# Patient Record
Sex: Female | Born: 1935
Health system: Southern US, Community
[De-identification: ages and names within clinical notes are randomized; demographics above are authoritative.]

## PROBLEM LIST (undated history)

## (undated) DIAGNOSIS — C4922 Malignant neoplasm of connective and soft tissue of left lower limb, including hip: Secondary | ICD-10-CM

## (undated) DIAGNOSIS — D649 Anemia, unspecified: Secondary | ICD-10-CM

## (undated) DIAGNOSIS — N390 Urinary tract infection, site not specified: Secondary | ICD-10-CM

## (undated) DIAGNOSIS — Z860100 Personal history of colon polyps, unspecified: Secondary | ICD-10-CM

## (undated) DIAGNOSIS — I10 Essential (primary) hypertension: Secondary | ICD-10-CM

## (undated) DIAGNOSIS — H269 Unspecified cataract: Secondary | ICD-10-CM

## (undated) DIAGNOSIS — Z923 Personal history of irradiation: Secondary | ICD-10-CM

## (undated) DIAGNOSIS — K219 Gastro-esophageal reflux disease without esophagitis: Secondary | ICD-10-CM

## (undated) DIAGNOSIS — Z5189 Encounter for other specified aftercare: Secondary | ICD-10-CM

## (undated) DIAGNOSIS — Q600 Renal agenesis, unilateral: Secondary | ICD-10-CM

## (undated) DIAGNOSIS — R32 Unspecified urinary incontinence: Secondary | ICD-10-CM

## (undated) DIAGNOSIS — M545 Low back pain, unspecified: Secondary | ICD-10-CM

## (undated) DIAGNOSIS — R569 Unspecified convulsions: Secondary | ICD-10-CM

## (undated) DIAGNOSIS — Z7901 Long term (current) use of anticoagulants: Secondary | ICD-10-CM

## (undated) DIAGNOSIS — K5790 Diverticulosis of intestine, part unspecified, without perforation or abscess without bleeding: Secondary | ICD-10-CM

## (undated) DIAGNOSIS — G5600 Carpal tunnel syndrome, unspecified upper limb: Secondary | ICD-10-CM

## (undated) DIAGNOSIS — D126 Benign neoplasm of colon, unspecified: Secondary | ICD-10-CM

## (undated) DIAGNOSIS — Z86718 Personal history of other venous thrombosis and embolism: Secondary | ICD-10-CM

## (undated) DIAGNOSIS — B009 Herpesviral infection, unspecified: Secondary | ICD-10-CM

## (undated) DIAGNOSIS — Z8619 Personal history of other infectious and parasitic diseases: Secondary | ICD-10-CM

## (undated) DIAGNOSIS — R197 Diarrhea, unspecified: Secondary | ICD-10-CM

## (undated) DIAGNOSIS — I742 Embolism and thrombosis of arteries of the upper extremities: Secondary | ICD-10-CM

## (undated) DIAGNOSIS — I7300 Raynaud's syndrome without gangrene: Secondary | ICD-10-CM

## (undated) DIAGNOSIS — J40 Bronchitis, not specified as acute or chronic: Secondary | ICD-10-CM

## (undated) DIAGNOSIS — M1990 Unspecified osteoarthritis, unspecified site: Secondary | ICD-10-CM

## (undated) DIAGNOSIS — R351 Nocturia: Secondary | ICD-10-CM

## (undated) DIAGNOSIS — IMO0001 Reserved for inherently not codable concepts without codable children: Secondary | ICD-10-CM

## (undated) DIAGNOSIS — E785 Hyperlipidemia, unspecified: Secondary | ICD-10-CM

## (undated) DIAGNOSIS — G4700 Insomnia, unspecified: Secondary | ICD-10-CM

## (undated) DIAGNOSIS — K589 Irritable bowel syndrome without diarrhea: Secondary | ICD-10-CM

## (undated) HISTORY — DX: Long term (current) use of anticoagulants: Z79.01

## (undated) HISTORY — PX: BACK SURGERY: SHX140

## (undated) HISTORY — DX: Diarrhea, unspecified: R19.7

## (undated) HISTORY — DX: Renal agenesis, unilateral: Q60.0

## (undated) HISTORY — PX: LUMBAR LAMINECTOMY: SHX95

## (undated) HISTORY — DX: Insomnia, unspecified: G47.00

## (undated) HISTORY — PX: KNEE SURGERY: SHX244

## (undated) HISTORY — DX: Herpesviral infection, unspecified: B00.9

## (undated) HISTORY — PX: BREAST SURGERY: SHX581

## (undated) HISTORY — PX: APPENDECTOMY: SHX54

## (undated) HISTORY — DX: Personal history of other venous thrombosis and embolism: Z86.718

## (undated) HISTORY — DX: Benign neoplasm of colon, unspecified: D12.6

## (undated) HISTORY — DX: Low back pain, unspecified: M54.50

## (undated) HISTORY — DX: Low back pain: M54.5

## (undated) HISTORY — DX: Essential (primary) hypertension: I10

## (undated) HISTORY — DX: Encounter for other specified aftercare: Z51.89

## (undated) HISTORY — DX: Irritable bowel syndrome, unspecified: K58.9

## (undated) HISTORY — PX: COLONOSCOPY: SHX174

## (undated) HISTORY — DX: Anemia, unspecified: D64.9

## (undated) HISTORY — DX: Hyperlipidemia, unspecified: E78.5

## (undated) HISTORY — PX: CHOLECYSTECTOMY: SHX55

## (undated) HISTORY — DX: Reserved for inherently not codable concepts without codable children: IMO0001

## (undated) HISTORY — DX: Gastro-esophageal reflux disease without esophagitis: K21.9

## (undated) HISTORY — PX: SHOULDER SURGERY: SHX246

## (undated) HISTORY — PX: ABDOMINAL HYSTERECTOMY: SHX81

## (undated) HISTORY — DX: Urinary tract infection, site not specified: N39.0

## (undated) HISTORY — DX: Malignant neoplasm of connective and soft tissue of left lower limb, including hip: C49.22

## (undated) HISTORY — DX: Embolism and thrombosis of arteries of the upper extremities: I74.2

---

## 1957-06-16 HISTORY — PX: ECTOPIC PREGNANCY SURGERY: SHX613

## 1986-06-16 HISTORY — PX: OTHER SURGICAL HISTORY: SHX169

## 1996-06-16 DIAGNOSIS — Z86718 Personal history of other venous thrombosis and embolism: Secondary | ICD-10-CM

## 1996-06-16 HISTORY — DX: Personal history of other venous thrombosis and embolism: Z86.718

## 1998-09-26 ENCOUNTER — Other Ambulatory Visit: Admission: RE | Admit: 1998-09-26 | Discharge: 1998-09-26 | Payer: Self-pay | Admitting: Obstetrics and Gynecology

## 2001-01-19 ENCOUNTER — Other Ambulatory Visit: Admission: RE | Admit: 2001-01-19 | Discharge: 2001-01-19 | Payer: Self-pay | Admitting: Obstetrics and Gynecology

## 2003-11-20 ENCOUNTER — Encounter: Admission: RE | Admit: 2003-11-20 | Discharge: 2003-11-20 | Payer: Self-pay | Admitting: Obstetrics and Gynecology

## 2003-12-25 ENCOUNTER — Encounter (HOSPITAL_COMMUNITY): Admission: RE | Admit: 2003-12-25 | Discharge: 2004-01-02 | Payer: Self-pay | Admitting: General Surgery

## 2004-05-13 ENCOUNTER — Ambulatory Visit: Payer: Self-pay | Admitting: Oncology

## 2004-06-24 ENCOUNTER — Emergency Department (HOSPITAL_COMMUNITY): Admission: EM | Admit: 2004-06-24 | Discharge: 2004-06-24 | Payer: Self-pay | Admitting: *Deleted

## 2004-07-09 ENCOUNTER — Ambulatory Visit: Payer: Self-pay | Admitting: Oncology

## 2004-08-26 ENCOUNTER — Ambulatory Visit: Payer: Self-pay | Admitting: Oncology

## 2004-11-01 ENCOUNTER — Ambulatory Visit: Payer: Self-pay | Admitting: Oncology

## 2004-12-27 ENCOUNTER — Ambulatory Visit: Payer: Self-pay | Admitting: Oncology

## 2005-02-21 ENCOUNTER — Ambulatory Visit: Payer: Self-pay | Admitting: Oncology

## 2005-04-18 ENCOUNTER — Ambulatory Visit: Payer: Self-pay | Admitting: Oncology

## 2005-06-19 ENCOUNTER — Ambulatory Visit: Payer: Self-pay | Admitting: Internal Medicine

## 2005-07-18 ENCOUNTER — Ambulatory Visit: Payer: Self-pay | Admitting: Oncology

## 2005-11-06 ENCOUNTER — Ambulatory Visit: Payer: Self-pay | Admitting: Oncology

## 2005-11-11 LAB — CBC WITH DIFFERENTIAL/PLATELET
Basophils Absolute: 0.1 10*3/uL (ref 0.0–0.1)
Eosinophils Absolute: 0.1 10*3/uL (ref 0.0–0.5)
HCT: 36.2 % (ref 34.8–46.6)
HGB: 12.3 g/dL (ref 11.6–15.9)
MONO#: 0.7 10*3/uL (ref 0.1–0.9)
NEUT%: 51.9 % (ref 39.6–76.8)
WBC: 8 10*3/uL (ref 3.9–10.0)
lymph#: 3 10*3/uL (ref 0.9–3.3)

## 2005-11-11 LAB — PROTIME-INR

## 2006-01-01 ENCOUNTER — Ambulatory Visit: Payer: Self-pay | Admitting: Oncology

## 2006-01-14 LAB — CBC WITH DIFFERENTIAL/PLATELET
Basophils Absolute: 0.1 10*3/uL (ref 0.0–0.1)
EOS%: 0.6 % (ref 0.0–7.0)
HCT: 36.4 % (ref 34.8–46.6)
HGB: 12.6 g/dL (ref 11.6–15.9)
MCH: 34 pg (ref 26.0–34.0)
MCHC: 34.6 g/dL (ref 32.0–36.0)
MCV: 98.4 fL (ref 81.0–101.0)
MONO%: 6.3 % (ref 0.0–13.0)
NEUT%: 61.7 % (ref 39.6–76.8)
RDW: 11.9 % (ref 11.3–14.5)

## 2006-01-28 LAB — PROTIME-INR
INR: 2.2 (ref 2.00–3.50)
Protime: 26.4 Seconds — ABNORMAL HIGH (ref 10.6–13.4)

## 2006-02-26 ENCOUNTER — Ambulatory Visit: Payer: Self-pay | Admitting: Oncology

## 2006-04-30 ENCOUNTER — Encounter: Admission: RE | Admit: 2006-04-30 | Discharge: 2006-04-30 | Payer: Self-pay | Admitting: General Surgery

## 2006-05-08 ENCOUNTER — Ambulatory Visit: Payer: Self-pay | Admitting: Oncology

## 2006-05-12 LAB — CBC WITH DIFFERENTIAL/PLATELET
BASO%: 0.5 % (ref 0.0–2.0)
LYMPH%: 27.5 % (ref 14.0–48.0)
MCHC: 33.8 g/dL (ref 32.0–36.0)
MONO#: 0.7 10*3/uL (ref 0.1–0.9)
Platelets: 263 10*3/uL (ref 145–400)
RBC: 3.57 10*6/uL — ABNORMAL LOW (ref 3.70–5.32)
WBC: 8.8 10*3/uL (ref 3.9–10.0)
lymph#: 2.4 10*3/uL (ref 0.9–3.3)

## 2006-05-12 LAB — PROTIME-INR: INR: 2.8 (ref 2.00–3.50)

## 2006-07-01 ENCOUNTER — Ambulatory Visit: Payer: Self-pay | Admitting: Internal Medicine

## 2006-07-06 ENCOUNTER — Encounter: Payer: Self-pay | Admitting: Internal Medicine

## 2006-08-04 ENCOUNTER — Ambulatory Visit: Payer: Self-pay | Admitting: Internal Medicine

## 2006-08-04 LAB — CONVERTED CEMR LAB
Sed Rate: 19 mm/hr (ref 0–25)
Tissue Transglutaminase Ab, IgA: <3 units (ref <5)

## 2006-08-05 ENCOUNTER — Encounter (INDEPENDENT_AMBULATORY_CARE_PROVIDER_SITE_OTHER): Payer: Self-pay | Admitting: Specialist

## 2006-08-05 ENCOUNTER — Ambulatory Visit: Payer: Self-pay | Admitting: Internal Medicine

## 2006-09-17 ENCOUNTER — Ambulatory Visit: Payer: Self-pay | Admitting: Oncology

## 2006-09-22 LAB — COMPREHENSIVE METABOLIC PANEL
ALT: 8 U/L (ref 0–35)
BUN: 15 mg/dL (ref 6–23)
CO2: 22 mEq/L (ref 19–32)
Creatinine, Ser: 0.93 mg/dL (ref 0.40–1.20)
Total Bilirubin: 0.4 mg/dL (ref 0.3–1.2)

## 2006-09-22 LAB — CBC WITH DIFFERENTIAL/PLATELET
BASO%: 0.3 % (ref 0.0–2.0)
Eosinophils Absolute: 0.1 10*3/uL (ref 0.0–0.5)
LYMPH%: 29.9 % (ref 14.0–48.0)
MCHC: 35 g/dL (ref 32.0–36.0)
MCV: 98.2 fL (ref 81.0–101.0)
MONO%: 7.8 % (ref 0.0–13.0)
NEUT%: 60.8 % (ref 39.6–76.8)
Platelets: 263 10*3/uL (ref 145–400)
RBC: 3.44 10*6/uL — ABNORMAL LOW (ref 3.70–5.32)

## 2006-09-22 LAB — LACTATE DEHYDROGENASE: LDH: 132 U/L (ref 94–250)

## 2006-09-22 LAB — PROTIME-INR: Protime: 28.8 Seconds — ABNORMAL HIGH (ref 10.6–13.4)

## 2006-09-30 ENCOUNTER — Ambulatory Visit: Payer: Self-pay | Admitting: Internal Medicine

## 2006-11-13 ENCOUNTER — Ambulatory Visit: Payer: Self-pay | Admitting: Oncology

## 2006-11-17 LAB — PROTIME-INR: Protime: 24 Seconds — ABNORMAL HIGH (ref 10.6–13.4)

## 2006-11-17 LAB — CBC WITH DIFFERENTIAL/PLATELET
BASO%: 0.7 % (ref 0.0–2.0)
Basophils Absolute: 0 10*3/uL (ref 0.0–0.1)
Eosinophils Absolute: 0.1 10*3/uL (ref 0.0–0.5)
HCT: 36.2 % (ref 34.8–46.6)
HGB: 12.1 g/dL (ref 11.6–15.9)
LYMPH%: 30 % (ref 14.0–48.0)
MCHC: 33.6 g/dL (ref 32.0–36.0)
MONO#: 0.7 10*3/uL (ref 0.1–0.9)
NEUT%: 58.7 % (ref 39.6–76.8)
Platelets: 279 10*3/uL (ref 145–400)
WBC: 6.9 10*3/uL (ref 3.9–10.0)
lymph#: 2.1 10*3/uL (ref 0.9–3.3)

## 2006-12-21 LAB — PROTIME-INR: Protime: 27.6 Seconds — ABNORMAL HIGH (ref 10.6–13.4)

## 2007-01-15 ENCOUNTER — Ambulatory Visit: Payer: Self-pay | Admitting: Oncology

## 2007-01-27 LAB — CBC WITH DIFFERENTIAL/PLATELET
Eosinophils Absolute: 0.1 10*3/uL (ref 0.0–0.5)
LYMPH%: 30.1 % (ref 14.0–48.0)
MCHC: 34.5 g/dL (ref 32.0–36.0)
MCV: 102 fL — ABNORMAL HIGH (ref 81.0–101.0)
MONO%: 8.5 % (ref 0.0–13.0)
NEUT#: 4.8 10*3/uL (ref 1.5–6.5)
NEUT%: 59.6 % (ref 39.6–76.8)
Platelets: 276 10*3/uL (ref 145–400)
RBC: 3.3 10*6/uL — ABNORMAL LOW (ref 3.70–5.32)

## 2007-01-27 LAB — PROTIME-INR: Protime: 32.4 Seconds — ABNORMAL HIGH (ref 10.6–13.4)

## 2007-03-18 ENCOUNTER — Ambulatory Visit: Payer: Self-pay | Admitting: Internal Medicine

## 2007-03-18 ENCOUNTER — Ambulatory Visit: Payer: Self-pay | Admitting: Oncology

## 2007-03-18 DIAGNOSIS — R197 Diarrhea, unspecified: Secondary | ICD-10-CM

## 2007-03-23 DIAGNOSIS — I10 Essential (primary) hypertension: Secondary | ICD-10-CM

## 2007-03-23 DIAGNOSIS — M545 Low back pain: Secondary | ICD-10-CM | POA: Insufficient documentation

## 2007-03-23 DIAGNOSIS — G8929 Other chronic pain: Secondary | ICD-10-CM | POA: Insufficient documentation

## 2007-03-23 DIAGNOSIS — Z86718 Personal history of other venous thrombosis and embolism: Secondary | ICD-10-CM

## 2007-03-23 LAB — COMPREHENSIVE METABOLIC PANEL
ALT: 10 U/L (ref 0–35)
AST: 15 U/L (ref 0–37)
Creatinine, Ser: 0.93 mg/dL (ref 0.40–1.20)
Total Bilirubin: 0.4 mg/dL (ref 0.3–1.2)

## 2007-03-23 LAB — CBC WITH DIFFERENTIAL/PLATELET
Basophils Absolute: 0 10*3/uL (ref 0.0–0.1)
Eosinophils Absolute: 0.1 10*3/uL (ref 0.0–0.5)
HCT: 35 % (ref 34.8–46.6)
LYMPH%: 37.1 % (ref 14.0–48.0)
MCV: 100.6 fL (ref 81.0–101.0)
MONO%: 10 % (ref 0.0–13.0)
NEUT#: 3.2 10*3/uL (ref 1.5–6.5)
NEUT%: 51.5 % (ref 39.6–76.8)
Platelets: 298 10*3/uL (ref 145–400)
RBC: 3.48 10*6/uL — ABNORMAL LOW (ref 3.70–5.32)

## 2007-03-23 LAB — LACTATE DEHYDROGENASE: LDH: 148 U/L (ref 94–250)

## 2007-04-29 ENCOUNTER — Ambulatory Visit: Payer: Self-pay | Admitting: Internal Medicine

## 2007-05-12 ENCOUNTER — Ambulatory Visit: Payer: Self-pay | Admitting: Oncology

## 2007-06-24 ENCOUNTER — Ambulatory Visit: Payer: Self-pay | Admitting: Oncology

## 2007-06-28 LAB — CBC WITH DIFFERENTIAL/PLATELET
Basophils Absolute: 0 10*3/uL (ref 0.0–0.1)
EOS%: 0.8 % (ref 0.0–7.0)
Eosinophils Absolute: 0.1 10*3/uL (ref 0.0–0.5)
HCT: 32.4 % — ABNORMAL LOW (ref 34.8–46.6)
HGB: 11.4 g/dL — ABNORMAL LOW (ref 11.6–15.9)
MCH: 34.8 pg — ABNORMAL HIGH (ref 26.0–34.0)
MCV: 98.8 fL (ref 81.0–101.0)
NEUT%: 62.9 % (ref 39.6–76.8)
lymph#: 2.1 10*3/uL (ref 0.9–3.3)

## 2007-06-28 LAB — PROTIME-INR

## 2007-08-04 ENCOUNTER — Encounter: Payer: Self-pay | Admitting: Internal Medicine

## 2007-09-03 ENCOUNTER — Ambulatory Visit: Payer: Self-pay | Admitting: Oncology

## 2007-09-21 ENCOUNTER — Ambulatory Visit: Payer: Self-pay | Admitting: Internal Medicine

## 2007-09-21 DIAGNOSIS — K219 Gastro-esophageal reflux disease without esophagitis: Secondary | ICD-10-CM

## 2007-10-29 ENCOUNTER — Ambulatory Visit: Payer: Self-pay | Admitting: Oncology

## 2007-11-06 ENCOUNTER — Emergency Department (HOSPITAL_COMMUNITY): Admission: EM | Admit: 2007-11-06 | Discharge: 2007-11-06 | Payer: Self-pay | Admitting: Emergency Medicine

## 2007-12-23 ENCOUNTER — Ambulatory Visit: Payer: Self-pay | Admitting: Oncology

## 2007-12-28 LAB — CBC WITH DIFFERENTIAL/PLATELET
BASO%: 0.9 % (ref 0.0–2.0)
MCHC: 32.9 g/dL (ref 32.0–36.0)
MONO#: 0.7 10*3/uL (ref 0.1–0.9)
RBC: 3.33 10*6/uL — ABNORMAL LOW (ref 3.70–5.32)
RDW: 12.4 % (ref 11.3–14.5)
WBC: 9.5 10*3/uL (ref 3.9–10.0)
lymph#: 2.6 10*3/uL (ref 0.9–3.3)

## 2007-12-28 LAB — PROTIME-INR
INR: 2.7 (ref 2.00–3.50)
Protime: 32.4 Seconds — ABNORMAL HIGH (ref 10.6–13.4)

## 2008-02-16 ENCOUNTER — Ambulatory Visit: Payer: Self-pay | Admitting: Oncology

## 2008-02-22 ENCOUNTER — Ambulatory Visit: Payer: Self-pay | Admitting: Internal Medicine

## 2008-02-22 DIAGNOSIS — R930 Abnormal findings on diagnostic imaging of skull and head, not elsewhere classified: Secondary | ICD-10-CM

## 2008-02-22 DIAGNOSIS — J209 Acute bronchitis, unspecified: Secondary | ICD-10-CM

## 2008-02-22 LAB — PROTIME-INR: Protime: 27.6 Seconds — ABNORMAL HIGH (ref 10.6–13.4)

## 2008-03-01 ENCOUNTER — Telehealth: Payer: Self-pay | Admitting: Internal Medicine

## 2008-03-21 LAB — COMPREHENSIVE METABOLIC PANEL
ALT: 10 U/L (ref 0–35)
AST: 13 U/L (ref 0–37)
Albumin: 4 g/dL (ref 3.5–5.2)
CO2: 22 mEq/L (ref 19–32)
Calcium: 9.2 mg/dL (ref 8.4–10.5)
Chloride: 105 mEq/L (ref 96–112)
Creatinine, Ser: 0.9 mg/dL (ref 0.40–1.20)
Potassium: 4.5 mEq/L (ref 3.5–5.3)
Sodium: 136 mEq/L (ref 135–145)
Total Protein: 6.7 g/dL (ref 6.0–8.3)

## 2008-03-21 LAB — CBC WITH DIFFERENTIAL/PLATELET
BASO%: 0.6 % (ref 0.0–2.0)
Eosinophils Absolute: 0.1 10*3/uL (ref 0.0–0.5)
MCHC: 34.5 g/dL (ref 32.0–36.0)
MONO#: 0.6 10*3/uL (ref 0.1–0.9)
NEUT#: 3 10*3/uL (ref 1.5–6.5)
RBC: 3.29 10*6/uL — ABNORMAL LOW (ref 3.70–5.32)
RDW: 13.1 % (ref 11.3–14.5)
WBC: 5.6 10*3/uL (ref 3.9–10.0)
lymph#: 2 10*3/uL (ref 0.9–3.3)

## 2008-03-21 LAB — ERYTHROCYTE SEDIMENTATION RATE: Sed Rate: 16 mm/hr (ref 0–30)

## 2008-03-21 LAB — LACTATE DEHYDROGENASE: LDH: 133 U/L (ref 94–250)

## 2008-03-22 ENCOUNTER — Ambulatory Visit: Payer: Self-pay | Admitting: Internal Medicine

## 2008-03-22 DIAGNOSIS — R21 Rash and other nonspecific skin eruption: Secondary | ICD-10-CM | POA: Insufficient documentation

## 2008-04-07 ENCOUNTER — Encounter: Payer: Self-pay | Admitting: Internal Medicine

## 2008-04-17 ENCOUNTER — Ambulatory Visit: Payer: Self-pay | Admitting: Oncology

## 2008-04-19 LAB — CBC WITH DIFFERENTIAL/PLATELET
BASO%: 1.1 % (ref 0.0–2.0)
Eosinophils Absolute: 0.1 10*3/uL (ref 0.0–0.5)
LYMPH%: 31.5 % (ref 14.0–48.0)
MCHC: 33.2 g/dL (ref 32.0–36.0)
MCV: 100 fL (ref 81.0–101.0)
MONO%: 14 % — ABNORMAL HIGH (ref 0.0–13.0)
NEUT#: 4.2 10*3/uL (ref 1.5–6.5)
Platelets: 258 10*3/uL (ref 145–400)
RBC: 3.73 10*6/uL (ref 3.70–5.32)
RDW: 12.2 % (ref 11.3–14.5)
WBC: 8 10*3/uL (ref 3.9–10.0)

## 2008-04-19 LAB — PROTIME-INR
INR: 2.1 (ref 2.00–3.50)
Protime: 25.2 Seconds — ABNORMAL HIGH (ref 10.6–13.4)

## 2008-06-01 ENCOUNTER — Telehealth: Payer: Self-pay | Admitting: Internal Medicine

## 2008-06-01 ENCOUNTER — Ambulatory Visit: Payer: Self-pay | Admitting: Internal Medicine

## 2008-06-05 ENCOUNTER — Ambulatory Visit: Payer: Self-pay | Admitting: Oncology

## 2008-06-12 LAB — CBC WITH DIFFERENTIAL/PLATELET
Basophils Absolute: 0.1 10*3/uL (ref 0.0–0.1)
EOS%: 2.3 % (ref 0.0–7.0)
LYMPH%: 29.6 % (ref 14.0–48.0)
MCH: 33.6 pg (ref 26.0–34.0)
MCV: 101 fL (ref 81.0–101.0)
MONO%: 8.8 % (ref 0.0–13.0)
Platelets: 318 10*3/uL (ref 145–400)
RBC: 3.51 10*6/uL — ABNORMAL LOW (ref 3.70–5.32)
RDW: 12.7 % (ref 11.3–14.5)

## 2008-06-12 LAB — PROTIME-INR
INR: 1.3 — ABNORMAL LOW (ref 2.00–3.50)
Protime: 15.6 Seconds — ABNORMAL HIGH (ref 10.6–13.4)

## 2008-06-15 ENCOUNTER — Emergency Department (HOSPITAL_COMMUNITY): Admission: EM | Admit: 2008-06-15 | Discharge: 2008-06-15 | Payer: Self-pay | Admitting: Emergency Medicine

## 2008-06-15 LAB — PROTIME-INR

## 2008-06-20 ENCOUNTER — Encounter: Payer: Self-pay | Admitting: Internal Medicine

## 2008-06-20 LAB — COMPREHENSIVE METABOLIC PANEL
Albumin: 3.9 g/dL (ref 3.5–5.2)
CO2: 24 mEq/L (ref 19–32)
Calcium: 8.7 mg/dL (ref 8.4–10.5)
Chloride: 101 mEq/L (ref 96–112)
Glucose, Bld: 85 mg/dL (ref 70–99)
Potassium: 4.2 mEq/L (ref 3.5–5.3)
Sodium: 134 mEq/L — ABNORMAL LOW (ref 135–145)
Total Protein: 6.6 g/dL (ref 6.0–8.3)

## 2008-06-20 LAB — PROTIME-INR
INR: 1.7 — ABNORMAL LOW (ref 2.00–3.50)
Protime: 20.4 Seconds — ABNORMAL HIGH (ref 10.6–13.4)

## 2008-06-23 LAB — PROTIME-INR
INR: 2 (ref 2.00–3.50)
Protime: 24 Seconds — ABNORMAL HIGH (ref 10.6–13.4)

## 2008-07-13 ENCOUNTER — Ambulatory Visit: Payer: Self-pay | Admitting: Oncology

## 2008-07-17 LAB — PROTIME-INR
INR: 3.7 — ABNORMAL HIGH (ref 2.00–3.50)
Protime: 44.4 Seconds — ABNORMAL HIGH (ref 10.6–13.4)

## 2008-08-01 LAB — CBC WITH DIFFERENTIAL/PLATELET
Basophils Absolute: 0 10*3/uL (ref 0.0–0.1)
EOS%: 1.2 % (ref 0.0–7.0)
HCT: 34.7 % — ABNORMAL LOW (ref 34.8–46.6)
HGB: 12 g/dL (ref 11.6–15.9)
MCH: 34.3 pg — ABNORMAL HIGH (ref 26.0–34.0)
MCHC: 34.5 g/dL (ref 32.0–36.0)
MCV: 99.3 fL (ref 81.0–101.0)
MONO%: 6.3 % (ref 0.0–13.0)
NEUT%: 61.3 % (ref 39.6–76.8)

## 2008-08-01 LAB — PROTIME-INR: INR: 4.2 — ABNORMAL HIGH (ref 2.00–3.50)

## 2008-08-11 LAB — PROTIME-INR

## 2008-08-14 LAB — CBC WITH DIFFERENTIAL/PLATELET
Eosinophils Absolute: 0.1 10*3/uL (ref 0.0–0.5)
HCT: 35.7 % (ref 34.8–46.6)
LYMPH%: 44.1 % (ref 14.0–49.7)
MCHC: 33.9 g/dL (ref 31.5–36.0)
MCV: 96.2 fL (ref 79.5–101.0)
MONO%: 10.1 % (ref 0.0–14.0)
NEUT#: 2.7 10*3/uL (ref 1.5–6.5)
NEUT%: 44 % (ref 38.4–76.8)
Platelets: 255 10*3/uL (ref 145–400)
RBC: 3.71 10*6/uL (ref 3.70–5.45)
nRBC: 0 % (ref 0–0)

## 2008-08-14 LAB — PROTIME-INR: Protime: 18 Seconds — ABNORMAL HIGH (ref 10.6–13.4)

## 2008-08-16 ENCOUNTER — Encounter: Payer: Self-pay | Admitting: Internal Medicine

## 2008-08-22 LAB — PROTIME-INR
INR: 2 (ref 2.00–3.50)
Protime: 24 Seconds — ABNORMAL HIGH (ref 10.6–13.4)

## 2008-08-29 ENCOUNTER — Ambulatory Visit: Payer: Self-pay | Admitting: Oncology

## 2008-08-29 LAB — PROTIME-INR: INR: 1.8 — ABNORMAL LOW (ref 2.00–3.50)

## 2008-08-31 ENCOUNTER — Encounter: Payer: Self-pay | Admitting: Internal Medicine

## 2008-09-05 LAB — CBC WITH DIFFERENTIAL/PLATELET
BASO%: 0.5 % (ref 0.0–2.0)
EOS%: 1.6 % (ref 0.0–7.0)
MCH: 33.4 pg (ref 25.1–34.0)
MCHC: 33.5 g/dL (ref 31.5–36.0)
MCV: 99.7 fL (ref 79.5–101.0)
MONO%: 8.6 % (ref 0.0–14.0)
NEUT%: 50.7 % (ref 38.4–76.8)
RDW: 13.8 % (ref 11.2–14.5)
lymph#: 2.6 10*3/uL (ref 0.9–3.3)

## 2008-09-05 LAB — PROTIME-INR: INR: 2.7 (ref 2.00–3.50)

## 2008-09-12 LAB — PROTIME-INR

## 2008-09-20 ENCOUNTER — Ambulatory Visit: Payer: Self-pay | Admitting: Internal Medicine

## 2008-09-20 DIAGNOSIS — R209 Unspecified disturbances of skin sensation: Secondary | ICD-10-CM | POA: Insufficient documentation

## 2008-09-20 DIAGNOSIS — G4700 Insomnia, unspecified: Secondary | ICD-10-CM | POA: Insufficient documentation

## 2008-09-21 LAB — CONVERTED CEMR LAB
AST: 26 units/L (ref 0–37)
Albumin: 4.2 g/dL (ref 3.5–5.2)
Basophils Relative: 0.7 % (ref 0.0–3.0)
Bilirubin, Direct: 0.2 mg/dL (ref 0.0–0.3)
CO2: 24 meq/L (ref 19–32)
Creatinine, Ser: 1.2 mg/dL (ref 0.4–1.2)
Eosinophils Absolute: 0.1 10*3/uL (ref 0.0–0.7)
GFR calc non Af Amer: 46.79 mL/min (ref >60)
Glucose, Bld: 92 mg/dL (ref 70–99)
HCT: 38.3 % (ref 36.0–46.0)
Hemoglobin: 13 g/dL (ref 12.0–15.0)
Ketones, ur: NEGATIVE mg/dL
Leukocytes, UA: NEGATIVE
Lymphs Abs: 3.2 10*3/uL (ref 0.7–4.0)
MCV: 99.8 fL (ref 78.0–100.0)
Monocytes Absolute: 0.8 10*3/uL (ref 0.1–1.0)
Monocytes Relative: 8.9 % (ref 3.0–12.0)
Neutro Abs: 5.2 10*3/uL (ref 1.4–7.7)
Nitrite: NEGATIVE
Platelets: 286 10*3/uL (ref 150.0–400.0)
RBC: 3.84 M/uL — ABNORMAL LOW (ref 3.87–5.11)
Sed Rate: 29 mm/hr — ABNORMAL HIGH (ref 0–22)
Total Bilirubin: 1 mg/dL (ref 0.3–1.2)
Total Protein, Urine: NEGATIVE mg/dL
Urine Glucose: NEGATIVE mg/dL
Vitamin B-12: 311 pg/mL (ref 211–911)
WBC: 9.4 10*3/uL (ref 4.5–10.5)

## 2008-10-03 LAB — PROTIME-INR

## 2008-10-11 LAB — PROTIME-INR: Protime: 31.2 Seconds — ABNORMAL HIGH (ref 10.6–13.4)

## 2008-10-15 ENCOUNTER — Telehealth: Payer: Self-pay | Admitting: Family Medicine

## 2008-10-16 ENCOUNTER — Ambulatory Visit: Payer: Self-pay | Admitting: Oncology

## 2008-10-23 LAB — PROTIME-INR

## 2008-10-26 LAB — PROTIME-INR

## 2008-10-31 LAB — PROTIME-INR
INR: 1.4 — ABNORMAL LOW (ref 2.00–3.50)
Protime: 16.8 Seconds — ABNORMAL HIGH (ref 10.6–13.4)

## 2008-11-06 LAB — PROTIME-INR
INR: 1.8 — ABNORMAL LOW (ref 2.00–3.50)
Protime: 21.6 Seconds — ABNORMAL HIGH (ref 10.6–13.4)

## 2008-11-10 LAB — PROTIME-INR: INR: 2.5 (ref 2.00–3.50)

## 2008-11-15 LAB — PROTIME-INR

## 2008-11-24 LAB — PROTIME-INR

## 2008-12-01 ENCOUNTER — Ambulatory Visit: Payer: Self-pay | Admitting: Oncology

## 2008-12-05 LAB — CBC WITH DIFFERENTIAL/PLATELET
Basophils Absolute: 0 10*3/uL (ref 0.0–0.1)
Eosinophils Absolute: 0.1 10*3/uL (ref 0.0–0.5)
HCT: 34.7 % — ABNORMAL LOW (ref 34.8–46.6)
HGB: 11.9 g/dL (ref 11.6–15.9)
LYMPH%: 34.2 % (ref 14.0–49.7)
MONO#: 0.7 10*3/uL (ref 0.1–0.9)
NEUT#: 4.5 10*3/uL (ref 1.5–6.5)
NEUT%: 55.1 % (ref 38.4–76.8)
Platelets: 258 10*3/uL (ref 145–400)
WBC: 8.2 10*3/uL (ref 3.9–10.3)
lymph#: 2.8 10*3/uL (ref 0.9–3.3)

## 2008-12-05 LAB — PROTIME-INR

## 2008-12-19 LAB — PROTIME-INR: INR: 2.8 (ref 2.00–3.50)

## 2009-01-04 ENCOUNTER — Ambulatory Visit: Payer: Self-pay | Admitting: Oncology

## 2009-01-09 LAB — PROTIME-INR
INR: 4.2 — ABNORMAL HIGH (ref 2.00–3.50)
Protime: 50.4 Seconds — ABNORMAL HIGH (ref 10.6–13.4)

## 2009-01-18 LAB — CBC WITH DIFFERENTIAL/PLATELET
BASO%: 0.6 % (ref 0.0–2.0)
EOS%: 1.3 % (ref 0.0–7.0)
HCT: 37.1 % (ref 34.8–46.6)
LYMPH%: 42.2 % (ref 14.0–49.7)
MCH: 33.3 pg (ref 25.1–34.0)
MCHC: 34 g/dL (ref 31.5–36.0)
NEUT%: 45.8 % (ref 38.4–76.8)
Platelets: 257 10*3/uL (ref 145–400)

## 2009-02-01 LAB — PROTIME-INR

## 2009-02-06 ENCOUNTER — Ambulatory Visit: Payer: Self-pay | Admitting: Oncology

## 2009-02-08 LAB — PROTIME-INR
INR: 2.4 (ref 2.00–3.50)
Protime: 28.8 Seconds — ABNORMAL HIGH (ref 10.6–13.4)

## 2009-02-26 LAB — PROTIME-INR: Protime: 30 Seconds — ABNORMAL HIGH (ref 10.6–13.4)

## 2009-03-09 ENCOUNTER — Ambulatory Visit: Payer: Self-pay | Admitting: Oncology

## 2009-03-09 LAB — PROTIME-INR: Protime: 37.2 Seconds — ABNORMAL HIGH (ref 10.6–13.4)

## 2009-03-20 ENCOUNTER — Encounter: Payer: Self-pay | Admitting: Internal Medicine

## 2009-03-20 LAB — CBC WITH DIFFERENTIAL/PLATELET
Basophils Absolute: 0 10*3/uL (ref 0.0–0.1)
Eosinophils Absolute: 0.1 10*3/uL (ref 0.0–0.5)
HCT: 38 % (ref 34.8–46.6)
HGB: 12.7 g/dL (ref 11.6–15.9)
LYMPH%: 45.4 % (ref 14.0–49.7)
MONO#: 0.6 10*3/uL (ref 0.1–0.9)
NEUT#: 2.8 10*3/uL (ref 1.5–6.5)
NEUT%: 43 % (ref 38.4–76.8)
Platelets: 283 10*3/uL (ref 145–400)
WBC: 6.5 10*3/uL (ref 3.9–10.3)
lymph#: 2.9 10*3/uL (ref 0.9–3.3)

## 2009-03-20 LAB — PROTIME-INR: INR: 3.5 (ref 2.00–3.50)

## 2009-03-27 ENCOUNTER — Ambulatory Visit: Payer: Self-pay | Admitting: Internal Medicine

## 2009-03-27 DIAGNOSIS — J309 Allergic rhinitis, unspecified: Secondary | ICD-10-CM | POA: Insufficient documentation

## 2009-04-10 ENCOUNTER — Ambulatory Visit: Payer: Self-pay | Admitting: Oncology

## 2009-04-10 LAB — PROTIME-INR: Protime: 30 Seconds — ABNORMAL HIGH (ref 10.6–13.4)

## 2009-05-11 ENCOUNTER — Ambulatory Visit: Payer: Self-pay | Admitting: Oncology

## 2009-06-14 ENCOUNTER — Ambulatory Visit: Payer: Self-pay | Admitting: Oncology

## 2009-06-19 LAB — CBC WITH DIFFERENTIAL/PLATELET
BASO%: 0.6 % (ref 0.0–2.0)
EOS%: 1.4 % (ref 0.0–7.0)
HGB: 12.5 g/dL (ref 11.6–15.9)
LYMPH%: 39 % (ref 14.0–49.7)
MONO#: 0.7 10*3/uL (ref 0.1–0.9)
MONO%: 7.6 % (ref 0.0–14.0)
RDW: 13.1 % (ref 11.2–14.5)
WBC: 8.7 10*3/uL (ref 3.9–10.3)

## 2009-06-19 LAB — PROTIME-INR: INR: 3 (ref 2.00–3.50)

## 2009-06-19 LAB — TECHNOLOGIST REVIEW

## 2009-07-30 ENCOUNTER — Ambulatory Visit: Payer: Self-pay | Admitting: Oncology

## 2009-07-31 LAB — PROTIME-INR: INR: 2.2 (ref 2.00–3.50)

## 2009-09-17 ENCOUNTER — Ambulatory Visit: Payer: Self-pay | Admitting: Internal Medicine

## 2009-09-18 LAB — CONVERTED CEMR LAB
ALT: 20 units/L (ref 0–35)
Albumin: 4 g/dL (ref 3.5–5.2)
Alkaline Phosphatase: 50 units/L (ref 39–117)
CO2: 28 meq/L (ref 19–32)
Cholesterol: 276 mg/dL — ABNORMAL HIGH (ref 0–200)
Direct LDL: 179.3 mg/dL
Eosinophils Relative: 2.2 % (ref 0.0–5.0)
GFR calc non Af Amer: 51.6 mL/min (ref >60)
Glucose, Bld: 87 mg/dL (ref 70–99)
HDL: 58.3 mg/dL (ref >39.00)
Lymphocytes Relative: 38.4 % (ref 12.0–46.0)
Lymphs Abs: 2.6 10*3/uL (ref 0.7–4.0)
MCV: 99.2 fL (ref 78.0–100.0)
Monocytes Absolute: 0.7 10*3/uL (ref 0.1–1.0)
Neutrophils Relative %: 49.5 % (ref 43.0–77.0)
Potassium: 4.7 meq/L (ref 3.5–5.1)
Sodium: 139 meq/L (ref 135–145)
Specific Gravity, Urine: 1.02 (ref 1.000–1.030)
TSH: 3.84 microintl units/mL (ref 0.35–5.50)
Total CHOL/HDL Ratio: 5
Total Protein: 7 g/dL (ref 6.0–8.3)
Urobilinogen, UA: 0.2 (ref 0.0–1.0)
WBC: 6.9 10*3/uL (ref 4.5–10.5)
pH: 5.5 (ref 5.0–8.0)

## 2009-09-20 ENCOUNTER — Ambulatory Visit: Payer: Self-pay | Admitting: Internal Medicine

## 2009-09-20 DIAGNOSIS — E785 Hyperlipidemia, unspecified: Secondary | ICD-10-CM

## 2009-09-20 DIAGNOSIS — R42 Dizziness and giddiness: Secondary | ICD-10-CM

## 2009-09-21 ENCOUNTER — Ambulatory Visit: Payer: Self-pay | Admitting: Oncology

## 2009-09-25 ENCOUNTER — Telehealth: Payer: Self-pay | Admitting: Internal Medicine

## 2009-09-25 LAB — PROTIME-INR
INR: 2.2 (ref 2.00–3.50)
Protime: 26.4 Seconds — ABNORMAL HIGH (ref 10.6–13.4)

## 2009-10-04 ENCOUNTER — Encounter: Payer: Self-pay | Admitting: Internal Medicine

## 2009-11-16 ENCOUNTER — Ambulatory Visit: Payer: Self-pay | Admitting: Oncology

## 2009-11-20 LAB — CBC WITH DIFFERENTIAL/PLATELET
Basophils Absolute: 0 10*3/uL (ref 0.0–0.1)
EOS%: 1.2 % (ref 0.0–7.0)
Eosinophils Absolute: 0.1 10*3/uL (ref 0.0–0.5)
HGB: 12.6 g/dL (ref 11.6–15.9)
MCV: 99.7 fL (ref 79.5–101.0)
MONO%: 9.8 % (ref 0.0–14.0)
NEUT#: 3.8 10*3/uL (ref 1.5–6.5)
RBC: 3.76 10*6/uL (ref 3.70–5.45)
RDW: 13.1 % (ref 11.2–14.5)
lymph#: 2.3 10*3/uL (ref 0.9–3.3)
nRBC: 0 % (ref 0–0)

## 2009-11-20 LAB — PROTIME-INR
INR: 2.9 (ref 2.00–3.50)
Protime: 34.8 Seconds — ABNORMAL HIGH (ref 10.6–13.4)

## 2009-12-10 ENCOUNTER — Encounter: Payer: Self-pay | Admitting: Internal Medicine

## 2009-12-24 ENCOUNTER — Ambulatory Visit: Payer: Self-pay | Admitting: Internal Medicine

## 2009-12-24 LAB — CONVERTED CEMR LAB
ALT: 20 units/L (ref 0–35)
Alkaline Phosphatase: 54 units/L (ref 39–117)
Cholesterol: 182 mg/dL (ref 0–200)
Total Bilirubin: 0.7 mg/dL (ref 0.3–1.2)
Triglycerides: 193 mg/dL — ABNORMAL HIGH (ref 0.0–149.0)

## 2009-12-27 ENCOUNTER — Telehealth: Payer: Self-pay | Admitting: Internal Medicine

## 2010-01-11 ENCOUNTER — Ambulatory Visit: Payer: Self-pay | Admitting: Oncology

## 2010-01-14 ENCOUNTER — Telehealth: Payer: Self-pay | Admitting: Internal Medicine

## 2010-02-07 LAB — PROTIME-INR
INR: 2.4 (ref 2.00–3.50)
Protime: 28.8 Seconds — ABNORMAL HIGH (ref 10.6–13.4)

## 2010-02-25 ENCOUNTER — Telehealth: Payer: Self-pay | Admitting: Internal Medicine

## 2010-03-14 ENCOUNTER — Ambulatory Visit: Payer: Self-pay | Admitting: Oncology

## 2010-03-18 ENCOUNTER — Encounter: Payer: Self-pay | Admitting: Internal Medicine

## 2010-03-18 LAB — COMPREHENSIVE METABOLIC PANEL
ALT: 16 U/L (ref 0–35)
AST: 21 U/L (ref 0–37)
Albumin: 4.4 g/dL (ref 3.5–5.2)
BUN: 18 mg/dL (ref 6–23)
Potassium: 4.9 mEq/L (ref 3.5–5.3)
Total Protein: 7 g/dL (ref 6.0–8.3)

## 2010-03-18 LAB — LACTATE DEHYDROGENASE: LDH: 175 U/L (ref 94–250)

## 2010-03-18 LAB — CBC WITH DIFFERENTIAL/PLATELET
Basophils Absolute: 0 10*3/uL (ref 0.0–0.1)
EOS%: 0.6 % (ref 0.0–7.0)
MCH: 34.8 pg — ABNORMAL HIGH (ref 25.1–34.0)
MCHC: 34.1 g/dL (ref 31.5–36.0)
MCV: 102 fL — ABNORMAL HIGH (ref 79.5–101.0)
lymph#: 2 10*3/uL (ref 0.9–3.3)

## 2010-03-18 LAB — PROTIME-INR: INR: 1.8 — ABNORMAL LOW (ref 2.00–3.50)

## 2010-03-21 ENCOUNTER — Ambulatory Visit: Payer: Self-pay | Admitting: Internal Medicine

## 2010-03-21 LAB — CONVERTED CEMR LAB
ALT: 19 units/L (ref 0–35)
AST: 27 units/L (ref 0–37)
Albumin: 3.9 g/dL (ref 3.5–5.2)
Alkaline Phosphatase: 54 units/L (ref 39–117)
BUN: 22 mg/dL (ref 6–23)
CO2: 27 meq/L (ref 19–32)
Calcium: 9.4 mg/dL (ref 8.4–10.5)
Chloride: 101 meq/L (ref 96–112)
Eosinophils Absolute: 0.1 10*3/uL (ref 0.0–0.7)
Eosinophils Relative: 1.5 % (ref 0.0–5.0)
GFR calc non Af Amer: 58.87 mL/min (ref >60)
Glucose, Bld: 86 mg/dL (ref 70–99)
Hemoglobin: 12.9 g/dL (ref 12.0–15.0)
Monocytes Relative: 9.3 % (ref 3.0–12.0)
Neutrophils Relative %: 50.3 % (ref 43.0–77.0)
Nitrite: NEGATIVE
Platelets: 299 10*3/uL (ref 150.0–400.0)
Potassium: 4.6 meq/L (ref 3.5–5.1)
RDW: 13.4 % (ref 11.5–14.6)
Sodium: 136 meq/L (ref 135–145)
Specific Gravity, Urine: 1.025 (ref 1.000–1.030)
Total CHOL/HDL Ratio: 4
Urine Glucose: NEGATIVE mg/dL
Urobilinogen, UA: 0.2 (ref 0.0–1.0)
VLDL: 33.8 mg/dL (ref 0.0–40.0)
pH: 5 (ref 5.0–8.0)

## 2010-03-26 ENCOUNTER — Telehealth: Payer: Self-pay | Admitting: Internal Medicine

## 2010-03-26 ENCOUNTER — Ambulatory Visit: Payer: Self-pay | Admitting: Internal Medicine

## 2010-03-27 ENCOUNTER — Ambulatory Visit: Payer: Self-pay | Admitting: Internal Medicine

## 2010-04-02 LAB — PROTIME-INR: Protime: 39.6 Seconds — ABNORMAL HIGH (ref 10.6–13.4)

## 2010-04-16 ENCOUNTER — Ambulatory Visit: Payer: Self-pay | Admitting: Oncology

## 2010-04-16 LAB — PROTIME-INR: Protime: 43.2 Seconds — ABNORMAL HIGH (ref 10.6–13.4)

## 2010-04-30 DIAGNOSIS — Z860100 Personal history of colon polyps, unspecified: Secondary | ICD-10-CM | POA: Insufficient documentation

## 2010-04-30 DIAGNOSIS — K5730 Diverticulosis of large intestine without perforation or abscess without bleeding: Secondary | ICD-10-CM | POA: Insufficient documentation

## 2010-05-06 ENCOUNTER — Ambulatory Visit: Payer: Self-pay | Admitting: Internal Medicine

## 2010-05-13 ENCOUNTER — Encounter: Payer: Self-pay | Admitting: Internal Medicine

## 2010-05-13 LAB — PROTIME-INR
INR: 2.6 (ref 2.00–3.50)
Protime: 31.2 Seconds — ABNORMAL HIGH (ref 10.6–13.4)

## 2010-05-16 ENCOUNTER — Ambulatory Visit (HOSPITAL_COMMUNITY)
Admission: RE | Admit: 2010-05-16 | Discharge: 2010-05-16 | Payer: Self-pay | Source: Home / Self Care | Admitting: Internal Medicine

## 2010-05-24 ENCOUNTER — Ambulatory Visit: Payer: Self-pay | Admitting: Oncology

## 2010-05-28 LAB — PROTIME-INR: INR: 2.9 (ref 2.00–3.50)

## 2010-06-28 ENCOUNTER — Ambulatory Visit: Payer: Self-pay | Admitting: Oncology

## 2010-07-02 LAB — PROTIME-INR
INR: 1.7 — ABNORMAL LOW (ref 2.00–3.50)
Protime: 20.4 Seconds — ABNORMAL HIGH (ref 10.6–13.4)

## 2010-07-02 LAB — CBC WITH DIFFERENTIAL/PLATELET
BASO%: 0.6 % (ref 0.0–2.0)
Basophils Absolute: 0 10*3/uL (ref 0.0–0.1)
EOS%: 1.7 % (ref 0.0–7.0)
Eosinophils Absolute: 0.1 10*3/uL (ref 0.0–0.5)
HCT: 37.7 % (ref 34.8–46.6)
HGB: 12.8 g/dL (ref 11.6–15.9)
LYMPH%: 42.4 % (ref 14.0–49.7)
MCH: 33.3 pg (ref 25.1–34.0)
MCHC: 34 g/dL (ref 31.5–36.0)
MCV: 98.2 fL (ref 79.5–101.0)
MONO#: 0.6 10*3/uL (ref 0.1–0.9)
MONO%: 9.5 % (ref 0.0–14.0)
NEUT#: 2.9 10*3/uL (ref 1.5–6.5)
NEUT%: 45.8 % (ref 38.4–76.8)
Platelets: 283 10*3/uL (ref 145–400)
RBC: 3.84 10*6/uL (ref 3.70–5.45)
RDW: 12.6 % (ref 11.2–14.5)
WBC: 6.3 10*3/uL (ref 3.9–10.3)
lymph#: 2.7 10*3/uL (ref 0.9–3.3)
nRBC: 0 % (ref 0–0)

## 2010-07-09 LAB — PROTIME-INR

## 2010-07-16 NOTE — Assessment & Plan Note (Signed)
Summary: 6 MO ROV /NWS  #   Vital Signs:  Patient profile:   77 year old female Height:      64 inches Weight:      144 pounds BMI:     24.81 Temp:     97.0 degrees F oral Pulse rate:   84 / minute Pulse rhythm:   regular Resp:     16 per minute BP sitting:   126 / 74  (left arm) Cuff size:   regular  Vitals Entered By: Lanier Prude, CMA(AAMA) (March 26, 2010 10:03 AM) CC: 6 mo f/u Is Patient Diabetic? No   Primary Care Provider:  Tresa Garter MD  CC:  6 mo f/u.  History of Present Illness: The patient presents for a follow up of hypertension, diarrhea (worse x 1 month; no night time stools; 12 a day) hyperlipidemia, LBP, anticoagulation. C/o stress  Current Medications (verified): 1)  Zolpidem Tartrate 10 Mg  Tabs (Zolpidem Tartrate) .Marland Kitchen.. 1at Hs, Can Repeat 1/2 or 1 By Mouth in 4 H If Needed 2)  Coumadin 5 Mg Tabs (Warfarin Sodium) .... As Directed By [facility] Clinic 3)  Lomotil 2.5-0.025 Mg Tabs (Diphenoxylate-Atropine) .Marland Kitchen.. 1-2 Po Two Times Daily As Needed 4)  Enalapril Maleate 20 Mg  Tabs (Enalapril Maleate) .Marland Kitchen.. 1 Once Daily 5)  Valtrex 500 Mg Tabs (Valacyclovir Hcl) .Marland Kitchen.. 1 Once Daily 6)  Tramadol Hcl 50 Mg  Tabs (Tramadol Hcl) .Marland Kitchen.. 1or2 Two Times A Day  Prn 7)  Furosemide 20 Mg Tabs (Furosemide) .Marland Kitchen.. 1 Once Daily As Needed 8)  Potassium Chloride 20 Meq  Pack (Potassium Chloride) .Marland Kitchen.. 1 Once Daily As Needed With Furosemide 9)  Vitamin D3 1000 Unit  Tabs (Cholecalciferol) .Marland Kitchen.. 1 By Mouth Daily 10)  Epipen 0.3 Mg/0.41ml (1:1000)  Devi (Epinephrine Hcl (Anaphylaxis)) .... Use As Needed 11)  Fluticasone Propionate 50 Mcg/act  Susp (Fluticasone Propionate) .... 2 Sprays Each Nostril Once Daily 12)  Lipitor 10 Mg Tabs (Atorvastatin Calcium) .Marland Kitchen.. 1 By Mouth Once Daily For Cholesterol 13)  Tylenol Extra Strength 500 Mg Tabs (Acetaminophen) .... As Needed For Headache  Allergies (verified): 1)  ! Demerol (Meperidine Hcl) 2)  Norvasc 3)  Penicillin G Potassium  (Penicillin G Potassium) 4)  Sulfamethoxazole (Sulfamethoxazole) 5)  Aspirin (Aspirin) 6)  Benadryl (Diphenhydramine Hcl) 7)  Codeine Phosphate (Codeine Phosphate) 8)  Gentamicin Sulfate (Gentamicin Sulfate) 9)  * Opaque Dye 10)  Questran Light (Cholestyramine Light) 11)  Avelox 12)  Doxycycline 13)  Meclizine Hcl (Meclizine Hcl) 14)  Metronidazole (Metronidazole)  Past History:  Family History: Last updated: 09/20/2009 OA No CAD B CHF  Social History: Last updated: 03/18/2007 Retired Divorced Never Smoked  Past Medical History: Low back pain Hypertension Anticoagulation therapy Dr Cyndie Chime DVT, hx of GERD mild Insomnia Herpes R shoulder surg 2010 Dr Rennis Chris Gyn Dr Marcelle Overlie Allergic rhinitis Hyperlipidemia refusing Statins Diarrhea off and on x 6 years Colonic polyps, hx of  Past Surgical History: Cholecystectomy Hysterectomy Lumbar laminectomy x 2 Colon polypectomy Dr Juanda Chance  Review of Systems  The patient denies weight loss, syncope, hematochezia, and muscle weakness.         diarrhea in daytime  Physical Exam  General:  Well-developed,well-nourished,in no acute distress; alert,appropriate and cooperative throughout examination Nose:  External nasal examination shows no deformity or inflammation. Nasal mucosa are pink and moist without lesions or exudates. Mouth:  Oral mucosa and oropharynx without lesions or exudates.  Teeth in good repair. Lungs:  Normal respiratory effort, chest expands  symmetrically. Lungs are clear to auscultation, no crackles or wheezes. Heart:  Normal rate and regular rhythm. S1 and S2 normal without gallop, murmur, click, rub or other extra sounds. Abdomen:  Bowel sounds positive,abdomen soft and non-tender without masses, organomegaly or hernias noted. Msk:  Lumbar-sacral spine is tender to palpation over paraspinal muscles and painfull with the ROM  Extremities:  No clubbing, edema, or deformity noted with normal full range  of motion of all joints.  Cold feet Neurologic:  No cranial nerve deficits noted. Station and gait are normal. Plantar reflexes are down-going bilaterally. DTRs are symmetrical throughout. Sensory, motor and coordinative functions appear intact. Skin:  Intact without suspicious lesions or rashes Psych:  Cognition and judgment appear intact. Alert and cooperative with normal attention span and concentration. No apparent delusions, illusions, hallucinations   Impression & Recommendations:  Problem # 1:  DIARRHEA, CHRONIC (ICD-787.91) unclear etiol Assessment Deteriorated Trial of Welchol Her updated medication list for this problem includes:    Lomotil 2.5-0.025 Mg Tabs (Diphenoxylate-atropine) .Marland Kitchen... 1-2 po two times daily as needed    Align Caps (Probiotic product) .Marland Kitchen... 1 by mouth once daily for your intesinal flora restoraion  Orders: Gastroenterology Referral (GI)  Problem # 2:  HYPERLIPIDEMIA (ICD-272.4) Assessment: Comment Only  Her updated medication list for this problem includes:    Lipitor 10 Mg Tabs (Atorvastatin calcium) .Marland Kitchen... 1 by mouth once daily for cholesterol    Welchol 3.75 Gm Pack (Colesevelam hcl) .Marland Kitchen... 1 by mouth qd  Problem # 3:  ANTICOAGULATION THERAPY (ICD-V58.61) Assessment: Unchanged On the regimen of medicine(s) reflected in the chart    Problem # 4:  HYPERTENSION (ICD-401.9) Assessment: Unchanged  Her updated medication list for this problem includes:    Enalapril Maleate 20 Mg Tabs (Enalapril maleate) .Marland Kitchen... 1 once daily    Furosemide 20 Mg Tabs (Furosemide) .Marland Kitchen... 1 once daily as needed  BP today: 126/74 Prior BP: 120/78 (09/20/2009)  Labs Reviewed: K+: 4.6 (03/21/2010) Creat: : 1.0 (03/21/2010)   Chol: 215 (03/21/2010)   HDL: 59.80 (03/21/2010)   LDL: 85 (12/24/2009)   TG: 169.0 (03/21/2010)  Problem # 5:  LOW BACK PAIN (ICD-724.2) Assessment: Unchanged  Her updated medication list for this problem includes:    Tramadol Hcl 50 Mg Tabs  (Tramadol hcl) .Marland Kitchen... 1or2 two times a day  prn    Tylenol Extra Strength 500 Mg Tabs (Acetaminophen) .Marland Kitchen... As needed for headache  Complete Medication List: 1)  Zolpidem Tartrate 10 Mg Tabs (Zolpidem tartrate) .Marland Kitchen.. 1at hs, can repeat 1/2 or 1 by mouth in 4 h if needed 2)  Coumadin 5 Mg Tabs (Warfarin sodium) .... As directed by coumadin clinic 3)  Lomotil 2.5-0.025 Mg Tabs (Diphenoxylate-atropine) .Marland Kitchen.. 1-2 po two times daily as needed 4)  Enalapril Maleate 20 Mg Tabs (Enalapril maleate) .Marland Kitchen.. 1 once daily 5)  Valtrex 500 Mg Tabs (Valacyclovir hcl) .Marland Kitchen.. 1 once daily 6)  Tramadol Hcl 50 Mg Tabs (Tramadol hcl) .Marland Kitchen.. 1or2 two times a day  prn 7)  Furosemide 20 Mg Tabs (Furosemide) .Marland Kitchen.. 1 once daily as needed 8)  Potassium Chloride 20 Meq Pack (Potassium chloride) .Marland Kitchen.. 1 once daily as needed with furosemide 9)  Vitamin D3 1000 Unit Tabs (Cholecalciferol) .Marland Kitchen.. 1 by mouth daily 10)  Epipen 0.3 Mg/0.21ml (1:1000) Devi (Epinephrine hcl (anaphylaxis)) .... Use as needed 11)  Fluticasone Propionate 50 Mcg/act Susp (Fluticasone propionate) .... 2 sprays each nostril once daily 12)  Lipitor 10 Mg Tabs (Atorvastatin calcium) .Marland Kitchen.. 1 by mouth once  daily for cholesterol 13)  Tylenol Extra Strength 500 Mg Tabs (Acetaminophen) .... As needed for headache 14)  Align Caps (Probiotic product) .Marland Kitchen.. 1 by mouth once daily for your intesinal flora restoraion 15)  Welchol 3.75 Gm Pack (Colesevelam hcl) .Marland Kitchen.. 1 by mouth qd  Patient Instructions: 1)  Please schedule a follow-up appointment in 4 months well w/labs. 2)  Call if you are not better in a reasonable amount of time or if worse.  Prescriptions: FLUTICASONE PROPIONATE 50 MCG/ACT  SUSP (FLUTICASONE PROPIONATE) 2 sprays each nostril once daily  #3 x 3   Entered and Authorized by:   Tresa Garter MD   Signed by:   Tresa Garter MD on 03/26/2010   Method used:   Print then Give to Patient   RxID:   [id number] LIPITOR 10 MG TABS (ATORVASTATIN  CALCIUM) 1 by mouth once daily for cholesterol  #90 x 3   Entered and Authorized by:   Tresa Garter MD   Signed by:   Tresa Garter MD on 03/26/2010   Method used:   Print then Give to Patient   RxID:   [id number] TRAMADOL HCL 50 MG  TABS (TRAMADOL HCL) 1or2 two times a day  prn  #90 x 1   Entered and Authorized by:   Tresa Garter MD   Signed by:   Tresa Garter MD on 03/26/2010   Method used:   Print then Give to Patient   RxID:   [id number] VALTREX 500 MG TABS (VALACYCLOVIR HCL) 1 once daily  #90 x 3   Entered and Authorized by:   Tresa Garter MD   Signed by:   Tresa Garter MD on 03/26/2010   Method used:   Print then Give to Patient   RxID:   [id number] ENALAPRIL MALEATE 20 MG  TABS (ENALAPRIL MALEATE) 1 once daily  #90 x 3   Entered and Authorized by:   Tresa Garter MD   Signed by:   Tresa Garter MD on 03/26/2010   Method used:   Print then Give to Patient   RxID:   [id number] LOMOTIL 2.5-0.025 MG TABS (DIPHENOXYLATE-ATROPINE) 1-2 po two times daily as needed  #60 x 2   Entered and Authorized by:   Tresa Garter MD   Signed by:   Tresa Garter MD on 03/26/2010   Method used:   Print then Give to Patient   RxID:   [id number] ZOLPIDEM TARTRATE 10 MG  TABS (ZOLPIDEM TARTRATE) 1at hs, can repeat 1/2 or 1 by mouth in 4 h if needed  #135 x 1   Entered and Authorized by:   Tresa Garter MD   Signed by:   Tresa Garter MD on 03/26/2010   Method used:   Print then Give to Patient   RxID:   [id number] WELCHOL 3.75 GM PACK (COLESEVELAM HCL) 1 by mouth qd  #30 x 12   Entered and Authorized by:   Tresa Garter MD   Signed by:   Tresa Garter MD on 03/26/2010   Method used:   Print then Give to Patient   RxID:   [id number] ALIGN  CAPS (PROBIOTIC PRODUCT) 1 by mouth once daily for your intesinal flora restoraion  #30 x 1   Entered and Authorized by:   Tresa Garter MD   Signed by:   Tresa Garter MD on 03/26/2010   Method used:   Print then  Give to Patient   RxID:   [id number]

## 2010-07-16 NOTE — Letter (Signed)
Summary: Screening/[facility]  Screening/[facility]   Imported By: Sherian Rein 09/26/2009 08:40:10  _____________________________________________________________________  External Attachment:    Type:   Image     Comment:   External Document

## 2010-07-16 NOTE — Progress Notes (Signed)
Summary: Rf gen Lomotil  Phone Note Refill Request Message from:  Fax from Pharmacy  Refills Requested: Medication #1:  LOMOTIL 2.5-0.025 MG TABS 1-2 po bid prn   Supply Requested: 60   Last Refilled: 09/18/2009  Method Requested: Telephone to Pharmacy Initial call taken by: Lanier Prude, [facility]),  January 14, 2010 12:06 PM  Follow-up for Phone Call        ok 3 ref Follow-up by: Tresa Garter MD,  January 14, 2010 5:49 PM  Additional Follow-up for Phone Call Additional follow up Details #1::        Rx called to pharmacy Additional Follow-up by: Lanier Prude, [facility]),  January 15, 2010 11:58 AM    Prescriptions: LOMOTIL 2.5-0.025 MG TABS (DIPHENOXYLATE-ATROPINE) 1-2 po bid prn  #60 x 3   Entered by:   Lanier Prude, CMA(AAMA)   Authorized by:   Tresa Garter MD   Signed by:   Lanier Prude, CMA(AAMA) on 01/15/2010   Method used:   Telephoned to ...       [facility]* (retail)       [address]       [place], [place]  [id number]       Ph: [phone]       Fax: [phone]   RxID:   [id number]

## 2010-07-16 NOTE — Progress Notes (Signed)
Summary: LIPITOR  Phone Note Call from Patient Call back at Home Phone [phone] Call back at [phone]/OK vm on both#'s   Summary of Call: Pt was seen today at cancer center. INR 2.2, stable x approx 1 year per pt. She would like to start Lipitor as discussed at last office visit. Per pt, pharmacist at cancer center was agreeable to lipitor also but Dr Posey Rea needs to be main prescriber.   *pt aware that Dr is out until end of the week.  Initial call taken by: Lamar Sprinkles, CMA,  September 25, 2009 11:09 AM  Follow-up for Phone Call        OK. Labs in 3 months - lipids and LFTs 272.20 Follow-up by: Tresa Garter MD,  September 25, 2009 2:38 PM  Additional Follow-up for Phone Call Additional follow up Details #1::        Pt informed. Please put lab order in idx. [facility] Additional Follow-up by: Lamar Sprinkles, CMA,  September 25, 2009 5:25 PM    Additional Follow-up for Phone Call Additional follow up Details #2::    labs in [facility] for July. Follow-up by: Verdell Face,  September 26, 2009 8:57 AM  New/Updated Medications: LIPITOR 10 MG TABS (ATORVASTATIN CALCIUM) 1 by mouth once daily for cholesterol Prescriptions: LIPITOR 10 MG TABS (ATORVASTATIN CALCIUM) 1 by mouth once daily for cholesterol  #30 x 12   Entered and Authorized by:   Tresa Garter MD   Signed by:   Lamar Sprinkles, CMA on 09/25/2009   Method used:   Electronically to        [facility]* (retail)       [address]       [place], [place]  [id number]       Ph: [phone]       Fax: [phone]   RxID:   [id number]

## 2010-07-16 NOTE — Assessment & Plan Note (Signed)
Summary: diarrhea/sheri    History of Present Illness Visit Type: new patient  Primary GI MD: Lina Sar MD Primary Provider: Tresa Garter MD Requesting Provider: na Chief Complaint: Diarrhea and abd cramping  History of Present Illness:   Patient is a 77 year old female who was worked up by Dr. Juanda Chance in 2008 for diarrhea. She has continued to have 4-5 BMs a day varying in consistency from solid to loose. Diarrhea worse in am after breakfast. No nocturnal diarrhea. Last week had severe diarrhea with up to 18 loose stools a day associated with cramping. She typically doesn't have cramping.  Her diarrhea has  iimproved but still having around seven episodes a day. No recent antibiotics. No sick contacts. Weight stable. After excessive diarrhea sometimes sees specks of bright red blood on tissue. She has decreased caffeine and greasy food intake. PCP started her on Align yesterday.  Also gave her Welchol but [facility]madin Clinic felt that it would interfere with Coumadin. Takes four Lomotil four times a day and has done so for a year now. Patient, having discovered that diarrhea is prominent side effect of Lipitor, briefly held medication for a week or so and diarrhea improved. She restarted Lipitor and diarrhea returned.    GI Review of Systems    Reports belching and  nausea.      Denies abdominal pain, acid reflux, bloating, chest pain, dysphagia with solids, heartburn, loss of appetite, vomiting, vomiting blood, weight loss, and  weight gain.      Reports diarrhea, fecal incontinence, and  irritable bowel syndrome.     Denies anal fissure, black tarry stools, change in bowel habit, constipation, diverticulosis, heme positive stool, hemorrhoids, jaundice, light color stool, liver problems, rectal bleeding, and  rectal pain.    Current Medications (verified): 1)  Zolpidem Tartrate 10 Mg  Tabs (Zolpidem Tartrate) .Marland Kitchen.. 1at Hs, Can Repeat 1/2 or 1 By Mouth in 4 H If Needed 2)  Coumadin 5 Mg  Tabs (Warfarin Sodium) .... As Directed By Coumadin Clinic 3)  Lomotil 2.5-0.025 Mg Tabs (Diphenoxylate-Atropine) .Marland Kitchen.. 1-2 Po Two Times Daily As Needed 4)  Enalapril Maleate 20 Mg  Tabs (Enalapril Maleate) .Marland Kitchen.. 1 Once Daily 5)  Valtrex 500 Mg Tabs (Valacyclovir Hcl) .Marland Kitchen.. 1 Once Daily 6)  Tramadol Hcl 50 Mg  Tabs (Tramadol Hcl) .Marland Kitchen.. 1or2 Two Times A Day  Prn 7)  Furosemide 20 Mg Tabs (Furosemide) .Marland Kitchen.. 1 Once Daily As Needed 8)  Potassium Chloride 20 Meq  Pack (Potassium Chloride) .Marland Kitchen.. 1 Once Daily As Needed With Furosemide 9)  Vitamin D3 1000 Unit  Tabs (Cholecalciferol) .Marland Kitchen.. 1 By Mouth Daily 10)  Epipen 0.3 Mg/0.51ml (1:1000)  Devi (Epinephrine Hcl (Anaphylaxis)) .... Use As Needed 11)  Fluticasone Propionate 50 Mcg/act  Susp (Fluticasone Propionate) .... 2 Sprays Each Nostril Once Daily 12)  Lipitor 10 Mg Tabs (Atorvastatin Calcium) .Marland Kitchen.. 1 By Mouth Once Daily For Cholesterol 13)  Tylenol Extra Strength 500 Mg Tabs (Acetaminophen) .... As Needed For Headache 14)  Align  Caps (Probiotic Product) .Marland Kitchen.. 1 By Mouth Once Daily For Your Intesinal Flora Restoraion(Have Not Started) 15)  Welchol 3.75 Gm Pack (Colesevelam Hcl) .Marland Kitchen.. 1 By Mouth Qd  Allergies (verified): 1)  ! Demerol (Meperidine Hcl) 2)  Norvasc 3)  Penicillin G Potassium (Penicillin G Potassium) 4)  Sulfamethoxazole (Sulfamethoxazole) 5)  Aspirin (Aspirin) 6)  Benadryl (Diphenhydramine Hcl) 7)  Codeine Phosphate (Codeine Phosphate) 8)  Gentamicin Sulfate (Gentamicin Sulfate) 9)  * Opaque Dye 10)  Questran  Light (Cholestyramine Light) 11)  Avelox 12)  Doxycycline 13)  Meclizine Hcl (Meclizine Hcl) 14)  Metronidazole (Metronidazole)  Past History:  Past Medical History: Low back pain Hypertension Anticoagulation therapy Dr Cyndie Chime DVT, hx of GERD mild Insomnia Herpes R shoulder surg 2010 Dr Rennis Chris Gyn Dr Marcelle Overlie Allergic rhinitis Hyperlipidemia refusing Statins Diarrhea off and on x 6 years Colonic polyps, hx  of Irritable Bowel Syndrome Urinary Tract Infection  Past Surgical History: Cholecystectomy Hysterectomy Lumbar laminectomy x 2 Colon polypectomy Dr Juanda Chance Appendectomy Left Knee Surgery Right Shoulder Surgery   Family History: OA No CAD B CHF No FH of Colon Cancer: Family History of Breast Cancer:Sister  Social History: Retired Married Childern Patient has never smoked.  Alcohol Use - yes: 2.3 daily Daily Caffeine Use: 1 daily  Illicit Drug Use - no Drug Use:  no  Review of Systems       The patient complains of allergy/sinus, fatigue, muscle pains/cramps, and urine leakage.  The patient denies anemia, anxiety-new, arthritis/joint pain, back pain, blood in urine, breast changes/lumps, change in vision, confusion, cough, coughing up blood, depression-new, fainting, fever, headaches-new, hearing problems, heart murmur, heart rhythm changes, itching, menstrual pain, night sweats, nosebleeds, pregnancy symptoms, shortness of breath, skin rash, sleeping problems, sore throat, swelling of feet/legs, swollen lymph glands, thirst - excessive , urination - excessive , urination changes/pain, vision changes, and voice change.    Vital Signs:  Patient profile:   77 year old female Height:      64 inches Weight:      144 pounds BMI:     24.81 BSA:     1.70 Pulse rate:   96 / minute Pulse rhythm:   regular BP sitting:   132 / 76  (left arm) Cuff size:   regular  Vitals Entered By: Ok Anis CMA (March 27, 2010 9:35 AM)  Physical Exam  General:  Well developed, well nourished, no acute distress. Head:  Normocephalic and atraumatic. Eyes:  Conjunctiva pink, no icterus.  Mouth:  No oral lesions. Tongue moist.  Neck:  no obvious masses  Lungs:  Clear throughout to auscultation. Heart:  Regular rate and rhythm; no murmurs, rubs,  or bruits. Abdomen:  Abdomen soft, nontender, nondistended. No obvious masses or hepatomegaly.Normal bowel sounds.  Msk:  Symmetrical with  no gross deformities. Normal posture. Extremities:  No palmar erythema, no edema.  Neurologic:  Alert and  oriented x4;  grossly normal neurologically. Skin:  Intact without significant lesions or rashes. Cervical Nodes:  No significant cervical adenopathy. Psych:  Alert and cooperative. Normal mood and affect.   Impression & Recommendations:  Problem # 1:  DIARRHEA, CHRONIC (ICD-787.91) Assessment Deteriorated Diarrhea has progressed since last visit here in 2008 causing significantly diminished quality of life. Workup in 2008 with stool studies, TTG, colonoscopy with random biopsies was negative. She may have severe IBS but medication is certainly possible, especially since temporarily holding Lipitor did stop the diarrhea. Recommend continuation of Lomotil four times a day but add Loperamide 2mg  before each meal. She will try two pills if going out for meal.  Follow up with Dr. Juanda Chance in 6-8 weeks.   Problem # 2:  ANTICOAGULATION THERAPY (ICD-V58.61) Assessment: Comment Only Chronic Coumadin for history of DVTs  Problem # 3:  COLONIC POLYPS, HX OF (ICD-V12.72) Assessment: Comment Only Adenomatous cecal polyp on 2008 colonoscopy. Polyp wasn't removed, just biopsed (on Coumadin). Will defer timing of next colonoscopy to Dr. Juanda Chance. Patient will need Lovenox or Heparin bridge  for procedure.   Patient Instructions: 1)  Continue the Align capsules , 1 by mouth daily. 2)  Hold the Welchol at this time. 3)  Take Generic Imodium (Loperamide) , Take 1 tab before meals up to 3 a day. 4)  If you are going out to eat a meal,  take 2 tab 30 minutes before eating.  5)  We made you a follow up appointment with Dr. Lina Sar for 05-06-10 at 8:45 AM.  6)  Copy sent to : Georgina Quint. Plognikov, MD 7)                         Cephas Darby, MD

## 2010-07-16 NOTE — Letter (Signed)
Summary: [facility]  [facility]   Imported By: Lennie Odor 04/16/2010 09:16:08  _____________________________________________________________________  External Attachment:    Type:   Image     Comment:   External Document

## 2010-07-16 NOTE — Assessment & Plan Note (Signed)
Summary: 6 MO ROV /NWS #   Vital Signs:  Patient profile:   77 year old female Height:      64 inches (162.56 cm) Weight:      153.8 pounds (69.91 kg) BMI:     26.50 O2 Sat:      98 % on Room air Temp:     97.0 degrees F (36.11 degrees C) oral Pulse rate:   88 / minute BP sitting:   120 / 78  (left arm) Cuff size:   regular  Vitals Entered By: Orlan Leavens (September 20, 2009 10:18 AM)  O2 Flow:  Room air CC: 6 month follow-up Is Patient Diabetic? No Pain Assessment Patient in pain? no        Primary Care Provider:  Georgina Quint Charmayne Odell MD  CC:  6 month follow-up.  History of Present Illness: The patient presents for a follow up of back pain, anxiety, depression and headaches. C/o dizziness. She was dx'd w/Menier's disease by Dr Brion Aliment a few years ago. Dr Valentina Lucks then disagreed w/this Dx.  Current Medications (verified): 1)  Zolpidem Tartrate 10 Mg  Tabs (Zolpidem Tartrate) .Marland Kitchen.. 1at Hs, Can Repeat 1/2 or 1 By Mouth in 4 H If Needed 2)  Coumadin 5 Mg Tabs (Warfarin Sodium) .... As Dirrected 3)  Lomotil 2.5-0.025 Mg Tabs (Diphenoxylate-Atropine) .Marland Kitchen.. 1-2 Po Bid Prn 4)  Enalapril Maleate 20 Mg  Tabs (Enalapril Maleate) .Marland Kitchen.. 1 Once Daily 5)  Valtrex 500 Mg Tabs (Valacyclovir Hcl) .Marland Kitchen.. 1 Qd 6)  Tramadol Hcl 50 Mg  Tabs (Tramadol Hcl) .Marland Kitchen.. 1or2 Two Times A Day  Prn 7)  Furosemide 20 Mg Tabs (Furosemide) .Marland Kitchen.. 1 Qam Prn 8)  Potassium Chloride 20 Meq  Pack (Potassium Chloride) .... As Needed Qd 9)  Vitamin D3 1000 Unit  Tabs (Cholecalciferol) .Marland Kitchen.. 1 By Mouth Daily 10)  Epipen 0.3 Mg/0.94ml (1:1000)  Devi (Epinephrine Hcl (Anaphylaxis)) .... Use As Needed 11)  Fluticasone Propionate 50 Mcg/act  Susp (Fluticasone Propionate) .... 2 Sprays Each Nostril Once Daily  Allergies (verified): 1)  ! Demerol (Meperidine Hcl) 2)  Norvasc 3)  Penicillin G Potassium (Penicillin G Potassium) 4)  Sulfamethoxazole (Sulfamethoxazole) 5)  Aspirin (Aspirin) 6)  Benadryl (Diphenhydramine Hcl) 7)   Codeine Phosphate (Codeine Phosphate) 8)  Gentamicin Sulfate (Gentamicin Sulfate) 9)  * Opaque Dye 10)  Questran Light (Cholestyramine Light) 11)  Avelox 12)  Doxycycline 13)  Meclizine Hcl (Meclizine Hcl)  Past History:  Family History: Last updated: 09/20/2009 OA No CAD B CHF  Social History: Last updated: 03/18/2007 Retired Divorced Never Smoked  Past Medical History: Low back pain Hypertension Anticoagulation therapy Dr Cyndie Chime DVT, hx of GERD mild Insomnia Herpes R shoulder surg 2010 Dr Rennis Chris Gyn Dr Marcelle Overlie Allergic rhinitis Hyperlipidemia refusing Statins  Family History: OA No CAD B CHF  Social History: Reviewed history from 03/18/2007 and no changes required. Retired Divorced Never Smoked  Review of Systems  The patient denies fever, chest pain, dyspnea on exertion, and abdominal pain.    Physical Exam  General:  Well-developed,well-nourished,in no acute distress; alert,appropriate and cooperative throughout examination Nose:  External nasal examination shows no deformity or inflammation. Nasal mucosa are pink and moist without lesions or exudates. Mouth:  Oral mucosa and oropharynx without lesions or exudates.  Teeth in good repair. Lungs:  Normal respiratory effort, chest expands symmetrically. Lungs are clear to auscultation, no crackles or wheezes. Heart:  Normal rate and regular rhythm. S1 and S2 normal without gallop, murmur, click, rub  or other extra sounds. Abdomen:  Bowel sounds positive,abdomen soft and non-tender without masses, organomegaly or hernias noted. Msk:  Lumbar-sacral spine is tender to palpation over paraspinal muscles and painfull with the ROM  Extremities:  No clubbing, edema, or deformity noted with normal full range of motion of all joints.  Cold feet Neurologic:  No cranial nerve deficits noted. Station and gait are normal. Plantar reflexes are down-going bilaterally. DTRs are symmetrical throughout. Sensory, motor  and coordinative functions appear intact. Skin:  Intact without suspicious lesions or rashes Psych:  Cognition and judgment appear intact. Alert and cooperative with normal attention span and concentration. No apparent delusions, illusions, hallucinations   Impression & Recommendations:  Problem # 1:  HYPERTENSION (ICD-401.9) Assessment Unchanged  Her updated medication list for this problem includes:    Enalapril Maleate 20 Mg Tabs (Enalapril maleate) .Marland Kitchen... 1 once daily    Furosemide 20 Mg Tabs (Furosemide) .Marland Kitchen... 1 qam prn  BP today: 120/78 Prior BP: 130/78 (03/27/2009)  Labs Reviewed: K+: 4.7 (09/17/2009) Creat: : 1.1 (09/17/2009)   Chol: 276 (09/17/2009)   HDL: 58.30 (09/17/2009)   TG: 207.0 (09/17/2009)  Problem # 2:  VERTIGO (ICD-780.4) Assessment: Unchanged Handicapped temp as needed (as a passenger - when dizzy) Orders: Prescription Created Electronically [phone])  Problem # 3:  ALLERGIC RHINITIS (ICD-477.9) Assessment: Unchanged  Her updated medication list for this problem includes:    Fluticasone Propionate 50 Mcg/act Susp (Fluticasone propionate) .Marland Kitchen... 2 sprays each nostril once daily  Problem # 4:  INSOMNIA, PERSISTENT (ICD-307.42) Assessment: Improved  Problem # 5:  HYPERLIPIDEMIA (ICD-272.4) Assessment: Unchanged  Refused statins  Labs Reviewed: SGOT: 23 (09/17/2009)   SGPT: 20 (09/17/2009)   HDL:58.30 (09/17/2009)  Chol:276 (09/17/2009)  Trig:207.0 (09/17/2009)  Complete Medication List: 1)  Zolpidem Tartrate 10 Mg Tabs (Zolpidem tartrate) .Marland Kitchen.. 1at hs, can repeat 1/2 or 1 by mouth in 4 h if needed 2)  Coumadin 5 Mg Tabs (Warfarin sodium) .... As dirrected 3)  Lomotil 2.5-0.025 Mg Tabs (Diphenoxylate-atropine) .Marland Kitchen.. 1-2 po bid prn 4)  Enalapril Maleate 20 Mg Tabs (Enalapril maleate) .Marland Kitchen.. 1 once daily 5)  Valtrex 500 Mg Tabs (Valacyclovir hcl) .Marland Kitchen.. 1 qd 6)  Tramadol Hcl 50 Mg Tabs (Tramadol hcl) .Marland Kitchen.. 1or2 two times a day  prn 7)  Furosemide 20 Mg Tabs  (Furosemide) .Marland Kitchen.. 1 qam prn 8)  Potassium Chloride 20 Meq Pack (Potassium chloride) .... As needed qd 9)  Vitamin D3 1000 Unit Tabs (Cholecalciferol) .Marland Kitchen.. 1 by mouth daily 10)  Epipen 0.3 Mg/0.54ml (1:1000) Devi (Epinephrine hcl (anaphylaxis)) .... Use as needed 11)  Fluticasone Propionate 50 Mcg/act Susp (Fluticasone propionate) .... 2 sprays each nostril once daily  Other Orders: Pneumococcal Vaccine (96295) Admin 1st Vaccine (28413) Admin 1st Vaccine [facility]) [id number])  Patient Instructions: 1)  Please schedule a follow-up appointment in 6 months well w/labs. Prescriptions: POTASSIUM CHLORIDE 20 MEQ  PACK (POTASSIUM CHLORIDE) as needed qd  #90 x 1   Entered and Authorized by:   Tresa Garter MD   Signed by:   Tresa Garter MD on 09/20/2009   Method used:   Print then Give to Patient   RxID:   [id number] ZOLPIDEM TARTRATE 10 MG  TABS (ZOLPIDEM TARTRATE) 1at hs, can repeat 1/2 or 1 by mouth in 4 h if needed  #135 x 1   Entered and Authorized by:   Tresa Garter MD   Signed by:   Tresa Garter MD on 09/20/2009   Method used:  Print then Give to Patient   RxID:   [id number] FLUTICASONE PROPIONATE 50 MCG/ACT  SUSP (FLUTICASONE PROPIONATE) 2 sprays each nostril once daily  #3 x 3   Entered and Authorized by:   Tresa Garter MD   Signed by:   Tresa Garter MD on 09/20/2009   Method used:   Electronically to        [facility]* (retail)       [address]       [place], [place]  [address]       Ph: [phone]       Fax: [phone]   RxID:   [id number] EPIPEN 0.3 MG/0.3ML (1:1000)  DEVI (EPINEPHRINE HCL (ANAPHYLAXIS)) use as needed  #3 x 1   Entered and Authorized by:   Tresa Garter MD   Signed by:   Tresa Garter MD on 09/20/2009   Method used:   Electronically to        [facility]* (retail)       [address]       [place], [place]  [address]       Ph: [phone]       Fax:  [phone]   RxID:   [id number] TRAMADOL HCL 50 MG  TABS (TRAMADOL HCL) 1or2 two times a day  prn  #90 x 1   Entered and Authorized by:   Tresa Garter MD   Signed by:   Tresa Garter MD on 09/20/2009   Method used:   Electronically to        [facility]* (retail)       [address]       [place], [place]  [address]       Ph: [phone]       Fax: [phone]   RxID:   [id number] VALTREX 500 MG TABS (VALACYCLOVIR HCL) 1 qd  #90 x 3   Entered and Authorized by:   Tresa Garter MD   Signed by:   Tresa Garter MD on 09/20/2009   Method used:   Electronically to        [facility]* (retail)       [address]       [place], [place]  [address]       Ph: [phone]       Fax: [phone]   RxID:   [id number] ENALAPRIL MALEATE 20 MG  TABS (ENALAPRIL MALEATE) 1 once daily  #90 x 3   Entered and Authorized by:   Tresa Garter MD   Signed by:   Tresa Garter MD on 09/20/2009   Method used:   Electronically to        [facility]* (retail)       [address]       [place], [place]  [address]       Ph: [phone]       Fax: [phone]   RxID:   [id number]    Pneumovax Vaccine    Vaccine Type: Pneumovax    Site: left deltoid    Mfr: Merck    Dose: 0.5 ml    Route: IM    Given by: Orlan Leavens    Exp. Date: 01/28/2011    Lot #: [id number]    VIS given: 09/20/09

## 2010-07-16 NOTE — Assessment & Plan Note (Signed)
Summary: Diarrhea, saw Lafe Garin RNP    History of Present Illness Visit Type: Follow-up Visit Primary GI MD: Lina Sar MD Primary Provider: Tresa Garter MD Requesting Provider: na Chief Complaint: F/u for diarrhea, and abd cramping. Pt states that abd cramping is getting better and diarrhea is better but she has to take alot of Imodium  History of Present Illness:   This is a 77 year old white female with chronic diarrhea. The onset of diarrhea dates back to  her 28 cholecystectomy. She had a recent flare up of severe diarrhea having 16 bowel movements a day and at night.  Colonoscopy in 2008 showed no evidence of colitis. Her tissue transglutaminase was negative. She takes up to 6 Lomotil and 3 Imodium a day. Her weight has been stable. She has occasional crampy abdominal pain but usually no pain. There has been no blood in her stools.   GI Review of Systems      Denies abdominal pain, acid reflux, belching, bloating, chest pain, dysphagia with liquids, dysphagia with solids, heartburn, loss of appetite, nausea, vomiting, vomiting blood, weight loss, and  weight gain.        Denies anal fissure, black tarry stools, change in bowel habit, constipation, diarrhea, diverticulosis, fecal incontinence, heme positive stool, hemorrhoids, irritable bowel syndrome, jaundice, light color stool, liver problems, rectal bleeding, and  rectal pain.    Current Medications (verified): 1)  Zolpidem Tartrate 10 Mg  Tabs (Zolpidem Tartrate) .Marland Kitchen.. 1at Hs, Can Repeat 1/2 or 1 By Mouth in 4 H If Needed 2)  Coumadin 5 Mg Tabs (Warfarin Sodium) .... 7.5 Mg By Mouth On Monday. Rest of Week Will Take 5mg  By Mouth 3)  Lomotil 2.5-0.025 Mg Tabs (Diphenoxylate-Atropine) .Marland Kitchen.. 1-2 Po Two Times Daily As Needed 4)  Enalapril Maleate 20 Mg  Tabs (Enalapril Maleate) .Marland Kitchen.. 1 Once Daily 5)  Valtrex 500 Mg Tabs (Valacyclovir Hcl) .Marland Kitchen.. 1 Once Daily 6)  Tramadol Hcl 50 Mg  Tabs (Tramadol Hcl) .Marland Kitchen.. 1or2 Two Times A  Day  Prn 7)  Furosemide 20 Mg Tabs (Furosemide) .Marland Kitchen.. 1 Once Daily As Needed 8)  Potassium Chloride 20 Meq  Pack (Potassium Chloride) .Marland Kitchen.. 1 Once Daily As Needed With Furosemide 9)  Vitamin D3 1000 Unit  Tabs (Cholecalciferol) .Marland Kitchen.. 1 By Mouth Daily 10)  Epipen 0.3 Mg/0.69ml (1:1000)  Devi (Epinephrine Hcl (Anaphylaxis)) .... Use As Needed 11)  Fluticasone Propionate 50 Mcg/act  Susp (Fluticasone Propionate) .... 2 Sprays Each Nostril Once Daily 12)  Lipitor 10 Mg Tabs (Atorvastatin Calcium) .Marland Kitchen.. 1 By Mouth Once Daily For Cholesterol 13)  Tylenol Extra Strength 500 Mg Tabs (Acetaminophen) .... As Needed For Headache 14)  Align  Caps (Probiotic Product) .Marland Kitchen.. 1 By Mouth Once Daily For Your Intesinal Flora Restoraion(Have Not Started) 15)  Welchol 3.75 Gm Pack (Colesevelam Hcl) .Marland Kitchen.. 1 By Mouth Qd 16)  Imodium A-D 2 Mg Tabs (Loperamide Hcl) .... Will Take One By Mouth 30 Minutes Before Meals or Two As Needed  Allergies (verified): 1)  ! Demerol (Meperidine Hcl) 2)  Norvasc 3)  Penicillin G Potassium (Penicillin G Potassium) 4)  Sulfamethoxazole (Sulfamethoxazole) 5)  Aspirin (Aspirin) 6)  Benadryl (Diphenhydramine Hcl) 7)  Codeine Phosphate (Codeine Phosphate) 8)  Gentamicin Sulfate (Gentamicin Sulfate) 9)  * Opaque Dye 10)  Questran Light (Cholestyramine Light) 11)  Avelox 12)  Doxycycline 13)  Meclizine Hcl (Meclizine Hcl) 14)  Metronidazole (Metronidazole)  Past History:  Past Medical History: Reviewed history from 03/27/2010 and no changes required. Low back  pain Hypertension Anticoagulation therapy Dr Cyndie Chime DVT, hx of GERD mild Insomnia Herpes R shoulder surg 2010 Dr Rennis Chris Gyn Dr Marcelle Overlie Allergic rhinitis Hyperlipidemia refusing Statins Diarrhea off and on x 6 years Colonic polyps, hx of Irritable Bowel Syndrome Urinary Tract Infection  Past Surgical History: Reviewed history from 04/30/2010 and no changes required. Cholecystectomy Hysterectomy Lumbar  laminectomy x 2 Appendectomy Left Knee Surgery x 2 Right Shoulder Surgery  Breast Cystectomy-benign  Family History: Reviewed history from 04/30/2010 and no changes required. OA No CAD B CHF No FH of Colon Cancer: Family History of Breast Cancer:Sister Family History of Colitis: Sister Family History of Heart Disease: Brother  Social History: Reviewed history from 03/27/2010 and no changes required. Retired Married Childern Patient has never smoked.  Alcohol Use - yes: 2.3 daily Daily Caffeine Use: 1 daily  Illicit Drug Use - no  Review of Systems  The patient denies allergy/sinus, anemia, anxiety-new, arthritis/joint pain, back pain, blood in urine, breast changes/lumps, change in vision, confusion, cough, coughing up blood, depression-new, fainting, fatigue, fever, headaches-new, hearing problems, heart murmur, heart rhythm changes, itching, menstrual pain, muscle pains/cramps, night sweats, nosebleeds, pregnancy symptoms, shortness of breath, skin rash, sleeping problems, sore throat, swelling of feet/legs, swollen lymph glands, thirst - excessive , urination - excessive , urination changes/pain, urine leakage, vision changes, and voice change.         Pertinent positive and negative review of systems were noted in the above HPI. All other ROS was otherwise negative.   Vital Signs:  Patient profile:   77 year old female Height:      64 inches Weight:      145 pounds BMI:     24.98 BSA:     1.71 Pulse rate:   88 / minute Pulse rhythm:   regular BP sitting:   124 / 80  (left arm) Cuff size:   regular  Vitals Entered By: Ok Anis CMA (May 06, 2010 8:48 AM)  Physical Exam  General:  Well developed, well nourished, no acute distress. Eyes:  PERRLA, no icterus. Mouth:  No deformity or lesions, dentition normal. Lungs:  Clear throughout to auscultation. Heart:  Regular rate and rhythm; no murmurs, rubs,  or bruits. Abdomen:  soft abdomen with minimal  discomfort throughout the abdominal wall. Hyperactive bowel sounds. Audible sounds. No palpable mass. No fluid wave. Extremities:  No clubbing, cyanosis, edema or deformities noted. Skin:  Intact without significant lesions or rashes. Psych:  Alert and cooperative. Normal mood and affect.   Impression & Recommendations:  Problem # 1:  DIARRHEA, CHRONIC (ICD-787.91) likely due to an IBS, because of chronicity and negative work up for IBD and infectious causes. One mau consider r/o carcinoid or primary small bowl condition... We will proceed with a 24hr urine collection for HIAA'a and a SBFT Orders: T-Urine 24 Hr. 5 HIAA ([id number]) Small Bowel Follow Through (Sm Bowel FT)  Patient Instructions: 1)  You have been scheduled for a small bowel follow thru @ [facility] on 05/16/10 @ 8 am. You should arrive at 7:45 am for registration. 2)  Your physician requests that you go to the basement floor of our office to have the following labwork completed before leaving today: Urine for HIAA 3)  Colestid 1gm, #2 by mouth dg at 10.00 am 4)  Please pick up your prescriptions at the pharmacy. Electronic prescription(s) has already been sent for Colestid.  5)  Copy sent to : Sonda Primes, MD, [facility] 6)  The medication list was reviewed and reconciled.  All changed / newly prescribed medications were explained.  A complete medication list was provided to the patient / caregiver. Prescriptions: COLESTIPOL HCL 1 GM TABS (COLESTIPOL HCL) Take 2 tablets by mouth once daily at 10 am.  #60 x 3   Entered by:   Lamona Curl CMA (AAMA)   Authorized by:   Hart Carwin MD   Signed by:   Lamona Curl CMA (AAMA) on 05/06/2010   Method used:   Electronically to        [facility]* (retail)       [address]       [place], [place]  [address]       Ph: [phone]       Fax: [phone]   RxID:   [id number]

## 2010-07-16 NOTE — Progress Notes (Signed)
Summary: Diarrhea   Phone Note From Other Clinic   Caller: Debra [phone] Dr Posey Rea Call For: Dr Juanda Chance Summary of Call: Pt at their office now. Having diarrhea and since Dr Juanda Chance has no appoinments until Dec wonders if patient can see the PA? Initial call taken by: Leanor Kail [facility],  March 26, 2010 10:47 AM  Follow-up for Phone Call        Left message for patient to call back Darcey Nora RN, [facility]  March 26, 2010 10:53 AM  Patient  is scheduled to see Willette Cluster RNP tomorrow at 9:30 Darcey Nora RN, [facility]  March 26, 2010 11:13 AM

## 2010-07-16 NOTE — Progress Notes (Signed)
  Phone Note Refill Request Message from:  Fax from Pharmacy  Refills Requested: Medication #1:  LOMOTIL 2.5-0.025 MG TABS 1-2 po bid prn   Supply Requested: 60   Last Refilled: 09/18/2009  Method Requested: Telephone to Pharmacy Initial call taken by: Lanier Prude, [facility]),  January 14, 2010 12:05 PM

## 2010-07-16 NOTE — Procedures (Signed)
Summary: colonoscopy   Colonoscopy  Procedure date:  08/05/2006  Findings:      Location:  [facility].   Patient Name: Tracy Peters, Tracy Peters MRN:  Procedure Procedures: Colonoscopy CPT: [phone].    with biopsy. CPT: [id number].  Personnel: Endoscopist: Oather Muilenburg L. Juanda Chance, MD.  Referred By: Linda Hedges Plotnikov, MD.  Exam Location: Exam performed in Outpatient Clinic. Outpatient  Patient Consent: Procedure, Alternatives, Risks and Benefits discussed, consent obtained, from patient. Consent was obtained by the RN.  Indications Symptoms: Diarrhea Patient is having increased frequency of stools. Patient's stools are liquid. Patient is having seepage and staining of underwear.  Comments: pt has been on chronic Coumadin for a hypercoagulable state, this procedure is done while on Coumadin History  Current Medications: Patient is currently taking Coumadin.  Pre-Exam Physical: Performed Aug 05, 2006. Entire physical exam was normal.  Comments: Pt. history reviewed/updated, physical exam performed prior to initiation of sedation?yes Exam Exam: Extent of exam reached: Cecum, extent intended: Cecum.  The cecum was identified by appendiceal orifice and IC valve. Colon retroflexion performed. Images taken. ASA Classification: II. Tolerance: good.  Monitoring: Pulse and BP monitoring, Oximetry used. Supplemental O2 given.  Colon Prep Used Miralax for colon prep.  Sedation Meds: Patient assessed and found to be appropriate for moderate (conscious) sedation. Fentanyl 75 mcg. given IV. Versed 9 mg. given IV.  Findings - DIAGNOSTIC TEST: Biopsies taken. from Ascending Colon to Descending Colon. Reason: to r/o microscopic colitis.  - POLYP: Cecum, Maximum size: 8 mm. sessile polyp. Distance from Anus 100 cm. Procedure:  biopsy without cautery, not removed, not retrieved, Polyp sent to pathology. ICD9: Colon Polyps: 211.3. Comments: cecal polyp biopsied but not removed, see  photo.  - DIVERTICULOSIS: Sigmoid Colon. ICD9: Diverticulosis: 562.10. Comments: mild sigmoid diverticulosis.  POLYP: Sigmoid Colon, Maximum size: 3 mm. diminutive, sessile polyp. Distance from Anus 20 cm. Procedure:  biopsy without cautery, The polyp was removed piece meal. removed, retrieved, sent to pathology. ICD9: Colon Polyps: 211.3.  - NORMAL EXAM: Rectum.    Comments: scope withdrawal time 10:12 min Assessment Abnormal examination, see findings above.  Diagnoses: 211.3: Colon Polyps.  562.10: Diverticulosis.   Comments: 2 small polyps, one removed and one biopsied, if it is adenomatous she should eventually have it removed while off Coumadin Events  Unplanned Interventions: No intervention was required.  Unplanned Events: There were no complications. Plans Medication Plan: Await pathology. LevsinSL.125 mg: 1 BID, starting Aug 05, 2006  Colestid 2gm: 1 QD, starting Aug 05, 2006   Patient Education: Patient given standard instructions for: Patient instructed to get routine colonoscopy every 5 years.  Disposition: After procedure patient sent to recovery. After recovery patient sent home.    cc: Linda Hedges. Plotnikov, MD  This report was created from the original endoscopy report, which was reviewed and signed by the above listed endoscopist.

## 2010-07-16 NOTE — Progress Notes (Signed)
Summary: Plot pt/Results  Phone Note Call from Patient Call back at Home Phone [phone]   Summary of Call: Patient is requesting results of labs. She wants to know if liver is ok and if she should continue lipitor. Please advise.  Initial call taken by: Lamar Sprinkles, CMA,  December 27, 2009 11:09 AM  Follow-up for Phone Call        LDL is 85 - good. Liver functions are normal. Yes -continue medication. Follow-up by: Jacques Navy MD,  December 27, 2009 12:58 PM  Additional Follow-up for Phone Call Additional follow up Details #1::        Patient notified per MD..Lakisha Archie CMA  December 27, 2009 2:26 PM

## 2010-07-16 NOTE — Consult Note (Signed)
Summary: [facility]  [facility]   Imported By: Lennie Odor 10/17/2009 09:01:21  _____________________________________________________________________  External Attachment:    Type:   Image     Comment:   External Document

## 2010-07-16 NOTE — Progress Notes (Signed)
Summary: RF Zolpidem   Phone Note Call from Patient Message from:  Fax from Pharmacy    Dosage confirmed as above?Dosage Confirmed Caller: [employer] of Call: rec fax from pharm for Zolpidem Tartrate 10mg  for 1 at bedtime.can repeat 1/2 to 1 one time in four hours if necessary.  This sig/quantity does not match our list. Please advise ok to fill???? Initial call taken by: Lanier Prude, [facility]),  February 25, 2010 10:53 AM  Follow-up for Phone Call        Cont with what we have pls x 6 months  Follow-up by: Tresa Garter MD,  February 25, 2010 4:41 PM  Additional Follow-up for Phone Call Additional follow up Details #1::        Rx Called In Additional Follow-up by: Lanier Prude, [facility]),  February 25, 2010 5:00 PM    Prescriptions: ZOLPIDEM TARTRATE 10 MG  TABS (ZOLPIDEM TARTRATE) 1at hs, can repeat 1/2 or 1 by mouth in 4 h if needed  #135 x 5   Entered by:   Lanier Prude, [facility])   Authorized by:   Tresa Garter MD   Signed by:   Lanier Prude, CMA(AAMA) on 02/25/2010   Method used:   Telephoned to ...       [facility]* (retail)       [address]       [place], [place]  [id number]       Ph: [phone]       Fax: [phone]   RxID:   [id number]

## 2010-07-26 ENCOUNTER — Other Ambulatory Visit: Payer: Self-pay | Admitting: Internal Medicine

## 2010-07-26 ENCOUNTER — Other Ambulatory Visit: Payer: 59

## 2010-07-26 ENCOUNTER — Encounter (INDEPENDENT_AMBULATORY_CARE_PROVIDER_SITE_OTHER): Payer: Self-pay | Admitting: *Deleted

## 2010-07-26 DIAGNOSIS — Z0000 Encounter for general adult medical examination without abnormal findings: Secondary | ICD-10-CM

## 2010-07-26 DIAGNOSIS — E785 Hyperlipidemia, unspecified: Secondary | ICD-10-CM

## 2010-07-26 LAB — URINALYSIS
Leukocytes, UA: NEGATIVE
Nitrite: NEGATIVE
Urobilinogen, UA: 0.2 (ref 0.0–1.0)

## 2010-07-26 LAB — CBC WITH DIFFERENTIAL/PLATELET
Basophils Absolute: 0 10*3/uL (ref 0.0–0.1)
Basophils Relative: 0.3 % (ref 0.0–3.0)
Eosinophils Absolute: 0 10*3/uL (ref 0.0–0.7)
HCT: 35.9 % — ABNORMAL LOW (ref 36.0–46.0)
MCHC: 34.4 g/dL (ref 30.0–36.0)
MCV: 100.1 fl — ABNORMAL HIGH (ref 78.0–100.0)
Neutrophils Relative %: 52.1 % (ref 43.0–77.0)
RBC: 3.59 Mil/uL — ABNORMAL LOW (ref 3.87–5.11)

## 2010-07-26 LAB — LIPID PANEL
HDL: 56 mg/dL (ref >39.00)
Total CHOL/HDL Ratio: 4
Triglycerides: 163 mg/dL — ABNORMAL HIGH (ref 0.0–149.0)
VLDL: 32.6 mg/dL (ref 0.0–40.0)

## 2010-07-26 LAB — BASIC METABOLIC PANEL
BUN: 20 mg/dL (ref 6–23)
Potassium: 5.1 mEq/L (ref 3.5–5.1)
Sodium: 136 mEq/L (ref 135–145)

## 2010-07-26 LAB — HEPATIC FUNCTION PANEL
Alkaline Phosphatase: 56 U/L (ref 39–117)
Bilirubin, Direct: 0.1 mg/dL (ref 0.0–0.3)
Total Bilirubin: 0.7 mg/dL (ref 0.3–1.2)
Total Protein: 7 g/dL (ref 6.0–8.3)

## 2010-07-30 ENCOUNTER — Encounter: Payer: Self-pay | Admitting: Internal Medicine

## 2010-07-30 ENCOUNTER — Ambulatory Visit (INDEPENDENT_AMBULATORY_CARE_PROVIDER_SITE_OTHER): Payer: 59 | Admitting: Internal Medicine

## 2010-07-30 DIAGNOSIS — Z0000 Encounter for general adult medical examination without abnormal findings: Secondary | ICD-10-CM

## 2010-08-13 ENCOUNTER — Other Ambulatory Visit: Payer: Self-pay | Admitting: Oncology

## 2010-08-13 ENCOUNTER — Encounter (HOSPITAL_BASED_OUTPATIENT_CLINIC_OR_DEPARTMENT_OTHER): Payer: 59 | Admitting: Oncology

## 2010-08-13 DIAGNOSIS — Z7901 Long term (current) use of anticoagulants: Secondary | ICD-10-CM

## 2010-08-13 DIAGNOSIS — Z86718 Personal history of other venous thrombosis and embolism: Secondary | ICD-10-CM

## 2010-08-13 LAB — PROTIME-INR
INR: 2.3 (ref 2.00–3.50)
Protime: 27.6 Seconds — ABNORMAL HIGH (ref 10.6–13.4)

## 2010-08-13 NOTE — Assessment & Plan Note (Signed)
Summary: 4 MOS WELLNESS/ [facility] MEDICARE/CD   Vital Signs:  Patient profile:   77 year old female Height:      64 inches Weight:      141 pounds BMI:     24.29 Temp:     98.5 degrees F oral Pulse rate:   88 / minute Pulse rhythm:   regular Resp:     16 per minute BP sitting:   130 / 70  (left arm) Cuff size:   regular  Vitals Entered By: Lanier Prude, Adelee Gust) (July 30, 2010 9:47 AM) CC: Wellness exam Is Patient Diabetic? No Comments pt is not taking Lipitor, Welchol or Colestipol   Primary Care Provider:  Tresa Garter MD  CC:  Wellness exam.  History of Present Illness: The patient presents for a preventive health examination   She stopped Lipitor due to diarrhea 3 wks ago  Current Medications (verified): 1)  Zolpidem Tartrate 10 Mg  Tabs (Zolpidem Tartrate) .Marland Kitchen.. 1at Hs, Can Repeat 1/2 or 1 By Mouth in 4 H If Needed 2)  Coumadin 5 Mg Tabs (Warfarin Sodium) .... 7.5 Mg By Mouth On Monday. Rest of Week Will Take 5mg  By Mouth 3)  Lomotil 2.5-0.025 Mg Tabs (Diphenoxylate-Atropine) .Marland Kitchen.. 1-2 Po Two Times Daily As Needed 4)  Enalapril Maleate 20 Mg  Tabs (Enalapril Maleate) .Marland Kitchen.. 1 Once Daily 5)  Valtrex 500 Mg Tabs (Valacyclovir Hcl) .Marland Kitchen.. 1 Once Daily 6)  Tramadol Hcl 50 Mg  Tabs (Tramadol Hcl) .Marland Kitchen.. 1or2 Two Times A Day  Prn 7)  Furosemide 20 Mg Tabs (Furosemide) .Marland Kitchen.. 1 Once Daily As Needed 8)  Potassium Chloride 20 Meq  Pack (Potassium Chloride) .Marland Kitchen.. 1 Once Daily As Needed With Furosemide 9)  Vitamin D3 1000 Unit  Tabs (Cholecalciferol) .Marland Kitchen.. 1 By Mouth Daily 10)  Epipen 0.3 Mg/0.44ml (1:1000)  Devi (Epinephrine Hcl (Anaphylaxis)) .... Use As Needed 11)  Fluticasone Propionate 50 Mcg/act  Susp (Fluticasone Propionate) .... 2 Sprays Each Nostril Once Daily 12)  Lipitor 10 Mg Tabs (Atorvastatin Calcium) .Marland Kitchen.. 1 By Mouth Once Daily For Cholesterol 13)  Tylenol Extra Strength 500 Mg Tabs (Acetaminophen) .... As Needed For Headache 14)  Align  Caps (Probiotic Product)  .Marland Kitchen.. 1 By Mouth Once Daily For Your Intesinal Flora Restoraion(Have Not Started) 15)  Welchol 3.75 Gm Pack (Colesevelam Hcl) .Marland Kitchen.. 1 By Mouth Qd 16)  Imodium A-D 2 Mg Tabs (Loperamide Hcl) .... Will Take One By Mouth 30 Minutes Before Meals or Two As Needed 17)  Colestipol Hcl 1 Gm Tabs (Colestipol Hcl) .... Take 2 Tablets By Mouth Once Daily At 10 Am.  Allergies (verified): 1)  ! Demerol (Meperidine Hcl) 2)  Norvasc 3)  Penicillin G Potassium (Penicillin G Potassium) 4)  Sulfamethoxazole (Sulfamethoxazole) 5)  Aspirin (Aspirin) 6)  Benadryl (Diphenhydramine Hcl) 7)  Codeine Phosphate (Codeine Phosphate) 8)  Gentamicin Sulfate (Gentamicin Sulfate) 9)  * Opaque Dye 10)  Questran Light (Cholestyramine Light) 11)  Avelox 12)  Doxycycline 13)  Meclizine Hcl (Meclizine Hcl) 14)  Metronidazole (Metronidazole) 15)  Lipitor  Past History:  Past Medical History: Last updated: 03/27/2010 Low back pain Hypertension Anticoagulation therapy Dr Cyndie Chime DVT, hx of GERD mild Insomnia Herpes R shoulder surg 2010 Dr Rennis Chris Gyn Dr Marcelle Overlie Allergic rhinitis Hyperlipidemia refusing Statins Diarrhea off and on x 6 years Colonic polyps, hx of Irritable Bowel Syndrome Urinary Tract Infection  Past Surgical History: Last updated: 04/30/2010 Cholecystectomy Hysterectomy Lumbar laminectomy x 2 Appendectomy Left Knee Surgery x 2 Right Shoulder  Surgery  Breast Cystectomy-benign  Family History: Last updated: 04/30/2010 OA No CAD B CHF No FH of Colon Cancer: Family History of Breast Cancer:Sister Family History of Colitis: Sister Family History of Heart Disease: Brother  Social History: Last updated: 03/27/2010 Retired Married Childern Patient has never smoked.  Alcohol Use - yes: 2.3 daily Daily Caffeine Use: 1 daily  Illicit Drug Use - no  Review of Systems  The patient denies anorexia, fever, weight loss, weight gain, vision loss, decreased hearing, hoarseness, chest  pain, syncope, dyspnea on exertion, peripheral edema, prolonged cough, headaches, hemoptysis, abdominal pain, melena, hematochezia, severe indigestion/heartburn, hematuria, incontinence, genital sores, muscle weakness, suspicious skin lesions, transient blindness, difficulty walking, depression, unusual weight change, abnormal bleeding, enlarged lymph nodes, angioedema, and breast masses.         Pains, diarrhea - chronic  Physical Exam  General:  Well-developed,well-nourished,in no acute distress; alert,appropriate and cooperative throughout examination Head:  Normocephalic and atraumatic without obvious abnormalities. No apparent alopecia or balding. Eyes:  No corneal or conjunctival inflammation noted. EOMI. Perrla. Ears:  External ear exam shows no significant lesions or deformities.  Otoscopic examination reveals clear canals, tympanic membranes are intact bilaterally without bulging, retraction, inflammation or discharge. Hearing is grossly normal bilaterally. Nose:  External nasal examination shows no deformity or inflammation. Nasal mucosa are pink and moist without lesions or exudates. Mouth:  Oral mucosa and oropharynx without lesions or exudates.  Teeth in good repair. Neck:  No deformities, masses, or tenderness noted. Lungs:  Normal respiratory effort, chest expands symmetrically. Lungs are clear to auscultation, no crackles or wheezes. Heart:  Normal rate and regular rhythm. S1 and S2 normal without gallop, murmur, click, rub or other extra sounds. Abdomen:  Bowel sounds positive,abdomen soft and non-tender without masses, organomegaly or hernias noted. Msk:  Lumbar-sacral spine is tender to palpation over paraspinal muscles and painfull with the ROM  Extremities:  No clubbing, edema, or deformity noted with normal full range of motion of all joints.  Cold feet Neurologic:  No cranial nerve deficits noted. Station and gait are normal. Plantar reflexes are down-going bilaterally. DTRs  are symmetrical throughout. Sensory, motor and coordinative functions appear intact. Skin:  Intact without suspicious lesions or rashes Psych:  Cognition and judgment appear intact. Alert and cooperative with normal attention span and concentration. No apparent delusions, illusions, hallucinations   Impression & Recommendations:  Problem # 1:  HEALTH MAINTENANCE EXAM (ICD-V70.0) Assessment New Health and age related issues were discussed. Available screening tests and vaccinations were discussed as well. Healthy life style including good diet and exercise was discussed.  The labs were reviewed with the patient.  Gyn/mammo q 12 months  Colon w/Dr Juanda Chance (will be recalled)  Problem # 2:  DIARRHEA, CHRONIC (ICD-787.91) aggravated by Lipitor Assessment: Improved  Her updated medication list for this problem includes:    Lomotil 2.5-0.025 Mg Tabs (Diphenoxylate-atropine) .Marland Kitchen... 1-2 po two times daily as needed    Align Caps (Probiotic product) .Marland Kitchen... 1 by mouth once daily for your intesinal flora restoraion(have not started)    Imodium A-d 2 Mg Tabs (Loperamide hcl) .Marland Kitchen... Will take one by mouth 30 minutes before meals or two as needed  Problem # 3:  HYPERLIPIDEMIA (ICD-272.4) Assessment: Deteriorated  The following medications were removed from the medication list:    Lipitor 10 Mg Tabs (Atorvastatin calcium) .Marland Kitchen... 1 by mouth once daily for cholesterol SHE STOPPED    Welchol 3.75 Gm Pack (Colesevelam hcl) .Marland Kitchen... 1 by mouth qd Her  updated medication list for this problem includes:    Colestipol Hcl 1 Gm Tabs (Colestipol hcl) .Marland Kitchen... Take 2 tablets by mouth once daily at 10 am.  Complete Medication List: 1)  Zolpidem Tartrate 10 Mg Tabs (Zolpidem tartrate) .Marland Kitchen.. 1at hs, can repeat 1/2 or 1 by mouth in 4 h if needed 2)  Coumadin 5 Mg Tabs (Warfarin sodium) .... 7.5 mg by mouth on monday. rest of week will take 5mg  by mouth 3)  Lomotil 2.5-0.025 Mg Tabs (Diphenoxylate-atropine) .Marland Kitchen.. 1-2 po two  times daily as needed 4)  Enalapril Maleate 20 Mg Tabs (Enalapril maleate) .Marland Kitchen.. 1 once daily 5)  Valtrex 500 Mg Tabs (Valacyclovir hcl) .Marland Kitchen.. 1 once daily 6)  Tramadol Hcl 50 Mg Tabs (Tramadol hcl) .Marland Kitchen.. 1or2 two times a day  prn 7)  Furosemide 20 Mg Tabs (Furosemide) .Marland Kitchen.. 1 once daily as needed 8)  Potassium Chloride 20 Meq Pack (Potassium chloride) .Marland Kitchen.. 1 once daily as needed with furosemide 9)  Vitamin D3 1000 Unit Tabs (Cholecalciferol) .Marland Kitchen.. 1 by mouth daily 10)  Epipen 0.3 Mg/0.29ml (1:1000) Devi (Epinephrine hcl (anaphylaxis)) .... Use as needed 11)  Fluticasone Propionate 50 Mcg/act Susp (Fluticasone propionate) .... 2 sprays each nostril once daily 12)  Tylenol Extra Strength 500 Mg Tabs (Acetaminophen) .... As needed for headache 13)  Align Caps (Probiotic product) .Marland Kitchen.. 1 by mouth once daily for your intesinal flora restoraion(have not started) 14)  Imodium A-d 2 Mg Tabs (Loperamide hcl) .... Will take one by mouth 30 minutes before meals or two as needed 15)  Colestipol Hcl 1 Gm Tabs (Colestipol hcl) .... Take 2 tablets by mouth once daily at 10 am.  Patient Instructions: 1)  Please schedule a follow-up appointment in 4 months. 2)  BMP prior to visit, ICD-9:272.00  995.20 3)  Lipid Panel prior to visit, ICD-9: Prescriptions: LOMOTIL 2.5-0.025 MG TABS (DIPHENOXYLATE-ATROPINE) 1-2 po two times daily as needed  #60 x 2   Entered and Authorized by:   Tresa Garter MD   Signed by:   Tresa Garter MD on 07/30/2010   Method used:   Print then Give to Patient   RxID:   [id number] TRAMADOL HCL 50 MG  TABS (TRAMADOL HCL) 1or2 two times a day  prn  #90 x 1   Entered and Authorized by:   Tresa Garter MD   Signed by:   Tresa Garter MD on 07/30/2010   Method used:   Print then Give to Patient   RxID:   [id number] ENALAPRIL MALEATE 20 MG  TABS (ENALAPRIL MALEATE) 1 once daily  #90 x 3   Entered and Authorized by:   Tresa Garter MD   Signed by:    Tresa Garter MD on 07/30/2010   Method used:   Print then Give to Patient   RxID:   [id number] ZOLPIDEM TARTRATE 10 MG  TABS (ZOLPIDEM TARTRATE) 1at hs, can repeat 1/2 or 1 by mouth in 4 h if needed  #135 x 1   Entered and Authorized by:   Tresa Garter MD   Signed by:   Tresa Garter MD on 07/30/2010   Method used:   Print then Give to Patient   RxID:   [id number]    Orders Added: 1)  Est. Patient 65& > [33295]   Immunization History:  Influenza Immunization History:    Influenza:  historical (04/24/2010)  Tetanus/Td Immunization History:    Tetanus/Td:  historical (04/25/2004)   Immunization History:  Influenza Immunization History:    Influenza:  Historical (04/24/2010)  Tetanus/Td Immunization History:    Tetanus/Td:  Historical (04/25/2004)

## 2010-09-10 ENCOUNTER — Other Ambulatory Visit: Payer: Self-pay | Admitting: Oncology

## 2010-09-10 ENCOUNTER — Encounter (HOSPITAL_BASED_OUTPATIENT_CLINIC_OR_DEPARTMENT_OTHER): Payer: 59 | Admitting: Oncology

## 2010-09-10 DIAGNOSIS — Z7901 Long term (current) use of anticoagulants: Secondary | ICD-10-CM

## 2010-09-10 DIAGNOSIS — Z86718 Personal history of other venous thrombosis and embolism: Secondary | ICD-10-CM

## 2010-09-10 LAB — PROTIME-INR
INR: 2.4 (ref 2.00–3.50)
Protime: 28.8 Seconds — ABNORMAL HIGH (ref 10.6–13.4)

## 2010-10-08 ENCOUNTER — Other Ambulatory Visit: Payer: Self-pay | Admitting: Oncology

## 2010-10-08 ENCOUNTER — Encounter (HOSPITAL_BASED_OUTPATIENT_CLINIC_OR_DEPARTMENT_OTHER): Payer: 59 | Admitting: Oncology

## 2010-10-08 DIAGNOSIS — Z86718 Personal history of other venous thrombosis and embolism: Secondary | ICD-10-CM

## 2010-10-08 DIAGNOSIS — Z7901 Long term (current) use of anticoagulants: Secondary | ICD-10-CM

## 2010-10-15 ENCOUNTER — Other Ambulatory Visit: Payer: Self-pay | Admitting: Oncology

## 2010-10-15 ENCOUNTER — Encounter (HOSPITAL_BASED_OUTPATIENT_CLINIC_OR_DEPARTMENT_OTHER): Payer: 59 | Admitting: Oncology

## 2010-10-15 DIAGNOSIS — R252 Cramp and spasm: Secondary | ICD-10-CM

## 2010-10-15 DIAGNOSIS — Z86718 Personal history of other venous thrombosis and embolism: Secondary | ICD-10-CM

## 2010-10-15 DIAGNOSIS — M7989 Other specified soft tissue disorders: Secondary | ICD-10-CM

## 2010-10-15 DIAGNOSIS — Z7901 Long term (current) use of anticoagulants: Secondary | ICD-10-CM

## 2010-10-15 LAB — PROTIME-INR: INR: 1.6 — ABNORMAL LOW (ref 2.00–3.50)

## 2010-10-22 ENCOUNTER — Encounter (HOSPITAL_BASED_OUTPATIENT_CLINIC_OR_DEPARTMENT_OTHER): Payer: 59 | Admitting: Oncology

## 2010-10-22 ENCOUNTER — Other Ambulatory Visit: Payer: Self-pay | Admitting: Oncology

## 2010-10-22 DIAGNOSIS — Z86718 Personal history of other venous thrombosis and embolism: Secondary | ICD-10-CM

## 2010-10-22 DIAGNOSIS — Z7901 Long term (current) use of anticoagulants: Secondary | ICD-10-CM

## 2010-10-22 LAB — PROTIME-INR: Protime: 19.2 Seconds — ABNORMAL HIGH (ref 10.6–13.4)

## 2010-10-29 NOTE — Assessment & Plan Note (Signed)
[facility]                                 ON-CALL NOTE   NAME:NEWBYRosealee, Recinos                      MRN:          [id number]  DATE:11/06/2007                            DOB:          1934/01/13    Time of interaction:  8:13 a.m.  Phone #:  [phone]   PRIMARY CARE PHYSICIAN:  Dr. Georgina Quint. Plotnikov, home office is [place].   OBJECTIVE:  The patient has nausea and headache.  Neck is sore  posteriorly.  She has no fever; had a bite 2 days ago.  She is very  allergic to stings, not sure what to do.  Told her because she probably  needs lab work, we will send her to the emergency room to be seen.     Arta Silence, MD  Electronically Signed    RNS/MedQ  DD: 11/06/2007  DT: 11/06/2007  Job #: [id number]

## 2010-10-31 ENCOUNTER — Encounter (HOSPITAL_BASED_OUTPATIENT_CLINIC_OR_DEPARTMENT_OTHER): Payer: 59 | Admitting: Oncology

## 2010-10-31 ENCOUNTER — Other Ambulatory Visit: Payer: Self-pay | Admitting: Oncology

## 2010-10-31 DIAGNOSIS — Z7901 Long term (current) use of anticoagulants: Secondary | ICD-10-CM

## 2010-10-31 DIAGNOSIS — Z86718 Personal history of other venous thrombosis and embolism: Secondary | ICD-10-CM

## 2010-10-31 LAB — PROTIME-INR: Protime: 26.4 Seconds — ABNORMAL HIGH (ref 10.6–13.4)

## 2010-11-01 NOTE — Assessment & Plan Note (Signed)
[facility]                         GASTROENTEROLOGY OFFICE NOTE   NAME:CULBRETH-NEWBYDalaney, Needle               MRN:          [id number]  DATE:08/04/2006                            DOB:          June 06, 1934    Tracy Peters is a 77 year old white female patient of Dr. Posey Rea  referred for evaluation of diarrhea.  We saw [name] apparently  about 10 years ago when she was hospitalized for hypercoagulable state.  She was on chronic Coumadin therapy under the care of Dr. Cyndie Chime.  The plans were for her to continue the Coumadin indefinitely.  For  years, she has had 4 to 5 bowel movements a day.  The symptoms started  after her cholecystectomy in 1988.  In the last 6 months, her stools  have become looser and more frequent, up to 12 bowel movements a day.  Most of the diarrhea occurs in the morning between a.m. and 2 p.m.  She  very rarely has nocturnal diarrhea.  There has been no bleeding and no  abdominal pain.  She has urgency and 2 episodes of incontinence.  Weight  has remained stable or slightly increased.  Her appetite is good.   MEDICATIONS:  1. K-Dur 20 mEq daily.  2. Valtrex currently 1000 mg daily.  3. Lasix 40 mg 3 times a week.  4. Tramadol 50 mg q. afternoon.  5. Coumadin 45 mg daily.  6. Estradiol 1 mg daily.  7. Ambien 10 mg daily.  8. Enalapril 20 mg p.o. daily.   PAST HISTORY:  High blood pressure for 10 years.  Blood clotting  disease.  She has had 2 back surgeries in 1996 and 1998 L5 and S1 disks,  2 knee surgeries 1982 and 2006.  She had a cholecystectomy in 1988,  hysterectomy in 1969, and breast surgery for benign disease.   FAMILY HISTORY:  Positive for ulcerative colitis in her sister.  Heart  disease and stroke in her brother, breast cancer in sister.   SOCIAL HISTORY:  Married with 2 children.  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  She does not smoke.  Drinks alcohol socially.   REVIEW OF  SYSTEMS:  Occasional swelling of her feet, sleeping problems,  leakage of urine, and back pain.   PHYSICAL EXAM:  Blood pressure 120/72, pulse 68, and weight 150 pounds.  She was alert and oriented, in no distress.  Sclerae not icteric.  NECK:  Supple.  No adenopathy.  LUNGS:  Clear to auscultation.  COR:  Normal S1, normal S2.  ABDOMEN:  Soft with post cholecystectomy scarring in the right upper  quadrant.  Bowel sounds are normoactive.  There were no abnormal rushes.  There was no tenderness and no distension.  I could not appreciate any  mass or fullness.  RECTAL:  Tone was normal though somewhat decreased.  There was a small  amount of Hemoccult negative stool in the rectal ampulla.  EXTREMITIES:  No edema.   IMPRESSION:  A 77 year old white female with progressive diarrhea, which  occurs mostly during the day without any constitutional symptoms or  failure of thrive.  Differential diagnosis include irritable bowel  syndrome, choleretic diarrhea, inflammatory bowel disease, or even  collagenous colitis or microscopic colitis.  It would be unlikely to be  related to any of her medications.  There is no evidence of  gastrointestinal blood loss, but she does have a family history of  ulcerative colitis in her sister.  All of her stool studies by Dr.  Posey Rea have been negative.   PLAN:  1. Colonoscopy scheduled with random biopsies.  2. Trial of antispasmodic Levsin sublingually twice a day.  3. Since she could not tolerate Questran 4 mg a day, I would consider      adding Colestid 2gm  daily for presumed choleretic diarrhea.  4. Tissue transglutaminase level, sedimentation rate, and TSH today.      Depending on the results of the colonoscopy, she may need further      evaluation, such as small bowel follow through or trial of other      medications.     Hedwig Morton. Juanda Chance, MD  Electronically Signed    DMB/MedQ  DD: 08/04/2006  DT: 08/04/2006  Job #: [id number]   cc:   Georgina Quint. Plotnikov, MD

## 2010-11-19 ENCOUNTER — Encounter (HOSPITAL_BASED_OUTPATIENT_CLINIC_OR_DEPARTMENT_OTHER): Payer: 59 | Admitting: Oncology

## 2010-11-19 ENCOUNTER — Other Ambulatory Visit: Payer: Self-pay | Admitting: Internal Medicine

## 2010-11-19 ENCOUNTER — Other Ambulatory Visit: Payer: Self-pay | Admitting: Oncology

## 2010-11-19 ENCOUNTER — Other Ambulatory Visit (INDEPENDENT_AMBULATORY_CARE_PROVIDER_SITE_OTHER): Payer: 59

## 2010-11-19 DIAGNOSIS — T887XXA Unspecified adverse effect of drug or medicament, initial encounter: Secondary | ICD-10-CM

## 2010-11-19 DIAGNOSIS — Z7901 Long term (current) use of anticoagulants: Secondary | ICD-10-CM

## 2010-11-19 DIAGNOSIS — Z86718 Personal history of other venous thrombosis and embolism: Secondary | ICD-10-CM

## 2010-11-19 LAB — BASIC METABOLIC PANEL
BUN: 16 mg/dL (ref 6–23)
CO2: 28 mEq/L (ref 19–32)
Calcium: 9.4 mg/dL (ref 8.4–10.5)
GFR: 62.43 mL/min (ref >60.00)
Glucose, Bld: 91 mg/dL (ref 70–99)
Sodium: 134 mEq/L — ABNORMAL LOW (ref 135–145)

## 2010-11-19 LAB — LIPID PANEL
HDL: 66.3 mg/dL (ref >39.00)
Total CHOL/HDL Ratio: 3
VLDL: 18.2 mg/dL (ref 0.0–40.0)

## 2010-11-19 LAB — PROTIME-INR: Protime: 24 Seconds — ABNORMAL HIGH (ref 10.6–13.4)

## 2010-11-26 ENCOUNTER — Telehealth: Payer: Self-pay | Admitting: *Deleted

## 2010-11-26 ENCOUNTER — Encounter: Payer: Self-pay | Admitting: Internal Medicine

## 2010-11-26 ENCOUNTER — Ambulatory Visit (INDEPENDENT_AMBULATORY_CARE_PROVIDER_SITE_OTHER): Payer: 59 | Admitting: Internal Medicine

## 2010-11-26 DIAGNOSIS — I10 Essential (primary) hypertension: Secondary | ICD-10-CM

## 2010-11-26 DIAGNOSIS — E785 Hyperlipidemia, unspecified: Secondary | ICD-10-CM

## 2010-11-26 DIAGNOSIS — M545 Low back pain, unspecified: Secondary | ICD-10-CM

## 2010-11-26 DIAGNOSIS — G4700 Insomnia, unspecified: Secondary | ICD-10-CM

## 2010-11-26 DIAGNOSIS — Z86718 Personal history of other venous thrombosis and embolism: Secondary | ICD-10-CM

## 2010-11-26 DIAGNOSIS — R197 Diarrhea, unspecified: Secondary | ICD-10-CM

## 2010-11-26 MED ORDER — VALACYCLOVIR HCL 500 MG PO TABS: 500.0000 mg | ORAL_TABLET | Freq: Every day | ORAL | Status: DC

## 2010-11-26 MED ORDER — WARFARIN SODIUM 5 MG PO TABS: 5.0000 mg | ORAL_TABLET | ORAL | Status: DC

## 2010-11-26 MED ORDER — EPINEPHRINE 0.3 MG/0.3ML IJ DEVI: 0.3000 mg | INTRAMUSCULAR | Status: DC | PRN

## 2010-11-26 MED ORDER — FLUTICASONE PROPIONATE 50 MCG/ACT NA SUSP: 2.0000 | Freq: Every day | NASAL | Status: DC

## 2010-11-26 MED ORDER — ENALAPRIL MALEATE 20 MG PO TABS: 20.0000 mg | ORAL_TABLET | Freq: Every day | ORAL | Status: DC

## 2010-11-26 MED ORDER — DIPHENOXYLATE-ATROPINE 2.5-0.025 MG PO TABS: 1.0000 | ORAL_TABLET | Freq: Two times a day (BID) | ORAL | Status: DC | PRN

## 2010-11-26 MED ORDER — ZOLPIDEM TARTRATE 10 MG PO TABS: 10.0000 mg | ORAL_TABLET | Freq: Every evening | ORAL | Status: DC | PRN

## 2010-11-26 MED ORDER — TRAMADOL HCL 50 MG PO TABS: 50.0000 mg | ORAL_TABLET | Freq: Two times a day (BID) | ORAL | Status: DC | PRN

## 2010-11-26 NOTE — Assessment & Plan Note (Signed)
On Rx 

## 2010-11-26 NOTE — Assessment & Plan Note (Signed)
On Coumadin 

## 2010-11-26 NOTE — Assessment & Plan Note (Signed)
Lab Results  Component Value Date   WBC 6.3 07/26/2010   HGB 12.3 07/26/2010   HCT 35.9* 07/26/2010   PLT 280.0 07/26/2010   CHOL 199 11/19/2010   TRIG 91.0 11/19/2010   HDL 66.30 11/19/2010   LDLDIRECT 127.1 07/26/2010   ALT 19 07/26/2010   AST 24 07/26/2010   NA 134* 11/19/2010   K 5.1 11/19/2010   CL 100 11/19/2010   CREATININE 0.9 11/19/2010   BUN 16 11/19/2010   CO2 28 11/19/2010   TSH 2.22 07/26/2010   INR 2.00 11/19/2010

## 2010-11-26 NOTE — Telephone Encounter (Signed)
Pt gets zolpidem # 135, MD gave #90 today. Voided written RX today and called in new w/#135

## 2010-11-26 NOTE — Assessment & Plan Note (Signed)
Better now off nuts, choclate

## 2010-11-26 NOTE — Progress Notes (Signed)
  Subjective:    Patient ID: Tracy Peters, female    DOB: [birth year], 77 y.o.   MRN: [id number]  HPI  The patient presents for a follow-up of  chronic hypertension, chronic dyslipidemia, anticoagulation controlled with medicines    Review of Systems  Constitutional: Negative for chills, activity change, appetite change, fatigue and unexpected weight change.  HENT: Positive for hearing loss. Negative for congestion, mouth sores and sinus pressure.   Eyes: Negative for visual disturbance.  Respiratory: Negative for cough and chest tightness.   Gastrointestinal: Negative for nausea and abdominal pain.  Genitourinary: Negative for frequency, difficulty urinating and vaginal pain.  Musculoskeletal: Negative for back pain and gait problem.  Skin: Negative for pallor and rash.  Neurological: Negative for dizziness, tremors, weakness, numbness and headaches.  Psychiatric/Behavioral: Negative for confusion and sleep disturbance.       Objective:   Physical Exam  Constitutional: She appears well-developed and well-nourished. No distress.  HENT:  Head: Normocephalic.  Right Ear: External ear normal.  Left Ear: External ear normal.  Nose: Nose normal.  Mouth/Throat: Oropharynx is clear and moist.       L TM with perf 2 mm  Eyes: Conjunctivae are normal. Pupils are equal, round, and reactive to light. Right eye exhibits no discharge. Left eye exhibits no discharge.  Neck: Normal range of motion. Neck supple. No JVD present. No tracheal deviation present. No thyromegaly present.  Cardiovascular: Normal rate, regular rhythm and normal heart sounds.   Pulmonary/Chest: No stridor. No respiratory distress. She has no wheezes.  Abdominal: Soft. Bowel sounds are normal. She exhibits no distension and no mass. There is no tenderness. There is no rebound and no guarding.  Musculoskeletal: She exhibits no edema and no tenderness.  Lymphadenopathy:    She has no cervical adenopathy.  Neurological:  She displays normal reflexes. No cranial nerve deficit. She exhibits normal muscle tone. Coordination normal.  Skin: No rash noted. No erythema.  Psychiatric: She has a normal mood and affect. Her behavior is normal. Judgment and thought content normal.          Assessment & Plan:

## 2010-12-17 ENCOUNTER — Other Ambulatory Visit: Payer: Self-pay | Admitting: Oncology

## 2010-12-17 ENCOUNTER — Encounter (HOSPITAL_BASED_OUTPATIENT_CLINIC_OR_DEPARTMENT_OTHER): Payer: 59 | Admitting: Oncology

## 2010-12-17 DIAGNOSIS — Z86718 Personal history of other venous thrombosis and embolism: Secondary | ICD-10-CM

## 2010-12-17 DIAGNOSIS — Z7901 Long term (current) use of anticoagulants: Secondary | ICD-10-CM

## 2010-12-17 DIAGNOSIS — M7989 Other specified soft tissue disorders: Secondary | ICD-10-CM

## 2010-12-17 DIAGNOSIS — R252 Cramp and spasm: Secondary | ICD-10-CM

## 2011-01-20 ENCOUNTER — Other Ambulatory Visit: Payer: Self-pay | Admitting: Internal Medicine

## 2011-01-21 ENCOUNTER — Other Ambulatory Visit: Payer: Self-pay | Admitting: Oncology

## 2011-01-21 ENCOUNTER — Encounter (HOSPITAL_BASED_OUTPATIENT_CLINIC_OR_DEPARTMENT_OTHER): Payer: 59 | Admitting: Oncology

## 2011-01-21 DIAGNOSIS — Z86718 Personal history of other venous thrombosis and embolism: Secondary | ICD-10-CM

## 2011-01-21 DIAGNOSIS — M7989 Other specified soft tissue disorders: Secondary | ICD-10-CM

## 2011-01-21 DIAGNOSIS — Z7901 Long term (current) use of anticoagulants: Secondary | ICD-10-CM

## 2011-01-21 DIAGNOSIS — R252 Cramp and spasm: Secondary | ICD-10-CM

## 2011-02-04 ENCOUNTER — Other Ambulatory Visit: Payer: Self-pay | Admitting: Oncology

## 2011-02-04 ENCOUNTER — Encounter (HOSPITAL_BASED_OUTPATIENT_CLINIC_OR_DEPARTMENT_OTHER): Payer: 59 | Admitting: Oncology

## 2011-02-04 DIAGNOSIS — R252 Cramp and spasm: Secondary | ICD-10-CM

## 2011-02-04 DIAGNOSIS — Z7901 Long term (current) use of anticoagulants: Secondary | ICD-10-CM

## 2011-02-04 DIAGNOSIS — Z86718 Personal history of other venous thrombosis and embolism: Secondary | ICD-10-CM

## 2011-02-04 DIAGNOSIS — M7989 Other specified soft tissue disorders: Secondary | ICD-10-CM

## 2011-02-04 LAB — PROTIME-INR

## 2011-02-11 ENCOUNTER — Other Ambulatory Visit: Payer: Self-pay | Admitting: Oncology

## 2011-02-11 ENCOUNTER — Encounter (HOSPITAL_BASED_OUTPATIENT_CLINIC_OR_DEPARTMENT_OTHER): Payer: 59 | Admitting: Oncology

## 2011-02-11 DIAGNOSIS — Z7901 Long term (current) use of anticoagulants: Secondary | ICD-10-CM

## 2011-02-11 DIAGNOSIS — R252 Cramp and spasm: Secondary | ICD-10-CM

## 2011-02-11 DIAGNOSIS — Z86718 Personal history of other venous thrombosis and embolism: Secondary | ICD-10-CM

## 2011-02-11 DIAGNOSIS — M7989 Other specified soft tissue disorders: Secondary | ICD-10-CM

## 2011-02-11 LAB — PROTIME-INR: INR: 2.8 (ref 2.00–3.50)

## 2011-02-25 ENCOUNTER — Ambulatory Visit (INDEPENDENT_AMBULATORY_CARE_PROVIDER_SITE_OTHER): Payer: 59 | Admitting: *Deleted

## 2011-02-25 DIAGNOSIS — Z23 Encounter for immunization: Secondary | ICD-10-CM

## 2011-03-11 ENCOUNTER — Encounter (HOSPITAL_BASED_OUTPATIENT_CLINIC_OR_DEPARTMENT_OTHER): Payer: 59 | Admitting: Oncology

## 2011-03-11 ENCOUNTER — Other Ambulatory Visit: Payer: Self-pay | Admitting: Oncology

## 2011-03-11 DIAGNOSIS — Z7901 Long term (current) use of anticoagulants: Secondary | ICD-10-CM

## 2011-03-11 DIAGNOSIS — Z86718 Personal history of other venous thrombosis and embolism: Secondary | ICD-10-CM

## 2011-03-11 LAB — PROTIME-INR

## 2011-03-12 LAB — POCT I-STAT, CHEM 8
HCT: 42
Hemoglobin: 14.3
Potassium: 4.1
Sodium: 137

## 2011-03-12 LAB — PROTIME-INR
INR: 1.8 — ABNORMAL HIGH
Prothrombin Time: 21.2 — ABNORMAL HIGH

## 2011-03-17 ENCOUNTER — Other Ambulatory Visit: Payer: Self-pay | Admitting: Oncology

## 2011-03-17 ENCOUNTER — Encounter (HOSPITAL_BASED_OUTPATIENT_CLINIC_OR_DEPARTMENT_OTHER): Payer: 59 | Admitting: Oncology

## 2011-03-17 DIAGNOSIS — D539 Nutritional anemia, unspecified: Secondary | ICD-10-CM

## 2011-03-17 DIAGNOSIS — Z86718 Personal history of other venous thrombosis and embolism: Secondary | ICD-10-CM

## 2011-03-17 DIAGNOSIS — Z7901 Long term (current) use of anticoagulants: Secondary | ICD-10-CM

## 2011-03-17 LAB — CBC WITH DIFFERENTIAL/PLATELET
BASO%: 0.3 % (ref 0.0–2.0)
EOS%: 0.8 % (ref 0.0–7.0)
HCT: 37.9 % (ref 34.8–46.6)
MCH: 34.7 pg — ABNORMAL HIGH (ref 25.1–34.0)
MCHC: 34.3 g/dL (ref 31.5–36.0)
NEUT%: 58.4 % (ref 38.4–76.8)
RDW: 13.1 % (ref 11.2–14.5)
lymph#: 2.3 10*3/uL (ref 0.9–3.3)

## 2011-03-17 LAB — PROTIME-INR
INR: 2.6 (ref 2.00–3.50)
Protime: 31.2 Seconds — ABNORMAL HIGH (ref 10.6–13.4)

## 2011-03-17 LAB — T4: T4, Total: 5.1 ug/dL (ref 5.0–12.5)

## 2011-03-17 LAB — COMPREHENSIVE METABOLIC PANEL
Albumin: 4.2 g/dL (ref 3.5–5.2)
Alkaline Phosphatase: 47 U/L (ref 39–117)
BUN: 19 mg/dL (ref 6–23)
Glucose, Bld: 84 mg/dL (ref 70–99)
Potassium: 4.5 mEq/L (ref 3.5–5.3)

## 2011-03-17 LAB — TSH: TSH: 2.892 u[IU]/mL (ref 0.350–4.500)

## 2011-03-19 ENCOUNTER — Other Ambulatory Visit: Payer: Self-pay | Admitting: Oncology

## 2011-03-28 ENCOUNTER — Other Ambulatory Visit: Payer: Self-pay | Admitting: Oncology

## 2011-03-28 DIAGNOSIS — D3A00 Benign carcinoid tumor of unspecified site: Secondary | ICD-10-CM

## 2011-03-31 ENCOUNTER — Other Ambulatory Visit: Payer: Self-pay | Admitting: Internal Medicine

## 2011-03-31 NOTE — Telephone Encounter (Signed)
Please advise refills on both

## 2011-04-03 ENCOUNTER — Ambulatory Visit (HOSPITAL_COMMUNITY)
Admission: RE | Admit: 2011-04-03 | Discharge: 2011-04-03 | Disposition: A | Discharge status: Home / Self Care | Payer: 59 | Source: Ambulatory Visit | Attending: Oncology | Admitting: Oncology

## 2011-04-03 DIAGNOSIS — D3A00 Benign carcinoid tumor of unspecified site: Secondary | ICD-10-CM

## 2011-04-07 ENCOUNTER — Other Ambulatory Visit (HOSPITAL_COMMUNITY): Payer: 59

## 2011-04-07 ENCOUNTER — Ambulatory Visit (HOSPITAL_COMMUNITY)
Admission: RE | Admit: 2011-04-07 | Discharge: 2011-04-07 | Disposition: A | Discharge status: Home / Self Care | Payer: 59 | Source: Ambulatory Visit | Attending: Oncology | Admitting: Oncology

## 2011-04-07 DIAGNOSIS — R197 Diarrhea, unspecified: Secondary | ICD-10-CM | POA: Insufficient documentation

## 2011-04-07 DIAGNOSIS — K5730 Diverticulosis of large intestine without perforation or abscess without bleeding: Secondary | ICD-10-CM | POA: Insufficient documentation

## 2011-04-07 DIAGNOSIS — D3A00 Benign carcinoid tumor of unspecified site: Secondary | ICD-10-CM

## 2011-04-07 DIAGNOSIS — Z90710 Acquired absence of both cervix and uterus: Secondary | ICD-10-CM | POA: Insufficient documentation

## 2011-04-07 DIAGNOSIS — Z9089 Acquired absence of other organs: Secondary | ICD-10-CM | POA: Insufficient documentation

## 2011-04-07 DIAGNOSIS — N2881 Hypertrophy of kidney: Secondary | ICD-10-CM | POA: Insufficient documentation

## 2011-04-07 DIAGNOSIS — N269 Renal sclerosis, unspecified: Secondary | ICD-10-CM | POA: Insufficient documentation

## 2011-04-07 MED ORDER — IOHEXOL 300 MG/ML  SOLN
100.0000 mL | Freq: Once | INTRAMUSCULAR | Status: AC | PRN
  Administered 2011-04-07: 100 mL via INTRAVENOUS

## 2011-04-09 ENCOUNTER — Encounter (HOSPITAL_BASED_OUTPATIENT_CLINIC_OR_DEPARTMENT_OTHER): Payer: 59 | Admitting: Oncology

## 2011-04-09 ENCOUNTER — Other Ambulatory Visit: Payer: Self-pay | Admitting: Oncology

## 2011-04-09 DIAGNOSIS — R634 Abnormal weight loss: Secondary | ICD-10-CM

## 2011-04-09 DIAGNOSIS — Z7901 Long term (current) use of anticoagulants: Secondary | ICD-10-CM

## 2011-04-09 DIAGNOSIS — R232 Flushing: Secondary | ICD-10-CM

## 2011-04-09 DIAGNOSIS — Z86718 Personal history of other venous thrombosis and embolism: Secondary | ICD-10-CM

## 2011-04-09 LAB — PROTIME-INR
INR: 2 (ref 2.00–3.50)
Protime: 24 Seconds — ABNORMAL HIGH (ref 10.6–13.4)

## 2011-05-13 ENCOUNTER — Ambulatory Visit (HOSPITAL_BASED_OUTPATIENT_CLINIC_OR_DEPARTMENT_OTHER): Payer: Self-pay | Admitting: Pharmacist

## 2011-05-13 ENCOUNTER — Other Ambulatory Visit (HOSPITAL_BASED_OUTPATIENT_CLINIC_OR_DEPARTMENT_OTHER): Payer: 59 | Admitting: Lab

## 2011-05-13 ENCOUNTER — Ambulatory Visit: Payer: 59

## 2011-05-13 ENCOUNTER — Other Ambulatory Visit: Payer: Self-pay | Admitting: Oncology

## 2011-05-13 DIAGNOSIS — I82409 Acute embolism and thrombosis of unspecified deep veins of unspecified lower extremity: Secondary | ICD-10-CM

## 2011-05-13 DIAGNOSIS — R232 Flushing: Secondary | ICD-10-CM

## 2011-05-13 DIAGNOSIS — Z86718 Personal history of other venous thrombosis and embolism: Secondary | ICD-10-CM

## 2011-05-13 DIAGNOSIS — R634 Abnormal weight loss: Secondary | ICD-10-CM

## 2011-05-13 DIAGNOSIS — Z7901 Long term (current) use of anticoagulants: Secondary | ICD-10-CM

## 2011-05-13 LAB — PROTIME-INR: Protime: 27.6 Seconds — ABNORMAL HIGH (ref 10.6–13.4)

## 2011-05-13 NOTE — Progress Notes (Signed)
Saw Dr. Marcelle Overlie yesterday.  He does not want to run hormone levels.  Risk of HRT >> benefit. Pt could consider allergy testing if further "episodes". Doing better & hasn't had any "episodes" lately.  Avoiding EtOH, nuts, chocolate.

## 2011-05-23 ENCOUNTER — Other Ambulatory Visit: Payer: Self-pay | Admitting: Pharmacist

## 2011-05-23 DIAGNOSIS — Z86718 Personal history of other venous thrombosis and embolism: Secondary | ICD-10-CM

## 2011-05-28 ENCOUNTER — Ambulatory Visit (INDEPENDENT_AMBULATORY_CARE_PROVIDER_SITE_OTHER): Payer: 59 | Admitting: Internal Medicine

## 2011-05-28 ENCOUNTER — Encounter: Payer: Self-pay | Admitting: Internal Medicine

## 2011-05-28 ENCOUNTER — Telehealth: Payer: Self-pay | Admitting: *Deleted

## 2011-05-28 DIAGNOSIS — I10 Essential (primary) hypertension: Secondary | ICD-10-CM

## 2011-05-28 DIAGNOSIS — L509 Urticaria, unspecified: Secondary | ICD-10-CM | POA: Insufficient documentation

## 2011-05-28 DIAGNOSIS — E785 Hyperlipidemia, unspecified: Secondary | ICD-10-CM

## 2011-05-28 DIAGNOSIS — Z86718 Personal history of other venous thrombosis and embolism: Secondary | ICD-10-CM

## 2011-05-28 DIAGNOSIS — R197 Diarrhea, unspecified: Secondary | ICD-10-CM

## 2011-05-28 MED ORDER — VALACYCLOVIR HCL 500 MG PO TABS: 500.0000 mg | ORAL_TABLET | Freq: Every day | ORAL | Status: DC

## 2011-05-28 MED ORDER — EPINEPHRINE 0.3 MG/0.3ML IJ DEVI: 0.3000 mg | Freq: Once | INTRAMUSCULAR | Status: DC

## 2011-05-28 MED ORDER — DIPHENOXYLATE-ATROPINE 2.5-0.025 MG PO TABS: 1.0000 | ORAL_TABLET | Freq: Two times a day (BID) | ORAL | Status: DC | PRN

## 2011-05-28 MED ORDER — PSEUDOEPHEDRINE HCL 30 MG PO TABS: 60.0000 mg | ORAL_TABLET | Freq: Four times a day (QID) | ORAL | Status: AC | PRN

## 2011-05-28 MED ORDER — LOSARTAN POTASSIUM 100 MG PO TABS: 100.0000 mg | ORAL_TABLET | Freq: Every day | ORAL | Status: DC

## 2011-05-28 MED ORDER — TRIAMCINOLONE ACETONIDE 0.5 % EX CREA: TOPICAL_CREAM | Freq: Four times a day (QID) | CUTANEOUS | Status: AC | PRN

## 2011-05-28 MED ORDER — FEXOFENADINE HCL 180 MG PO TABS: 180.0000 mg | ORAL_TABLET | Freq: Every day | ORAL | Status: DC | PRN

## 2011-05-28 MED ORDER — ENALAPRIL MALEATE 20 MG PO TABS: 10.0000 mg | ORAL_TABLET | Freq: Every day | ORAL | Status: DC

## 2011-05-28 MED ORDER — WARFARIN SODIUM 5 MG PO TABS: 5.0000 mg | ORAL_TABLET | ORAL | Status: DC

## 2011-05-28 MED ORDER — TRAMADOL HCL 50 MG PO TABS: 50.0000 mg | ORAL_TABLET | Freq: Two times a day (BID) | ORAL | Status: DC | PRN

## 2011-05-28 MED ORDER — PREDNISONE 10 MG PO TABS: ORAL_TABLET | ORAL | Status: DC

## 2011-05-28 MED ORDER — ZOLPIDEM TARTRATE 10 MG PO TABS: 10.0000 mg | ORAL_TABLET | Freq: Every evening | ORAL | Status: DC | PRN

## 2011-05-28 MED ORDER — FLUTICASONE PROPIONATE 50 MCG/ACT NA SUSP: 2.0000 | Freq: Every day | NASAL | Status: DC

## 2011-05-28 NOTE — Progress Notes (Signed)
  Subjective:    Patient ID: Tracy Peters, female    DOB: 1933-09-12, 77 y.o.   MRN: [id number]  HPI   The patient is here to follow up on chronic depression, anxiety, headaches and chronic moderate fibromyalgia symptoms controlled with medicines, diet and exercise.  She had an allergic reactions in August x 2 hives all over, itching. She had some hot flashes too  Review of Systems  Constitutional: Negative for chills, activity change, appetite change, fatigue and unexpected weight change.  HENT: Negative for congestion, mouth sores and sinus pressure.   Eyes: Negative for visual disturbance.  Respiratory: Negative for cough and chest tightness.   Gastrointestinal: Negative for nausea and abdominal pain.  Genitourinary: Negative for frequency, difficulty urinating and vaginal pain.  Musculoskeletal: Negative for back pain and gait problem.  Skin: Negative for pallor and rash.  Neurological: Negative for dizziness, tremors, weakness, numbness and headaches.  Psychiatric/Behavioral: Negative for confusion and sleep disturbance.       Objective:   Physical Exam  Constitutional: She appears well-developed and well-nourished. No distress.  HENT:  Head: Normocephalic.  Right Ear: External ear normal.  Left Ear: External ear normal.  Nose: Nose normal.  Mouth/Throat: Oropharynx is clear and moist.  Eyes: Conjunctivae are normal. Pupils are equal, round, and reactive to light. Right eye exhibits no discharge. Left eye exhibits no discharge.  Neck: Normal range of motion. Neck supple. No JVD present. No tracheal deviation present. No thyromegaly present.  Cardiovascular: Normal rate, regular rhythm and normal heart sounds.   Pulmonary/Chest: No stridor. No respiratory distress. She has no wheezes.  Abdominal: Soft. Bowel sounds are normal. She exhibits no distension and no mass. There is no tenderness. There is no rebound and no guarding.  Musculoskeletal: She exhibits no edema and no  tenderness.  Lymphadenopathy:    She has no cervical adenopathy.  Neurological: She displays normal reflexes. No cranial nerve deficit. She exhibits normal muscle tone. Coordination normal.  Skin: No rash noted. No erythema.  Psychiatric: She has a normal mood and affect. Her behavior is normal. Judgment and thought content normal.          Assessment & Plan:

## 2011-05-28 NOTE — Assessment & Plan Note (Signed)
Recurrent - 8/12 - allergic She stopped Vit D - ?better Will d/c vasotec - start ARB

## 2011-05-28 NOTE — Assessment & Plan Note (Signed)
Continue with current prescription therapy as reflected on the Med list.  

## 2011-05-28 NOTE — Telephone Encounter (Signed)
rec vm from pt's pharmacy stating they are concerned that pt thinks she can take her Sudafed for allergic rxn (hives) when in fact this will not help that at all. They state the pt needs a antihistamine for this. I tried calling pt to try to clarify this for pt. NO answer/ Left mess for patient to call back.

## 2011-05-28 NOTE — Assessment & Plan Note (Signed)
Chronic. 

## 2011-05-29 NOTE — Telephone Encounter (Signed)
Left mess for patient to call back.  

## 2011-06-02 NOTE — Telephone Encounter (Signed)
Pt is aware of below.

## 2011-06-24 ENCOUNTER — Other Ambulatory Visit: Payer: 59 | Admitting: Lab

## 2011-06-24 ENCOUNTER — Ambulatory Visit (HOSPITAL_BASED_OUTPATIENT_CLINIC_OR_DEPARTMENT_OTHER): Payer: 59 | Admitting: Oncology

## 2011-06-24 ENCOUNTER — Ambulatory Visit: Payer: 59

## 2011-06-24 DIAGNOSIS — I82409 Acute embolism and thrombosis of unspecified deep veins of unspecified lower extremity: Secondary | ICD-10-CM

## 2011-06-24 DIAGNOSIS — Z86718 Personal history of other venous thrombosis and embolism: Secondary | ICD-10-CM

## 2011-06-24 LAB — POCT INR: INR: 1.9

## 2011-06-24 LAB — PROTIME-INR
INR: 1.9 — ABNORMAL LOW (ref 2.00–3.50)
Protime: 22.8 Seconds — ABNORMAL HIGH (ref 10.6–13.4)

## 2011-06-24 NOTE — Progress Notes (Signed)
INR slightly subtherapeutic.  INR =1.9.  No significant medication changes.  Her PCP did feel that her episodes of diffuse rash and heat sensation in Aug & Sept were an allergic reaction (which she is not convinced about.)  He asked her to change BP medications from enalapril that she had been on for ~15 years to losartan.  She had not made the change yet, and we discussed at length the differences between the two drugs.  I recommended that she choose one, and inform her PCP what she decides so he can update his records.  Will continue Coumadin at current dose of 7.5mg  daily except 5mg  on MWF.  Recheck INR in 2 months.

## 2011-07-14 DIAGNOSIS — H25019 Cortical age-related cataract, unspecified eye: Secondary | ICD-10-CM | POA: Diagnosis not present

## 2011-07-14 DIAGNOSIS — H2510 Age-related nuclear cataract, unspecified eye: Secondary | ICD-10-CM | POA: Diagnosis not present

## 2011-07-15 DIAGNOSIS — L570 Actinic keratosis: Secondary | ICD-10-CM | POA: Diagnosis not present

## 2011-07-15 DIAGNOSIS — R69 Illness, unspecified: Secondary | ICD-10-CM | POA: Diagnosis not present

## 2011-07-28 ENCOUNTER — Other Ambulatory Visit: Payer: Self-pay | Admitting: Pharmacist

## 2011-07-28 DIAGNOSIS — I82409 Acute embolism and thrombosis of unspecified deep veins of unspecified lower extremity: Secondary | ICD-10-CM

## 2011-08-08 ENCOUNTER — Encounter: Payer: Self-pay | Admitting: Internal Medicine

## 2011-08-19 ENCOUNTER — Ambulatory Visit (HOSPITAL_BASED_OUTPATIENT_CLINIC_OR_DEPARTMENT_OTHER): Payer: 59 | Admitting: Pharmacist

## 2011-08-19 ENCOUNTER — Other Ambulatory Visit: Payer: 59 | Admitting: Lab

## 2011-08-19 DIAGNOSIS — I82409 Acute embolism and thrombosis of unspecified deep veins of unspecified lower extremity: Secondary | ICD-10-CM | POA: Diagnosis not present

## 2011-08-19 LAB — PROTIME-INR
INR: 1.7 — ABNORMAL LOW (ref 2.00–3.50)
Protime: 20.4 Seconds — ABNORMAL HIGH (ref 10.6–13.4)

## 2011-08-19 NOTE — Patient Instructions (Signed)
Increase this WED dose to 7.5 mg and resume normal dose of 5mg  on Mon, Wed and Fri with 7.5 mg on other days.  Recheck her INR on 09/16/11 at 4p when she sees MD.

## 2011-08-20 ENCOUNTER — Telehealth: Payer: Self-pay | Admitting: Pharmacist

## 2011-08-20 NOTE — Telephone Encounter (Signed)
Pt called and stated that she is going to start using volteran gel on jaw.

## 2011-08-28 ENCOUNTER — Ambulatory Visit: Payer: 59 | Admitting: Internal Medicine

## 2011-09-16 ENCOUNTER — Other Ambulatory Visit (HOSPITAL_BASED_OUTPATIENT_CLINIC_OR_DEPARTMENT_OTHER): Payer: 59 | Admitting: Lab

## 2011-09-16 ENCOUNTER — Encounter: Payer: Self-pay | Admitting: Oncology

## 2011-09-16 ENCOUNTER — Ambulatory Visit: Payer: 59 | Admitting: Pharmacist

## 2011-09-16 ENCOUNTER — Ambulatory Visit (HOSPITAL_BASED_OUTPATIENT_CLINIC_OR_DEPARTMENT_OTHER): Payer: 59 | Admitting: Oncology

## 2011-09-16 VITALS — BP 139/65 | HR 73 | Temp 97.4°F | Ht 64.5 in | Wt 130.8 lb

## 2011-09-16 DIAGNOSIS — D649 Anemia, unspecified: Secondary | ICD-10-CM

## 2011-09-16 DIAGNOSIS — I742 Embolism and thrombosis of arteries of the upper extremities: Secondary | ICD-10-CM

## 2011-09-16 DIAGNOSIS — Z86718 Personal history of other venous thrombosis and embolism: Secondary | ICD-10-CM

## 2011-09-16 DIAGNOSIS — R634 Abnormal weight loss: Secondary | ICD-10-CM | POA: Diagnosis not present

## 2011-09-16 DIAGNOSIS — R232 Flushing: Secondary | ICD-10-CM

## 2011-09-16 DIAGNOSIS — I82409 Acute embolism and thrombosis of unspecified deep veins of unspecified lower extremity: Secondary | ICD-10-CM

## 2011-09-16 DIAGNOSIS — Z7901 Long term (current) use of anticoagulants: Secondary | ICD-10-CM

## 2011-09-16 HISTORY — DX: Embolism and thrombosis of arteries of the upper extremities: I74.2

## 2011-09-16 LAB — CBC WITH DIFFERENTIAL/PLATELET
Basophils Absolute: 0 10*3/uL (ref 0.0–0.1)
Eosinophils Absolute: 0.1 10*3/uL (ref 0.0–0.5)
HCT: 32.9 % — ABNORMAL LOW (ref 34.8–46.6)
LYMPH%: 41.6 % (ref 14.0–49.7)
MONO#: 0.6 10*3/uL (ref 0.1–0.9)
NEUT#: 4.4 10*3/uL (ref 1.5–6.5)
NEUT%: 50.2 % (ref 38.4–76.8)
Platelets: 287 10*3/uL (ref 145–400)
WBC: 8.8 10*3/uL (ref 3.9–10.3)

## 2011-09-16 LAB — POCT INR: INR: 1.7

## 2011-09-16 NOTE — Progress Notes (Signed)
INR subtherapeutic now x 3 visits, although borderline. I will increase Coumadin slightly to 7.5 mg/day; 5 mg Mon/Fri. Return in 2 weeks. Marily Lente, Pharm.D.

## 2011-09-16 NOTE — Progress Notes (Signed)
Hematology and Oncology Follow Up Visit  Tracy Peters [id number] 05-02-1934 78 y.o. 09/16/2011 6:53 PM   Principle Diagnosis: Encounter Diagnoses  Name Primary?  . Thrombosis of right radial artery   . Normochromic normocytic anemia Yes     Interim History:   Followup visit for this 78 year old woman followed in this office since June 1999 to monitor her Coumadin anticoagulation subsequent to sustaining a arterial thrombosis 2 one of the fingers on her right hand. The patient related this to the concomitant use of a Cox-2 inhibitor drug. A hypercoagulation evaluation was otherwise unremarkable. She has had no subsequent thrombotic events on full dose Coumadin. At time of her visit here in October 2012 she had numerous new complaints. She was getting intermittent abdominal pain, generalized flushing, intense itching of her palms, a macular rash, intermittent dyspnea with a pleuritic component, intermittent palpitations, cold intolerance, weight loss. Thyroid functions were normal. CBC and chemistry profile were normal. I screened her for a carcinoid syndrome. VMA was normal at 3.5 mg per 24 hours, 5-HIAA borderline at 7.6 mg per 24 hours normal less than 6. Since that visit she was reevaluated by her primary care physician. Some adjustments were made in her antihypertensive medications. She has had significant improvement in her constitutional symptoms and resolution of the pulmonary symptoms. Her appetite is good and she remains active but she continues to lose weight, another 3 pounds since her visit here in October. Current weight 131 pounds compared with 152 in September 2009 and 149 in October 2010.  Medications: reviewed  Allergies:  Allergies  Allergen Reactions  . Amlodipine Besylate     REACTION: swelling  . Aspirin     REACTION: unspecified  . Atorvastatin     REACTION: diarrhea  . Cholestyramine     REACTION: mouth irritation  . Codeine Phosphate     REACTION: unspecified    . Diphenhydramine Hcl     REACTION: unspecified  . Doxycycline     REACTION: nausea  . Gentamicin Sulfate     REACTION: unspecified  . Meclizine Hcl     REACTION: more dizziness on it  . Meperidine Hcl   . Metronidazole     REACTION: bad taste  . Moxifloxacin     REACTION: insomnia  . Penicillins     REACTION: unspecified  . Sulfamethoxazole     REACTION: unspecified    Review of Systems: Constitutional:   Constitutional symptoms have resolved except for progressive weight loss Respiratory: Currently no cough or dyspnea Cardiovascular:  No chest pain or palpitations Gastrointestinal: No abdominal pain or diarrhea Genito-Urinary: No urinary tract symptoms Musculoskeletal: No muscle or bone pain Neurologic: No headache or change in vision Skin: No skin rash at present Remaining ROS negative.  Physical Exam: Blood pressure 139/65, pulse 73, temperature 97.4 F (36.3 C), temperature source Oral, height 5' 4.5" (1.638 m), weight 130 lb 12.8 oz (59.33 kg). 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 HENNT: Pharynx no erythema or exudate Lymph nodes: No lymphadenopathy Breasts: Not examined Lungs: Clear to auscultation resonant to percussion Heart: Regular rhythm no murmur Abdomen: Soft nontender no mass no organomegaly Extremities: No edema no calf tenderness Vascular: No cyanosis, radial pulses 2+ symmetric ulnar pulses 1+ symmetric, dorsalis pedis pulse 1+ on the right foot nonpalpable on the left, posterior tibial pulses  1+ and symmetric Neurologic: Pupils equal round reactive to light, optic discs sharp vessels normal, motor strength 5 over 5, reflexes 1+ symmetric, Skin: No rash  Lab Results: Lab Results  Component Value Date   WBC 8.8 09/16/2011   HGB 11.5* 09/16/2011   HCT 32.9* 09/16/2011   MCV 93.5 09/16/2011   PLT  287 09/16/2011     Chemistry      Component Value Date/Time   NA 138 03/17/2011 1046   NA 138 03/17/2011 1046   K 4.5 03/17/2011 1046   K 4.5 03/17/2011 1046   CL 101 03/17/2011 1046   CL 101 03/17/2011 1046   CO2 25 03/17/2011 1046   CO2 25 03/17/2011 1046   BUN 19 03/17/2011 1046   BUN 19 03/17/2011 1046   CREATININE 1.01 03/17/2011 1046   CREATININE 1.01 03/17/2011 1046      Component Value Date/Time   CALCIUM 9.7 03/17/2011 1046   CALCIUM 9.7 03/17/2011 1046   ALKPHOS 47 03/17/2011 1046   ALKPHOS 47 03/17/2011 1046   AST 26 03/17/2011 1046   AST 26 03/17/2011 1046   ALT 18 03/17/2011 1046   ALT 18 03/17/2011 1046   BILITOT 0.5 03/17/2011 1046   BILITOT 0.5 03/17/2011 1046       Impression and Plan: #1. 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 No new thrombotic events on chronic Coumadin anticoagulation. Plan continue the same.  #2. Multiple systemic symptoms as summarized above. Symptoms have improved with some medication changes. Looking at her allergy list, she is sensitive to multiple medications.  #3. Unexplained weight loss. Shows a mild normochromic anemia not present 6 months ago with fall in hemoglobin from 13-11.5 g. This may be just a random fluctuation but I am going to go ahead and screen her for myeloma. She is due for followup colonoscopy in just receive notice about this and will call Dr. August Luz at his office to schedule.   CC:. Dr. Theodis Sato Plotnikov; Dr. Lina Sar   Levert Feinstein, MD 4/2/20136:53 PM

## 2011-09-17 ENCOUNTER — Telehealth: Payer: Self-pay | Admitting: *Deleted

## 2011-09-17 ENCOUNTER — Encounter: Payer: Self-pay | Admitting: Pharmacist

## 2011-09-17 NOTE — Progress Notes (Signed)
Pt called to update her medication list.  She remembered after her visit with the [facility] that her eye doctor started her on omega-3 fatty acids 1 g PO daily.  I took the time to also question her about enalapril filled at an outside facility.  She informed me that she has taken herself off the losartan - she felt she had increased swelling and dizziness with this medication versus the enalapril, which she had been on for years.  She restarted the enalapril cautiously in February since her PCP had switched her last year due to a suspected allergic reaction.  She has not had any problems with allergy-like symptoms since.

## 2011-09-17 NOTE — Telephone Encounter (Signed)
left voice message informing the patient of the new date and time on 03-16-2012 starting at2:00pm

## 2011-09-30 ENCOUNTER — Ambulatory Visit (HOSPITAL_BASED_OUTPATIENT_CLINIC_OR_DEPARTMENT_OTHER): Payer: 59 | Admitting: Pharmacist

## 2011-09-30 ENCOUNTER — Other Ambulatory Visit (HOSPITAL_BASED_OUTPATIENT_CLINIC_OR_DEPARTMENT_OTHER): Payer: 59 | Admitting: Lab

## 2011-09-30 DIAGNOSIS — I82409 Acute embolism and thrombosis of unspecified deep veins of unspecified lower extremity: Secondary | ICD-10-CM

## 2011-09-30 DIAGNOSIS — Z7901 Long term (current) use of anticoagulants: Secondary | ICD-10-CM

## 2011-09-30 LAB — PROTIME-INR
INR: 1.4 — ABNORMAL LOW (ref 2.00–3.50)
Protime: 16.8 Seconds — ABNORMAL HIGH (ref 10.6–13.4)

## 2011-09-30 LAB — POCT INR: INR: 1.4

## 2011-09-30 NOTE — Progress Notes (Signed)
INR continues to be below goal of 2-3.  Pt stated that she stopped drinking a couple months ago after having 1-2 drinks every evening for 30yrs.  This may contribute to need for more coumadin.  Also, warfarin manufacturer changed a couple months ago.  Will call pts pharmacy, [facility], and request pt receive warfarin from same manufacturer.  Per discussion with Dr. Cyndie Chime, no Lovenox needed at this time.  I have increased dose to 10mg  today then 7.5mg  daily.  Check PT/INR next week.

## 2011-10-01 ENCOUNTER — Ambulatory Visit (INDEPENDENT_AMBULATORY_CARE_PROVIDER_SITE_OTHER): Payer: 59 | Admitting: Internal Medicine

## 2011-10-01 ENCOUNTER — Encounter: Payer: Self-pay | Admitting: Internal Medicine

## 2011-10-01 DIAGNOSIS — R634 Abnormal weight loss: Secondary | ICD-10-CM | POA: Diagnosis not present

## 2011-10-01 DIAGNOSIS — L509 Urticaria, unspecified: Secondary | ICD-10-CM | POA: Diagnosis not present

## 2011-10-01 DIAGNOSIS — I10 Essential (primary) hypertension: Secondary | ICD-10-CM | POA: Diagnosis not present

## 2011-10-01 NOTE — Progress Notes (Signed)
Patient ID: Tracy Peters, female   DOB: [birth year], 78 y.o.   MRN: [id number]  Subjective:    Patient ID: Tracy Peters, female    DOB: [birth year], 78 y.o.   MRN: [id number]  HPI   The patient is here to follow up on chronic depression, anxiety, headaches and chronic moderate fibromyalgia symptoms controlled with medicines, diet and exercise.  She had an allergic reactions in August x 2 hives all over, itching. She had some hot flashes too  Wt Readings from Last 3 Encounters:  10/01/11 129 lb (58.514 kg)  09/16/11 130 lb 12.8 oz (59.33 kg)  05/28/11 134 lb (60.782 kg)   BP Readings from Last 3 Encounters:  10/01/11 120/70  09/16/11 139/65  05/28/11 120/62      Review of Systems  Constitutional: Negative for chills, activity change, appetite change, fatigue and unexpected weight change.  HENT: Negative for congestion, mouth sores and sinus pressure.   Eyes: Negative for visual disturbance.  Respiratory: Negative for cough and chest tightness.   Gastrointestinal: Negative for nausea and abdominal pain.  Genitourinary: Negative for frequency, difficulty urinating and vaginal pain.  Musculoskeletal: Negative for back pain and gait problem.  Skin: Negative for pallor and rash.  Neurological: Negative for dizziness, tremors, weakness, numbness and headaches.  Psychiatric/Behavioral: Negative for confusion and sleep disturbance.       Objective:   Physical Exam  Constitutional: She appears well-developed and well-nourished. No distress.  HENT:  Head: Normocephalic.  Right Ear: External ear normal.  Left Ear: External ear normal.  Nose: Nose normal.  Mouth/Throat: Oropharynx is clear and moist.  Eyes: Conjunctivae are normal. Pupils are equal, round, and reactive to light. Right eye exhibits no discharge. Left eye exhibits no discharge.  Neck: Normal range of motion. Neck supple. No JVD present. No tracheal deviation present. No thyromegaly present.  Cardiovascular: Normal  rate, regular rhythm and normal heart sounds.   Pulmonary/Chest: No stridor. No respiratory distress. She has no wheezes.  Abdominal: Soft. Bowel sounds are normal. She exhibits no distension and no mass. There is no tenderness. There is no rebound and no guarding.  Musculoskeletal: She exhibits no edema and no tenderness.  Lymphadenopathy:    She has no cervical adenopathy.  Neurological: She displays normal reflexes. No cranial nerve deficit. She exhibits normal muscle tone. Coordination normal.  Skin: No rash noted. No erythema.  Psychiatric: She has a normal mood and affect. Her behavior is normal. Judgment and thought content normal.    Lab Results  Component Value Date   WBC 8.8 09/16/2011   HGB 11.5* 09/16/2011   HCT 32.9* 09/16/2011   PLT 287 09/16/2011   GLUCOSE 84 03/17/2011   GLUCOSE 84 03/17/2011   CHOL 199 11/19/2010   TRIG 91.0 11/19/2010   HDL 66.30 11/19/2010   LDLDIRECT 127.1 07/26/2010   LDLCALC 115* 11/19/2010   ALT 18 03/17/2011   ALT 18 03/17/2011   AST 26 03/17/2011   AST 26 03/17/2011   NA 138 03/17/2011   NA 138 03/17/2011   K 4.5 03/17/2011   K 4.5 03/17/2011   CL 101 03/17/2011   CL 101 03/17/2011   CREATININE 1.01 03/17/2011   CREATININE 1.01 03/17/2011   BUN 19 03/17/2011   BUN 19 03/17/2011   CO2 25 03/17/2011   CO2 25 03/17/2011   TSH 2.892 03/17/2011   INR 1.40* 09/30/2011         Assessment & Plan:

## 2011-10-01 NOTE — Assessment & Plan Note (Signed)
No relapse - she has meds prn Try again to switch to cozaar

## 2011-10-01 NOTE — Patient Instructions (Signed)
Try Cozaar 1/2 tab a day

## 2011-10-01 NOTE — Assessment & Plan Note (Signed)
Labs Eval by Dr Cyndie Chime Appt w/Dr Juanda Chance is pending

## 2011-10-05 NOTE — Assessment & Plan Note (Signed)
Continue with current prescription therapy as reflected on the Med list.  

## 2011-10-07 ENCOUNTER — Other Ambulatory Visit: Payer: 59 | Admitting: Lab

## 2011-10-07 ENCOUNTER — Ambulatory Visit (HOSPITAL_BASED_OUTPATIENT_CLINIC_OR_DEPARTMENT_OTHER): Payer: 59 | Admitting: Pharmacist

## 2011-10-07 DIAGNOSIS — I82409 Acute embolism and thrombosis of unspecified deep veins of unspecified lower extremity: Secondary | ICD-10-CM | POA: Diagnosis not present

## 2011-10-07 LAB — POCT INR: INR: 2

## 2011-10-07 MED ORDER — WARFARIN SODIUM 5 MG PO TABS: ORAL_TABLET | ORAL | Status: DC

## 2011-10-07 NOTE — Progress Notes (Signed)
INR = 2 today after taking 10 mg x 1 then 7.5 mg/day. She plans to change from enalapril to losartan tomorrow to try the losartan again (per Plotnikov). Colonoscopy in the near future. Coumadin 7.5 mg/day; 10 mg Tuesdays. Return in 2 wks. Marily Lente, Pharm.D.

## 2011-10-08 ENCOUNTER — Telehealth: Payer: Self-pay

## 2011-10-08 MED ORDER — FLUTICASONE PROPIONATE 50 MCG/ACT NA SUSP: 2.0000 | Freq: Every day | NASAL | Status: DC

## 2011-10-08 MED ORDER — DICLOFENAC SODIUM 1 % TD GEL: 1.0000 "application " | TRANSDERMAL | Status: DC | PRN

## 2011-10-08 MED ORDER — LOSARTAN POTASSIUM 100 MG PO TABS: 100.0000 mg | ORAL_TABLET | Freq: Every day | ORAL | Status: DC

## 2011-10-08 MED ORDER — VALACYCLOVIR HCL 500 MG PO TABS: 500.0000 mg | ORAL_TABLET | Freq: Every day | ORAL | Status: DC

## 2011-10-08 MED ORDER — ZOLPIDEM TARTRATE 10 MG PO TABS: 10.0000 mg | ORAL_TABLET | Freq: Every evening | ORAL | Status: DC | PRN

## 2011-10-08 MED ORDER — TRAMADOL HCL 50 MG PO TABS: 50.0000 mg | ORAL_TABLET | Freq: Two times a day (BID) | ORAL | Status: DC | PRN

## 2011-10-08 NOTE — Telephone Encounter (Signed)
Rxs signed and placed upfront for p/u.

## 2011-10-08 NOTE — Telephone Encounter (Signed)
Pt called requesting to pick up Rxs this afternoon or tomorrow morning. Rxs printed and placed on MD's desk for signature.

## 2011-10-16 ENCOUNTER — Encounter: Payer: Self-pay | Admitting: *Deleted

## 2011-10-21 ENCOUNTER — Ambulatory Visit (HOSPITAL_BASED_OUTPATIENT_CLINIC_OR_DEPARTMENT_OTHER): Payer: 59 | Admitting: Pharmacist

## 2011-10-21 ENCOUNTER — Other Ambulatory Visit (HOSPITAL_BASED_OUTPATIENT_CLINIC_OR_DEPARTMENT_OTHER): Payer: 59 | Admitting: Lab

## 2011-10-21 DIAGNOSIS — D649 Anemia, unspecified: Secondary | ICD-10-CM

## 2011-10-21 DIAGNOSIS — I82409 Acute embolism and thrombosis of unspecified deep veins of unspecified lower extremity: Secondary | ICD-10-CM | POA: Diagnosis not present

## 2011-10-21 DIAGNOSIS — D689 Coagulation defect, unspecified: Secondary | ICD-10-CM

## 2011-10-21 LAB — CBC WITH DIFFERENTIAL/PLATELET
BASO%: 0.4 % (ref 0.0–2.0)
HCT: 35.5 % (ref 34.8–46.6)
LYMPH%: 31.4 % (ref 14.0–49.7)
MCHC: 33.4 g/dL (ref 31.5–36.0)
MONO#: 0.6 10*3/uL (ref 0.1–0.9)
NEUT%: 58.6 % (ref 38.4–76.8)
Platelets: 284 10*3/uL (ref 145–400)
WBC: 6.5 10*3/uL (ref 3.9–10.3)

## 2011-10-21 LAB — POCT INR
INR: 2
INR: 2

## 2011-10-21 NOTE — Progress Notes (Signed)
PT will continue on 10 mg Tue and 7.5 mg other days.  She reports a 4 lb weight loss since last visit.  She states she has lost 22 lbs since Sept when she quit drinking.  I asked her to keep a track of her weight loss as it is the unintentional drop in weight that may lead to a further workup.  She has a consultation for her colonoscopy on May 14.  She was instructed to call us if she has any questions about her coumadin dosing prior to her colonoscopy if she is not instructed by that MD.  We will see her back in 3 weeks unless she is scheduled for her colonoscopy before May 28 and she was instructed to call us to make an appmt change to see her prior to the colonoscopy.

## 2011-10-21 NOTE — Patient Instructions (Signed)
Pt will continue with 10mg  on Tue and 7.5 mg on other days.  RTC in 3 weeks on May 28 at 10:30

## 2011-10-24 LAB — IMMUNOFIXATION ELECTROPHORESIS
IgA: 405 mg/dL — ABNORMAL HIGH (ref 69–380)
IgM, Serum: 52 mg/dL (ref 52–322)

## 2011-10-28 ENCOUNTER — Encounter: Payer: Self-pay | Admitting: Internal Medicine

## 2011-10-28 ENCOUNTER — Ambulatory Visit (INDEPENDENT_AMBULATORY_CARE_PROVIDER_SITE_OTHER): Payer: 59 | Admitting: Internal Medicine

## 2011-10-28 VITALS — BP 118/70 | HR 80 | Ht 64.0 in | Wt 128.6 lb

## 2011-10-28 DIAGNOSIS — Z86010 Personal history of colonic polyps: Secondary | ICD-10-CM

## 2011-10-28 DIAGNOSIS — D689 Coagulation defect, unspecified: Secondary | ICD-10-CM

## 2011-10-28 MED ORDER — PEG-KCL-NACL-NASULF-NA ASC-C 100 G PO SOLR: 1.0000 | Freq: Once | ORAL | Status: DC

## 2011-10-28 NOTE — Progress Notes (Signed)
Tracy Peters 1933/10/15 MRN [id number]  History of Present Illness:  This is a 78 year old white female who is here to discuss having a colonoscopy. She is on Coumadin. She had arterial thrombosis to the fingers of her right hand in 1999. Her hypercoagulable workup was negative. She had chronic diarrhea. Her last colonoscopy in February 2008 was done on Coumadin. She had 2 polyps; one of them was adenomatous and another, hyperplastic. She denies any rectal bleeding. She has mild, chronic normocytic anemia. Her last hemoglobin was 11.5, hematocrit was 32.9 and MCV was 93. She was drinking 2-3 drinks of liquor a night until last fall when she stopped and has not had any alcohol since. She was having episodes of sweats and hot flashes which ceased completely after she discontinued alcohol. A CT scan of the abdomen at that time was negative. Her 24-hour urine collection for HIAA this was also normal. She currently denies any diarrhea while taking Imodium one before breakfast and Lomotil 2 tablets after breakfast. Her older sister is currently being evaluated for GI bleeding. She has lost about 16 pounds since stopping alcohol consumption.   Past Medical History  Diagnosis Date  . LBP (low back pain)   . Hypertension   . Encounter for long-term (current) use of anticoagulants     Dr. Cyndie Chime  . History of DVT (deep vein thrombosis)   . GERD (gastroesophageal reflux disease)     mild  . Insomnia   . Herpes   . Allergic rhinitis   . Hyperlipidemia     refusing statins  . Diarrhea     off and on X 6 years  . Adenomatous colon polyp   . IBS (irritable bowel syndrome)   . UTI (urinary tract infection)   . Thrombosis of right radial artery 09/16/2011    Right digital artery 1998 idiopathic    Past Surgical History  Procedure Date  . Cholecystectomy   . Abdominal hysterectomy   . Appendectomy   . Breast surgery     cystectomy-benign  . Lumbar laminectomy     X 2  . Knee surgery    left   . Shoulder surgery     Right    reports that she has never smoked. She has never used smokeless tobacco. She reports that she does not drink alcohol or use illicit drugs. family history includes Breast cancer in her sister; Heart disease in her brother; Kidney disease (age of onset:24) in her sister; Osteoarthritis in her other; Parkinsonism in her mother; and Ulcerative colitis in her sister.  There is no history of Colon cancer. Allergies  Allergen Reactions  . Amlodipine Besylate     REACTION: swelling  . Aspirin     REACTION: unspecified  . Atorvastatin     REACTION: diarrhea  . Cholestyramine     REACTION: mouth irritation  . Codeine Phosphate     REACTION: unspecified  . Demerol (Meperidine)     Severe Hallucination!!!!  . Diphenhydramine Hcl     REACTION: unspecified  . Doxycycline     REACTION: nausea  . Gentamicin Sulfate     REACTION: unspecified  . Meclizine Hcl     REACTION: more dizziness on it  . Meperidine Hcl   . Metronidazole     REACTION: bad taste  . Moxifloxacin     REACTION: insomnia  . Penicillins     REACTION: unspecified  . Sulfamethoxazole     REACTION: unspecified  Review of Systems: Denies hematochezia. Abdominal pain, denies diarrhea or constipation  The remainder of the 10 point ROS is negative except as outlined in H&P   Physical Exam: General appearance  Well developed, in no distress. Appears younger than her stated age Eyes- non icteric. HEENT nontraumatic, normocephalic. Mouth no lesions, tongue papillated, no cheilosis. Neck supple without adenopathy, thyroid not enlarged, no carotid bruits, no JVD. Lungs Clear to auscultation bilaterally. Cor normal S1, normal S2, regular rhythm, no murmur,  quiet precordium. Abdomen: Scaphoid with normal active bowel sounds. Well-healed surgical scar right upper quadrant. Liver edge at costal margin. No ascites. Rectal: Not done. Extremities no pedal edema. Skin no  lesions. Neurological alert and oriented x 3. Psychological normal mood and affect.  Assessment and Plan:  Problem #44 78 year old white female with a history of a adenomatous colon polyp. She is due for a recall colonoscopy. She is on Coumadin for thromboembolic disease. She will have to stay on Coumadin while having the colonoscopy. I have discussed this with the patient and she agrees to proceed with the colonoscopy while taking Coumadin. We have discussed the prep and sedation as well.  10/28/2011 Lina Sar

## 2011-10-28 NOTE — Patient Instructions (Signed)
You have been scheduled for a colonoscopy with propofol. Please follow written instructions given to you at your visit today.  Please pick up your prep kit at the pharmacy within the next 1-3 days. Please continue Coumadin for your procedure as per Dr Regino Schultze recommendations. CC: Dr Trinna Post Plotnikov

## 2011-11-11 ENCOUNTER — Other Ambulatory Visit (HOSPITAL_BASED_OUTPATIENT_CLINIC_OR_DEPARTMENT_OTHER): Payer: 59 | Admitting: Lab

## 2011-11-11 ENCOUNTER — Ambulatory Visit (HOSPITAL_BASED_OUTPATIENT_CLINIC_OR_DEPARTMENT_OTHER): Payer: 59 | Admitting: Pharmacist

## 2011-11-11 DIAGNOSIS — I82409 Acute embolism and thrombosis of unspecified deep veins of unspecified lower extremity: Secondary | ICD-10-CM | POA: Diagnosis not present

## 2011-11-11 NOTE — Progress Notes (Signed)
Take 10mg  today and tomorrow.  Will change to 7.5 mg daily with 10 mg on Tuesday and Fridays.  Return 12/02/11 at 10:30 am for lab; 10:45 am Coumadin clinic.  Pt had consult with Dr. Juanda Chance for her colonoscopy. It is scheduled for July 10.  Dr. Juanda Chance does not plan to stop her coumadin.  She has not stopped it in the past with this procedure on Tracy Peters.  [name] is headed out of town and will be on the water for the next 2 weeks.  Slight increase in her weekly coumadin.  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

## 2011-11-11 NOTE — Patient Instructions (Signed)
Take 10mg  today and tomorrow.  Will change to 7.5 mg daily with 10 mg on Tuesday and Fridays.  Return 12/02/11 at 10:30 am for lab; 10:45 am Coumadin clinic.

## 2011-12-02 ENCOUNTER — Other Ambulatory Visit (HOSPITAL_BASED_OUTPATIENT_CLINIC_OR_DEPARTMENT_OTHER): Payer: 59 | Admitting: Lab

## 2011-12-02 ENCOUNTER — Ambulatory Visit: Payer: 59 | Admitting: Pharmacist

## 2011-12-02 DIAGNOSIS — I82409 Acute embolism and thrombosis of unspecified deep veins of unspecified lower extremity: Secondary | ICD-10-CM

## 2011-12-02 LAB — PROTIME-INR
INR: 3.6 — ABNORMAL HIGH (ref 2.00–3.50)
Protime: 43.2 Seconds — ABNORMAL HIGH (ref 10.6–13.4)

## 2011-12-02 NOTE — Patient Instructions (Addendum)
Decrease Coumadin to 7.5mg  daily except 10mg  on Fridays.  Recheck INR on 6/26.

## 2011-12-02 NOTE — Progress Notes (Signed)
Will slightly decrease dose to 7.5mg  daily except 10mg  on Fridays.  Recheck INR on 6/26.  Will try to keep INR closer to 2.5 in preparation for colonoscopy on 12/24/11 since she is not stopping Coumadin for procedure.

## 2011-12-10 ENCOUNTER — Other Ambulatory Visit: Payer: Self-pay | Admitting: Internal Medicine

## 2011-12-10 ENCOUNTER — Ambulatory Visit (HOSPITAL_BASED_OUTPATIENT_CLINIC_OR_DEPARTMENT_OTHER): Payer: 59 | Admitting: Pharmacist

## 2011-12-10 ENCOUNTER — Other Ambulatory Visit (HOSPITAL_BASED_OUTPATIENT_CLINIC_OR_DEPARTMENT_OTHER): Payer: 59 | Admitting: Lab

## 2011-12-10 DIAGNOSIS — I82409 Acute embolism and thrombosis of unspecified deep veins of unspecified lower extremity: Secondary | ICD-10-CM

## 2011-12-10 NOTE — Progress Notes (Signed)
INR = 1.9 on 7.5 mg/day; 10 mg on Fri. She is on Losartan 50 mg/day now.  Has been on it for 4 days.  Stopped Lisinopril. Losartan is Moderate Inhibitor of CYP2C9.  Warfarin is a Substrate of CYP2C9.  There is potential that the INR could be increased with this drug combination. Pt having colonoscopy on 12/24/11.  Plan is to continue Coumadin around time of procedure. In an attempt to keep INR closer to 2.5 & given the fact that pt is on Losartan now, I will leave her dose the same & have her return 2 days before her colonoscopy for repeat INR. Marily Lente, Pharm.D.

## 2011-12-12 ENCOUNTER — Other Ambulatory Visit: Payer: Self-pay | Admitting: Internal Medicine

## 2011-12-12 NOTE — Telephone Encounter (Signed)
Ok to Rf? 

## 2011-12-15 ENCOUNTER — Other Ambulatory Visit: Payer: Self-pay | Admitting: Internal Medicine

## 2011-12-15 ENCOUNTER — Telehealth: Payer: Self-pay | Admitting: *Deleted

## 2011-12-15 MED ORDER — TRAMADOL HCL 50 MG PO TABS: 50.0000 mg | ORAL_TABLET | Freq: Two times a day (BID) | ORAL | Status: DC | PRN

## 2011-12-15 NOTE — Telephone Encounter (Signed)
Done

## 2011-12-15 NOTE — Telephone Encounter (Signed)
OK to fill this prescription with additional refills x3 Thank you!  

## 2011-12-15 NOTE — Telephone Encounter (Signed)
Rf req for Tramadol 50 mg 1-2 po bid prn. # 60. Ok to Rf? 

## 2011-12-22 ENCOUNTER — Telehealth: Payer: Self-pay | Admitting: Oncology

## 2011-12-22 ENCOUNTER — Ambulatory Visit (HOSPITAL_BASED_OUTPATIENT_CLINIC_OR_DEPARTMENT_OTHER): Payer: 59 | Admitting: Pharmacist

## 2011-12-22 ENCOUNTER — Other Ambulatory Visit (HOSPITAL_BASED_OUTPATIENT_CLINIC_OR_DEPARTMENT_OTHER): Payer: 59 | Admitting: Lab

## 2011-12-22 DIAGNOSIS — I82409 Acute embolism and thrombosis of unspecified deep veins of unspecified lower extremity: Secondary | ICD-10-CM

## 2011-12-22 DIAGNOSIS — Z7901 Long term (current) use of anticoagulants: Secondary | ICD-10-CM | POA: Diagnosis not present

## 2011-12-22 DIAGNOSIS — Z5181 Encounter for therapeutic drug level monitoring: Secondary | ICD-10-CM

## 2011-12-22 LAB — PROTIME-INR: INR: 2 (ref 2.00–3.50)

## 2011-12-22 NOTE — Telephone Encounter (Signed)
Pt came in today to check on f/u appt. Added lb for 10/1 and gv pt 10/1 appt for lb/fu @ 1:30 pm.

## 2011-12-22 NOTE — Progress Notes (Signed)
INR = 2 taking Coumadin 7.5 mg/day; 10 mg Fri. Only c/o B ankle swelling today.  She has noticed this change since going on Losartan.  No other effects from the drug otherwise.  She sees PCP on 01/07/12. Colonoscopy this week; will cont Coumadin throughout. INR therapeutic. Cont same dose. Repeat protime in 1 month.

## 2011-12-24 ENCOUNTER — Ambulatory Visit (AMBULATORY_SURGERY_CENTER): Payer: 59 | Admitting: Internal Medicine

## 2011-12-24 ENCOUNTER — Encounter: Payer: Self-pay | Admitting: Internal Medicine

## 2011-12-24 VITALS — BP 141/75 | HR 79 | Temp 97.6°F | Resp 18 | Ht 64.0 in | Wt 128.0 lb

## 2011-12-24 DIAGNOSIS — K219 Gastro-esophageal reflux disease without esophagitis: Secondary | ICD-10-CM | POA: Diagnosis not present

## 2011-12-24 DIAGNOSIS — Z860100 Personal history of colon polyps, unspecified: Secondary | ICD-10-CM

## 2011-12-24 DIAGNOSIS — I10 Essential (primary) hypertension: Secondary | ICD-10-CM | POA: Diagnosis not present

## 2011-12-24 DIAGNOSIS — K589 Irritable bowel syndrome without diarrhea: Secondary | ICD-10-CM | POA: Diagnosis not present

## 2011-12-24 DIAGNOSIS — E785 Hyperlipidemia, unspecified: Secondary | ICD-10-CM | POA: Diagnosis not present

## 2011-12-24 MED ORDER — SODIUM CHLORIDE 0.9 % IV SOLN: 500.0000 mL | INTRAVENOUS | Status: DC

## 2011-12-24 NOTE — Op Note (Addendum)
[facility] [address] [place], [place]  [address]  COLONOSCOPY PROCEDURE REPORT  PATIENT:  Tracy Peters, Tracy Peters  MR#:  [id number] BIRTHDATE:  26-Jan-1934, 78 yrs. old  GENDER:  female ENDOSCOPIST:  Hedwig Morton. Juanda Chance, MD REF. BY:  Dr Marlena Clipper PROCEDURE DATE:  12/24/2011 PROCEDURE:  Colonoscopy 19147 ASA CLASS:  Class II INDICATIONS:  history of hyperplastic polyps colon 07/2006- hyperplastic polyp of hypercoagulable state- she is on Coumadin MEDICATIONS:   MAC sedation, administered by CRNA, propofol (Diprivan) 200 mg  DESCRIPTION OF PROCEDURE:   After the risks and benefits and of the procedure were explained, informed consent was obtained. Digital rectal exam was performed and revealed no rectal masses. The LB [id number]-[id number] endoscope was introduced through the anus and advanced to the cecum, which was identified by both the appendix and ileocecal valve.  The quality of the prep was good, using MoviPrep.  The instrument was then slowly withdrawn as the colon was fully examined. <<PROCEDUREIMAGES>>  FINDINGS:  Mild diverticulosis was found throughout the colon (see image4).  This was otherwise a normal examination of the colon (see image2, image1, image3, and image5).   Retroflexed views in the rectum revealed no abnormalities.    The scope was then withdrawn from the patient and the procedure completed.  COMPLICATIONS:  None ENDOSCOPIC IMPRESSION: 1) Mild diverticulosis throughout the colon 2) Otherwise normal examination RECOMMENDATIONS: 1) High fiber diet. continue Coumadin  REPEAT EXAM:  In 10 year(s) for.  ______________________________ Hedwig Morton. Juanda Chance, MD  CC:  Linda Hedges. Plotnikov, MD  n. REVISED:  12/24/2011 09:14 AM eSIGNED:   Hedwig Morton. Mylinda Brook at 12/24/2011 09:14 AM  Jamse Mead, [phone]

## 2011-12-24 NOTE — Progress Notes (Signed)
Patient did not have preoperative order for IV antibiotic SSI prophylaxis. (G8918)  Patient did not experience any of the following events: a burn prior to discharge; a fall within the facility; wrong site/side/patient/procedure/implant event; or a hospital transfer or hospital admission upon discharge from the facility. (G8907)  

## 2011-12-24 NOTE — Patient Instructions (Signed)
YOU HAD AN ENDOSCOPIC PROCEDURE TODAY AT THE [facility]: Refer to the procedure report that was given to you for any specific questions about what was found during the examination.  If the procedure report does not answer your questions, please call your gastroenterologist to clarify.  If you requested that your care partner not be given the details of your procedure findings, then the procedure report has been included in a sealed envelope for you to review at your convenience later.  YOU SHOULD EXPECT: Some feelings of bloating in the abdomen. Passage of more gas than usual.  Walking can help get rid of the air that was put into your GI tract during the procedure and reduce the bloating. If you had a lower endoscopy (such as a colonoscopy or flexible sigmoidoscopy) you may notice spotting of blood in your stool or on the toilet paper. If you underwent a bowel prep for your procedure, then you may not have a normal bowel movement for a few days.  DIET: Your first meal following the procedure should be a light meal and then it is ok to progress to your normal diet.  A half-sandwich or bowl of soup is an example of a good first meal.  Heavy or fried foods are harder to digest and may make you feel nauseous or bloated.  Likewise meals heavy in dairy and vegetables can cause extra gas to form and this can also increase the bloating.  Drink plenty of fluids but you should avoid alcoholic beverages for 24 hours.  ACTIVITY: Your care partner should take you home directly after the procedure.  You should plan to take it easy, moving slowly for the rest of the day.  You can resume normal activity the day after the procedure however you should NOT DRIVE or use heavy machinery for 24 hours (because of the sedation medicines used during the test).    SYMPTOMS TO REPORT IMMEDIATELY: A gastroenterologist can be reached at any hour.  During normal business hours, 8:30 AM to 5:00 PM Monday through Friday,  call [phone].  After hours and on weekends, please call the GI answering service at [phone] who will take a message and have the physician on call contact you.   Following lower endoscopy (colonoscopy or flexible sigmoidoscopy):  Excessive amounts of blood in the stool  Significant tenderness or worsening of abdominal pains  Swelling of the abdomen that is new, acute  Fever of 100F or higher    FOLLOW UP: If any biopsies were taken you will be contacted by phone or by letter within the next 1-3 weeks.  Call your gastroenterologist if you have not heard about the biopsies in 3 weeks.  Our staff will call the home number listed on your records the next business day following your procedure to check on you and address any questions or concerns that you may have at that time regarding the information given to you following your procedure. This is a courtesy call and so if there is no answer at the home number and we have not heard from you through the emergency physician on call, we will assume that you have returned to your regular daily activities without incident.  SIGNATURES/CONFIDENTIALITY: You and/or your care partner have signed paperwork which will be entered into your electronic medical record.  These signatures attest to the fact that that the information above on your After Visit Summary has been reviewed and is understood.  Full responsibility of the confidentiality   of this discharge information lies with you and/or your care-partner.     Information on diverticulosis & high fiber diet given to you today 

## 2011-12-25 ENCOUNTER — Telehealth: Payer: Self-pay | Admitting: *Deleted

## 2011-12-25 NOTE — Telephone Encounter (Signed)
  Follow up Call-  Call back number 12/24/2011  Post procedure Call Back phone  # [phone]  Permission to leave phone message Yes     Patient questions:  Do you have a fever, pain , or abdominal swelling? no Pain Score  0 *  Have you tolerated food without any problems? yes  Have you been able to return to your normal activities? yes  Do you have any questions about your discharge instructions: Diet   no Medications  no Follow up visit  no  Do you have questions or concerns about your Care? no  Actions: * If pain score is 4 or above: No action needed, pain <4.

## 2012-01-07 ENCOUNTER — Ambulatory Visit: Payer: 59 | Admitting: Internal Medicine

## 2012-01-13 ENCOUNTER — Encounter: Payer: Self-pay | Admitting: Internal Medicine

## 2012-01-13 ENCOUNTER — Other Ambulatory Visit: Payer: Self-pay | Admitting: *Deleted

## 2012-01-13 ENCOUNTER — Ambulatory Visit (INDEPENDENT_AMBULATORY_CARE_PROVIDER_SITE_OTHER): Payer: 59 | Admitting: Internal Medicine

## 2012-01-13 VITALS — BP 136/62 | HR 72 | Temp 97.5°F | Resp 16 | Wt 125.0 lb

## 2012-01-13 DIAGNOSIS — G4700 Insomnia, unspecified: Secondary | ICD-10-CM | POA: Diagnosis not present

## 2012-01-13 DIAGNOSIS — Z86718 Personal history of other venous thrombosis and embolism: Secondary | ICD-10-CM

## 2012-01-13 DIAGNOSIS — I10 Essential (primary) hypertension: Secondary | ICD-10-CM

## 2012-01-13 DIAGNOSIS — M545 Low back pain: Secondary | ICD-10-CM

## 2012-01-13 DIAGNOSIS — I742 Embolism and thrombosis of arteries of the upper extremities: Secondary | ICD-10-CM

## 2012-01-13 DIAGNOSIS — E785 Hyperlipidemia, unspecified: Secondary | ICD-10-CM

## 2012-01-13 DIAGNOSIS — I82409 Acute embolism and thrombosis of unspecified deep veins of unspecified lower extremity: Secondary | ICD-10-CM | POA: Diagnosis not present

## 2012-01-13 MED ORDER — LOSARTAN POTASSIUM 100 MG PO TABS: 50.0000 mg | ORAL_TABLET | Freq: Every day | ORAL | Status: DC

## 2012-01-13 MED ORDER — DIPHENOXYLATE-ATROPINE 2.5-0.025 MG PO TABS: 1.0000 | ORAL_TABLET | Freq: Two times a day (BID) | ORAL | Status: DC | PRN

## 2012-01-13 MED ORDER — TRAMADOL HCL 50 MG PO TABS: 50.0000 mg | ORAL_TABLET | Freq: Two times a day (BID) | ORAL | Status: DC | PRN

## 2012-01-13 MED ORDER — VALACYCLOVIR HCL 500 MG PO TABS: 500.0000 mg | ORAL_TABLET | Freq: Every day | ORAL | Status: DC

## 2012-01-13 MED ORDER — FLUTICASONE PROPIONATE 50 MCG/ACT NA SUSP: 2.0000 | Freq: Every day | NASAL | Status: DC

## 2012-01-13 MED ORDER — ZOLPIDEM TARTRATE 10 MG PO TABS: 10.0000 mg | ORAL_TABLET | Freq: Every evening | ORAL | Status: DC | PRN

## 2012-01-13 MED ORDER — EPINEPHRINE 0.3 MG/0.3ML IJ DEVI: INTRAMUSCULAR | Status: DC

## 2012-01-13 MED ORDER — WARFARIN SODIUM 5 MG PO TABS: ORAL_TABLET | ORAL | Status: DC

## 2012-01-13 NOTE — Progress Notes (Signed)
   Subjective:    Patient ID: Tracy Peters, female    DOB: 07/10/1933, 78 y.o.   MRN: [id number]  HPI   The patient is here to follow up on chronic depression, anxiety, headaches and chronic moderate fibromyalgia symptoms controlled with medicines, diet and exercise. Her sister died 2 wks ago.  She had an allergic reactions in August 2012 x 2 hives all over, itching. No hives this summer.   Wt Readings from Last 3 Encounters:  01/13/12 125 lb (56.7 kg)  12/24/11 128 lb (58.06 kg)  10/28/11 128 lb 9.6 oz (58.333 kg)   BP Readings from Last 3 Encounters:  01/13/12 136/62  12/24/11 141/75  10/28/11 118/70      Review of Systems  Constitutional: Negative for chills, activity change, appetite change, fatigue and unexpected weight change.  HENT: Negative for congestion, mouth sores and sinus pressure.   Eyes: Negative for visual disturbance.  Respiratory: Negative for cough and chest tightness.   Gastrointestinal: Negative for nausea and abdominal pain.  Genitourinary: Negative for frequency, difficulty urinating and vaginal pain.  Musculoskeletal: Negative for back pain and gait problem.  Skin: Negative for pallor and rash.  Neurological: Negative for dizziness, tremors, weakness, numbness and headaches.  Psychiatric/Behavioral: Negative for confusion and disturbed wake/sleep cycle.       Objective:   Physical Exam  Constitutional: She appears well-developed and well-nourished. No distress.  HENT:  Head: Normocephalic.  Right Ear: External ear normal.  Left Ear: External ear normal.  Nose: Nose normal.  Mouth/Throat: Oropharynx is clear and moist.  Eyes: Conjunctivae are normal. Pupils are equal, round, and reactive to light. Right eye exhibits no discharge. Left eye exhibits no discharge.  Neck: Normal range of motion. Neck supple. No JVD present. No tracheal deviation present. No thyromegaly present.  Cardiovascular: Normal rate, regular rhythm and normal heart sounds.    Pulmonary/Chest: No stridor. No respiratory distress. She has no wheezes.  Abdominal: Soft. Bowel sounds are normal. She exhibits no distension and no mass. There is no tenderness. There is no rebound and no guarding.  Musculoskeletal: She exhibits no edema and no tenderness.  Lymphadenopathy:    She has no cervical adenopathy.  Neurological: She displays normal reflexes. No cranial nerve deficit. She exhibits normal muscle tone. Coordination normal.  Skin: No rash noted. No erythema.  Psychiatric: She has a normal mood and affect. Her behavior is normal. Judgment and thought content normal.    Lab Results  Component Value Date   WBC 6.5 10/21/2011   HGB 11.8 10/21/2011   HCT 35.5 10/21/2011   PLT 284 10/21/2011   GLUCOSE 84 03/17/2011   GLUCOSE 84 03/17/2011   CHOL 199 11/19/2010   TRIG 91.0 11/19/2010   HDL 66.30 11/19/2010   LDLDIRECT 127.1 07/26/2010   LDLCALC 115* 11/19/2010   ALT 18 03/17/2011   ALT 18 03/17/2011   AST 26 03/17/2011   AST 26 03/17/2011   NA 138 03/17/2011   NA 138 03/17/2011   K 4.5 03/17/2011   K 4.5 03/17/2011   CL 101 03/17/2011   CL 101 03/17/2011   CREATININE 1.01 03/17/2011   CREATININE 1.01 03/17/2011   BUN 19 03/17/2011   BUN 19 03/17/2011   CO2 25 03/17/2011   CO2 25 03/17/2011   TSH 2.892 03/17/2011   INR 2.00 12/22/2011         Assessment & Plan:

## 2012-01-13 NOTE — Assessment & Plan Note (Signed)
Continue with current prescription therapy as reflected on the Med list.  

## 2012-01-13 NOTE — Addendum Note (Signed)
Addended by: Tresa Garter on: 01/13/2012 02:13 PM   Modules accepted: Orders

## 2012-01-13 NOTE — Assessment & Plan Note (Signed)
No recurrence. 

## 2012-01-13 NOTE — Assessment & Plan Note (Signed)
Fish oil po

## 2012-01-13 NOTE — Assessment & Plan Note (Signed)
Stress related 

## 2012-01-20 ENCOUNTER — Other Ambulatory Visit (HOSPITAL_BASED_OUTPATIENT_CLINIC_OR_DEPARTMENT_OTHER): Payer: 59 | Admitting: Lab

## 2012-01-20 ENCOUNTER — Ambulatory Visit (HOSPITAL_BASED_OUTPATIENT_CLINIC_OR_DEPARTMENT_OTHER): Payer: 59 | Admitting: Pharmacist

## 2012-01-20 DIAGNOSIS — I82409 Acute embolism and thrombosis of unspecified deep veins of unspecified lower extremity: Secondary | ICD-10-CM

## 2012-01-20 LAB — POCT INR: INR: 2.1

## 2012-01-20 LAB — PROTIME-INR
INR: 2.1 (ref 2.00–3.50)
Protime: 25.2 Seconds — ABNORMAL HIGH (ref 10.6–13.4)

## 2012-01-20 NOTE — Progress Notes (Signed)
INR at goal of 2-3.  No changes to report.  Pt's sister died unexpectedly 2 weeks ago.  Continue 7.5mg  daily but 10mg  on Fri.  Check PT/INR in 1 month.

## 2012-02-24 DIAGNOSIS — D1801 Hemangioma of skin and subcutaneous tissue: Secondary | ICD-10-CM | POA: Diagnosis not present

## 2012-02-24 DIAGNOSIS — L820 Inflamed seborrheic keratosis: Secondary | ICD-10-CM | POA: Diagnosis not present

## 2012-02-24 DIAGNOSIS — D235 Other benign neoplasm of skin of trunk: Secondary | ICD-10-CM | POA: Diagnosis not present

## 2012-03-02 ENCOUNTER — Ambulatory Visit (HOSPITAL_BASED_OUTPATIENT_CLINIC_OR_DEPARTMENT_OTHER): Payer: 59 | Admitting: Pharmacist

## 2012-03-02 ENCOUNTER — Other Ambulatory Visit (HOSPITAL_BASED_OUTPATIENT_CLINIC_OR_DEPARTMENT_OTHER): Payer: 59 | Admitting: Lab

## 2012-03-02 DIAGNOSIS — I82409 Acute embolism and thrombosis of unspecified deep veins of unspecified lower extremity: Secondary | ICD-10-CM

## 2012-03-02 LAB — PROTIME-INR

## 2012-03-02 NOTE — Progress Notes (Signed)
INR therapeutic today (2.6) on 7.5mg  daily except 10mg  on Friday. No changes in meds or diet.  May be eating more seafood.   No problems with bleeding or bruising.  Doing well. Continue current dose. Recheck INR with next lab/MD visit on 03/16/12 (may return to 6-8 wk intervals after her visit in October)

## 2012-03-16 ENCOUNTER — Ambulatory Visit: Payer: 59 | Admitting: Pharmacist

## 2012-03-16 ENCOUNTER — Telehealth: Payer: Self-pay | Admitting: Oncology

## 2012-03-16 ENCOUNTER — Ambulatory Visit (HOSPITAL_BASED_OUTPATIENT_CLINIC_OR_DEPARTMENT_OTHER): Payer: 59 | Admitting: Oncology

## 2012-03-16 ENCOUNTER — Other Ambulatory Visit (HOSPITAL_BASED_OUTPATIENT_CLINIC_OR_DEPARTMENT_OTHER): Payer: 59 | Admitting: Lab

## 2012-03-16 VITALS — BP 141/82 | HR 66 | Temp 97.0°F | Resp 20 | Ht 64.0 in | Wt 127.7 lb

## 2012-03-16 DIAGNOSIS — Z7901 Long term (current) use of anticoagulants: Secondary | ICD-10-CM

## 2012-03-16 DIAGNOSIS — I742 Embolism and thrombosis of arteries of the upper extremities: Secondary | ICD-10-CM

## 2012-03-16 DIAGNOSIS — D649 Anemia, unspecified: Secondary | ICD-10-CM

## 2012-03-16 LAB — CBC WITH DIFFERENTIAL/PLATELET
BASO%: 0.5 % (ref 0.0–2.0)
Basophils Absolute: 0 10*3/uL (ref 0.0–0.1)
EOS%: 1.1 % (ref 0.0–7.0)
HCT: 36.2 % (ref 34.8–46.6)
HGB: 12 g/dL (ref 11.6–15.9)
LYMPH%: 35.5 % (ref 14.0–49.7)
MCH: 32.9 pg (ref 25.1–34.0)
MCHC: 33.1 g/dL (ref 31.5–36.0)
MCV: 99.2 fL (ref 79.5–101.0)
NEUT%: 55.7 % (ref 38.4–76.8)
Platelets: 263 10*3/uL (ref 145–400)
lymph#: 2.9 10*3/uL (ref 0.9–3.3)

## 2012-03-16 LAB — BASIC METABOLIC PANEL (CC13)
CO2: 24 mEq/L (ref 22–29)
Calcium: 9.6 mg/dL (ref 8.4–10.4)
Chloride: 97 mEq/L — ABNORMAL LOW (ref 98–107)
Creatinine: 0.9 mg/dL (ref 0.6–1.1)
Glucose: 84 mg/dl (ref 70–99)

## 2012-03-16 LAB — PROTIME-INR

## 2012-03-16 NOTE — Patient Instructions (Addendum)
Seen by MD. Stay on current dose.

## 2012-03-16 NOTE — Telephone Encounter (Signed)
Gave pt appt for November 2013 lab and Coumadin, pt will see Dr. Reece Agar in October 2014 and lab 1 week before md visit

## 2012-03-17 NOTE — Progress Notes (Signed)
Hematology and Oncology Follow Up Visit  Tracy Peters [id number] 01-Aug-1933 77 y.o. 03/17/2012 7:01 PM   Principle Diagnosis: Encounter Diagnoses  Name Primary?  . Thrombosis of right radial artery Yes  . Chronic anticoagulation      Interim History:   Followup visit for this 78 year old woman followed in this office since June 1999 to monitor her anticoagulation.  She sustained an idiopathic arterial embolus to one  of her fingers at that time.  It was not clear whether this was related to concomitant use of a COX-2 inhibitor drug.  She has had no subsequent thrombotic events on long-term therapeutic Coumadin.   At time of most recent visit here in October of 2012, she had a number of complaints including unexplained weight loss, diarrhea, flushing spells,  intermittent rashes, itching of her palms, intermittent abdominal pain.  Marland Kitchen She was evaluated by Dr. Lina Sar in November 2011.  Symptoms felt likely secondary to chronic irritable bowel syndrome.  She had a colonoscopy that was negative except for a few benign polyps.  Dr. Juanda Chance also ordered a small bowel series.  This was done 05/16/2010 and was normal.  Mucosal pattern in the terminal ileum was unremarkable.    I obtained a CT scan of her abdomen and pelvis 04/07/2011 looking for signs of a carcinoid tumor. She had sigmoid diverticulosis, stable atrophy of her left kidney, no signs of any intra-abdominal tumor. Urine for VMA was normal. 5 - HIAA was borderline elevated at 7.6 mg normal less than 6. TSH was normal at 2.89 on 03/17/2011. T.-4, 5.1, lower limit of normal. She had a recent colonoscopy done 12/24/2011 which is unremarkable except for diverticulosis.  Her symptoms have subsided. Her weight has stabilized but has never come back up to her previous weight of 154 pounds back in April of 2011. Current weight is 128 pounds compared with 135 pounds in June of 2012  Overall she feels much better than she did when I saw her last  year. Main complaint is persistent pain in the right thigh status post a fall when she missed a step getting off her boat..  Medications: reviewed  Allergies:  Allergies  Allergen Reactions  . Ivp Dye (Iodinated Diagnostic Agents) Anaphylaxis  . Amlodipine Besylate     REACTION: swelling  . Aspirin     REACTION: unspecified  . Atorvastatin     REACTION: diarrhea  . Cholestyramine     REACTION: mouth irritation  . Codeine Phosphate     REACTION: unspecified  . Demerol (Meperidine)     Severe Hallucination!!!!  . Diphenhydramine Hcl     REACTION: unspecified  . Doxycycline     REACTION: nausea  . Gentamicin Sulfate     REACTION: unspecified  . Meclizine Hcl     REACTION: more dizziness on it  . Meperidine Hcl   . Metronidazole     REACTION: bad taste  . Moxifloxacin     REACTION: insomnia  . Penicillins     REACTION: unspecified  . Sulfamethoxazole     REACTION: unspecified    Review of Systems: Constitutional:   No constitutional symptoms but she continues to lose weight Respiratory: No cough or dyspnea Cardiovascular:  No chest pain or palpitations Gastrointestinal: She still has intermittent loose stools Genito-Urinary: No GU complaints Musculoskeletal: See above Neurologic: No headache or change in vision Skin: No rash or ecchymosis Remaining ROS negative.  Physical Exam: Blood pressure 141/82, pulse 66, temperature 97 F (36.1 C), temperature source  Oral, resp. rate 20, height 5\' 4"  (1.626 m), weight 127 lb 11.2 oz (57.924 kg). Wt Readings from Last 3 Encounters:  03/16/12 127 lb 11.2 oz (57.924 kg)  01/13/12 125 lb (56.7 kg)  12/24/11 128 lb (58.06 kg)     General appearance: Thin Caucasian woman HENNT: Pharynx no erythema or exudate Lymph nodes: No adenopathy Breasts: Not examined Lungs: Clear to auscultation resonant to percussion Heart: Regular rhythm no murmur Abdomen: Soft, nontender, no mass, no organomegaly Extremities: No edema, no calf  tenderness Vascular: No cyanosis. Radial pulses are 2+ symmetric I could not palpate a left ulnar pulse right ulnar pulse was 2+ right dorsalis pedis pulse 1+ left not palpable Neurologic: No focal deficit Skin: No rash or ecchymosis  Lab Results: Lab Results  Component Value Date   WBC 8.3 03/16/2012   HGB 12.0 03/16/2012   HCT 36.2 03/16/2012   MCV 99.2 03/16/2012   PLT 263 03/16/2012     Chemistry      Component Value Date/Time   NA 130* 03/16/2012 1344   NA 138 03/17/2011 1046   NA 138 03/17/2011 1046   K 4.7 03/16/2012 1344   K 4.5 03/17/2011 1046   K 4.5 03/17/2011 1046   CL 97* 03/16/2012 1344   CL 101 03/17/2011 1046   CL 101 03/17/2011 1046   CO2 24 03/16/2012 1344   CO2 25 03/17/2011 1046   CO2 25 03/17/2011 1046   BUN 20.0 03/16/2012 1344   BUN 19 03/17/2011 1046   BUN 19 03/17/2011 1046   CREATININE 0.9 03/16/2012 1344   CREATININE 1.01 03/17/2011 1046   CREATININE 1.01 03/17/2011 1046      Component Value Date/Time   CALCIUM 9.6 03/16/2012 1344   CALCIUM 9.7 03/17/2011 1046   CALCIUM 9.7 03/17/2011 1046   ALKPHOS 47 03/17/2011 1046   ALKPHOS 47 03/17/2011 1046   AST 26 03/17/2011 1046   AST 26 03/17/2011 1046   ALT 18 03/17/2011 1046   ALT 18 03/17/2011 1046   BILITOT 0.5 03/17/2011 1046   BILITOT 0.5 03/17/2011 1046       Radiological Studies: No results found.  Impression and Plan: #1. Idiopathic arterial thrombosis She remains on chronic therapeutic Coumadin. a digit on her right hand in 1999. No recurrent thrombotic events  #2. Irritable bowel syndrome/chronic unexplained weight loss Unremarkable GI evaluation to date except for diverticulosis.  #3. Essential hypertension.  #4. History of genital herpes    CC:. Dr. Trinna Post Plotnikov; Dr. Pricilla Handler, MD 10/2/20137:01 PM

## 2012-03-30 ENCOUNTER — Ambulatory Visit: Payer: 59

## 2012-04-06 ENCOUNTER — Ambulatory Visit (INDEPENDENT_AMBULATORY_CARE_PROVIDER_SITE_OTHER): Payer: 59 | Admitting: *Deleted

## 2012-04-06 DIAGNOSIS — Z23 Encounter for immunization: Secondary | ICD-10-CM

## 2012-04-14 DIAGNOSIS — M76899 Other specified enthesopathies of unspecified lower limb, excluding foot: Secondary | ICD-10-CM | POA: Diagnosis not present

## 2012-04-27 ENCOUNTER — Other Ambulatory Visit: Payer: 59 | Admitting: Lab

## 2012-04-27 ENCOUNTER — Ambulatory Visit: Payer: 59

## 2012-05-25 ENCOUNTER — Ambulatory Visit: Payer: 59 | Admitting: Pharmacist

## 2012-05-25 ENCOUNTER — Other Ambulatory Visit (HOSPITAL_BASED_OUTPATIENT_CLINIC_OR_DEPARTMENT_OTHER): Payer: 59 | Admitting: Lab

## 2012-05-25 DIAGNOSIS — I82409 Acute embolism and thrombosis of unspecified deep veins of unspecified lower extremity: Secondary | ICD-10-CM

## 2012-05-25 LAB — PROTIME-INR
INR: 2.5 (ref 2.00–3.50)
Protime: 30 Seconds — ABNORMAL HIGH (ref 10.6–13.4)

## 2012-05-25 NOTE — Progress Notes (Signed)
INR therapeutic today (2.5) on 7.5mg  daily except 10mg  on Friday. No changes in meds or diet. No significant bruising - she did have a knot on her head where she bumped it really hard, but this has resolved.  No other complaints. Continue current dose.  Recheck INR in 2 months.

## 2012-05-31 ENCOUNTER — Other Ambulatory Visit (INDEPENDENT_AMBULATORY_CARE_PROVIDER_SITE_OTHER): Payer: 59

## 2012-05-31 DIAGNOSIS — M545 Low back pain, unspecified: Secondary | ICD-10-CM

## 2012-05-31 DIAGNOSIS — Z86718 Personal history of other venous thrombosis and embolism: Secondary | ICD-10-CM

## 2012-05-31 DIAGNOSIS — G4700 Insomnia, unspecified: Secondary | ICD-10-CM

## 2012-05-31 DIAGNOSIS — E785 Hyperlipidemia, unspecified: Secondary | ICD-10-CM

## 2012-05-31 DIAGNOSIS — I82409 Acute embolism and thrombosis of unspecified deep veins of unspecified lower extremity: Secondary | ICD-10-CM | POA: Diagnosis not present

## 2012-05-31 DIAGNOSIS — I742 Embolism and thrombosis of arteries of the upper extremities: Secondary | ICD-10-CM | POA: Diagnosis not present

## 2012-05-31 DIAGNOSIS — I10 Essential (primary) hypertension: Secondary | ICD-10-CM

## 2012-05-31 LAB — LIPID PANEL
Cholesterol: 209 mg/dL — ABNORMAL HIGH (ref 0–200)
HDL: 61.5 mg/dL (ref >39.00)
Total CHOL/HDL Ratio: 3
Triglycerides: 101 mg/dL (ref 0.0–149.0)
VLDL: 20.2 mg/dL (ref 0.0–40.0)

## 2012-05-31 LAB — URINALYSIS
Bilirubin Urine: NEGATIVE
Ketones, ur: NEGATIVE
Leukocytes, UA: NEGATIVE
Specific Gravity, Urine: 1.01 (ref 1.000–1.030)
Urine Glucose: NEGATIVE
Urobilinogen, UA: 0.2 (ref 0.0–1.0)

## 2012-05-31 LAB — BASIC METABOLIC PANEL
BUN: 24 mg/dL — ABNORMAL HIGH (ref 6–23)
Chloride: 98 mEq/L (ref 96–112)
GFR: 57.84 mL/min — ABNORMAL LOW (ref >60.00)
Potassium: 4.1 mEq/L (ref 3.5–5.1)
Sodium: 133 mEq/L — ABNORMAL LOW (ref 135–145)

## 2012-05-31 LAB — HEPATIC FUNCTION PANEL
ALT: 22 U/L (ref 0–35)
Bilirubin, Direct: 0 mg/dL (ref 0.0–0.3)
Total Bilirubin: 0.7 mg/dL (ref 0.3–1.2)

## 2012-05-31 LAB — CBC WITH DIFFERENTIAL/PLATELET
Basophils Absolute: 0 10*3/uL (ref 0.0–0.1)
Eosinophils Absolute: 0.1 10*3/uL (ref 0.0–0.7)
Hemoglobin: 12.4 g/dL (ref 12.0–15.0)
Lymphocytes Relative: 39.8 % (ref 12.0–46.0)
MCHC: 33.6 g/dL (ref 30.0–36.0)
Monocytes Relative: 10.1 % (ref 3.0–12.0)
Neutrophils Relative %: 48.1 % (ref 43.0–77.0)
RDW: 14 % (ref 11.5–14.6)

## 2012-05-31 LAB — TSH: TSH: 1.04 u[IU]/mL (ref 0.35–5.50)

## 2012-06-02 ENCOUNTER — Ambulatory Visit (INDEPENDENT_AMBULATORY_CARE_PROVIDER_SITE_OTHER): Payer: 59 | Admitting: Internal Medicine

## 2012-06-02 ENCOUNTER — Encounter: Payer: Self-pay | Admitting: Internal Medicine

## 2012-06-02 VITALS — BP 120/70 | HR 80 | Temp 98.0°F | Resp 16 | Ht 64.0 in | Wt 127.0 lb

## 2012-06-02 DIAGNOSIS — M545 Low back pain, unspecified: Secondary | ICD-10-CM

## 2012-06-02 DIAGNOSIS — I10 Essential (primary) hypertension: Secondary | ICD-10-CM | POA: Diagnosis not present

## 2012-06-02 DIAGNOSIS — E785 Hyperlipidemia, unspecified: Secondary | ICD-10-CM

## 2012-06-02 DIAGNOSIS — Z0000 Encounter for general adult medical examination without abnormal findings: Secondary | ICD-10-CM | POA: Diagnosis not present

## 2012-06-02 DIAGNOSIS — R634 Abnormal weight loss: Secondary | ICD-10-CM

## 2012-06-02 MED ORDER — LOSARTAN POTASSIUM 100 MG PO TABS: 50.0000 mg | ORAL_TABLET | Freq: Every day | ORAL | Status: DC

## 2012-06-02 MED ORDER — EPINEPHRINE 0.3 MG/0.3ML IJ DEVI: INTRAMUSCULAR | Status: DC

## 2012-06-02 MED ORDER — FLUTICASONE PROPIONATE 50 MCG/ACT NA SUSP: 2.0000 | Freq: Every day | NASAL | Status: DC

## 2012-06-02 MED ORDER — ZOLPIDEM TARTRATE 10 MG PO TABS: 10.0000 mg | ORAL_TABLET | Freq: Every evening | ORAL | Status: DC | PRN

## 2012-06-02 MED ORDER — VALACYCLOVIR HCL 500 MG PO TABS: 500.0000 mg | ORAL_TABLET | Freq: Every day | ORAL | Status: DC

## 2012-06-02 MED ORDER — DIPHENOXYLATE-ATROPINE 2.5-0.025 MG PO TABS: 1.0000 | ORAL_TABLET | Freq: Two times a day (BID) | ORAL | Status: DC | PRN

## 2012-06-02 MED ORDER — ACYCLOVIR 5 % EX OINT: 1.0000 "application " | TOPICAL_OINTMENT | Freq: Every day | CUTANEOUS | Status: DC

## 2012-06-02 MED ORDER — TRAMADOL HCL 50 MG PO TABS
50.0000 mg | ORAL_TABLET | Freq: Two times a day (BID) | ORAL | Status: DC | PRN
Start: 2012-06-02 — End: 2013-01-04

## 2012-06-02 NOTE — Assessment & Plan Note (Signed)
Continue with current prescription therapy as reflected on the Med list.  

## 2012-06-02 NOTE — Progress Notes (Signed)
  Subjective:    Patient ID: Tracy Peters, female    DOB: 1933-08-31, 78 y.o.   MRN: [id number]  HPI  The patient is here for a wellness exam. The patient has been doing well overall without major physical or psychological issues going on lately.  The patient is here to follow up on chronic depression, anxiety, headaches and chronic moderate fibromyalgia symptoms controlled with medicines, diet and exercise. Her sister died 2 wks ago.  She had an allergic reactions in August 2012 x 2 hives all over, itching. No hives this summer.   Wt Readings from Last 3 Encounters:  06/02/12 127 lb (57.607 kg)  03/16/12 127 lb 11.2 oz (57.924 kg)  01/13/12 125 lb (56.7 kg)   BP Readings from Last 3 Encounters:  06/02/12 120/70  03/16/12 141/82  01/13/12 136/62      Review of Systems  Constitutional: Negative for chills, activity change, appetite change, fatigue and unexpected weight change.  HENT: Negative for congestion, mouth sores and sinus pressure.   Eyes: Negative for visual disturbance.  Respiratory: Negative for cough and chest tightness.   Gastrointestinal: Negative for nausea and abdominal pain.  Genitourinary: Negative for frequency, difficulty urinating and vaginal pain.  Musculoskeletal: Negative for back pain and gait problem.  Skin: Negative for pallor and rash.  Neurological: Negative for dizziness, tremors, weakness, numbness and headaches.  Psychiatric/Behavioral: Negative for confusion and sleep disturbance.       Objective:   Physical Exam  Constitutional: She appears well-developed and well-nourished. No distress.  HENT:  Head: Normocephalic.  Right Ear: External ear normal.  Left Ear: External ear normal.  Nose: Nose normal.  Mouth/Throat: Oropharynx is clear and moist.  Eyes: Conjunctivae normal are normal. Pupils are equal, round, and reactive to light. Right eye exhibits no discharge. Left eye exhibits no discharge.  Neck: Normal range of motion. Neck  supple. No JVD present. No tracheal deviation present. No thyromegaly present.  Cardiovascular: Normal rate, regular rhythm and normal heart sounds.   Pulmonary/Chest: No stridor. No respiratory distress. She has no wheezes.  Abdominal: Soft. Bowel sounds are normal. She exhibits no distension and no mass. There is no tenderness. There is no rebound and no guarding.  Musculoskeletal: She exhibits no edema and no tenderness.  Lymphadenopathy:    She has no cervical adenopathy.  Neurological: She displays normal reflexes. No cranial nerve deficit. She exhibits normal muscle tone. Coordination normal.  Skin: No rash noted. No erythema.  Psychiatric: She has a normal mood and affect. Her behavior is normal. Judgment and thought content normal.    Lab Results  Component Value Date   WBC 6.1 05/31/2012   HGB 12.4 05/31/2012   HCT 36.8 05/31/2012   PLT 275.0 05/31/2012   GLUCOSE 92 05/31/2012   CHOL 209* 05/31/2012   TRIG 101.0 05/31/2012   HDL 61.50 05/31/2012   LDLDIRECT 129.8 05/31/2012   LDLCALC 115* 11/19/2010   ALT 22 05/31/2012   AST 26 05/31/2012   NA 133* 05/31/2012   K 4.1 05/31/2012   CL 98 05/31/2012   CREATININE 1.0 05/31/2012   BUN 24* 05/31/2012   CO2 26 05/31/2012   TSH 1.04 05/31/2012   INR 2.50 05/25/2012         Assessment & Plan:

## 2012-06-02 NOTE — Assessment & Plan Note (Signed)
Here for medicare wellness/physical  Diet: heart healthy  Physical activity: active Depression/mood screen: negative  Hearing: intact to whispered voice  Visual acuity: grossly normal, performs annual eye exam  ADLs: capable  Fall risk: none  Home safety: good  Cognitive evaluation: intact to orientation, naming, recall and repetition  EOL planning: adv directives, full code/ I agree  I have personally reviewed and have noted  1. The patient's medical and social history  2. Their use of alcohol, tobacco or illicit drugs  3. Their current medications and supplements  4. The patient's functional ability including ADL's, fall risks, home safety risks and hearing or visual impairment.  5. Diet and physical activities  6. Evidence for depression or mood disorders    Today patient counseled on age appropriate routine health concerns for screening and prevention, each reviewed and up to date or declined. Immunizations reviewed and up to date or declined. Labs ordered and reviewed. Risk factors for depression reviewed and negative. Hearing function and visual acuity are intact. ADLs screened and addressed as needed. Functional ability and level of safety reviewed and appropriate. Education, counseling and referrals performed based on assessed risks today. Patient provided with a copy of personalized plan for preventive services.     

## 2012-06-02 NOTE — Assessment & Plan Note (Signed)
Wt Readings from Last 3 Encounters:  06/02/12 127 lb (57.607 kg)  03/16/12 127 lb 11.2 oz (57.924 kg)  01/13/12 125 lb (56.7 kg)

## 2012-06-03 ENCOUNTER — Other Ambulatory Visit: Payer: Self-pay | Admitting: Internal Medicine

## 2012-06-08 ENCOUNTER — Encounter: Payer: Self-pay | Admitting: Internal Medicine

## 2012-07-01 ENCOUNTER — Telehealth: Payer: Self-pay | Admitting: *Deleted

## 2012-07-01 NOTE — Telephone Encounter (Signed)
Rf req for Valacyclovir HCL 1 gram. 1/2 tab qd. # 45. Last filled 05/20/12. I do not see this strength on active med list. Please advise. Ok to Rf?

## 2012-07-02 MED ORDER — VALACYCLOVIR HCL 500 MG PO TABS: 500.0000 mg | ORAL_TABLET | Freq: Every day | ORAL | Status: DC

## 2012-07-02 NOTE — Telephone Encounter (Signed)
Ok Thx 

## 2012-07-15 DIAGNOSIS — A6004 Herpesviral vulvovaginitis: Secondary | ICD-10-CM | POA: Diagnosis not present

## 2012-07-15 DIAGNOSIS — N949 Unspecified condition associated with female genital organs and menstrual cycle: Secondary | ICD-10-CM | POA: Diagnosis not present

## 2012-07-20 ENCOUNTER — Ambulatory Visit (HOSPITAL_BASED_OUTPATIENT_CLINIC_OR_DEPARTMENT_OTHER): Payer: 59 | Admitting: Pharmacist

## 2012-07-20 ENCOUNTER — Other Ambulatory Visit (HOSPITAL_BASED_OUTPATIENT_CLINIC_OR_DEPARTMENT_OTHER): Payer: 59 | Admitting: Lab

## 2012-07-20 DIAGNOSIS — I82409 Acute embolism and thrombosis of unspecified deep veins of unspecified lower extremity: Secondary | ICD-10-CM | POA: Diagnosis not present

## 2012-07-20 DIAGNOSIS — Z7901 Long term (current) use of anticoagulants: Secondary | ICD-10-CM

## 2012-07-20 DIAGNOSIS — Z5181 Encounter for therapeutic drug level monitoring: Secondary | ICD-10-CM | POA: Diagnosis not present

## 2012-07-20 NOTE — Patient Instructions (Signed)
Continue 7.5mg  daily except 10mg  on Fridays.  Recheck INR in 2 months on 07/20/12 at 1030 for lab and 1045 for [facility]madin Clinic.

## 2012-07-20 NOTE — Progress Notes (Signed)
No changes to report.  INR therapeutic today at 2.0.  Continue 7.5mg  daily except 10mg  on Fridays.  Recheck INR in 2 months on 07/20/12 at 1030 for lab and 1045 for Coumadin Clinic.

## 2012-08-16 ENCOUNTER — Other Ambulatory Visit: Payer: Self-pay | Admitting: Internal Medicine

## 2012-09-20 ENCOUNTER — Other Ambulatory Visit: Payer: Self-pay | Admitting: *Deleted

## 2012-09-20 DIAGNOSIS — I742 Embolism and thrombosis of arteries of the upper extremities: Secondary | ICD-10-CM

## 2012-09-21 ENCOUNTER — Ambulatory Visit (HOSPITAL_BASED_OUTPATIENT_CLINIC_OR_DEPARTMENT_OTHER): Payer: 59 | Admitting: Pharmacist

## 2012-09-21 ENCOUNTER — Ambulatory Visit: Payer: 59 | Admitting: Lab

## 2012-09-21 DIAGNOSIS — I82409 Acute embolism and thrombosis of unspecified deep veins of unspecified lower extremity: Secondary | ICD-10-CM

## 2012-09-21 DIAGNOSIS — I82401 Acute embolism and thrombosis of unspecified deep veins of right lower extremity: Secondary | ICD-10-CM

## 2012-09-21 LAB — PROTIME-INR
INR: 1.8 — ABNORMAL LOW (ref 2.00–3.50)
Protime: 21.6 Seconds — ABNORMAL HIGH (ref 10.6–13.4)

## 2012-09-21 NOTE — Progress Notes (Signed)
  INR below goal today. Subtherapeutic INR rather unexplained (although not much in variation from last INR on 07/20/12 = 2). Pt has been very stable on current dose (7.5mg  daily except 10mg  on Fridays) since 12/2011. No missed doses. No changes in diet, medication, etc. No s/s of clotting. No other problems related to anticoagulation to report. Pt bruises easily but that is not new. Will take 10mg  today ( a one time dose) Then on 09/22/12 continue 7.5mg  daily except 10mg  on Fridays.  Recheck INR in 3 weeks on 10/12/12 at 1030 for lab and 1045 for Coumadin Clinic. Pt would prefer to wait longer to have her INR checked as she is usually checked ~ every 8 weeks. Will recheck INR in 3 weeks to insure INR returns to therapeutic range and doesn't drop further.

## 2012-09-21 NOTE — Patient Instructions (Signed)
Take 10mg  today.  Then on 09/22/12 continue 7.5mg  daily except 10mg  on Fridays.  Recheck INR in 3 weeks on 10/12/12 at 1030 for lab and 1045 for Coumadin Clinic.

## 2012-10-04 DIAGNOSIS — H52209 Unspecified astigmatism, unspecified eye: Secondary | ICD-10-CM | POA: Diagnosis not present

## 2012-10-04 DIAGNOSIS — H04129 Dry eye syndrome of unspecified lacrimal gland: Secondary | ICD-10-CM | POA: Diagnosis not present

## 2012-10-04 DIAGNOSIS — H25019 Cortical age-related cataract, unspecified eye: Secondary | ICD-10-CM | POA: Diagnosis not present

## 2012-10-04 DIAGNOSIS — H2510 Age-related nuclear cataract, unspecified eye: Secondary | ICD-10-CM | POA: Diagnosis not present

## 2012-10-12 ENCOUNTER — Ambulatory Visit (HOSPITAL_BASED_OUTPATIENT_CLINIC_OR_DEPARTMENT_OTHER): Payer: 59 | Admitting: Pharmacist

## 2012-10-12 ENCOUNTER — Other Ambulatory Visit (HOSPITAL_BASED_OUTPATIENT_CLINIC_OR_DEPARTMENT_OTHER): Payer: 59 | Admitting: Lab

## 2012-10-12 DIAGNOSIS — I82409 Acute embolism and thrombosis of unspecified deep veins of unspecified lower extremity: Secondary | ICD-10-CM

## 2012-10-12 DIAGNOSIS — I82401 Acute embolism and thrombosis of unspecified deep veins of right lower extremity: Secondary | ICD-10-CM

## 2012-10-12 LAB — PROTIME-INR: INR: 2.8 (ref 2.00–3.50)

## 2012-10-12 NOTE — Progress Notes (Signed)
INR at goal on 10mg  fridays and 7.5mg  other days.  Tracy Peters said she is feeling great with lots of energy.  She has not had any changes in medications or bleeding/bruising.  Will continue current coumadin dose and check PT/INR in 2 months.

## 2012-12-07 ENCOUNTER — Ambulatory Visit (HOSPITAL_BASED_OUTPATIENT_CLINIC_OR_DEPARTMENT_OTHER): Payer: 59 | Admitting: Pharmacist

## 2012-12-07 ENCOUNTER — Other Ambulatory Visit (HOSPITAL_BASED_OUTPATIENT_CLINIC_OR_DEPARTMENT_OTHER): Payer: 59 | Admitting: Lab

## 2012-12-07 DIAGNOSIS — I82409 Acute embolism and thrombosis of unspecified deep veins of unspecified lower extremity: Secondary | ICD-10-CM

## 2012-12-07 DIAGNOSIS — I742 Embolism and thrombosis of arteries of the upper extremities: Secondary | ICD-10-CM

## 2012-12-07 LAB — PROTIME-INR
INR: 4.2 — ABNORMAL HIGH (ref 2.00–3.50)
Protime: 50.4 Seconds — ABNORMAL HIGH (ref 10.6–13.4)

## 2012-12-07 NOTE — Patient Instructions (Signed)
Hold coumadin today (6/24).  On 12/08/12, take 3.75mg .  On 12/09/12, resume 7.5mg  daily except 10mg  on Fridays.  Recheck INR in 2 weeks; on 12/21/12 at 10:00am for lab and 10:15am for [facility]in Clinic.

## 2012-12-07 NOTE — Progress Notes (Addendum)
INR above goal today. No problems to report. No changes in diet, medications, etc. Pt may have taken an extra partial dose about 1 month ago on a Friday when she takes 10mg . She couldn't remember if she took her dose or not so she took an additional tablet. Pt is very busy and feels stressed. INR elevation may be related to her stress level. Pt has been very stable on 7.5mg  daily except 10mg  on Fridays.  Hold coumadin today (6/24).  On 12/08/12, take 3.75mg .  On 12/09/12, resume 7.5mg  daily except 10mg  on Fridays.  Recheck INR in 2 weeks; on 12/21/12 at 10:00am for lab and 10:15am for Coumadin Clinic.  Coumadin 7.5mg  sample provided: (x 1 tablet) Lot: [id number], Exp: 11/15

## 2012-12-08 ENCOUNTER — Ambulatory Visit: Payer: 59 | Admitting: Internal Medicine

## 2012-12-21 ENCOUNTER — Other Ambulatory Visit (HOSPITAL_BASED_OUTPATIENT_CLINIC_OR_DEPARTMENT_OTHER): Payer: 59 | Admitting: Lab

## 2012-12-21 ENCOUNTER — Ambulatory Visit (HOSPITAL_BASED_OUTPATIENT_CLINIC_OR_DEPARTMENT_OTHER): Payer: 59 | Admitting: Pharmacist

## 2012-12-21 DIAGNOSIS — I82409 Acute embolism and thrombosis of unspecified deep veins of unspecified lower extremity: Secondary | ICD-10-CM

## 2012-12-21 DIAGNOSIS — I82401 Acute embolism and thrombosis of unspecified deep veins of right lower extremity: Secondary | ICD-10-CM

## 2012-12-21 LAB — POCT INR: INR: 4.3

## 2012-12-21 NOTE — Progress Notes (Signed)
Pt seen in clinic today INR=4.3 Still high after holding dose and decreasing after last visit. Will hold dose today and resume 7.5 mg on Thur and daily except 3.75mg  on Wed and Fri Pt states she will not be back on boat for awhile, so hoping to get in better meals including veggies  No bleeding or bruising She will RTC on 01/05/13 10:15 Lab and 10:30 for CC.

## 2012-12-21 NOTE — Patient Instructions (Addendum)
Hold coumadin today. Start 7.5 mg daily on Thur and take 3.75mg  on Wed and Fri. Recheck INR in 2 weeks; on 01/05/13 at 10:15am for lab and 10:30am for Coumadin Clinic.

## 2013-01-04 ENCOUNTER — Encounter: Payer: Self-pay | Admitting: Internal Medicine

## 2013-01-04 ENCOUNTER — Ambulatory Visit (INDEPENDENT_AMBULATORY_CARE_PROVIDER_SITE_OTHER): Payer: 59 | Admitting: Internal Medicine

## 2013-01-04 VITALS — BP 140/70 | HR 76 | Temp 98.4°F | Resp 16 | Wt 134.0 lb

## 2013-01-04 DIAGNOSIS — I10 Essential (primary) hypertension: Secondary | ICD-10-CM | POA: Diagnosis not present

## 2013-01-04 DIAGNOSIS — R634 Abnormal weight loss: Secondary | ICD-10-CM

## 2013-01-04 DIAGNOSIS — M545 Low back pain: Secondary | ICD-10-CM

## 2013-01-04 MED ORDER — VALACYCLOVIR HCL 500 MG PO TABS: 1000.0000 mg | ORAL_TABLET | Freq: Two times a day (BID) | ORAL | Status: DC

## 2013-01-04 MED ORDER — ZOLPIDEM TARTRATE 10 MG PO TABS: 10.0000 mg | ORAL_TABLET | Freq: Every evening | ORAL | Status: DC | PRN

## 2013-01-04 MED ORDER — LOSARTAN POTASSIUM 100 MG PO TABS: 50.0000 mg | ORAL_TABLET | Freq: Every day | ORAL | Status: DC

## 2013-01-04 MED ORDER — EPINEPHRINE 0.3 MG/0.3ML IJ SOAJ: INTRAMUSCULAR | Status: DC

## 2013-01-04 MED ORDER — WARFARIN SODIUM 5 MG PO TABS: 5.0000 mg | ORAL_TABLET | Freq: Every day | ORAL | Status: DC

## 2013-01-04 MED ORDER — ACYCLOVIR 5 % EX OINT: 1.0000 "application " | TOPICAL_OINTMENT | Freq: Every day | CUTANEOUS | Status: DC

## 2013-01-04 MED ORDER — DIPHENOXYLATE-ATROPINE 2.5-0.025 MG PO TABS: 1.0000 | ORAL_TABLET | Freq: Two times a day (BID) | ORAL | Status: DC | PRN

## 2013-01-04 MED ORDER — TRAMADOL HCL 50 MG PO TABS: 50.0000 mg | ORAL_TABLET | Freq: Two times a day (BID) | ORAL | Status: DC | PRN

## 2013-01-04 NOTE — Progress Notes (Signed)
   Subjective:    HPI    The patient is here to follow up on chronic depression, anxiety, headaches and chronic moderate fibromyalgia symptoms controlled with medicines, diet and exercise. Her sister died a while ago  She had an allergic reactions in August 2012 x 2 hives all over, itching. No hives this summer.   Wt Readings from Last 3 Encounters:  01/04/13 134 lb (60.782 kg)  06/02/12 127 lb (57.607 kg)  03/16/12 127 lb 11.2 oz (57.924 kg)   BP Readings from Last 3 Encounters:  01/04/13 140/70  06/02/12 120/70  03/16/12 141/82      Review of Systems  Constitutional: Negative for chills, activity change, appetite change, fatigue and unexpected weight change.  HENT: Negative for congestion, mouth sores and sinus pressure.   Eyes: Negative for visual disturbance.  Respiratory: Negative for cough and chest tightness.   Gastrointestinal: Negative for nausea and abdominal pain.  Genitourinary: Negative for frequency, difficulty urinating and vaginal pain.  Musculoskeletal: Negative for back pain and gait problem.  Skin: Negative for pallor and rash.  Neurological: Negative for dizziness, tremors, weakness, numbness and headaches.  Psychiatric/Behavioral: Negative for confusion and sleep disturbance.       Objective:   Physical Exam  Constitutional: She appears well-developed and well-nourished. No distress.  HENT:  Head: Normocephalic.  Right Ear: External ear normal.  Left Ear: External ear normal.  Nose: Nose normal.  Mouth/Throat: Oropharynx is clear and moist.  Eyes: Conjunctivae are normal. Pupils are equal, round, and reactive to light. Right eye exhibits no discharge. Left eye exhibits no discharge.  Neck: Normal range of motion. Neck supple. No JVD present. No tracheal deviation present. No thyromegaly present.  Cardiovascular: Normal rate, regular rhythm and normal heart sounds.   Pulmonary/Chest: No stridor. No respiratory distress. She has no wheezes.   Abdominal: Soft. Bowel sounds are normal. She exhibits no distension and no mass. There is no tenderness. There is no rebound and no guarding.  Musculoskeletal: She exhibits no edema and no tenderness.  Lymphadenopathy:    She has no cervical adenopathy.  Neurological: She displays normal reflexes. No cranial nerve deficit. She exhibits normal muscle tone. Coordination normal.  Skin: No rash noted. No erythema.  Psychiatric: She has a normal mood and affect. Her behavior is normal. Judgment and thought content normal.    Lab Results  Component Value Date   WBC 6.1 05/31/2012   HGB 12.4 05/31/2012   HCT 36.8 05/31/2012   PLT 275.0 05/31/2012   GLUCOSE 92 05/31/2012   CHOL 209* 05/31/2012   TRIG 101.0 05/31/2012   HDL 61.50 05/31/2012   LDLDIRECT 129.8 05/31/2012   LDLCALC 115* 11/19/2010   ALT 22 05/31/2012   AST 26 05/31/2012   NA 133* 05/31/2012   K 4.1 05/31/2012   CL 98 05/31/2012   CREATININE 1.0 05/31/2012   BUN 24* 05/31/2012   CO2 26 05/31/2012   TSH 1.04 05/31/2012   INR 4.3 12/21/2012         Assessment & Plan:

## 2013-01-05 ENCOUNTER — Ambulatory Visit (HOSPITAL_BASED_OUTPATIENT_CLINIC_OR_DEPARTMENT_OTHER): Payer: 59 | Admitting: Pharmacist

## 2013-01-05 ENCOUNTER — Other Ambulatory Visit (HOSPITAL_BASED_OUTPATIENT_CLINIC_OR_DEPARTMENT_OTHER): Payer: 59 | Admitting: Lab

## 2013-01-05 DIAGNOSIS — I82401 Acute embolism and thrombosis of unspecified deep veins of right lower extremity: Secondary | ICD-10-CM

## 2013-01-05 DIAGNOSIS — I82409 Acute embolism and thrombosis of unspecified deep veins of unspecified lower extremity: Secondary | ICD-10-CM

## 2013-01-05 LAB — PROTIME-INR
INR: 2.2 (ref 2.00–3.50)
Protime: 26.4 Seconds — ABNORMAL HIGH (ref 10.6–13.4)

## 2013-01-05 NOTE — Progress Notes (Signed)
INR at goal today. Patient reports no bleeding or bruising or missed doses. Patient is active and feeling well with no diet changes. Patient's INR was elevated 2 weeks ago and patient's dose was adjusted for a few days. Patient states that last week she returned to taking her "regular" dose of coumadin starting on 12/26/12 (7.5 mg daily with 10 mg on Friday). With her INR at goal today we will have patient continue on this dose of 7.5 mg daily except for 10 mg on Friday. Patient will return in 4 weeks at her request due to a few trips in the coming weeks. She knows to call if she experiences any changes or bleeding. The appointment is set for 02/01/13 at 10:15am for lab and 10:30am for coumadin clinic

## 2013-01-05 NOTE — Patient Instructions (Signed)
INR at goal  You returned to taking regular dose last week   No changes  Continue regular dose of 7.5 mg daily and 10 mg on Friday  Return in 4 weeks on 02/01/13 at 1015am for lab and 10:30am for coumadin clinic

## 2013-01-09 NOTE — Assessment & Plan Note (Signed)
Continue with current prescription therapy as reflected on the Med list.  

## 2013-01-09 NOTE — Assessment & Plan Note (Signed)
Wt Readings from Last 3 Encounters:  01/04/13 134 lb (60.782 kg)  06/02/12 127 lb (57.607 kg)  03/16/12 127 lb 11.2 oz (57.924 kg)

## 2013-01-09 NOTE — Assessment & Plan Note (Signed)
No relapse 

## 2013-01-25 DIAGNOSIS — N390 Urinary tract infection, site not specified: Secondary | ICD-10-CM | POA: Diagnosis not present

## 2013-01-25 DIAGNOSIS — N9089 Other specified noninflammatory disorders of vulva and perineum: Secondary | ICD-10-CM | POA: Diagnosis not present

## 2013-02-01 ENCOUNTER — Other Ambulatory Visit (HOSPITAL_BASED_OUTPATIENT_CLINIC_OR_DEPARTMENT_OTHER): Payer: 59 | Admitting: Lab

## 2013-02-01 ENCOUNTER — Ambulatory Visit (HOSPITAL_BASED_OUTPATIENT_CLINIC_OR_DEPARTMENT_OTHER): Payer: 59 | Admitting: Pharmacist

## 2013-02-01 DIAGNOSIS — I742 Embolism and thrombosis of arteries of the upper extremities: Secondary | ICD-10-CM

## 2013-02-01 DIAGNOSIS — I82401 Acute embolism and thrombosis of unspecified deep veins of right lower extremity: Secondary | ICD-10-CM

## 2013-02-01 NOTE — Progress Notes (Signed)
Pt seen in clinic today.   INR=3.0 on coumadin 7.5 mg daily 10 mg on Fri  No changes to report.  Bumped up her valtrex up to 2g daily when she had cold sore outbreak. Will continue coumadin 7.5 mg daily 10 mg on Fri.  Recheck INR in 4 weeks on 03/01/13 at 10:30 am for lab and 10:45 am for Coumadin Clinic.

## 2013-02-01 NOTE — Patient Instructions (Addendum)
Continue coumadin 7.5 mg daily 10 mg on Fri. Recheck INR in 4 weeks on 03/01/13 at 10:30 am for lab and 10:45 am for Coumadin Clinic.

## 2013-03-01 ENCOUNTER — Other Ambulatory Visit: Payer: 59 | Admitting: Lab

## 2013-03-01 ENCOUNTER — Ambulatory Visit (HOSPITAL_BASED_OUTPATIENT_CLINIC_OR_DEPARTMENT_OTHER): Payer: 59 | Admitting: Pharmacist

## 2013-03-01 DIAGNOSIS — I742 Embolism and thrombosis of arteries of the upper extremities: Secondary | ICD-10-CM

## 2013-03-01 DIAGNOSIS — I82401 Acute embolism and thrombosis of unspecified deep veins of right lower extremity: Secondary | ICD-10-CM

## 2013-03-01 LAB — PROTIME-INR: INR: 3.1 (ref 2.00–3.50)

## 2013-03-01 LAB — POCT INR: INR: 3.1

## 2013-03-01 NOTE — Progress Notes (Signed)
INR near goal today.  No problems to report regarding anticoagulation. Usual bruising. She bruises easy.  No changes in diet or medications. No missed coumadin doses.  Will continue same dose.  Continue coumadin 7.5 mg daily 10 mg on Fri.  Recheck INR in 2 weeks on 03/15/13 at 10:30 am for lab and 10:45 am for Coumadin Clinic. Pt sees Dr. Cyndie Chime on 03/21/13.

## 2013-03-01 NOTE — Patient Instructions (Signed)
Continue coumadin 7.5 mg daily 10 mg on Fri.  Recheck INR in 2 weeks on 03/15/13 at 10:30 am for lab and 10:45 am for Coumadin Clinic.

## 2013-03-14 ENCOUNTER — Other Ambulatory Visit: Payer: 59 | Admitting: Lab

## 2013-03-15 ENCOUNTER — Other Ambulatory Visit: Payer: 59 | Admitting: Lab

## 2013-03-15 ENCOUNTER — Ambulatory Visit: Payer: 59

## 2013-03-16 ENCOUNTER — Ambulatory Visit (HOSPITAL_BASED_OUTPATIENT_CLINIC_OR_DEPARTMENT_OTHER): Payer: 59 | Admitting: Pharmacist

## 2013-03-16 ENCOUNTER — Other Ambulatory Visit (HOSPITAL_BASED_OUTPATIENT_CLINIC_OR_DEPARTMENT_OTHER): Payer: 59 | Admitting: Lab

## 2013-03-16 DIAGNOSIS — I742 Embolism and thrombosis of arteries of the upper extremities: Secondary | ICD-10-CM

## 2013-03-16 DIAGNOSIS — Z7901 Long term (current) use of anticoagulants: Secondary | ICD-10-CM | POA: Diagnosis not present

## 2013-03-16 DIAGNOSIS — I82401 Acute embolism and thrombosis of unspecified deep veins of right lower extremity: Secondary | ICD-10-CM

## 2013-03-16 LAB — CBC WITH DIFFERENTIAL/PLATELET
Eosinophils Absolute: 0.1 10*3/uL (ref 0.0–0.5)
MCV: 108.5 fL — ABNORMAL HIGH (ref 79.5–101.0)
MONO%: 7.6 % (ref 0.0–14.0)
NEUT#: 4.4 10*3/uL (ref 1.5–6.5)
RBC: 3.33 10*6/uL — ABNORMAL LOW (ref 3.70–5.45)
RDW: 14 % (ref 11.2–14.5)
WBC: 6.9 10*3/uL (ref 3.9–10.3)
lymph#: 1.8 10*3/uL (ref 0.9–3.3)

## 2013-03-16 LAB — PROTIME-INR: Protime: 44.4 Seconds — ABNORMAL HIGH (ref 10.6–13.4)

## 2013-03-16 LAB — COMPREHENSIVE METABOLIC PANEL (CC13)
ALT: 16 U/L (ref 0–55)
Alkaline Phosphatase: 50 U/L (ref 40–150)
BUN: 14.4 mg/dL (ref 7.0–26.0)
CO2: 23 mEq/L (ref 22–29)
Calcium: 9.3 mg/dL (ref 8.4–10.4)
Chloride: 104 mEq/L (ref 98–109)
Creatinine: 0.9 mg/dL (ref 0.6–1.1)
Glucose: 89 mg/dl (ref 70–140)

## 2013-03-16 LAB — POCT INR: INR: 3.7

## 2013-03-16 NOTE — Patient Instructions (Addendum)
INR trending up Hold coumadin tonight Then decrease coumadin 7.5 mg daily.  Recheck INR in 2 weeks on 04/05/13 at 10:30 am for lab and 10:45 am for Coumadin Clinic.

## 2013-03-16 NOTE — Progress Notes (Signed)
INR increasing the last few visits. Pt reports no bleeding or bruising. No missed or extra doses and no medication changes Pt has been under stress the last few weeks as she has had a sick dog and unable to sleep well at night Her husband has also been traveling for work more the past week which has added some stress Her only complaint is being fatigued Her diet has consisted more of eating out lately due to being away at the beach and with family Will adjust coumadin dose slightly due to her recent upward trend Plan: No coumadin tonight decrease coumadin 7.5 mg daily. Recheck INR in 2 weeks on 04/05/13 at 10:30 am for lab and 10:45 am for Coumadin Clinic.

## 2013-03-21 ENCOUNTER — Telehealth: Payer: Self-pay | Admitting: Oncology

## 2013-03-21 ENCOUNTER — Encounter: Payer: Self-pay | Admitting: Oncology

## 2013-03-21 ENCOUNTER — Ambulatory Visit (HOSPITAL_BASED_OUTPATIENT_CLINIC_OR_DEPARTMENT_OTHER): Payer: 59 | Admitting: Oncology

## 2013-03-21 VITALS — BP 107/74 | HR 75 | Temp 98.0°F | Resp 18 | Ht 64.0 in | Wt 135.9 lb

## 2013-03-21 DIAGNOSIS — Z86718 Personal history of other venous thrombosis and embolism: Secondary | ICD-10-CM | POA: Diagnosis not present

## 2013-03-21 DIAGNOSIS — Z7901 Long term (current) use of anticoagulants: Secondary | ICD-10-CM | POA: Diagnosis not present

## 2013-03-21 DIAGNOSIS — I742 Embolism and thrombosis of arteries of the upper extremities: Secondary | ICD-10-CM

## 2013-03-21 DIAGNOSIS — D539 Nutritional anemia, unspecified: Secondary | ICD-10-CM | POA: Diagnosis not present

## 2013-03-21 HISTORY — DX: Long term (current) use of anticoagulants: Z79.01

## 2013-03-21 MED ORDER — WARFARIN SODIUM 7.5 MG PO TABS: 7.5000 mg | ORAL_TABLET | Freq: Every day | ORAL | Status: DC

## 2013-03-21 NOTE — Telephone Encounter (Signed)
[facility] A ND PRINTED APPT SCHED AND AVS FOR PT FOR oct 2015  °

## 2013-03-22 NOTE — Progress Notes (Signed)
Hematology and Oncology Follow Up Visit  Tracy Peters [id number] Mar 30, 1934 79 y.o. 03/22/2013 7:11 PM   Principle Diagnosis: Encounter Diagnoses  Name Primary?  . Thrombosis of right radial artery Yes  . Chronic anticoagulation      Interim History:   Followup visit for this 79 year old woman followed in this office since June 1999 to monitor her anticoagulation. 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 It was not clear whether this was related to concomitant use of a COX-2 inhibitor drug. She has had no subsequent thrombotic events on long-term therapeutic Coumadin.  At time of a recent visit here in October of 2012, she had a number of complaints including unexplained weight loss, diarrhea, flushing spells, intermittent rashes, itching of her palms, intermittent abdominal pain.  Marland Kitchen She was evaluated by Dr. Lina Sar in November 2011. Symptoms felt likely secondary to chronic irritable bowel syndrome. She had a colonoscopy that was negative except for a few benign polyps. Dr. Juanda Chance also ordered a small bowel series  done 05/16/2010 and was normal. Mucosal pattern in the terminal ileum was unremarkable.  I obtained a CT scan of her abdomen and pelvis 04/07/2011 looking for signs of a carcinoid tumor. She had sigmoid diverticulosis, stable atrophy of her left kidney, no signs of any intra-abdominal tumor. Urine for VMA was normal. 5 - HIAA was borderline elevated at 7.6 mg normal less than 6. TSH was normal at 2.89 on 03/17/2011. T.-4, 5.1, lower limit of normal.  Most recent colonoscopy done 12/24/2011 which was unremarkable except for diverticulosis. Her symptoms have nearly completely resolved over the last 2 years. Her weight which fell to 125 pounds by July 2013 has steadily come up to current weight of 136 pounds today. She has had no new interim medical problems.   Medications: reviewed  Allergies:  Allergies  Allergen Reactions  . Ivp Dye  [Iodinated Diagnostic Agents] Anaphylaxis  . Amlodipine Besylate     REACTION: swelling  . Aspirin     REACTION: unspecified  . Atorvastatin     REACTION: diarrhea  . Cholestyramine     REACTION: mouth irritation  . Codeine Phosphate     REACTION: unspecified  . Demerol [Meperidine]     Severe Hallucination!!!!  . Diphenhydramine Hcl     REACTION: unspecified  . Doxycycline     REACTION: nausea  . Gentamicin Sulfate     REACTION: unspecified  . Meclizine Hcl     REACTION: more dizziness on it  . Meperidine Hcl   . Metronidazole     REACTION: bad taste  . Moxifloxacin     REACTION: insomnia  . Penicillins     REACTION: unspecified  . Sulfamethoxazole     REACTION: unspecified    Review of Systems: Constitutional:   Resolved constitutional symptoms HEENT no sore throat Respiratory: No cough or dyspnea Cardiovascular: No chest pain or palpitations  Gastrointestinal: No abdominal pain. No change in bowel habit Genito-Urinary: Not questioned Musculoskeletal: No major muscle bone or joint pain Neurologic: No headache or change in vision Skin: No rash or ecchymosis  Remaining ROS negative.     Physical Exam: Blood pressure 107/74, pulse 75, temperature 98 F (36.7 C), temperature source Oral, resp. rate 18, height 5\' 4"  (1.626 m), weight 135 lb 14.4 oz (61.644 kg). Wt Readings from Last 3 Encounters:  03/21/13 135 lb 14.4 oz (61.644 kg)  01/04/13 134 lb (60.782 kg)  06/02/12 127 lb (57.607 kg)  General appearance: Well-nourished Caucasian woman HENNT: Pharynx no erythema exudate or ulcer. No thyromegaly Lymph nodes: No cervical, supraclavicular, or axillary adenopathy Breasts: Lungs: Clear to auscultation resonant to percussion Heart: Regular rhythm no murmur or gallop Abdomen: Soft, nontender, no mass, no organomegaly Extremities: No edema, no calf tenderness Musculoskeletal: No joint deformities GU: Vascular: Radial pulses 2+ symmetric ulnar pulses 1+  symmetric;  Absent left dorsalis pedis pulse, 1+ right dorsalis pedis pulse. No carotid bruits. No cyanosis Neurologic: No focal deficit Skin: No rash or ecchymosis  Lab Results: CBC W/Diff    Component Value Date/Time   WBC 6.9 03/16/2013 1041   WBC 6.1 05/31/2012 0921   RBC 3.33* 03/16/2013 1041   RBC 3.70* 05/31/2012 0921   HGB 12.4 03/16/2013 1041   HGB 12.4 05/31/2012 0921   HCT 36.2 03/16/2013 1041   HCT 36.8 05/31/2012 0921   PLT 283 03/16/2013 1041   PLT 275.0 05/31/2012 0921   MCV 108.5* 03/16/2013 1041   MCV 99.3 05/31/2012 0921   MCH 37.1* 03/16/2013 1041   MCH 33.3 07/02/2010 1012   MCHC 34.2 03/16/2013 1041   MCHC 33.6 05/31/2012 0921   RDW 14.0 03/16/2013 1041   RDW 14.0 05/31/2012 0921   LYMPHSABS 1.8 03/16/2013 1041   LYMPHSABS 2.4 05/31/2012 0921   MONOABS 0.5 03/16/2013 1041   MONOABS 0.6 05/31/2012 0921   EOSABS 0.1 03/16/2013 1041   EOSABS 0.1 05/31/2012 0921   BASOSABS 0.0 03/16/2013 1041   BASOSABS 0.0 05/31/2012 0921     Chemistry      Component Value Date/Time   NA 137 03/16/2013 1043   NA 133* 05/31/2012 0921   K 4.5 03/16/2013 1043   K 4.1 05/31/2012 0921   CL 98 05/31/2012 0921   CL 97* 03/16/2012 1344   CO2 23 03/16/2013 1043   CO2 26 05/31/2012 0921   BUN 14.4 03/16/2013 1043   BUN 24* 05/31/2012 0921   CREATININE 0.9 03/16/2013 1043   CREATININE 1.0 05/31/2012 0921      Component Value Date/Time   CALCIUM 9.3 03/16/2013 1043   CALCIUM 9.2 05/31/2012 0921   ALKPHOS 50 03/16/2013 1043   ALKPHOS 44 05/31/2012 0921   AST 21 03/16/2013 1043   AST 26 05/31/2012 0921   ALT 16 03/16/2013 1043   ALT 22 05/31/2012 0921   BILITOT 0.37 03/16/2013 1043   BILITOT 0.7 05/31/2012 0921       Impression: #1. 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 No recurrent events on chronic Coumadin anticoagulation current dose 7.5 mg daily.  #2. Irritable bowel syndrome currently minimal symptoms  #3. Essential hypertension  #4. History of  genital herpes  #5. Mild macrocytic anemia Normal thyroid functions. I will check B12 and folic acid levels the next time she comes in for a protime/INR.       Levert Feinstein, MD 10/7/20147:11 PM

## 2013-04-05 ENCOUNTER — Other Ambulatory Visit (HOSPITAL_BASED_OUTPATIENT_CLINIC_OR_DEPARTMENT_OTHER): Payer: 59 | Admitting: Lab

## 2013-04-05 ENCOUNTER — Ambulatory Visit: Payer: 59 | Admitting: Pharmacist

## 2013-04-05 DIAGNOSIS — I82401 Acute embolism and thrombosis of unspecified deep veins of right lower extremity: Secondary | ICD-10-CM

## 2013-04-05 DIAGNOSIS — D539 Nutritional anemia, unspecified: Secondary | ICD-10-CM

## 2013-04-05 DIAGNOSIS — I82409 Acute embolism and thrombosis of unspecified deep veins of unspecified lower extremity: Secondary | ICD-10-CM

## 2013-04-05 LAB — PROTIME-INR
INR: 2.6 (ref 2.00–3.50)
Protime: 31.2 Seconds — ABNORMAL HIGH (ref 10.6–13.4)

## 2013-04-05 LAB — FOLATE: Folate: >20 ng/mL

## 2013-04-05 NOTE — Progress Notes (Signed)
INR = 2.6 She will continue Coumadin 7.5 mg daily. INR therapeutic.   Patient notes no complications of anticoagulation. She has had no medication changes, but is having symptoms of seasonal allergies and plans to begin taking loratadine as needed. She will return in 1 month on 05/03/13 at 10:30 am for lab and Coumadin Clinic. Cletis Athens, PharmD

## 2013-04-13 ENCOUNTER — Ambulatory Visit (INDEPENDENT_AMBULATORY_CARE_PROVIDER_SITE_OTHER): Payer: 59

## 2013-04-13 DIAGNOSIS — Z23 Encounter for immunization: Secondary | ICD-10-CM | POA: Diagnosis not present

## 2013-05-03 ENCOUNTER — Other Ambulatory Visit (HOSPITAL_BASED_OUTPATIENT_CLINIC_OR_DEPARTMENT_OTHER): Payer: 59 | Admitting: Lab

## 2013-05-03 ENCOUNTER — Ambulatory Visit (HOSPITAL_BASED_OUTPATIENT_CLINIC_OR_DEPARTMENT_OTHER): Payer: 59 | Admitting: Pharmacist

## 2013-05-03 DIAGNOSIS — I742 Embolism and thrombosis of arteries of the upper extremities: Secondary | ICD-10-CM

## 2013-05-03 DIAGNOSIS — I82401 Acute embolism and thrombosis of unspecified deep veins of right lower extremity: Secondary | ICD-10-CM

## 2013-05-03 LAB — PROTIME-INR
INR: 1.9 — ABNORMAL LOW (ref 2.00–3.50)
Protime: 22.8 Seconds — ABNORMAL HIGH (ref 10.6–13.4)

## 2013-05-03 LAB — POCT INR: INR: 1.9

## 2013-05-03 NOTE — Patient Instructions (Signed)
INR at goal No changes Continue Coumadin 7.5 mg daily.  Recheck INR in 6 weeks on 06/21/12 at 10:30 am for lab and 10:45 am for Coumadin Clinic.

## 2013-05-03 NOTE — Progress Notes (Signed)
INR at goal Pt is doing well with no complaints other than feeling fatigued in the afternoon Pt reports no bruising or bleeding No missed or extra doses Pt has been fairly stable on current dose. Pt would like to schedule next visit after the holidays. Plan for now: Continue Coumadin 7.5 mg daily. Recheck INR in 6 weeks on 06/21/12 at 10:30 am for lab and 10:45 am for Coumadin Clinic.

## 2013-05-04 DIAGNOSIS — M12569 Traumatic arthropathy, unspecified knee: Secondary | ICD-10-CM | POA: Diagnosis not present

## 2013-05-04 DIAGNOSIS — M25569 Pain in unspecified knee: Secondary | ICD-10-CM | POA: Diagnosis not present

## 2013-06-21 ENCOUNTER — Other Ambulatory Visit (HOSPITAL_BASED_OUTPATIENT_CLINIC_OR_DEPARTMENT_OTHER): Payer: 59

## 2013-06-21 ENCOUNTER — Ambulatory Visit: Payer: 59 | Admitting: Pharmacist

## 2013-06-21 DIAGNOSIS — I82401 Acute embolism and thrombosis of unspecified deep veins of right lower extremity: Secondary | ICD-10-CM

## 2013-06-21 DIAGNOSIS — I742 Embolism and thrombosis of arteries of the upper extremities: Secondary | ICD-10-CM

## 2013-06-21 LAB — PROTIME-INR
INR: 2.5 (ref 2.00–3.50)
Protime: 30 Seconds — ABNORMAL HIGH (ref 10.6–13.4)

## 2013-06-21 LAB — POCT INR: INR: 2.5

## 2013-06-21 NOTE — Progress Notes (Signed)
INR within goal today. No missed doses.  Pt received a steroid injection in her knee in late Nov. She has been eating slightly more greens through the holidays. No problems to report regarding anticoagulation. Continue Coumadin 7.5 mg daily.  Recheck INR in 6 weeks on 08/02/13 at 10:30 am for lab and 10:45 am for Coumadin Clinic.

## 2013-06-21 NOTE — Patient Instructions (Signed)
Continue Coumadin 7.5mg  daily.  Recheck INR in 6 weeks on 08/02/13 at 10:30 am for lab and 10:45 am for Coumadin Clinic.

## 2013-06-29 ENCOUNTER — Encounter: Payer: Self-pay | Admitting: Internal Medicine

## 2013-06-29 ENCOUNTER — Ambulatory Visit (INDEPENDENT_AMBULATORY_CARE_PROVIDER_SITE_OTHER): Payer: 59 | Admitting: Internal Medicine

## 2013-06-29 VITALS — BP 120/84 | HR 84 | Temp 97.6°F | Resp 16 | Ht 65.5 in | Wt 138.0 lb

## 2013-06-29 DIAGNOSIS — K219 Gastro-esophageal reflux disease without esophagitis: Secondary | ICD-10-CM

## 2013-06-29 DIAGNOSIS — E785 Hyperlipidemia, unspecified: Secondary | ICD-10-CM | POA: Diagnosis not present

## 2013-06-29 DIAGNOSIS — M545 Low back pain, unspecified: Secondary | ICD-10-CM | POA: Diagnosis not present

## 2013-06-29 DIAGNOSIS — R197 Diarrhea, unspecified: Secondary | ICD-10-CM | POA: Diagnosis not present

## 2013-06-29 DIAGNOSIS — Z23 Encounter for immunization: Secondary | ICD-10-CM | POA: Diagnosis not present

## 2013-06-29 DIAGNOSIS — Z0000 Encounter for general adult medical examination without abnormal findings: Secondary | ICD-10-CM | POA: Diagnosis not present

## 2013-06-29 DIAGNOSIS — I10 Essential (primary) hypertension: Secondary | ICD-10-CM

## 2013-06-29 DIAGNOSIS — R42 Dizziness and giddiness: Secondary | ICD-10-CM

## 2013-06-29 MED ORDER — ACYCLOVIR 5 % EX OINT: 1.0000 "application " | TOPICAL_OINTMENT | Freq: Every day | CUTANEOUS | Status: DC

## 2013-06-29 MED ORDER — EPINEPHRINE 0.3 MG/0.3ML IJ SOAJ: INTRAMUSCULAR | Status: DC

## 2013-06-29 MED ORDER — ZOLPIDEM TARTRATE 10 MG PO TABS: 10.0000 mg | ORAL_TABLET | Freq: Every evening | ORAL | Status: DC | PRN

## 2013-06-29 MED ORDER — WARFARIN SODIUM 5 MG PO TABS: 5.0000 mg | ORAL_TABLET | Freq: Every day | ORAL | Status: DC

## 2013-06-29 MED ORDER — TRIAMCINOLONE ACETONIDE 0.5 % EX OINT: 1.0000 "application " | TOPICAL_OINTMENT | Freq: Three times a day (TID) | CUTANEOUS | Status: DC

## 2013-06-29 MED ORDER — DIPHENOXYLATE-ATROPINE 2.5-0.025 MG PO TABS: 1.0000 | ORAL_TABLET | Freq: Two times a day (BID) | ORAL | Status: DC | PRN

## 2013-06-29 MED ORDER — TRAMADOL HCL 50 MG PO TABS: 50.0000 mg | ORAL_TABLET | Freq: Two times a day (BID) | ORAL | Status: DC | PRN

## 2013-06-29 MED ORDER — FLUTICASONE PROPIONATE 50 MCG/ACT NA SUSP: 2.0000 | Freq: Every day | NASAL | Status: DC

## 2013-06-29 MED ORDER — VALACYCLOVIR HCL 500 MG PO TABS: 1000.0000 mg | ORAL_TABLET | Freq: Two times a day (BID) | ORAL | Status: DC

## 2013-06-29 NOTE — Assessment & Plan Note (Signed)
Labs

## 2013-06-29 NOTE — Assessment & Plan Note (Signed)
Continue with current prescription therapy as reflected on the Med list.  

## 2013-06-29 NOTE — Assessment & Plan Note (Signed)
Here for medicare wellness/physical  Diet: heart healthy  Physical activity: active Depression/mood screen: negative  Hearing: intact to whispered voice  Visual acuity: grossly normal, performs annual eye exam  ADLs: capable  Fall risk: none  Home safety: good  Cognitive evaluation: intact to orientation, naming, recall and repetition  EOL planning: adv directives, full code/ I agree  I have personally reviewed and have noted  1. The patient's medical and social history  2. Their use of alcohol, tobacco or illicit drugs  3. Their current medications and supplements  4. The patient's functional ability including ADL's, fall risks, home safety risks and hearing or visual impairment.  5. Diet and physical activities  6. Evidence for depression or mood disorders    Today patient counseled on age appropriate routine health concerns for screening and prevention, each reviewed and up to date or declined. Immunizations reviewed and up to date or declined. Labs ordered and reviewed. Risk factors for depression reviewed and negative. Hearing function and visual acuity are intact. ADLs screened and addressed as needed. Functional ability and level of safety reviewed and appropriate. Education, counseling and referrals performed based on assessed risks today. Patient provided with a copy of personalized plan for preventive services.     

## 2013-06-29 NOTE — Progress Notes (Signed)
Pre visit review using our clinic review tool, if applicable. No additional management support is needed unless otherwise documented below in the visit note. 

## 2013-06-29 NOTE — Patient Instructions (Signed)
Gluten free trial (no wheat products) for 4-6 weeks. OK to use gluten-free bread and gluten-free pasta.  Milk free trial (no milk, ice cream, cheese and yogurt) for 4-6 weeks. OK to use almond, coconut, rice or soy milk. "Almond breeze" brand tastes good.  

## 2013-06-29 NOTE — Progress Notes (Signed)
   Subjective:    HPI  The patient is here for a wellness exam. The patient has been doing well overall without major physical or psychological issues going on lately.  The patient is here to follow up on chronic depression, anxiety, headaches and chronic moderate fibromyalgia symptoms controlled with medicines, diet and exercise.    Wt Readings from Last 3 Encounters:  06/29/13 138 lb (62.596 kg)  03/21/13 135 lb 14.4 oz (61.644 kg)  01/04/13 134 lb (60.782 kg)   BP Readings from Last 3 Encounters:  06/29/13 120/84  03/21/13 107/74  01/04/13 140/70      Review of Systems  Constitutional: Negative for chills, activity change, appetite change, fatigue and unexpected weight change.  HENT: Negative for congestion, mouth sores and sinus pressure.   Eyes: Negative for visual disturbance.  Respiratory: Negative for cough and chest tightness.   Gastrointestinal: Negative for nausea and abdominal pain.  Genitourinary: Negative for frequency, difficulty urinating and vaginal pain.  Musculoskeletal: Negative for back pain and gait problem.  Skin: Negative for pallor and rash.  Neurological: Negative for dizziness, tremors, weakness, numbness and headaches.  Psychiatric/Behavioral: Negative for confusion and sleep disturbance.       Objective:   Physical Exam  Constitutional: She appears well-developed and well-nourished. No distress.  HENT:  Head: Normocephalic.  Right Ear: External ear normal.  Left Ear: External ear normal.  Nose: Nose normal.  Mouth/Throat: Oropharynx is clear and moist.  Eyes: Conjunctivae are normal. Pupils are equal, round, and reactive to light. Right eye exhibits no discharge. Left eye exhibits no discharge.  Neck: Normal range of motion. Neck supple. No JVD present. No tracheal deviation present. No thyromegaly present.  Cardiovascular: Normal rate, regular rhythm and normal heart sounds.   Pulmonary/Chest: No stridor. No respiratory distress. She  has no wheezes.  Abdominal: Soft. Bowel sounds are normal. She exhibits no distension and no mass. There is no tenderness. There is no rebound and no guarding.  Musculoskeletal: She exhibits no edema and no tenderness.  Lymphadenopathy:    She has no cervical adenopathy.  Neurological: She displays normal reflexes. No cranial nerve deficit. She exhibits normal muscle tone. Coordination normal.  Skin: No rash noted. No erythema.  Psychiatric: She has a normal mood and affect. Her behavior is normal. Judgment and thought content normal.    Lab Results  Component Value Date   WBC 6.9 03/16/2013   HGB 12.4 03/16/2013   HCT 36.2 03/16/2013   PLT 283 03/16/2013   GLUCOSE 89 03/16/2013   CHOL 209* 05/31/2012   TRIG 101.0 05/31/2012   HDL 61.50 05/31/2012   LDLDIRECT 129.8 05/31/2012   LDLCALC 115* 11/19/2010   ALT 16 03/16/2013   AST 21 03/16/2013   NA 137 03/16/2013   K 4.5 03/16/2013   CL 98 05/31/2012   CREATININE 0.9 03/16/2013   BUN 14.4 03/16/2013   CO2 23 03/16/2013   TSH 1.04 05/31/2012   INR 2.50 06/21/2013         Assessment & Plan:

## 2013-06-30 NOTE — Assessment & Plan Note (Signed)
Gluten free trial (no wheat products) for 4-6 weeks. OK to use gluten-free bread and gluten-free pasta.  Milk free trial (no milk, ice cream, cheese and yogurt) for 4-6 weeks. OK to use almond, coconut, rice or soy milk. "Almond breeze" brand tastes good.  

## 2013-07-06 DIAGNOSIS — Z1231 Encounter for screening mammogram for malignant neoplasm of breast: Secondary | ICD-10-CM | POA: Diagnosis not present

## 2013-07-06 DIAGNOSIS — Z01419 Encounter for gynecological examination (general) (routine) without abnormal findings: Secondary | ICD-10-CM | POA: Diagnosis not present

## 2013-07-11 ENCOUNTER — Other Ambulatory Visit (HOSPITAL_BASED_OUTPATIENT_CLINIC_OR_DEPARTMENT_OTHER): Payer: 59

## 2013-07-11 ENCOUNTER — Telehealth: Payer: Self-pay | Admitting: Pharmacist

## 2013-07-11 ENCOUNTER — Ambulatory Visit (HOSPITAL_BASED_OUTPATIENT_CLINIC_OR_DEPARTMENT_OTHER): Payer: 59 | Admitting: Pharmacist

## 2013-07-11 DIAGNOSIS — I742 Embolism and thrombosis of arteries of the upper extremities: Secondary | ICD-10-CM

## 2013-07-11 DIAGNOSIS — Z7901 Long term (current) use of anticoagulants: Secondary | ICD-10-CM

## 2013-07-11 DIAGNOSIS — I82401 Acute embolism and thrombosis of unspecified deep veins of right lower extremity: Secondary | ICD-10-CM

## 2013-07-11 LAB — PROTIME-INR
INR: 2.5 (ref 2.00–3.50)
PROTIME: 30 s — AB (ref 10.6–13.4)

## 2013-07-11 LAB — POCT INR: INR: 2.5

## 2013-07-11 NOTE — Patient Instructions (Signed)
Continue with Coumadin 7.5 mg daily. Recheck INR in 3 weeks on 08/02/13 at 10:30 am for lab and 10:45 am for Coumadin Clinic.

## 2013-07-11 NOTE — Telephone Encounter (Signed)
Pt called pharmacy to inform us she has been taking 11.25 mg of her Coumadin now for 18 days. A prescription was e-prescribed for pt on 03/21/13 from our office for 7.5 mg tablets (take 1 tablet daily).  She picked up that RX in December (when she was almost out of her 5 mg tablets). She started using the 7.5 mg tablets on 06/21/13 just like she had been using her 5 mg tablets = 1 1/2 tablet daily.  She did not realize the strength of her tablet changed. For 18 days she has taken the wrong dose of her Coumadin. She stated she has responsibility for looking at her tablet strength incorrectly. She has had HA & dizziness We have added her to the Coumadin clinic schedule today to check her INR & CBC. Kennith Center, Pharm.D., CPP 07/11/2013@8 :31 AM

## 2013-07-11 NOTE — Progress Notes (Signed)
Pt seen in clinic today per her request She had her coumadin filled in Dec and started taking 1 1/2 tablets daily on Jan 6.  On Jan 24 she realized they were a higher strength tablet (yellow vs peach). She had been taking 11 mg daily vs her prescribed 7.5 mg tablet daily.  She did not take dose on Sat and talked with pharmacist at [facility] on [address] He instructed her to take half a 7.5 mg tablet and follow up with [name] today. INR=2.5 Deleted 7.5 mg tablet RX from her med list.  She prefers using the 5mg  to make 7.5 mg daily dose. Confirmed a peach tablet was dispensed and she has correct coumadin in her medication bottles. Continue with Coumadin 7.5 mg daily.  Recheck INR in 3 weeks as scheduled on 08/02/13 at 10:30 am for lab and 10:45 am for Coumadin Clinic.

## 2013-07-27 ENCOUNTER — Other Ambulatory Visit: Payer: Self-pay | Admitting: Internal Medicine

## 2013-08-02 ENCOUNTER — Ambulatory Visit: Payer: 59

## 2013-08-02 ENCOUNTER — Other Ambulatory Visit: Payer: 59

## 2013-08-15 ENCOUNTER — Encounter: Payer: Self-pay | Admitting: Oncology

## 2013-08-16 ENCOUNTER — Other Ambulatory Visit (HOSPITAL_BASED_OUTPATIENT_CLINIC_OR_DEPARTMENT_OTHER): Payer: 59

## 2013-08-16 ENCOUNTER — Ambulatory Visit (HOSPITAL_BASED_OUTPATIENT_CLINIC_OR_DEPARTMENT_OTHER): Payer: 59 | Admitting: Pharmacist

## 2013-08-16 DIAGNOSIS — Z7901 Long term (current) use of anticoagulants: Secondary | ICD-10-CM

## 2013-08-16 DIAGNOSIS — Z86718 Personal history of other venous thrombosis and embolism: Secondary | ICD-10-CM | POA: Diagnosis not present

## 2013-08-16 DIAGNOSIS — I82401 Acute embolism and thrombosis of unspecified deep veins of right lower extremity: Secondary | ICD-10-CM

## 2013-08-16 LAB — PROTIME-INR
INR: 1.9 — ABNORMAL LOW (ref 2.00–3.50)
Protime: 22.8 Seconds — ABNORMAL HIGH (ref 10.6–13.4)

## 2013-08-16 LAB — POCT INR: INR: 1.9

## 2013-08-16 NOTE — Patient Instructions (Signed)
Continue Coumadin 7.5 mg daily.  Recheck INR in 4 weeks on 09/13/13 at 10:30 am for lab and 10:45 am for Coumadin Clinic.

## 2013-08-16 NOTE — Progress Notes (Signed)
INR slightly below goal today. No concerns regarding anticoagulation. No missed doses. No changes in medications. Pt may have had more salads lately and more pasta (her comfort foods) during the recent snow days. No s/s of clotting noted. Will continue Coumadin 7.5 mg daily.  Recheck INR in 4 weeks on 09/13/13 at 10:30 am for lab and 10:45 am for Coumadin Clinic.

## 2013-09-13 ENCOUNTER — Ambulatory Visit (HOSPITAL_BASED_OUTPATIENT_CLINIC_OR_DEPARTMENT_OTHER): Payer: 59 | Admitting: Pharmacist

## 2013-09-13 ENCOUNTER — Other Ambulatory Visit (HOSPITAL_BASED_OUTPATIENT_CLINIC_OR_DEPARTMENT_OTHER): Payer: 59

## 2013-09-13 DIAGNOSIS — Z7901 Long term (current) use of anticoagulants: Secondary | ICD-10-CM

## 2013-09-13 DIAGNOSIS — I742 Embolism and thrombosis of arteries of the upper extremities: Secondary | ICD-10-CM

## 2013-09-13 DIAGNOSIS — I82401 Acute embolism and thrombosis of unspecified deep veins of right lower extremity: Secondary | ICD-10-CM

## 2013-09-13 LAB — PROTIME-INR
INR: 2.3 (ref 2.00–3.50)
PROTIME: 27.6 s — AB (ref 10.6–13.4)

## 2013-09-13 LAB — POCT INR: INR: 2.3

## 2013-09-13 NOTE — Progress Notes (Signed)
INR at goal of 2-3.  Ms Hammersmith only complaint is pain in knee, which is not new.  She is trying to wait until May to have another steroid injection.  No med changes.  No bleeding/bruising.  Will continue coumadin 7.5mg  daily and check PT/INR in 6 weeks.

## 2013-09-27 ENCOUNTER — Encounter: Payer: Self-pay | Admitting: Pharmacist

## 2013-09-27 ENCOUNTER — Other Ambulatory Visit: Payer: Self-pay | Admitting: Oncology

## 2013-09-27 DIAGNOSIS — I742 Embolism and thrombosis of arteries of the upper extremities: Secondary | ICD-10-CM

## 2013-09-27 DIAGNOSIS — N959 Unspecified menopausal and perimenopausal disorder: Secondary | ICD-10-CM | POA: Diagnosis not present

## 2013-09-27 DIAGNOSIS — Z13820 Encounter for screening for osteoporosis: Secondary | ICD-10-CM | POA: Diagnosis not present

## 2013-09-27 DIAGNOSIS — M949 Disorder of cartilage, unspecified: Secondary | ICD-10-CM | POA: Diagnosis not present

## 2013-09-27 DIAGNOSIS — Z7901 Long term (current) use of anticoagulants: Secondary | ICD-10-CM

## 2013-09-27 DIAGNOSIS — M899 Disorder of bone, unspecified: Secondary | ICD-10-CM | POA: Diagnosis not present

## 2013-09-27 NOTE — Progress Notes (Signed)
Pt called Tracy Peters today reporting R hand numbness & "tingling".  This has been going on for "several months but is getting worse." She explains that this effects her R hand and if her hand is above her waist or higher, her sxs appear but when she holds her hand down by her side, the blood flow to her fingertips improves and her hand no longer is numb or tingling. She keeps a sock on her R hand at night to keep it warm & she sleeps with her hand by her side or lets it fall down by her mattress.  She had to keep a heating pad on her hand recently in an attempt to improve blood flow. Pt is concerned that these were similar sxs from when she was dx w/ radial artery in 1998.  She was then seeing Dr. Daylene Katayama (?sp.); a hand surgeon in [place]. I s/w Dr. Beryle Beams & he will see her tomorrow (09/28/13) at the [facility] IM clinic. Last INR check = 2.3 on 09/13/13.   Kennith Center, Pharm.D., CPP 09/27/2013@1 :45 PM

## 2013-09-28 ENCOUNTER — Ambulatory Visit (INDEPENDENT_AMBULATORY_CARE_PROVIDER_SITE_OTHER): Payer: 59 | Admitting: Oncology

## 2013-09-28 ENCOUNTER — Other Ambulatory Visit (INDEPENDENT_AMBULATORY_CARE_PROVIDER_SITE_OTHER): Payer: 59

## 2013-09-28 ENCOUNTER — Encounter: Payer: Self-pay | Admitting: Oncology

## 2013-09-28 VITALS — BP 161/84 | HR 77 | Temp 97.5°F | Ht 65.5 in | Wt 142.0 lb

## 2013-09-28 DIAGNOSIS — Z7901 Long term (current) use of anticoagulants: Secondary | ICD-10-CM

## 2013-09-28 DIAGNOSIS — R209 Unspecified disturbances of skin sensation: Secondary | ICD-10-CM | POA: Diagnosis not present

## 2013-09-28 DIAGNOSIS — I10 Essential (primary) hypertension: Secondary | ICD-10-CM

## 2013-09-28 DIAGNOSIS — I742 Embolism and thrombosis of arteries of the upper extremities: Secondary | ICD-10-CM

## 2013-09-28 DIAGNOSIS — I749 Embolism and thrombosis of unspecified artery: Secondary | ICD-10-CM | POA: Diagnosis not present

## 2013-09-28 LAB — CBC WITH DIFFERENTIAL/PLATELET
BASOS PCT: 0 % (ref 0–1)
Basophils Absolute: 0 10*3/uL (ref 0.0–0.1)
Eosinophils Absolute: 0.1 10*3/uL (ref 0.0–0.7)
Eosinophils Relative: 1 % (ref 0–5)
HEMATOCRIT: 34.9 % — AB (ref 36.0–46.0)
Hemoglobin: 12.2 g/dL (ref 12.0–15.0)
LYMPHS PCT: 30 % (ref 12–46)
Lymphs Abs: 2.3 10*3/uL (ref 0.7–4.0)
MCH: 37.9 pg — ABNORMAL HIGH (ref 26.0–34.0)
MCHC: 35 g/dL (ref 30.0–36.0)
MCV: 108.4 fL — ABNORMAL HIGH (ref 78.0–100.0)
MONO ABS: 0.5 10*3/uL (ref 0.1–1.0)
Monocytes Relative: 7 % (ref 3–12)
NEUTROS PCT: 62 % (ref 43–77)
Neutro Abs: 4.7 10*3/uL (ref 1.7–7.7)
PLATELETS: 293 10*3/uL (ref 150–400)
RBC: 3.22 MIL/uL — ABNORMAL LOW (ref 3.87–5.11)
RDW: 12.5 % (ref 11.5–15.5)
WBC: 7.6 10*3/uL (ref 4.0–10.5)

## 2013-09-28 LAB — PROTIME-INR
INR: 1.99 — ABNORMAL HIGH
Prothrombin Time: 22 s — ABNORMAL HIGH (ref 11.6–15.2)

## 2013-09-28 NOTE — Progress Notes (Signed)
Patient ID: Tracy Peters, female   DOB: Oct 04, 1933, 80 y.o.   MRN: [id number] Hematology and Oncology Follow Up Visit  Tracy Peters [id number] 1934/03/06 80 y.o. 09/28/2013 2:38 PM   Principle Diagnosis: Encounter Diagnoses  Name Primary?  Marland Kitchen PARESTHESIA Yes  . Thrombosis of right radial artery   . Chronic anticoagulation      Interim History:   Unscheduled visit for this pleasant 80 year old woman I have followed for many years. She sustained an arterial embolus to an index finger of her right hand involving a right radial artery in 1999 which happened coincidentally with starting a Cox 2 inhibitor. She has been maintained on chronic Coumadin anticoagulation since that time and has had no subsequent thrombotic events. She has known degenerative arthritis of the spine and has had 2 operations in the past. She tells me that her left knee is deteriorating and surgery also recommended on this in the near future. She has noted low-grade neck pain and intermittent mild paresthesias of her right hand as far back as January of this year. Over the last 3 weeks she has had almost constant and progressive paresthesias in her right hand. No obvious ischemic changes unless her arm dangles for a long time and then she notes a mild bluish discoloration of the fingertips which resolves when she elevates the arm.  She does not use a computer keyboard on a regular basis.  Medications: reviewed  Allergies:  Allergies  Allergen Reactions  . Ivp Dye [Iodinated Diagnostic Agents] Anaphylaxis  . Amlodipine Besylate     REACTION: swelling  . Aspirin     REACTION: unspecified  . Atorvastatin     REACTION: diarrhea  . Cholestyramine     REACTION: mouth irritation  . Codeine Phosphate     REACTION: unspecified  . Demerol [Meperidine]     Severe Hallucination!!!!  . Diphenhydramine Hcl     REACTION: unspecified  . Doxycycline     REACTION: nausea  . Gentamicin Sulfate     REACTION: unspecified  .  Meclizine Hcl     REACTION: more dizziness on it  . Meperidine Hcl   . Metronidazole     REACTION: bad taste  . Moxifloxacin     REACTION: insomnia  . Penicillins     REACTION: unspecified  . Sulfamethoxazole     REACTION: unspecified    Review of Systems: Hematology:  No bleeding or bruising ENT ROS: No sore throat Breast ROS:  Respiratory ROS: No cough or dyspnea Cardiovascular ROS:  No chest pain or palpitations Gastrointestinal ROS:   No abdominal pain Genito-Urinary ROS: Not questioned Musculoskeletal ROS: See above Neurological ROS: See above  Dermatological ROS:  Remaining ROS negative:   Physical Exam: Blood pressure 161/84, pulse 77, temperature 97.5 F (36.4 C), temperature source Oral, height 5' 5.5" (1.664 m), weight 142 lb (64.411 kg), SpO2 99.00%. Wt Readings from Last 3 Encounters:  09/28/13 142 lb (64.411 kg)  06/29/13 138 lb (62.596 kg)  03/21/13 135 lb 14.4 oz (61.644 kg)     General appearance: Well-nourished Caucasian woman appears younger than her stated age HENNT: Pharynx no erythema, exudate, mass, or ulcer. No thyromegaly or thyroid nodules Lymph nodes: No cervical, supraclavicular, or axillary lymphadenopathy Breasts: Lungs: Clear to auscultation, resonant to percussion throughout Heart: Regular rhythm, no murmur, no gallop, no rub, no click, no edema Abdomen: Soft, nontender, normal bowel sounds, no mass, no organomegaly Extremities: No edema, no calf tenderness Musculoskeletal: no joint deformities. No thickening of  the carpal tendons. GU:  Vascular: Carotid pulses 2+, no bruits, radial pulses 2+ symmetric, ulnar pulses 1+ symmetric, right brachial pulse 2+ symmetric, distal pulses: Not evaluated in the lower extremities . No ischemic changes of the digits. Neurologic: Alert, oriented, PERRLA, optic discs sharp and vessels normal, no hemorrhage or exudate, cranial nerves grossly normal, motor strength 5 over 5, except for mildly decreased hand  grip strength right hand reflexes 1+ symmetric, upper body coordination normal, gait normal, sensation is decreased to pinprick in a glove distribution over her right hand both medially and laterally extending to the elbow. Skin: No rash or ecchymosis  Lab Results: CBC W/Diff    Component Value Date/Time   WBC 7.6 09/28/2013 1320   WBC 6.9 03/16/2013 1041   RBC 3.22* 09/28/2013 1320   RBC 3.33* 03/16/2013 1041   HGB 12.2 09/28/2013 1320   HGB 12.4 03/16/2013 1041   HCT 34.9* 09/28/2013 1320   HCT 36.2 03/16/2013 1041   PLT 293 09/28/2013 1320   PLT 283 03/16/2013 1041   MCV 108.4* 09/28/2013 1320   MCV 108.5* 03/16/2013 1041   MCH 37.9* 09/28/2013 1320   MCH 37.1* 03/16/2013 1041   MCHC 35.0 09/28/2013 1320   MCHC 34.2 03/16/2013 1041   RDW 12.5 09/28/2013 1320   RDW 14.0 03/16/2013 1041   LYMPHSABS 2.3 09/28/2013 1320   LYMPHSABS 1.8 03/16/2013 1041   MONOABS 0.5 09/28/2013 1320   MONOABS 0.5 03/16/2013 1041   EOSABS 0.1 09/28/2013 1320   EOSABS 0.1 03/16/2013 1041   BASOSABS 0.0 09/28/2013 1320   BASOSABS 0.0 03/16/2013 1041     Chemistry      Component Value Date/Time   NA 137 03/16/2013 1043   NA 133* 05/31/2012 0921   K 4.5 03/16/2013 1043   K 4.1 05/31/2012 0921   CL 98 05/31/2012 0921   CL 97* 03/16/2012 1344   CO2 23 03/16/2013 1043   CO2 26 05/31/2012 0921   BUN 14.4 03/16/2013 1043   BUN 24* 05/31/2012 0921   CREATININE 0.9 03/16/2013 1043   CREATININE 1.0 05/31/2012 0921      Component Value Date/Time   CALCIUM 9.3 03/16/2013 1043   CALCIUM 9.2 05/31/2012 0921   ALKPHOS 50 03/16/2013 1043   ALKPHOS 44 05/31/2012 0921   AST 21 03/16/2013 1043   AST 26 05/31/2012 0921   ALT 16 03/16/2013 1043   ALT 22 05/31/2012 0921   BILITOT 0.37 03/16/2013 1043   BILITOT 0.7 05/31/2012 0921    INR 2.3 on 09/13/2013. Repeat today 2.0. Hemoglobin 12.2 MCV 108 platelet count 293,000   Radiological Studies: No results found.  Impression:  #1. Progressive paresthesias and trace weakness right  hand. Normal vascular exam. Given her history of degenerative arthritis of the spine my guess is that we are looking at cervical disc disease although this usually presents as either radial or ulnar sensory deficits and her deficits are bilateral. I think she needs an MRI of the spine and a neurosurgery referral. Dr. Newman Pies has operated on her daughter twice and she is very pleased with his work and I will make a referral to him. I will go ahead and order an MRI so this is available at time of his evaluation. I told her to get a soft cervical collar and wear this until she sees Dr. Arnoldo Morale.  #2. Idiopathic arterial thrombosis not associated with antiphospholipid antibody syndrome on chronic Coumadin anticoagulation. She remains therapeutic and vascular exam today is normal so I  do not feel we're dealing with recurrent thrombosis.  #3. Chronic macrocytosis with stable hemoglobin of 12 g. Normal Q00 and folic acid levels done in October 2014.  #4. Known degenerative arthritis of the spine  #5. Essential hypertension  #6. Irritable bowel syndrome  #7. History of genital herpes.   CC: Patient Care Team: Cassandria Anger, MD as PCP - General Marin Shutter, MD (Orthopedic Surgery) Annia Belt, MD (Hematology and Oncology) Margarette Asal, MD (Obstetrics and Gynecology) Lafayette Dragon, MD (Gastroenterology)   Annia Belt, MD 4/15/20152:38 PM

## 2013-09-28 NOTE — Patient Instructions (Signed)
Schedule MRI cervical spine @ [facility] 1st available. Referral to Neurosurgery, Dr Newman Pies made Follow up with Dr Beryle Beams 2-3 months 15 minutes no lab

## 2013-10-12 ENCOUNTER — Ambulatory Visit (HOSPITAL_COMMUNITY)
Admission: RE | Admit: 2013-10-12 | Discharge: 2013-10-12 | Disposition: A | Discharge status: Home / Self Care | Payer: 59 | Source: Ambulatory Visit | Attending: Oncology | Admitting: Oncology

## 2013-10-12 DIAGNOSIS — M4802 Spinal stenosis, cervical region: Secondary | ICD-10-CM | POA: Diagnosis not present

## 2013-10-12 DIAGNOSIS — M542 Cervicalgia: Secondary | ICD-10-CM | POA: Diagnosis not present

## 2013-10-12 DIAGNOSIS — R209 Unspecified disturbances of skin sensation: Secondary | ICD-10-CM | POA: Diagnosis not present

## 2013-10-12 DIAGNOSIS — M47812 Spondylosis without myelopathy or radiculopathy, cervical region: Secondary | ICD-10-CM | POA: Diagnosis not present

## 2013-10-12 DIAGNOSIS — M79609 Pain in unspecified limb: Secondary | ICD-10-CM | POA: Diagnosis not present

## 2013-10-12 DIAGNOSIS — M5030 Other cervical disc degeneration, unspecified cervical region: Secondary | ICD-10-CM | POA: Diagnosis not present

## 2013-10-13 ENCOUNTER — Telehealth: Payer: Self-pay | Admitting: *Deleted

## 2013-10-13 NOTE — Telephone Encounter (Signed)
Pt states Dr Beryle Beams had called her yesterday about her MRI resutls. States she has an appt w/Dr Heinz Knuckles, on May 12th; possible sooner if there's any cancellations and after seeing the MRI.

## 2013-10-19 DIAGNOSIS — M25569 Pain in unspecified knee: Secondary | ICD-10-CM | POA: Diagnosis not present

## 2013-10-19 DIAGNOSIS — M175 Other unilateral secondary osteoarthritis of knee: Secondary | ICD-10-CM | POA: Diagnosis not present

## 2013-10-25 ENCOUNTER — Ambulatory Visit: Payer: 59

## 2013-10-25 ENCOUNTER — Other Ambulatory Visit: Payer: 59

## 2013-10-25 DIAGNOSIS — M542 Cervicalgia: Secondary | ICD-10-CM | POA: Diagnosis not present

## 2013-10-25 DIAGNOSIS — M47812 Spondylosis without myelopathy or radiculopathy, cervical region: Secondary | ICD-10-CM | POA: Diagnosis not present

## 2013-10-25 DIAGNOSIS — M4802 Spinal stenosis, cervical region: Secondary | ICD-10-CM | POA: Insufficient documentation

## 2013-10-25 DIAGNOSIS — M5412 Radiculopathy, cervical region: Secondary | ICD-10-CM | POA: Diagnosis not present

## 2013-10-25 DIAGNOSIS — M4722 Other spondylosis with radiculopathy, cervical region: Secondary | ICD-10-CM | POA: Diagnosis present

## 2013-10-31 ENCOUNTER — Other Ambulatory Visit: Payer: Self-pay | Admitting: Pharmacist

## 2013-10-31 DIAGNOSIS — Z7901 Long term (current) use of anticoagulants: Secondary | ICD-10-CM

## 2013-11-01 ENCOUNTER — Other Ambulatory Visit (HOSPITAL_BASED_OUTPATIENT_CLINIC_OR_DEPARTMENT_OTHER): Payer: 59

## 2013-11-01 ENCOUNTER — Ambulatory Visit (HOSPITAL_BASED_OUTPATIENT_CLINIC_OR_DEPARTMENT_OTHER): Payer: 59 | Admitting: Pharmacist

## 2013-11-01 DIAGNOSIS — I749 Embolism and thrombosis of unspecified artery: Secondary | ICD-10-CM | POA: Diagnosis not present

## 2013-11-01 DIAGNOSIS — Z7901 Long term (current) use of anticoagulants: Secondary | ICD-10-CM

## 2013-11-01 LAB — PROTIME-INR
INR: 2.4 (ref 2.00–3.50)
Protime: 28.8 Seconds — ABNORMAL HIGH (ref 10.6–13.4)

## 2013-11-01 LAB — POCT INR: INR: 2.4

## 2013-11-01 NOTE — Patient Instructions (Signed)
INR at goal No changes Continue with Coumadin 7.5 mg daily.  Recheck INR in ~6 weeks on 12/27/13 at 10:30 am for lab and 10:45 am for Coumadin Clinic.

## 2013-11-01 NOTE — Progress Notes (Signed)
INR at goal Pt is doing well She reports no unusual bleeding or bruising No missed or extra doses No medication or diet changes Pt recently saw Dr. Beryle Beams due to some symptoms in her hands.  The issue is with her vertebrae and she has seen a neurosurgeon after results of an MRI.  Plan is surgery, but pt wants to wait until after summer Pt also has plans to have a knee replacement procedure sometime in the fall/winter We will coordinate anticoagulation plans with Dr. Beryle Beams and the surgeons around the timing of those procedures Plan: No changes Continue with Coumadin 7.5 mg daily.  Recheck INR in ~6 weeks on 12/27/13 at 10:30 am for lab and 10:45 am for Coumadin Clinic.

## 2013-11-29 ENCOUNTER — Other Ambulatory Visit: Payer: Self-pay | Admitting: Neurosurgery

## 2013-12-12 ENCOUNTER — Other Ambulatory Visit: Payer: Self-pay | Admitting: Oncology

## 2013-12-12 ENCOUNTER — Telehealth: Payer: Self-pay | Admitting: Oncology

## 2013-12-12 DIAGNOSIS — I742 Embolism and thrombosis of arteries of the upper extremities: Secondary | ICD-10-CM

## 2013-12-12 NOTE — Telephone Encounter (Signed)
lmonvm advising the pt of her lab appt in aug

## 2013-12-13 ENCOUNTER — Encounter: Payer: Self-pay | Admitting: Pharmacist

## 2013-12-13 NOTE — Progress Notes (Unsigned)
Anticoagulation Plan Requested by Dr. Beryle Beams is below:  She is scheduled to have surgery on her cervical spine on Wednesday, August 5.  Surgeon would like her to stop Coumadin 7 days in advance.  Please advise the patient to stop her Coumadin on July 29.  Begin Lovenox 1.5 mg per kilogram subcutaneous, Friday July 31.  Resume Lovenox 48 hours postop on August 7.  Resume Coumadin on Thursday, August 6.  Check PT/INR on Monday, August 10.  If therapeutic, discontinue Lovenox at that time.  Pt is coming for INR check on 12/27/13.  We will discuss anticoagulation plan, call in Lovenox rx if needed, and set up f/u appointment for 01/23/14 on that visit.

## 2013-12-26 ENCOUNTER — Other Ambulatory Visit: Payer: 59

## 2013-12-27 ENCOUNTER — Other Ambulatory Visit (HOSPITAL_BASED_OUTPATIENT_CLINIC_OR_DEPARTMENT_OTHER): Payer: 59

## 2013-12-27 ENCOUNTER — Ambulatory Visit (HOSPITAL_BASED_OUTPATIENT_CLINIC_OR_DEPARTMENT_OTHER): Payer: 59 | Admitting: Pharmacist

## 2013-12-27 DIAGNOSIS — Z7901 Long term (current) use of anticoagulants: Secondary | ICD-10-CM

## 2013-12-27 DIAGNOSIS — I749 Embolism and thrombosis of unspecified artery: Secondary | ICD-10-CM

## 2013-12-27 LAB — PROTIME-INR
INR: 2.5 (ref 2.00–3.50)
PROTIME: 30 s — AB (ref 10.6–13.4)

## 2013-12-27 LAB — POCT INR: INR: 2.5

## 2013-12-27 NOTE — Progress Notes (Signed)
INR = 2.5     Goal 2-3 INR within goal range and stable. Patient has had no bleeding, she had some bruising on 7/4 after tripping over a puppy on her dock. She has had no medication changes. She is scheduled for spinal surgery on 8/5. Instructions for anticoagulation around surgery were received and reviewed with the patient today. She will stop Coumadin on 7/29. She will begin Lovenox 100 mg daily (1.5 mg/kg q 24h) on 7/31. She will resume Coumadin at current dose on 8/6. She will resume Lovenox on 8/7. Her INR will be checked on 01/23/14 with lab at 1:00pm and Coumadin Clinic at 1:15. If INR is therapeutic she will discontinue Lovenox at that time. Lovenox 100 mg x 20 syringes given to patient, we have given her Lovenox for previous procedures. She will call us if she has questions regarding any of the above.  Theone Murdoch, PharmD

## 2013-12-28 ENCOUNTER — Encounter: Payer: Self-pay | Admitting: Internal Medicine

## 2013-12-28 ENCOUNTER — Ambulatory Visit (INDEPENDENT_AMBULATORY_CARE_PROVIDER_SITE_OTHER): Payer: 59 | Admitting: Internal Medicine

## 2013-12-28 ENCOUNTER — Other Ambulatory Visit: Payer: Self-pay | Admitting: Internal Medicine

## 2013-12-28 VITALS — BP 160/84 | HR 80 | Temp 98.4°F | Resp 16 | Wt 139.0 lb

## 2013-12-28 DIAGNOSIS — L03011 Cellulitis of right finger: Secondary | ICD-10-CM

## 2013-12-28 DIAGNOSIS — IMO0002 Reserved for concepts with insufficient information to code with codable children: Secondary | ICD-10-CM | POA: Insufficient documentation

## 2013-12-28 DIAGNOSIS — M79672 Pain in left foot: Secondary | ICD-10-CM | POA: Insufficient documentation

## 2013-12-28 DIAGNOSIS — I742 Embolism and thrombosis of arteries of the upper extremities: Secondary | ICD-10-CM

## 2013-12-28 DIAGNOSIS — M25569 Pain in unspecified knee: Secondary | ICD-10-CM | POA: Diagnosis not present

## 2013-12-28 DIAGNOSIS — M25562 Pain in left knee: Secondary | ICD-10-CM | POA: Insufficient documentation

## 2013-12-28 DIAGNOSIS — M5412 Radiculopathy, cervical region: Secondary | ICD-10-CM

## 2013-12-28 MED ORDER — VALACYCLOVIR HCL 500 MG PO TABS: 1000.0000 mg | ORAL_TABLET | Freq: Two times a day (BID) | ORAL | Status: DC

## 2013-12-28 MED ORDER — EPINEPHRINE 0.3 MG/0.3ML IJ SOAJ: INTRAMUSCULAR | Status: DC

## 2013-12-28 MED ORDER — CIPROFLOXACIN HCL 250 MG PO TABS: 250.0000 mg | ORAL_TABLET | Freq: Two times a day (BID) | ORAL | Status: DC

## 2013-12-28 MED ORDER — TRAMADOL HCL 50 MG PO TABS: 50.0000 mg | ORAL_TABLET | Freq: Two times a day (BID) | ORAL | Status: DC | PRN

## 2013-12-28 MED ORDER — ZOLPIDEM TARTRATE 10 MG PO TABS: 10.0000 mg | ORAL_TABLET | Freq: Every evening | ORAL | Status: DC | PRN

## 2013-12-28 MED ORDER — ACYCLOVIR 5 % EX OINT: 1.0000 "application " | TOPICAL_OINTMENT | Freq: Every day | CUTANEOUS | Status: DC

## 2013-12-28 MED ORDER — FLUTICASONE PROPIONATE 50 MCG/ACT NA SUSP: NASAL | Status: DC

## 2013-12-28 MED ORDER — WARFARIN SODIUM 5 MG PO TABS: 5.0000 mg | ORAL_TABLET | Freq: Every day | ORAL | Status: DC

## 2013-12-28 NOTE — Progress Notes (Signed)
Pre visit review using our clinic review tool, if applicable. No additional management support is needed unless otherwise documented below in the visit note. 

## 2013-12-28 NOTE — Progress Notes (Signed)
   Subjective:    HPI  C/o neck pain: Dr Orinda Kenner will operate on 01/18/14  C/o L knee pain - Dr Wynelle Link will need to operate on it later  C/o R 5th toe selling and pain x5 wks  The patient is here to follow up on chronic depression, anxiety, headaches and chronic moderate fibromyalgia symptoms controlled with medicines, diet and exercise. Her sister died a while ago  She had an allergic reactions in August 2012 x 2 hives all over, itching. No hives this summer.   Wt Readings from Last 3 Encounters:  12/28/13 139 lb (63.05 kg)  09/28/13 142 lb (64.411 kg)  06/29/13 138 lb (62.596 kg)   BP Readings from Last 3 Encounters:  12/28/13 160/84  09/28/13 161/84  06/29/13 120/84      Review of Systems  Constitutional: Negative for chills, activity change, appetite change, fatigue and unexpected weight change.  HENT: Negative for congestion, mouth sores and sinus pressure.   Eyes: Negative for visual disturbance.  Respiratory: Negative for cough and chest tightness.   Gastrointestinal: Negative for nausea and abdominal pain.  Genitourinary: Negative for frequency, difficulty urinating and vaginal pain.  Musculoskeletal: Negative for back pain and gait problem.  Skin: Negative for pallor and rash.  Neurological: Negative for dizziness, tremors, weakness, numbness and headaches.  Psychiatric/Behavioral: Negative for confusion and sleep disturbance.       Objective:   Physical Exam  Constitutional: She appears well-developed and well-nourished. No distress.  HENT:  Head: Normocephalic.  Right Ear: External ear normal.  Left Ear: External ear normal.  Nose: Nose normal.  Mouth/Throat: Oropharynx is clear and moist.  Eyes: Conjunctivae are normal. Pupils are equal, round, and reactive to light. Right eye exhibits no discharge. Left eye exhibits no discharge.  Neck: Normal range of motion. Neck supple. No JVD present. No tracheal deviation present. No thyromegaly present.   Cardiovascular: Normal rate, regular rhythm and normal heart sounds.   Pulmonary/Chest: No stridor. No respiratory distress. She has no wheezes.  Abdominal: Soft. Bowel sounds are normal. She exhibits no distension and no mass. There is no tenderness. There is no rebound and no guarding.  Musculoskeletal: She exhibits no edema and no tenderness.  Lymphadenopathy:    She has no cervical adenopathy.  Neurological: She displays normal reflexes. No cranial nerve deficit. She exhibits normal muscle tone. Coordination normal.  Skin: No rash noted. No erythema.  Psychiatric: She has a normal mood and affect. Her behavior is normal. Judgment and thought content normal.  R 5th toe is swollen, red - small nail  Lab Results  Component Value Date   WBC 7.6 09/28/2013   HGB 12.2 09/28/2013   HCT 34.9* 09/28/2013   PLT 293 09/28/2013   GLUCOSE 89 03/16/2013   CHOL 209* 05/31/2012   TRIG 101.0 05/31/2012   HDL 61.50 05/31/2012   LDLDIRECT 129.8 05/31/2012   LDLCALC 115* 11/19/2010   ALT 16 03/16/2013   AST 21 03/16/2013   NA 137 03/16/2013   K 4.5 03/16/2013   CL 98 05/31/2012   CREATININE 0.9 03/16/2013   BUN 14.4 03/16/2013   CO2 23 03/16/2013   TSH 1.04 05/31/2012   INR 2.50 12/27/2013         Assessment & Plan:

## 2013-12-28 NOTE — Assessment & Plan Note (Signed)
R>>L Dr Orinda Kenner will operate on 01/18/14

## 2013-12-28 NOTE — Patient Instructions (Signed)
Your toe:  Soak  Podiatry consult if not better We can try Cipro - able to tolerate it in the past -- IF WORSE.Marland KitchenMarland Kitchen

## 2013-12-28 NOTE — Telephone Encounter (Signed)
It was 2 tabs 500mg  twice a day (1000 mg bid): I haven't changed it. Thx

## 2013-12-28 NOTE — Assessment & Plan Note (Signed)
7/15 Dr Wynelle Link Will need surgery

## 2013-12-28 NOTE — Assessment & Plan Note (Addendum)
Soak  Podiatry consult if not better We can try Cipro - able to tolerate it in the past -- IF WORSE.Marland KitchenMarland Kitchen

## 2013-12-28 NOTE — Telephone Encounter (Signed)
Pt was here for an appt today but she a got a written Rx for Valtrex 500 mg (this is wrong). Pt stated stated that this med need to be 1000 mg and she take twice a day. Please check this and send to [facility].

## 2013-12-29 DIAGNOSIS — M175 Other unilateral secondary osteoarthritis of knee: Secondary | ICD-10-CM | POA: Diagnosis not present

## 2013-12-29 MED ORDER — VALACYCLOVIR HCL 500 MG PO TABS: 1000.0000 mg | ORAL_TABLET | Freq: Two times a day (BID) | ORAL | Status: DC

## 2014-01-05 ENCOUNTER — Encounter (HOSPITAL_COMMUNITY): Payer: Self-pay

## 2014-01-09 ENCOUNTER — Ambulatory Visit (INDEPENDENT_AMBULATORY_CARE_PROVIDER_SITE_OTHER): Payer: 59 | Admitting: Podiatry

## 2014-01-09 ENCOUNTER — Encounter: Payer: Self-pay | Admitting: Podiatry

## 2014-01-09 VITALS — BP 141/83 | HR 70 | Resp 16 | Ht 64.0 in | Wt 132.0 lb

## 2014-01-09 DIAGNOSIS — M779 Enthesopathy, unspecified: Secondary | ICD-10-CM

## 2014-01-09 DIAGNOSIS — I742 Embolism and thrombosis of arteries of the upper extremities: Secondary | ICD-10-CM

## 2014-01-09 DIAGNOSIS — L600 Ingrowing nail: Secondary | ICD-10-CM | POA: Diagnosis not present

## 2014-01-09 MED ORDER — TRIAMCINOLONE ACETONIDE 10 MG/ML IJ SUSP
10.0000 mg | Freq: Once | INTRAMUSCULAR | Status: AC
  Administered 2014-01-09: 10 mg

## 2014-01-09 NOTE — Progress Notes (Signed)
   Subjective:    Patient ID: Tracy Peters, female    DOB: 12/22/1933, 80 y.o.   MRN: [id number]  HPI Comments: "I have a problem with this little toe"  Patient c/o burning, stinging 5th toe right, medial side, for about 6 weeks. She noticed after her pedicure that there was a blistered area and it was also red and swollen at the toenail-medial side. She hasn't been able to wear enclosed shoes. The redness is better, but its still "so sore" Her PCP said she may have an infection and Dr. Maureen Ralphs thinks maybe the toenail needs to be removed.  NOTE:  Patient is on coumadin and is scheduled to have neck surgery soon.   Toe Pain  Associated symptoms include numbness.      Review of Systems  Musculoskeletal: Positive for arthralgias.  Neurological: Positive for numbness.  Hematological: Bruises/bleeds easily.  All other systems reviewed and are negative.      Objective:   Physical Exam        Assessment & Plan:

## 2014-01-09 NOTE — Progress Notes (Signed)
Subjective:     Patient ID: Tracy Peters, female   DOB: May 08, 1934, 80 y.o.   MRN: [id number]  Toe Pain    patient states that her right fifth toe has been giving her a lot of problems secondary to inflammation about 3 or 4 weeks ago with possibility for a bite of the spider or other insect. States it's not as bad or swollen but still sore   Review of Systems  All other systems reviewed and are negative.      Objective:   Physical Exam  Nursing note and vitals reviewed. Constitutional: She is oriented to person, place, and time.  Cardiovascular: Intact distal pulses.   Musculoskeletal: Normal range of motion.  Neurological: She is oriented to person, place, and time.  Skin: Skin is warm.   neurovascular status intact with muscle strength adequate and range of motion of the subtalar midtarsal joint mildly diminished. Patient's found to have inflammation right fifth toe which appears to be more in the distal interphalangeal joint versus the nailbed itself with no drainage noted. Patient's digits are well-perfused and arch height is normal     Assessment:     Appears to be an inflammatory condition of the interphalangeal joint    Plan:     H&P and x-rays reviewed and I did a proximal nerve block of this area and then did a small injection of the interphalangeal joint to milligrams dexamethasone Kenalog combination to see if we can reduce the inflammation. It does not get better she will reappoint

## 2014-01-10 ENCOUNTER — Ambulatory Visit: Payer: Self-pay | Admitting: Oncology

## 2014-01-10 ENCOUNTER — Encounter: Payer: Self-pay | Admitting: Oncology

## 2014-01-10 ENCOUNTER — Ambulatory Visit (INDEPENDENT_AMBULATORY_CARE_PROVIDER_SITE_OTHER): Payer: 59 | Admitting: Oncology

## 2014-01-10 VITALS — BP 145/77 | HR 76 | Temp 97.6°F | Ht 65.0 in | Wt 142.0 lb

## 2014-01-10 DIAGNOSIS — I742 Embolism and thrombosis of arteries of the upper extremities: Secondary | ICD-10-CM | POA: Diagnosis not present

## 2014-01-10 DIAGNOSIS — Z01818 Encounter for other preprocedural examination: Secondary | ICD-10-CM

## 2014-01-10 DIAGNOSIS — D485 Neoplasm of uncertain behavior of skin: Secondary | ICD-10-CM | POA: Diagnosis not present

## 2014-01-10 NOTE — Patient Instructions (Signed)
surgery on  cervical spine on Wednesday, August 5.  Surgeon would like her to stop Coumadin 7 days in advance.  patient advised to stop her Coumadin on July 29.  Begin Lovenox 1.5 mg per kilogram subcutaneous, Friday July 31. = 100 mg Resume Lovenox 48 hours postop on August 7.  Resume Coumadin on Thursday, August 6. At home dose: 7.5 mg daily Check PT/INR on Monday, August 10. At [facility] coumadin clinic If therapeutic, discontinue Lovenox at that time.   INR check on 12/27/13.: 2.5 on warfarin 7.5 mg daily We  Discussed  anticoagulation plan above in detail.  Follow visit with Dr Darnell Level 9:45 on 8/17   Lab same day at [facility]

## 2014-01-10 NOTE — Progress Notes (Signed)
Patient ID: Tracy Peters, female   DOB: 1933/12/22, 80 y.o.   MRN: [id number] Referring MD: Dr. Newman Peters  PCP: Tracy Kehr, MD  Reason for Referral: Preoperative evaluation for cervical spine surgery in a lady on chronic anticoagulation status post remote arterial thrombosis of her right index finger.  History and physical exam:  Pleasant 79 year old woman I have followed for many years. She sustained an arterial embolus to an index finger of her right hand involving a right radial artery in 1999 which happened coincidentally with starting a Cox 2 inhibitor. She has been maintained on chronic Coumadin anticoagulation since that time and has had no subsequent thrombotic events.  She has known degenerative arthritis of the spine and has had 2 operations in the past. She tells me that her left knee is deteriorating and surgery also recommended on this in the near future.  She has noted low-grade neck pain and intermittent mild paresthesias of her right hand as far back as January of this year. Over the last 3 weeks she has had almost constant and progressive paresthesias in her right hand. No obvious ischemic changes unless her arm dangles for a long time and then she notes a mild bluish discoloration of the fingertips which resolves when she elevates the arm. At time of my office exam on 09/28/2013, she had mild decrease in the strength of her right hand and decreased sensation to pinprick in a glove distribution over her right hand. I obtained an MRI of the cervical spine on April 29. She had multi-level degenerative arthritis, foraminal stenosis at C4-5 and C5-6. I referred her to Dr. Arnoldo Peters who has operated successfully on her daughter in the past. In addition she has developed progressive pain and weakness and is felt to be a candidate for a laminectomy. This is scheduled for Wednesday, August 5. I am assisting with management of her anticoagulants.  Exam: Blood pressure 145/77, pulse 76,  temperature 97.6 F (36.4 C), temperature source Oral, height 5\' 5"  (1.651 m), weight 142 lb (64.411 kg), SpO2 99.00%. Thin Caucasian woman. Lungs are clear and resonant to percussion Regular cardiac rhythm with no murmur, no peripheral edema Vascular exam: Carotids 2+ no bruits. Radial pulses 2+ symmetric, ulnar pulses 1+ symmetric. No cervical supraclavicular or axillary adenopathy Abdomen soft nontender no mass no organomegaly Neurologic: Weakness in the entire right upper extremity 4/5. Biceps reflex 1+ symmetric. Decreased sensation over the hand. Cranial nerves grossly normal.  Labs: Protime/INR on Coumadin 7.5 mg daily 10 on July 14: 30/2.5 CBC done 09/28/2013: Hemoglobin 12.2, hematocrit 35, MCV 108, white count 7600, platelets 293,000.    Impression:  Idiopathic arterial embolus occurring 16 years ago. Chronic anticoagulation secondary to this.  Recommendation:  patient advised to stop her Coumadin on July 29.  Begin Lovenox 1.5 mg per kilogram subcutaneous, Friday July 31. = 100 mg  Resume Lovenox 48 hours postop on August 7.  Resume Coumadin on Thursday, August 6. At home dose: 7.5 mg daily  Check PT/INR on Monday, August 10. At [facility] coumadin clinic  If therapeutic, discontinue Lovenox at that time.  INR check on 12/27/13.: 2.5 on warfarin 7.5 mg daily   We Discussed anticoagulation plan above in detail.          01/10/2014, 8:40 PM

## 2014-01-11 ENCOUNTER — Ambulatory Visit (HOSPITAL_COMMUNITY)
Admission: RE | Admit: 2014-01-11 | Discharge: 2014-01-11 | Disposition: A | Discharge status: Home / Self Care | Payer: 59 | Source: Ambulatory Visit | Attending: Anesthesiology | Admitting: Anesthesiology

## 2014-01-11 ENCOUNTER — Encounter (HOSPITAL_COMMUNITY): Payer: Self-pay

## 2014-01-11 ENCOUNTER — Encounter (HOSPITAL_COMMUNITY)
Admission: RE | Admit: 2014-01-11 | Discharge: 2014-01-11 | Disposition: A | Discharge status: Home / Self Care | Payer: 59 | Source: Ambulatory Visit | Attending: Neurosurgery | Admitting: Neurosurgery

## 2014-01-11 DIAGNOSIS — Z01818 Encounter for other preprocedural examination: Secondary | ICD-10-CM | POA: Insufficient documentation

## 2014-01-11 LAB — CBC
HEMATOCRIT: 36.4 % (ref 36.0–46.0)
HEMOGLOBIN: 12.4 g/dL (ref 12.0–15.0)
MCH: 37.1 pg — ABNORMAL HIGH (ref 26.0–34.0)
MCHC: 34.1 g/dL (ref 30.0–36.0)
MCV: 109 fL — AB (ref 78.0–100.0)
Platelets: 279 10*3/uL (ref 150–400)
RBC: 3.34 MIL/uL — ABNORMAL LOW (ref 3.87–5.11)
RDW: 12.7 % (ref 11.5–15.5)
WBC: 8.3 10*3/uL (ref 4.0–10.5)

## 2014-01-11 LAB — BASIC METABOLIC PANEL
ANION GAP: 11 (ref 5–15)
BUN: 13 mg/dL (ref 6–23)
CALCIUM: 9.5 mg/dL (ref 8.4–10.5)
CO2: 28 mEq/L (ref 19–32)
CREATININE: 0.79 mg/dL (ref 0.50–1.10)
Chloride: 99 mEq/L (ref 96–112)
GFR calc Af Amer: >90 mL/min (ref >90)
GFR calc non Af Amer: 78 mL/min — ABNORMAL LOW (ref >90)
GLUCOSE: 78 mg/dL (ref 70–99)
Potassium: 5.1 mEq/L (ref 3.7–5.3)
SODIUM: 138 meq/L (ref 137–147)

## 2014-01-11 LAB — SURGICAL PCR SCREEN
MRSA, PCR: NEGATIVE
Staphylococcus aureus: NEGATIVE

## 2014-01-11 NOTE — Progress Notes (Addendum)
Primary = Tracy Peters - dr. Trixie Rude - coags - suggestions for coumadin in his note from 7/28 -- stopped her coumadin today No cardiologist - no recent cardiac testing no ekg in 20 years she says

## 2014-01-11 NOTE — Pre-Procedure Instructions (Signed)
Tracy Peters  01/11/2014   Your procedure is scheduled on:  Wednesday, August 5th  Report to [facility] Admitting at 0930 AM.  Call this number if you have problems the morning of surgery: [phone]   Remember:   Do not eat food or drink liquids after midnight.   Take these medicines the morning of surgery with A SIP OF WATER: tylenol if needed, cipro, allegra, flonase    Do not wear jewelry, make-up or nail polish.  Do not wear lotions, powders, or perfumes. You may wear deodorant.  Do not shave 48 hours prior to surgery. Men may shave face and neck.  Do not bring valuables to the hospital.  [facility] is not responsible  for any belongings or valuables.               Contacts, dentures or bridgework may not be worn into surgery.  Leave suitcase in the car. After surgery it may be brought to your room.  For patients admitted to the hospital, discharge time is determined by your treatment team.           Please read over the following fact sheets that you were given: Pain Booklet, Coughing and Deep Breathing, MRSA Information and Surgical Site Infection Prevention [place] - Preparing for Surgery  Before surgery, you can play an important role.  Because skin is not sterile, your skin needs to be as free of germs as possible.  You can reduce the number of germs on you skin by washing with CHG (chlorahexidine gluconate) soap before surgery.  CHG is an antiseptic cleaner which kills germs and bonds with the skin to continue killing germs even after washing.  Please DO NOT use if you have an allergy to CHG or antibacterial soaps.  If your skin becomes reddened/irritated stop using the CHG and inform your nurse when you arrive at Short Stay.  Do not shave (including legs and underarms) for at least 48 hours prior to the first CHG shower.  You may shave your face.  Please follow these instructions carefully:   1.  Shower with CHG Soap the night before surgery and the  morning of Surgery.  2.  If you choose to wash your hair, wash your hair first as usual with your normal shampoo.  3.  After you shampoo, rinse your hair and body thoroughly to remove the shampoo.  4.  Use CHG as you would any other liquid soap.  You can apply CHG directly to the skin and wash gently with scrungie or a clean washcloth.  5.  Apply the CHG Soap to your body ONLY FROM THE NECK DOWN.  Do not use on open wounds or open sores.  Avoid contact with your eyes, ears, mouth and genitals (private parts).  Wash genitals (private parts) with your normal soap.  6.  Wash thoroughly, paying special attention to the area where your surgery will be performed.  7.  Thoroughly rinse your body with warm water from the neck down.  8.  DO NOT shower/wash with your normal soap after using and rinsing off the CHG Soap.  9.  Pat yourself dry with a clean towel.            10.  Wear clean pajamas.            11.  Place clean sheets on your bed the night of your first shower and do not sleep with pets.  Day of Surgery  Do not apply any lotions/deoderants the morning of surgery.  Please wear clean clothes to the hospital/surgery center.

## 2014-01-12 NOTE — Progress Notes (Signed)
Anesthesia chart review: Patient is a 80 year old female scheduled for C4-5, C5-6, C6-7 ACDF on 01/18/14 by Dr. Arnoldo Morale.  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 PCP is Dr. Alain Marion.  Hematologist is Dr. Beryle Beams who outlined her perioperative anticoagulation instructions on 01/10/14.  She will hold Coumadin starting 01/11/14 and start Lovenox bridge 01/13/14.   EKG on 01/11/14 showed: NSR, septal infarct (age undetermined). Overall, her EKG appears stable when compared to her EKG on 08/31/08.  (Septal infarct was not noted on her 06/24/04 EKG in [place], but I think she may have had lead reversal in V2 and V3.)   CXR on 01/11/14 showed: No active cardiopulmonary disease.  Preoperative labs noted. She is for PT/PTT on the day of surgery.  If her same day lab results are acceptable and otherwise no acute changes that I would anticipate that she could proceed as planned.  George Hugh [facility] Short Stay Center/Anesthesiology Phone [phone] 01/12/2014 12:55 PM

## 2014-01-16 ENCOUNTER — Telehealth: Payer: Self-pay | Admitting: Pharmacist

## 2014-01-16 NOTE — Telephone Encounter (Signed)
Pt called to let us know that she didn't remember to stop her Fish Oil supplement last Wednesday (7 days) prior to her surgery as directed by Dr. Arnoldo Morale office. She took the [employer] last on 01/15/14 in the morning.  She followed the coumadin and lovenox instructions as directed. She wanted to let us know. She called Dr. Adline Mango office and is waiting on a return call. She is scheduled for surgery on 01/18/14

## 2014-01-17 MED ORDER — VANCOMYCIN HCL IN DEXTROSE 1-5 GM/200ML-% IV SOLN
1000.0000 mg | INTRAVENOUS | Status: AC
  Administered 2014-01-18: 1000 mg via INTRAVENOUS
  Filled 2014-01-17: qty 200

## 2014-01-18 ENCOUNTER — Encounter (HOSPITAL_COMMUNITY): Payer: Self-pay | Admitting: Certified Registered Nurse Anesthetist

## 2014-01-18 ENCOUNTER — Encounter (HOSPITAL_COMMUNITY)
Admission: RE | Disposition: A | Discharge status: Home / Self Care | Payer: Self-pay | Source: Ambulatory Visit | Attending: Neurosurgery

## 2014-01-18 ENCOUNTER — Encounter (HOSPITAL_COMMUNITY): Payer: 59 | Admitting: Vascular Surgery

## 2014-01-18 ENCOUNTER — Ambulatory Visit (HOSPITAL_COMMUNITY)
Admission: RE | Admit: 2014-01-18 | Discharge: 2014-01-20 | Disposition: A | Discharge status: Home / Self Care | Payer: 59 | Source: Ambulatory Visit | Attending: Neurosurgery | Admitting: Neurosurgery

## 2014-01-18 ENCOUNTER — Inpatient Hospital Stay (HOSPITAL_COMMUNITY): Payer: 59 | Admitting: Certified Registered Nurse Anesthetist

## 2014-01-18 ENCOUNTER — Inpatient Hospital Stay (HOSPITAL_COMMUNITY): Payer: 59

## 2014-01-18 DIAGNOSIS — M810 Age-related osteoporosis without current pathological fracture: Secondary | ICD-10-CM | POA: Diagnosis not present

## 2014-01-18 DIAGNOSIS — E785 Hyperlipidemia, unspecified: Secondary | ICD-10-CM | POA: Insufficient documentation

## 2014-01-18 DIAGNOSIS — Z7901 Long term (current) use of anticoagulants: Secondary | ICD-10-CM | POA: Diagnosis not present

## 2014-01-18 DIAGNOSIS — Q602 Renal agenesis, unspecified: Secondary | ICD-10-CM | POA: Insufficient documentation

## 2014-01-18 DIAGNOSIS — Q605 Renal hypoplasia, unspecified: Secondary | ICD-10-CM | POA: Diagnosis not present

## 2014-01-18 DIAGNOSIS — M47812 Spondylosis without myelopathy or radiculopathy, cervical region: Secondary | ICD-10-CM | POA: Insufficient documentation

## 2014-01-18 DIAGNOSIS — M4802 Spinal stenosis, cervical region: Secondary | ICD-10-CM | POA: Diagnosis not present

## 2014-01-18 DIAGNOSIS — M4712 Other spondylosis with myelopathy, cervical region: Secondary | ICD-10-CM | POA: Diagnosis present

## 2014-01-18 DIAGNOSIS — K219 Gastro-esophageal reflux disease without esophagitis: Secondary | ICD-10-CM | POA: Insufficient documentation

## 2014-01-18 DIAGNOSIS — I10 Essential (primary) hypertension: Secondary | ICD-10-CM | POA: Diagnosis not present

## 2014-01-18 DIAGNOSIS — Z86718 Personal history of other venous thrombosis and embolism: Secondary | ICD-10-CM | POA: Diagnosis not present

## 2014-01-18 DIAGNOSIS — Z860100 Personal history of colon polyps, unspecified: Secondary | ICD-10-CM | POA: Insufficient documentation

## 2014-01-18 DIAGNOSIS — M5030 Other cervical disc degeneration, unspecified cervical region: Secondary | ICD-10-CM | POA: Diagnosis not present

## 2014-01-18 DIAGNOSIS — G4700 Insomnia, unspecified: Secondary | ICD-10-CM | POA: Diagnosis not present

## 2014-01-18 DIAGNOSIS — M4722 Other spondylosis with radiculopathy, cervical region: Secondary | ICD-10-CM | POA: Diagnosis present

## 2014-01-18 DIAGNOSIS — R1310 Dysphagia, unspecified: Secondary | ICD-10-CM | POA: Diagnosis not present

## 2014-01-18 HISTORY — PX: ANTERIOR CERVICAL DECOMP/DISCECTOMY FUSION: SHX1161

## 2014-01-18 LAB — PROTIME-INR
INR: 0.93 (ref 0.00–1.49)
Prothrombin Time: 12.5 seconds (ref 11.6–15.2)

## 2014-01-18 LAB — APTT: aPTT: 28 seconds (ref 24–37)

## 2014-01-18 SURGERY — ANTERIOR CERVICAL DECOMPRESSION/DISCECTOMY FUSION 3 LEVELS
Anesthesia: General

## 2014-01-18 MED ORDER — PHENOL 1.4 % MT LIQD: 1.0000 | OROMUCOSAL | Status: DC | PRN

## 2014-01-18 MED ORDER — EPHEDRINE SULFATE 50 MG/ML IJ SOLN
INTRAMUSCULAR | Status: DC | PRN
  Administered 2014-01-18: 10 mg via INTRAVENOUS

## 2014-01-18 MED ORDER — PROPOFOL 10 MG/ML IV BOLUS
INTRAVENOUS | Status: AC
  Filled 2014-01-18: qty 20

## 2014-01-18 MED ORDER — SODIUM CHLORIDE 0.9 % IR SOLN
Status: DC | PRN
  Administered 2014-01-18: 14:00:00

## 2014-01-18 MED ORDER — THROMBIN 20000 UNITS EX SOLR
CUTANEOUS | Status: DC | PRN
  Administered 2014-01-18: 14:00:00 via TOPICAL

## 2014-01-18 MED ORDER — DEXAMETHASONE SODIUM PHOSPHATE 4 MG/ML IJ SOLN: 4.0000 mg | Freq: Four times a day (QID) | INTRAMUSCULAR | Status: AC

## 2014-01-18 MED ORDER — BUPIVACAINE-EPINEPHRINE (PF) 0.5% -1:200000 IJ SOLN
INTRAMUSCULAR | Status: DC | PRN
  Administered 2014-01-18: 10 mL

## 2014-01-18 MED ORDER — DEXAMETHASONE SODIUM PHOSPHATE 4 MG/ML IJ SOLN
INTRAMUSCULAR | Status: DC | PRN
  Administered 2014-01-18: 8 mg via INTRAVENOUS

## 2014-01-18 MED ORDER — DEXAMETHASONE SODIUM PHOSPHATE 4 MG/ML IJ SOLN
INTRAMUSCULAR | Status: AC
  Filled 2014-01-18: qty 2

## 2014-01-18 MED ORDER — MIDAZOLAM HCL 2 MG/2ML IJ SOLN: 0.5000 mg | Freq: Once | INTRAMUSCULAR | Status: DC | PRN

## 2014-01-18 MED ORDER — ZOLPIDEM TARTRATE 5 MG PO TABS: 5.0000 mg | ORAL_TABLET | Freq: Every evening | ORAL | Status: DC | PRN

## 2014-01-18 MED ORDER — FENTANYL CITRATE 0.05 MG/ML IJ SOLN
INTRAMUSCULAR | Status: DC | PRN
  Administered 2014-01-18 (×2): 50 ug via INTRAVENOUS
  Administered 2014-01-18: 200 ug via INTRAVENOUS
  Administered 2014-01-18 (×3): 50 ug via INTRAVENOUS

## 2014-01-18 MED ORDER — DOCUSATE SODIUM 100 MG PO CAPS
100.0000 mg | ORAL_CAPSULE | Freq: Two times a day (BID) | ORAL | Status: DC
  Administered 2014-01-18 – 2014-01-19 (×2): 100 mg via ORAL
  Filled 2014-01-18 (×5): qty 1

## 2014-01-18 MED ORDER — VANCOMYCIN HCL 10 G IV SOLR
1250.0000 mg | INTRAVENOUS | Status: DC
  Administered 2014-01-20: 1250 mg via INTRAVENOUS
  Filled 2014-01-18 (×3): qty 1250

## 2014-01-18 MED ORDER — OXYCODONE HCL 5 MG/5ML PO SOLN: 5.0000 mg | Freq: Once | ORAL | Status: DC | PRN

## 2014-01-18 MED ORDER — FENTANYL CITRATE 0.05 MG/ML IJ SOLN
INTRAMUSCULAR | Status: AC
  Filled 2014-01-18: qty 5

## 2014-01-18 MED ORDER — ACETAMINOPHEN 325 MG PO TABS: 650.0000 mg | ORAL_TABLET | ORAL | Status: DC | PRN

## 2014-01-18 MED ORDER — PROPOFOL 10 MG/ML IV BOLUS
INTRAVENOUS | Status: DC | PRN
  Administered 2014-01-18: 100 mg via INTRAVENOUS

## 2014-01-18 MED ORDER — HYDROMORPHONE HCL PF 1 MG/ML IJ SOLN
INTRAMUSCULAR | Status: AC
  Filled 2014-01-18: qty 1

## 2014-01-18 MED ORDER — DIAZEPAM 5 MG PO TABS
ORAL_TABLET | ORAL | Status: AC
  Filled 2014-01-18: qty 1

## 2014-01-18 MED ORDER — GLYCOPYRROLATE 0.2 MG/ML IJ SOLN
INTRAMUSCULAR | Status: AC
  Filled 2014-01-18: qty 2

## 2014-01-18 MED ORDER — ESMOLOL HCL 10 MG/ML IV SOLN
INTRAVENOUS | Status: AC
  Filled 2014-01-18: qty 10

## 2014-01-18 MED ORDER — ONDANSETRON HCL 4 MG/2ML IJ SOLN: 4.0000 mg | INTRAMUSCULAR | Status: DC | PRN

## 2014-01-18 MED ORDER — MIDAZOLAM HCL 2 MG/2ML IJ SOLN
INTRAMUSCULAR | Status: AC
  Filled 2014-01-18: qty 2

## 2014-01-18 MED ORDER — ACETAMINOPHEN 650 MG RE SUPP: 650.0000 mg | RECTAL | Status: DC | PRN

## 2014-01-18 MED ORDER — NEOSTIGMINE METHYLSULFATE 10 MG/10ML IV SOLN
INTRAVENOUS | Status: DC | PRN
  Administered 2014-01-18: 3 mg via INTRAVENOUS

## 2014-01-18 MED ORDER — ARTIFICIAL TEARS OP OINT
TOPICAL_OINTMENT | OPHTHALMIC | Status: AC
  Filled 2014-01-18: qty 3.5

## 2014-01-18 MED ORDER — TRAMADOL HCL 50 MG PO TABS: 50.0000 mg | ORAL_TABLET | Freq: Two times a day (BID) | ORAL | Status: DC | PRN

## 2014-01-18 MED ORDER — PHENYLEPHRINE HCL 10 MG/ML IJ SOLN
INTRAMUSCULAR | Status: DC | PRN
  Administered 2014-01-18 (×3): 80 ug via INTRAVENOUS
  Administered 2014-01-18: 40 ug via INTRAVENOUS

## 2014-01-18 MED ORDER — DIAZEPAM 5 MG PO TABS
5.0000 mg | ORAL_TABLET | Freq: Four times a day (QID) | ORAL | Status: DC | PRN
  Administered 2014-01-18 – 2014-01-19 (×4): 5 mg via ORAL
  Filled 2014-01-18 (×3): qty 1

## 2014-01-18 MED ORDER — LOSARTAN POTASSIUM 50 MG PO TABS
50.0000 mg | ORAL_TABLET | Freq: Every day | ORAL | Status: DC
  Filled 2014-01-18 (×3): qty 1

## 2014-01-18 MED ORDER — ROCURONIUM BROMIDE 100 MG/10ML IV SOLN
INTRAVENOUS | Status: DC | PRN
  Administered 2014-01-18: 40 mg via INTRAVENOUS

## 2014-01-18 MED ORDER — MORPHINE SULFATE 2 MG/ML IJ SOLN: 1.0000 mg | INTRAMUSCULAR | Status: DC | PRN

## 2014-01-18 MED ORDER — LACTATED RINGERS IV SOLN
INTRAVENOUS | Status: DC | PRN
  Administered 2014-01-18 (×2): via INTRAVENOUS

## 2014-01-18 MED ORDER — MIDAZOLAM HCL 5 MG/5ML IJ SOLN
INTRAMUSCULAR | Status: DC | PRN
  Administered 2014-01-18 (×2): 1 mg via INTRAVENOUS

## 2014-01-18 MED ORDER — ONDANSETRON HCL 4 MG/2ML IJ SOLN
INTRAMUSCULAR | Status: AC
  Filled 2014-01-18: qty 2

## 2014-01-18 MED ORDER — DEXAMETHASONE 4 MG PO TABS
4.0000 mg | ORAL_TABLET | Freq: Four times a day (QID) | ORAL | Status: AC
  Administered 2014-01-18 – 2014-01-19 (×3): 4 mg via ORAL
  Filled 2014-01-18: qty 1

## 2014-01-18 MED ORDER — MENTHOL 3 MG MT LOZG
1.0000 | LOZENGE | OROMUCOSAL | Status: DC | PRN
  Filled 2014-01-18: qty 9

## 2014-01-18 MED ORDER — METOCLOPRAMIDE HCL 5 MG/ML IJ SOLN
INTRAMUSCULAR | Status: AC
  Filled 2014-01-18: qty 2

## 2014-01-18 MED ORDER — HYDROMORPHONE HCL PF 1 MG/ML IJ SOLN
0.2500 mg | INTRAMUSCULAR | Status: DC | PRN
  Administered 2014-01-18 (×2): 0.5 mg via INTRAVENOUS

## 2014-01-18 MED ORDER — ONDANSETRON HCL 4 MG/2ML IJ SOLN
INTRAMUSCULAR | Status: DC | PRN
  Administered 2014-01-18: 4 mg via INTRAVENOUS

## 2014-01-18 MED ORDER — NEOSTIGMINE METHYLSULFATE 10 MG/10ML IV SOLN
INTRAVENOUS | Status: AC
  Filled 2014-01-18: qty 1

## 2014-01-18 MED ORDER — BACITRACIN ZINC 500 UNIT/GM EX OINT
TOPICAL_OINTMENT | CUTANEOUS | Status: DC | PRN
  Administered 2014-01-18: 1 via TOPICAL

## 2014-01-18 MED ORDER — ALBUMIN HUMAN 5 % IV SOLN
INTRAVENOUS | Status: DC | PRN
  Administered 2014-01-18: 14:00:00 via INTRAVENOUS

## 2014-01-18 MED ORDER — PROMETHAZINE HCL 25 MG/ML IJ SOLN: 6.2500 mg | INTRAMUSCULAR | Status: DC | PRN

## 2014-01-18 MED ORDER — HYDROCODONE-ACETAMINOPHEN 5-325 MG PO TABS: 1.0000 | ORAL_TABLET | ORAL | Status: DC | PRN

## 2014-01-18 MED ORDER — ALUM & MAG HYDROXIDE-SIMETH 200-200-20 MG/5ML PO SUSP: 30.0000 mL | Freq: Four times a day (QID) | ORAL | Status: DC | PRN

## 2014-01-18 MED ORDER — FLUTICASONE PROPIONATE 50 MCG/ACT NA SUSP
2.0000 | Freq: Every day | NASAL | Status: DC
  Filled 2014-01-18: qty 16

## 2014-01-18 MED ORDER — LACTATED RINGERS IV SOLN: INTRAVENOUS | Status: DC

## 2014-01-18 MED ORDER — VALACYCLOVIR HCL 500 MG PO TABS
1000.0000 mg | ORAL_TABLET | Freq: Two times a day (BID) | ORAL | Status: DC
  Filled 2014-01-18 (×5): qty 2

## 2014-01-18 MED ORDER — OXYCODONE HCL 5 MG PO TABS: 5.0000 mg | ORAL_TABLET | Freq: Once | ORAL | Status: DC | PRN

## 2014-01-18 MED ORDER — PANTOPRAZOLE SODIUM 40 MG IV SOLR
40.0000 mg | Freq: Every day | INTRAVENOUS | Status: DC
  Filled 2014-01-18: qty 40

## 2014-01-18 MED ORDER — LIDOCAINE HCL (CARDIAC) 20 MG/ML IV SOLN
INTRAVENOUS | Status: AC
  Filled 2014-01-18: qty 5

## 2014-01-18 MED ORDER — LACTATED RINGERS IV SOLN
INTRAVENOUS | Status: DC
  Administered 2014-01-18: 10:00:00 via INTRAVENOUS

## 2014-01-18 MED ORDER — ROCURONIUM BROMIDE 50 MG/5ML IV SOLN
INTRAVENOUS | Status: AC
  Filled 2014-01-18: qty 1

## 2014-01-18 MED ORDER — GLYCOPYRROLATE 0.2 MG/ML IJ SOLN
INTRAMUSCULAR | Status: DC | PRN
  Administered 2014-01-18: 0.4 mg via INTRAVENOUS

## 2014-01-18 MED ORDER — OXYCODONE-ACETAMINOPHEN 5-325 MG PO TABS
1.0000 | ORAL_TABLET | ORAL | Status: DC | PRN
  Administered 2014-01-18: 1 via ORAL
  Administered 2014-01-19 – 2014-01-20 (×5): 2 via ORAL
  Filled 2014-01-18 (×3): qty 2
  Filled 2014-01-18: qty 1
  Filled 2014-01-18: qty 2
  Filled 2014-01-18 (×2): qty 1

## 2014-01-18 MED ORDER — PANTOPRAZOLE SODIUM 40 MG PO TBEC
40.0000 mg | DELAYED_RELEASE_TABLET | Freq: Every day | ORAL | Status: DC
  Administered 2014-01-18: 40 mg via ORAL
  Filled 2014-01-18: qty 1

## 2014-01-18 MED ORDER — SCOPOLAMINE 1 MG/3DAYS TD PT72
MEDICATED_PATCH | TRANSDERMAL | Status: AC
  Filled 2014-01-18: qty 1

## 2014-01-18 MED ORDER — METOCLOPRAMIDE HCL 5 MG/ML IJ SOLN
INTRAMUSCULAR | Status: DC | PRN
  Administered 2014-01-18: 10 mg via INTRAVENOUS

## 2014-01-18 MED ORDER — ESMOLOL HCL 10 MG/ML IV SOLN
INTRAVENOUS | Status: DC | PRN
  Administered 2014-01-18: 20 mg via INTRAVENOUS

## 2014-01-18 SURGICAL SUPPLY — 63 items
BAG DECANTER FOR FLEXI CONT (MISCELLANEOUS) ×3 IMPLANT
BENZOIN TINCTURE PRP APPL 2/3 (GAUZE/BANDAGES/DRESSINGS) ×3 IMPLANT
BIT DRILL NEURO 2X3.1 SFT TUCH (MISCELLANEOUS) ×1 IMPLANT
BLADE SURG 15 STRL LF DISP TIS (BLADE) ×1 IMPLANT
BLADE SURG 15 STRL SS (BLADE) ×2
BLADE ULTRA TIP 2M (BLADE) ×3 IMPLANT
BRUSH SCRUB EZ PLAIN DRY (MISCELLANEOUS) ×3 IMPLANT
BUR BARREL STRAIGHT FLUTE 4.0 (BURR) ×3 IMPLANT
CANISTER SUCT 3000ML (MISCELLANEOUS) ×3 IMPLANT
CLOSURE WOUND 1/2 X4 (GAUZE/BANDAGES/DRESSINGS) ×1
CONT SPEC 4OZ CLIKSEAL STRL BL (MISCELLANEOUS) ×3 IMPLANT
COVER MAYO STAND STRL (DRAPES) ×3 IMPLANT
DEVICE FUSION VIST S 14X14X6MM (Trauma) ×1 IMPLANT
DRAIN JACKSON PRATT 10MM FLAT (MISCELLANEOUS) ×3 IMPLANT
DRAPE LAPAROTOMY 100X72 PEDS (DRAPES) ×3 IMPLANT
DRAPE MICROSCOPE LEICA (MISCELLANEOUS) IMPLANT
DRAPE POUCH INSTRU U-SHP 10X18 (DRAPES) ×3 IMPLANT
DRAPE SURG 17X23 STRL (DRAPES) ×6 IMPLANT
DRILL NEURO 2X3.1 SOFT TOUCH (MISCELLANEOUS) ×3
ELECT REM PT RETURN 9FT ADLT (ELECTROSURGICAL) ×3
ELECTRODE REM PT RTRN 9FT ADLT (ELECTROSURGICAL) ×1 IMPLANT
EVACUATOR SILICONE 100CC (DRAIN) ×3 IMPLANT
GAUZE SPONGE 4X4 12PLY STRL (GAUZE/BANDAGES/DRESSINGS) ×3 IMPLANT
GAUZE SPONGE 4X4 16PLY XRAY LF (GAUZE/BANDAGES/DRESSINGS) IMPLANT
GLOVE BIO SURGEON STRL SZ8.5 (GLOVE) ×9 IMPLANT
GLOVE BIOGEL PI IND STRL 7.5 (GLOVE) ×3 IMPLANT
GLOVE BIOGEL PI INDICATOR 7.5 (GLOVE) ×6
GLOVE EXAM NITRILE LRG STRL (GLOVE) IMPLANT
GLOVE EXAM NITRILE MD LF STRL (GLOVE) IMPLANT
GLOVE EXAM NITRILE XL STR (GLOVE) IMPLANT
GLOVE EXAM NITRILE XS STR PU (GLOVE) IMPLANT
GLOVE SS BIOGEL STRL SZ 8 (GLOVE) ×3 IMPLANT
GLOVE SUPERSENSE BIOGEL SZ 8 (GLOVE) ×6
GLOVE SURG SS PI 7.0 STRL IVOR (GLOVE) ×9 IMPLANT
GOWN STRL REUS W/ TWL LRG LVL3 (GOWN DISPOSABLE) IMPLANT
GOWN STRL REUS W/ TWL XL LVL3 (GOWN DISPOSABLE) ×4 IMPLANT
GOWN STRL REUS W/TWL LRG LVL3 (GOWN DISPOSABLE)
GOWN STRL REUS W/TWL XL LVL3 (GOWN DISPOSABLE) ×8
KIT BASIN OR (CUSTOM PROCEDURE TRAY) ×3 IMPLANT
KIT ROOM TURNOVER OR (KITS) ×3 IMPLANT
MARKER SKIN DUAL TIP RULER LAB (MISCELLANEOUS) ×3 IMPLANT
NEEDLE HYPO 22GX1.5 SAFETY (NEEDLE) ×3 IMPLANT
NEEDLE SPNL 18GX3.5 QUINCKE PK (NEEDLE) ×3 IMPLANT
NS IRRIG 1000ML POUR BTL (IV SOLUTION) ×3 IMPLANT
PACK LAMINECTOMY NEURO (CUSTOM PROCEDURE TRAY) ×3 IMPLANT
PEEK VISTA 14X14X7MM (Peek) ×3 IMPLANT
PEEK VISTA 14X14X8MM (Peek) ×3 IMPLANT
PIN DISTRACTION 14MM (PIN) ×6 IMPLANT
PLATE ANT CERV XTEND 3 LV 45 (Plate) ×3 IMPLANT
PUTTY 5ML ACTIFUSE ABX (Putty) ×3 IMPLANT
RUBBERBAND STERILE (MISCELLANEOUS) IMPLANT
SCREW VAR 4.2 XD SELF DRILL 12 (Screw) ×24 IMPLANT
SPONGE INTESTINAL PEANUT (DISPOSABLE) ×6 IMPLANT
SPONGE SURGIFOAM ABS GEL 100 (HEMOSTASIS) ×3 IMPLANT
STRIP CLOSURE SKIN 1/2X4 (GAUZE/BANDAGES/DRESSINGS) ×2 IMPLANT
SUT VIC AB 0 CT1 27 (SUTURE) ×2
SUT VIC AB 0 CT1 27XBRD ANTBC (SUTURE) ×1 IMPLANT
SUT VIC AB 3-0 SH 8-18 (SUTURE) ×3 IMPLANT
SYR 20ML ECCENTRIC (SYRINGE) ×3 IMPLANT
TOWEL OR 17X24 6PK STRL BLUE (TOWEL DISPOSABLE) ×3 IMPLANT
TOWEL OR 17X26 10 PK STRL BLUE (TOWEL DISPOSABLE) ×3 IMPLANT
VISTA S O 14X14X6MM (Trauma) ×3 IMPLANT
WATER STERILE IRR 1000ML POUR (IV SOLUTION) ×3 IMPLANT

## 2014-01-18 NOTE — H&P (Signed)
Subjective: The patient is a 80 year old white female who has complained of neck, shoulder and arm pain numbness and tingling consistent with a cervical radiculopathy. She has failed medical management and was worked up with a cervical MRI. This demonstrated multilevel disc degeneration and spondylosis most prominent at C4-5, C5-6 and C6-7. I discussed the various treatment options with the patient including surgery. She has weighed the risks, benefits, and alternatives surgery and decided proceed with a C4-5, C5-6 and C6-7 anterior cervical discectomy, fusion, and plating.   Past Medical History  Diagnosis Date  . LBP (low back pain)   . Hypertension   . Long term (current) use of anticoagulants     Dr. Beryle Beams  . History of DVT (deep vein thrombosis)   . GERD (gastroesophageal reflux disease)     mild  . Insomnia   . Herpes   . Allergic rhinitis   . Hyperlipidemia     refusing statins  . Diarrhea     off and on X 6 years  . Adenomatous colon polyp   . IBS (irritable bowel syndrome)   . UTI (urinary tract infection)   . 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 Anemia   . Blood transfusion   . Osteoporosis   . Congenital absence of one kidney   . Chronic anticoagulation 03/21/2013    Past Surgical History  Procedure Laterality Date  . Cholecystectomy    . Abdominal hysterectomy    . Appendectomy    . Breast surgery      cystectomy-benign  . Lumbar laminectomy      X 2  . Knee surgery      left x 3  . Shoulder surgery      Right  . Back surgery      2    Allergies  Allergen Reactions  . Bee Venom Anaphylaxis    Yellow jackets, wasps as well  . Ivp Dye [Iodinated Diagnostic Agents] Anaphylaxis  . Shellfish Allergy Anaphylaxis  . Amlodipine Besylate     REACTION: swelling  . Aspirin     REACTION: unspecified  . Atorvastatin     REACTION: diarrhea  . Cholestyramine     REACTION: mouth irritation  . Codeine  Phosphate Nausea And Vomiting  . Demerol [Meperidine]     Severe Hallucination!!!!  . Diphenhydramine Hcl Other (See Comments)    hyperactive  . Doxycycline     REACTION: nausea  . Gentamicin Sulfate     REACTION: unspecified  . Meclizine Hcl     REACTION: more dizziness on it  . Meperidine Hcl   . Metronidazole     REACTION: bad taste  . Moxifloxacin     REACTION: insomnia Able to use Cipro  . Sulfamethoxazole     Kidney problems  . Penicillins Swelling and Rash    History  Substance Use Topics  . Smoking status: Never Smoker   . Smokeless tobacco: Never Used  . Alcohol Use: No    Family History  Problem Relation Age of Onset  . Breast cancer Sister   . Kidney disease Sister 60    nephritis  . Heart disease Brother   . Osteoarthritis Other   . Parkinsonism Mother   . Ulcerative colitis Sister   . Colon cancer Neg Hx    Prior to Admission medications   Medication Sig Start Date End Date Taking? Authorizing Provider  acetaminophen (TYLENOL) 500 MG tablet Take 500 mg  by mouth as needed. For headache    Yes Historical Provider, MD  ciprofloxacin (CIPRO) 250 MG tablet Take 1 tablet (250 mg total) by mouth 2 (two) times daily. 12/28/13  Yes Aleksei Plotnikov V, MD  enoxaparin (LOVENOX) 100 MG/ML injection Inject 100 mg into the skin daily. Daily for 4 nights. Completed on 01/16/14   Yes Historical Provider, MD  fish oil-omega-3 fatty acids 1000 MG capsule Take 2 g by mouth daily.    Yes Historical Provider, MD  fluticasone (FLONASE) 50 MCG/ACT nasal spray Place 2 sprays into both nostrils daily.   Yes Historical Provider, MD  losartan (COZAAR) 100 MG tablet Take 50 mg by mouth daily.   Yes Historical Provider, MD  Probiotic Product (ALIGN) 4 MG CAPS Take by mouth daily.     Yes Historical Provider, MD  traMADol (ULTRAM) 50 MG tablet Take 50-100 mg by mouth every 12 (twelve) hours as needed for moderate pain.   Yes Historical Provider, MD  valACYclovir (VALTREX) 500 MG tablet  Take 2 tablets (1,000 mg total) by mouth 2 (two) times daily.   Yes Aleksei Plotnikov V, MD  warfarin (COUMADIN) 5 MG tablet Take 7.5 mg by mouth daily.   Yes Historical Provider, MD  zolpidem (AMBIEN) 10 MG tablet Take 10 mg by mouth at bedtime as needed for sleep.   Yes Historical Provider, MD  acyclovir ointment (ZOVIRAX) 5 % Apply 1 application topically daily as needed (break out).    Historical Provider, MD  diphenoxylate-atropine (LOMOTIL) 2.5-0.025 MG per tablet Take 1 tablet by mouth 2 (two) times daily as needed for diarrhea or loose stools.     Historical Provider, MD  EPINEPHrine (EPIPEN) 0.3 mg/0.3 mL IJ SOAJ injection As dirrected 12/28/13   Aleksei Plotnikov V, MD  fexofenadine (ALLEGRA) 180 MG tablet Take 180 mg by mouth daily as needed for allergies.  05/28/11   Aleksei Plotnikov V, MD  loperamide (IMODIUM A-D) 2 MG tablet Take 2 mg by mouth 4 (four) times daily as needed for diarrhea or loose stools. Will take one by mouth 76min before meals or two as needed.    Historical Provider, MD  triamcinolone ointment (KENALOG) 0.5 % Apply 1 application topically 3 (three) times daily. 06/29/13   Aleksei Plotnikov V, MD     Review of Systems  Positive ROS: As above  All other systems have been reviewed and were otherwise negative with the exception of those mentioned in the HPI and as above.  Objective: Vital signs in last 24 hours: Temp:  [98 F (36.7 C)] 98 F (36.7 C) (08/05 0903) Pulse Rate:  [68] 68 (08/05 0903) Resp:  [18] 18 (08/05 0903) BP: (160)/(77) 160/77 mmHg (08/05 0903) SpO2:  [100 %] 100 % (08/05 0903) Weight:  [64.411 kg (142 lb)] 64.411 kg (142 lb) (08/05 0903)  General Appearance: Alert, cooperative, no distress, Head: Normocephalic, without obvious abnormality, atraumatic Eyes: PERRL, conjunctiva/corneas clear, EOM's intact,    Ears: Normal  Throat: Normal  Neck: Supple, symmetrical, trachea midline, no adenopathy; thyroid: No enlargement/tenderness/nodules;  no carotid bruit or JVD Back: Symmetric, no curvature, ROM normal, no CVA tenderness Lungs: Clear to auscultation bilaterally, respirations unlabored Heart: Regular rate and rhythm, no murmur, rub or gallop Abdomen: Soft, non-tender,, no masses, no organomegaly Extremities: Extremities normal, atraumatic, no cyanosis or edema Pulses: 2+ and symmetric all extremities Skin: Skin color, texture, turgor normal, no rashes or lesions  NEUROLOGIC:   Mental status: alert and oriented, no aphasia, good attention span, [employer]  of knowledge/ memory ok Motor Exam - grossly normal Sensory Exam - grossly normal Reflexes:  Coordination - grossly normal Gait - grossly normal Balance - grossly normal Cranial Nerves: I: smell Not tested  II: visual acuity  OS: Normal  OD: Normal   II: visual fields Full to confrontation  II: pupils Equal, round, reactive to light  III,VII: ptosis None  III,IV,VI: extraocular muscles  Full ROM  V: mastication Normal  V: facial light touch sensation  Normal  V,VII: corneal reflex  Present  VII: facial muscle function - upper  Normal  VII: facial muscle function - lower Normal  VIII: hearing Not tested  IX: soft palate elevation  Normal  IX,X: gag reflex Present  XI: trapezius strength  5/5  XI: sternocleidomastoid strength 5/5  XI: neck flexion strength  5/5  XII: tongue strength  Normal    Data Review Lab Results  Component Value Date   WBC 8.3 01/11/2014   HGB 12.4 01/11/2014   HCT 36.4 01/11/2014   MCV 109.0* 01/11/2014   PLT 279 01/11/2014   Lab Results  Component Value Date   NA 138 01/11/2014   K 5.1 01/11/2014   CL 99 01/11/2014   CO2 28 01/11/2014   BUN 13 01/11/2014   CREATININE 0.79 01/11/2014   GLUCOSE 78 01/11/2014   Lab Results  Component Value Date   INR 0.93 01/18/2014   PROTIME 30.0* 12/27/2013    Assessment/Plan: C4-5, C5-6 and C6-7 disc degeneration, spondylosis, stenosis, cervicalgia, cervical radiculopathy: I discussed situation with  the patient. I have reviewed her imaging studies with her and pointed out the abnormalities. We have discussed the various treatment options including surgery. I described the surgical treatment option of a C4-5, C5-6 and C6-7 anterior cervical discectomy, fusion, and plating. I've shown her surgical models. We have discussed the risks, benefits, alternatives, and likelihood of achieving our goals with surgery. I have answered all the patient's questions. She has decided to proceed with surgery.   Kindle Strohmeier D 01/18/2014 11:28 AM

## 2014-01-18 NOTE — Anesthesia Preprocedure Evaluation (Addendum)
Anesthesia Evaluation  Patient identified by MRN, date of birth, ID band Patient awake    Reviewed: Allergy & Precautions, H&P , NPO status , Patient's Chart, lab work & pertinent test results  History of Anesthesia Complications Negative for: history of anesthetic complications  Airway Mallampati: I TM Distance: >3 FB Neck ROM: Full    Dental  (+) Teeth Intact, Dental Advisory Given   Pulmonary neg pulmonary ROS,  breath sounds clear to auscultation        Cardiovascular hypertension, Pt. on medications - anginaDVT (blood clot R hand) Rhythm:Regular Rate:Normal     Neuro/Psych Chronic neck pain, numb R arm    GI/Hepatic negative GI ROS, Neg liver ROS,   Endo/Other  negative endocrine ROSraynaud's  Renal/GU negative Renal ROS     Musculoskeletal   Abdominal   Peds  Hematology  (+) Blood dyscrasia (off coumadin since wed, INR 0.93 today), ,   Anesthesia Other Findings   Reproductive/Obstetrics                          Anesthesia Physical Anesthesia Plan  ASA: III  Anesthesia Plan:    Post-op Pain Management:    Induction: Intravenous  Airway Management Planned: Oral ETT  Additional Equipment:   Intra-op Plan:   Post-operative Plan: Extubation in OR  Informed Consent: I have reviewed the patients History and Physical, chart, labs and discussed the procedure including the risks, benefits and alternatives for the proposed anesthesia with the patient or authorized representative who has indicated his/her understanding and acceptance.   Dental advisory given  Plan Discussed with: CRNA and Surgeon  Anesthesia Plan Comments: (Plan routine monitors, GETA)        Anesthesia Quick Evaluation

## 2014-01-18 NOTE — Anesthesia Procedure Notes (Signed)
Procedure Name: Intubation Date/Time: 01/18/2014 12:09 PM Performed by: Eligha Bridegroom Pre-anesthesia Checklist: Patient identified, Patient being monitored, Emergency Drugs available, Timeout performed and Suction available Patient Re-evaluated:Patient Re-evaluated prior to inductionOxygen Delivery Method: Circle system utilized Preoxygenation: Pre-oxygenation with 100% oxygen Intubation Type: IV induction Ventilation: Mask ventilation without difficulty and Oral airway inserted - appropriate to patient size Laryngoscope Size: Mac and 3 Grade View: Grade I Tube type: Oral Tube size: 7.5 mm Number of attempts: 1 Airway Equipment and Method: Stylet Placement Confirmation: ETT inserted through vocal cords under direct vision,  breath sounds checked- equal and bilateral and positive ETCO2 Secured at: 21 cm Tube secured with: Tape Dental Injury: Teeth and Oropharynx as per pre-operative assessment

## 2014-01-18 NOTE — Transfer of Care (Signed)
Immediate Anesthesia Transfer of Care Note  Patient: Tracy Peters  Procedure(s) Performed: Procedure(s) with comments: ANTERIOR CERVICAL DECOMPRESSION/DISCECTOMY FUSION 3 LEVELS  Cervical  four/five, five/six, six/seven anterior cervical decompression with fusion interbody prosthesis with plating and bonegraft (N/A) - t  Patient Location: PACU  Anesthesia Type:General  Level of Consciousness: awake, alert , oriented and patient cooperative  Airway & Oxygen Therapy: Patient Spontanous Breathing and Patient connected to face mask oxygen  Post-op Assessment: Report given to PACU RN, Post -op Vital signs reviewed and stable and Patient moving all extremities X 4  Post vital signs: Reviewed and stable  Complications: No apparent anesthesia complications

## 2014-01-18 NOTE — Plan of Care (Signed)
Problem: Consults Goal: Diagnosis - Spinal Surgery Outcome: Completed/Met Date Met:  01/18/14 Cervical Spine Fusion

## 2014-01-18 NOTE — Anesthesia Postprocedure Evaluation (Signed)
  Anesthesia Post-op Note  Patient: Tracy Peters  Procedure(s) Performed: Procedure(s) with comments: ANTERIOR CERVICAL DECOMPRESSION/DISCECTOMY FUSION 3 LEVELS  Cervical  four/five, five/six, six/seven anterior cervical decompression with fusion interbody prosthesis with plating and bonegraft (N/A) - t  Patient Location: PACU  Anesthesia Type:General  Level of Consciousness: awake, alert , oriented and patient cooperative  Airway and Oxygen Therapy: Patient Spontanous Breathing and Patient connected to nasal cannula oxygen  Post-op Pain: mild  Post-op Assessment: Post-op Vital signs reviewed, Patient's Cardiovascular Status Stable, Respiratory Function Stable, Patent Airway, No signs of Nausea or vomiting and Pain level controlled  Post-op Vital Signs: Reviewed and stable  Last Vitals:  Filed Vitals:   01/18/14 1615  BP: 141/67  Pulse: 81  Temp:   Resp: 13    Complications: No apparent anesthesia complications

## 2014-01-18 NOTE — Progress Notes (Signed)
ANTIBIOTIC CONSULT NOTE - INITIAL  Pharmacy Consult for vancomycin Indication: surgical prophylaxis  Allergies  Allergen Reactions  . Bee Venom Anaphylaxis    Yellow jackets, wasps as well  . Ivp Dye [Iodinated Diagnostic Agents] Anaphylaxis  . Shellfish Allergy Anaphylaxis  . Amlodipine Besylate     REACTION: swelling  . Aspirin     REACTION: unspecified  . Atorvastatin     REACTION: diarrhea  . Cholestyramine     REACTION: mouth irritation  . Codeine Phosphate Nausea And Vomiting  . Demerol [Meperidine]     Severe Hallucination!!!!  . Diphenhydramine Hcl Other (See Comments)    hyperactive  . Doxycycline     REACTION: nausea  . Gentamicin Sulfate     REACTION: unspecified  . Meclizine Hcl     REACTION: more dizziness on it  . Meperidine Hcl   . Metronidazole     REACTION: bad taste  . Moxifloxacin     REACTION: insomnia Able to use Cipro  . Sulfamethoxazole     Kidney problems  . Penicillins Swelling and Rash    Patient Measurements: Weight: 142 lb (64.411 kg)   Vital Signs: Temp: 98.6 F (37 C) (08/05 1716) Temp src: Oral (08/05 0903) BP: 151/72 mmHg (08/05 1716) Pulse Rate: 70 (08/05 1716) Intake/Output from previous day:   Intake/Output from this shift: Total I/O In: 1990 [P.O.:240; I.V.:1500; IV Piggyback:250] Out: 300 [Blood:300]  Labs: No results found for this basename: WBC, HGB, PLT, LABCREA, CREATININE,  in the last 72 hours The CrCl is unknown because both a height and weight (above a minimum accepted value) are required for this calculation. No results found for this basename: VANCOTROUGH, Corlis Leak, VANCORANDOM, GENTTROUGH, GENTPEAK, GENTRANDOM, TOBRATROUGH, TOBRAPEAK, TOBRARND, AMIKACINPEAK, AMIKACINTROU, AMIKACIN,  in the last 72 hours   Microbiology: Recent Results (from the past 720 hour(s))  SURGICAL PCR SCREEN     Status: None   Collection Time    01/11/14 11:35 AM      Result Value Ref Range Status   MRSA, PCR NEGATIVE   NEGATIVE Final   Staphylococcus aureus NEGATIVE  NEGATIVE Final   Comment:            The Xpert SA Assay (FDA     approved for NASAL specimens     in patients over 28 years of age),     is one component of     a comprehensive surveillance     program.  Test performance has     been validated by [employer] for patients greater     than or equal to 75 year old.     It is not intended     to diagnose infection nor to     guide or monitor treatment.    Medical History: Past Medical History  Diagnosis Date  . LBP (low back pain)   . Hypertension   . Long term (current) use of anticoagulants     Dr. Beryle Beams  . History of DVT (deep vein thrombosis)   . GERD (gastroesophageal reflux disease)     mild  . Insomnia   . Herpes   . Allergic rhinitis   . Hyperlipidemia     refusing statins  . Diarrhea     off and on X 6 years  . Adenomatous colon polyp   . IBS (irritable bowel syndrome)   . UTI (urinary tract infection)   . 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 Anemia   . Blood transfusion   . Osteoporosis   . Congenital absence of one kidney   . Chronic anticoagulation 03/21/2013    Medications:  Prescriptions prior to admission  Medication Sig Dispense Refill  . acetaminophen (TYLENOL) 500 MG tablet Take 500 mg by mouth as needed. For headache       . ciprofloxacin (CIPRO) 250 MG tablet Take 1 tablet (250 mg total) by mouth 2 (two) times daily.  14 tablet  0  . enoxaparin (LOVENOX) 100 MG/ML injection Inject 100 mg into the skin daily. Daily for 4 nights. Completed on 01/16/14      . fish oil-omega-3 fatty acids 1000 MG capsule Take 2 g by mouth daily.       . fluticasone (FLONASE) 50 MCG/ACT nasal spray Place 2 sprays into both nostrils daily.      Marland Kitchen losartan (COZAAR) 100 MG tablet Take 50 mg by mouth daily.      . Probiotic Product (ALIGN) 4 MG CAPS Take by mouth daily.        . traMADol (ULTRAM) 50 MG tablet Take  50-100 mg by mouth every 12 (twelve) hours as needed for moderate pain.      . valACYclovir (VALTREX) 500 MG tablet Take 2 tablets (1,000 mg total) by mouth 2 (two) times daily.  120 tablet  6  . warfarin (COUMADIN) 5 MG tablet Take 7.5 mg by mouth daily.      Marland Kitchen zolpidem (AMBIEN) 10 MG tablet Take 10 mg by mouth at bedtime as needed for sleep.      Marland Kitchen acyclovir ointment (ZOVIRAX) 5 % Apply 1 application topically daily as needed (break out).      Marland Kitchen diphenoxylate-atropine (LOMOTIL) 2.5-0.025 MG per tablet Take 1 tablet by mouth 2 (two) times daily as needed for diarrhea or loose stools.       Marland Kitchen EPINEPHrine (EPIPEN) 0.3 mg/0.3 mL IJ SOAJ injection As dirrected  1 Device  3  . fexofenadine (ALLEGRA) 180 MG tablet Take 180 mg by mouth daily as needed for allergies.       Marland Kitchen loperamide (IMODIUM A-D) 2 MG tablet Take 2 mg by mouth 4 (four) times daily as needed for diarrhea or loose stools. Will take one by mouth 32min before meals or two as needed.      . triamcinolone ointment (KENALOG) 0.5 % Apply 1 application topically 3 (three) times daily.  30 g  1   Assessment: 80 year old woman s/p back surgery today to receive vancomycin post op for surgical prophylaxis with a drain in place.  Vancomycin 1g was given today at 1200  Goal of Therapy:  Vancomycin trough level 10-15 mcg/ml  Plan:  Vancomycin 1250mg  IV q24 - next dose tomorrow morning.   Monitor renal function.  Candie Mile 01/18/2014,5:24 PM

## 2014-01-18 NOTE — Op Note (Signed)
Brief history: The patient is a 80 year old white female who has complained of neck, shoulder, and arm pain consistent with a cervical radiculopathy. She has failed medical management and was worked up with a cervical MRI. This demonstrated multilevel spondylosis most prominent at C4-5, C5-6 and C6-7. I discussed the various treatment options with the patient including surgery. She has weighed the risks, benefits, and alternatives surgery and decided proceed with a C4-5, C5-6 and C6-7 anterior cervical discectomy, fusion, and plating.  Preoperative diagnosis: C4-5, C5-6 and C6-7 disc degeneration, spondylosis, stenosis, cervical radiculopathy, cervicalgia  Postoperative diagnosis: The same  Procedure: C4-5, C5-6 and C6-7 Anterior cervical discectomy/decompression; C4-5, C5-6 and C6-7 interbody arthrodesis with local morcellized autograft bone and Actifuse bone graft extender; insertion of interbody prosthesis at C4-5, C5-6 and C6-7 (Zimmer peek interbody prosthesis); anterior cervical plating from C4-C7 with globus titanium plate  Surgeon: Dr. Earle Gell  Asst.: Dr. Dominica Severin cram  Anesthesia: Gen. endotracheal  Estimated blood loss: 125 cc  Drains: One 10 mm flat Jackson-Pratt drain in the prevertebral space.  Complications: None  Description of procedure: The patient was brought to the operating room by the anesthesia team. General endotracheal anesthesia was induced. A roll was placed under the patient's shoulders to keep the neck in the neutral position. The patient's anterior cervical region was then prepared with Betadine scrub and Betadine solution. Sterile drapes were applied.  The area to be incised was then injected with Marcaine with epinephrine solution. I then used a scalpel to make a transverse incision in the patient's left anterior neck. I used the Metzenbaum scissors to divide the platysmal muscle and then to dissect medial to the sternocleidomastoid muscle, jugular vein, and  carotid artery. I carefully dissected down towards the anterior cervical spine identifying the esophagus and retracting it medially. Then using Kitner swabs to clear soft tissue from the anterior cervical spine. We then inserted a bent spinal needle into the upper exposed intervertebral disc space. We then obtained intraoperative radiographs confirm our location.  I then used electrocautery to detach the medial border of the longus colli muscle bilaterally from the C4-5, C5-6 and C6-7 intervertebral disc spaces. I then inserted the Caspar self-retaining retractor underneath the longus colli muscle bilaterally to provide exposure.  We then incised the intervertebral disc at C4-5. We then performed a partial intervertebral discectomy with a pituitary forceps and the Karlin curettes. I then inserted distraction screws into the vertebral bodies at C4-5. We then distracted the interspace. We then used the high-speed drill to decorticate the vertebral endplates at D2-2, to drill away the remainder of the intervertebral disc, to drill away some posterior spondylosis, and to thin out the posterior longitudinal ligament. I then incised ligament with the arachnoid knife. We then removed the ligament with a Kerrison punches undercutting the vertebral endplates and decompressing the thecal sac. We then performed foraminotomies about the bilateral C5 nerve roots. This completed the decompression at this level.  I then repeated this procedure in an analogous fashion at C5-6 and C6-7 decompressing the thecal sac and the bilateral C6 and C7 nerve roots.  We now turned our to attention to the interbody fusion. We used the trial spacers to determine the appropriate size for the interbody prosthesis. We then pre-filled prosthesis with a combination of local morcellized autograft bone that we obtained during decompression as well as Actifuse bone graft extender. We then inserted the prosthesis into the distracted interspace at  C4-5, C5-6 and C6-7. We then removed the distraction screws.  There was a good snug fit of the prosthesis in the interspace.  Having completed the fusion we now turned attention to the anterior spinal instrumentation. We used the high-speed drill to drill away some anterior spondylosis at the disc spaces so that the plate lay down flat. We selected the appropriate length titanium anterior cervical plate. We laid it along the anterior aspect of the vertebral bodies from C4-C7.  We then secured the plate to the vertebral bodies by placing two 12 mm self-drilling screws at C4, C5, C6 and C7. We then obtained intraoperative radiograph. The demonstrating good position of the instrumentation. We therefore secured the screws the plate the locking each cam. This completed the instrumentation.  We then obtained hemostasis using bipolar electrocautery. We irrigated the wound out with bacitracin solution. We then removed the retractor. We inspected the esophagus for any damage. There was none apparent. We then placed a 10 mm flat Jackson-Pratt drain in the prevertebral space. We tunneled it out through a separate stab wound. We then reapproximated patient's platysmal muscle with interrupted 3-0 Vicryl suture. We then reapproximated the subcutaneous tissue with interrupted 3-0 Vicryl suture. The skin was reapproximated with Steri-Strips and benzoin. The wound was then covered with bacitracin ointment. A sterile dressing was applied. The drapes were removed. Patient was subsequently extubated by the anesthesia team and transported to the post anesthesia care unit in stable condition. All sponge instrument and needle counts were reportedly correct at the end of this case.

## 2014-01-18 NOTE — Progress Notes (Signed)
Subjective:  The patient is somnolent but easily arousable. She is in no apparent distress. She looks well.  Objective: Vital signs in last 24 hours: Temp:  [98 F (36.7 C)-99.1 F (37.3 C)] 99.1 F (37.3 C) (08/05 1515) Pulse Rate:  [68] 68 (08/05 0903) Resp:  [18] 18 (08/05 0903) BP: (160)/(77) 160/77 mmHg (08/05 0903) SpO2:  [100 %] 100 % (08/05 0903) Weight:  [64.411 kg (142 lb)] 64.411 kg (142 lb) (08/05 0903)  Intake/Output from previous day:   Intake/Output this shift: Total I/O In: 1750 [I.V.:1500; IV Piggyback:250] Out: 300 [Blood:300]  Physical exam the patient is somnolent but arousable. She is moving all 4 extremities well. Her dressing is clean and dry. There is no evidence of hematoma or shift.  Lab Results: No results found for this basename: WBC, HGB, HCT, PLT,  in the last 72 hours BMET No results found for this basename: NA, K, CL, CO2, GLUCOSE, BUN, CREATININE, CALCIUM,  in the last 72 hours  Studies/Results: No results found.  Assessment/Plan: The patient is doing well.  LOS: 0 days     Pema Thomure D 01/18/2014, 3:30 PM

## 2014-01-19 ENCOUNTER — Encounter (HOSPITAL_COMMUNITY): Payer: Self-pay | Admitting: Neurosurgery

## 2014-01-19 DIAGNOSIS — M5030 Other cervical disc degeneration, unspecified cervical region: Secondary | ICD-10-CM | POA: Diagnosis not present

## 2014-01-19 NOTE — Progress Notes (Signed)
Patient ID: Tracy Peters, female   DOB: 1934/06/07, 80 y.o.   MRN: [id number] Subjective:  The patient is alert and pleasant. She is in no apparent distress. She has some dysphagia. She wants to stay until tomorrow.  Objective: Vital signs in last 24 hours: Temp:  [97.6 F (36.4 C)-99.1 F (37.3 C)] 98.5 F (36.9 C) (08/06 0751) Pulse Rate:  [68-100] 75 (08/06 0751) Resp:  [9-18] 18 (08/06 0751) BP: (106-160)/(63-85) 106/64 mmHg (08/06 0751) SpO2:  [93 %-100 %] 95 % (08/06 0751) Weight:  [64.411 kg (142 lb)] 64.411 kg (142 lb) (08/05 0903)  Intake/Output from previous day: 08/05 0701 - 08/06 0700 In: 2350 [P.O.:600; I.V.:1500; IV Piggyback:250] Out: 350 [Drains:50; Blood:300] Intake/Output this shift:    Physical exam the patient is alert and oriented. Her strength is normal in her bilateral deltoid, biceps, triceps and lower extremities. Her dressing is clean and dry. There is no evidence of hematoma or shift. I removed the drain.  Lab Results: No results found for this basename: WBC, HGB, HCT, PLT,  in the last 72 hours BMET No results found for this basename: NA, K, CL, CO2, GLUCOSE, BUN, CREATININE, CALCIUM,  in the last 72 hours  Studies/Results: Dg Cervical Spine 2-3 Views  01/18/2014   CLINICAL DATA:  Anterior cervical spine fusion  EXAM: CERVICAL SPINE - 2-3 VIEW  COMPARISON:  MR C-spine of 10/12/2013  FINDINGS: A cross-table lateral portable view from the operating room labeled #1 shows a needle positioned anteriorly localizing the C3-4 interspace.  A second cross-table lateral view labeled #2 shows anterior fusion from C4-C7 with normal alignment.  IMPRESSION: Anterior fusion from C4-C7 on the last film with normal alignment.   Electronically Signed   By: Ivar Drape M.D.   On: 01/18/2014 15:34    Assessment/Plan: Postop day 1: The patient is doing well. We will mobilize her. She will likely go home tomorrow.  LOS: 1 day     Sarah Baez D 01/19/2014, 7:52  AM

## 2014-01-19 NOTE — Evaluation (Signed)
Physical Therapy Evaluation Patient Details Name: Tracy Peters MRN: [id number] DOB: 11/19/1933 Today's Date: 01/19/2014   History of Present Illness  Pt s/p ACDF C4-C7. PMH- surgery x 4 rt knee.  Clinical Impression  Pt admitted with above. Pt currently with functional limitations due to the deficits listed below (see PT Problem List).  Pt will benefit from skilled PT to increase their independence and safety with mobility to allow discharge to the venue listed below. Pt should make rapid progress and be able to return home with husband.      Follow Up Recommendations No PT follow up    Equipment Recommendations  None recommended by PT    Recommendations for Other Services       Precautions / Restrictions Precautions Precautions: Cervical Required Braces or Orthoses: Cervical Brace Cervical Brace: Hard collar;At all times Restrictions Weight Bearing Restrictions: No      Mobility  Bed Mobility Overal bed mobility: Modified Independent                Transfers Overall transfer level: Needs assistance Equipment used: 1 person hand held assist Transfers: Sit to/from Stand Sit to Stand: Min assist         General transfer comment: Hand held assist for balance  Ambulation/Gait Ambulation/Gait assistance: Min assist Ambulation Distance (Feet): 350 Feet Assistive device: 1 person hand held assist Gait Pattern/deviations: Step-through pattern;Decreased stride length;Narrow base of support Gait velocity: decr Gait velocity interpretation: Below normal speed for age/gender General Gait Details: Slighty unsteady requiring hand held assist for balance.  Stairs Stairs: Yes Stairs assistance: Min assist Stair Management: One rail Right;Step to pattern;Forwards Number of Stairs: 3    Wheelchair Mobility    Modified Rankin (Stroke Patients Only)       Balance Overall balance assessment: Needs assistance Sitting-balance support: No upper extremity  supported Sitting balance-Leahy Scale: Good     Standing balance support: No upper extremity supported Standing balance-Leahy Scale: Fair Standing balance comment: Assist for dynamic balance                             Pertinent Vitals/Pain Neck pain.    Home Living Family/patient expects to be discharged to:: Private residence Living Arrangements: Spouse/significant other Available Help at Discharge: Family Type of Home: House Home Access: Stairs to enter Entrance Stairs-Rails: Right Entrance Stairs-Number of Steps: 3 Home Layout: One level Home Equipment: None      Prior Function Level of Independence: Independent               Hand Dominance   Dominant Hand: Right    Extremity/Trunk Assessment   Upper Extremity Assessment: RUE deficits/detail;LUE deficits/detail RUE Deficits / Details: Decr strength   RUE Sensation: decreased light touch;decreased proprioception               Communication   Communication: No difficulties  Cognition Arousal/Alertness: Awake/alert Behavior During Therapy: WFL for tasks assessed/performed Overall Cognitive Status: Within Functional Limits for tasks assessed                      General Comments      Exercises        Assessment/Plan    PT Assessment Patient needs continued PT services  PT Diagnosis Difficulty walking   PT Problem List Decreased balance;Decreased mobility;Decreased knowledge of precautions  PT Treatment Interventions DME instruction;Gait training;Functional mobility training;Balance training;Patient/family education   PT Goals (Current goals  can be found in the Care Plan section) Acute Rehab PT Goals Patient Stated Goal: Return home PT Goal Formulation: With patient Time For Goal Achievement: 01/21/14 Potential to Achieve Goals: Good    Frequency Min 5X/week   Barriers to discharge        Co-evaluation               End of Session Equipment Utilized  During Treatment: Cervical collar Activity Tolerance: Patient tolerated treatment well Patient left: in bed;with call bell/phone within reach;with family/visitor present Nurse Communication: Mobility status         Time: 4098-1191 PT Time Calculation (min): 20 min   Charges:   PT Evaluation $Initial PT Evaluation Tier I: 1 Procedure PT Treatments $Gait Training: 8-22 mins   PT G Codes:          Ardella Chhim 02/16/14, 11:13 AM  [employer] PT [phone]

## 2014-01-20 DIAGNOSIS — M5030 Other cervical disc degeneration, unspecified cervical region: Secondary | ICD-10-CM | POA: Diagnosis not present

## 2014-01-20 MED ORDER — OXYCODONE-ACETAMINOPHEN 5-325 MG PO TABS: 1.0000 | ORAL_TABLET | ORAL | Status: DC | PRN

## 2014-01-20 MED ORDER — DSS 100 MG PO CAPS: 100.0000 mg | ORAL_CAPSULE | Freq: Two times a day (BID) | ORAL | Status: DC

## 2014-01-20 NOTE — Progress Notes (Signed)
PT Late Entry G-code   01/19/14 1115  PT G-Codes **NOT FOR INPATIENT CLASS**  Functional Assessment Tool Used clinical judgement  Functional Limitation Mobility: Walking and moving around  Mobility: Walking and Moving Around Current Status [phone]) CI  Mobility: Walking and Moving Around Goal Status [phone]) [place]

## 2014-01-20 NOTE — Discharge Summary (Signed)
Physician Discharge Summary  Patient ID: Tracy Peters MRN: [id number] DOB/AGE: [birth year] 80 y.o.  Admit date: 01/18/2014 Discharge date: 01/20/2014  Admission Diagnoses: C4-5, C5-6 and C6-7 disc degeneration, spondylosis, stenosis, cervicalgia, cervical radiculopathy  Discharge Diagnoses: The same Active Problems:   Cervical spondylosis with myelopathy and radiculopathy   Discharged Condition: good  Hospital Course: I performed a C4-5, C5-6 and C6-7 anterior cervical discectomy, fusion, and plating on the patient on 01/18/14. The surgery went well.  The patient's postoperative course was unremarkable. On postoperative day #2 the patient requested discharge to home. The patient, and her husband, were given oral and written discharge instructions. All her questions were answered.  As per Dr. Beverely Peters recommendations the patient was resumed on her Coumadin and Lovenox.  Consults: None Significant Diagnostic Studies: None Treatments: C4-5, C5-6 and C6-7 anterior cervical discectomy, fusion, and plating. Discharge Exam: Blood pressure 151/83, pulse 74, temperature 97.7 F (36.5 C), temperature source Oral, resp. rate 16, weight 64.411 kg (142 lb), SpO2 96.00%. The patient is alert and pleasant. She looks well. Her dressing is clean and dry. There is no evidence of hematoma or shift. Her strength is normal in all 4 extremities.  Disposition: Home  Discharge Instructions   Call MD for:  difficulty breathing, headache or visual disturbances    Complete by:  As directed      Call MD for:  extreme fatigue    Complete by:  As directed      Call MD for:  hives    Complete by:  As directed      Call MD for:  persistant dizziness or light-headedness    Complete by:  As directed      Call MD for:  persistant nausea and vomiting    Complete by:  As directed      Call MD for:  redness, tenderness, or signs of infection (pain, swelling, redness, odor or green/yellow discharge around  incision site)    Complete by:  As directed      Call MD for:  severe uncontrolled pain    Complete by:  As directed      Call MD for:  temperature >100.4    Complete by:  As directed      Diet - Peters sodium heart healthy    Complete by:  As directed      Discharge instructions    Complete by:  As directed   Call [phone] for a followup appointment. Take a stool softener while you are using pain medications.     Driving Restrictions    Complete by:  As directed   Do not drive for 2 weeks.     Increase activity slowly    Complete by:  As directed      Lifting restrictions    Complete by:  As directed   Do not lift more than 5 pounds. No excessive bending or twisting.     May shower / Bathe    Complete by:  As directed   He may shower after the pain she is removed 3 days after surgery. Leave the incision alone.     Remove dressing in 24 hours    Complete by:  As directed             Medication List    STOP taking these medications       TYLENOL 500 MG tablet  Generic drug:  acetaminophen      TAKE these medications  acyclovir ointment 5 %  Commonly known as:  ZOVIRAX  Apply 1 application topically daily as needed (break out).     ALIGN 4 MG Caps  Take by mouth daily.     ciprofloxacin 250 MG tablet  Commonly known as:  CIPRO  Take 1 tablet (250 mg total) by mouth 2 (two) times daily.     diphenoxylate-atropine 2.5-0.025 MG per tablet  Commonly known as:  LOMOTIL  Take 1 tablet by mouth 2 (two) times daily as needed for diarrhea or loose stools.     DSS 100 MG Caps  Take 100 mg by mouth 2 (two) times daily.     enoxaparin 100 MG/ML injection  Commonly known as:  LOVENOX  Inject 100 mg into the skin daily. Daily for 4 nights. Completed on 01/16/14     EPINEPHrine 0.3 mg/0.3 mL Soaj injection  Commonly known as:  EPIPEN  As dirrected     fexofenadine 180 MG tablet  Commonly known as:  ALLEGRA  Take 180 mg by mouth daily as needed for allergies.      fish oil-omega-3 fatty acids 1000 MG capsule  Take 2 g by mouth daily.     fluticasone 50 MCG/ACT nasal spray  Commonly known as:  FLONASE  Place 2 sprays into both nostrils daily.     loperamide 2 MG tablet  Commonly known as:  IMODIUM A-D  Take 2 mg by mouth 4 (four) times daily as needed for diarrhea or loose stools. Will take one by mouth 67min before meals or two as needed.     losartan 100 MG tablet  Commonly known as:  COZAAR  Take 50 mg by mouth daily.     oxyCODONE-acetaminophen 5-325 MG per tablet  Commonly known as:  PERCOCET/ROXICET  Take 1-2 tablets by mouth every 4 (four) hours as needed for moderate pain.     traMADol 50 MG tablet  Commonly known as:  ULTRAM  Take 50-100 mg by mouth every 12 (twelve) hours as needed for moderate pain.     triamcinolone ointment 0.5 %  Commonly known as:  KENALOG  Apply 1 application topically 3 (three) times daily.     valACYclovir 500 MG tablet  Commonly known as:  VALTREX  Take 2 tablets (1,000 mg total) by mouth 2 (two) times daily.     warfarin 5 MG tablet  Commonly known as:  COUMADIN  Take 7.5 mg by mouth daily.     zolpidem 10 MG tablet  Commonly known as:  AMBIEN  Take 10 mg by mouth at bedtime as needed for sleep.         SignedNewman Pies D 01/20/2014, 11:04 AM

## 2014-01-20 NOTE — Progress Notes (Signed)
Physical Therapy Treatment Patient Details Name: Tracy Peters MRN: [id number] DOB: 04/27/1934 Today's Date: 01/20/2014    History of Present Illness Pt s/p ACDF C4-C7. PMH- surgery x 4 rt knee.    PT Comments    Pt initially unsteady with gait due to Lt knee arthritis ("I always start off like this. I'm just very careful"). Gait improves after ~30 feet. Reports her husband will be with her and does not want to use an assistive device. Overall, moving well and ready to d/c home from a PT standpoint.   Follow Up Recommendations  No PT follow up     Equipment Recommendations  None recommended by PT (pt refusing)    Recommendations for Other Services       Precautions / Restrictions Precautions Precautions: Cervical Precaution Comments: pt able to state precautions Required Braces or Orthoses: Cervical Brace Cervical Brace: Hard collar;At all times    Mobility  Bed Mobility Overal bed mobility: Needs Assistance Bed Mobility: Rolling;Sidelying to Sit Rolling: Supervision Sidelying to sit: Supervision       General bed mobility comments: vc for log-rolling and side to sit; pt attempting to pull up from supine (HOB 0)  Transfers Overall transfer level: Needs assistance Equipment used: 1 person hand held assist Transfers: Sit to/from Stand Sit to Stand: Min assist         General transfer comment: Hand held assist for balance; pt has severe arthritis Lt knee and "takes a few steps to work it out"  Ambulation/Gait Ambulation/Gait assistance: Min assist;Min guard Ambulation Distance (Feet): 400 Feet Assistive device: 1 person hand held assist Gait Pattern/deviations: Step-through pattern;Decreased stride length Gait velocity: decr   General Gait Details: Slighty unsteady requiring hand held assist for balance over initial 30 feet--pt reports due to knee OA. Refuses use of cane or RW   Stairs Stairs:  (discussed steps up to bed; rec tall back chair for UE  suppor)          Wheelchair Mobility    Modified Rankin (Stroke Patients Only)       Balance                                    Cognition Arousal/Alertness: Awake/alert Behavior During Therapy: WFL for tasks assessed/performed Overall Cognitive Status: Within Functional Limits for tasks assessed                      Exercises      General Comments General comments (skin integrity, edema, etc.): Educated pt on doing mid-range LAQ prior to standing to "prime" knee. Educated on not using furniture or walls for support as reaching for them will cause neck strain and potential fall if she misses the support. Reports she will have her husband walk with her HHA as she gets up intially.       Pertinent Vitals/Pain Pain Assessment: 0-10 Pain Score: 4  Pain Location: neck Pain Descriptors / Indicators: Aching Pain Intervention(s): Monitored during session;Repositioned    Home Living                      Prior Function            PT Goals (current goals can now be found in the care plan section) Acute Rehab PT Goals Patient Stated Goal: Return home Progress towards PT goals: Progressing toward goals    Frequency  Min 5X/week    PT Plan Current plan remains appropriate    Co-evaluation             End of Session Equipment Utilized During Treatment: Cervical collar Activity Tolerance: Patient tolerated treatment well Patient left: in bed;with call bell/phone within reach;with family/visitor present     Time: 0922-0957 PT Time Calculation (min): 35 min  Charges:  $Gait Training: 8-22 mins $Self Care/Home Management: 8-22                    G Codes:      Tracy Peters 2014/02/14, 10:13 AM Pager [phone]

## 2014-01-23 ENCOUNTER — Ambulatory Visit (HOSPITAL_BASED_OUTPATIENT_CLINIC_OR_DEPARTMENT_OTHER): Payer: 59

## 2014-01-23 ENCOUNTER — Ambulatory Visit (HOSPITAL_BASED_OUTPATIENT_CLINIC_OR_DEPARTMENT_OTHER): Payer: Self-pay | Admitting: Pharmacist

## 2014-01-23 ENCOUNTER — Other Ambulatory Visit: Payer: 59

## 2014-01-23 DIAGNOSIS — I749 Embolism and thrombosis of unspecified artery: Secondary | ICD-10-CM

## 2014-01-23 DIAGNOSIS — I742 Embolism and thrombosis of arteries of the upper extremities: Secondary | ICD-10-CM

## 2014-01-23 LAB — PROTIME-INR
INR: 1.3 — AB (ref 2.00–3.50)
PROTIME: 15.6 s — AB (ref 10.6–13.4)

## 2014-01-23 LAB — POCT INR: INR: 1.3

## 2014-01-23 NOTE — Patient Instructions (Signed)
Continue with Coumadin 7.5 mg daily.   Continue Lovenox 100mg  daily.   Recheck INR on 01/27/14; lab at 11:30am and coumadin clinic at 11:45am.

## 2014-01-23 NOTE — Progress Notes (Signed)
INR below goal today. Coumadin resumed on 01/19/14. Lovenox 100mg  daily started on 01/20/14. Pt held coumadin as instructed. The only new medication is percocet for pain. Pt was constipated and used an enema today with results. We discussed bowel regimen medications to use to avoid severe constipation again.  Pt stated understanding. She is eating less (amounts) of food but still eating the same types of foods. No recent greens. No additional missed or extra coumadin doses. No unusual bruising (usual bruising on abdomen from Lovenox injections). No bleeding noted. No s/s of clotting noted. Continue with Coumadin 7.5 mg daily.   Continue Lovenox 100mg  daily.   Recheck INR on 01/27/14; lab at 11:30am and coumadin clinic at 11:45am.

## 2014-01-26 ENCOUNTER — Telehealth: Payer: Self-pay | Admitting: Oncology

## 2014-01-26 NOTE — Telephone Encounter (Signed)
per Gerald Stabs to sch [facility] & lab-pt aware

## 2014-01-27 ENCOUNTER — Ambulatory Visit: Payer: 59 | Admitting: Pharmacist

## 2014-01-27 ENCOUNTER — Other Ambulatory Visit (HOSPITAL_BASED_OUTPATIENT_CLINIC_OR_DEPARTMENT_OTHER): Payer: 59

## 2014-01-27 DIAGNOSIS — I742 Embolism and thrombosis of arteries of the upper extremities: Secondary | ICD-10-CM

## 2014-01-27 DIAGNOSIS — Z7901 Long term (current) use of anticoagulants: Secondary | ICD-10-CM

## 2014-01-27 LAB — PROTIME-INR
INR: 1.5 — ABNORMAL LOW (ref 2.00–3.50)
Protime: 18 Seconds — ABNORMAL HIGH (ref 10.6–13.4)

## 2014-01-27 LAB — POCT INR: INR: 1.5

## 2014-01-27 NOTE — Progress Notes (Signed)
INR still below goal today Pt continues on lovenox 100 mg  Pt is doing well after procedure She has bruising from lovenox injection sites No other bruising or bleeding noted No missed or extra doses No other medication or diet changes Pt has an appointment follow up with Dr. Beryle Beams on Monday 01/30/14 Plan: Take coumadin 10 mg on Friday, 7.5 mg on Saturday, and 10 mg on Sunday  Continue Lovenox 100mg  daily then recheck INR at Dr. Azucena Freed office on 8/17 at 9:30am (I called internal medicine clinic to make them aware of INR check on 01/30/14. Order for INR is in and internal medicine clinic aware of INR draw at Dr. Beryle Beams visit)

## 2014-01-27 NOTE — Patient Instructions (Signed)
INR still below goal Take coumadin 10 mg on Friday, 7.5 mg on Saturday, and 10 mg on Sunday then recheck INR at Dr. Azucena Freed office on 8/17 at 9:30am  Continue Lovenox 100mg  daily.

## 2014-01-30 ENCOUNTER — Ambulatory Visit (INDEPENDENT_AMBULATORY_CARE_PROVIDER_SITE_OTHER): Payer: 59 | Admitting: Oncology

## 2014-01-30 ENCOUNTER — Encounter: Payer: Self-pay | Admitting: Oncology

## 2014-01-30 ENCOUNTER — Other Ambulatory Visit: Payer: Self-pay | Admitting: Oncology

## 2014-01-30 VITALS — BP 188/97 | HR 77 | Temp 98.3°F | Ht 65.0 in | Wt 141.7 lb

## 2014-01-30 DIAGNOSIS — Z7901 Long term (current) use of anticoagulants: Secondary | ICD-10-CM

## 2014-01-30 DIAGNOSIS — I742 Embolism and thrombosis of arteries of the upper extremities: Secondary | ICD-10-CM | POA: Diagnosis not present

## 2014-01-30 DIAGNOSIS — Z01818 Encounter for other preprocedural examination: Secondary | ICD-10-CM | POA: Diagnosis not present

## 2014-01-30 LAB — CBC WITH DIFFERENTIAL/PLATELET
Basophils Absolute: 0.1 10*3/uL (ref 0.0–0.1)
Basophils Relative: 1 % (ref 0–1)
EOS PCT: 3 % (ref 0–5)
Eosinophils Absolute: 0.2 10*3/uL (ref 0.0–0.7)
HEMATOCRIT: 35.5 % — AB (ref 36.0–46.0)
Hemoglobin: 11.8 g/dL — ABNORMAL LOW (ref 12.0–15.0)
LYMPHS ABS: 2.5 10*3/uL (ref 0.7–4.0)
Lymphocytes Relative: 31 % (ref 12–46)
MCH: 36.6 pg — ABNORMAL HIGH (ref 26.0–34.0)
MCHC: 33.2 g/dL (ref 30.0–36.0)
MCV: 110.2 fL — AB (ref 78.0–100.0)
Monocytes Absolute: 0.7 10*3/uL (ref 0.1–1.0)
Monocytes Relative: 9 % (ref 3–12)
NEUTROS ABS: 4.5 10*3/uL (ref 1.7–7.7)
Neutrophils Relative %: 56 % (ref 43–77)
Platelets: 392 10*3/uL (ref 150–400)
RBC: 3.22 MIL/uL — AB (ref 3.87–5.11)
RDW: 12.5 % (ref 11.5–15.5)
WBC: 8.1 10*3/uL (ref 4.0–10.5)

## 2014-01-30 LAB — PROTIME-INR
INR: 1.78 — AB (ref <1.50)
Prothrombin Time: 20.7 seconds — ABNORMAL HIGH (ref 11.6–15.2)

## 2014-01-30 NOTE — Patient Instructions (Signed)
Continue lovenox for now Continue current dose of coumadin 7.5 mg daily except 10 mg on Friday and Sunday until further instruction Repeat PT/INR at cancer center this Wednesday 8/19 Follow up visit with Dr Darnell Level: 10:45 on 08/01/14

## 2014-01-31 ENCOUNTER — Ambulatory Visit: Payer: 59 | Admitting: Pharmacist

## 2014-01-31 LAB — POCT INR: INR: 1.78

## 2014-01-31 NOTE — Patient Instructions (Signed)
Per Dr. Beryle Beams instruction: Continue Lovenox 100mg  daily. Continue coumadin 7.5 mg daily. Return on Wednesday 02/01/14 for INR check at the coumadin clinic at 11am for lab and 11:15am for coumadin clinic

## 2014-01-31 NOTE — Progress Notes (Signed)
*  Telephone encounter - No charge* INR remains below goal but trending upwards after 2 doses of 10 mg over the weekend Pt seen by Dr. Beryle Beams for follow up on 01/30/14 and INR was drawn at his office Pt doing well after procedure No changes to meds/diet No missed or extra doses No unusual bruising or bleeding Per Dr. Beryle Beams instruction:  Continue Lovenox 100mg  daily. (pt has ~ 4 injections left at home) Continue coumadin 7.5 mg daily.  Return on Wednesday 02/01/14 for INR check at the coumadin clinic at 11am for lab and 11:15am for coumadin clinic (appointments scheduled in EPIC)

## 2014-01-31 NOTE — Progress Notes (Signed)
Patient ID: Tracy Peters, female   DOB: 1934/03/27, 80 y.o.   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 She has been maintained on chronic Coumadin anticoagulation. The please see my progress note 01/10/2014 for additional details. She just had successful surgery on her cervical spine by Dr. Newman Pies on August 5. Anticoagulation was held for the procedure and she is currently on a Lovenox bridge and back on her Coumadin. Surgery was successful. She still has some incisional pain but no peptic pain has decreased and paresthesias have decreased significantly. Still some residual paresthesias in some of her fingers of the right hand.  On my exam today, she is still hesitant but has 4+ over 5 motor strength all muscle groups right upper extremity. Reflex 1+ at the biceps.  Lungs are clear. Regular cardiac rhythm no murmur. Radial pulses 1+ symmetric. Extremities no edema no calf tenderness  Impression: #1. Stable 2 weeks postop C4-5, 5-6, 6-7, anterior cervical discectomy, fusion, and plating.  #2. 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 INR today is 1.9. Coumadin 7.5 mg daily. She was instructed to take a 10 mg dose on August 14 when INR was 1.5 she took another 10 mg dose on August 16. I will keep her on the current 7.5 mg daily, 10 mg Friday and 10 mg Sunday, and not give any additional doses until we see the full effects of the 2  10 mg doses. We will check another PT/INR this Wednesday, August 19. She will remain on Lovenox until she is adequately anticoagulated on the warfarin.  I Discussed above with cancer center pharmacist who has been assisting him anticoagulation management.

## 2014-02-01 ENCOUNTER — Other Ambulatory Visit (HOSPITAL_BASED_OUTPATIENT_CLINIC_OR_DEPARTMENT_OTHER): Payer: 59

## 2014-02-01 ENCOUNTER — Ambulatory Visit (HOSPITAL_BASED_OUTPATIENT_CLINIC_OR_DEPARTMENT_OTHER): Payer: Medicare Other | Admitting: Pharmacist

## 2014-02-01 DIAGNOSIS — I742 Embolism and thrombosis of arteries of the upper extremities: Secondary | ICD-10-CM | POA: Diagnosis not present

## 2014-02-01 DIAGNOSIS — Z7901 Long term (current) use of anticoagulants: Secondary | ICD-10-CM

## 2014-02-01 LAB — POCT INR: INR: 2.7

## 2014-02-01 LAB — PROTIME-INR
INR: 2.7 (ref 2.00–3.50)
PROTIME: 32.4 s — AB (ref 10.6–13.4)

## 2014-02-01 NOTE — Patient Instructions (Signed)
INR at goal Stop Lovenox injections.  Continue coumadin 7.5 mg daily.  Return on Wednesday 03/07/14 for INR check at the coumadin clinic at 10:30am for lab and 10:45am for coumadin clinic

## 2014-02-01 NOTE — Progress Notes (Signed)
INR at goal Pt is doing well after procedure Some minor bruising from lovenox injection sites on stomach No other unusual bleeding or bruising No diet or medication changes No missed or extra doses Tracy Peters has been taking the lovenox/coumadin as instructed by coumadin clinic and Dr. Beryle Beams. Since INR now at goal after 2 days of 10 mg last week we will return to 7.5 mg daily Plan: Stop Lovenox injections.  Continue coumadin 7.5 mg daily.  Return on Wednesday 03/07/14 for INR check at the coumadin clinic at 10:30am for lab and 10:45am for coumadin clinic

## 2014-02-08 DIAGNOSIS — M175 Other unilateral secondary osteoarthritis of knee: Secondary | ICD-10-CM | POA: Diagnosis not present

## 2014-02-14 DIAGNOSIS — M542 Cervicalgia: Secondary | ICD-10-CM | POA: Diagnosis not present

## 2014-02-16 DIAGNOSIS — M1710 Unilateral primary osteoarthritis, unspecified knee: Secondary | ICD-10-CM | POA: Diagnosis not present

## 2014-02-22 DIAGNOSIS — M1710 Unilateral primary osteoarthritis, unspecified knee: Secondary | ICD-10-CM | POA: Diagnosis not present

## 2014-02-23 ENCOUNTER — Telehealth: Payer: Self-pay | Admitting: *Deleted

## 2014-02-23 NOTE — Telephone Encounter (Signed)
I did not recommend one Thx

## 2014-02-23 NOTE — Telephone Encounter (Signed)
Received fax from pharmacy stating pt  Informed them that we told her to take a muscle relaxant. I do not see any record of this. Please advise.

## 2014-02-24 NOTE — Telephone Encounter (Signed)
Left detailed mess informing pharmacy of below.  

## 2014-03-07 ENCOUNTER — Ambulatory Visit (HOSPITAL_BASED_OUTPATIENT_CLINIC_OR_DEPARTMENT_OTHER): Payer: Medicare Other | Admitting: Pharmacist

## 2014-03-07 ENCOUNTER — Other Ambulatory Visit (HOSPITAL_BASED_OUTPATIENT_CLINIC_OR_DEPARTMENT_OTHER): Payer: 59

## 2014-03-07 DIAGNOSIS — I749 Embolism and thrombosis of unspecified artery: Secondary | ICD-10-CM

## 2014-03-07 DIAGNOSIS — Z7901 Long term (current) use of anticoagulants: Secondary | ICD-10-CM

## 2014-03-07 LAB — PROTIME-INR
INR: 1.6 — ABNORMAL LOW (ref 2.00–3.50)
Protime: 19.2 Seconds — ABNORMAL HIGH (ref 10.6–13.4)

## 2014-03-07 LAB — POCT INR: INR: 1.6

## 2014-03-07 NOTE — Progress Notes (Signed)
INR below goal Pt is doing well today  She is improving since surgery earlier this summer. She is out of the neck brace now She is still taking it easy No missed or extra doses No unusual bleeding or bruising No diet or medication changes Unsure why INR low this visit as patient was therapeutic on this dose prior to surgery. Will make slight dose increase Plan: Take 10 mg today then increase to coumadin 7.5 mg daily except 10 mg on Tuesdays.  Return in 2 weeks on Tuesday 03/21/14 for INR check at the coumadin clinic at 10:30am for lab and 10:45am for coumadin clinic

## 2014-03-07 NOTE — Patient Instructions (Addendum)
INR below goal today Take 10 mg today then increase to coumadin 7.5 mg daily except 10 mg on Tuesdays.  Return on Tuesday 03/21/14 for INR check at the coumadin clinic at 10:30am for lab and 10:45am for coumadin clinic

## 2014-03-14 ENCOUNTER — Other Ambulatory Visit: Payer: Self-pay | Admitting: *Deleted

## 2014-03-14 MED ORDER — LOSARTAN POTASSIUM 100 MG PO TABS: 50.0000 mg | ORAL_TABLET | Freq: Every day | ORAL | Status: DC

## 2014-03-16 ENCOUNTER — Ambulatory Visit (INDEPENDENT_AMBULATORY_CARE_PROVIDER_SITE_OTHER): Payer: 59

## 2014-03-16 DIAGNOSIS — Z23 Encounter for immunization: Secondary | ICD-10-CM | POA: Diagnosis not present

## 2014-03-20 ENCOUNTER — Ambulatory Visit: Payer: 59 | Admitting: Oncology

## 2014-03-20 ENCOUNTER — Other Ambulatory Visit: Payer: 59

## 2014-03-21 ENCOUNTER — Ambulatory Visit (HOSPITAL_BASED_OUTPATIENT_CLINIC_OR_DEPARTMENT_OTHER): Payer: Self-pay | Admitting: Pharmacist

## 2014-03-21 ENCOUNTER — Other Ambulatory Visit (HOSPITAL_BASED_OUTPATIENT_CLINIC_OR_DEPARTMENT_OTHER): Payer: Medicare Other

## 2014-03-21 DIAGNOSIS — I742 Embolism and thrombosis of arteries of the upper extremities: Secondary | ICD-10-CM

## 2014-03-21 DIAGNOSIS — Z7901 Long term (current) use of anticoagulants: Secondary | ICD-10-CM

## 2014-03-21 LAB — CBC WITH DIFFERENTIAL/PLATELET
BASO%: 0.5 % (ref 0.0–2.0)
Basophils Absolute: 0 10*3/uL (ref 0.0–0.1)
EOS ABS: 0.1 10*3/uL (ref 0.0–0.5)
EOS%: 1.9 % (ref 0.0–7.0)
HEMATOCRIT: 38.7 % (ref 34.8–46.6)
HEMOGLOBIN: 12.7 g/dL (ref 11.6–15.9)
LYMPH#: 1.6 10*3/uL (ref 0.9–3.3)
LYMPH%: 23.9 % (ref 14.0–49.7)
MCH: 36.1 pg — ABNORMAL HIGH (ref 25.1–34.0)
MCHC: 32.7 g/dL (ref 31.5–36.0)
MCV: 110.4 fL — ABNORMAL HIGH (ref 79.5–101.0)
MONO#: 0.6 10*3/uL (ref 0.1–0.9)
MONO%: 8.9 % (ref 0.0–14.0)
NEUT%: 64.8 % (ref 38.4–76.8)
NEUTROS ABS: 4.3 10*3/uL (ref 1.5–6.5)
PLATELETS: 286 10*3/uL (ref 145–400)
RBC: 3.51 10*6/uL — ABNORMAL LOW (ref 3.70–5.45)
RDW: 13.3 % (ref 11.2–14.5)
WBC: 6.7 10*3/uL (ref 3.9–10.3)

## 2014-03-21 LAB — COMPREHENSIVE METABOLIC PANEL (CC13)
ALT: 15 U/L (ref 0–55)
AST: 22 U/L (ref 5–34)
Albumin: 3.5 g/dL (ref 3.5–5.0)
Alkaline Phosphatase: 51 U/L (ref 40–150)
Anion Gap: 6 mEq/L (ref 3–11)
BUN: 16 mg/dL (ref 7.0–26.0)
CALCIUM: 9.9 mg/dL (ref 8.4–10.4)
CHLORIDE: 99 meq/L (ref 98–109)
CO2: 26 meq/L (ref 22–29)
CREATININE: 0.9 mg/dL (ref 0.6–1.1)
Glucose: 78 mg/dl (ref 70–140)
Potassium: 4.9 mEq/L (ref 3.5–5.1)
Sodium: 132 mEq/L — ABNORMAL LOW (ref 136–145)
TOTAL PROTEIN: 7.3 g/dL (ref 6.4–8.3)
Total Bilirubin: 0.36 mg/dL (ref 0.20–1.20)

## 2014-03-21 LAB — PROTIME-INR
INR: 2.5 (ref 2.00–3.50)
Protime: 30 Seconds — ABNORMAL HIGH (ref 10.6–13.4)

## 2014-03-21 LAB — POCT INR: INR: 2.5

## 2014-03-21 NOTE — Progress Notes (Signed)
INR = 2.5   Goal 2-3 INR within goal range. No complications of anticoagulation noted. Tracy Peters has some bruising on legs, but states this is typical for her and no worse than usual. No medication or dietary changes. Take Coumadin 7.5 mg daily except 10 mg on Mondays. Return on Tuesday 04/25/14 for INR check at the coumadin clinic at 10:30am for lab and 10:45am for Coumadin clinic.  Theone Murdoch, PharmD

## 2014-04-20 ENCOUNTER — Emergency Department (INDEPENDENT_AMBULATORY_CARE_PROVIDER_SITE_OTHER)
Admission: EM | Admit: 2014-04-20 | Discharge: 2014-04-20 | Disposition: A | Discharge status: Home / Self Care | Payer: 59 | Source: Home / Self Care | Attending: Family Medicine | Admitting: Family Medicine

## 2014-04-20 ENCOUNTER — Encounter (HOSPITAL_COMMUNITY): Payer: Self-pay | Admitting: Family Medicine

## 2014-04-20 DIAGNOSIS — Z7901 Long term (current) use of anticoagulants: Secondary | ICD-10-CM

## 2014-04-20 DIAGNOSIS — I10 Essential (primary) hypertension: Secondary | ICD-10-CM

## 2014-04-20 NOTE — ED Notes (Signed)
Reports having high pressure readings at home with home monitor.  Taking Bp meds as prescribed.     Under a little stress lately.    States having some headache.  Recent neck surgery still recovering.  Denies blurred/spotty vision.

## 2014-04-20 NOTE — Discharge Instructions (Signed)
As you are well aware, you pressure it too high. This is more concerning on a months to years time frame as opposed to days to weeks.  Please increase your losartan to 100mg  daily. Please check your blood pressure daily and stop the medicine if you develop symptoms of low blood pressure.  Remember to discuss getting lab work to check your kidney function and self titration of your blood pressure medications.  Also consider discussing adding a second blood pressure medication to your regimen

## 2014-04-20 NOTE — ED Provider Notes (Signed)
CSN: [id number]     Arrival date & time 04/20/14  1350 History   First MD Initiated Contact with Patient 04/20/14 1422     Chief Complaint  Patient presents with  . Hypertension   (Consider location/radiation/quality/duration/timing/severity/associated sxs/prior Treatment) HPI  HTN: h/o HTN. Home BP readings for the past week ranging from 159-175/84-97. Pt under much more stress lately due to personal issues. Denies CP, palpitations, SOB, HA, syncope. Currently taking losartan 50mg . Previously on losartan 100mg  (decreased about 1 year as it was very low at the time). Pt w/ appt w/ Plotnikov on 05/03/14. Husband was concerned about the pressure being too high and wanted her seen prior to her appt on the 18th.    Past Medical History  Diagnosis Date  . LBP (low back pain)   . Hypertension   . Long term (current) use of anticoagulants     Dr. Beryle Beams  . History of DVT (deep vein thrombosis)   . GERD (gastroesophageal reflux disease)     mild  . Insomnia   . Herpes   . Allergic rhinitis   . Hyperlipidemia     refusing statins  . Diarrhea     off and on X 6 years  . Adenomatous colon polyp   . IBS (irritable bowel syndrome)   . UTI (urinary tract infection)   . 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 Anemia   . Blood transfusion   . Osteoporosis   . Congenital absence of one kidney   . Chronic anticoagulation 03/21/2013   Past Surgical History  Procedure Laterality Date  . Cholecystectomy    . Abdominal hysterectomy    . Appendectomy    . Breast surgery      cystectomy-benign  . Lumbar laminectomy      X 2  . Knee surgery      left x 3  . Shoulder surgery      Right  . Back surgery      2  . Anterior cervical decomp/discectomy fusion N/A 01/18/2014    Procedure: ANTERIOR CERVICAL DECOMPRESSION/DISCECTOMY FUSION 3 LEVELS  Cervical  four/five, five/six, six/seven anterior cervical decompression with fusion interbody  prosthesis with plating and bonegraft;  Surgeon: Newman Pies, MD;  Location: [facility];  Service: Neurosurgery;  Laterality: N/A;  t   Family History  Problem Relation Age of Onset  . Breast cancer Sister   . Kidney disease Sister 42    nephritis  . Heart disease Brother   . Osteoarthritis Other   . Parkinsonism Mother   . Ulcerative colitis Sister   . Colon cancer Neg Hx    History  Substance Use Topics  . Smoking status: Never Smoker   . Smokeless tobacco: Never Used  . Alcohol Use: No   OB History    No data available     Review of Systems Per HPI with all other pertinent systems negative.   Allergies  Bee venom; Ivp dye; Shellfish allergy; Amlodipine besylate; Aspirin; Atorvastatin; Cholestyramine; Codeine phosphate; Demerol; Diphenhydramine hcl; Doxycycline; Gentamicin sulfate; Meclizine hcl; Meperidine hcl; Metronidazole; Moxifloxacin; Sulfamethoxazole; and Penicillins  Home Medications   Prior to Admission medications   Medication Sig Start Date End Date Taking? Authorizing Provider  enoxaparin (LOVENOX) 100 MG/ML injection Inject 100 mg into the skin daily. Daily for 4 nights. Completed on 01/16/14   Yes Historical Provider, MD  fexofenadine (ALLEGRA) 180 MG tablet Take 180 mg by mouth daily  as needed for allergies.  05/28/11  Yes Aleksei Plotnikov V, MD  fish oil-omega-3 fatty acids 1000 MG capsule Take 2 g by mouth daily.    Yes Historical Provider, MD  fluticasone (FLONASE) 50 MCG/ACT nasal spray Place 2 sprays into both nostrils daily.   Yes Historical Provider, MD  losartan (COZAAR) 100 MG tablet Take 0.5 tablets (50 mg total) by mouth daily. 03/14/14  Yes Aleksei Plotnikov V, MD  Probiotic Product (ALIGN) 4 MG CAPS Take by mouth daily.     Yes Historical Provider, MD  warfarin (COUMADIN) 5 MG tablet Take 7.5 mg by mouth daily.   Yes Historical Provider, MD  zolpidem (AMBIEN) 10 MG tablet Take 10 mg by mouth at bedtime as needed for sleep.   Yes Historical  Provider, MD  acyclovir ointment (ZOVIRAX) 5 % Apply 1 application topically daily as needed (break out).    Historical Provider, MD  ciprofloxacin (CIPRO) 250 MG tablet Take 1 tablet (250 mg total) by mouth 2 (two) times daily. 12/28/13   Aleksei Plotnikov V, MD  diphenoxylate-atropine (LOMOTIL) 2.5-0.025 MG per tablet Take 1 tablet by mouth 2 (two) times daily as needed for diarrhea or loose stools.     Historical Provider, MD  docusate sodium 100 MG CAPS Take 100 mg by mouth 2 (two) times daily. 01/20/14   Newman Pies, MD  EPINEPHrine (EPIPEN) 0.3 mg/0.3 mL IJ SOAJ injection As dirrected 12/28/13   Aleksei Plotnikov V, MD  loperamide (IMODIUM A-D) 2 MG tablet Take 2 mg by mouth 4 (four) times daily as needed for diarrhea or loose stools. Will take one by mouth 24min before meals or two as needed.    Historical Provider, MD  oxyCODONE-acetaminophen (PERCOCET/ROXICET) 5-325 MG per tablet Take 1-2 tablets by mouth every 4 (four) hours as needed for moderate pain. 01/20/14   Newman Pies, MD  traMADol (ULTRAM) 50 MG tablet Take 50-100 mg by mouth every 12 (twelve) hours as needed for moderate pain.    Historical Provider, MD  triamcinolone ointment (KENALOG) 0.5 % Apply 1 application topically 3 (three) times daily. 06/29/13   Aleksei Plotnikov V, MD  valACYclovir (VALTREX) 500 MG tablet Take 2 tablets (1,000 mg total) by mouth 2 (two) times daily.    Aleksei Plotnikov V, MD   BP 177/105 mmHg  Pulse 78  Temp(Src) 98.1 F (36.7 C) (Oral)  Resp 12  SpO2 98% Physical Exam  Constitutional: She is oriented to person, place, and time. She appears well-developed and well-nourished.  HENT:  Head: Normocephalic and atraumatic.  Eyes: EOM are normal. Pupils are equal, round, and reactive to light.  Neck: Normal range of motion.  Cardiovascular: Normal rate.   Pulmonary/Chest: Effort normal and breath sounds normal.  Abdominal: Soft.  Musculoskeletal: Normal range of motion.  Neurological: She is  alert and oriented to person, place, and time. No cranial nerve deficit. Coordination normal.  Skin: Skin is warm and dry.  Psychiatric: She has a normal mood and affect. Her behavior is normal. Thought content normal.    ED Course  Procedures (including critical care time) Labs Review Labs Reviewed - No data to display  Imaging Review No results found.   MDM   1. Essential hypertension   2. Chronic anticoagulation   Pt w/ single kidney  Increase losartan to 100mg  Renal function nml Continue anticoagulation  F/u w/ PCP in 13 days for repeat labwork and discussion regarding HTN management (dual therapy vs maximizing losartan) Stop if symptomatic hypotension  Precautions  given and all questions answered   Linna Darner, MD Family Medicine 04/20/2014, 2:54 PM      Waldemar Dickens, MD 04/20/14 [phone]

## 2014-04-25 ENCOUNTER — Other Ambulatory Visit (HOSPITAL_BASED_OUTPATIENT_CLINIC_OR_DEPARTMENT_OTHER): Payer: 59

## 2014-04-25 ENCOUNTER — Ambulatory Visit (HOSPITAL_BASED_OUTPATIENT_CLINIC_OR_DEPARTMENT_OTHER): Payer: 59 | Admitting: Pharmacist

## 2014-04-25 DIAGNOSIS — I748 Embolism and thrombosis of other arteries: Secondary | ICD-10-CM

## 2014-04-25 DIAGNOSIS — Z7901 Long term (current) use of anticoagulants: Secondary | ICD-10-CM

## 2014-04-25 LAB — PROTIME-INR
INR: 2.2 (ref 2.00–3.50)
Protime: 26.4 Seconds — ABNORMAL HIGH (ref 10.6–13.4)

## 2014-04-25 LAB — POCT INR: INR: 2.2

## 2014-04-25 NOTE — Patient Instructions (Signed)
Continue Coumadin 7.5 mg daily except 10 mg on Mondays. Return on Tuesday 05/30/14 for INR check at the coumadin clinic at 10:30am for lab and 10:45am for Coumadin clinic

## 2014-04-25 NOTE — Progress Notes (Signed)
Pt seen in clinic today INR=2.2 No changes to report Had high BP a few weeks ago and went to urgent care as husband insisted She has doubled her cozaar to 100mg  daily and will f/u with PCP No diet changes and feeling well since neck surgery in August   Continue Coumadin 7.5 mg daily except 10 mg on Mondays.  Return on Tuesday 05/30/14 for INR check at 10:30am for lab and 10:45am for Coumadin clinic

## 2014-05-03 ENCOUNTER — Other Ambulatory Visit (INDEPENDENT_AMBULATORY_CARE_PROVIDER_SITE_OTHER): Payer: 59

## 2014-05-03 ENCOUNTER — Encounter: Payer: Self-pay | Admitting: Internal Medicine

## 2014-05-03 ENCOUNTER — Ambulatory Visit (INDEPENDENT_AMBULATORY_CARE_PROVIDER_SITE_OTHER): Payer: 59 | Admitting: Internal Medicine

## 2014-05-03 ENCOUNTER — Telehealth: Payer: Self-pay | Admitting: Internal Medicine

## 2014-05-03 VITALS — BP 130/86 | HR 79 | Temp 98.5°F | Wt 138.0 lb

## 2014-05-03 DIAGNOSIS — M4712 Other spondylosis with myelopathy, cervical region: Secondary | ICD-10-CM

## 2014-05-03 DIAGNOSIS — I10 Essential (primary) hypertension: Secondary | ICD-10-CM

## 2014-05-03 DIAGNOSIS — R202 Paresthesia of skin: Secondary | ICD-10-CM | POA: Diagnosis not present

## 2014-05-03 DIAGNOSIS — M4722 Other spondylosis with radiculopathy, cervical region: Secondary | ICD-10-CM

## 2014-05-03 DIAGNOSIS — M5440 Lumbago with sciatica, unspecified side: Secondary | ICD-10-CM | POA: Diagnosis not present

## 2014-05-03 DIAGNOSIS — I742 Embolism and thrombosis of arteries of the upper extremities: Secondary | ICD-10-CM

## 2014-05-03 LAB — CBC WITH DIFFERENTIAL/PLATELET
BASOS PCT: 0.5 % (ref 0.0–3.0)
Basophils Absolute: 0 10*3/uL (ref 0.0–0.1)
EOS ABS: 0.1 10*3/uL (ref 0.0–0.7)
Eosinophils Relative: 1.2 % (ref 0.0–5.0)
HCT: 39.1 % (ref 36.0–46.0)
HEMOGLOBIN: 12.9 g/dL (ref 12.0–15.0)
LYMPHS PCT: 32.9 % (ref 12.0–46.0)
Lymphs Abs: 1.8 10*3/uL (ref 0.7–4.0)
MCHC: 33 g/dL (ref 30.0–36.0)
MCV: 108.3 fl — ABNORMAL HIGH (ref 78.0–100.0)
MONOS PCT: 9.5 % (ref 3.0–12.0)
Monocytes Absolute: 0.5 10*3/uL (ref 0.1–1.0)
Neutro Abs: 3.1 10*3/uL (ref 1.4–7.7)
Neutrophils Relative %: 55.9 % (ref 43.0–77.0)
Platelets: 285 10*3/uL (ref 150.0–400.0)
RBC: 3.61 Mil/uL — AB (ref 3.87–5.11)
RDW: 14.1 % (ref 11.5–15.5)
WBC: 5.6 10*3/uL (ref 4.0–10.5)

## 2014-05-03 LAB — BASIC METABOLIC PANEL
BUN: 20 mg/dL (ref 6–23)
CHLORIDE: 102 meq/L (ref 96–112)
CO2: 23 mEq/L (ref 19–32)
CREATININE: 0.9 mg/dL (ref 0.4–1.2)
Calcium: 9.6 mg/dL (ref 8.4–10.5)
GFR: 65.08 mL/min (ref >60.00)
Glucose, Bld: 83 mg/dL (ref 70–99)
Potassium: 4.4 mEq/L (ref 3.5–5.1)
Sodium: 136 mEq/L (ref 135–145)

## 2014-05-03 LAB — TSH: TSH: 4.34 u[IU]/mL (ref 0.35–4.50)

## 2014-05-03 LAB — VITAMIN B12: Vitamin B-12: 570 pg/mL (ref 211–911)

## 2014-05-03 MED ORDER — ZOLPIDEM TARTRATE 10 MG PO TABS: 10.0000 mg | ORAL_TABLET | Freq: Every evening | ORAL | Status: DC | PRN

## 2014-05-03 NOTE — Telephone Encounter (Signed)
i took care of it. Thx.

## 2014-05-03 NOTE — Progress Notes (Signed)
   Subjective:    HPI  F/u neck pain: Dr Orinda Kenner operated on 01/18/14 - doing well  F/u L knee pain - Dr Wynelle Link will need to operate on it later   The patient is here to follow up on chronic depression, anxiety, headaches and chronic moderate fibromyalgia symptoms controlled with medicines, diet and exercise. Her sister died a while ago  She had an allergic reactions in August 2012 x 2 hives all over, itching. No hives this summer.   Wt Readings from Last 3 Encounters:  05/03/14 138 lb (62.596 kg)  01/30/14 141 lb 11.2 oz (64.275 kg)  01/18/14 142 lb (64.411 kg)   BP Readings from Last 3 Encounters:  05/03/14 130/86  04/20/14 177/105  01/30/14 188/97      Review of Systems  Constitutional: Negative for chills, activity change, appetite change, fatigue and unexpected weight change.  HENT: Negative for congestion, mouth sores and sinus pressure.   Eyes: Negative for visual disturbance.  Respiratory: Negative for cough and chest tightness.   Gastrointestinal: Negative for nausea and abdominal pain.  Genitourinary: Negative for frequency, difficulty urinating and vaginal pain.  Musculoskeletal: Negative for back pain and gait problem.  Skin: Negative for pallor and rash.  Neurological: Negative for dizziness, tremors, weakness, numbness and headaches.  Psychiatric/Behavioral: Negative for confusion and sleep disturbance.       Objective:   Physical Exam  Constitutional: She appears well-developed. No distress.  HENT:  Head: Normocephalic.  Right Ear: External ear normal.  Left Ear: External ear normal.  Nose: Nose normal.  Mouth/Throat: Oropharynx is clear and moist.  Eyes: Conjunctivae are normal. Pupils are equal, round, and reactive to light. Right eye exhibits no discharge. Left eye exhibits no discharge.  Neck: Normal range of motion. Neck supple. No JVD present. No tracheal deviation present. No thyromegaly present.  Cardiovascular: Normal rate, regular  rhythm and normal heart sounds.   Pulmonary/Chest: No stridor. No respiratory distress. She has no wheezes.  Abdominal: Soft. Bowel sounds are normal. She exhibits no distension and no mass. There is no tenderness. There is no rebound and no guarding.  Musculoskeletal: She exhibits no edema or tenderness.  Lymphadenopathy:    She has no cervical adenopathy.  Neurological: She displays normal reflexes. No cranial nerve deficit. She exhibits normal muscle tone. Coordination normal.  Skin: No rash noted. No erythema.  Psychiatric: She has a normal mood and affect. Her behavior is normal. Judgment and thought content normal.  Scar on neck is healed  Lab Results  Component Value Date   WBC 6.7 03/21/2014   HGB 12.7 03/21/2014   HCT 38.7 03/21/2014   PLT 286 03/21/2014   GLUCOSE 78 03/21/2014   CHOL 209* 05/31/2012   TRIG 101.0 05/31/2012   HDL 61.50 05/31/2012   LDLDIRECT 129.8 05/31/2012   LDLCALC 115* 11/19/2010   ALT 15 03/21/2014   AST 22 03/21/2014   NA 132* 03/21/2014   K 4.9 03/21/2014   CL 99 01/11/2014   CREATININE 0.9 03/21/2014   BUN 16.0 03/21/2014   CO2 26 03/21/2014   TSH 1.04 05/31/2012   INR 2.2 04/25/2014         Assessment & Plan:  Patient ID: Tracy Peters, female   DOB: 18-Feb-1934, 80 y.o.   MRN: [id number]

## 2014-05-03 NOTE — Telephone Encounter (Signed)
Pt saw plotnikov during appt today and forgot to ask about picking up Rx for Cipro for her daughter. Please review this request and advise.

## 2014-05-03 NOTE — Assessment & Plan Note (Signed)
Continue with current prescription therapy as reflected on the Med list.  

## 2014-05-03 NOTE — Progress Notes (Signed)
Pre visit review using our clinic review tool, if applicable. No additional management support is needed unless otherwise documented below in the visit note. 

## 2014-05-03 NOTE — Assessment & Plan Note (Signed)
Dr Orinda Kenner operated on 01/18/14 - doing well

## 2014-05-19 DIAGNOSIS — G5600 Carpal tunnel syndrome, unspecified upper limb: Secondary | ICD-10-CM | POA: Insufficient documentation

## 2014-05-30 ENCOUNTER — Ambulatory Visit (HOSPITAL_BASED_OUTPATIENT_CLINIC_OR_DEPARTMENT_OTHER): Payer: 59 | Admitting: Pharmacist

## 2014-05-30 ENCOUNTER — Telehealth: Payer: Self-pay | Admitting: Pharmacist

## 2014-05-30 ENCOUNTER — Other Ambulatory Visit (HOSPITAL_COMMUNITY): Payer: Self-pay | Admitting: Neurosurgery

## 2014-05-30 ENCOUNTER — Other Ambulatory Visit (HOSPITAL_BASED_OUTPATIENT_CLINIC_OR_DEPARTMENT_OTHER): Payer: 59

## 2014-05-30 DIAGNOSIS — I742 Embolism and thrombosis of arteries of the upper extremities: Secondary | ICD-10-CM | POA: Diagnosis not present

## 2014-05-30 DIAGNOSIS — Z7901 Long term (current) use of anticoagulants: Secondary | ICD-10-CM

## 2014-05-30 LAB — POCT INR: INR: 2.8

## 2014-05-30 LAB — PROTIME-INR
INR: 2.8 (ref 2.00–3.50)
Protime: 33.6 Seconds — ABNORMAL HIGH (ref 10.6–13.4)

## 2014-05-30 NOTE — Telephone Encounter (Signed)
Instructions for anticoagulation around wrist surgery received from Dr. Beryle Beams. Stop Coumadin 3 days prior to surgery. Check INR the day before surgery. Resume Coumadin night of surgery. Begin Lovenox 48 hours post op. Continue Lovenox until INR therapeutic.

## 2014-05-30 NOTE — Progress Notes (Signed)
INR = 2.8     Goal 2-3 INR within goal range. No complications of anticoagulation noted. [name] is planning to have surgery on her right wrist this month. She would like to get this in prior to the end of the year as she has met her deductible for 2015. She is waiting to hear from her neurosurgeon, Dr. Arnoldo Morale, as to when this will be scheduled. When they let her know the date for this surgery she will call us immediately. I will get in touch with Dr. Beryle Beams to get instructions for bridging around the surgery. She states that Dr. Beryle Beams had asked her not to have surgery until 2016, but this is unavoidable. Made patient aware that Dr. Darnell Level will likely be out of town during her surgery.  She will continue Coumadin 7.5 mg daily except 10 mg on Mondays. She will return on 07/25/14 for INR check at the coumadin clinic at 10:30am for lab and 10:45am for Coumadin clinic. If wrist surgery is scheduled, she will call and let us know so that we can coordinate anticoagulation around surgery.  Theone Murdoch, PharmD

## 2014-05-31 ENCOUNTER — Encounter (HOSPITAL_COMMUNITY): Payer: Self-pay | Admitting: Pharmacy Technician

## 2014-05-31 ENCOUNTER — Telehealth: Payer: Self-pay

## 2014-05-31 NOTE — Telephone Encounter (Signed)
I spoke with Tracy Peters regarding her anticoagulation plans this afternoon. Dr. Arnoldo Morale' office had called [name] this morning to inform us that she will have her procedure at 7:30 am on 12/28, and her INR will be checked prior to surgery at 5:30 am.   I called Tracy Peters to ensure she was aware of these plans, and she stated she was. She also stated her anticoagulation plans for this upcoming surgery are to stop Coumadin on 12/25, and resume it the night of surgery (12/28). She will start Lovenox 12/30 and continue bridging until her INR is within her goal range.  She stated she will need some Lovenox samples, and she will pick this up sometime next week. We reviewed the dose she will be using (100 mg injected subcutaneously daily) and I told her we would be able to supply her with this during this time. We will also need to schedule follow up appointments after she resumes her Coumadin (likely January 4th).  Tracy Peters voiced her understanding of the plan, and all questions were addressed at this time.

## 2014-05-31 NOTE — Pre-Procedure Instructions (Signed)
Tracy Peters  05/31/2014   Your procedure is scheduled on:  Mon, Dec 28 @ 7:30 AM  Report to [facility]  at 5:30 AM.  Call this number if you have problems the morning of surgery: [phone]   Remember:   Do not eat food or drink liquids after midnight.   Take these medicines the morning of surgery with A SIP OF WATER: Cipro(Ciprofloxacin),Allegra(Fexofenadine-if needed),Flonase(Fluticasone),Valtrex(Valcyclovir),and Tramadol(Ultram-if needed)               Stop taking your Coumadin as you have been instructed. No Goody's,BC's,Aleve,Aspirin,Ibuprofen,Fish Oil,or any Herbal Medications   Do not wear jewelry, make-up or nail polish.  Do not wear lotions, powders, or perfumes. You may wear deodorant.  Do not shave 48 hours prior to surgery.   Do not bring valuables to the hospital.  [facility] is not responsible                  for any belongings or valuables.               Contacts, dentures or bridgework may not be worn into surgery.  Leave suitcase in the car. After surgery it may be brought to your room.  For patients admitted to the hospital, discharge time is determined by your                treatment team.               Patients discharged the day of surgery will not be allowed to drive  home.    Special Instructions:  [facility] - Preparing for Surgery  Before surgery, you can play an important role.  Because skin is not sterile, your skin needs to be as free of germs as possible.  You can reduce the number of germs on you skin by washing with CHG (chlorahexidine gluconate) soap before surgery.  CHG is an antiseptic cleaner which kills germs and bonds with the skin to continue killing germs even after washing.  Please DO NOT use if you have an allergy to CHG or antibacterial soaps.  If your skin becomes reddened/irritated stop using the CHG and inform your nurse when you arrive at Short Stay.  Do not shave (including legs and underarms) for at least 48 hours  prior to the first CHG shower.  You may shave your face.  Please follow these instructions carefully:   1.  Shower with CHG Soap the night before surgery and the                                morning of Surgery.  2.  If you choose to wash your hair, wash your hair first as usual with your       normal shampoo.  3.  After you shampoo, rinse your hair and body thoroughly to remove the                      Shampoo.  4.  Use CHG as you would any other liquid soap.  You can apply chg directly       to the skin and wash gently with scrungie or a clean washcloth.  5.  Apply the CHG Soap to your body ONLY FROM THE NECK DOWN.        Do not use on open wounds or open sores.  Avoid contact with your eyes,  ears, mouth and genitals (private parts).  Wash genitals (private parts)       with your normal soap.  6.  Wash thoroughly, paying special attention to the area where your surgery        will be performed.  7.  Thoroughly rinse your body with warm water from the neck down.  8.  DO NOT shower/wash with your normal soap after using and rinsing off       the CHG Soap.  9.  Pat yourself dry with a clean towel.            10.  Wear clean pajamas.            11.  Place clean sheets on your bed the night of your first shower and do not        sleep with pets.  Day of Surgery  Do not apply any lotions/deoderants the morning of surgery.  Please wear clean clothes to the hospital/surgery center.     Please read over the following fact sheets that you were given: Pain Booklet, Coughing and Deep Breathing and Surgical Site Infection Prevention

## 2014-06-01 ENCOUNTER — Encounter (HOSPITAL_COMMUNITY)
Admission: RE | Admit: 2014-06-01 | Discharge: 2014-06-01 | Disposition: A | Discharge status: Home / Self Care | Payer: 59 | Source: Ambulatory Visit | Attending: Neurosurgery | Admitting: Neurosurgery

## 2014-06-01 ENCOUNTER — Encounter (HOSPITAL_COMMUNITY): Payer: Self-pay

## 2014-06-01 DIAGNOSIS — E785 Hyperlipidemia, unspecified: Secondary | ICD-10-CM | POA: Insufficient documentation

## 2014-06-01 DIAGNOSIS — I742 Embolism and thrombosis of arteries of the upper extremities: Secondary | ICD-10-CM | POA: Insufficient documentation

## 2014-06-01 DIAGNOSIS — I10 Essential (primary) hypertension: Secondary | ICD-10-CM | POA: Insufficient documentation

## 2014-06-01 DIAGNOSIS — K219 Gastro-esophageal reflux disease without esophagitis: Secondary | ICD-10-CM | POA: Insufficient documentation

## 2014-06-01 DIAGNOSIS — Z981 Arthrodesis status: Secondary | ICD-10-CM | POA: Diagnosis not present

## 2014-06-01 DIAGNOSIS — M810 Age-related osteoporosis without current pathological fracture: Secondary | ICD-10-CM | POA: Diagnosis not present

## 2014-06-01 DIAGNOSIS — Z01818 Encounter for other preprocedural examination: Secondary | ICD-10-CM | POA: Diagnosis not present

## 2014-06-01 DIAGNOSIS — K589 Irritable bowel syndrome without diarrhea: Secondary | ICD-10-CM | POA: Insufficient documentation

## 2014-06-01 DIAGNOSIS — Z9049 Acquired absence of other specified parts of digestive tract: Secondary | ICD-10-CM | POA: Diagnosis not present

## 2014-06-01 DIAGNOSIS — D649 Anemia, unspecified: Secondary | ICD-10-CM | POA: Diagnosis not present

## 2014-06-01 DIAGNOSIS — Q600 Renal agenesis, unilateral: Secondary | ICD-10-CM | POA: Diagnosis not present

## 2014-06-01 DIAGNOSIS — Z7901 Long term (current) use of anticoagulants: Secondary | ICD-10-CM | POA: Insufficient documentation

## 2014-06-01 DIAGNOSIS — Z90710 Acquired absence of both cervix and uterus: Secondary | ICD-10-CM | POA: Insufficient documentation

## 2014-06-01 HISTORY — DX: Personal history of colon polyps, unspecified: Z86.0100

## 2014-06-01 HISTORY — DX: Personal history of other infectious and parasitic diseases: Z86.19

## 2014-06-01 HISTORY — DX: Diverticulosis of intestine, part unspecified, without perforation or abscess without bleeding: K57.90

## 2014-06-01 HISTORY — DX: Personal history of colonic polyps: Z86.010

## 2014-06-01 HISTORY — DX: Carpal tunnel syndrome, unspecified upper limb: G56.00

## 2014-06-01 HISTORY — DX: Unspecified cataract: H26.9

## 2014-06-01 HISTORY — DX: Unspecified osteoarthritis, unspecified site: M19.90

## 2014-06-01 HISTORY — DX: Raynaud's syndrome without gangrene: I73.00

## 2014-06-01 HISTORY — DX: Nocturia: R35.1

## 2014-06-01 HISTORY — DX: Bronchitis, not specified as acute or chronic: J40

## 2014-06-01 HISTORY — DX: Unspecified urinary incontinence: R32

## 2014-06-01 LAB — CBC
HCT: 35.8 % — ABNORMAL LOW (ref 36.0–46.0)
Hemoglobin: 12.2 g/dL (ref 12.0–15.0)
MCH: 35.5 pg — ABNORMAL HIGH (ref 26.0–34.0)
MCHC: 34.1 g/dL (ref 30.0–36.0)
MCV: 104.1 fL — ABNORMAL HIGH (ref 78.0–100.0)
Platelets: 269 10*3/uL (ref 150–400)
RBC: 3.44 MIL/uL — ABNORMAL LOW (ref 3.87–5.11)
RDW: 13.3 % (ref 11.5–15.5)
WBC: 7.8 10*3/uL (ref 4.0–10.5)

## 2014-06-01 LAB — BASIC METABOLIC PANEL
ANION GAP: 12 (ref 5–15)
BUN: 14 mg/dL (ref 6–23)
CO2: 26 mEq/L (ref 19–32)
Calcium: 9.9 mg/dL (ref 8.4–10.5)
Chloride: 96 mEq/L (ref 96–112)
Creatinine, Ser: 0.84 mg/dL (ref 0.50–1.10)
GFR calc Af Amer: 75 mL/min — ABNORMAL LOW (ref >90)
GFR, EST NON AFRICAN AMERICAN: 65 mL/min — AB (ref >90)
Glucose, Bld: 92 mg/dL (ref 70–99)
Potassium: 4.7 mEq/L (ref 3.7–5.3)
SODIUM: 134 meq/L — AB (ref 137–147)

## 2014-06-01 NOTE — Progress Notes (Addendum)
Medical MD is Dr.Aleksei Plotnikov  EKG and CXR in epic from 01-11-14  Echo done at least 15 yrs ago  Stress test done at least 15 yrs ago  Denies ever having a heart cath  Pt doesn't have a cardiologist

## 2014-06-02 NOTE — Progress Notes (Signed)
Anesthesia chart review: Patient is a 80 year old female posted for right carpal tunnel release on 06/02/14 by Dr. Arnoldo Morale.  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 PCP is Dr. Alain Marion.Hematologist is Dr. Beryle Beams. She is scheduled to stop Coumadin on 06/09/14 and check INR the day before surgery. Resume night of surgery.  She will start Lovenox bridge on 12/30 and continue until her INR is therapeutic.   EKG on 01/11/14 showed: NSR, septal infarct (age undetermined). Overall, her EKG appears stable when compared to her EKG on 08/31/08. (Septal infarct was not noted on her 06/24/04 EKG in [facility], but I think she may have had lead reversal in V2 and V3.) She reported prior stress and echo > 15 years ago.  CXR on 01/11/14 showed: No active cardiopulmonary disease.  Labs from 05/30/14 and 06/01/14 noted. She is for PT/PTT on the day of surgery.  If her same day lab results are acceptable and otherwise no acute changes that I would anticipate that she could proceed as planned.  [facility] Stay [facility]/Anesthesiology Phone [phone] 06/02/2014 11:33 AM

## 2014-06-12 ENCOUNTER — Encounter (HOSPITAL_COMMUNITY)
Admission: RE | Disposition: A | Discharge status: Home / Self Care | Payer: Self-pay | Source: Ambulatory Visit | Attending: Neurosurgery

## 2014-06-12 ENCOUNTER — Encounter (HOSPITAL_COMMUNITY): Payer: Self-pay | Admitting: Anesthesiology

## 2014-06-12 ENCOUNTER — Ambulatory Visit (HOSPITAL_COMMUNITY)
Admission: RE | Admit: 2014-06-12 | Discharge: 2014-06-12 | Disposition: A | Discharge status: Home / Self Care | Payer: 59 | Source: Ambulatory Visit | Attending: Neurosurgery | Admitting: Neurosurgery

## 2014-06-12 ENCOUNTER — Ambulatory Visit (HOSPITAL_COMMUNITY): Payer: 59 | Admitting: Vascular Surgery

## 2014-06-12 ENCOUNTER — Ambulatory Visit (HOSPITAL_COMMUNITY): Payer: 59 | Admitting: Anesthesiology

## 2014-06-12 DIAGNOSIS — I739 Peripheral vascular disease, unspecified: Secondary | ICD-10-CM | POA: Insufficient documentation

## 2014-06-12 DIAGNOSIS — Z86718 Personal history of other venous thrombosis and embolism: Secondary | ICD-10-CM | POA: Diagnosis not present

## 2014-06-12 DIAGNOSIS — G4700 Insomnia, unspecified: Secondary | ICD-10-CM | POA: Insufficient documentation

## 2014-06-12 DIAGNOSIS — K219 Gastro-esophageal reflux disease without esophagitis: Secondary | ICD-10-CM | POA: Insufficient documentation

## 2014-06-12 DIAGNOSIS — M1990 Unspecified osteoarthritis, unspecified site: Secondary | ICD-10-CM | POA: Diagnosis not present

## 2014-06-12 DIAGNOSIS — E785 Hyperlipidemia, unspecified: Secondary | ICD-10-CM | POA: Diagnosis not present

## 2014-06-12 DIAGNOSIS — G5601 Carpal tunnel syndrome, right upper limb: Secondary | ICD-10-CM | POA: Insufficient documentation

## 2014-06-12 DIAGNOSIS — Z7901 Long term (current) use of anticoagulants: Secondary | ICD-10-CM | POA: Insufficient documentation

## 2014-06-12 DIAGNOSIS — I10 Essential (primary) hypertension: Secondary | ICD-10-CM | POA: Diagnosis not present

## 2014-06-12 DIAGNOSIS — M545 Low back pain: Secondary | ICD-10-CM | POA: Diagnosis not present

## 2014-06-12 HISTORY — PX: CARPAL TUNNEL RELEASE: SHX101

## 2014-06-12 LAB — PROTIME-INR
INR: 1.19 (ref 0.00–1.49)
PROTHROMBIN TIME: 15.3 s — AB (ref 11.6–15.2)

## 2014-06-12 LAB — APTT: APTT: 29 s (ref 24–37)

## 2014-06-12 SURGERY — CARPAL TUNNEL RELEASE
Anesthesia: Monitor Anesthesia Care | Site: Arm Lower | Laterality: Right

## 2014-06-12 MED ORDER — LACTATED RINGERS IV SOLN
INTRAVENOUS | Status: DC | PRN
  Administered 2014-06-12: 07:00:00 via INTRAVENOUS

## 2014-06-12 MED ORDER — SODIUM CHLORIDE 0.9 % IR SOLN
Status: DC | PRN
  Administered 2014-06-12: 08:00:00

## 2014-06-12 MED ORDER — PROPOFOL 10 MG/ML IV BOLUS
INTRAVENOUS | Status: AC
  Filled 2014-06-12: qty 20

## 2014-06-12 MED ORDER — PROPOFOL INFUSION 10 MG/ML OPTIME
INTRAVENOUS | Status: DC | PRN
  Administered 2014-06-12: 100 ug/kg/min via INTRAVENOUS

## 2014-06-12 MED ORDER — ONDANSETRON HCL 4 MG/2ML IJ SOLN: 4.0000 mg | Freq: Once | INTRAMUSCULAR | Status: DC | PRN

## 2014-06-12 MED ORDER — HYDROMORPHONE HCL 1 MG/ML IJ SOLN: 0.2500 mg | INTRAMUSCULAR | Status: DC | PRN

## 2014-06-12 MED ORDER — BUPIVACAINE-EPINEPHRINE 0.5% -1:200000 IJ SOLN
INTRAMUSCULAR | Status: DC | PRN
  Administered 2014-06-12: 5 mL

## 2014-06-12 MED ORDER — PHENYLEPHRINE 40 MCG/ML (10ML) SYRINGE FOR IV PUSH (FOR BLOOD PRESSURE SUPPORT)
PREFILLED_SYRINGE | INTRAVENOUS | Status: AC
  Filled 2014-06-12: qty 10

## 2014-06-12 MED ORDER — ONDANSETRON HCL 4 MG/2ML IJ SOLN
INTRAMUSCULAR | Status: DC | PRN
  Administered 2014-06-12: 4 mg via INTRAVENOUS

## 2014-06-12 MED ORDER — VANCOMYCIN HCL IN DEXTROSE 1-5 GM/200ML-% IV SOLN
INTRAVENOUS | Status: AC
  Filled 2014-06-12: qty 200

## 2014-06-12 MED ORDER — PROPOFOL 10 MG/ML IV BOLUS
INTRAVENOUS | Status: AC
Start: 2014-06-12 — End: 2014-06-12
  Filled 2014-06-12: qty 20

## 2014-06-12 MED ORDER — BACITRACIN ZINC 500 UNIT/GM EX OINT
TOPICAL_OINTMENT | CUTANEOUS | Status: DC | PRN
  Administered 2014-06-12: 1 via TOPICAL

## 2014-06-12 MED ORDER — FENTANYL CITRATE 0.05 MG/ML IJ SOLN
INTRAMUSCULAR | Status: AC
  Filled 2014-06-12: qty 5

## 2014-06-12 MED ORDER — VANCOMYCIN HCL 1000 MG IV SOLR
1000.0000 mg | INTRAVENOUS | Status: DC | PRN
  Administered 2014-06-12: 1000 mg via INTRAVENOUS

## 2014-06-12 MED ORDER — FENTANYL CITRATE 0.05 MG/ML IJ SOLN
INTRAMUSCULAR | Status: DC | PRN
  Administered 2014-06-12 (×2): 50 ug via INTRAVENOUS
  Administered 2014-06-12: 25 ug via INTRAVENOUS

## 2014-06-12 SURGICAL SUPPLY — 42 items
BAG DECANTER FOR FLEXI CONT (MISCELLANEOUS) ×3 IMPLANT
BANDAGE ELASTIC 4 VELCRO ST LF (GAUZE/BANDAGES/DRESSINGS) ×3 IMPLANT
BANDAGE GAUZE 4  KLING STR (GAUZE/BANDAGES/DRESSINGS) ×3 IMPLANT
BLADE SURG 15 STRL LF DISP TIS (BLADE) ×1 IMPLANT
BLADE SURG 15 STRL SS (BLADE) ×2
BRUSH SCRUB EZ PLAIN DRY (MISCELLANEOUS) ×3 IMPLANT
CORDS BIPOLAR (ELECTRODE) ×3 IMPLANT
DRAPE EXTREMITY T 121X128X90 (DRAPE) ×3 IMPLANT
DRAPE PROXIMA HALF (DRAPES) ×6 IMPLANT
GAUZE SPONGE 4X4 12PLY STRL (GAUZE/BANDAGES/DRESSINGS) ×3 IMPLANT
GAUZE SPONGE 4X4 16PLY XRAY LF (GAUZE/BANDAGES/DRESSINGS) ×3 IMPLANT
GLOVE BIO SURGEON STRL SZ8.5 (GLOVE) ×3 IMPLANT
GLOVE BIOGEL PI IND STRL 7.0 (GLOVE) ×2 IMPLANT
GLOVE BIOGEL PI INDICATOR 7.0 (GLOVE) ×4
GLOVE EXAM NITRILE LRG STRL (GLOVE) IMPLANT
GLOVE EXAM NITRILE MD LF STRL (GLOVE) IMPLANT
GLOVE EXAM NITRILE XL STR (GLOVE) IMPLANT
GLOVE EXAM NITRILE XS STR PU (GLOVE) IMPLANT
GLOVE SS BIOGEL STRL SZ 8 (GLOVE) ×1 IMPLANT
GLOVE SUPERSENSE BIOGEL SZ 8 (GLOVE) ×2
GLOVE SURG SS PI 7.0 STRL IVOR (GLOVE) ×9 IMPLANT
GOWN STRL REUS W/ TWL LRG LVL3 (GOWN DISPOSABLE) ×1 IMPLANT
GOWN STRL REUS W/ TWL XL LVL3 (GOWN DISPOSABLE) ×1 IMPLANT
GOWN STRL REUS W/TWL LRG LVL3 (GOWN DISPOSABLE) ×2
GOWN STRL REUS W/TWL XL LVL3 (GOWN DISPOSABLE) ×2
KIT BASIN OR (CUSTOM PROCEDURE TRAY) ×3 IMPLANT
KIT ROOM TURNOVER OR (KITS) ×3 IMPLANT
MARKER SKIN DUAL TIP RULER LAB (MISCELLANEOUS) ×3 IMPLANT
NEEDLE HYPO 25X1 1.5 SAFETY (NEEDLE) ×3 IMPLANT
NS IRRIG 1000ML POUR BTL (IV SOLUTION) ×3 IMPLANT
PACK SURGICAL SETUP 50X90 (CUSTOM PROCEDURE TRAY) ×3 IMPLANT
PAD ARMBOARD 7.5X6 YLW CONV (MISCELLANEOUS) ×3 IMPLANT
STOCKINETTE 4X48 STRL (DRAPES) ×3 IMPLANT
SUT ETHILON 3 0 PS 1 (SUTURE) ×3 IMPLANT
SUT VIC AB 3-0 SH 8-18 (SUTURE) ×3 IMPLANT
SYR BULB 3OZ (MISCELLANEOUS) ×3 IMPLANT
SYR CONTROL 10ML LL (SYRINGE) ×3 IMPLANT
TOWEL OR 17X24 6PK STRL BLUE (TOWEL DISPOSABLE) ×9 IMPLANT
TUBE CONNECTING 12'X1/4 (SUCTIONS) ×1
TUBE CONNECTING 12X1/4 (SUCTIONS) ×2 IMPLANT
UNDERPAD 30X30 INCONTINENT (UNDERPADS AND DIAPERS) ×3 IMPLANT
WATER STERILE IRR 1000ML POUR (IV SOLUTION) ×3 IMPLANT

## 2014-06-12 NOTE — Transfer of Care (Signed)
Immediate Anesthesia Transfer of Care Note  Patient: Tracy Peters  Procedure(s) Performed: Procedure(s) with comments: CARPAL TUNNEL RELEASE (Right) - Right Carpal Tunnel Release  Patient Location: PACU  Anesthesia Type:MAC  Level of Consciousness: awake, alert  and oriented  Airway & Oxygen Therapy: Patient Spontanous Breathing  Post-op Assessment: Report given to PACU RN  Post vital signs: Reviewed and stable  Complications: No apparent anesthesia complications

## 2014-06-12 NOTE — H&P (Cosign Needed)
Subjective: The patient is a 80 year old white female who has complained of right hand numbness. She was worked up with NCV EMGs which confirmed carpal tunnel syndrome. She has decided to proceed with surgery.   Past Medical History  Diagnosis Date  . LBP (low back pain)   . Long term (current) use of anticoagulants     Dr. Beryle Beams  . History of DVT (deep vein thrombosis) 1998    right arm  . GERD (gastroesophageal reflux disease)     mild  . Insomnia     takes Ambien nightly as needed  . Herpes     takes Valtrex daily  . Allergic rhinitis     uses FLonase nightly  . Hyperlipidemia     Medical MD doesn't think its absolutely necessary  . Diarrhea     takes Immodium daily as needed or Lomotil   . Adenomatous colon polyp   . IBS (irritable bowel syndrome)     takes Electronics engineer daily  . UTI (urinary tract infection)   . 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 Anemia   . Blood transfusion   . Osteoporosis   . Congenital absence of one kidney   . Chronic anticoagulation 03/21/2013  . Hypertension     takes Losartan daily  . Raynaud's disease   . Bronchitis     hx of-7-18yrs ago  . Carpal tunnel syndrome     numbness and tingling   . Arthritis     joint pain  . History of colon polyps   . Diverticulosis   . Cataracts, bilateral   . Nocturia   . Urinary leakage   . History of staph infection     Past Surgical History  Procedure Laterality Date  . Cholecystectomy    . Abdominal hysterectomy    . Appendectomy    . Breast surgery      cystectomy-benign  . Lumbar laminectomy      X 2  . Knee surgery      left x 3  . Shoulder surgery      Right  . Back surgery      2  . Anterior cervical decomp/discectomy fusion N/A 01/18/2014    Procedure: ANTERIOR CERVICAL DECOMPRESSION/DISCECTOMY FUSION 3 LEVELS  Cervical  four/five, five/six, six/seven anterior cervical decompression with fusion interbody prosthesis with plating and  bonegraft;  Surgeon: Newman Pies, MD;  Location: [facility];  Service: Neurosurgery;  Laterality: N/A;  t  . Mortons neuromas removed    . Colonoscopy    . I&d of abdomen  1988    couple of wks after gallbladder removed    Allergies  Allergen Reactions  . Bee Venom Anaphylaxis    Yellow jackets, wasps as well  . Ivp Dye [Iodinated Diagnostic Agents] Anaphylaxis  . Shellfish Allergy Anaphylaxis  . Amlodipine Besylate     REACTION: swelling  . Aspirin     REACTION: unspecified  . Atorvastatin     REACTION: diarrhea  . Cholestyramine     REACTION: mouth irritation  . Codeine Phosphate Nausea And Vomiting  . Demerol [Meperidine]     Severe Hallucination!!!!  . Diphenhydramine Hcl Other (See Comments)    hyperactive  . Doxycycline     REACTION: nausea  . Gentamicin Sulfate     REACTION: unspecified  . Meclizine Hcl     REACTION: more dizziness on it  . Meperidine Hcl   . Metronidazole  REACTION: bad taste  . Moxifloxacin     REACTION: insomnia Able to use Cipro  . Sulfamethoxazole     Kidney problems  . Penicillins Swelling and Rash    History  Substance Use Topics  . Smoking status: Never Smoker   . Smokeless tobacco: Never Used  . Alcohol Use: No    Family History  Problem Relation Age of Onset  . Breast cancer Sister   . Kidney disease Sister 58    nephritis  . Heart disease Brother   . Osteoarthritis Other   . Parkinsonism Mother   . Ulcerative colitis Sister   . Colon cancer Neg Hx    Prior to Admission medications   Medication Sig Start Date End Date Taking? Authorizing Provider  diphenoxylate-atropine (LOMOTIL) 2.5-0.025 MG per tablet Take 1 tablet by mouth 2 (two) times daily as needed for diarrhea or loose stools.    Yes Historical Provider, MD  EPINEPHrine (EPIPEN) 0.3 mg/0.3 mL IJ SOAJ injection As dirrected 12/28/13  Yes Aleksei Plotnikov V, MD  fexofenadine (ALLEGRA) 180 MG tablet Take 180 mg by mouth daily as needed for allergies.   05/28/11  Yes Aleksei Plotnikov V, MD  fish oil-omega-3 fatty acids 1000 MG capsule Take 2 g by mouth daily.    Yes Historical Provider, MD  fluticasone (FLONASE) 50 MCG/ACT nasal spray Place 2 sprays into both nostrils daily.   Yes Historical Provider, MD  loperamide (IMODIUM A-D) 2 MG tablet Take 2 mg by mouth 4 (four) times daily as needed for diarrhea or loose stools. Will take one by mouth 98min before meals or two as needed.   Yes Historical Provider, MD  losartan (COZAAR) 100 MG tablet Take 0.5 tablets (50 mg total) by mouth daily. Patient taking differently: Take 100 mg by mouth daily.  03/14/14  Yes Aleksei Plotnikov V, MD  Probiotic Product (ALIGN) 4 MG CAPS Take by mouth daily.     Yes Historical Provider, MD  traMADol (ULTRAM) 50 MG tablet Take 50-100 mg by mouth every 12 (twelve) hours as needed for moderate pain.   Yes Historical Provider, MD  triamcinolone ointment (KENALOG) 0.5 % Apply 1 application topically 3 (three) times daily. 06/29/13  Yes Aleksei Plotnikov V, MD  valACYclovir (VALTREX) 500 MG tablet Take 2 tablets (1,000 mg total) by mouth 2 (two) times daily.   Yes Aleksei Plotnikov V, MD  warfarin (COUMADIN) 5 MG tablet Take 7.5-10 mg by mouth daily. Take 7.5 mg daily except on mondays take 10mg    Yes Historical Provider, MD  zolpidem (AMBIEN) 10 MG tablet Take 1 tablet (10 mg total) by mouth at bedtime as needed for sleep. 05/03/14  Yes Aleksei Plotnikov V, MD  acyclovir ointment (ZOVIRAX) 5 % Apply 1 application topically daily as needed (for outbreak).     Historical Provider, MD  ciprofloxacin (CIPRO) 250 MG tablet Take 1 tablet (250 mg total) by mouth 2 (two) times daily. Patient not taking: Reported on 05/31/2014 12/28/13   Tyrone Apple Plotnikov V, MD  docusate sodium 100 MG CAPS Take 100 mg by mouth 2 (two) times daily. Patient not taking: Reported on 05/31/2014 01/20/14   Newman Pies, MD  enoxaparin (LOVENOX) 100 MG/ML injection Inject 100 mg into the skin daily. Daily  for 4 nights. Completed on 01/16/14    Historical Provider, MD     Review of Systems  Positive ROS: As above  All other systems have been reviewed and were otherwise negative with the exception of those mentioned in the  HPI and as above.  Objective: Vital signs in last 24 hours: Pulse Rate:  [73] 73 (12/28 0601) Resp:  [18] 18 (12/28 0601) BP: (142)/(58) 142/58 mmHg (12/28 0601) SpO2:  [97 %] 97 % (12/28 0601)  General Appearance: Alert, cooperative, no distress, Head: Normocephalic, without obvious abnormality, atraumatic Eyes: PERRL, conjunctiva/corneas clear, EOM's intact,    Ears: Normal  Throat: Normal  Neck: Supple, symmetrical, trachea midline, no adenopathy; thyroid: No enlargement/tenderness/nodules; no carotid bruit or JVD. The patient's cervical incision is well-healed. Back: Symmetric, no curvature, ROM normal, no CVA tenderness Lungs: Clear to auscultation bilaterally, respirations unlabored Heart: Regular rate and rhythm, no murmur, rub or gallop Abdomen: Soft, non-tender,, no masses, no organomegaly Extremities: Extremities normal, atraumatic, no cyanosis or edema Pulses: 2+ and symmetric all extremities Skin: Skin color, texture, turgor normal, no rashes or lesions  NEUROLOGIC:   Mental status: alert and oriented, no aphasia, good attention span, Fund of knowledge/ memory ok Motor Exam - grossly normal Sensory Exam - grossly normal Reflexes:  Coordination - grossly normal Gait - grossly normal Balance - grossly normal Cranial Nerves: I: smell Not tested  II: visual acuity  OS: Normal  OD: Normal   II: visual fields Full to confrontation  II: pupils Equal, round, reactive to light  III,VII: ptosis None  III,IV,VI: extraocular muscles  Full ROM  V: mastication Normal  V: facial light touch sensation  Normal  V,VII: corneal reflex  Present  VII: facial muscle function - upper  Normal  VII: facial muscle function - lower Normal  VIII: hearing Not tested   IX: soft palate elevation  Normal  IX,X: gag reflex Present  XI: trapezius strength  5/5  XI: sternocleidomastoid strength 5/5  XI: neck flexion strength  5/5  XII: tongue strength  Normal    Data Review Lab Results  Component Value Date   WBC 7.8 06/01/2014   HGB 12.2 06/01/2014   HCT 35.8* 06/01/2014   MCV 104.1* 06/01/2014   PLT 269 06/01/2014   Lab Results  Component Value Date   NA 134* 06/01/2014   K 4.7 06/01/2014   CL 96 06/01/2014   CO2 26 06/01/2014   BUN 14 06/01/2014   CREATININE 0.84 06/01/2014   GLUCOSE 92 06/01/2014   Lab Results  Component Value Date   INR 1.19 06/12/2014   PROTIME 33.6* 05/30/2014    Assessment/Plan: Right carpal tunnel syndrome: I have discussed the situation with the patient. I have discussed the various treatment options including surgery. I have described the surgical treatment option of a right carpal tunnel release. I have given her a surgical pamphlet. We have discussed the risks, benefits, alternatives, and likelihood of achieving her goals with surgery. I have answered all the patient's questions. She has decided to proceed with surgery.   Kainan Patty D 06/12/2014 7:17 AM

## 2014-06-12 NOTE — Anesthesia Preprocedure Evaluation (Signed)
Anesthesia Evaluation  Patient identified by MRN, date of birth, ID band Patient awake    Reviewed: Allergy & Precautions, H&P , NPO status , Patient's Chart, lab work & pertinent test results  Airway        Dental   Pulmonary          Cardiovascular hypertension, + Peripheral Vascular Disease     Neuro/Psych  Neuromuscular disease    GI/Hepatic GERD-  ,  Endo/Other    Renal/GU      Musculoskeletal  (+) Arthritis -,   Abdominal   Peds  Hematology   Anesthesia Other Findings   Reproductive/Obstetrics                             Anesthesia Physical Anesthesia Plan  ASA: II  Anesthesia Plan: MAC   Post-op Pain Management:    Induction: Intravenous  Airway Management Planned:   Additional Equipment:   Intra-op Plan:   Post-operative Plan:   Informed Consent:   Plan Discussed with: CRNA, Anesthesiologist and Surgeon  Anesthesia Plan Comments:         Anesthesia Quick Evaluation

## 2014-06-12 NOTE — Op Note (Signed)
Brief history: The patient is a 80 year old white female who has complained of right hand pain and numbness consistent with carpal tunnel syndrome. This diagnosis was confirmed with NCV EMGs. I discussed the various treatment options with patient including surgery. We have discussed the risks, benefits, alternatives, and likelihood of achieving our goals with surgery. She has decided to proceed with a right carpal tunnel release.  Preoperative diagnosis: Right carpal tunnel syndrome  Postoperative diagnosis: The same  Procedure: Right median nerve neural lysis at the wrist (carpal tunnel release)  Surgeon: Dr. Earle Gell  Asst.: None  Anesthesia: MAC  Estimated blood loss: Minimal  Drains: None  Complications: None  Description of procedure: The patient was brought to the operating room by the anesthesia team. MAC anesthesia was induced. The patient remained in the supine position. The patient's right upper extremity was prepared with Betadine scrub and Betadine solution. Sterile drapes were applied.  I then injected the area to be incised with Marcaine solution. I then used a scalpel to make an incision in the palmar crease. I then dissected with the scalpel deeper and incised the superficial fascia. We inserted the Weitlanter retractor for exposure. We then identified the transverse carpal ligament. I carefully incised with a 15 blade scalpel. We then identified the median nerve. I used the Metzenbaum scissors to divide the  transverse carpal ligament both proximally and distally staying along the ulnar aspect of the median nerve. We got a good decompression of the nerve.  I then obtained hemostasis using bipolar electrocautery. We then irrigated the wound out with bacitracin solution. I then removed the retractor. We then reapproximated patient's subcutaneous tissue with interrupted 3-0 Vicryl suture. The skin was reapproximated with a running 3-0 nylon suture. The wound was then covered  with bacitracin ointment. A sterile dressing was applied. The drapes were removed. Patient was subsequently transported to the post anesthesia care unit in stable condition. All sponge instrument and needle counts were reportedly correct at the end of this case.

## 2014-06-12 NOTE — Anesthesia Postprocedure Evaluation (Signed)
  Anesthesia Post-op Note  Patient: Tracy Peters  Procedure(s) Performed: Procedure(s) with comments: CARPAL TUNNEL RELEASE (Right) - Right Carpal Tunnel Release  Patient Location: PACU  Anesthesia Type:MAC  Level of Consciousness: awake, alert , oriented and patient cooperative  Airway and Oxygen Therapy: Patient Spontanous Breathing  Post-op Pain: none  Post-op Assessment: Post-op Vital signs reviewed, Patient's Cardiovascular Status Stable, Respiratory Function Stable, Patent Airway, No signs of Nausea or vomiting and Pain level controlled  Post-op Vital Signs: stable  Last Vitals:  Filed Vitals:   06/12/14 0601  BP: 142/58  Pulse: 73  Resp: 18    Complications: No apparent anesthesia complications

## 2014-06-12 NOTE — Progress Notes (Signed)
Subjective:  The patient is alert and pleasant. She is in no apparent distress. She looks well.  Objective: Vital signs in last 24 hours: Temp:  [97.7 F (36.5 C)] 97.7 F (36.5 C) (12/28 0830) Pulse Rate:  [70-73] 70 (12/28 0835) Resp:  [11-26] 11 (12/28 0835) BP: (128-142)/(58-63) 128/63 mmHg (12/28 0835) SpO2:  [97 %-99 %] 99 % (12/28 0835)  Intake/Output from previous day:   Intake/Output this shift: Total I/O In: 400 [I.V.:400] Out: -   Physical exam the patient is alert and oriented. She is moving all 4 extremities well. Her dressing is clean and dry.  Lab Results: No results for input(s): WBC, HGB, HCT, PLT in the last 72 hours. BMET No results for input(s): NA, K, CL, CO2, GLUCOSE, BUN, CREATININE, CALCIUM in the last 72 hours.  Studies/Results: No results found.  Assessment/Plan: The patient is doing well. She will be discharged from the PACU. I gave the patient, and her husband, discharge instructions and answered all their questions.  LOS: 0 days     Karstyn Birkey D 06/12/2014, 8:44 AM

## 2014-06-13 ENCOUNTER — Encounter (HOSPITAL_COMMUNITY): Payer: Self-pay | Admitting: Neurosurgery

## 2014-06-19 ENCOUNTER — Other Ambulatory Visit (HOSPITAL_BASED_OUTPATIENT_CLINIC_OR_DEPARTMENT_OTHER): Payer: 59

## 2014-06-19 ENCOUNTER — Ambulatory Visit (HOSPITAL_BASED_OUTPATIENT_CLINIC_OR_DEPARTMENT_OTHER): Payer: 59 | Admitting: Pharmacist

## 2014-06-19 DIAGNOSIS — Z7901 Long term (current) use of anticoagulants: Secondary | ICD-10-CM

## 2014-06-19 DIAGNOSIS — I748 Embolism and thrombosis of other arteries: Secondary | ICD-10-CM

## 2014-06-19 LAB — POCT INR: INR: 1.3

## 2014-06-19 LAB — PROTIME-INR
INR: 1.3 — AB (ref 2.00–3.50)
Protime: 15.6 Seconds — ABNORMAL HIGH (ref 10.6–13.4)

## 2014-06-19 NOTE — Progress Notes (Signed)
INR = 1.3 on Coumadin 7.5 mg daily except 10 mg Mondays (she has not had a 10 mg dose yet post-op).  She is on Lovenox 100 mg SQ daily until INR back at goal. S/p carpal tunnel release 06/12/14 on R hand.  She is recovering nicely.  She has regained feeling in 4/5 fingers.  Incision clean.  She is wearing a glove on R hand today to remind her not to use that hand. She has post-op f/u w/ Dr. Arnoldo Morale on 06/26/14 at 3:30 pm.  She will get her stitches out that day. INR subtherapeutic.  Increase Coumadin to 7.5 mg daily except 10 mg Mon/Wed.  She required this dose when she had neck surgery in Aug to get her INR to goal. Return in 1 week for next protime. I provided her w/ 4 additional Lovenox 100 mg syringes today. Kennith Center, Pharm.D., CPP 06/19/2014@11 :44 AM

## 2014-06-26 ENCOUNTER — Ambulatory Visit (HOSPITAL_BASED_OUTPATIENT_CLINIC_OR_DEPARTMENT_OTHER): Payer: 59 | Admitting: Pharmacist

## 2014-06-26 ENCOUNTER — Other Ambulatory Visit (HOSPITAL_BASED_OUTPATIENT_CLINIC_OR_DEPARTMENT_OTHER): Payer: 59

## 2014-06-26 DIAGNOSIS — Z7901 Long term (current) use of anticoagulants: Secondary | ICD-10-CM | POA: Diagnosis not present

## 2014-06-26 DIAGNOSIS — I748 Embolism and thrombosis of other arteries: Secondary | ICD-10-CM | POA: Diagnosis not present

## 2014-06-26 LAB — POCT INR: INR: 2

## 2014-06-26 LAB — PROTIME-INR
INR: 2 (ref 2.00–3.50)
Protime: 24 Seconds — ABNORMAL HIGH (ref 10.6–13.4)

## 2014-06-26 NOTE — Progress Notes (Signed)
INR = 2    Goal 2-3 INR within goal range. No complications of anticoagulation noted. No new medications. It has now been 2 weeks since wrist surgery, stitches come out this afternoon. Mrs. Micheli states that while she enjoys seeing [name], it is inconvenient to come often to clinic. I will check with Dr. Beryle Beams to see if she is a candidate for home INR monitoring. If so, we will need to see if this would be covered by her insurance (primary is [facility], secondary is Medicare). She will stop Lovenox. She will continue Coumadin to 7.5 mg daily except 10 mg on Mondays & Wednesdays.  Return on 07/11/14 for lab at 10:30 and Coumadin clinic at 10:45.  Theone Murdoch, PharmD

## 2014-07-11 ENCOUNTER — Other Ambulatory Visit (HOSPITAL_BASED_OUTPATIENT_CLINIC_OR_DEPARTMENT_OTHER): Payer: 59

## 2014-07-11 ENCOUNTER — Telehealth: Payer: Self-pay | Admitting: *Deleted

## 2014-07-11 ENCOUNTER — Ambulatory Visit (HOSPITAL_BASED_OUTPATIENT_CLINIC_OR_DEPARTMENT_OTHER): Payer: 59 | Admitting: Pharmacist

## 2014-07-11 DIAGNOSIS — Z7901 Long term (current) use of anticoagulants: Secondary | ICD-10-CM | POA: Diagnosis not present

## 2014-07-11 LAB — PROTIME-INR
INR: 2 (ref 2.00–3.50)
Protime: 24 Seconds — ABNORMAL HIGH (ref 10.6–13.4)

## 2014-07-11 LAB — POCT INR: INR: 2

## 2014-07-11 NOTE — Progress Notes (Addendum)
INR = 2 on coumadin 7.5 mg daily except 10 mg Mon/Wed. She is doing well on current dose of Coumadin w/o complaints. Pt has decided not to pursue the home INR monitor. She sees Dr. Alain Marion tomorrow and will call us if he makes med changes. She is due to see Dr. Beryle Beams on 08/01/14.   INR at goal.  No change to dose of Coumadin necessary. I've left a voicemail w/ Holley Raring, RN for Dr. Beryle Beams to see if pt can have protime drawn there at [facility] clinic on 08/01/14. We'll call pt on 2/16 once we've received results. Kennith Center, Pharm.D., CPP 07/11/2014@11 :47 AM

## 2014-07-11 NOTE — Telephone Encounter (Signed)
Call from Kennith Center at the [facility] - requesting Pt/INR be drawn at her appt with Dr Beryle Beams on Feb 16.

## 2014-07-12 ENCOUNTER — Ambulatory Visit (INDEPENDENT_AMBULATORY_CARE_PROVIDER_SITE_OTHER): Payer: 59 | Admitting: Internal Medicine

## 2014-07-12 ENCOUNTER — Other Ambulatory Visit: Payer: Self-pay | Admitting: Oncology

## 2014-07-12 ENCOUNTER — Encounter: Payer: Self-pay | Admitting: Internal Medicine

## 2014-07-12 VITALS — BP 168/82 | HR 82 | Temp 98.1°F | Wt 136.0 lb

## 2014-07-12 DIAGNOSIS — I742 Embolism and thrombosis of arteries of the upper extremities: Secondary | ICD-10-CM

## 2014-07-12 DIAGNOSIS — Z0000 Encounter for general adult medical examination without abnormal findings: Secondary | ICD-10-CM | POA: Diagnosis not present

## 2014-07-12 DIAGNOSIS — Z7901 Long term (current) use of anticoagulants: Secondary | ICD-10-CM

## 2014-07-12 MED ORDER — VALACYCLOVIR HCL 1 G PO TABS: 1000.0000 mg | ORAL_TABLET | Freq: Two times a day (BID) | ORAL | Status: DC

## 2014-07-12 MED ORDER — WARFARIN SODIUM 5 MG PO TABS: ORAL_TABLET | ORAL | Status: DC

## 2014-07-12 MED ORDER — LOSARTAN POTASSIUM 100 MG PO TABS: 50.0000 mg | ORAL_TABLET | Freq: Every day | ORAL | Status: DC

## 2014-07-12 MED ORDER — ZOLPIDEM TARTRATE 10 MG PO TABS: 10.0000 mg | ORAL_TABLET | Freq: Every evening | ORAL | Status: DC | PRN

## 2014-07-12 MED ORDER — ACYCLOVIR 5 % EX OINT
1.0000 | TOPICAL_OINTMENT | Freq: Every day | CUTANEOUS | Status: DC | PRN
Start: 2014-07-12 — End: 2017-06-18

## 2014-07-12 MED ORDER — TRAMADOL HCL 50 MG PO TABS: 50.0000 mg | ORAL_TABLET | Freq: Two times a day (BID) | ORAL | Status: DC | PRN

## 2014-07-12 MED ORDER — EPINEPHRINE 0.3 MG/0.3ML IJ SOAJ: INTRAMUSCULAR | Status: DC

## 2014-07-12 NOTE — Assessment & Plan Note (Signed)
Here for medicare wellness/physical  Diet: heart healthy  Physical activity: active Depression/mood screen: negative  Hearing: intact to whispered voice  Visual acuity: grossly normal, performs annual eye exam  ADLs: capable  Fall risk: none  Home safety: good  Cognitive evaluation: intact to orientation, naming, recall and repetition  EOL planning: adv directives, full code/ I agree  I have personally reviewed and have noted  1. The patient's medical and social history  2. Their use of alcohol, tobacco or illicit drugs  3. Their current medications and supplements  4. The patient's functional ability including ADL's, fall risks, home safety risks and hearing or visual impairment.  5. Diet and physical activities  6. Evidence for depression or mood disorders    Today patient counseled on age appropriate routine health concerns for screening and prevention, each reviewed and up to date or declined. Immunizations reviewed and up to date or declined. Labs ordered and reviewed. Risk factors for depression reviewed and negative. Hearing function and visual acuity are intact. ADLs screened and addressed as needed. Functional ability and level of safety reviewed and appropriate. Education, counseling and referrals performed based on assessed risks today. Patient provided with a copy of personalized plan for preventive services.     

## 2014-07-12 NOTE — Progress Notes (Signed)
   Subjective:    HPI  The patient is here for a wellness exam. The patient has been doing well overall without major physical or psychological issues going on lately: R hand CTS surgery in 12/15  The patient is here to follow up on chronic depression, anxiety, headaches and chronic moderate fibromyalgia symptoms controlled with medicines, diet and exercise. BP is OK at home    Wt Readings from Last 3 Encounters:  07/12/14 136 lb (61.689 kg)  06/01/14 137 lb 8 oz (62.37 kg)  05/03/14 138 lb (62.596 kg)   BP Readings from Last 3 Encounters:  07/12/14 168/82  06/12/14 152/67  06/01/14 159/69      Review of Systems  Constitutional: Negative for chills, activity change, appetite change, fatigue and unexpected weight change.  HENT: Negative for congestion, mouth sores and sinus pressure.   Eyes: Negative for visual disturbance.  Respiratory: Negative for cough and chest tightness.   Gastrointestinal: Negative for nausea and abdominal pain.  Genitourinary: Negative for frequency, difficulty urinating and vaginal pain.  Musculoskeletal: Negative for back pain and gait problem.  Skin: Negative for pallor and rash.  Neurological: Negative for dizziness, tremors, weakness, numbness and headaches.  Psychiatric/Behavioral: Negative for confusion and sleep disturbance.       Objective:   Physical Exam  Constitutional: She appears well-developed and well-nourished. No distress.  HENT:  Head: Normocephalic.  Right Ear: External ear normal.  Left Ear: External ear normal.  Nose: Nose normal.  Mouth/Throat: Oropharynx is clear and moist.  Eyes: Conjunctivae are normal. Pupils are equal, round, and reactive to light. Right eye exhibits no discharge. Left eye exhibits no discharge.  Neck: Normal range of motion. Neck supple. No JVD present. No tracheal deviation present. No thyromegaly present.  Cardiovascular: Normal rate, regular rhythm and normal heart sounds.   Pulmonary/Chest: No  stridor. No respiratory distress. She has no wheezes.  Abdominal: Soft. Bowel sounds are normal. She exhibits no distension and no mass. There is no tenderness. There is no rebound and no guarding.  Musculoskeletal: She exhibits no edema or tenderness.  Lymphadenopathy:    She has no cervical adenopathy.  Neurological: She displays normal reflexes. No cranial nerve deficit. She exhibits normal muscle tone. Coordination normal.  Skin: No rash noted. No erythema.  Psychiatric: She has a normal mood and affect. Her behavior is normal. Judgment and thought content normal.  R palm scar  Lab Results  Component Value Date   WBC 7.8 06/01/2014   HGB 12.2 06/01/2014   HCT 35.8* 06/01/2014   PLT 269 06/01/2014   GLUCOSE 92 06/01/2014   CHOL 209* 05/31/2012   TRIG 101.0 05/31/2012   HDL 61.50 05/31/2012   LDLDIRECT 129.8 05/31/2012   LDLCALC 115* 11/19/2010   ALT 15 03/21/2014   AST 22 03/21/2014   NA 134* 06/01/2014   K 4.7 06/01/2014   CL 96 06/01/2014   CREATININE 0.84 06/01/2014   BUN 14 06/01/2014   CO2 26 06/01/2014   TSH 4.34 05/03/2014   INR 2.00 07/11/2014         Assessment & Plan:

## 2014-07-12 NOTE — Progress Notes (Signed)
Pre visit review using our clinic review tool, if applicable. No additional management support is needed unless otherwise documented below in the visit note. 

## 2014-07-12 NOTE — Patient Instructions (Signed)
Preventive Care for Adults A healthy lifestyle and preventive care can promote health and wellness. Preventive health guidelines for women include the following key practices.  A routine yearly physical is a good way to check with your health care provider about your health and preventive screening. It is a chance to share any concerns and updates on your health and to receive a thorough exam.  Visit your dentist for a routine exam and preventive care every 6 months. Brush your teeth twice a day and floss once a day. Good oral hygiene prevents tooth decay and gum disease.  The frequency of eye exams is based on your age, health, family medical history, use of contact lenses, and other factors. Follow your health care provider's recommendations for frequency of eye exams.  Eat a healthy diet. Foods like vegetables, fruits, whole grains, low-fat dairy products, and lean protein foods contain the nutrients you need without too many calories. Decrease your intake of foods high in solid fats, added sugars, and salt. Eat the right amount of calories for you.Get information about a proper diet from your health care provider, if necessary.  Regular physical exercise is one of the most important things you can do for your health. Most adults should get at least 150 minutes of moderate-intensity exercise (any activity that increases your heart rate and causes you to sweat) each week. In addition, most adults need muscle-strengthening exercises on 2 or more days a week.  Maintain a healthy weight. The body mass index (BMI) is a screening tool to identify possible weight problems. It provides an estimate of body fat based on height and weight. Your health care provider can find your BMI and can help you achieve or maintain a healthy weight.For adults 20 years and older:  A BMI below 18.5 is considered underweight.  A BMI of 18.5 to 24.9 is normal.  A BMI of 25 to 29.9 is considered overweight.  A BMI of  30 and above is considered obese.  Maintain normal blood lipids and cholesterol levels by exercising and minimizing your intake of saturated fat. Eat a balanced diet with plenty of fruit and vegetables. Blood tests for lipids and cholesterol should begin at age 76 and be repeated every 5 years. If your lipid or cholesterol levels are high, you are over 50, or you are at high risk for heart disease, you may need your cholesterol levels checked more frequently.Ongoing high lipid and cholesterol levels should be treated with medicines if diet and exercise are not working.  If you smoke, find out from your health care provider how to quit. If you do not use tobacco, do not start.  Lung cancer screening is recommended for adults aged 22-80 years who are at high risk for developing lung cancer because of a history of smoking. A yearly low-dose CT scan of the lungs is recommended for people who have at least a 30-pack-year history of smoking and are a current smoker or have quit within the past 15 years. A pack year of smoking is smoking an average of 1 pack of cigarettes a day for 1 year (for example: 1 pack a day for 30 years or 2 packs a day for 15 years). Yearly screening should continue until the smoker has stopped smoking for at least 15 years. Yearly screening should be stopped for people who develop a health problem that would prevent them from having lung cancer treatment.  If you are pregnant, do not drink alcohol. If you are breastfeeding,  be very cautious about drinking alcohol. If you are not pregnant and choose to drink alcohol, do not have more than 1 drink per day. One drink is considered to be 12 ounces (355 mL) of beer, 5 ounces (148 mL) of wine, or 1.5 ounces (44 mL) of liquor.  Avoid use of street drugs. Do not share needles with anyone. Ask for help if you need support or instructions about stopping the use of drugs.  High blood pressure causes heart disease and increases the risk of  stroke. Your blood pressure should be checked at least every 1 to 2 years. Ongoing high blood pressure should be treated with medicines if weight loss and exercise do not work.  If you are 75-52 years old, ask your health care provider if you should take aspirin to prevent strokes.  Diabetes screening involves taking a blood sample to check your fasting blood sugar level. This should be done once every 3 years, after age 15, if you are within normal weight and without risk factors for diabetes. Testing should be considered at a younger age or be carried out more frequently if you are overweight and have at least 1 risk factor for diabetes.  Breast cancer screening is essential preventive care for women. You should practice "breast self-awareness." This means understanding the normal appearance and feel of your breasts and may include breast self-examination. Any changes detected, no matter how small, should be reported to a health care provider. Women in their 58s and 30s should have a clinical breast exam (CBE) by a health care provider as part of a regular health exam every 1 to 3 years. After age 16, women should have a CBE every year. Starting at age 53, women should consider having a mammogram (breast X-ray test) every year. Women who have a family history of breast cancer should talk to their health care provider about genetic screening. Women at a high risk of breast cancer should talk to their health care providers about having an MRI and a mammogram every year.  Breast cancer gene (BRCA)-related cancer risk assessment is recommended for women who have family members with BRCA-related cancers. BRCA-related cancers include breast, ovarian, tubal, and peritoneal cancers. Having family members with these cancers may be associated with an increased risk for harmful changes (mutations) in the breast cancer genes BRCA1 and BRCA2. Results of the assessment will determine the need for genetic counseling and  BRCA1 and BRCA2 testing.  Routine pelvic exams to screen for cancer are no longer recommended for nonpregnant women who are considered low risk for cancer of the pelvic organs (ovaries, uterus, and vagina) and who do not have symptoms. Ask your health care provider if a screening pelvic exam is right for you.  If you have had past treatment for cervical cancer or a condition that could lead to cancer, you need Pap tests and screening for cancer for at least 20 years after your treatment. If Pap tests have been discontinued, your risk factors (such as having a new sexual partner) need to be reassessed to determine if screening should be resumed. Some women have medical problems that increase the chance of getting cervical cancer. In these cases, your health care provider may recommend more frequent screening and Pap tests.  The HPV test is an additional test that may be used for cervical cancer screening. The HPV test looks for the virus that can cause the cell changes on the cervix. The cells collected during the Pap test can be  tested for HPV. The HPV test could be used to screen women aged 30 years and older, and should be used in women of any age who have unclear Pap test results. After the age of 30, women should have HPV testing at the same frequency as a Pap test.  Colorectal cancer can be detected and often prevented. Most routine colorectal cancer screening begins at the age of 50 years and continues through age 75 years. However, your health care provider may recommend screening at an earlier age if you have risk factors for colon cancer. On a yearly basis, your health care provider may provide home test kits to check for hidden blood in the stool. Use of a small camera at the end of a tube, to directly examine the colon (sigmoidoscopy or colonoscopy), can detect the earliest forms of colorectal cancer. Talk to your health care provider about this at age 50, when routine screening begins. Direct  exam of the colon should be repeated every 5-10 years through age 75 years, unless early forms of pre-cancerous polyps or small growths are found.  People who are at an increased risk for hepatitis B should be screened for this virus. You are considered at high risk for hepatitis B if:  You were born in a country where hepatitis B occurs often. Talk with your health care provider about which countries are considered high risk.  Your parents were born in a high-risk country and you have not received a shot to protect against hepatitis B (hepatitis B vaccine).  You have HIV or AIDS.  You use needles to inject street drugs.  You live with, or have sex with, someone who has hepatitis B.  You get hemodialysis treatment.  You take certain medicines for conditions like cancer, organ transplantation, and autoimmune conditions.  Hepatitis C blood testing is recommended for all people born from 1945 through 1965 and any individual with known risks for hepatitis C.  Practice safe sex. Use condoms and avoid high-risk sexual practices to reduce the spread of sexually transmitted infections (STIs). STIs include gonorrhea, chlamydia, syphilis, trichomonas, herpes, HPV, and human immunodeficiency virus (HIV). Herpes, HIV, and HPV are viral illnesses that have no cure. They can result in disability, cancer, and death.  You should be screened for sexually transmitted illnesses (STIs) including gonorrhea and chlamydia if:  You are sexually active and are younger than 24 years.  You are older than 24 years and your health care provider tells you that you are at risk for this type of infection.  Your sexual activity has changed since you were last screened and you are at an increased risk for chlamydia or gonorrhea. Ask your health care provider if you are at risk.  If you are at risk of being infected with HIV, it is recommended that you take a prescription medicine daily to prevent HIV infection. This is  called preexposure prophylaxis (PrEP). You are considered at risk if:  You are a heterosexual woman, are sexually active, and are at increased risk for HIV infection.  You take drugs by injection.  You are sexually active with a partner who has HIV.  Talk with your health care provider about whether you are at high risk of being infected with HIV. If you choose to begin PrEP, you should first be tested for HIV. You should then be tested every 3 months for as long as you are taking PrEP.  Osteoporosis is a disease in which the bones lose minerals and strength   with aging. This can result in serious bone fractures or breaks. The risk of osteoporosis can be identified using a bone density scan. Women ages 65 years and over and women at risk for fractures or osteoporosis should discuss screening with their health care providers. Ask your health care provider whether you should take a calcium supplement or vitamin D to reduce the rate of osteoporosis.  Menopause can be associated with physical symptoms and risks. Hormone replacement therapy is available to decrease symptoms and risks. You should talk to your health care provider about whether hormone replacement therapy is right for you.  Use sunscreen. Apply sunscreen liberally and repeatedly throughout the day. You should seek shade when your shadow is shorter than you. Protect yourself by wearing long sleeves, pants, a wide-brimmed hat, and sunglasses year round, whenever you are outdoors.  Once a month, do a whole body skin exam, using a mirror to look at the skin on your back. Tell your health care provider of new moles, moles that have irregular borders, moles that are larger than a pencil eraser, or moles that have changed in shape or color.  Stay current with required vaccines (immunizations).  Influenza vaccine. All adults should be immunized every year.  Tetanus, diphtheria, and acellular pertussis (Td, Tdap) vaccine. Pregnant women should  receive 1 dose of Tdap vaccine during each pregnancy. The dose should be obtained regardless of the length of time since the last dose. Immunization is preferred during the 27th-36th week of gestation. An adult who has not previously received Tdap or who does not know her vaccine status should receive 1 dose of Tdap. This initial dose should be followed by tetanus and diphtheria toxoids (Td) booster doses every 10 years. Adults with an unknown or incomplete history of completing a 3-dose immunization series with Td-containing vaccines should begin or complete a primary immunization series including a Tdap dose. Adults should receive a Td booster every 10 years.  Varicella vaccine. An adult without evidence of immunity to varicella should receive 2 doses or a second dose if she has previously received 1 dose. Pregnant females who do not have evidence of immunity should receive the first dose after pregnancy. This first dose should be obtained before leaving the health care facility. The second dose should be obtained 4-8 weeks after the first dose.  Human papillomavirus (HPV) vaccine. Females aged 13-26 years who have not received the vaccine previously should obtain the 3-dose series. The vaccine is not recommended for use in pregnant females. However, pregnancy testing is not needed before receiving a dose. If a female is found to be pregnant after receiving a dose, no treatment is needed. In that case, the remaining doses should be delayed until after the pregnancy. Immunization is recommended for any person with an immunocompromised condition through the age of 26 years if she did not get any or all doses earlier. During the 3-dose series, the second dose should be obtained 4-8 weeks after the first dose. The third dose should be obtained 24 weeks after the first dose and 16 weeks after the second dose.  Zoster vaccine. One dose is recommended for adults aged 60 years or older unless certain conditions are  present.  Measles, mumps, and rubella (MMR) vaccine. Adults born before 1957 generally are considered immune to measles and mumps. Adults born in 1957 or later should have 1 or more doses of MMR vaccine unless there is a contraindication to the vaccine or there is laboratory evidence of immunity to   each of the three diseases. A routine second dose of MMR vaccine should be obtained at least 28 days after the first dose for students attending postsecondary schools, health care workers, or international travelers. People who received inactivated measles vaccine or an unknown type of measles vaccine during 1963-1967 should receive 2 doses of MMR vaccine. People who received inactivated mumps vaccine or an unknown type of mumps vaccine before 1979 and are at high risk for mumps infection should consider immunization with 2 doses of MMR vaccine. For females of childbearing age, rubella immunity should be determined. If there is no evidence of immunity, females who are not pregnant should be vaccinated. If there is no evidence of immunity, females who are pregnant should delay immunization until after pregnancy. Unvaccinated health care workers born before 1957 who lack laboratory evidence of measles, mumps, or rubella immunity or laboratory confirmation of disease should consider measles and mumps immunization with 2 doses of MMR vaccine or rubella immunization with 1 dose of MMR vaccine.  Pneumococcal 13-valent conjugate (PCV13) vaccine. When indicated, a person who is uncertain of her immunization history and has no record of immunization should receive the PCV13 vaccine. An adult aged 19 years or older who has certain medical conditions and has not been previously immunized should receive 1 dose of PCV13 vaccine. This PCV13 should be followed with a dose of pneumococcal polysaccharide (PPSV23) vaccine. The PPSV23 vaccine dose should be obtained at least 8 weeks after the dose of PCV13 vaccine. An adult aged 19  years or older who has certain medical conditions and previously received 1 or more doses of PPSV23 vaccine should receive 1 dose of PCV13. The PCV13 vaccine dose should be obtained 1 or more years after the last PPSV23 vaccine dose.  Pneumococcal polysaccharide (PPSV23) vaccine. When PCV13 is also indicated, PCV13 should be obtained first. All adults aged 65 years and older should be immunized. An adult younger than age 65 years who has certain medical conditions should be immunized. Any person who resides in a nursing home or long-term care facility should be immunized. An adult smoker should be immunized. People with an immunocompromised condition and certain other conditions should receive both PCV13 and PPSV23 vaccines. People with human immunodeficiency virus (HIV) infection should be immunized as soon as possible after diagnosis. Immunization during chemotherapy or radiation therapy should be avoided. Routine use of PPSV23 vaccine is not recommended for American Indians, [place] Natives, or people younger than 65 years unless there are medical conditions that require PPSV23 vaccine. When indicated, people who have unknown immunization and have no record of immunization should receive PPSV23 vaccine. One-time revaccination 5 years after the first dose of PPSV23 is recommended for people aged 19-64 years who have chronic kidney failure, nephrotic syndrome, asplenia, or immunocompromised conditions. People who received 1-2 doses of PPSV23 before age 65 years should receive another dose of PPSV23 vaccine at age 65 years or later if at least 5 years have passed since the previous dose. Doses of PPSV23 are not needed for people immunized with PPSV23 at or after age 65 years.  Meningococcal vaccine. Adults with asplenia or persistent complement component deficiencies should receive 2 doses of quadrivalent meningococcal conjugate (MenACWY-D) vaccine. The doses should be obtained at least 2 months apart.  Microbiologists working with certain meningococcal bacteria, military recruits, people at risk during an outbreak, and people who travel to or live in countries with a high rate of meningitis should be immunized. A first-year college student up through age   21 years who is living in a residence hall should receive a dose if she did not receive a dose on or after her 16th birthday. Adults who have certain high-risk conditions should receive one or more doses of vaccine.  Hepatitis A vaccine. Adults who wish to be protected from this disease, have certain high-risk conditions, work with hepatitis A-infected animals, work in hepatitis A research labs, or travel to or work in countries with a high rate of hepatitis A should be immunized. Adults who were previously unvaccinated and who anticipate close contact with an international adoptee during the first 60 days after arrival in the [place] from a country with a high rate of hepatitis A should be immunized.  Hepatitis B vaccine. Adults who wish to be protected from this disease, have certain high-risk conditions, may be exposed to blood or other infectious body fluids, are household contacts or sex partners of hepatitis B positive people, are clients or workers in certain care facilities, or travel to or work in countries with a high rate of hepatitis B should be immunized.  Haemophilus influenzae type b (Hib) vaccine. A previously unvaccinated person with asplenia or sickle cell disease or having a scheduled splenectomy should receive 1 dose of Hib vaccine. Regardless of previous immunization, a recipient of a hematopoietic stem cell transplant should receive a 3-dose series 6-12 months after her successful transplant. Hib vaccine is not recommended for adults with HIV infection. Preventive Services / Frequency Ages 64 to 68 years  Blood pressure check.** / Every 1 to 2 years.  Lipid and cholesterol check.** / Every 5 years beginning at age  22.  Clinical breast exam.** / Every 3 years for women in their 88s and 53s.  BRCA-related cancer risk assessment.** / For women who have family members with a BRCA-related cancer (breast, ovarian, tubal, or peritoneal cancers).  Pap test.** / Every 2 years from ages 90 through 51. Every 3 years starting at age 21 through age 56 or 3 with a history of 3 consecutive normal Pap tests.  HPV screening.** / Every 3 years from ages 24 through ages 1 to 46 with a history of 3 consecutive normal Pap tests.  Hepatitis C blood test.** / For any individual with known risks for hepatitis C.  Skin self-exam. / Monthly.  Influenza vaccine. / Every year.  Tetanus, diphtheria, and acellular pertussis (Tdap, Td) vaccine.** / Consult your health care provider. Pregnant women should receive 1 dose of Tdap vaccine during each pregnancy. 1 dose of Td every 10 years.  Varicella vaccine.** / Consult your health care provider. Pregnant females who do not have evidence of immunity should receive the first dose after pregnancy.  HPV vaccine. / 3 doses over 6 months, if 72 and younger. The vaccine is not recommended for use in pregnant females. However, pregnancy testing is not needed before receiving a dose.  Measles, mumps, rubella (MMR) vaccine.** / You need at least 1 dose of MMR if you were born in 1957 or later. You may also need a 2nd dose. For females of childbearing age, rubella immunity should be determined. If there is no evidence of immunity, females who are not pregnant should be vaccinated. If there is no evidence of immunity, females who are pregnant should delay immunization until after pregnancy.  Pneumococcal 13-valent conjugate (PCV13) vaccine.** / Consult your health care provider.  Pneumococcal polysaccharide (PPSV23) vaccine.** / 1 to 2 doses if you smoke cigarettes or if you have certain conditions.  Meningococcal vaccine.** /  1 dose if you are age 19 to 21 years and a first-year college  student living in a residence hall, or have one of several medical conditions, you need to get vaccinated against meningococcal disease. You may also need additional booster doses.  Hepatitis A vaccine.** / Consult your health care provider.  Hepatitis B vaccine.** / Consult your health care provider.  Haemophilus influenzae type b (Hib) vaccine.** / Consult your health care provider. Ages 40 to 64 years  Blood pressure check.** / Every 1 to 2 years.  Lipid and cholesterol check.** / Every 5 years beginning at age 20 years.  Lung cancer screening. / Every year if you are aged 55-80 years and have a 30-pack-year history of smoking and currently smoke or have quit within the past 15 years. Yearly screening is stopped once you have quit smoking for at least 15 years or develop a health problem that would prevent you from having lung cancer treatment.  Clinical breast exam.** / Every year after age 40 years.  BRCA-related cancer risk assessment.** / For women who have family members with a BRCA-related cancer (breast, ovarian, tubal, or peritoneal cancers).  Mammogram.** / Every year beginning at age 40 years and continuing for as long as you are in good health. Consult with your health care provider.  Pap test.** / Every 3 years starting at age 30 years through age 65 or 70 years with a history of 3 consecutive normal Pap tests.  HPV screening.** / Every 3 years from ages 30 years through ages 65 to 70 years with a history of 3 consecutive normal Pap tests.  Fecal occult blood test (FOBT) of stool. / Every year beginning at age 50 years and continuing until age 75 years. You may not need to do this test if you get a colonoscopy every 10 years.  Flexible sigmoidoscopy or colonoscopy.** / Every 5 years for a flexible sigmoidoscopy or every 10 years for a colonoscopy beginning at age 50 years and continuing until age 75 years.  Hepatitis C blood test.** / For all people born from 1945 through  1965 and any individual with known risks for hepatitis C.  Skin self-exam. / Monthly.  Influenza vaccine. / Every year.  Tetanus, diphtheria, and acellular pertussis (Tdap/Td) vaccine.** / Consult your health care provider. Pregnant women should receive 1 dose of Tdap vaccine during each pregnancy. 1 dose of Td every 10 years.  Varicella vaccine.** / Consult your health care provider. Pregnant females who do not have evidence of immunity should receive the first dose after pregnancy.  Zoster vaccine.** / 1 dose for adults aged 60 years or older.  Measles, mumps, rubella (MMR) vaccine.** / You need at least 1 dose of MMR if you were born in 1957 or later. You may also need a 2nd dose. For females of childbearing age, rubella immunity should be determined. If there is no evidence of immunity, females who are not pregnant should be vaccinated. If there is no evidence of immunity, females who are pregnant should delay immunization until after pregnancy.  Pneumococcal 13-valent conjugate (PCV13) vaccine.** / Consult your health care provider.  Pneumococcal polysaccharide (PPSV23) vaccine.** / 1 to 2 doses if you smoke cigarettes or if you have certain conditions.  Meningococcal vaccine.** / Consult your health care provider.  Hepatitis A vaccine.** / Consult your health care provider.  Hepatitis B vaccine.** / Consult your health care provider.  Haemophilus influenzae type b (Hib) vaccine.** / Consult your health care provider. Ages 65   years and over  Blood pressure check.** / Every 1 to 2 years.  Lipid and cholesterol check.** / Every 5 years beginning at age 22 years.  Lung cancer screening. / Every year if you are aged 73-80 years and have a 30-pack-year history of smoking and currently smoke or have quit within the past 15 years. Yearly screening is stopped once you have quit smoking for at least 15 years or develop a health problem that would prevent you from having lung cancer  treatment.  Clinical breast exam.** / Every year after age 4 years.  BRCA-related cancer risk assessment.** / For women who have family members with a BRCA-related cancer (breast, ovarian, tubal, or peritoneal cancers).  Mammogram.** / Every year beginning at age 40 years and continuing for as long as you are in good health. Consult with your health care provider.  Pap test.** / Every 3 years starting at age 9 years through age 34 or 91 years with 3 consecutive normal Pap tests. Testing can be stopped between 65 and 70 years with 3 consecutive normal Pap tests and no abnormal Pap or HPV tests in the past 10 years.  HPV screening.** / Every 3 years from ages 57 years through ages 64 or 45 years with a history of 3 consecutive normal Pap tests. Testing can be stopped between 65 and 70 years with 3 consecutive normal Pap tests and no abnormal Pap or HPV tests in the past 10 years.  Fecal occult blood test (FOBT) of stool. / Every year beginning at age 15 years and continuing until age 17 years. You may not need to do this test if you get a colonoscopy every 10 years.  Flexible sigmoidoscopy or colonoscopy.** / Every 5 years for a flexible sigmoidoscopy or every 10 years for a colonoscopy beginning at age 86 years and continuing until age 71 years.  Hepatitis C blood test.** / For all people born from [birth year] through 1963 and any individual with known risks for hepatitis C.  Osteoporosis screening.** / A one-time screening for women ages 83 years and over and women at risk for fractures or osteoporosis.  Skin self-exam. / Monthly.  Influenza vaccine. / Every year.  Tetanus, diphtheria, and acellular pertussis (Tdap/Td) vaccine.** / 1 dose of Td every 10 years.  Varicella vaccine.** / Consult your health care provider.  Zoster vaccine.** / 1 dose for adults aged 61 years or older.  Pneumococcal 13-valent conjugate (PCV13) vaccine.** / Consult your health care provider.  Pneumococcal  polysaccharide (PPSV23) vaccine.** / 1 dose for all adults aged 28 years and older.  Meningococcal vaccine.** / Consult your health care provider.  Hepatitis A vaccine.** / Consult your health care provider.  Hepatitis B vaccine.** / Consult your health care provider.  Haemophilus influenzae type b (Hib) vaccine.** / Consult your health care provider. ** Family history and personal history of risk and conditions may change your health care provider's recommendations. Document Released: 07/29/2001 Document Revised: 10/17/2013 Document Reviewed: 10/28/2010 [facility] Patient Information 2015 [place], [place]. This information is not intended to replace advice given to you by your health care provider. Make sure you discuss any questions you have with your health care provider.

## 2014-07-25 ENCOUNTER — Ambulatory Visit: Payer: 59

## 2014-07-25 ENCOUNTER — Other Ambulatory Visit: Payer: 59

## 2014-07-25 ENCOUNTER — Other Ambulatory Visit: Payer: Self-pay | Admitting: Obstetrics and Gynecology

## 2014-07-25 DIAGNOSIS — N760 Acute vaginitis: Secondary | ICD-10-CM | POA: Diagnosis not present

## 2014-07-25 DIAGNOSIS — Z1231 Encounter for screening mammogram for malignant neoplasm of breast: Secondary | ICD-10-CM | POA: Diagnosis not present

## 2014-07-25 DIAGNOSIS — Z6824 Body mass index (BMI) 24.0-24.9, adult: Secondary | ICD-10-CM | POA: Diagnosis not present

## 2014-07-25 DIAGNOSIS — Z01419 Encounter for gynecological examination (general) (routine) without abnormal findings: Secondary | ICD-10-CM | POA: Diagnosis not present

## 2014-07-26 LAB — CYTOLOGY - PAP

## 2014-08-01 ENCOUNTER — Telehealth: Payer: Self-pay | Admitting: Pharmacist

## 2014-08-01 ENCOUNTER — Ambulatory Visit (INDEPENDENT_AMBULATORY_CARE_PROVIDER_SITE_OTHER): Payer: 59 | Admitting: Oncology

## 2014-08-01 ENCOUNTER — Encounter: Payer: Self-pay | Admitting: Oncology

## 2014-08-01 VITALS — BP 155/69 | HR 86 | Temp 97.9°F | Ht 65.0 in | Wt 135.4 lb

## 2014-08-01 DIAGNOSIS — M47892 Other spondylosis, cervical region: Secondary | ICD-10-CM

## 2014-08-01 DIAGNOSIS — I10 Essential (primary) hypertension: Secondary | ICD-10-CM

## 2014-08-01 DIAGNOSIS — I742 Embolism and thrombosis of arteries of the upper extremities: Secondary | ICD-10-CM

## 2014-08-01 DIAGNOSIS — Z9889 Other specified postprocedural states: Secondary | ICD-10-CM

## 2014-08-01 DIAGNOSIS — Z86718 Personal history of other venous thrombosis and embolism: Secondary | ICD-10-CM | POA: Diagnosis not present

## 2014-08-01 DIAGNOSIS — K589 Irritable bowel syndrome without diarrhea: Secondary | ICD-10-CM | POA: Diagnosis not present

## 2014-08-01 DIAGNOSIS — Z7901 Long term (current) use of anticoagulants: Secondary | ICD-10-CM | POA: Diagnosis not present

## 2014-08-01 DIAGNOSIS — Z981 Arthrodesis status: Secondary | ICD-10-CM | POA: Diagnosis not present

## 2014-08-01 DIAGNOSIS — G5601 Carpal tunnel syndrome, right upper limb: Secondary | ICD-10-CM

## 2014-08-01 DIAGNOSIS — D649 Anemia, unspecified: Secondary | ICD-10-CM

## 2014-08-01 LAB — CBC WITH DIFFERENTIAL/PLATELET
BASOS ABS: 0 10*3/uL (ref 0.0–0.1)
BASOS PCT: 0 % (ref 0–1)
EOS ABS: 0 10*3/uL (ref 0.0–0.7)
Eosinophils Relative: 0 % (ref 0–5)
HCT: 40.5 % (ref 36.0–46.0)
Hemoglobin: 13.5 g/dL (ref 12.0–15.0)
Lymphocytes Relative: 18 % (ref 12–46)
Lymphs Abs: 1.7 10*3/uL (ref 0.7–4.0)
MCH: 36.3 pg — ABNORMAL HIGH (ref 26.0–34.0)
MCHC: 33.3 g/dL (ref 30.0–36.0)
MCV: 108.9 fL — ABNORMAL HIGH (ref 78.0–100.0)
MPV: 9.5 fL (ref 8.6–12.4)
Monocytes Absolute: 0.7 10*3/uL (ref 0.1–1.0)
Monocytes Relative: 7 % (ref 3–12)
NEUTROS PCT: 75 % (ref 43–77)
Neutro Abs: 7.1 10*3/uL (ref 1.7–7.7)
Platelets: 293 10*3/uL (ref 150–400)
RBC: 3.72 MIL/uL — ABNORMAL LOW (ref 3.87–5.11)
RDW: 14.2 % (ref 11.5–15.5)
WBC: 9.5 10*3/uL (ref 4.0–10.5)

## 2014-08-01 LAB — POCT INR: INR: 2.1

## 2014-08-01 NOTE — Progress Notes (Signed)
Patient ID: Tracy Peters, female   DOB: 1933/09/06, 81 y.o.   MRN: [id number] Hematology and Oncology Follow Up Visit  Tracy Peters [id number] 1934/02/27 81 y.o. 08/01/2014 1:32 PM   Principle Diagnosis: Encounter Diagnoses  Name Primary?  . Chronic anticoagulation Yes  . Thrombosis of right radial artery    clinical summary: 81 year old woman I have followed for many years. She sustained an arterial embolus to an index finger of her right hand involving a right radial artery in 1999 which happened coincidentally with starting a Cox 2 inhibitor. She has been maintained on chronic Coumadin anticoagulation since that time and has had no subsequent thrombotic events.  She has known degenerative arthritis of the spine and has had 2 operations in the past. She tells me that her left knee is deteriorating and surgery also recommended on this in the near future.  She has noted low-grade neck pain and intermittent mild paresthesias of her right hand as far back as January of 2015. At the time of a 01/10/14 office visit  she complained of constant and progressive paresthesias in her right hand. No obvious ischemic changes unless her arm dangles for a long time and then she notes a mild bluish discoloration of the fingertips which resolves when she elevates the arm. At time of my office exam on 09/28/2013, she had mild decrease in the strength of her right hand and decreased sensation to pinprick in a glove distribution over her right hand. I obtained an MRI of the cervical spine on April 29. She had multi-level degenerative arthritis, foraminal stenosis at C4-5 and C5-6. I referred her to Dr. Arnoldo Morale who has operated successfully on her daughter in the past. In addition she has developed progressive pain and weakness and is felt to be a candidate for a laminectomy.  She  had successful surgery on her cervical spine by Dr. Newman Pies on January 18, 2014. Anticoagulation was held for the procedure and she  is currently back on her Coumadin. She had residual paresthesias in some of her fingers of the right hand. She was reevaluated by Dr. Arnoldo Morale and found to have carpal tunnel syndrome. She underwent a another surgical procedure on 06/12/2014. She has had significant improvement of the paresthesias now limited just to the middle finger.   Interim History:   See above. Recent right carpal tunnel surgery with significant improvement in paresthesias of her right hand. No other interim medical problems. She denies any dyspnea, chest pain, palpitation,. She is back on her Coumadin. No bleeding. No bruising.   Medications: reviewed  Allergies:  Allergies  Allergen Reactions  . Bee Venom Anaphylaxis    Yellow jackets, wasps as well  . Ivp Dye [Iodinated Diagnostic Agents] Anaphylaxis  . Shellfish Allergy Anaphylaxis  . Amlodipine Besylate     REACTION: swelling  . Aspirin     REACTION: unspecified  . Atorvastatin     REACTION: diarrhea  . Cholestyramine     REACTION: mouth irritation  . Codeine Phosphate Nausea And Vomiting  . Demerol [Meperidine]     Severe Hallucination!!!!  . Diphenhydramine Hcl Other (See Comments)    hyperactive  . Doxycycline     REACTION: nausea  . Gentamicin Sulfate     REACTION: unspecified  . Meclizine Hcl     REACTION: more dizziness on it  . Meperidine Hcl   . Metronidazole     REACTION: bad taste  . Moxifloxacin     REACTION: insomnia Able to use Cipro  .  Sulfamethoxazole     Kidney problems  . Penicillins Swelling and Rash    Review of Systems: See HPI Remaining ROS negative:   Physical Exam: Blood pressure 155/69, pulse 86, temperature 97.9 F (36.6 C), temperature source Oral, height 5\' 5"  (1.651 m), weight 135 lb 6.4 oz (61.417 kg), SpO2 99 %. Wt Readings from Last 3 Encounters:  08/01/14 135 lb 6.4 oz (61.417 kg)  07/12/14 136 lb (61.689 kg)  06/01/14 137 lb 8 oz (62.37 kg)     General appearance: well nourished caucasian  woman HENNT: Pharynx no erythema, exudate, mass, or ulcer. No thyromegaly or thyroid nodules Lymph nodes: No cervical, supraclavicular, or axillary lymphadenopathy Breasts:  Lungs: Clear to auscultation, resonant to percussion throughout Heart: Regular rhythm, no murmur, no gallop, no rub, no click, no edema Abdomen: Soft, nontender, normal bowel sounds, no mass, no organomegaly Extremities: No edema, no calf tenderness Musculoskeletal: no joint deformities GU:  Vascular: Carotid pulses 2+, no bruits, radial & ulnar pulses 2+ symmetric; healing scar right wrist Neurologic: Alert, oriented, PERRLA, optic discs sharp and vessels normal, no hemorrhage or exudate, cranial nerves grossly normal, motor strength 5 over 5, reflexes 1+ symmetric, upper body coordination normal, gait normal, Skin: No rash or ecchymosis  Lab Results: CBC W/Diff    Component Value Date/Time   WBC 7.8 06/01/2014 1331   WBC 6.7 03/21/2014 1058   RBC 3.44* 06/01/2014 1331   RBC 3.51* 03/21/2014 1058   HGB 12.2 06/01/2014 1331   HGB 12.7 03/21/2014 1058   HCT 35.8* 06/01/2014 1331   HCT 38.7 03/21/2014 1058   PLT 269 06/01/2014 1331   PLT 286 03/21/2014 1058   MCV 104.1* 06/01/2014 1331   MCV 110.4* 03/21/2014 1058   MCH 35.5* 06/01/2014 1331   MCH 36.1* 03/21/2014 1058   MCHC 34.1 06/01/2014 1331   MCHC 32.7 03/21/2014 1058   RDW 13.3 06/01/2014 1331   RDW 13.3 03/21/2014 1058   LYMPHSABS 1.8 05/03/2014 0950   LYMPHSABS 1.6 03/21/2014 1058   MONOABS 0.5 05/03/2014 0950   MONOABS 0.6 03/21/2014 1058   EOSABS 0.1 05/03/2014 0950   EOSABS 0.1 03/21/2014 1058   BASOSABS 0.0 05/03/2014 0950   BASOSABS 0.0 03/21/2014 1058     Chemistry      Component Value Date/Time   NA 134* 06/01/2014 1331   NA 132* 03/21/2014 1100   K 4.7 06/01/2014 1331   K 4.9 03/21/2014 1100   CL 96 06/01/2014 1331   CL 97* 03/16/2012 1344   CO2 26 06/01/2014 1331   CO2 26 03/21/2014 1100   BUN 14 06/01/2014 1331   BUN 16.0  03/21/2014 1100   CREATININE 0.84 06/01/2014 1331   CREATININE 0.9 03/21/2014 1100      Component Value Date/Time   CALCIUM 9.9 06/01/2014 1331   CALCIUM 9.9 03/21/2014 1100   ALKPHOS 51 03/21/2014 1100   ALKPHOS 44 05/31/2012 0921   AST 22 03/21/2014 1100   AST 26 05/31/2012 0921   ALT 15 03/21/2014 1100   ALT 22 05/31/2012 0921   BILITOT 0.36 03/21/2014 1100   BILITOT 0.7 05/31/2012 0921    INR: 2.1   Radiological Studies: No results found.  Impression:  #1. 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 No subsequent thrombotic events on long-term Coumadin anticoagulation. Plan continue the same.  #2. Degenerative arthritis of the spine status post cervical discectomy with fusion procedure and plate stabilization C4 through C7 August 2015  #3. Carpal  tunnel syndrome status post right carpal tunnel repair in December 2015  #4. Chronic mild macrocytic anemia with normal I50 and folic acid levels  #5. Essential hypertension  #6. Irritable bowel syndrome  #7. History of genital herpes.   CC: Patient Care Team: Cassandria Anger, MD as PCP - General Marin Shutter, MD (Orthopedic Surgery) Annia Belt, MD (Hematology and Oncology) Margarette Asal, MD (Obstetrics and Gynecology) Lafayette Dragon, MD (Gastroenterology) Newman Pies, MD as Consulting Physician (Neurosurgery) Gearlean Alf, MD as Consulting Physician (Orthopedic Surgery) Newman Pies, MD as Consulting Physician (Neurosurgery)   Annia Belt, MD 2/16/20161:32 PM

## 2014-08-01 NOTE — Telephone Encounter (Signed)
LVM on cell phone to call and schedule a CC appmt based on note from Dr. Darnell Level visit today. INR=2.1 RTC in one month per Dr. Darnell Level

## 2014-08-01 NOTE — Patient Instructions (Signed)
To lab today The continue monthly PT/INR at [facility] Return visit with Dr Darnell Level 1 year Lab 1 week before visit

## 2014-09-05 ENCOUNTER — Ambulatory Visit (HOSPITAL_BASED_OUTPATIENT_CLINIC_OR_DEPARTMENT_OTHER): Payer: Self-pay | Admitting: Pharmacist

## 2014-09-05 ENCOUNTER — Other Ambulatory Visit (HOSPITAL_BASED_OUTPATIENT_CLINIC_OR_DEPARTMENT_OTHER): Payer: 59

## 2014-09-05 DIAGNOSIS — Z7901 Long term (current) use of anticoagulants: Secondary | ICD-10-CM

## 2014-09-05 DIAGNOSIS — I742 Embolism and thrombosis of arteries of the upper extremities: Secondary | ICD-10-CM | POA: Diagnosis not present

## 2014-09-05 LAB — PROTIME-INR
INR: 2.4 (ref 2.00–3.50)
PROTIME: 28.8 s — AB (ref 10.6–13.4)

## 2014-09-05 LAB — POCT INR: INR: 2.4

## 2014-09-05 NOTE — Progress Notes (Signed)
INR = 2.4     Goal 2-3 INR is within goal range. No complications of anticoagulation noted. No new medications or dietary changes. She has been told she needs a knee replacement, but will put it off as long as possible. She had 2 surgeries in 2015 and does not plan to have the TKR done for quite some time. We will continue Coumadin to 7.5 mg daily except 10 mg on Mondays & Wednesdays. She will return on 10/17/14 for lab at 10:30am and Coumadin clinic at 10:45am.  Theone Murdoch, PharmD

## 2014-10-17 ENCOUNTER — Other Ambulatory Visit (HOSPITAL_BASED_OUTPATIENT_CLINIC_OR_DEPARTMENT_OTHER): Payer: 59

## 2014-10-17 ENCOUNTER — Ambulatory Visit (HOSPITAL_BASED_OUTPATIENT_CLINIC_OR_DEPARTMENT_OTHER): Payer: Self-pay | Admitting: Pharmacist

## 2014-10-17 DIAGNOSIS — I748 Embolism and thrombosis of other arteries: Secondary | ICD-10-CM

## 2014-10-17 DIAGNOSIS — Z7901 Long term (current) use of anticoagulants: Secondary | ICD-10-CM

## 2014-10-17 LAB — PROTIME-INR
INR: 2.1 (ref 2.00–3.50)
Protime: 25.2 Seconds — ABNORMAL HIGH (ref 10.6–13.4)

## 2014-10-17 LAB — POCT INR: INR: 2.1

## 2014-10-17 NOTE — Progress Notes (Signed)
INR = 2.1    Goal 2-3 INR within goal range. No complications of anticoagulation noted. No medication or dietary changes. She will continue Coumadin 7.5 mg daily except 10 mg on Mondays & Wednesdays. She will return on 12/05/14 for lab at 10:30am and Coumadin clinic at 10:45am.  Theone Murdoch, PharmD

## 2014-11-17 ENCOUNTER — Telehealth: Payer: Self-pay | Admitting: *Deleted

## 2014-11-17 MED ORDER — LOSARTAN POTASSIUM 100 MG PO TABS: 100.0000 mg | ORAL_TABLET | Freq: Every day | ORAL | Status: DC

## 2014-11-17 NOTE — Telephone Encounter (Signed)
New Rx sent. See meds.  

## 2014-11-17 NOTE — Telephone Encounter (Signed)
1 po qd Thx

## 2014-11-17 NOTE — Telephone Encounter (Signed)
Pharmacy requesting clarification on Losartan 100 mg. EMR states 1/2 tab daily. Pt states she takes 1 po qd. Please advise.

## 2014-12-05 ENCOUNTER — Other Ambulatory Visit (HOSPITAL_BASED_OUTPATIENT_CLINIC_OR_DEPARTMENT_OTHER): Payer: 59

## 2014-12-05 ENCOUNTER — Ambulatory Visit (HOSPITAL_BASED_OUTPATIENT_CLINIC_OR_DEPARTMENT_OTHER): Payer: Medicare Other

## 2014-12-05 DIAGNOSIS — I748 Embolism and thrombosis of other arteries: Secondary | ICD-10-CM

## 2014-12-05 DIAGNOSIS — I82409 Acute embolism and thrombosis of unspecified deep veins of unspecified lower extremity: Secondary | ICD-10-CM

## 2014-12-05 LAB — PROTIME-INR
INR: 2.5 (ref 2.00–3.50)
Protime: 30 Seconds — ABNORMAL HIGH (ref 10.6–13.4)

## 2014-12-05 LAB — POCT INR: INR: 2.5

## 2014-12-05 NOTE — Progress Notes (Signed)
*  Ms. Cozza is a patient of Dr. Beryle Beams*  Ms. Coble INR is 2.5 today which is within her goal range of 2-3. She has been taking 7.5 mg daily except 10 mg on Mondays & Wednesdays and has been stable on this dose for quite some time, excluding the times around her surgeries. She reports no missed and/or extra doses. No bleeding and/or unusual bruising. No medication or dietary changes. Ms. Farfan has no issues regarding her anticoagulation therapy.  Plan: Continue 7.5 mg daily except 10 mg on Mondays & Wednesdays Return to clinic 02/13/15 (date per patient request due to upcoming travel plans in August): 10:30 for lab, 10:45 for Coumadin Clinic.  Ms. Casso knows to call our office in the meantime if any issues arise.

## 2015-01-10 ENCOUNTER — Ambulatory Visit (INDEPENDENT_AMBULATORY_CARE_PROVIDER_SITE_OTHER): Payer: 59 | Admitting: Internal Medicine

## 2015-01-10 ENCOUNTER — Encounter: Payer: Self-pay | Admitting: Internal Medicine

## 2015-01-10 VITALS — BP 167/100 | HR 91 | Wt 141.0 lb

## 2015-01-10 DIAGNOSIS — I10 Essential (primary) hypertension: Secondary | ICD-10-CM

## 2015-01-10 DIAGNOSIS — M4712 Other spondylosis with myelopathy, cervical region: Secondary | ICD-10-CM

## 2015-01-10 DIAGNOSIS — Z7901 Long term (current) use of anticoagulants: Secondary | ICD-10-CM

## 2015-01-10 DIAGNOSIS — M4722 Other spondylosis with radiculopathy, cervical region: Secondary | ICD-10-CM

## 2015-01-10 DIAGNOSIS — I742 Embolism and thrombosis of arteries of the upper extremities: Secondary | ICD-10-CM

## 2015-01-10 DIAGNOSIS — K219 Gastro-esophageal reflux disease without esophagitis: Secondary | ICD-10-CM

## 2015-01-10 DIAGNOSIS — G4700 Insomnia, unspecified: Secondary | ICD-10-CM

## 2015-01-10 MED ORDER — DIPHENOXYLATE-ATROPINE 2.5-0.025 MG PO TABS: 1.0000 | ORAL_TABLET | Freq: Two times a day (BID) | ORAL | Status: DC | PRN

## 2015-01-10 MED ORDER — TRAMADOL HCL 50 MG PO TABS: 50.0000 mg | ORAL_TABLET | Freq: Two times a day (BID) | ORAL | Status: DC | PRN

## 2015-01-10 MED ORDER — LOSARTAN POTASSIUM 100 MG PO TABS: 100.0000 mg | ORAL_TABLET | Freq: Every day | ORAL | Status: DC

## 2015-01-10 MED ORDER — ZOLPIDEM TARTRATE 10 MG PO TABS: 15.0000 mg | ORAL_TABLET | Freq: Every evening | ORAL | Status: DC | PRN

## 2015-01-10 MED ORDER — VALACYCLOVIR HCL 1 G PO TABS: 1000.0000 mg | ORAL_TABLET | Freq: Two times a day (BID) | ORAL | Status: DC

## 2015-01-10 MED ORDER — WARFARIN SODIUM 5 MG PO TABS: ORAL_TABLET | ORAL | Status: DC

## 2015-01-10 NOTE — Progress Notes (Signed)
Subjective:  Patient ID: Tracy Peters, female    DOB: 08/12/1933  Age: 81 y.o. MRN: [id number]  CC: No chief complaint on file.   HPI Tracy Peters presents for DVT, GERD, dyslipidemia, LBP f/u  Outpatient Prescriptions Prior to Visit  Medication Sig Dispense Refill  . acyclovir ointment (ZOVIRAX) 5 % Apply 1 application topically daily as needed (for outbreak). 15 g 2  . diphenoxylate-atropine (LOMOTIL) 2.5-0.025 MG per tablet Take 1 tablet by mouth 2 (two) times daily as needed for diarrhea or loose stools.     . docusate sodium 100 MG CAPS Take 100 mg by mouth 2 (two) times daily. 60 capsule 0  . EPINEPHrine 0.3 mg/0.3 mL IJ SOAJ injection As dirrected 1 Device 3  . fexofenadine (ALLEGRA) 180 MG tablet Take 180 mg by mouth daily as needed for allergies.     . fish oil-omega-3 fatty acids 1000 MG capsule Take 2 g by mouth daily.     . fluticasone (FLONASE) 50 MCG/ACT nasal spray Place 2 sprays into both nostrils daily.    Marland Kitchen loperamide (IMODIUM A-D) 2 MG tablet Take 2 mg by mouth 4 (four) times daily as needed for diarrhea or loose stools. Will take one by mouth 24min before meals or two as needed.    Marland Kitchen losartan (COZAAR) 100 MG tablet Take 1 tablet (100 mg total) by mouth daily. 90 tablet 3  . Probiotic Product (ALIGN) 4 MG CAPS Take by mouth daily.      . traMADol (ULTRAM) 50 MG tablet Take 1-2 tablets (50-100 mg total) by mouth every 12 (twelve) hours as needed for moderate pain. 90 tablet 5  . triamcinolone ointment (KENALOG) 0.5 % Apply 1 application topically 3 (three) times daily. 30 g 1  . valACYclovir (VALTREX) 1000 MG tablet Take 1 tablet (1,000 mg total) by mouth 2 (two) times daily. 60 tablet 11  . warfarin (COUMADIN) 5 MG tablet Take 7.5 mg on Tues, Thur, Fri, Sat, Sun. Take 10 mg on Mon and Wed. 60 tablet 11  . zolpidem (AMBIEN) 10 MG tablet Take 1 tablet (10 mg total) by mouth at bedtime as needed for sleep. 30 tablet 5   No facility-administered medications prior to  visit.    ROS Review of Systems  Constitutional: Negative for chills, activity change, appetite change, fatigue and unexpected weight change.  HENT: Negative for congestion, mouth sores and sinus pressure.   Eyes: Negative for visual disturbance.  Respiratory: Negative for cough and chest tightness.   Gastrointestinal: Negative for nausea and abdominal pain.  Genitourinary: Negative for frequency, difficulty urinating and vaginal pain.  Musculoskeletal: Positive for back pain and arthralgias. Negative for gait problem.  Skin: Negative for pallor and rash.  Neurological: Negative for dizziness, tremors, weakness, numbness and headaches.  Psychiatric/Behavioral: Positive for sleep disturbance. Negative for confusion. The patient is nervous/anxious.     Objective:  BP 167/100 mmHg  Pulse 91  Wt 141 lb (63.957 kg)  SpO2 97%  BP Readings from Last 3 Encounters:  01/10/15 167/100  08/01/14 155/69  07/12/14 168/82    Wt Readings from Last 3 Encounters:  01/10/15 141 lb (63.957 kg)  08/01/14 135 lb 6.4 oz (61.417 kg)  07/12/14 136 lb (61.689 kg)    Physical Exam  Constitutional: She appears well-developed. No distress.  HENT:  Head: Normocephalic.  Right Ear: External ear normal.  Left Ear: External ear normal.  Nose: Nose normal.  Mouth/Throat: Oropharynx is clear and moist.  Eyes: Conjunctivae  are normal. Pupils are equal, round, and reactive to light. Right eye exhibits no discharge. Left eye exhibits no discharge.  Neck: Normal range of motion. Neck supple. No JVD present. No tracheal deviation present. No thyromegaly present.  Cardiovascular: Normal rate, regular rhythm and normal heart sounds.   Pulmonary/Chest: No stridor. No respiratory distress. She has no wheezes.  Abdominal: Soft. Bowel sounds are normal. She exhibits no distension and no mass. There is no tenderness. There is no rebound and no guarding.  Musculoskeletal: She exhibits no edema or tenderness.    Lymphadenopathy:    She has no cervical adenopathy.  Neurological: She displays normal reflexes. No cranial nerve deficit. She exhibits normal muscle tone. Coordination normal.  Skin: No rash noted. No erythema.  Psychiatric: She has a normal mood and affect. Her behavior is normal. Judgment and thought content normal.    Lab Results  Component Value Date   WBC 9.5 08/01/2014   HGB 13.5 08/01/2014   HCT 40.5 08/01/2014   PLT 293 08/01/2014   GLUCOSE 92 06/01/2014   CHOL 209* 05/31/2012   TRIG 101.0 05/31/2012   HDL 61.50 05/31/2012   LDLDIRECT 129.8 05/31/2012   LDLCALC 115* 11/19/2010   ALT 15 03/21/2014   AST 22 03/21/2014   NA 134* 06/01/2014   K 4.7 06/01/2014   CL 96 06/01/2014   CREATININE 0.84 06/01/2014   BUN 14 06/01/2014   CO2 26 06/01/2014   TSH 4.34 05/03/2014   INR 2.50 12/05/2014    No results found.  Assessment & Plan:   There are no diagnoses linked to this encounter. I am having Tracy Peters maintain her ALIGN, loperamide, fish oil-omega-3 fatty acids, fexofenadine, triamcinolone ointment, diphenoxylate-atropine, fluticasone, DSS, acyclovir ointment, EPINEPHrine, traMADol, warfarin, zolpidem, valACYclovir, and losartan.  No orders of the defined types were placed in this encounter.     Follow-up: No Follow-up on file.  [name], MD

## 2015-01-10 NOTE — Assessment & Plan Note (Signed)
Tramadol prn 

## 2015-01-10 NOTE — Progress Notes (Signed)
Pre visit review using our clinic review tool, if applicable. No additional management support is needed unless otherwise documented below in the visit note. 

## 2015-01-10 NOTE — Assessment & Plan Note (Signed)
Losartan 

## 2015-01-10 NOTE — Assessment & Plan Note (Signed)
On Coumadin 

## 2015-01-10 NOTE — Assessment & Plan Note (Signed)
Chronic  Zolpidem 15 mg/repeat in 4 h prn - pt has to use higher #  X years  Potential benefits of a long term benzodiazepines  use as well as potential risks  and complications were explained to the patient and were aknowledged.

## 2015-01-18 ENCOUNTER — Other Ambulatory Visit: Payer: Self-pay | Admitting: Internal Medicine

## 2015-01-30 DIAGNOSIS — M5416 Radiculopathy, lumbar region: Secondary | ICD-10-CM | POA: Diagnosis not present

## 2015-01-30 DIAGNOSIS — M545 Low back pain: Secondary | ICD-10-CM | POA: Diagnosis not present

## 2015-01-30 DIAGNOSIS — I10 Essential (primary) hypertension: Secondary | ICD-10-CM | POA: Diagnosis not present

## 2015-01-30 DIAGNOSIS — M542 Cervicalgia: Secondary | ICD-10-CM | POA: Diagnosis not present

## 2015-02-01 DIAGNOSIS — M4806 Spinal stenosis, lumbar region: Secondary | ICD-10-CM | POA: Diagnosis not present

## 2015-02-01 DIAGNOSIS — M5416 Radiculopathy, lumbar region: Secondary | ICD-10-CM | POA: Diagnosis not present

## 2015-02-06 DIAGNOSIS — H25812 Combined forms of age-related cataract, left eye: Secondary | ICD-10-CM | POA: Diagnosis not present

## 2015-02-06 DIAGNOSIS — H25012 Cortical age-related cataract, left eye: Secondary | ICD-10-CM | POA: Diagnosis not present

## 2015-02-06 DIAGNOSIS — H2512 Age-related nuclear cataract, left eye: Secondary | ICD-10-CM | POA: Diagnosis not present

## 2015-02-06 DIAGNOSIS — H25811 Combined forms of age-related cataract, right eye: Secondary | ICD-10-CM | POA: Diagnosis not present

## 2015-02-09 DIAGNOSIS — M542 Cervicalgia: Secondary | ICD-10-CM | POA: Diagnosis not present

## 2015-02-09 DIAGNOSIS — I10 Essential (primary) hypertension: Secondary | ICD-10-CM | POA: Diagnosis not present

## 2015-02-13 ENCOUNTER — Other Ambulatory Visit (HOSPITAL_BASED_OUTPATIENT_CLINIC_OR_DEPARTMENT_OTHER): Payer: 59

## 2015-02-13 ENCOUNTER — Ambulatory Visit (HOSPITAL_BASED_OUTPATIENT_CLINIC_OR_DEPARTMENT_OTHER): Payer: 59 | Admitting: Pharmacist

## 2015-02-13 DIAGNOSIS — I748 Embolism and thrombosis of other arteries: Secondary | ICD-10-CM | POA: Diagnosis not present

## 2015-02-13 DIAGNOSIS — I82409 Acute embolism and thrombosis of unspecified deep veins of unspecified lower extremity: Secondary | ICD-10-CM

## 2015-02-13 LAB — PROTIME-INR
INR: 2.2 (ref 2.00–3.50)
Protime: 26.4 Seconds — ABNORMAL HIGH (ref 10.6–13.4)

## 2015-02-13 LAB — POCT INR: INR: 2.2

## 2015-02-13 NOTE — Progress Notes (Signed)
**  Last coumadin clinic visit** **Dr Beryle Beams pt**  INR remains at goal.  No medication changes.  No bleeding or bruising.  Tracy Peters will continue coumadin 10mg  M/W and 7.5mg  other days.  Would check PT/INR in 2 months with Dr. Beryle Beams office.  It has been a pleasure having Tracy Barkett in our coumadin clinic and taking part in her care.

## 2015-02-15 HISTORY — PX: CATARACT EXTRACTION: SUR2

## 2015-02-27 ENCOUNTER — Other Ambulatory Visit: Payer: Self-pay | Admitting: Physician Assistant

## 2015-02-27 DIAGNOSIS — C44721 Squamous cell carcinoma of skin of unspecified lower limb, including hip: Secondary | ICD-10-CM | POA: Diagnosis not present

## 2015-02-27 DIAGNOSIS — L309 Dermatitis, unspecified: Secondary | ICD-10-CM | POA: Diagnosis not present

## 2015-02-27 DIAGNOSIS — D485 Neoplasm of uncertain behavior of skin: Secondary | ICD-10-CM | POA: Diagnosis not present

## 2015-02-27 DIAGNOSIS — C44729 Squamous cell carcinoma of skin of left lower limb, including hip: Secondary | ICD-10-CM | POA: Diagnosis not present

## 2015-03-05 ENCOUNTER — Telehealth: Payer: Self-pay

## 2015-03-05 NOTE — Telephone Encounter (Signed)
OK to fill this prescription with additional refills x5 Thank you!  

## 2015-03-05 NOTE — Telephone Encounter (Signed)
Tracy Peters is requesting rx refill on ambien 10mg ---please advise, thanks

## 2015-03-06 NOTE — Telephone Encounter (Signed)
ambien rx called in to [facility]

## 2015-03-20 DIAGNOSIS — H25811 Combined forms of age-related cataract, right eye: Secondary | ICD-10-CM | POA: Diagnosis not present

## 2015-03-26 ENCOUNTER — Encounter: Payer: Self-pay | Admitting: Pharmacist

## 2015-03-26 NOTE — Progress Notes (Signed)
Pt called Tracy Peters requesting INR lab only next week. 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  The surgeon will be Janann August, MD at [facility] (ph# [phone] or [phone]; fax# [phone]) Mrs. [name] simply needs INR drawn at request of surgeon within 1 week of the surgery.  She was instructed NOT to stop her Coumadin for this procedure.  She prefers to have the INR drawn here for convenience. The result will need to be faxed to Dr. Link Snuffer at the number above as well as faxed to Dr. Azucena Freed office/Dr. Jorene Guest, Pharm.D./Jen Aldean Baker.D. for management on 04/03/15.  I have prepared ahead 2 fax cover sheets for [facility] pharmacist to fax these MD's on 04/03/15. Request sent to scheduling for lab only 10/18 at 10 am.  Pt aware of plan.  Kennith Center, Pharm.D., CPP 03/26/2015@11 :05 AM

## 2015-04-03 ENCOUNTER — Encounter: Payer: Self-pay | Admitting: Pharmacist

## 2015-04-03 ENCOUNTER — Other Ambulatory Visit (HOSPITAL_BASED_OUTPATIENT_CLINIC_OR_DEPARTMENT_OTHER): Payer: 59

## 2015-04-03 DIAGNOSIS — I742 Embolism and thrombosis of arteries of the upper extremities: Secondary | ICD-10-CM | POA: Diagnosis not present

## 2015-04-03 DIAGNOSIS — I82409 Acute embolism and thrombosis of unspecified deep veins of unspecified lower extremity: Secondary | ICD-10-CM

## 2015-04-03 LAB — PROTIME-INR
INR: 3.3 (ref 2.00–3.50)
Protime: 39.6 Seconds — ABNORMAL HIGH (ref 10.6–13.4)

## 2015-04-03 LAB — POCT INR: INR: 3.3

## 2015-04-03 NOTE — Progress Notes (Signed)
A copy of today's INR (= 3.3) was faxed to Dr. Janann August and also to Drs Paulla Dolly and Flossie Dibble at [facility]. Kennith Center, Pharm.D., CPP 04/03/2015@12 :18 PM

## 2015-04-04 ENCOUNTER — Telehealth: Payer: Self-pay | Admitting: *Deleted

## 2015-04-04 ENCOUNTER — Ambulatory Visit (INDEPENDENT_AMBULATORY_CARE_PROVIDER_SITE_OTHER): Payer: 59 | Admitting: Pharmacist

## 2015-04-04 DIAGNOSIS — Z7901 Long term (current) use of anticoagulants: Secondary | ICD-10-CM

## 2015-04-04 DIAGNOSIS — Z86718 Personal history of other venous thrombosis and embolism: Secondary | ICD-10-CM

## 2015-04-04 NOTE — Telephone Encounter (Signed)
Talked to Flossie Dibble - aware of pt/INR and Merleen Nicely is working on this and trying to contact pt.

## 2015-04-04 NOTE — Telephone Encounter (Signed)
-----   Message from Annia Belt, MD sent at 04/03/2015 11:43 AM EDT ----- Call pt: INR running a little high.  I believe her mohs surgery is not until the 24 th  We just need her to be back in the therapeutic range. Can adjust per pharmacist's rec & re-check Fri or she x Can just hold a dose day before procedure

## 2015-04-04 NOTE — Progress Notes (Signed)
Anticoagulation Management PAISLEI DORVAL is a 81 y.o. female who reports to the clinic for monitoring of warfarin treatment.    Indication: History of thrombosis of right radial artery (INR Goal 2-3) Duration: indefinite  Anticoagulation Clinic Visit History: Anticoagulation Episode Summary    Current INR goal 2.0-3.0   Next INR check 04/30/2015   INR from last check 3.30! (04/03/2015)   Weekly max dose    Target end date    INR check location    Preferred lab    Send INR reminders to       Comments       Anticoagulation Care Providers    Provider Role Specialty Phone number   Annia Belt, MD Referring Oncology [phone]     ASSESSMENT Recent Results: Recent results are below, the most recent result is correlated with a dose of 57.5 mg per week: Lab Results  Component Value Date   INR 3.30 04/03/2015   INR 3.30 04/03/2015   INR 2.20 02/13/2015   PROTIME 39.6* 04/03/2015   Pt reports decreased greens in her diet.  Pt states normally eats greens 3-4x/week but has only ate greens 1-2x over the past 2 weeks due to traveling.  Pt denies s/sx of bleeding, medication changes, s/sx of clotting.  Pt states has Mohs surgery on 10/25 for L leg squamous cell at [facility] [phone]).  Pt was NOT instructed to stop her coumadin for this procedure.  Per Dr Beryle Beams can adjust per pharmacist rec or just hold a dose day before procedure.    INR today: Supratherapeutic  Anticoagulation Dosing: INR as of 04/04/2015 and Previous Dosing Information    INR Dt INR Goal Madilyn Fireman Sun Mon Tue Wed Thu Fri Sat   04/03/2015 3.30 2.0-3.0 57.5 mg 7.5 mg 10 mg 7.5 mg 10 mg 7.5 mg 7.5 mg 7.5 mg    Previous description        Continue Coumadin  7.5 mg daily except 10 mg on Mondays & Wednesdays.  Recheck INR in 2 months with Dr. Beryle Beams.     Anticoagulation Dose Instructions as of 04/04/2015      Total Sun Mon Tue Wed Thu Fri Sat   New Dose 50 mg 7.5 mg 10 mg 7.5 mg  10 mg 7.5 mg 0 mg 7.5 mg     (5 mg x 1.5)  (5 mg x 2)  (5 mg x 1.5)  (5 mg x 2)  (5 mg x 1.5)  -  (5 mg x 1.5)               Alt Week 57.5 mg 7.5 mg 10 mg 7.5 mg 10 mg 7.5 mg 7.5 mg 7.5 mg     (5 mg x 1.5)  (5 mg x 2)  (5 mg x 1.5)  (5 mg x 2)  (5 mg x 1.5)  (5 mg x 1.5)  (5 mg x 1.5)                Description        Hold Coumadin on 10/21 then Continue Coumadin  7.5 mg daily except 10 mg on Mondays & Wednesdays.   Will follow-up INR at the [facility] on 04/30/15       PLAN Will hold 7.5 mg dose scheduled for today 10/21 then Continue Coumadin  7.5 mg daily except 10 mg on Mondays & Wednesdays.  Holding coumadin today since pt needs to be in therapeutic range prior to [facility] surgery  on 10/25.  Instructed pt to increase intake of greens by 1-2 servings over the weekend while traveling to her normal intake of 3-4x/week.  Would expect INR to be in therapeutic range by 10/25 after holding dose and increasing greens slightly.  F/U INR at the internal medicine clinic on 04/30/15  Patient Instructions  Called patient via telephone to discuss the following:  Patient instructed to take medications as defined in the Anti-coagulation Track section of this encounter.  Patient instructed to hold today's dose.  Instructed patient to increase intake of greens over the weekend by 1 to 2 servings to return to normal dietary intake of vitamin K.  Patient verbalized understanding of these instructions.         Follow-up Return in 4 weeks (on 04/30/2015) for F/U INR on 04/30/15 at 1130 am .  Bennye Alm, PharmD Pharmacy Resident [phone]  20 minutes spent telephonic with the patient during the encounter. 50% of time spent on education. 50% of time was spent on assessment and plan.

## 2015-04-06 NOTE — Patient Instructions (Signed)
Called patient via telephone to discuss the following:  Patient instructed to take medications as defined in the Anti-coagulation Track section of this encounter.  Patient instructed to hold today's dose.  Instructed patient to increase intake of greens over the weekend by 1 to 2 servings to return to normal dietary intake of vitamin K.  Patient verbalized understanding of these instructions.

## 2015-04-10 DIAGNOSIS — C44722 Squamous cell carcinoma of skin of right lower limb, including hip: Secondary | ICD-10-CM | POA: Diagnosis not present

## 2015-04-19 ENCOUNTER — Ambulatory Visit (INDEPENDENT_AMBULATORY_CARE_PROVIDER_SITE_OTHER): Payer: 59

## 2015-04-19 DIAGNOSIS — Z23 Encounter for immunization: Secondary | ICD-10-CM | POA: Diagnosis not present

## 2015-04-24 DIAGNOSIS — S81802A Unspecified open wound, left lower leg, initial encounter: Secondary | ICD-10-CM | POA: Diagnosis not present

## 2015-04-30 ENCOUNTER — Ambulatory Visit (INDEPENDENT_AMBULATORY_CARE_PROVIDER_SITE_OTHER): Payer: 59 | Admitting: Pharmacist

## 2015-04-30 DIAGNOSIS — Z7901 Long term (current) use of anticoagulants: Secondary | ICD-10-CM

## 2015-04-30 LAB — POCT INR: INR: 2.9

## 2015-04-30 MED ORDER — WARFARIN SODIUM 5 MG PO TABS: ORAL_TABLET | ORAL | Status: DC

## 2015-04-30 NOTE — Addendum Note (Signed)
Addended by: Marcelino Duster on: 04/30/2015 11:51 AM   Modules accepted: Orders

## 2015-04-30 NOTE — Progress Notes (Signed)
Anti-Coagulation Progress Note  ROSSELLA SOUTH is a 81 y.o. female who is currently on an anti-coagulation regimen.    RECENT RESULTS: Recent results are below, the most recent result is correlated with a dose of 57.5 mg. per week: Lab Results  Component Value Date   INR 2.90 04/30/2015   INR 3.30 04/03/2015   INR 3.30 04/03/2015   PROTIME 39.6* 04/03/2015    ANTI-COAG DOSE: Anticoagulation Dose Instructions as of 04/30/2015      Sun Mon Tue Wed Thu Fri Sat   New Dose 7.5 mg 10 mg 7.5 mg 10 mg 7.5 mg 7.5 mg 7.5 mg       ANTICOAG SUMMARY: Anticoagulation Episode Summary    Current INR goal 2.0-3.0   Next INR check 05/28/2015   INR from last check 2.90 (04/30/2015)   Weekly max dose    Target end date    INR check location    Preferred lab    Send INR reminders to       Comments       Anticoagulation Care Providers    Provider Role Specialty Phone number   Annia Belt, MD Referring Oncology [phone]      ANTICOAG TODAY: Anticoagulation Summary as of 04/30/2015    INR goal 2.0-3.0   Selected INR 2.90 (04/30/2015)   Next INR check 05/28/2015   Target end date     Anticoagulation Episode Summary    INR check location    Preferred lab    Send INR reminders to    Comments     Anticoagulation Care Providers    Provider Role Specialty Phone number   Annia Belt, MD Referring Oncology [phone]      PATIENT INSTRUCTIONS: Patient Instructions  Patient instructed to take medications as defined in the Anti-coagulation Track section of this encounter.  Patient instructed to take today's dose.  Patient verbalized understanding of these instructions.       FOLLOW-UP Return in 4 weeks (on 05/28/2015) for Follow up INR at 1130h.  Jorene Guest, III Pharm.D., CACP

## 2015-04-30 NOTE — Progress Notes (Signed)
Reviewed & agree. thx

## 2015-04-30 NOTE — Patient Instructions (Signed)
Patient instructed to take medications as defined in the Anti-coagulation Track section of this encounter.  Patient instructed to take today's dose.  Patient verbalized understanding of these instructions.    

## 2015-05-01 DIAGNOSIS — S81802D Unspecified open wound, left lower leg, subsequent encounter: Secondary | ICD-10-CM | POA: Diagnosis not present

## 2015-05-01 DIAGNOSIS — L570 Actinic keratosis: Secondary | ICD-10-CM | POA: Diagnosis not present

## 2015-05-08 DIAGNOSIS — S81802D Unspecified open wound, left lower leg, subsequent encounter: Secondary | ICD-10-CM | POA: Diagnosis not present

## 2015-05-28 ENCOUNTER — Ambulatory Visit (INDEPENDENT_AMBULATORY_CARE_PROVIDER_SITE_OTHER): Payer: 59 | Admitting: Pharmacist

## 2015-05-28 DIAGNOSIS — Z7901 Long term (current) use of anticoagulants: Secondary | ICD-10-CM

## 2015-05-28 LAB — POCT INR: INR: 2.9

## 2015-05-28 NOTE — Patient Instructions (Signed)
Patient instructed to take medications as defined in the Anti-coagulation Track section of this encounter.  Patient instructed to take today's dose.  Patient verbalized understanding of these instructions.    

## 2015-05-28 NOTE — Progress Notes (Signed)
Reviewed thanks! 

## 2015-05-28 NOTE — Progress Notes (Signed)
Anti-Coagulation Progress Note  Tracy Peters is a 81 y.o. female who is currently on an anti-coagulation regimen.    RECENT RESULTS: Recent results are below, the most recent result is correlated with a dose of 57.5 mg. per week: Lab Results  Component Value Date   INR 2.90 05/28/2015   INR 2.90 04/30/2015   INR 3.30 04/03/2015   PROTIME 39.6* 04/03/2015    ANTI-COAG DOSE: Anticoagulation Dose Instructions as of 05/28/2015      Dorene Grebe Tue Wed Thu Fri Sat   New Dose 7.5 mg 7.5 mg 7.5 mg 10 mg 7.5 mg 7.5 mg 7.5 mg       ANTICOAG SUMMARY: Anticoagulation Episode Summary    Current INR goal 2.0-3.0   Next INR check 07/09/2015   INR from last check 2.90 (05/28/2015)   Weekly max dose    Target end date    INR check location    Preferred lab    Send INR reminders to       Comments       Anticoagulation Care Providers    Provider Role Specialty Phone number   Annia Belt, MD Referring Oncology [phone]      ANTICOAG TODAY: Anticoagulation Summary as of 05/28/2015    INR goal 2.0-3.0   Selected INR 2.90 (05/28/2015)   Next INR check 07/09/2015   Target end date     Anticoagulation Episode Summary    INR check location    Preferred lab    Send INR reminders to    Comments     Anticoagulation Care Providers    Provider Role Specialty Phone number   Annia Belt, MD Referring Oncology [phone]      PATIENT INSTRUCTIONS: Patient Instructions  Patient instructed to take medications as defined in the Anti-coagulation Track section of this encounter.  Patient instructed to take today's dose.  Patient verbalized understanding of these instructions.       FOLLOW-UP Return in about 6 weeks (around 07/09/2015) for Follow up INR at 1130h.  Jorene Guest, III Pharm.D., CACP

## 2015-06-05 DIAGNOSIS — S81802D Unspecified open wound, left lower leg, subsequent encounter: Secondary | ICD-10-CM | POA: Diagnosis not present

## 2015-06-12 DIAGNOSIS — S81802D Unspecified open wound, left lower leg, subsequent encounter: Secondary | ICD-10-CM | POA: Diagnosis not present

## 2015-06-15 ENCOUNTER — Other Ambulatory Visit: Payer: 59

## 2015-06-15 ENCOUNTER — Encounter: Payer: Self-pay | Admitting: Internal Medicine

## 2015-06-15 ENCOUNTER — Ambulatory Visit (INDEPENDENT_AMBULATORY_CARE_PROVIDER_SITE_OTHER): Payer: 59 | Admitting: Internal Medicine

## 2015-06-15 VITALS — BP 148/90 | HR 84 | Temp 98.0°F | Ht 64.0 in | Wt 137.0 lb

## 2015-06-15 DIAGNOSIS — I742 Embolism and thrombosis of arteries of the upper extremities: Secondary | ICD-10-CM

## 2015-06-15 DIAGNOSIS — R300 Dysuria: Secondary | ICD-10-CM | POA: Diagnosis not present

## 2015-06-15 DIAGNOSIS — Z7901 Long term (current) use of anticoagulants: Secondary | ICD-10-CM

## 2015-06-15 DIAGNOSIS — I10 Essential (primary) hypertension: Secondary | ICD-10-CM

## 2015-06-15 LAB — POCT URINALYSIS DIPSTICK
BILIRUBIN UA: NEGATIVE
GLUCOSE UA: NEGATIVE
Ketones, UA: NEGATIVE
NITRITE UA: POSITIVE
SPEC GRAV UA: 1.02
Urobilinogen, UA: 0.2
pH, UA: 6

## 2015-06-15 MED ORDER — NITROFURANTOIN MONOHYD MACRO 100 MG PO CAPS: 100.0000 mg | ORAL_CAPSULE | Freq: Two times a day (BID) | ORAL | Status: DC

## 2015-06-15 NOTE — Patient Instructions (Signed)
Please take all new medication as prescribed - the antibiotic  The specimen will also be sent for culture  Please continue all other medications as before, and refills have been done if requested.  Please have the pharmacy call with any other refills you may need.  Please keep your appointments with your specialists as you may have planned

## 2015-06-15 NOTE — Progress Notes (Signed)
Subjective:    Patient ID: Tracy Peters, female    DOB: 20-Jun-1933, 81 y.o.   MRN: [id number]  HPI  Here with 3 days onset dysuria with mild freq, urgency, and hematuria, lower abd/pelvic pain,  but Denies urinary symptoms such as flank pain or n/v, fever, chills.  Pt denies chest pain, increased sob or doe, wheezing, orthopnea, PND, increased LE swelling, palpitations, dizziness or syncope.  Pt denies new neurological symptoms such as new headache, or facial or extremity weakness or numbness   Pt denies polydipsia, polyuria, Past Medical History  Diagnosis Date  . LBP (low back pain)   . Long term (current) use of anticoagulants     Dr. Beryle Beams  . History of DVT (deep vein thrombosis) 1998    right arm  . GERD (gastroesophageal reflux disease)     mild  . Insomnia     takes Ambien nightly as needed  . Herpes     takes Valtrex daily  . Allergic rhinitis     uses FLonase nightly  . Hyperlipidemia     Medical MD doesn't think its absolutely necessary  . Diarrhea     takes Immodium daily as needed or Lomotil   . Adenomatous colon polyp   . IBS (irritable bowel syndrome)     takes Electronics engineer daily  . UTI (urinary tract infection)   . 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[place]) 09/16/2011    Right digital artery 1998 idiopathic   . Anemia   . Blood transfusion   . Osteoporosis   . Congenital absence of one kidney   . Chronic anticoagulation 03/21/2013  . Hypertension     takes Losartan daily  . Raynaud's disease   . Bronchitis     hx of-7-53yrs ago  . Carpal tunnel syndrome     numbness and tingling   . Arthritis     joint pain  . History of colon polyps   . Diverticulosis   . Cataracts, bilateral   . Nocturia   . Urinary leakage   . History of staph infection    Past Surgical History  Procedure Laterality Date  . Cholecystectomy    . Abdominal hysterectomy    . Appendectomy    . Breast surgery      cystectomy-benign  . Lumbar laminectomy      X 2  . Knee surgery        left x 3  . Shoulder surgery      Right  . Back surgery      2  . Anterior cervical decomp/discectomy fusion N/A 01/18/2014    Procedure: ANTERIOR CERVICAL DECOMPRESSION/DISCECTOMY FUSION 3 LEVELS  Cervical  four/five, five/six, six/seven anterior cervical decompression with fusion interbody prosthesis with plating and bonegraft;  Surgeon: Newman Pies, MD;  Location: [facility];  Service: Neurosurgery;  Laterality: N/A;  t  . Mortons neuromas removed    . Colonoscopy    . I&d of abdomen  1988    couple of wks after gallbladder removed  . Carpal tunnel release Right 06/12/2014    Procedure: CARPAL TUNNEL RELEASE;  Surgeon: Newman Pies, MD;  Location: [facility];  Service: Neurosurgery;  Laterality: Right;  Right Carpal Tunnel Release    reports that she has never smoked. She has never used smokeless tobacco. She reports that she does not drink alcohol or use illicit drugs. family history includes Breast cancer in her sister; Heart disease in her brother; Kidney disease (age of onset: 35)  in her sister; Osteoarthritis in her other; Parkinsonism in her mother; Ulcerative colitis in her sister. There is no history of Colon cancer. Allergies  Allergen Reactions  . Bee Venom Anaphylaxis    Yellow jackets, wasps as well  . Ivp Dye [Iodinated Diagnostic Agents] Anaphylaxis  . Shellfish Allergy Anaphylaxis  . Amlodipine Besylate     REACTION: swelling  . Aspirin     REACTION: unspecified  . Atorvastatin     REACTION: diarrhea  . Cholestyramine     REACTION: mouth irritation  . Codeine Phosphate Nausea And Vomiting  . Demerol [Meperidine]     Severe Hallucination!!!!  . Diphenhydramine Hcl Other (See Comments)    hyperactive  . Doxycycline     REACTION: nausea  . Gentamicin Sulfate     REACTION: unspecified  . Meclizine Hcl     REACTION: more dizziness on it  . Meperidine Hcl   . Metronidazole     REACTION: bad taste  . Moxifloxacin     REACTION: insomnia Able to use  Cipro  . Sulfamethoxazole     Kidney problems  . Penicillins Swelling and Rash   Current Outpatient Prescriptions on File Prior to Visit  Medication Sig Dispense Refill  . acyclovir ointment (ZOVIRAX) 5 % Apply 1 application topically daily as needed (for outbreak). 15 g 2  . diphenoxylate-atropine (LOMOTIL) 2.5-0.025 MG per tablet Take 1 tablet by mouth 2 (two) times daily as needed for diarrhea or loose stools. 30 tablet 2  . docusate sodium 100 MG CAPS Take 100 mg by mouth 2 (two) times daily. 60 capsule 0  . EPINEPHrine 0.3 mg/0.3 mL IJ SOAJ injection As dirrected 1 Device 3  . fexofenadine (ALLEGRA) 180 MG tablet Take 180 mg by mouth daily as needed for allergies.     . fish oil-omega-3 fatty acids 1000 MG capsule Take 2 g by mouth daily.     . fluticasone (FLONASE) 50 MCG/ACT nasal spray Place 2 sprays into both nostrils daily.    Marland Kitchen loperamide (IMODIUM A-D) 2 MG tablet Take 2 mg by mouth 4 (four) times daily as needed for diarrhea or loose stools. Will take one by mouth 33min before meals or two as needed.    Marland Kitchen losartan (COZAAR) 100 MG tablet Take 1 tablet (100 mg total) by mouth daily. 90 tablet 3  . Probiotic Product (ALIGN) 4 MG CAPS Take by mouth daily.      . traMADol (ULTRAM) 50 MG tablet Take 1-2 tablets (50-100 mg total) by mouth every 12 (twelve) hours as needed for moderate pain. 90 tablet 5  . triamcinolone ointment (KENALOG) 0.5 % APPLY TO AFFECTED AREA THREE TIMES DAILY. 30 g 0  . valACYclovir (VALTREX) 1000 MG tablet Take 1 tablet (1,000 mg total) by mouth 2 (two) times daily. 60 tablet 11  . warfarin (COUMADIN) 5 MG tablet Take 7.5 mg on Tues, Thur, Fri, Sat, Sun. Take 10 mg on Mon and Wed. 60 tablet 11  . zolpidem (AMBIEN) 10 MG tablet Take 1.5 tablets (15 mg total) by mouth at bedtime as needed for sleep. Ok to repeat prn in 4 hrs 135 tablet 5   No current facility-administered medications on file prior to visit.    Review of Systems  Constitutional: Negative for  unusual diaphoresis or night sweats HENT: Negative for ringing in ear or discharge Eyes: Negative for double vision or worsening visual disturbance.  Respiratory: Negative for choking and stridor.   Gastrointestinal: Negative for vomiting or other signifcant  bowel change Genitourinary: Negative for hematuria or change in urine volume.  Musculoskeletal: Negative for other MSK pain or swelling Skin: Negative for color change and worsening wound.  Neurological: Negative for tremors and numbness other than noted  Psychiatric/Behavioral: Negative for decreased concentration or agitation other than above       Objective:   Physical Exam BP 148/90 mmHg  Pulse 84  Temp(Src) 98 F (36.7 C) (Oral)  Ht 5\' 4"  (1.626 m)  Wt 137 lb (62.143 kg)  BMI 23.50 kg/m2  SpO2 97% VS noted, mild ill Constitutional: Pt appears in no significant distress HENT: Head: NCAT.  Right Ear: External ear normal.  Left Ear: External ear normal.  Eyes: . Pupils are equal, round, and reactive to light. Conjunctivae and EOM are normal Neck: Normal range of motion. Neck supple.  Cardiovascular: Normal rate and regular rhythm.   Pulmonary/Chest: Effort normal and breath sounds without rales or wheezing.  Abd:  Soft, ND, + BS., and tender low mid abd tender, without guarding or rebound Neurological: Pt is alert. Not confused , motor grossly intact Skin: Skin is warm. No rash, no LE edema Psychiatric: Pt behavior is normal. No agitation.    POCT urinalysis dipstick  Status: Finalresult Visible to patient:  Not Released Dx:  Dysuria              Ref Range 11:10 AM  4yr ago  4yr ago  72yr ago     Color, UA  brown       Clarity, UA  cloudy       Glucose, UA  neg       Bilirubin, UA  neg       Ketones, UA  neg       Spec Grav, UA  1.020       Blood, UA  3+       pH, UA  6.0       Protein, UA  1+       Urobilinogen, UA  0.2 0.2 0.2 0.2    Nitrite, UA  positive       Leukocytes,  UA Negative  large (3+) (A)            Last ct abd/pelvis oct 2012:  IMPRESSION:  1. No specific signs of intra-abdominal carcinoid tumor identified. 2. There are progressive sigmoid colon diverticular changes without evidence of acute inflammation. 3. Stable atrophy of the left kidney and compensatory hypertrophy of the right kidney.  Original Report Authenticated By: Vivia Ewing, M.D    Assessment & Plan:

## 2015-06-15 NOTE — Progress Notes (Signed)
Pre visit review using our clinic review tool, if applicable. No additional management support is needed unless otherwise documented below in the visit note. 

## 2015-06-15 NOTE — Assessment & Plan Note (Signed)
Mild to mod, for urine cx, has mult antibx allergy but rare UTI - for macrobid  to f/u any worsening symptoms or concerns

## 2015-06-15 NOTE — Assessment & Plan Note (Signed)
Mild elev today likely situational, o/w stable overall by history and exam, recent data reviewed with pt, and pt to continue medical treatment as before,  to f/u any worsening symptoms or concerns BP Readings from Last 3 Encounters:  06/15/15 148/90  01/10/15 167/100  08/01/14 155/69

## 2015-06-15 NOTE — Assessment & Plan Note (Signed)
With hematuria likely assoc with coumadin, to start antbx, last INR 2.9 earlier this month, consider hold coumadin if hematuria not improved in 1 day, consider cbc, INR but declines today

## 2015-06-18 LAB — URINE CULTURE

## 2015-06-19 DIAGNOSIS — S81802D Unspecified open wound, left lower leg, subsequent encounter: Secondary | ICD-10-CM | POA: Diagnosis not present

## 2015-07-09 ENCOUNTER — Ambulatory Visit (INDEPENDENT_AMBULATORY_CARE_PROVIDER_SITE_OTHER): Payer: 59 | Admitting: Pharmacist

## 2015-07-09 DIAGNOSIS — Z86718 Personal history of other venous thrombosis and embolism: Secondary | ICD-10-CM

## 2015-07-09 DIAGNOSIS — Z7901 Long term (current) use of anticoagulants: Secondary | ICD-10-CM

## 2015-07-10 LAB — POCT INR: INR: 3.4

## 2015-07-10 NOTE — Progress Notes (Signed)
Reviewed Thanks DrG 

## 2015-07-10 NOTE — Progress Notes (Signed)
Anti-Coagulation Progress Note  Tracy Peters is a 82 y.o. female who is currently on an anti-coagulation regimen.    RECENT RESULTS: Recent results are below, the most recent result is correlated with a dose of 55 mg. per week: Lab Results  Component Value Date   INR 3.40 07/10/2015   INR 2.90 05/28/2015   INR 2.90 04/30/2015   PROTIME 39.6* 04/03/2015    ANTI-COAG DOSE: Anticoagulation Dose Instructions as of 07/09/2015      Dorene Grebe Tue Wed Thu Fri Sat   New Dose 5 mg 7.5 mg 7.5 mg 7.5 mg 7.5 mg 7.5 mg 7.5 mg       ANTICOAG SUMMARY: Anticoagulation Episode Summary    Current INR goal 2.0-3.0   Next INR check 08/20/2015   INR from last check 2.90 (05/28/2015)   Most recent INR 3.40! (07/10/2015)   Weekly max dose    Target end date    INR check location    Preferred lab    Send INR reminders to       Comments       Anticoagulation Care Providers    Provider Role Specialty Phone number   Annia Belt, MD Referring Oncology [phone]      ANTICOAG TODAY: Anticoagulation Summary as of 07/09/2015    INR goal 2.0-3.0   Selected INR 2.90 (05/28/2015)   Next INR check 08/20/2015   Target end date     Anticoagulation Episode Summary    INR check location    Preferred lab    Send INR reminders to    Comments     Anticoagulation Care Providers    Provider Role Specialty Phone number   Annia Belt, MD Referring Oncology [phone]      PATIENT INSTRUCTIONS: Patient Instructions  Patient instructed to take medications as defined in the Anti-coagulation Track section of this encounter.  Patient instructed to take today's dose.  Patient verbalized understanding of these instructions.       FOLLOW-UP Return in 6 weeks (on 08/20/2015) for Follow up INR at 1130h.  Jorene Guest, III Pharm.D., CACP

## 2015-07-10 NOTE — Patient Instructions (Signed)
Patient instructed to take medications as defined in the Anti-coagulation Track section of this encounter.  Patient instructed to take today's dose.  Patient verbalized understanding of these instructions.    

## 2015-07-13 ENCOUNTER — Encounter: Payer: Self-pay | Admitting: Internal Medicine

## 2015-07-13 ENCOUNTER — Ambulatory Visit (INDEPENDENT_AMBULATORY_CARE_PROVIDER_SITE_OTHER): Payer: 59 | Admitting: Internal Medicine

## 2015-07-13 VITALS — BP 160/90 | HR 85 | Wt 136.0 lb

## 2015-07-13 DIAGNOSIS — C4922 Malignant neoplasm of connective and soft tissue of left lower limb, including hip: Secondary | ICD-10-CM | POA: Diagnosis not present

## 2015-07-13 DIAGNOSIS — C4920 Malignant neoplasm of connective and soft tissue of unspecified lower limb, including hip: Secondary | ICD-10-CM | POA: Insufficient documentation

## 2015-07-13 DIAGNOSIS — M5440 Lumbago with sciatica, unspecified side: Secondary | ICD-10-CM

## 2015-07-13 DIAGNOSIS — Z23 Encounter for immunization: Secondary | ICD-10-CM | POA: Diagnosis not present

## 2015-07-13 DIAGNOSIS — Z0000 Encounter for general adult medical examination without abnormal findings: Secondary | ICD-10-CM

## 2015-07-13 DIAGNOSIS — E785 Hyperlipidemia, unspecified: Secondary | ICD-10-CM | POA: Diagnosis not present

## 2015-07-13 DIAGNOSIS — I10 Essential (primary) hypertension: Secondary | ICD-10-CM

## 2015-07-13 DIAGNOSIS — G4700 Insomnia, unspecified: Secondary | ICD-10-CM

## 2015-07-13 DIAGNOSIS — G8929 Other chronic pain: Secondary | ICD-10-CM

## 2015-07-13 MED ORDER — FLUTICASONE PROPIONATE 50 MCG/ACT NA SUSP: 2.0000 | Freq: Every day | NASAL | Status: DC

## 2015-07-13 MED ORDER — WARFARIN SODIUM 5 MG PO TABS: ORAL_TABLET | ORAL | Status: DC

## 2015-07-13 MED ORDER — EPINEPHRINE 0.3 MG/0.3ML IJ SOAJ: INTRAMUSCULAR | Status: DC

## 2015-07-13 MED ORDER — ZOLPIDEM TARTRATE 10 MG PO TABS: 15.0000 mg | ORAL_TABLET | Freq: Every evening | ORAL | Status: DC | PRN

## 2015-07-13 MED ORDER — TRAMADOL HCL 50 MG PO TABS: 50.0000 mg | ORAL_TABLET | Freq: Two times a day (BID) | ORAL | Status: DC | PRN

## 2015-07-13 MED ORDER — TRIAMCINOLONE ACETONIDE 0.5 % EX OINT: TOPICAL_OINTMENT | Freq: Four times a day (QID) | CUTANEOUS | Status: DC

## 2015-07-13 MED ORDER — VALACYCLOVIR HCL 1 G PO TABS: 1000.0000 mg | ORAL_TABLET | Freq: Two times a day (BID) | ORAL | Status: DC

## 2015-07-13 MED ORDER — DIPHENOXYLATE-ATROPINE 2.5-0.025 MG PO TABS: 1.0000 | ORAL_TABLET | Freq: Two times a day (BID) | ORAL | Status: DC | PRN

## 2015-07-13 MED ORDER — LOSARTAN POTASSIUM 100 MG PO TABS: 100.0000 mg | ORAL_TABLET | Freq: Every day | ORAL | Status: DC

## 2015-07-13 NOTE — Assessment & Plan Note (Signed)
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

## 2015-07-13 NOTE — Assessment & Plan Note (Signed)
Doing well 

## 2015-07-13 NOTE — Progress Notes (Signed)
Subjective:  Patient ID: Tracy Peters, female    DOB: 10/22/1933  Age: 82 y.o. MRN: [id number]  CC: No chief complaint on file.   HPI MAHAM QUINTIN presents for anxiety, allergy, herpes infection, HTN f/u. Pt had sarcoma surgery on LLE  Outpatient Prescriptions Prior to Visit  Medication Sig Dispense Refill  . acyclovir ointment (ZOVIRAX) 5 % Apply 1 application topically daily as needed (for outbreak). 15 g 2  . docusate sodium 100 MG CAPS Take 100 mg by mouth 2 (two) times daily. 60 capsule 0  . fexofenadine (ALLEGRA) 180 MG tablet Take 180 mg by mouth daily as needed for allergies.     . fish oil-omega-3 fatty acids 1000 MG capsule Take 2 g by mouth daily.     Marland Kitchen loperamide (IMODIUM A-D) 2 MG tablet Take 2 mg by mouth 4 (four) times daily as needed for diarrhea or loose stools. Will take one by mouth 5mn before meals or two as needed.    . nitrofurantoin, macrocrystal-monohydrate, (MACROBID) 100 MG capsule Take 1 capsule (100 mg total) by mouth 2 (two) times daily. 20 capsule 0  . Probiotic Product (ALIGN) 4 MG CAPS Take by mouth daily.      . diphenoxylate-atropine (LOMOTIL) 2.5-0.025 MG per tablet Take 1 tablet by mouth 2 (two) times daily as needed for diarrhea or loose stools. 30 tablet 2  . EPINEPHrine 0.3 mg/0.3 mL IJ SOAJ injection As dirrected 1 Device 3  . fluticasone (FLONASE) 50 MCG/ACT nasal spray Place 2 sprays into both nostrils daily.    .Marland Kitchenlosartan (COZAAR) 100 MG tablet Take 1 tablet (100 mg total) by mouth daily. 90 tablet 3  . traMADol (ULTRAM) 50 MG tablet Take 1-2 tablets (50-100 mg total) by mouth every 12 (twelve) hours as needed for moderate pain. 90 tablet 5  . triamcinolone ointment (KENALOG) 0.5 % APPLY TO AFFECTED AREA THREE TIMES DAILY. 30 g 0  . valACYclovir (VALTREX) 1000 MG tablet Take 1 tablet (1,000 mg total) by mouth 2 (two) times daily. 60 tablet 11  . warfarin (COUMADIN) 5 MG tablet Take 7.5 mg on Tues, Thur, Fri, Sat, Sun. Take 10 mg on Mon and  Wed. 60 tablet 11  . zolpidem (AMBIEN) 10 MG tablet Take 1.5 tablets (15 mg total) by mouth at bedtime as needed for sleep. Ok to repeat prn in 4 hrs 135 tablet 5   No facility-administered medications prior to visit.    ROS Review of Systems  Constitutional: Negative for chills, activity change, appetite change, fatigue and unexpected weight change.  HENT: Negative for congestion, mouth sores and sinus pressure.   Eyes: Negative for visual disturbance.  Respiratory: Negative for cough and chest tightness.   Gastrointestinal: Negative for nausea and abdominal pain.  Genitourinary: Negative for frequency, difficulty urinating and vaginal pain.  Musculoskeletal: Negative for back pain and gait problem.  Skin: Negative for pallor and rash.  Neurological: Negative for dizziness, tremors, weakness, numbness and headaches.  Psychiatric/Behavioral: Negative for confusion and sleep disturbance. The patient is not nervous/anxious.     Objective:  BP 160/90 mmHg  Pulse 85  Wt 136 lb (61.689 kg)  SpO2 98%  BP Readings from Last 3 Encounters:  07/13/15 160/90  06/15/15 148/90  01/10/15 167/100    Wt Readings from Last 3 Encounters:  07/13/15 136 lb (61.689 kg)  06/15/15 137 lb (62.143 kg)  01/10/15 141 lb (63.957 kg)    Physical Exam  Constitutional: She appears well-developed. No distress.  HENT:  Head: Normocephalic.  Right Ear: External ear normal.  Left Ear: External ear normal.  Nose: Nose normal.  Mouth/Throat: Oropharynx is clear and moist.  Eyes: Conjunctivae are normal. Pupils are equal, round, and reactive to light. Right eye exhibits no discharge. Left eye exhibits no discharge.  Neck: Normal range of motion. Neck supple. No JVD present. No tracheal deviation present. No thyromegaly present.  Cardiovascular: Normal rate, regular rhythm and normal heart sounds.   Pulmonary/Chest: No stridor. No respiratory distress. She has no wheezes.  Abdominal: Soft. Bowel sounds  are normal. She exhibits no distension and no mass. There is no tenderness. There is no rebound and no guarding.  Musculoskeletal: She exhibits no edema or tenderness.  Lymphadenopathy:    She has no cervical adenopathy.  Neurological: She displays normal reflexes. No cranial nerve deficit. She exhibits normal muscle tone. Coordination normal.  Skin: No rash noted. No erythema.  Psychiatric: She has a normal mood and affect. Her behavior is normal. Judgment and thought content normal.    Lab Results  Component Value Date   WBC 9.5 08/01/2014   HGB 13.5 08/01/2014   HCT 40.5 08/01/2014   PLT 293 08/01/2014   GLUCOSE 92 06/01/2014   CHOL 209* 05/31/2012   TRIG 101.0 05/31/2012   HDL 61.50 05/31/2012   LDLDIRECT 129.8 05/31/2012   LDLCALC 115* 11/19/2010   ALT 15 03/21/2014   AST 22 03/21/2014   NA 134* 06/01/2014   K 4.7 06/01/2014   CL 96 06/01/2014   CREATININE 0.84 06/01/2014   BUN 14 06/01/2014   CO2 26 06/01/2014   TSH 4.34 05/03/2014   INR 3.40 07/10/2015    No results found.  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 -     EPINEPHrine 0.3 mg/0.3 mL IJ SOAJ injection; As dirrected -     fluticasone (FLONASE) 50 MCG/ACT nasal spray; Place 2 sprays into both nostrils daily. -     losartan (COZAAR) 100 MG tablet; Take 1 tablet (100 mg total) by mouth daily. -     traMADol (ULTRAM) 50 MG tablet; Take 1-2 tablets (50-100 mg total) by mouth every 12 (twelve) hours as needed for moderate pain. -     triamcinolone ointment (KENALOG) 0.5 %; Apply topically 4 (four) times daily. -     valACYclovir (VALTREX) 1000  MG tablet; Take 1 tablet (1,000 mg total) by mouth 2 (two) times daily. -     warfarin (COUMADIN) 5 MG tablet; Take 7.5 mg on Tues, Thur, Fri, Sat, Sun. Take 10 mg on Mon and Wed. -     zolpidem (AMBIEN) 10 MG tablet; Take 1.5 tablets (15 mg total) by mouth at bedtime as needed for sleep. Ok to repeat prn in 4 hrs  I have changed Ms. Larocque's diphenoxylate-atropine and triamcinolone ointment. I am also having her maintain her ALIGN, loperamide, fish oil-omega-3 fatty acids, fexofenadine, DSS, acyclovir ointment, nitrofurantoin (macrocrystal-monohydrate), EPINEPHrine, fluticasone, losartan, traMADol, valACYclovir, warfarin, and zolpidem.  Meds ordered this encounter  Medications  . diphenoxylate-atropine (LOMOTIL) 2.5-0.025 MG tablet    Sig: Take 1 tablet by mouth 2 (two) times daily as  needed for diarrhea or loose stools.    Dispense:  30 tablet    Refill:  2  . EPINEPHrine 0.3 mg/0.3 mL IJ SOAJ injection    Sig: As dirrected    Dispense:  1 Device    Refill:  3  . fluticasone (FLONASE) 50 MCG/ACT nasal spray    Sig: Place 2 sprays into both nostrils daily.    Dispense:  16 g    Refill:  3  . losartan (COZAAR) 100 MG tablet    Sig: Take 1 tablet (100 mg total) by mouth daily.    Dispense:  90 tablet    Refill:  3  . traMADol (ULTRAM) 50 MG tablet    Sig: Take 1-2 tablets (50-100 mg total) by mouth every 12 (twelve) hours as needed for moderate pain.    Dispense:  90 tablet    Refill:  5  . triamcinolone ointment (KENALOG) 0.5 %    Sig: Apply topically 4 (four) times daily.    Dispense:  30 g    Refill:  3  . valACYclovir (VALTREX) 1000 MG tablet    Sig: Take 1 tablet (1,000 mg total) by mouth 2 (two) times daily.    Dispense:  60 tablet    Refill:  11  . warfarin (COUMADIN) 5 MG tablet    Sig: Take 7.5 mg on Tues, Thur, Fri, Sat, Sun. Take 10 mg on Mon and Wed.    Dispense:  60 tablet    Refill:  11  . zolpidem (AMBIEN) 10 MG tablet    Sig: Take 1.5 tablets (15 mg total) by  mouth at bedtime as needed for sleep. Ok to repeat prn in 4 hrs    Dispense:  135 tablet    Refill:  5     Follow-up: Return in about 6 months (around 01/10/2016) for Wellness Exam.  [name], MD

## 2015-07-13 NOTE — Assessment & Plan Note (Signed)
Zolpidem

## 2015-07-13 NOTE — Progress Notes (Signed)
Pre visit review using our clinic review tool, if applicable. No additional management support is needed unless otherwise documented below in the visit note. 

## 2015-07-13 NOTE — Addendum Note (Signed)
Addended by: Cresenciano Lick on: 07/13/2015 12:07 PM   Modules accepted: Orders

## 2015-07-13 NOTE — Assessment & Plan Note (Signed)
Fish oil

## 2015-07-24 ENCOUNTER — Other Ambulatory Visit: Payer: Medicare Other

## 2015-07-31 ENCOUNTER — Ambulatory Visit (INDEPENDENT_AMBULATORY_CARE_PROVIDER_SITE_OTHER): Payer: 59 | Admitting: Oncology

## 2015-07-31 ENCOUNTER — Encounter: Payer: Self-pay | Admitting: Oncology

## 2015-07-31 VITALS — BP 147/73 | HR 86 | Temp 98.1°F | Ht 65.0 in | Wt 135.0 lb

## 2015-07-31 DIAGNOSIS — Z981 Arthrodesis status: Secondary | ICD-10-CM

## 2015-07-31 DIAGNOSIS — C4492 Squamous cell carcinoma of skin, unspecified: Secondary | ICD-10-CM

## 2015-07-31 DIAGNOSIS — Z7901 Long term (current) use of anticoagulants: Secondary | ICD-10-CM | POA: Diagnosis not present

## 2015-07-31 DIAGNOSIS — D539 Nutritional anemia, unspecified: Secondary | ICD-10-CM | POA: Diagnosis not present

## 2015-07-31 DIAGNOSIS — C44729 Squamous cell carcinoma of skin of left lower limb, including hip: Secondary | ICD-10-CM

## 2015-07-31 DIAGNOSIS — I742 Embolism and thrombosis of arteries of the upper extremities: Secondary | ICD-10-CM | POA: Diagnosis not present

## 2015-07-31 DIAGNOSIS — M47892 Other spondylosis, cervical region: Secondary | ICD-10-CM

## 2015-07-31 DIAGNOSIS — D688 Other specified coagulation defects: Secondary | ICD-10-CM | POA: Diagnosis not present

## 2015-07-31 NOTE — Patient Instructions (Signed)
To lab today Return visit 1 year  

## 2015-07-31 NOTE — Progress Notes (Signed)
Patient ID: Tracy Peters, female   DOB: 03/01/1934, 82 y.o.   MRN: [id number] Hematology and Oncology Follow Up Visit  BRENNEN BRINGER [id number] [birth year] 82 y.o. 07/31/2015 12:14 PM   Principle Diagnosis: Encounter Diagnoses  Name Primary?  . 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[place]) Yes  . Squamous cell skin cancer   Clinical Summary: 82 year old woman I have followed for many years. She sustained an arterial embolus to an index finger of her right hand involving a right radial artery in 1999 which happened coincidentally with starting a Cox 2 inhibitor. She has been maintained on chronic Coumadin anticoagulation since that time and has had no subsequent thrombotic events.  She has known degenerative arthritis of the spine and has had 2 operations in the past. She tells me that her left knee is deteriorating and surgery also recommended on this in the near future.  She developed a cervical radiculopathysecondary to cervical stenosis and spondylolysis. She had successful surgery on her cervical spine with anterior cervical decompression and discectomy and a fusion at 3 levels by Dr. Newman Pies on January 18, 2014.  She had residual paresthesias in some of her fingers of the right hand. She was reevaluated by Dr. Arnoldo Morale and found to have carpal tunnel syndrome. She underwent a another surgical procedure on 06/12/2014. She has had significant improvement of the paresthesias now limited just to the middle finger.  Interim History:  Since visit with me last year she has had bilateral cataract surgery. More recently, she noted a change in a skin lesion in the left tibial region. She underwent biopsy on 02/27/2015. She was found to have a squamous cell carcinoma. Lesion was deep. She was referred to a plastic surgeon and had a wide excision with skin grafting. Review of systems otherwise remarkable for increasing fatigue. She frequently has to take a nap in the afternoon. Appetite is good.  Weight does fluctuate a lot. Weight recorded as 135 pounds last year, went up to 141 by July 2016, currently back to 135. Most recent Pap smear done every ninth 2016 by Dr. Matthew Saras. She has regular follow-up with her primary care physician and was just seen last month. She continues to have her Coumadin monitored through our office. Current dose 7.5 mg daily except 10 mg on Mondays and Wednesdays. She just lost a dog recently but husband got her 2 [place] terriers.  Medications: reviewed  Allergies:  Allergies  Allergen Reactions  . Bee Venom Anaphylaxis    Yellow jackets, wasps as well  . Ivp Dye [Iodinated Diagnostic Agents] Anaphylaxis  . Shellfish Allergy Anaphylaxis  . Amlodipine Besylate     REACTION: swelling  . Aspirin     REACTION: unspecified  . Atorvastatin     REACTION: diarrhea  . Cholestyramine     REACTION: mouth irritation  . Codeine Phosphate Nausea And Vomiting  . Demerol [Meperidine]     Severe Hallucination!!!!  . Diphenhydramine Hcl Other (See Comments)    hyperactive  . Doxycycline     REACTION: nausea  . Gentamicin Sulfate     REACTION: unspecified  . Meclizine Hcl     REACTION: more dizziness on it  . Meperidine Hcl   . Metronidazole     REACTION: bad taste  . Moxifloxacin     REACTION: insomnia Able to use Cipro  . Sulfamethoxazole     Kidney problems  . Penicillins Swelling and Rash    Review of Systems: See history of present illness Remaining ROS  negative:   Physical Exam: Blood pressure 147/73, pulse 86, temperature 98.1 F (36.7 C), temperature source Oral, height 5\' 5"  (1.651 m), weight 135 lb (61.236 kg), SpO2 99 %. Wt Readings from Last 3 Encounters:  07/31/15 135 lb (61.236 kg)  07/13/15 136 lb (61.689 kg)  06/15/15 137 lb (62.143 kg)     General appearance: Thin Caucasian woman HENNT: Pharynx no erythema, exudate, mass, or ulcer. No thyromegaly or thyroid nodules Lymph nodes: No cervical, supraclavicular, or axillary  lymphadenopathy Breasts: Lungs: Clear to auscultation, resonant to percussion throughout Heart: Regular rhythm, no murmur, no gallop, no rub, no click, no edema Abdomen: Soft, nontender, normal bowel sounds, no mass, no organomegaly Extremities: No edema, no calf tenderness. Healed scar medial lateral mid tibia left leg status post excision of squamous cell carcinoma. Musculoskeletal: no joint deformities GU:  Vascular: Carotid pulses 2+, no bruits, radial pulses 2+ symmetric. Ulnar pulses not palpable but symmetric. distal pulses: Dorsalis pedis 1+ symmetric Neurologic: Alert, oriented, PERRLA, optic discs sharp and vessels normal, no hemorrhage or exudate, cranial nerves grossly normal, motor strength 5 over 5, reflexes 1+ symmetric, upper body coordination normal, gait normal, Skin: No rash or ecchymosis. See extremity exam above  Lab Results: CBC W/Diff    Component Value Date/Time   WBC 9.5 08/01/2014 1119   WBC 6.7 03/21/2014 1058   RBC 3.72* 08/01/2014 1119   RBC 3.51* 03/21/2014 1058   HGB 13.5 08/01/2014 1119   HGB 12.7 03/21/2014 1058   HCT 40.5 08/01/2014 1119   HCT 38.7 03/21/2014 1058   PLT 293 08/01/2014 1119   PLT 286 03/21/2014 1058   MCV 108.9* 08/01/2014 1119   MCV 110.4* 03/21/2014 1058   MCH 36.3* 08/01/2014 1119   MCH 36.1* 03/21/2014 1058   MCHC 33.3 08/01/2014 1119   MCHC 32.7 03/21/2014 1058   RDW 14.2 08/01/2014 1119   RDW 13.3 03/21/2014 1058   LYMPHSABS 1.7 08/01/2014 1119   LYMPHSABS 1.6 03/21/2014 1058   MONOABS 0.7 08/01/2014 1119   MONOABS 0.6 03/21/2014 1058   EOSABS 0.0 08/01/2014 1119   EOSABS 0.1 03/21/2014 1058   BASOSABS 0.0 08/01/2014 1119   BASOSABS 0.0 03/21/2014 1058     Chemistry      Component Value Date/Time   NA 134* 06/01/2014 1331   NA 132* 03/21/2014 1100   K 4.7 06/01/2014 1331   K 4.9 03/21/2014 1100   CL 96 06/01/2014 1331   CL 97* 03/16/2012 1344   CO2 26 06/01/2014 1331   CO2 26 03/21/2014 1100   BUN 14  06/01/2014 1331   BUN 16.0 03/21/2014 1100   CREATININE 0.84 06/01/2014 1331   CREATININE 0.9 03/21/2014 1100      Component Value Date/Time   CALCIUM 9.9 06/01/2014 1331   CALCIUM 9.9 03/21/2014 1100   ALKPHOS 51 03/21/2014 1100   ALKPHOS 44 05/31/2012 0921   AST 22 03/21/2014 1100   AST 26 05/31/2012 0921   ALT 15 03/21/2014 1100   ALT 22 05/31/2012 0921   BILITOT 0.36 03/21/2014 1100   BILITOT 0.7 05/31/2012 [id number]       Radiological Studies: No results found.  Impression:  #1. 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 No subsequent thrombotic events on long-term Coumadin anticoagulation. Plan continue the same.  #2. Degenerative arthritis of the spine status post cervical discectomy with fusion procedure and plate stabilization C4 through C7 August 2015  #3. Carpal tunnel syndrome status post right carpal tunnel  repair in December 2015  #4. Chronic mild macrocytic anemia with normal 123456 and folic acid levels  #5. Essential hypertension  #6. Irritable bowel syndrome  #7. History of genital herpes  #8. 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 Initial biopsy 02/27/2015.   CC: Patient Care Team: Cassandria Anger, MD as PCP - General Justice Britain, MD (Orthopedic Surgery) Annia Belt, MD (Hematology and Oncology) Molli Posey, MD (Obstetrics and Gynecology) Lafayette Dragon, MD (Gastroenterology) Newman Pies, MD as Consulting Physician (Neurosurgery) Gaynelle Arabian, MD as Consulting Physician (Orthopedic Surgery) Newman Pies, MD as Consulting Physician (Neurosurgery)   Annia Belt, MD 2/14/201712:14 PM

## 2015-08-01 LAB — CBC WITH DIFFERENTIAL/PLATELET
BASOS ABS: 0 10*3/uL (ref 0.0–0.2)
Basos: 0 %
EOS (ABSOLUTE): 0 10*3/uL (ref 0.0–0.4)
Eos: 1 %
HEMOGLOBIN: 12.5 g/dL (ref 11.1–15.9)
Hematocrit: 36.4 % (ref 34.0–46.6)
IMMATURE GRANS (ABS): 0 10*3/uL (ref 0.0–0.1)
IMMATURE GRANULOCYTES: 0 %
LYMPHS: 23 %
Lymphocytes Absolute: 1.9 10*3/uL (ref 0.7–3.1)
MCH: 37.3 pg — AB (ref 26.6–33.0)
MCHC: 34.3 g/dL (ref 31.5–35.7)
MCV: 109 fL — ABNORMAL HIGH (ref 79–97)
MONOCYTES: 9 %
Monocytes Absolute: 0.8 10*3/uL (ref 0.1–0.9)
NEUTROS ABS: 5.5 10*3/uL (ref 1.4–7.0)
NEUTROS PCT: 67 %
PLATELETS: 328 10*3/uL (ref 150–379)
RBC: 3.35 x10E6/uL — ABNORMAL LOW (ref 3.77–5.28)
RDW: 13.6 % (ref 12.3–15.4)
WBC: 8.3 10*3/uL (ref 3.4–10.8)

## 2015-08-01 LAB — COMPREHENSIVE METABOLIC PANEL
ALBUMIN: 4.3 g/dL (ref 3.5–4.8)
ALK PHOS: 50 IU/L (ref 39–117)
ALT: 22 IU/L (ref 0–32)
AST: 33 IU/L (ref 0–40)
Albumin/Globulin Ratio: 1.7 (ref 1.1–2.5)
BILIRUBIN TOTAL: 0.5 mg/dL (ref 0.0–1.2)
BUN / CREAT RATIO: 15 (ref 11–26)
BUN: 13 mg/dL (ref 8–27)
CHLORIDE: 93 mmol/L — AB (ref 96–106)
CO2: 24 mmol/L (ref 18–29)
CREATININE: 0.85 mg/dL (ref 0.57–1.00)
Calcium: 9.6 mg/dL (ref 8.7–10.3)
GFR calc non Af Amer: 65 mL/min/{1.73_m2} (ref >59)
GFR, EST AFRICAN AMERICAN: 75 mL/min/{1.73_m2} (ref >59)
GLUCOSE: 98 mg/dL (ref 65–99)
Globulin, Total: 2.6 g/dL (ref 1.5–4.5)
Potassium: 5.1 mmol/L (ref 3.5–5.2)
Sodium: 132 mmol/L — ABNORMAL LOW (ref 134–144)
TOTAL PROTEIN: 6.9 g/dL (ref 6.0–8.5)

## 2015-08-06 ENCOUNTER — Telehealth: Payer: Self-pay | Admitting: *Deleted

## 2015-08-06 NOTE — Telephone Encounter (Signed)
-----   Message from Annia Belt, MD sent at 08/01/2015  8:38 AM EST ----- Call pt: lab all stable compared with previous: she tends to run a slightly low sodium; her red blood cells are large but 123456 & folic acid levels have been normal: deficiencies of these vitamins are the most common cause of large red cells.  This finding is non specific and her total red count (Hemoglobin) is stable.

## 2015-08-06 NOTE — Telephone Encounter (Signed)
Pt called / informed "lab all stable compared with previous: she tends to run a slightly low sodium; her red blood cells are large but 123456 & folic acid levels have been normal: deficiencies of these vitamins are the most common cause of large red cells. This finding is non specific and her total red count (Hemoglobin) is stable." per Dr Beryle Beams.

## 2015-08-20 ENCOUNTER — Ambulatory Visit (INDEPENDENT_AMBULATORY_CARE_PROVIDER_SITE_OTHER): Payer: 59 | Admitting: Pharmacist

## 2015-08-20 ENCOUNTER — Ambulatory Visit: Payer: 59

## 2015-08-20 DIAGNOSIS — Z7901 Long term (current) use of anticoagulants: Secondary | ICD-10-CM | POA: Diagnosis not present

## 2015-08-20 LAB — POCT INR: INR: 3.1

## 2015-08-20 NOTE — Progress Notes (Signed)
Reviewed Thanks DrG 

## 2015-08-20 NOTE — Patient Instructions (Signed)
Patient instructed to take medications as defined in the Anti-coagulation Track section of this encounter.  Patient instructed to take today's dose.  Patient verbalized understanding of these instructions.    

## 2015-08-20 NOTE — Progress Notes (Signed)
Anti-Coagulation Progress Note  Tracy Peters is a 82 y.o. female who is currently on an anti-coagulation regimen.    RECENT RESULTS: Recent results are below, the most recent result is correlated with a dose of 50 mg. per week: Lab Results  Component Value Date   INR 3.10 08/20/2015   INR 3.40 07/10/2015   INR 2.90 05/28/2015   PROTIME 39.6* 04/03/2015    ANTI-COAG DOSE: Anticoagulation Dose Instructions as of 08/20/2015      Sun Mon Tue Wed Thu Fri Sat   New Dose 7.5 mg 5 mg 7.5 mg 5 mg 7.5 mg 5 mg 7.5 mg       ANTICOAG SUMMARY: Anticoagulation Episode Summary    Current INR goal 2.0-3.0   Next INR check 10/01/2015   INR from last check 3.10! (08/20/2015)   Weekly max dose    Target end date    INR check location    Preferred lab    Send INR reminders to       Comments       Anticoagulation Care Providers    Provider Role Specialty Phone number   Annia Belt, MD Referring Oncology [phone]      ANTICOAG TODAY: Anticoagulation Summary as of 08/20/2015    INR goal 2.0-3.0   Selected INR 3.10! (08/20/2015)   Next INR check 10/01/2015   Target end date     Anticoagulation Episode Summary    INR check location    Preferred lab    Send INR reminders to    Comments     Anticoagulation Care Providers    Provider Role Specialty Phone number   Annia Belt, MD Referring Oncology [phone]      PATIENT INSTRUCTIONS: Patient Instructions  Patient instructed to take medications as defined in the Anti-coagulation Track section of this encounter.  Patient instructed to take today's dose.  Patient verbalized understanding of these instructions.       FOLLOW-UP Return in 6 weeks (on 10/01/2015) for Follow up INR at 1130.  Jorene Guest, III Pharm.D., CACP

## 2015-08-31 ENCOUNTER — Telehealth: Payer: Self-pay | Admitting: *Deleted

## 2015-08-31 MED ORDER — NITROFURANTOIN MONOHYD MACRO 100 MG PO CAPS: 100.0000 mg | ORAL_CAPSULE | Freq: Two times a day (BID) | ORAL | Status: DC

## 2015-08-31 NOTE — Telephone Encounter (Signed)
Left msg on triage stating she is starting to get the pressure & discolored urine as before when she had UTI couple weeks ago. Dr. Ronnald Ramp gave her Macrobid. Wanting to see if MD can rx or refill antibiotic that was given until she see him next week...Tracy Peters

## 2015-08-31 NOTE — Telephone Encounter (Signed)
Notified pt md ok refill verified pharmacy inform sending to [facility] electronically...Tracy Peters

## 2015-08-31 NOTE — Telephone Encounter (Signed)
Ok to fill Thx 

## 2015-09-07 IMAGING — CR DG CHEST 2V
2 series · 2 of 2 positions shown · non-contrast
Comparison: 06/01/2008

CLINICAL DATA: Preop neck surgery

EXAM:
CHEST  2 VIEW

[w chest pa]
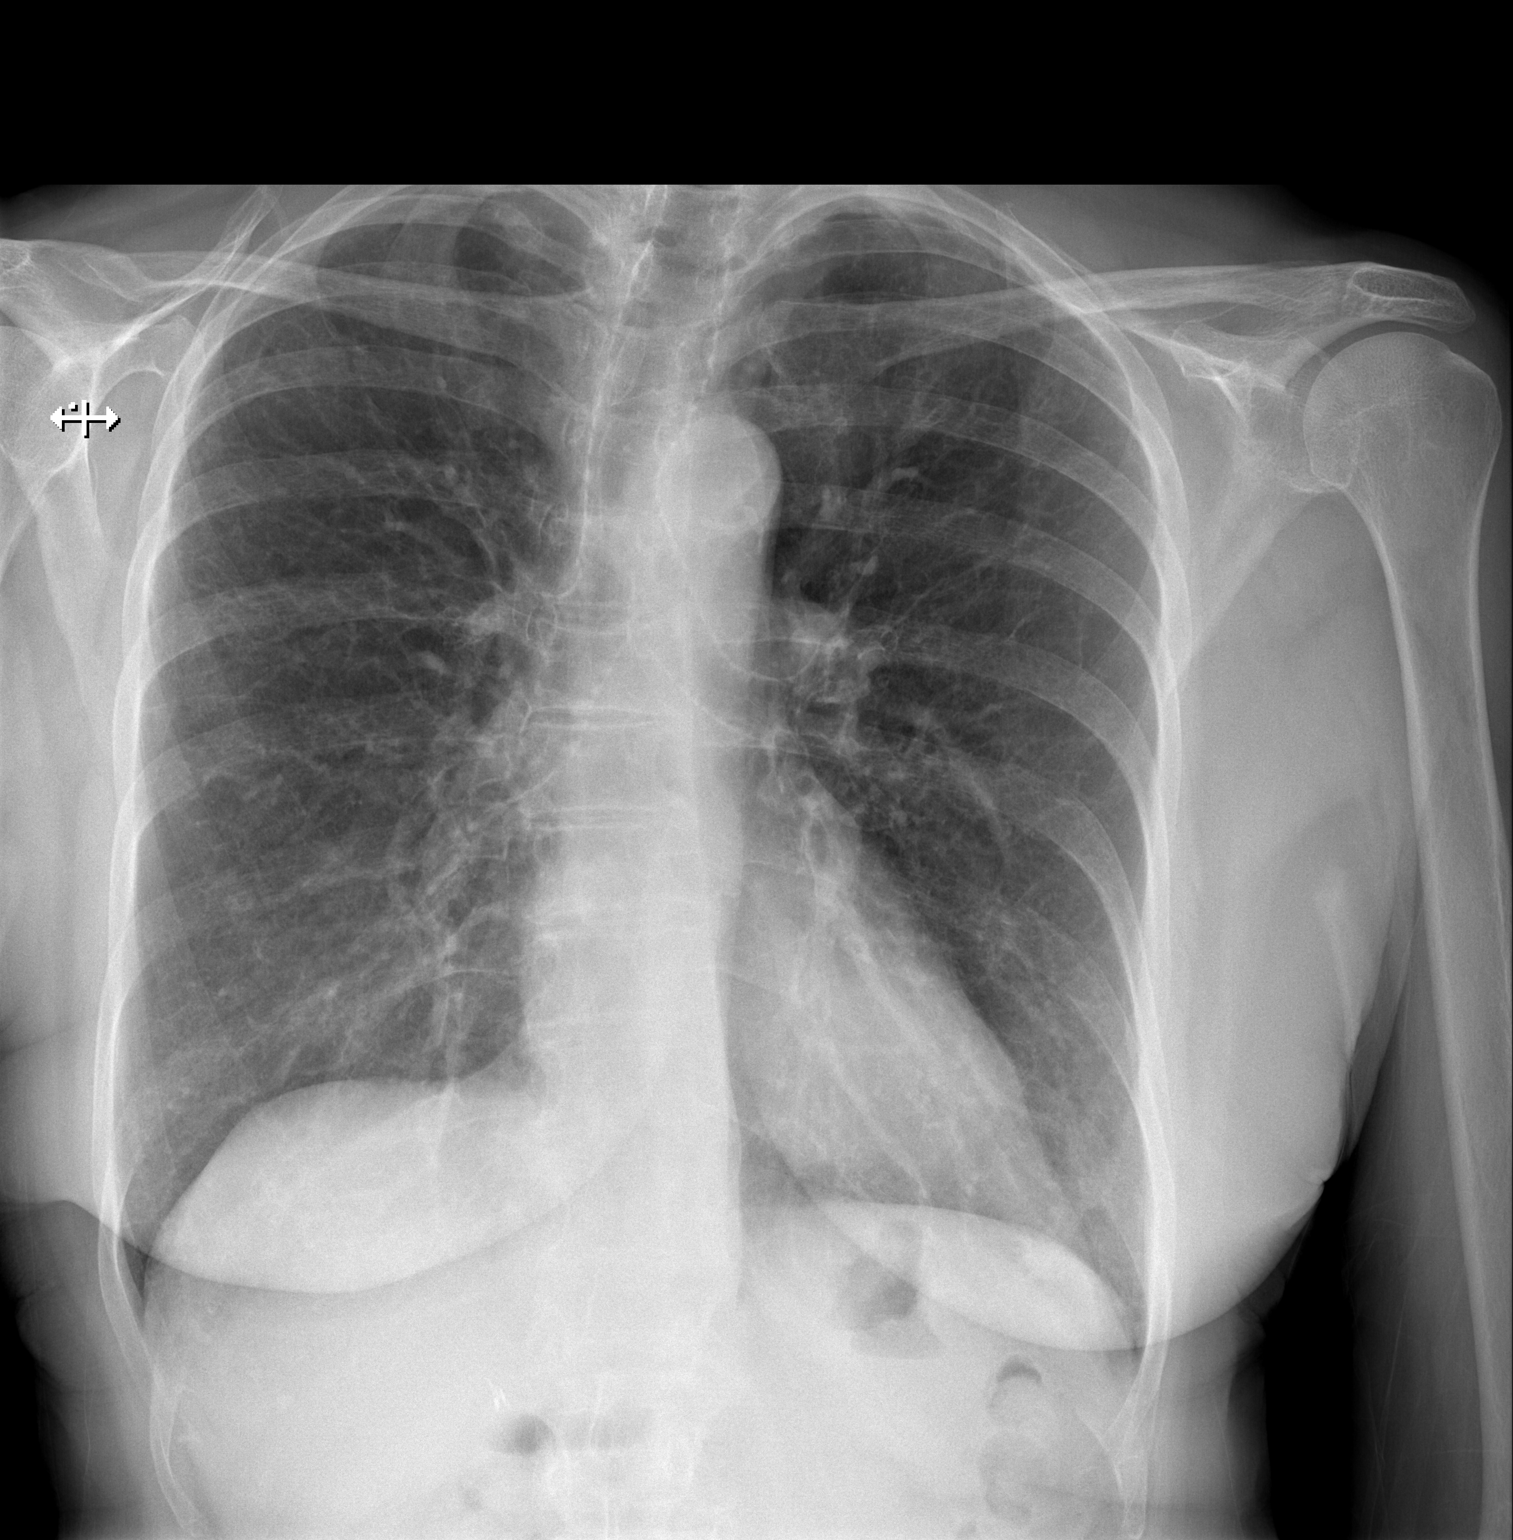

[w chest lat]
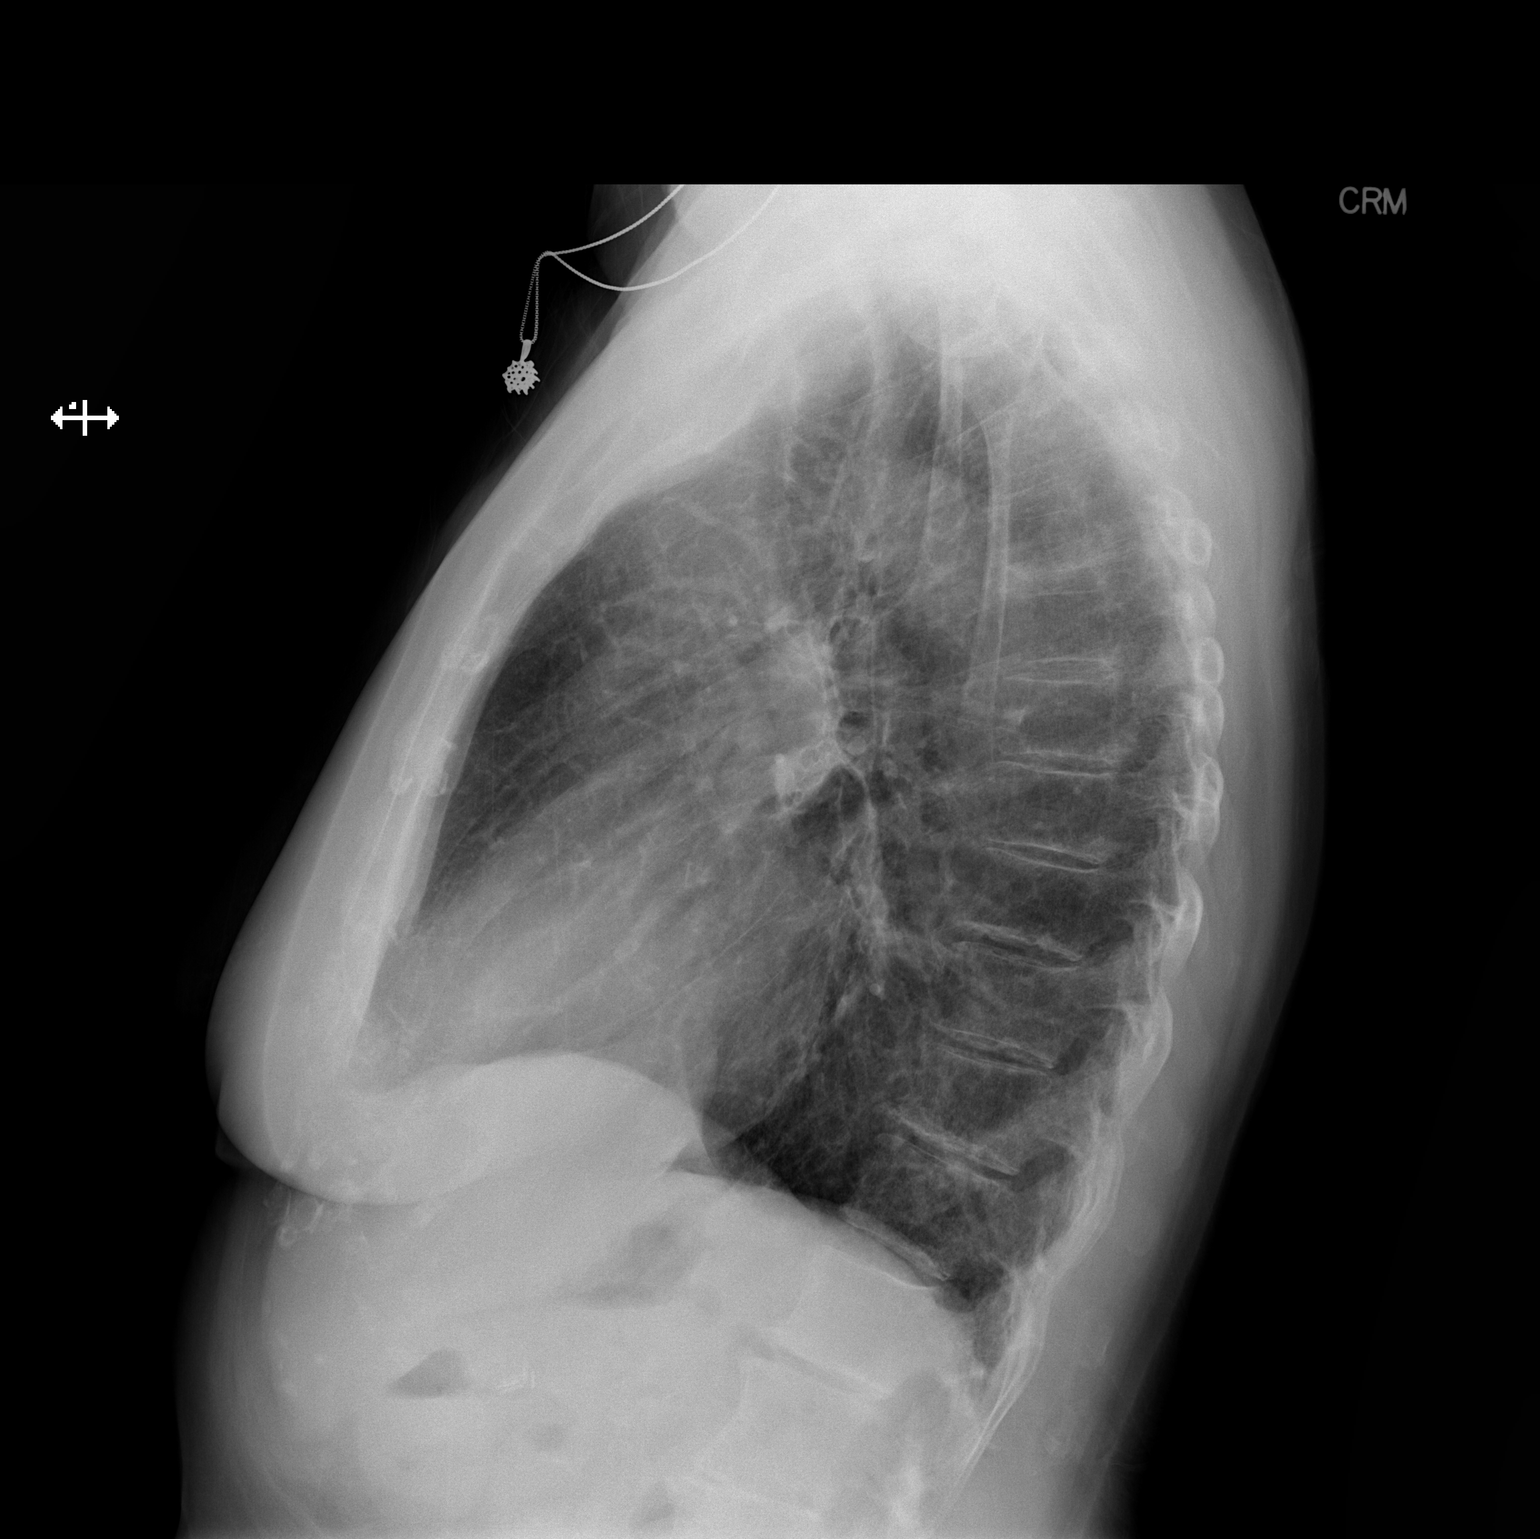

[2 of 2 positions shown; findings below may reference images not displayed]

FINDINGS: The heart size and mediastinal contours are within normal limits.
Both lungs are clear. The visualized skeletal structures are
unremarkable.
IMPRESSION: No active cardiopulmonary disease.

## 2015-09-14 IMAGING — CR DG CERVICAL SPINE 2 OR 3 VIEWS
1 series · 1 of 1 positions shown · non-contrast
Comparison: MR Esai of 10/12/2013

CLINICAL DATA: Anterior cervical spine fusion

EXAM:
CERVICAL SPINE - 2-3 VIEW

[lat]
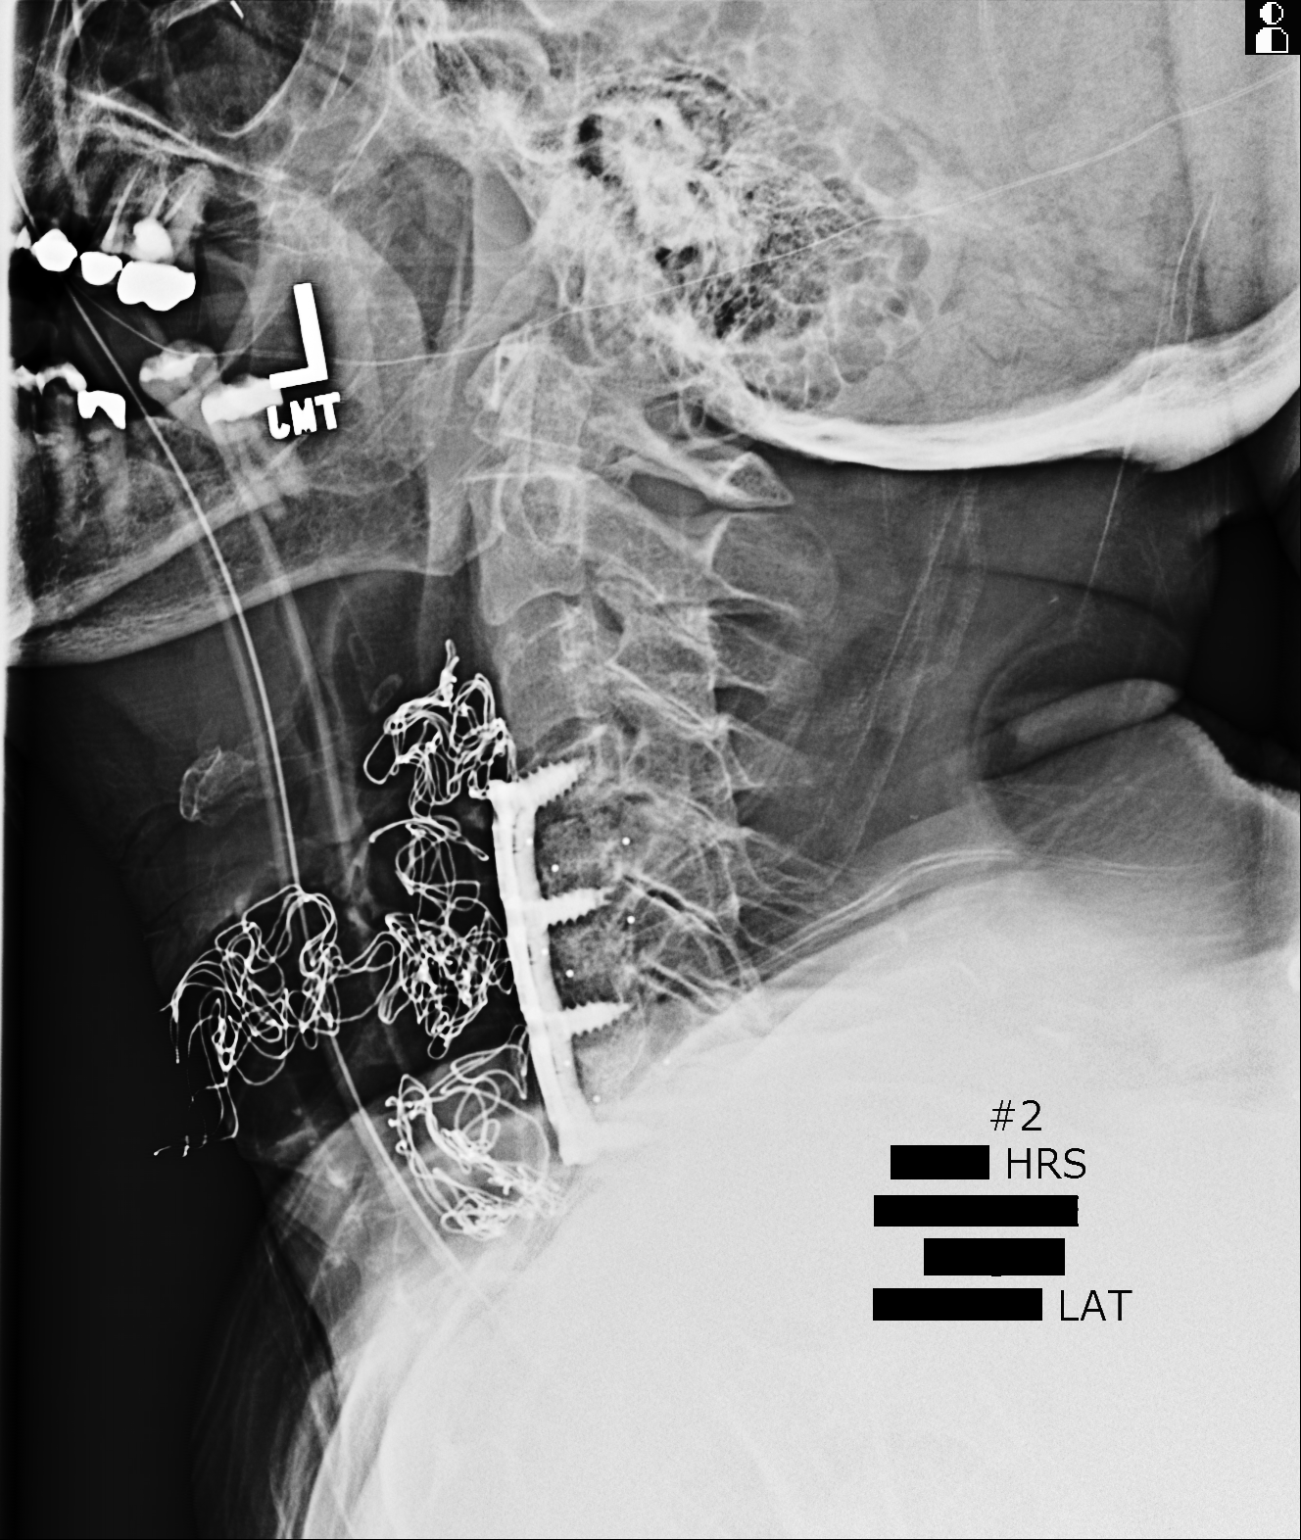

[1 of 1 positions shown; findings below may reference images not displayed]

FINDINGS: A cross-table lateral portable view from the operating room labeled
#1 shows a needle positioned anteriorly localizing the C3-4
interspace.

A second cross-table lateral view labeled #2 shows anterior fusion
from C4-C7 with normal alignment.
IMPRESSION: Anterior fusion from C4-C7 on the last film with normal alignment.

## 2015-10-01 ENCOUNTER — Ambulatory Visit: Payer: 59

## 2015-10-08 ENCOUNTER — Ambulatory Visit (INDEPENDENT_AMBULATORY_CARE_PROVIDER_SITE_OTHER): Payer: 59 | Admitting: Pharmacist

## 2015-10-08 DIAGNOSIS — Z7901 Long term (current) use of anticoagulants: Secondary | ICD-10-CM | POA: Diagnosis not present

## 2015-10-08 LAB — POCT INR: INR: 1.7

## 2015-10-08 NOTE — Patient Instructions (Signed)
Patient instructed to take medications as defined in the Anti-coagulation Track section of this encounter.  Patient instructed to take today's dose.  Patient verbalized understanding of these instructions.    

## 2015-10-08 NOTE — Progress Notes (Signed)
Anti-Coagulation Progress Note  Tracy Peters is a 82 y.o. female who is currently on an anti-coagulation regimen.    RECENT RESULTS: Recent results are below, the most recent result is correlated with a dose of 45 mg. per week: Lab Results  Component Value Date   INR 1.70 10/08/2015   INR 3.10 08/20/2015   INR 3.40 07/10/2015   PROTIME 39.6* 04/03/2015    ANTI-COAG DOSE: Anticoagulation Dose Instructions as of 10/08/2015      Dorene Grebe Tue Wed Thu Fri Sat   New Dose 7.5 mg 7.5 mg 7.5 mg 5 mg 7.5 mg 5 mg 7.5 mg       ANTICOAG SUMMARY: Anticoagulation Episode Summary    Current INR goal 2.0-3.0   Next INR check 12/03/2015   INR from last check 1.70! (10/08/2015)   Weekly max dose    Target end date    INR check location    Preferred lab    Send INR reminders to       Comments       Anticoagulation Care Providers    Provider Role Specialty Phone number   Annia Belt, MD Referring Oncology [phone]      ANTICOAG TODAY: Anticoagulation Summary as of 10/08/2015    INR goal 2.0-3.0   Selected INR 1.70! (10/08/2015)   Next INR check 12/03/2015   Target end date     Anticoagulation Episode Summary    INR check location    Preferred lab    Send INR reminders to    Comments     Anticoagulation Care Providers    Provider Role Specialty Phone number   Annia Belt, MD Referring Oncology [phone]      PATIENT INSTRUCTIONS: Patient Instructions  Patient instructed to take medications as defined in the Anti-coagulation Track section of this encounter.  Patient instructed to take today's dose.  Patient verbalized understanding of these instructions.       FOLLOW-UP Return in 8 weeks (on 12/03/2015) for Follow up INR at 1130h.  Jorene Guest, III Pharm.D., CACP

## 2015-10-08 NOTE — Progress Notes (Signed)
Reviewed Thanks DrG 

## 2015-11-27 DIAGNOSIS — H26491 Other secondary cataract, right eye: Secondary | ICD-10-CM | POA: Diagnosis not present

## 2015-11-27 DIAGNOSIS — H26492 Other secondary cataract, left eye: Secondary | ICD-10-CM | POA: Diagnosis not present

## 2015-12-03 ENCOUNTER — Ambulatory Visit (INDEPENDENT_AMBULATORY_CARE_PROVIDER_SITE_OTHER): Payer: 59 | Admitting: Pharmacist

## 2015-12-03 DIAGNOSIS — I749 Embolism and thrombosis of unspecified artery: Secondary | ICD-10-CM

## 2015-12-03 DIAGNOSIS — Z7901 Long term (current) use of anticoagulants: Secondary | ICD-10-CM | POA: Diagnosis not present

## 2015-12-03 LAB — POCT INR: INR: 2

## 2015-12-03 NOTE — Progress Notes (Signed)
Anti-Coagulation Progress Note  Tracy Peters is a 82 y.o. female who is currently on an anti-coagulation regimen.    RECENT RESULTS: Recent results are below, the most recent result is correlated with a dose of 47.5 mg. per week: Lab Results  Component Value Date   INR 2.00 12/03/2015   INR 1.70 10/08/2015   INR 3.10 08/20/2015   PROTIME 39.6* 04/03/2015    ANTI-COAG DOSE: Anticoagulation Dose Instructions as of 12/03/2015      [name] Mon Tue Wed Thu Fri Sat   New Dose 7.5 mg 7.5 mg 7.5 mg 7.5 mg 7.5 mg 7.5 mg 7.5 mg       ANTICOAG SUMMARY: Anticoagulation Episode Summary    Current INR goal 2.0-3.0   Next INR check 01/28/2016   INR from last check 2.00 (12/03/2015)   Weekly max dose    Target end date    INR check location    Preferred lab    Send INR reminders to       Comments       Anticoagulation Care Providers    Provider Role Specialty Phone number   Annia Belt, MD Referring Oncology [phone]      ANTICOAG TODAY: Anticoagulation Summary as of 12/03/2015    INR goal 2.0-3.0   Selected INR 2.00 (12/03/2015)   Next INR check 01/28/2016   Target end date     Anticoagulation Episode Summary    INR check location    Preferred lab    Send INR reminders to    Comments     Anticoagulation Care Providers    Provider Role Specialty Phone number   Annia Belt, MD Referring Oncology [phone]      PATIENT INSTRUCTIONS: Patient Instructions  Patient instructed to take medications as defined in the Anti-coagulation Track section of this encounter.  Patient instructed to take today's dose.  Patient verbalized understanding of these instructions.       FOLLOW-UP Return in 8 weeks (on 01/28/2016) for Follow up INR at 1130h.  Jorene Guest, III Pharm.D., CACP

## 2015-12-03 NOTE — Progress Notes (Signed)
Reviewed Thanks DrG 

## 2015-12-03 NOTE — Patient Instructions (Signed)
Patient instructed to take medications as defined in the Anti-coagulation Track section of this encounter.  Patient instructed to take today's dose.  Patient verbalized understanding of these instructions.    

## 2015-12-12 DIAGNOSIS — Z01419 Encounter for gynecological examination (general) (routine) without abnormal findings: Secondary | ICD-10-CM | POA: Diagnosis not present

## 2015-12-12 DIAGNOSIS — Z1231 Encounter for screening mammogram for malignant neoplasm of breast: Secondary | ICD-10-CM | POA: Diagnosis not present

## 2015-12-12 DIAGNOSIS — Z6822 Body mass index (BMI) 22.0-22.9, adult: Secondary | ICD-10-CM | POA: Diagnosis not present

## 2016-01-03 ENCOUNTER — Other Ambulatory Visit (INDEPENDENT_AMBULATORY_CARE_PROVIDER_SITE_OTHER): Payer: 59

## 2016-01-03 DIAGNOSIS — I10 Essential (primary) hypertension: Secondary | ICD-10-CM

## 2016-01-03 DIAGNOSIS — G4700 Insomnia, unspecified: Secondary | ICD-10-CM

## 2016-01-03 DIAGNOSIS — M5440 Lumbago with sciatica, unspecified side: Secondary | ICD-10-CM

## 2016-01-03 DIAGNOSIS — E785 Hyperlipidemia, unspecified: Secondary | ICD-10-CM | POA: Diagnosis not present

## 2016-01-03 DIAGNOSIS — C4922 Malignant neoplasm of connective and soft tissue of left lower limb, including hip: Secondary | ICD-10-CM

## 2016-01-03 DIAGNOSIS — G8929 Other chronic pain: Secondary | ICD-10-CM

## 2016-01-03 DIAGNOSIS — Z0000 Encounter for general adult medical examination without abnormal findings: Secondary | ICD-10-CM

## 2016-01-03 LAB — CBC WITH DIFFERENTIAL/PLATELET
BASOS ABS: 0 10*3/uL (ref 0.0–0.1)
Basophils Relative: 0.4 % (ref 0.0–3.0)
EOS ABS: 0.1 10*3/uL (ref 0.0–0.7)
Eosinophils Relative: 0.7 % (ref 0.0–5.0)
HEMATOCRIT: 36.8 % (ref 36.0–46.0)
Hemoglobin: 12.3 g/dL (ref 12.0–15.0)
LYMPHS ABS: 2.1 10*3/uL (ref 0.7–4.0)
LYMPHS PCT: 21.5 % (ref 12.0–46.0)
MCHC: 33.4 g/dL (ref 30.0–36.0)
MCV: 109.9 fl — ABNORMAL HIGH (ref 78.0–100.0)
Monocytes Absolute: 0.8 10*3/uL (ref 0.1–1.0)
Monocytes Relative: 7.8 % (ref 3.0–12.0)
NEUTROS ABS: 6.8 10*3/uL (ref 1.4–7.7)
NEUTROS PCT: 69.6 % (ref 43.0–77.0)
PLATELETS: 307 10*3/uL (ref 150.0–400.0)
RBC: 3.35 Mil/uL — ABNORMAL LOW (ref 3.87–5.11)
RDW: 14.3 % (ref 11.5–15.5)
WBC: 9.8 10*3/uL (ref 4.0–10.5)

## 2016-01-03 LAB — TSH: TSH: 2.69 u[IU]/mL (ref 0.35–4.50)

## 2016-01-03 LAB — BASIC METABOLIC PANEL
BUN: 14 mg/dL (ref 6–23)
CALCIUM: 9.7 mg/dL (ref 8.4–10.5)
CO2: 28 mEq/L (ref 19–32)
Chloride: 100 mEq/L (ref 96–112)
Creatinine, Ser: 0.96 mg/dL (ref 0.40–1.20)
GFR: 59.38 mL/min — AB (ref >60.00)
GLUCOSE: 85 mg/dL (ref 70–99)
POTASSIUM: 4.6 meq/L (ref 3.5–5.1)
SODIUM: 136 meq/L (ref 135–145)

## 2016-01-03 LAB — URINALYSIS, ROUTINE W REFLEX MICROSCOPIC
BILIRUBIN URINE: NEGATIVE
Ketones, ur: NEGATIVE
LEUKOCYTES UA: NEGATIVE
Nitrite: NEGATIVE
PH: 6 (ref 5.0–8.0)
Specific Gravity, Urine: <=1.005 — AB (ref 1.000–1.030)
Total Protein, Urine: NEGATIVE
Urine Glucose: NEGATIVE
Urobilinogen, UA: 0.2 (ref 0.0–1.0)
WBC, UA: NONE SEEN (ref 0–?)

## 2016-01-03 LAB — HEPATIC FUNCTION PANEL
ALBUMIN: 4 g/dL (ref 3.5–5.2)
ALT: 17 U/L (ref 0–35)
AST: 22 U/L (ref 0–37)
Alkaline Phosphatase: 47 U/L (ref 39–117)
BILIRUBIN DIRECT: 0.1 mg/dL (ref 0.0–0.3)
BILIRUBIN TOTAL: 0.5 mg/dL (ref 0.2–1.2)
Total Protein: 7.3 g/dL (ref 6.0–8.3)

## 2016-01-03 LAB — LIPID PANEL
Cholesterol: 196 mg/dL (ref 0–200)
HDL: 63.1 mg/dL (ref >39.00)
LDL CALC: 116 mg/dL — AB (ref 0–99)
NONHDL: 132.69
Total CHOL/HDL Ratio: 3
Triglycerides: 85 mg/dL (ref 0.0–149.0)
VLDL: 17 mg/dL (ref 0.0–40.0)

## 2016-01-07 ENCOUNTER — Other Ambulatory Visit: Payer: Self-pay | Admitting: Internal Medicine

## 2016-01-08 ENCOUNTER — Ambulatory Visit (INDEPENDENT_AMBULATORY_CARE_PROVIDER_SITE_OTHER): Payer: 59 | Admitting: Internal Medicine

## 2016-01-08 ENCOUNTER — Encounter: Payer: Self-pay | Admitting: Internal Medicine

## 2016-01-08 DIAGNOSIS — K219 Gastro-esophageal reflux disease without esophagitis: Secondary | ICD-10-CM | POA: Diagnosis not present

## 2016-01-08 DIAGNOSIS — I10 Essential (primary) hypertension: Secondary | ICD-10-CM

## 2016-01-08 DIAGNOSIS — G8929 Other chronic pain: Secondary | ICD-10-CM | POA: Diagnosis not present

## 2016-01-08 DIAGNOSIS — I749 Embolism and thrombosis of unspecified artery: Secondary | ICD-10-CM

## 2016-01-08 DIAGNOSIS — M5440 Lumbago with sciatica, unspecified side: Secondary | ICD-10-CM

## 2016-01-08 MED ORDER — ZOLPIDEM TARTRATE 10 MG PO TABS: 15.0000 mg | ORAL_TABLET | Freq: Every evening | ORAL | 5 refills | Status: DC | PRN

## 2016-01-08 MED ORDER — LOSARTAN POTASSIUM 100 MG PO TABS: 100.0000 mg | ORAL_TABLET | Freq: Every day | ORAL | 3 refills | Status: DC

## 2016-01-08 MED ORDER — VALACYCLOVIR HCL 1 G PO TABS: 1000.0000 mg | ORAL_TABLET | Freq: Two times a day (BID) | ORAL | 11 refills | Status: DC

## 2016-01-08 MED ORDER — FLUTICASONE PROPIONATE 50 MCG/ACT NA SUSP: 2.0000 | Freq: Every day | NASAL | 0 refills | Status: DC

## 2016-01-08 MED ORDER — TRAMADOL HCL 50 MG PO TABS: 50.0000 mg | ORAL_TABLET | Freq: Two times a day (BID) | ORAL | 5 refills | Status: DC | PRN

## 2016-01-08 MED ORDER — WARFARIN SODIUM 5 MG PO TABS: ORAL_TABLET | ORAL | 11 refills | Status: DC

## 2016-01-08 MED ORDER — EPINEPHRINE 0.3 MG/0.3ML IJ SOAJ: INTRAMUSCULAR | 3 refills | Status: DC

## 2016-01-08 NOTE — Progress Notes (Signed)
Pre visit review using our clinic review tool, if applicable. No additional management support is needed unless otherwise documented below in the visit note. 

## 2016-01-08 NOTE — Progress Notes (Signed)
Subjective:  Patient ID: Tracy Peters, female    DOB: 02/07/1934  Age: 82 y.o. MRN: [id number]  CC: No chief complaint on file.   HPI Tracy Peters presents for a 6 month f/u - allergies, LBP, HTN, diarrhea f/u  Outpatient Medications Prior to Visit  Medication Sig Dispense Refill  . acyclovir ointment (ZOVIRAX) 5 % Apply 1 application topically daily as needed (for outbreak). 15 g 2  . diphenoxylate-atropine (LOMOTIL) 2.5-0.025 MG tablet Take 1 tablet by mouth 2 (two) times daily as needed for diarrhea or loose stools. 30 tablet 2  . docusate sodium 100 MG CAPS Take 100 mg by mouth 2 (two) times daily. 60 capsule 0  . EPINEPHrine 0.3 mg/0.3 mL IJ SOAJ injection As dirrected 1 Device 3  . fexofenadine (ALLEGRA) 180 MG tablet Take 180 mg by mouth daily as needed for allergies.     . fish oil-omega-3 fatty acids 1000 MG capsule Take 2 g by mouth daily.     . fluticasone (FLONASE) 50 MCG/ACT nasal spray USE 2 SPRAYS IN EACH NOSTRIL ONCE DAILY 16 g 0  . loperamide (IMODIUM A-D) 2 MG tablet Take 2 mg by mouth 4 (four) times daily as needed for diarrhea or loose stools. Will take one by mouth 65min before meals or two as needed.    Marland Kitchen losartan (COZAAR) 100 MG tablet Take 1 tablet (100 mg total) by mouth daily. 90 tablet 3  . nitrofurantoin, macrocrystal-monohydrate, (MACROBID) 100 MG capsule Take 1 capsule (100 mg total) by mouth 2 (two) times daily. 20 capsule 0  . Probiotic Product (ALIGN) 4 MG CAPS Take by mouth daily.      . traMADol (ULTRAM) 50 MG tablet Take 1-2 tablets (50-100 mg total) by mouth every 12 (twelve) hours as needed for moderate pain. 90 tablet 5  . triamcinolone ointment (KENALOG) 0.5 % Apply topically 4 (four) times daily. 30 g 3  . valACYclovir (VALTREX) 1000 MG tablet Take 1 tablet (1,000 mg total) by mouth 2 (two) times daily. 60 tablet 11  . warfarin (COUMADIN) 5 MG tablet Take 7.5 mg on Tues, Thur, Fri, Sat, Sun. Take 10 mg on Mon and Wed. 60 tablet 11  . zolpidem  (AMBIEN) 10 MG tablet Take 1.5 tablets (15 mg total) by mouth at bedtime as needed for sleep. Ok to repeat prn in 4 hrs 135 tablet 5   No facility-administered medications prior to visit.     ROS Review of Systems  Constitutional: Negative for activity change, appetite change, chills, fatigue and unexpected weight change.  HENT: Negative for congestion, mouth sores and sinus pressure.   Eyes: Negative for visual disturbance.  Respiratory: Negative for cough and chest tightness.   Gastrointestinal: Negative for abdominal pain and nausea.  Genitourinary: Negative for difficulty urinating, frequency and vaginal pain.  Musculoskeletal: Positive for arthralgias. Negative for back pain and gait problem.  Skin: Negative for pallor and rash.  Neurological: Negative for dizziness, tremors, weakness, numbness and headaches.  Psychiatric/Behavioral: Negative for confusion, sleep disturbance and suicidal ideas.    Objective:  BP (!) 180/90   Pulse 72   Wt 130 lb (59 kg)   SpO2 98%   BMI 21.63 kg/m   BP Readings from Last 3 Encounters:  01/08/16 (!) 180/90  07/31/15 (!) 147/73  07/13/15 (!) 160/90    Wt Readings from Last 3 Encounters:  01/08/16 130 lb (59 kg)  07/31/15 135 lb (61.2 kg)  07/13/15 136 lb (61.7 kg)  Physical Exam  Constitutional: She appears well-developed. No distress.  HENT:  Head: Normocephalic.  Right Ear: External ear normal.  Left Ear: External ear normal.  Nose: Nose normal.  Mouth/Throat: Oropharynx is clear and moist.  Eyes: Conjunctivae are normal. Pupils are equal, round, and reactive to light. Right eye exhibits no discharge. Left eye exhibits no discharge.  Neck: Normal range of motion. Neck supple. No JVD present. No tracheal deviation present. No thyromegaly present.  Cardiovascular: Normal rate, regular rhythm and normal heart sounds.   Pulmonary/Chest: No stridor. No respiratory distress. She has no wheezes.  Abdominal: Soft. Bowel sounds are  normal. She exhibits no distension and no mass. There is no tenderness. There is no rebound and no guarding.  Musculoskeletal: She exhibits tenderness. She exhibits no edema.  Lymphadenopathy:    She has no cervical adenopathy.  Neurological: She displays normal reflexes. No cranial nerve deficit. She exhibits normal muscle tone. Coordination normal.  Skin: No rash noted. No erythema.  Psychiatric: She has a normal mood and affect. Her behavior is normal. Judgment and thought content normal.    Lab Results  Component Value Date   WBC 9.8 01/03/2016   HGB 12.3 01/03/2016   HCT 36.8 01/03/2016   PLT 307.0 01/03/2016   GLUCOSE 85 01/03/2016   CHOL 196 01/03/2016   TRIG 85.0 01/03/2016   HDL 63.10 01/03/2016   LDLDIRECT 129.8 05/31/2012   LDLCALC 116 (H) 01/03/2016   ALT 17 01/03/2016   AST 22 01/03/2016   NA 136 01/03/2016   K 4.6 01/03/2016   CL 100 01/03/2016   CREATININE 0.96 01/03/2016   BUN 14 01/03/2016   CO2 28 01/03/2016   TSH 2.69 01/03/2016   INR 2.00 12/03/2015    No results found.  Assessment & Plan:   There are no diagnoses linked to this encounter. I am having Ms. Wawrzyniak maintain her ALIGN, loperamide, fish oil-omega-3 fatty acids, fexofenadine, DSS, acyclovir ointment, diphenoxylate-atropine, EPINEPHrine, losartan, traMADol, triamcinolone ointment, valACYclovir, warfarin, zolpidem, nitrofurantoin (macrocrystal-monohydrate), and fluticasone.  No orders of the defined types were placed in this encounter.    Follow-up: No Follow-up on file.  [name], MD

## 2016-01-08 NOTE — Assessment & Plan Note (Signed)
Pepcid prn.

## 2016-01-08 NOTE — Assessment & Plan Note (Signed)
Tramadol prn ° Potential benefits of a long term opioids use as well as potential risks (i.e. addiction risk, apnea etc) and complications (i.e. Somnolence, constipation and others) were explained to the patient and were aknowledged. ° ° °

## 2016-01-08 NOTE — Assessment & Plan Note (Signed)
On Losartan 

## 2016-01-24 ENCOUNTER — Telehealth: Payer: Self-pay | Admitting: *Deleted

## 2016-01-24 NOTE — Telephone Encounter (Signed)
Rec'd fax pt requesting refill on Zolpidem 10 mg. Last filled 12/11/15. Per chart MD ok script back on 7/25, Called [facility] to see if they ever received script spoke w/Brooke she stated they never received. Gave verbal authorization from MD response on 7/25.Marland KitchenJohny Chess

## 2016-01-28 ENCOUNTER — Ambulatory Visit (INDEPENDENT_AMBULATORY_CARE_PROVIDER_SITE_OTHER): Payer: 59 | Admitting: Pharmacist

## 2016-01-28 DIAGNOSIS — K921 Melena: Secondary | ICD-10-CM | POA: Diagnosis not present

## 2016-01-28 DIAGNOSIS — K644 Residual hemorrhoidal skin tags: Secondary | ICD-10-CM | POA: Diagnosis not present

## 2016-01-28 DIAGNOSIS — Z7901 Long term (current) use of anticoagulants: Secondary | ICD-10-CM | POA: Diagnosis not present

## 2016-01-28 DIAGNOSIS — D6859 Other primary thrombophilia: Secondary | ICD-10-CM

## 2016-01-28 DIAGNOSIS — Z86010 Personal history of colonic polyps: Secondary | ICD-10-CM | POA: Diagnosis not present

## 2016-01-28 DIAGNOSIS — R198 Other specified symptoms and signs involving the digestive system and abdomen: Secondary | ICD-10-CM | POA: Diagnosis not present

## 2016-01-28 LAB — POCT INR: INR: 2.9

## 2016-01-28 NOTE — Patient Instructions (Addendum)
Patient instructed to take medications as defined in the Anti-coagulation Track section of this encounter.  Patient instructed to take today's dose.  Patient verbalized understanding of these instructions.  Anti-Coagulation Progress Note  Tracy Peters is a 82 y.o. female who is currently on an anti-coagulation regimen.    RECENT RESULTS: Recent results are below, the most recent result is correlated with a dose of 52.5 mg. per week: Lab Results  Component Value Date   INR 2.90 01/28/2016   INR 2.00 12/03/2015   INR 1.70 10/08/2015   PROTIME 39.6 (H) 04/03/2015    ANTI-COAG DOSE: Anticoagulation Dose Instructions as of 01/28/2016      Sun Mon Tue Wed Thu Fri Sat   New Dose 7.5 mg 5 mg 7.5 mg 7.5 mg 7.5 mg 7.5 mg 7.5 mg       ANTICOAG SUMMARY: @ANTICOAGSUMMARY @  ANTICOAG TODAY: @ANTICOAGTODAY @  PATIENT INSTRUCTIONS: @PATINSTR @   FOLLOW-UP Return in about 8 weeks (around 03/24/2016) for Follow up INR at 1130h.  Jorene Guest, III Pharm.D., CACP

## 2016-01-29 NOTE — Progress Notes (Signed)
Reviewed Thanks DrG 

## 2016-02-21 DIAGNOSIS — Z85828 Personal history of other malignant neoplasm of skin: Secondary | ICD-10-CM | POA: Diagnosis not present

## 2016-02-21 DIAGNOSIS — L570 Actinic keratosis: Secondary | ICD-10-CM | POA: Diagnosis not present

## 2016-02-21 DIAGNOSIS — D225 Melanocytic nevi of trunk: Secondary | ICD-10-CM | POA: Diagnosis not present

## 2016-02-21 DIAGNOSIS — L814 Other melanin hyperpigmentation: Secondary | ICD-10-CM | POA: Diagnosis not present

## 2016-02-21 DIAGNOSIS — L821 Other seborrheic keratosis: Secondary | ICD-10-CM | POA: Diagnosis not present

## 2016-03-19 ENCOUNTER — Encounter: Payer: Self-pay | Admitting: Internal Medicine

## 2016-03-21 ENCOUNTER — Ambulatory Visit (INDEPENDENT_AMBULATORY_CARE_PROVIDER_SITE_OTHER): Payer: 59

## 2016-03-21 DIAGNOSIS — Z23 Encounter for immunization: Secondary | ICD-10-CM | POA: Diagnosis not present

## 2016-03-24 ENCOUNTER — Ambulatory Visit (INDEPENDENT_AMBULATORY_CARE_PROVIDER_SITE_OTHER): Payer: 59 | Admitting: Pharmacist

## 2016-03-24 DIAGNOSIS — Z7901 Long term (current) use of anticoagulants: Secondary | ICD-10-CM | POA: Diagnosis not present

## 2016-03-24 LAB — POCT INR: INR: 2.1

## 2016-03-24 NOTE — Patient Instructions (Signed)
Patient instructed to take medications as defined in the Anti-coagulation Track section of this encounter.  Patient instructed to take today's dose.  Patient instructed to always call anticoagulation clinic provider or go to the emergency room if she sustains any falls/injury to her head.  Patient verbalized understanding of these instructions.

## 2016-03-24 NOTE — Progress Notes (Signed)
Reviewed Orpha Bur

## 2016-03-24 NOTE — Progress Notes (Signed)
Anti-Coagulation Progress Note  Tracy Peters is a 81 y.o. female who is currently on an anti-coagulation regimen.    RECENT RESULTS: Recent results are below, the most recent result is correlated with a dose of 50 mg. per week: Lab Results  Component Value Date   INR 2.10 03/24/2016   INR 2.90 01/28/2016   INR 2.00 12/03/2015   PROTIME 39.6 (H) 04/03/2015    ANTI-COAG DOSE: Anticoagulation Dose Instructions as of 03/24/2016      Sun Mon Tue Wed Thu Fri Sat   New Dose 7.5 mg 7.5 mg 7.5 mg 7.5 mg 7.5 mg 7.5 mg 7.5 mg       ANTICOAG SUMMARY: Anticoagulation Episode Summary    Current INR goal:   2.0-3.0  TTR:   64.7 % (4.9 y)  Next INR check:   05/26/2016  INR from last check:   2.10 (03/24/2016)  Weekly max dose:     Target end date:     INR check location:     Preferred lab:     Send INR reminders to:        Comments:         Anticoagulation Care Providers    Provider Role Specialty Phone number   Annia Belt, MD Referring Oncology [phone]      ANTICOAG TODAY: Anticoagulation Summary  As of 03/24/2016   INR goal:   2.0-3.0  TTR:     Today's INR:   2.10  Next INR check:   05/26/2016  Target end date:         Anticoagulation Episode Summary    INR check location:      Preferred lab:      Send INR reminders to:      Comments:       Anticoagulation Care Providers    Provider Role Specialty Phone number   Annia Belt, MD Referring Oncology [phone]      PATIENT INSTRUCTIONS: There are no Patient Instructions on file for this visit.   FOLLOW-UP Return in 3 months (on 06/09/2016) for Follow up INR at 1130h.  Jorene Guest, III Pharm.D., CACP

## 2016-03-26 DIAGNOSIS — K644 Residual hemorrhoidal skin tags: Secondary | ICD-10-CM | POA: Diagnosis not present

## 2016-03-26 DIAGNOSIS — Z86010 Personal history of colonic polyps: Secondary | ICD-10-CM | POA: Diagnosis not present

## 2016-03-26 DIAGNOSIS — R198 Other specified symptoms and signs involving the digestive system and abdomen: Secondary | ICD-10-CM | POA: Diagnosis not present

## 2016-04-17 ENCOUNTER — Ambulatory Visit (INDEPENDENT_AMBULATORY_CARE_PROVIDER_SITE_OTHER): Payer: 59 | Admitting: Internal Medicine

## 2016-04-17 ENCOUNTER — Encounter: Payer: Self-pay | Admitting: Internal Medicine

## 2016-04-17 ENCOUNTER — Ambulatory Visit (INDEPENDENT_AMBULATORY_CARE_PROVIDER_SITE_OTHER)
Admission: RE | Admit: 2016-04-17 | Discharge: 2016-04-17 | Disposition: A | Discharge status: Home / Self Care | Payer: 59 | Source: Ambulatory Visit | Attending: Internal Medicine | Admitting: Internal Medicine

## 2016-04-17 VITALS — BP 148/92 | HR 75 | Temp 98.1°F | Resp 16 | Ht 65.0 in | Wt 129.0 lb

## 2016-04-17 DIAGNOSIS — R0789 Other chest pain: Secondary | ICD-10-CM

## 2016-04-17 DIAGNOSIS — I749 Embolism and thrombosis of unspecified artery: Secondary | ICD-10-CM

## 2016-04-17 NOTE — Patient Instructions (Signed)

## 2016-04-17 NOTE — Progress Notes (Signed)
Subjective:  Patient ID: Tracy Peters, female    DOB: 10-21-1933  Age: 82 y.o. MRN: [id number]  CC: Chest Pain   HPI Tracy Peters presents for evaluation of right-sided chest wall pain for about 2-3 weeks. She tells me that she was on a boat changing from the upper deck to the lower deck when she fell and struck the right side of her rib cage. She has not been evaluated or treated prior to today. She reports intermittent episodes of sharp pain that shoots around into her back. She is been getting adequate symptom relief with tramadol and Tylenol.  Outpatient Medications Prior to Visit  Medication Sig Dispense Refill  . acyclovir ointment (ZOVIRAX) 5 % Apply 1 application topically daily as needed (for outbreak). 15 g 2  . diphenoxylate-atropine (LOMOTIL) 2.5-0.025 MG tablet Take 1 tablet by mouth 2 (two) times daily as needed for diarrhea or loose stools. 30 tablet 2  . docusate sodium 100 MG CAPS Take 100 mg by mouth 2 (two) times daily. 60 capsule 0  . EPINEPHrine 0.3 mg/0.3 mL IJ SOAJ injection As dirrected 1 Device 3  . fexofenadine (ALLEGRA) 180 MG tablet Take 180 mg by mouth daily as needed for allergies.     . fish oil-omega-3 fatty acids 1000 MG capsule Take 2 g by mouth daily.     . fluticasone (FLONASE) 50 MCG/ACT nasal spray Place 2 sprays into both nostrils daily. 16 g 0  . loperamide (IMODIUM A-D) 2 MG tablet Take 2 mg by mouth 4 (four) times daily as needed for diarrhea or loose stools. Will take one by mouth 69min before meals or two as needed.    Marland Kitchen losartan (COZAAR) 100 MG tablet Take 1 tablet (100 mg total) by mouth daily. 90 tablet 3  . nitrofurantoin, macrocrystal-monohydrate, (MACROBID) 100 MG capsule Take 1 capsule (100 mg total) by mouth 2 (two) times daily. 20 capsule 0  . Probiotic Product (ALIGN) 4 MG CAPS Take by mouth daily.      . traMADol (ULTRAM) 50 MG tablet Take 1-2 tablets (50-100 mg total) by mouth every 12 (twelve) hours as needed for moderate pain. 90  tablet 5  . triamcinolone ointment (KENALOG) 0.5 % Apply topically 4 (four) times daily. 30 g 3  . valACYclovir (VALTREX) 1000 MG tablet Take 1 tablet (1,000 mg total) by mouth 2 (two) times daily. 60 tablet 11  . warfarin (COUMADIN) 5 MG tablet Take 7.5 mg on Tues, Thur, Fri, Sat, Sun. Take 10 mg on Mon and Wed. 60 tablet 11  . zolpidem (AMBIEN) 10 MG tablet Take 1.5 tablets (15 mg total) by mouth at bedtime as needed for sleep. Ok to repeat prn in 4 hrs 135 tablet 5   No facility-administered medications prior to visit.     ROS Review of Systems  Constitutional: Negative for appetite change, chills, diaphoresis, fatigue and fever.  HENT: Negative.  Negative for trouble swallowing.   Eyes: Negative.  Negative for visual disturbance.  Respiratory: Negative for cough, choking, chest tightness, shortness of breath and stridor.   Cardiovascular: Positive for chest pain. Negative for palpitations and leg swelling.  Gastrointestinal: Negative for abdominal pain, constipation and diarrhea.  Endocrine: Negative.   Genitourinary: Negative.  Negative for difficulty urinating, dysuria, frequency and hematuria.  Musculoskeletal: Negative for arthralgias, back pain, joint swelling, myalgias, neck pain and neck stiffness.  Skin: Negative.   Allergic/Immunologic: Negative.   Neurological: Negative.  Negative for dizziness, syncope, weakness, light-headedness, numbness and  headaches.  Hematological: Negative.  Negative for adenopathy. Does not bruise/bleed easily.  Psychiatric/Behavioral: Negative.     Objective:  BP (!) 148/92 (BP Location: Left Arm, Patient Position: Sitting, Cuff Size: Normal)   Pulse 75   Temp 98.1 F (36.7 C) (Oral)   Resp 16   Ht 5\' 5"  (1.651 m)   Wt 129 lb (58.5 kg)   SpO2 98%   BMI 21.47 kg/m   BP Readings from Last 3 Encounters:  04/17/16 (!) 148/92  01/08/16 (!) 180/90  07/31/15 (!) 147/73    Wt Readings from Last 3 Encounters:  04/17/16 129 lb (58.5 kg)    01/08/16 130 lb (59 kg)  07/31/15 135 lb (61.2 kg)    Physical Exam  Constitutional: She is oriented to person, place, and time.  Non-toxic appearance. She does not have a sickly appearance. She does not appear ill. No distress.  HENT:  Mouth/Throat: Oropharynx is clear and moist. No oropharyngeal exudate.  Eyes: Conjunctivae are normal. Right eye exhibits no discharge. Left eye exhibits no discharge. No scleral icterus.  Neck: Normal range of motion. Neck supple. No JVD present. No tracheal deviation present. No thyromegaly present.  Cardiovascular: S1 normal and S2 normal.  Exam reveals no gallop.   Murmur heard.  Systolic murmur is present with a grade of 2/6   No diastolic murmur is present  Pulmonary/Chest: Breath sounds normal. No accessory muscle usage or stridor. No tachypnea. No respiratory distress. She has no wheezes. She has no rales. She exhibits tenderness and bony tenderness. She exhibits no mass, no deformity, no swelling and no retraction.    Abdominal: Soft. Bowel sounds are normal. She exhibits no distension and no mass. There is no tenderness. There is no rebound and no guarding.  Musculoskeletal: Normal range of motion. She exhibits no edema, tenderness or deformity.       Cervical back: Normal. She exhibits normal range of motion, no tenderness and no bony tenderness.       Thoracic back: Normal. She exhibits normal range of motion, no tenderness and no bony tenderness.       Lumbar back: Normal. She exhibits normal range of motion, no tenderness and no bony tenderness.  Lymphadenopathy:    She has no cervical adenopathy.  Neurological: She is oriented to person, place, and time.  Skin: Skin is warm and dry. No rash noted. She is not diaphoretic. No erythema. No pallor.  Vitals reviewed.   Lab Results  Component Value Date   WBC 9.8 01/03/2016   HGB 12.3 01/03/2016   HCT 36.8 01/03/2016   PLT 307.0 01/03/2016   GLUCOSE 85 01/03/2016   CHOL 196 01/03/2016    TRIG 85.0 01/03/2016   HDL 63.10 01/03/2016   LDLDIRECT 129.8 05/31/2012   LDLCALC 116 (H) 01/03/2016   ALT 17 01/03/2016   AST 22 01/03/2016   NA 136 01/03/2016   K 4.6 01/03/2016   CL 100 01/03/2016   CREATININE 0.96 01/03/2016   BUN 14 01/03/2016   CO2 28 01/03/2016   TSH 2.69 01/03/2016   INR 2.10 03/24/2016    No results found.  Assessment & Plan:   Enajah was seen today for chest pain.  Diagnoses and all orders for this visit:  Right-sided chest wall pain- plain films are negative for any sequela such as rib fracture, pneumo/hemothorax, effusion, or pulmonary contusion. She will continue to take Tylenol and tramadol as needed for the pain. -     DG Chest 2 View;  Future   I am having Ms. Hornig maintain her ALIGN, loperamide, fish oil-omega-3 fatty acids, fexofenadine, DSS, acyclovir ointment, diphenoxylate-atropine, triamcinolone ointment, nitrofurantoin (macrocrystal-monohydrate), EPINEPHrine, losartan, traMADol, valACYclovir, warfarin, zolpidem, and fluticasone.  No orders of the defined types were placed in this encounter.    Follow-up: Return if symptoms worsen or fail to improve.  Scarlette Calico, MD

## 2016-04-17 NOTE — Progress Notes (Signed)
Pre visit review using our clinic review tool, if applicable. No additional management support is needed unless otherwise documented below in the visit note. 

## 2016-05-26 ENCOUNTER — Ambulatory Visit (INDEPENDENT_AMBULATORY_CARE_PROVIDER_SITE_OTHER): Payer: 59 | Admitting: Pharmacist

## 2016-05-26 DIAGNOSIS — I749 Embolism and thrombosis of unspecified artery: Secondary | ICD-10-CM

## 2016-05-26 DIAGNOSIS — Z7901 Long term (current) use of anticoagulants: Secondary | ICD-10-CM

## 2016-05-26 LAB — POCT INR: INR: 2.5

## 2016-05-26 NOTE — Patient Instructions (Signed)
Patient instructed to take medications as defined in the Anti-coagulation Track section of this encounter.  Patient instructed to take today's dose.  Patient instructed to take 1&1/2 tablets of your 5mg  peach colored warfarin tablets by mouth daily.  Patient verbalized understanding of these instructions.

## 2016-05-26 NOTE — Progress Notes (Signed)
Anti-Coagulation Progress Note  Tracy Peters is a 82 y.o. female who is currently on an anti-coagulation regimen.    RECENT RESULTS: Recent results are below, the most recent result is correlated with a dose of 52.5 mg. per week: Lab Results  Component Value Date   INR 2.50 05/26/2016   INR 2.10 03/24/2016   INR 2.90 01/28/2016   PROTIME 39.6 (H) 04/03/2015    ANTI-COAG DOSE: Anticoagulation Dose Instructions as of 05/26/2016      Dorene Grebe Tue Wed Thu Fri Sat   New Dose 7.5 mg 7.5 mg 7.5 mg 7.5 mg 7.5 mg 7.5 mg 7.5 mg       ANTICOAG SUMMARY: Anticoagulation Episode Summary    Current INR goal:   2.0-3.0  TTR:   65.9 % (5 y)  Next INR check:   08/04/2016  INR from last check:   2.50 (05/26/2016)  Weekly max dose:     Target end date:     INR check location:     Preferred lab:     Send INR reminders to:        Comments:         Anticoagulation Care Providers    Provider Role Specialty Phone number   Annia Belt, MD Referring Oncology [phone]      ANTICOAG TODAY: Anticoagulation Summary  As of 05/26/2016   INR goal:   2.0-3.0  TTR:     Today's INR:   2.50  Next INR check:   08/04/2016  Target end date:         Anticoagulation Episode Summary    INR check location:      Preferred lab:      Send INR reminders to:      Comments:       Anticoagulation Care Providers    Provider Role Specialty Phone number   Annia Belt, MD Referring Oncology [phone]      PATIENT INSTRUCTIONS: Patient instructed to take medications as defined in the Anti-coagulation Track section of this encounter.  Patient instructed to take today's dose.  Patient instructed to take 1&1/2 tablets of your 5mg  peach colored warfarin tablets by mouth daily.  Patient verbalized understanding of these instructions.     FOLLOW-UP Return in 2 months (on 08/04/2016) for Follow up INR at 1130h.  Jorene Guest, III Pharm.D., CACP

## 2016-06-26 DIAGNOSIS — M25551 Pain in right hip: Secondary | ICD-10-CM | POA: Diagnosis not present

## 2016-07-14 ENCOUNTER — Encounter: Payer: Self-pay | Admitting: Internal Medicine

## 2016-07-14 ENCOUNTER — Telehealth: Payer: Self-pay

## 2016-07-14 ENCOUNTER — Ambulatory Visit (INDEPENDENT_AMBULATORY_CARE_PROVIDER_SITE_OTHER): Payer: 59 | Admitting: Internal Medicine

## 2016-07-14 VITALS — BP 122/76 | HR 84 | Temp 98.4°F | Resp 20 | Wt 124.0 lb

## 2016-07-14 DIAGNOSIS — M5440 Lumbago with sciatica, unspecified side: Secondary | ICD-10-CM | POA: Diagnosis not present

## 2016-07-14 DIAGNOSIS — M5412 Radiculopathy, cervical region: Secondary | ICD-10-CM

## 2016-07-14 DIAGNOSIS — I10 Essential (primary) hypertension: Secondary | ICD-10-CM | POA: Diagnosis not present

## 2016-07-14 DIAGNOSIS — E785 Hyperlipidemia, unspecified: Secondary | ICD-10-CM

## 2016-07-14 DIAGNOSIS — Z7901 Long term (current) use of anticoagulants: Secondary | ICD-10-CM

## 2016-07-14 DIAGNOSIS — G8929 Other chronic pain: Secondary | ICD-10-CM

## 2016-07-14 DIAGNOSIS — G4700 Insomnia, unspecified: Secondary | ICD-10-CM

## 2016-07-14 DIAGNOSIS — R42 Dizziness and giddiness: Secondary | ICD-10-CM | POA: Diagnosis not present

## 2016-07-14 DIAGNOSIS — Z0000 Encounter for general adult medical examination without abnormal findings: Secondary | ICD-10-CM

## 2016-07-14 DIAGNOSIS — M8580 Other specified disorders of bone density and structure, unspecified site: Secondary | ICD-10-CM | POA: Insufficient documentation

## 2016-07-14 MED ORDER — TRAMADOL HCL 50 MG PO TABS: 50.0000 mg | ORAL_TABLET | Freq: Two times a day (BID) | ORAL | 5 refills | Status: DC | PRN

## 2016-07-14 MED ORDER — FLUTICASONE PROPIONATE 50 MCG/ACT NA SUSP: 2.0000 | Freq: Every day | NASAL | 0 refills | Status: DC

## 2016-07-14 MED ORDER — VITAMIN D3 1.25 MG (50000 UT) PO CAPS: 1.0000 | ORAL_CAPSULE | ORAL | 15 refills | Status: DC

## 2016-07-14 MED ORDER — VALACYCLOVIR HCL 1 G PO TABS: 1000.0000 mg | ORAL_TABLET | Freq: Two times a day (BID) | ORAL | 11 refills | Status: DC

## 2016-07-14 MED ORDER — DIPHENOXYLATE-ATROPINE 2.5-0.025 MG PO TABS: 1.0000 | ORAL_TABLET | Freq: Two times a day (BID) | ORAL | 2 refills | Status: DC | PRN

## 2016-07-14 MED ORDER — ZOLPIDEM TARTRATE 10 MG PO TABS: 15.0000 mg | ORAL_TABLET | Freq: Every evening | ORAL | 5 refills | Status: DC | PRN

## 2016-07-14 MED ORDER — LOSARTAN POTASSIUM 100 MG PO TABS
100.0000 mg | ORAL_TABLET | Freq: Every day | ORAL | 3 refills | Status: DC
Start: 2016-07-14 — End: 2017-01-27

## 2016-07-14 MED ORDER — WARFARIN SODIUM 5 MG PO TABS: ORAL_TABLET | ORAL | 11 refills | Status: DC

## 2016-07-14 NOTE — Assessment & Plan Note (Signed)

## 2016-07-14 NOTE — Progress Notes (Signed)
Pre visit review using our clinic review tool, if applicable. No additional management support is needed unless otherwise documented below in the visit note. 

## 2016-07-14 NOTE — Telephone Encounter (Signed)
Per dr plotnikov----ok to refill warfarin with 11 refills---rx has been printed, signed and handed to patient in person

## 2016-07-14 NOTE — Assessment & Plan Note (Signed)
On Coumadin 

## 2016-07-14 NOTE — Progress Notes (Signed)
Subjective:  Patient ID: Tracy Peters, female    DOB: [birth year]  Age: 83 y.o. MRN: [id number]  CC: No chief complaint on file.   HPI Tracy Peters presents for a well exam F/u R hip pain, LBP and insomnia Vit D aggravated IBS - stopped Has 2 yorkies at home  Outpatient Medications Prior to Visit  Medication Sig Dispense Refill  . acyclovir ointment (ZOVIRAX) 5 % Apply 1 application topically daily as needed (for outbreak). 15 g 2  . docusate sodium 100 MG CAPS Take 100 mg by mouth 2 (two) times daily. 60 capsule 0  . EPINEPHrine 0.3 mg/0.3 mL IJ SOAJ injection As dirrected 1 Device 3  . fexofenadine (ALLEGRA) 180 MG tablet Take 180 mg by mouth daily as needed for allergies.     . fish oil-omega-3 fatty acids 1000 MG capsule Take 2 g by mouth daily.     Marland Kitchen loperamide (IMODIUM A-D) 2 MG tablet Take 2 mg by mouth 4 (four) times daily as needed for diarrhea or loose stools. Will take one by mouth 14min before meals or two as needed.    . nitrofurantoin, macrocrystal-monohydrate, (MACROBID) 100 MG capsule Take 1 capsule (100 mg total) by mouth 2 (two) times daily. 20 capsule 0  . Probiotic Product (ALIGN) 4 MG CAPS Take by mouth daily.      Marland Kitchen triamcinolone ointment (KENALOG) 0.5 % Apply topically 4 (four) times daily. 30 g 3  . warfarin (COUMADIN) 5 MG tablet Take 7.5 mg on Tues, Thur, Fri, Sat, Sun. Take 10 mg on Mon and Wed. 60 tablet 11  . diphenoxylate-atropine (LOMOTIL) 2.5-0.025 MG tablet Take 1 tablet by mouth 2 (two) times daily as needed for diarrhea or loose stools. 30 tablet 2  . fluticasone (FLONASE) 50 MCG/ACT nasal spray Place 2 sprays into both nostrils daily. 16 g 0  . losartan (COZAAR) 100 MG tablet Take 1 tablet (100 mg total) by mouth daily. 90 tablet 3  . traMADol (ULTRAM) 50 MG tablet Take 1-2 tablets (50-100 mg total) by mouth every 12 (twelve) hours as needed for moderate pain. 90 tablet 5  . valACYclovir (VALTREX) 1000 MG tablet Take 1 tablet (1,000 mg total) by  mouth 2 (two) times daily. 60 tablet 11  . zolpidem (AMBIEN) 10 MG tablet Take 1.5 tablets (15 mg total) by mouth at bedtime as needed for sleep. Ok to repeat prn in 4 hrs 135 tablet 5   No facility-administered medications prior to visit.     ROS Review of Systems  Constitutional: Positive for fatigue. Negative for activity change, appetite change, chills and unexpected weight change.  HENT: Negative for congestion, mouth sores and sinus pressure.   Eyes: Negative for visual disturbance.  Respiratory: Negative for cough and chest tightness.   Cardiovascular: Negative for palpitations.  Gastrointestinal: Negative for abdominal pain and nausea.  Genitourinary: Negative for difficulty urinating, frequency and vaginal pain.  Musculoskeletal: Positive for arthralgias and back pain. Negative for gait problem.  Skin: Negative for pallor and rash.  Neurological: Negative for dizziness, tremors, weakness, numbness and headaches.  Psychiatric/Behavioral: Positive for sleep disturbance. Negative for confusion and suicidal ideas. The patient is nervous/anxious.     Objective:  BP 122/76   Pulse 84   Temp 98.4 F (36.9 C) (Oral)   Resp 20   Wt 124 lb (56.2 kg)   SpO2 96%   BMI 20.63 kg/m   BP Readings from Last 3 Encounters:  07/14/16 122/76  04/17/16 Marland Kitchen)  148/92  01/08/16 (!) 180/90    Wt Readings from Last 3 Encounters:  07/14/16 124 lb (56.2 kg)  04/17/16 129 lb (58.5 kg)  01/08/16 130 lb (59 kg)    Physical Exam  Constitutional: She appears well-developed. No distress.  HENT:  Head: Normocephalic.  Right Ear: External ear normal.  Left Ear: External ear normal.  Nose: Nose normal.  Mouth/Throat: Oropharynx is clear and moist.  Eyes: Conjunctivae are normal. Pupils are equal, round, and reactive to light. Right eye exhibits no discharge. Left eye exhibits no discharge.  Neck: Normal range of motion. Neck supple. No JVD present. No tracheal deviation present. No thyromegaly  present.  Cardiovascular: Normal rate, regular rhythm and normal heart sounds.   Pulmonary/Chest: No stridor. No respiratory distress. She has no wheezes.  Abdominal: Soft. Bowel sounds are normal. She exhibits no distension and no mass. There is no tenderness. There is no rebound and no guarding.  Musculoskeletal: She exhibits tenderness. She exhibits no edema.  Lymphadenopathy:    She has no cervical adenopathy.  Neurological: She displays normal reflexes. No cranial nerve deficit. She exhibits normal muscle tone. Coordination normal.  Skin: No rash noted. No erythema.  Psychiatric: She has a normal mood and affect. Her behavior is normal. Judgment and thought content normal.    Lab Results  Component Value Date   WBC 9.8 01/03/2016   HGB 12.3 01/03/2016   HCT 36.8 01/03/2016   PLT 307.0 01/03/2016   GLUCOSE 85 01/03/2016   CHOL 196 01/03/2016   TRIG 85.0 01/03/2016   HDL 63.10 01/03/2016   LDLDIRECT 129.8 05/31/2012   LDLCALC 116 (H) 01/03/2016   ALT 17 01/03/2016   AST 22 01/03/2016   NA 136 01/03/2016   K 4.6 01/03/2016   CL 100 01/03/2016   CREATININE 0.96 01/03/2016   BUN 14 01/03/2016   CO2 28 01/03/2016   TSH 2.69 01/03/2016   INR 2.50 05/26/2016    Dg Chest 2 View  Result Date: 04/17/2016 CLINICAL DATA:  Right-sided chest wall pain. EXAM: CHEST  2 VIEW COMPARISON:  01/11/2014 FINDINGS: Lungs are clear without focal airspace disease or pulmonary edema. Heart and mediastinum are within normal limits. Atherosclerotic calcifications at the aortic arch. Surgical plate in the lower cervical spine. No pleural effusions. IMPRESSION: No active cardiopulmonary disease. Aortic atherosclerosis. Electronically Signed   By: Markus Daft M.D.   On: 04/17/2016 09:16    Assessment & Plan:   There are no diagnoses linked to this encounter. I am having Tracy Peters maintain her ALIGN, loperamide, fish oil-omega-3 fatty acids, fexofenadine, DSS, acyclovir ointment, triamcinolone  ointment, nitrofurantoin (macrocrystal-monohydrate), EPINEPHrine, and warfarin.  No orders of the defined types were placed in this encounter.    Follow-up: No Follow-up on file.  [name], MD

## 2016-07-14 NOTE — Patient Instructions (Signed)

## 2016-07-14 NOTE — Assessment & Plan Note (Signed)
Wt bearing exercise Re-start Vit D 50000 q14 d

## 2016-07-14 NOTE — Assessment & Plan Note (Signed)
Tramadol prn Re-start Vit D 50000 q14 d

## 2016-07-14 NOTE — Assessment & Plan Note (Signed)
Zolpidem prn  Potential benefits of a long term benzodiazepines  use as well as potential risks  and complications were explained to the patient and were aknowledged. 

## 2016-07-14 NOTE — Assessment & Plan Note (Signed)
Losartan 

## 2016-07-14 NOTE — Telephone Encounter (Signed)
Medications printed

## 2016-07-24 ENCOUNTER — Other Ambulatory Visit: Payer: Self-pay | Admitting: Internal Medicine

## 2016-07-31 DIAGNOSIS — M25551 Pain in right hip: Secondary | ICD-10-CM | POA: Diagnosis not present

## 2016-07-31 DIAGNOSIS — M1611 Unilateral primary osteoarthritis, right hip: Secondary | ICD-10-CM | POA: Diagnosis not present

## 2016-07-31 DIAGNOSIS — S7001XD Contusion of right hip, subsequent encounter: Secondary | ICD-10-CM | POA: Diagnosis not present

## 2016-07-31 NOTE — Telephone Encounter (Signed)
Called refill into [facility] spoke w/Renee gave MD approval.../lmb

## 2016-08-04 ENCOUNTER — Ambulatory Visit (INDEPENDENT_AMBULATORY_CARE_PROVIDER_SITE_OTHER): Payer: 59 | Admitting: Pharmacist

## 2016-08-04 DIAGNOSIS — Z7901 Long term (current) use of anticoagulants: Secondary | ICD-10-CM

## 2016-08-04 LAB — POCT INR: INR: 2

## 2016-08-04 MED ORDER — WARFARIN SODIUM 5 MG PO TABS: ORAL_TABLET | ORAL | 11 refills | Status: DC

## 2016-08-04 NOTE — Progress Notes (Signed)
Reviewed Thanks DrG 

## 2016-08-04 NOTE — Progress Notes (Signed)
Anti-Coagulation Progress Note  VERVA KROTZER is a 83 y.o. female who is currently on an anti-coagulation regimen.    RECENT RESULTS: Recent results are below, the most recent result is correlated with a dose of 52.5 mg. per week: Lab Results  Component Value Date   INR 2.0 08/04/2016   INR 2.50 05/26/2016   INR 2.10 03/24/2016   PROTIME 39.6 (H) 04/03/2015    ANTI-COAG DOSE: Anticoagulation Dose Instructions as of 08/04/2016      Sun Mon Tue Wed Thu Fri Sat   New Dose 7.5 mg 10 mg 7.5 mg 7.5 mg 7.5 mg 7.5 mg 7.5 mg       ANTICOAG SUMMARY: Anticoagulation Episode Summary    Current INR goal:   2.0-3.0  TTR:   67.1 % (5.2 y)  Next INR check:   09/29/2016  INR from last check:   2.0 (08/04/2016)  Weekly max dose:     Target end date:     INR check location:     Preferred lab:     Send INR reminders to:        Comments:         Anticoagulation Care Providers    Provider Role Specialty Phone number   Annia Belt, MD Referring Oncology [phone]      ANTICOAG TODAY: Anticoagulation Summary  As of 08/04/2016   INR goal:   2.0-3.0  TTR:     Today's INR:   2.0  Next INR check:   09/29/2016  Target end date:         Anticoagulation Episode Summary    INR check location:      Preferred lab:      Send INR reminders to:      Comments:       Anticoagulation Care Providers    Provider Role Specialty Phone number   Annia Belt, MD Referring Oncology [phone]      PATIENT INSTRUCTIONS: Patient instructed to take medications as defined in the Anti-coagulation Track section of this encounter.  Patient instructed to take today's dose.  Patient instructed to take 2 tablets of your peach colored 5mg  strength warfarin tablets by mouth once-daily at [facility] Monday; ALL OTHER DAYS--take 1&1/2 tablets.  Patient verbalized understanding of these instructions.      FOLLOW-UP Return in 8 weeks (on 09/29/2016) for Follow up INR at 1130h.  Jorene Guest,  III Pharm.D., CACP

## 2016-08-04 NOTE — Patient Instructions (Signed)
Patient instructed to take medications as defined in the Anti-coagulation Track section of this encounter.  Patient instructed to take today's dose.  Patient instructed to take 2 tablets of your peach colored 5mg  strength warfarin tablets by mouth once-daily at [facility] Monday; ALL OTHER DAYS--take 1&1/2 tablets.  Patient verbalized understanding of these instructions.

## 2016-08-04 NOTE — Addendum Note (Signed)
Addended by: Jorene Guest B on: 08/04/2016 11:48 AM   Modules accepted: Orders

## 2016-08-13 DIAGNOSIS — M25551 Pain in right hip: Secondary | ICD-10-CM | POA: Diagnosis not present

## 2016-08-13 DIAGNOSIS — S7001XD Contusion of right hip, subsequent encounter: Secondary | ICD-10-CM | POA: Diagnosis not present

## 2016-08-13 DIAGNOSIS — M1611 Unilateral primary osteoarthritis, right hip: Secondary | ICD-10-CM | POA: Diagnosis not present

## 2016-08-18 ENCOUNTER — Ambulatory Visit (INDEPENDENT_AMBULATORY_CARE_PROVIDER_SITE_OTHER): Payer: 59 | Admitting: Pharmacist

## 2016-08-18 ENCOUNTER — Ambulatory Visit (INDEPENDENT_AMBULATORY_CARE_PROVIDER_SITE_OTHER): Payer: 59 | Admitting: Oncology

## 2016-08-18 ENCOUNTER — Encounter: Payer: Self-pay | Admitting: Oncology

## 2016-08-18 VITALS — BP 174/87 | HR 88 | Temp 97.5°F | Ht 65.0 in | Wt 124.7 lb

## 2016-08-18 DIAGNOSIS — Z881 Allergy status to other antibiotic agents status: Secondary | ICD-10-CM

## 2016-08-18 DIAGNOSIS — M4802 Spinal stenosis, cervical region: Secondary | ICD-10-CM | POA: Diagnosis not present

## 2016-08-18 DIAGNOSIS — Z981 Arthrodesis status: Secondary | ICD-10-CM | POA: Diagnosis not present

## 2016-08-18 DIAGNOSIS — M4722 Other spondylosis with radiculopathy, cervical region: Secondary | ICD-10-CM | POA: Diagnosis not present

## 2016-08-18 DIAGNOSIS — Z86718 Personal history of other venous thrombosis and embolism: Secondary | ICD-10-CM

## 2016-08-18 DIAGNOSIS — Z7901 Long term (current) use of anticoagulants: Secondary | ICD-10-CM | POA: Diagnosis not present

## 2016-08-18 DIAGNOSIS — G5601 Carpal tunnel syndrome, right upper limb: Secondary | ICD-10-CM

## 2016-08-18 DIAGNOSIS — Z91013 Allergy to seafood: Secondary | ICD-10-CM

## 2016-08-18 DIAGNOSIS — I742 Embolism and thrombosis of arteries of the upper extremities: Secondary | ICD-10-CM

## 2016-08-18 DIAGNOSIS — Z85828 Personal history of other malignant neoplasm of skin: Secondary | ICD-10-CM | POA: Diagnosis not present

## 2016-08-18 DIAGNOSIS — D539 Nutritional anemia, unspecified: Secondary | ICD-10-CM

## 2016-08-18 DIAGNOSIS — Z888 Allergy status to other drugs, medicaments and biological substances status: Secondary | ICD-10-CM

## 2016-08-18 DIAGNOSIS — Z9181 History of falling: Secondary | ICD-10-CM

## 2016-08-18 DIAGNOSIS — I10 Essential (primary) hypertension: Secondary | ICD-10-CM | POA: Diagnosis not present

## 2016-08-18 DIAGNOSIS — Z880 Allergy status to penicillin: Secondary | ICD-10-CM

## 2016-08-18 DIAGNOSIS — D688 Other specified coagulation defects: Secondary | ICD-10-CM

## 2016-08-18 DIAGNOSIS — Z885 Allergy status to narcotic agent status: Secondary | ICD-10-CM

## 2016-08-18 DIAGNOSIS — Z886 Allergy status to analgesic agent status: Secondary | ICD-10-CM

## 2016-08-18 DIAGNOSIS — Z882 Allergy status to sulfonamides status: Secondary | ICD-10-CM

## 2016-08-18 DIAGNOSIS — R51 Headache: Secondary | ICD-10-CM

## 2016-08-18 DIAGNOSIS — Z8742 Personal history of other diseases of the female genital tract: Secondary | ICD-10-CM | POA: Diagnosis not present

## 2016-08-18 DIAGNOSIS — Z91041 Radiographic dye allergy status: Secondary | ICD-10-CM

## 2016-08-18 LAB — POCT INR: INR: 1.9

## 2016-08-18 NOTE — Progress Notes (Signed)
Hematology and Oncology Follow Up Visit  MERRISA NEASE [id number] [birth year] 83 y.o. 08/18/2016 4:27 PM   Principle Diagnosis: Encounter Diagnoses  Name Primary?  . DVT, HX OF Yes  . Chronic anticoagulation   . Thrombosis of right radial artery [facility])   Clinical summary: 83 year old woman I have followed for many years. She sustained an arterial embolus to an index finger of her right hand involving a right radial artery in 1999 which happened coincidentally with starting a Cox 2 inhibitor. She has been maintained on chronic Coumadin anticoagulation since that time and has had no subsequent thrombotic events.  She has known degenerative arthritis of the spine and has had 2 operations in the past. She tells me that her left knee is deteriorating and surgery also recommended on this in the near future.  She developed a cervical radiculopathysecondary to cervical stenosis and spondylolysis. She had successful surgery on her cervical spine with anterior cervical decompression and discectomy and a fusion at 3 levels by Dr. Newman Pies on January 18, 2014.  She had residual paresthesias in some of her fingers of the right hand. She was reevaluated by Dr. Arnoldo Morale and found to have carpal tunnel syndrome. She underwent a another surgical procedure on 06/12/2014. She has had significant improvement of the paresthesias now limited just to the middle finger.   Interim History:   She lost her footing last week in her bathroom and fell down.  She struck the back of her head.  She "saw stars" but did not lose consciousness.  Her husband was not at home.  When he did arrive and wanted to take her to the emergency department she refused.  She has a persistent occipital headache but has not developed any confusion or lethargy. Most recent INR was 2.0 on February 19 and her Coumadin dose was slightly increased.  She fell on February 26.  Repeat coag checked today.  Results pending. Her husband just left town  for 7 day business trip.  Medications: reviewed  Allergies:  Allergies  Allergen Reactions  . Bee Venom Anaphylaxis    Yellow jackets, wasps as well  . Ivp Dye [Iodinated Diagnostic Agents] Anaphylaxis  . Shellfish Allergy Anaphylaxis  . Amlodipine Besylate     REACTION: swelling  . Aspirin     REACTION: unspecified  . Atorvastatin     REACTION: diarrhea  . Cholestyramine     REACTION: mouth irritation  . Codeine Phosphate Nausea And Vomiting  . Demerol [Meperidine]     Severe Hallucination!!!!  . Diphenhydramine Hcl Other (See Comments)    hyperactive  . Doxycycline     REACTION: nausea  . Gentamicin Sulfate     REACTION: unspecified  . Meclizine Hcl     REACTION: more dizziness on it  . Meperidine Hcl   . Metronidazole     REACTION: bad taste  . Moxifloxacin     REACTION: insomnia Able to use Cipro  . Sulfamethoxazole     Kidney problems  . Penicillins Swelling and Rash    Review of Systems: See interim history Remaining ROS negative:   Physical Exam: Blood pressure (!) 174/87, pulse 88, temperature 97.5 F (36.4 C), temperature source Oral, height 5\' 5"  (1.651 m), weight 124 lb 11.2 oz (56.6 kg), SpO2 100 %. Wt Readings from Last 3 Encounters:  08/18/16 124 lb 11.2 oz (56.6 kg)  07/14/16 124 lb (56.2 kg)  04/17/16 129 lb (58.5 kg)     General appearance: Thin Caucasian woman HENNT:  No obvious laceration on the scalp , pharynx no erythema, exudate, mass, or ulcer. No thyromegaly or thyroid nodules Lymph nodes: No cervical, supraclavicular, or axillary lymphadenopathy Breasts: Lungs: Clear to auscultation, resonant to percussion throughout Heart: Regular rhythm, no murmur, no gallop, no rub, no click, no edema Abdomen: Soft, nontender, normal bowel sounds, no mass, no organomegaly Extremities: No edema, no calf tenderness Musculoskeletal: no joint deformities GU:  Vascular: Carotid pulses 2+, no bruits,  Neurologic: Alert, oriented x3,  PERRLA, optic  discs sharp and vessels normal, no hemorrhage or exudate, full extraocular movements, cranial nerves grossly normal, motor strength 5 over 5, reflexes 1+ symmetric, upper body coordination normal, gait normal, Skin: No rash or ecchymosis  Lab Results: CBC W/Diff    Component Value Date/Time   WBC 9.8 01/03/2016 1043   RBC 3.35 (L) 01/03/2016 1043   HGB 12.3 01/03/2016 1043   HGB 12.7 03/21/2014 1058   HCT 36.8 01/03/2016 1043   HCT 36.4 07/31/2015 1213   HCT 38.7 03/21/2014 1058   PLT 307.0 01/03/2016 1043   PLT 328 07/31/2015 1213   MCV 109.9 Repeated and verified X2. (H) 01/03/2016 1043   MCV 109 (H) 07/31/2015 1213   MCV 110.4 (H) 03/21/2014 1058   MCH 37.3 (H) 07/31/2015 1213   MCH 36.3 (H) 08/01/2014 1119   MCHC 33.4 01/03/2016 1043   RDW 14.3 01/03/2016 1043   RDW 13.6 07/31/2015 1213   RDW 13.3 03/21/2014 1058   LYMPHSABS 2.1 01/03/2016 1043   LYMPHSABS 1.9 07/31/2015 1213   LYMPHSABS 1.6 03/21/2014 1058   MONOABS 0.8 01/03/2016 1043   MONOABS 0.6 03/21/2014 1058   EOSABS 0.1 01/03/2016 1043   EOSABS 0.0 07/31/2015 1213   BASOSABS 0.0 01/03/2016 1043   BASOSABS 0.0 07/31/2015 1213   BASOSABS 0.0 03/21/2014 1058     Chemistry      Component Value Date/Time   NA 136 01/03/2016 1043   NA 132 (L) 07/31/2015 1213   NA 132 (L) 03/21/2014 1100   K 4.6 01/03/2016 1043   K 4.9 03/21/2014 1100   CL 100 01/03/2016 1043   CL 97 (L) 03/16/2012 1344   CO2 28 01/03/2016 1043   CO2 26 03/21/2014 1100   BUN 14 01/03/2016 1043   BUN 13 07/31/2015 1213   BUN 16.0 03/21/2014 1100   CREATININE 0.96 01/03/2016 1043   CREATININE 0.9 03/21/2014 1100      Component Value Date/Time   CALCIUM 9.7 01/03/2016 1043   CALCIUM 9.9 03/21/2014 1100   ALKPHOS 47 01/03/2016 1043   ALKPHOS 51 03/21/2014 1100   AST 22 01/03/2016 1043   AST 22 03/21/2014 1100   ALT 17 01/03/2016 1043   ALT 15 03/21/2014 1100   BILITOT 0.5 01/03/2016 1043   BILITOT 0.5 07/31/2015 1213   BILITOT 0.36  03/21/2014 1100       Radiological Studies: No results found.  Impression:  #1. 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 No subsequent thrombotic events on long-term Coumadin anticoagulation. Plan continue the same. As noted above, she fell last week and struck her head.  Fortunately she has not suffered any obvious neurologic damage.  Neurologic exam today is normal.  She is alerted to call immediately if she develops any confusion or lethargy.  #2. Degenerative arthritis of the spine status post cervical discectomy with fusion procedure and plate stabilization C4 through C7 August 2015  #3. Carpal tunnel syndrome status post right carpal tunnel repair in December  2015  #4. Chronic mild macrocytic anemia with normal 123456 and folic acid levels  #5. Essential hypertension Blood pressure which is usually well controlled was elevated today.  We will need to call her back in for a repeat blood pressure check this week in view of the recent fall.  #6. Irritable bowel syndrome  #7. History of genital herpes  #8. 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 Initial biopsy 02/27/2015.   CC: Patient Care Team: Cassandria Anger, MD as PCP - General Justice Britain, MD (Orthopedic Surgery) Annia Belt, MD (Hematology and Oncology) Molli Posey, MD (Obstetrics and Gynecology) Lafayette Dragon, MD (Inactive) (Gastroenterology) Newman Pies, MD as Consulting Physician (Neurosurgery) Gaynelle Arabian, MD as Consulting Physician (Orthopedic Surgery) Newman Pies, MD as Consulting Physician (Neurosurgery)   Annia Belt, MD 3/5/20184:27 PM

## 2016-08-18 NOTE — Progress Notes (Signed)
Anti-Coagulation Progress Note  Tracy Peters is a 83 y.o. female who is currently on an anti-coagulation regimen.    RECENT RESULTS: Recent results are below, the most recent result is correlated with a dose of 55 mg. per week: Lab Results  Component Value Date   INR 1.90 08/18/2016   INR 2.0 08/04/2016   INR 2.50 05/26/2016   PROTIME 39.6 (H) 04/03/2015    ANTI-COAG DOSE: Anticoagulation Dose Instructions as of 08/18/2016      Sun Mon Tue Wed Thu Fri Sat   New Dose 7.5 mg 10 mg 7.5 mg 10 mg 7.5 mg 10 mg 7.5 mg    Description   Take 2 tablets on Mondays, Wednesdays and Fridays; all other days--take 1 & 1/2 tablets.       ANTICOAG SUMMARY: Anticoagulation Episode Summary    Current INR goal:   2.0-3.0  TTR:   66.6 % (5.3 y)  Next INR check:   09/29/2016  INR from last check:   1.90! (08/18/2016)  Weekly max dose:     Target end date:     INR check location:     Preferred lab:     Send INR reminders to:        Comments:         Anticoagulation Care Providers    Provider Role Specialty Phone number   Annia Belt, MD Referring Oncology [phone]      ANTICOAG TODAY: Anticoagulation Summary  As of 08/18/2016   INR goal:   2.0-3.0  TTR:     Today's INR:   1.90!  Next INR check:   09/29/2016  Target end date:         Anticoagulation Episode Summary    INR check location:      Preferred lab:      Send INR reminders to:      Comments:       Anticoagulation Care Providers    Provider Role Specialty Phone number   Annia Belt, MD Referring Oncology [phone]      PATIENT INSTRUCTIONS: Patient Instructions  Patient instructed to take medications as defined in the Anti-coagulation Track section of this encounter.  Patient instructed to take today's dose.  Patient instructed to take 2 tablets of your 5mg  peach colored warfarin  Tablets by mouth once-daily at 6PM--EXCEPT on Sundays, Tuesdays, Thursdays and Saturdays--take ONLY 1 & 1/2 tablets on  these days.  Patient verbalized understanding of these instructions.       FOLLOW-UP Return in 6 weeks (on 09/29/2016) for Follow up INR at 1130h.  Jorene Guest, III Pharm.D., CACP

## 2016-08-18 NOTE — Progress Notes (Signed)
Reviewed Thanks DrG 

## 2016-08-18 NOTE — Patient Instructions (Signed)
To lab today Return visit 6 months 

## 2016-08-18 NOTE — Patient Instructions (Signed)
Patient instructed to take medications as defined in the Anti-coagulation Track section of this encounter.  Patient instructed to take today's dose.  Patient instructed to take 2 tablets of your 5mg  peach colored warfarin  Tablets by mouth once-daily at 6PM--EXCEPT on Sundays, Tuesdays, Thursdays and Saturdays--take ONLY 1 & 1/2 tablets on these days.  Patient verbalized understanding of these instructions.

## 2016-08-19 LAB — CBC WITH DIFFERENTIAL/PLATELET
BASOS ABS: 0 10*3/uL (ref 0.0–0.2)
BASOS: 0 %
EOS (ABSOLUTE): 0.1 10*3/uL (ref 0.0–0.4)
EOS: 1 %
HEMATOCRIT: 36.9 % (ref 34.0–46.6)
HEMOGLOBIN: 12.6 g/dL (ref 11.1–15.9)
IMMATURE GRANS (ABS): 0 10*3/uL (ref 0.0–0.1)
Immature Granulocytes: 0 %
LYMPHS: 32 %
Lymphocytes Absolute: 2.5 10*3/uL (ref 0.7–3.1)
MCH: 36.7 pg — AB (ref 26.6–33.0)
MCHC: 34.1 g/dL (ref 31.5–35.7)
MCV: 108 fL — AB (ref 79–97)
MONOCYTES: 10 %
Monocytes Absolute: 0.8 10*3/uL (ref 0.1–0.9)
NEUTROS ABS: 4.4 10*3/uL (ref 1.4–7.0)
Neutrophils: 57 %
Platelets: 375 10*3/uL (ref 150–379)
RBC: 3.43 x10E6/uL — ABNORMAL LOW (ref 3.77–5.28)
RDW: 14.7 % (ref 12.3–15.4)
WBC: 7.8 10*3/uL (ref 3.4–10.8)

## 2016-08-19 LAB — COMPREHENSIVE METABOLIC PANEL
ALK PHOS: 57 IU/L (ref 39–117)
ALT: 14 IU/L (ref 0–32)
AST: 17 IU/L (ref 0–40)
Albumin/Globulin Ratio: 1.4 (ref 1.2–2.2)
Albumin: 4.3 g/dL (ref 3.5–4.7)
BILIRUBIN TOTAL: 0.5 mg/dL (ref 0.0–1.2)
BUN/Creatinine Ratio: 24 (ref 12–28)
BUN: 21 mg/dL (ref 8–27)
CHLORIDE: 97 mmol/L (ref 96–106)
CO2: 18 mmol/L (ref 18–29)
Calcium: 9.8 mg/dL (ref 8.7–10.3)
Creatinine, Ser: 0.86 mg/dL (ref 0.57–1.00)
GFR calc Af Amer: 74 mL/min/{1.73_m2} (ref >59)
GFR calc non Af Amer: 64 mL/min/{1.73_m2} (ref >59)
GLOBULIN, TOTAL: 3 g/dL (ref 1.5–4.5)
Glucose: 96 mg/dL (ref 65–99)
POTASSIUM: 4.5 mmol/L (ref 3.5–5.2)
SODIUM: 136 mmol/L (ref 134–144)
Total Protein: 7.3 g/dL (ref 6.0–8.5)

## 2016-08-21 ENCOUNTER — Telehealth: Payer: Self-pay | Admitting: *Deleted

## 2016-08-21 ENCOUNTER — Encounter: Payer: Self-pay | Admitting: Oncology

## 2016-08-21 ENCOUNTER — Other Ambulatory Visit: Payer: Self-pay | Admitting: Oncology

## 2016-08-21 DIAGNOSIS — I10 Essential (primary) hypertension: Secondary | ICD-10-CM

## 2016-08-21 MED ORDER — HYDROCHLOROTHIAZIDE 12.5 MG PO CAPS: 12.5000 mg | ORAL_CAPSULE | Freq: Every day | ORAL | 3 refills | Status: DC

## 2016-08-21 NOTE — Progress Notes (Signed)
Blood pressure remains elevated.  No neuro signs or symptoms.  I am going to add hydrochlorothiazide 12.5 mg daily.  She is currently only on single agent losartan 100 mg daily. Further follow-up of her blood pressure per her primary care physician.

## 2016-08-21 NOTE — Progress Notes (Unsigned)
Pt returns to clinic for repeat blood pressure check.  Today pt's bp was 181/81 on arrival with pulse 76.   Previous blood pressure recorded as 174/87 on 08/18/16.   Rx for HCTZ 12.5mg  sent to pt's pharmacy [facility]).  Pt instructed to take one daily and follow up with pcp.  Will also send info to pcp.Despina Hidden Cassady3/8/20183:04 PM

## 2016-08-21 NOTE — Telephone Encounter (Signed)
-----   Message from Annia Belt, MD sent at 08/19/2016  6:04 PM EST ----- Call pt: lab all good but Dr Darnell Level noticed BP running high at time of visit Mon. She needs to come back this week to check again. I also let her primary care MD know.

## 2016-08-21 NOTE — Telephone Encounter (Signed)
Called pt - no answer; left message on home #  - need to re-check BP and to give me a call back.

## 2016-08-21 NOTE — Telephone Encounter (Signed)
Pt called - stated she will try to come today for BP re-check.

## 2016-08-21 NOTE — Progress Notes (Signed)
Pt called and stated she will try to be here today for BP re-check.

## 2016-09-02 ENCOUNTER — Telehealth: Payer: Self-pay | Admitting: *Deleted

## 2016-09-02 NOTE — Telephone Encounter (Signed)
Talked to Dr Beryle Beams about pt's medication / BP. Stated pt take HCTZ every other day until seen by PCP.  Called pt back - informed of the above. Stated she has an appt in July. Instructed to call PCP to make an appt as soon as possible so he can monitor her BP. Voiced understanding; she will call PCP's office for an earlier appt.

## 2016-09-02 NOTE — Telephone Encounter (Signed)
Returned pt's call - since start taking HCTZ 12.5 mg daily she has been dizzy and weak. She also takes Losartan. BP - 110/80 and 112/78 today. Please advise. Thanks

## 2016-09-17 DIAGNOSIS — S7001XD Contusion of right hip, subsequent encounter: Secondary | ICD-10-CM | POA: Diagnosis not present

## 2016-09-17 DIAGNOSIS — M7061 Trochanteric bursitis, right hip: Secondary | ICD-10-CM | POA: Diagnosis not present

## 2016-09-17 DIAGNOSIS — M25551 Pain in right hip: Secondary | ICD-10-CM | POA: Diagnosis not present

## 2016-09-29 ENCOUNTER — Other Ambulatory Visit (INDEPENDENT_AMBULATORY_CARE_PROVIDER_SITE_OTHER): Payer: 59

## 2016-09-29 ENCOUNTER — Other Ambulatory Visit: Payer: Self-pay | Admitting: Oncology

## 2016-09-29 ENCOUNTER — Ambulatory Visit (INDEPENDENT_AMBULATORY_CARE_PROVIDER_SITE_OTHER): Payer: 59 | Admitting: Pharmacist

## 2016-09-29 DIAGNOSIS — G44301 Post-traumatic headache, unspecified, intractable: Secondary | ICD-10-CM

## 2016-09-29 DIAGNOSIS — I10 Essential (primary) hypertension: Secondary | ICD-10-CM

## 2016-09-29 DIAGNOSIS — Z7901 Long term (current) use of anticoagulants: Secondary | ICD-10-CM

## 2016-09-29 LAB — BASIC METABOLIC PANEL
ANION GAP: 8 (ref 5–15)
BUN: 17 mg/dL (ref 6–20)
CALCIUM: 9.5 mg/dL (ref 8.9–10.3)
CO2: 27 mmol/L (ref 22–32)
Chloride: 90 mmol/L — ABNORMAL LOW (ref 101–111)
Creatinine, Ser: 0.81 mg/dL (ref 0.44–1.00)
GFR calc Af Amer: >60 mL/min (ref >60)
GLUCOSE: 84 mg/dL (ref 65–99)
Potassium: 3.9 mmol/L (ref 3.5–5.1)
Sodium: 125 mmol/L — ABNORMAL LOW (ref 135–145)

## 2016-09-29 NOTE — Progress Notes (Signed)
Reviewed Thanks Dr Darnell Level Note: K normal but Na low - RN will call her to stop HCTZ. I put in order for brain CT to R/O subdural.

## 2016-09-29 NOTE — Patient Instructions (Signed)
Patient instructed to take medications as defined in the Anti-coagulation Track section of this encounter.  Patient instructed to take today's dose.  Patient instructed to take 2 tablets of your peach-colored warfarin 5mg  strength tablets on Mondays, Wednesdays and Fridays; on all other days--take 1 & 1/2 tablets of your peach-colored 5mg  strength warfarin tablets.  Patient verbalized understanding of these instructions.

## 2016-09-29 NOTE — Progress Notes (Signed)
Anti-Coagulation Progress Note  Tracy Peters is a 83 y.o. female who is currently on an anti-coagulation regimen.    RECENT RESULTS: Recent results are below, the most recent result is correlated with a dose of 60 mg. per week: Lab Results  Component Value Date   INR 1.90 08/18/2016   INR 2.0 08/04/2016   INR 2.50 05/26/2016   PROTIME 39.6 (H) 04/03/2015    ANTI-COAG DOSE:    ANTICOAG SUMMARY: Anticoagulation Episode Summary    Current INR goal:   2.0-3.0  TTR:   66.6 % (5.3 y)  Next INR check:   09/29/2016  INR from last check:   1.90! (08/18/2016)  Weekly max dose:     Target end date:     INR check location:     Preferred lab:     Send INR reminders to:        Comments:         Anticoagulation Care Providers    Provider Role Specialty Phone number   Annia Belt, MD Referring Oncology [phone]      ANTICOAG TODAY: Anticoagulation Summary  As of 09/29/2016   INR goal:   2.0-3.0  TTR:   66.6 % (5.3 y)  Today's INR:     Next INR check:     Target end date:         Anticoagulation Episode Summary    INR check location:      Preferred lab:      Send INR reminders to:      Comments:       Anticoagulation Care Providers    Provider Role Specialty Phone number   Annia Belt, MD Referring Oncology [phone]      PATIENT INSTRUCTIONS: Patient Instructions  Patient instructed to take medications as defined in the Anti-coagulation Track section of this encounter.  Patient instructed to take today's dose.  Patient instructed to take 2 tablets of your peach-colored warfarin 5mg  strength tablets on Mondays, Wednesdays and Fridays; on all other days--take 1 & 1/2 tablets of your peach-colored 5mg  strength warfarin tablets.  Patient verbalized understanding of these instructions.       FOLLOW-UP Return in 7 weeks (on 11/17/2016) for Follow up INR at 1130h.  Jorene Guest, III Pharm.D., CACP

## 2016-09-30 ENCOUNTER — Ambulatory Visit (HOSPITAL_COMMUNITY)
Admission: RE | Admit: 2016-09-30 | Discharge: 2016-09-30 | Disposition: A | Discharge status: Home / Self Care | Payer: 59 | Source: Ambulatory Visit | Attending: Oncology | Admitting: Oncology

## 2016-09-30 ENCOUNTER — Telehealth: Payer: Self-pay | Admitting: *Deleted

## 2016-09-30 ENCOUNTER — Ambulatory Visit (HOSPITAL_COMMUNITY): Payer: 59

## 2016-09-30 DIAGNOSIS — Z7901 Long term (current) use of anticoagulants: Secondary | ICD-10-CM | POA: Diagnosis not present

## 2016-09-30 DIAGNOSIS — I6789 Other cerebrovascular disease: Secondary | ICD-10-CM | POA: Diagnosis not present

## 2016-09-30 DIAGNOSIS — I739 Peripheral vascular disease, unspecified: Secondary | ICD-10-CM | POA: Insufficient documentation

## 2016-09-30 DIAGNOSIS — S0990XA Unspecified injury of head, initial encounter: Secondary | ICD-10-CM | POA: Diagnosis not present

## 2016-09-30 DIAGNOSIS — G3189 Other specified degenerative diseases of nervous system: Secondary | ICD-10-CM | POA: Insufficient documentation

## 2016-09-30 DIAGNOSIS — G44301 Post-traumatic headache, unspecified, intractable: Secondary | ICD-10-CM

## 2016-09-30 NOTE — Telephone Encounter (Signed)
Pt informed "potassium level is OK but sodium low. Advise her to stop HCTZ." per Dr Beryle Beams. She has not scheduled an appt to see her pcp - Michela Pitcher she will call their office tomorrow or go there since it's close by. CT scheduled for this afternoon.

## 2016-09-30 NOTE — Telephone Encounter (Signed)
Called pt - no answer; left message to give me a call back. 

## 2016-09-30 NOTE — Telephone Encounter (Signed)
-----   Message from Annia Belt, MD sent at 09/29/2016  1:12 PM EDT ----- Call pt: potassium level is OK but sodium low. Advise her to stop HCTZ. Did she ever make appt w her primary care MD? Also: need pre-auth to get non contrast CT brain, R/O subdural hematoma. Fall, head trauma, chronic anticoagulation,persistent headache

## 2016-10-06 ENCOUNTER — Telehealth: Payer: Self-pay | Admitting: *Deleted

## 2016-10-06 NOTE — Telephone Encounter (Signed)
Pt called / informed "no sign of bleeding in head on CT scan." per Dr Beryle Beams. Pt stated she's glad; balance is still off but being carefully.

## 2016-10-06 NOTE — Telephone Encounter (Signed)
-----   Message from Annia Belt, MD sent at 10/01/2016 10:00 AM EDT ----- Call pt: no sign of bleeding in head on CT scan.

## 2016-10-29 DIAGNOSIS — M25551 Pain in right hip: Secondary | ICD-10-CM | POA: Diagnosis not present

## 2016-10-29 DIAGNOSIS — M7061 Trochanteric bursitis, right hip: Secondary | ICD-10-CM | POA: Diagnosis not present

## 2016-11-04 DIAGNOSIS — S96811A Strain of other specified muscles and tendons at ankle and foot level, right foot, initial encounter: Secondary | ICD-10-CM | POA: Diagnosis not present

## 2016-11-04 DIAGNOSIS — M25571 Pain in right ankle and joints of right foot: Secondary | ICD-10-CM | POA: Diagnosis not present

## 2016-11-17 ENCOUNTER — Ambulatory Visit (INDEPENDENT_AMBULATORY_CARE_PROVIDER_SITE_OTHER): Payer: 59 | Admitting: Pharmacist

## 2016-11-17 DIAGNOSIS — I824Y9 Acute embolism and thrombosis of unspecified deep veins of unspecified proximal lower extremity: Secondary | ICD-10-CM

## 2016-11-17 DIAGNOSIS — D6859 Other primary thrombophilia: Secondary | ICD-10-CM | POA: Diagnosis not present

## 2016-11-17 DIAGNOSIS — Z7901 Long term (current) use of anticoagulants: Secondary | ICD-10-CM | POA: Diagnosis not present

## 2016-11-17 DIAGNOSIS — I742 Embolism and thrombosis of arteries of the upper extremities: Secondary | ICD-10-CM

## 2016-11-17 DIAGNOSIS — Z86718 Personal history of other venous thrombosis and embolism: Secondary | ICD-10-CM | POA: Diagnosis not present

## 2016-11-17 LAB — POCT INR: INR: 2.8

## 2016-11-17 NOTE — Progress Notes (Signed)
Anti-Coagulation Progress Note  Tracy Peters is a 82 y.o. female who is currently on an anti-coagulation regimen.    RECENT RESULTS: Recent results are below, the most recent result is correlated with a dose of 60 mg. per week for primary hypercoagulable state ([place]) [D68.59] with history of upper extremity DVT who is on indefinite anticoagulation to prevent recurrence of VTE. Annual discussion weighing continued benefit vs risks for use of oral anticoagulation with warfarin should be conducted with the patient to discuss.  Lab Results  Component Value Date   INR 2.80 11/17/2016   INR 1.90 08/18/2016   INR 2.0 08/04/2016   PROTIME 39.6 (H) 04/03/2015    ANTI-COAG DOSE: Anticoagulation Warfarin Dose Instructions as of 11/17/2016      Dorene Grebe Tue Wed Thu Fri Sat   New Dose 7.5 mg 10 mg 7.5 mg 10 mg 7.5 mg 10 mg 7.5 mg    Description   Take 2 tablets on TUESDAYS and FRIDAYS; all other days--take 1 & 1/2 tablets. DR Azucena Freed PATIENT (route warfarin notes and list as authorizing provider for LOS & Follow-up, lab orders and warfarin prescriptions), do not list attending physician       ANTICOAG SUMMARY: Anticoagulation Episode Summary    Current INR goal:   2.0-3.0  TTR:   67.6 % (5.5 y)  Next INR check:   01/12/2017  INR from last check:   2.80 (11/17/2016)  Weekly max warfarin dose:     Target end date:     INR check location:     Preferred lab:     Send INR reminders to:        Comments:         Anticoagulation Care Providers    Provider Role Specialty Phone number   Annia Belt, MD Referring Oncology [phone]      ANTICOAG TODAY: Anticoagulation Summary  As of 11/17/2016   INR goal:   2.0-3.0  TTR:     Today's INR:   2.80  Next INR check:   01/12/2017  Target end date:         Anticoagulation Episode Summary    INR check location:      Preferred lab:      Send INR reminders to:      Comments:       Anticoagulation Care Providers    Provider Role  Specialty Phone number   Annia Belt, MD Referring Oncology [phone]      PATIENT INSTRUCTIONS: Patient Instructions  Patient instructed to take medications as defined in the Anti-coagulation Track section of this encounter.  Patient instructed to take today's dose.  Patient instructed to take 2 tablets on TUESDAYS and FRIDAYS; all other days--take 1 & 1/2 tablets. Patient verbalized understanding of these instructions.       FOLLOW-UP Return in 8 weeks (on 01/12/2017) for Follow up INR at 1130h.  Jorene Guest, III Pharm.D., CACP

## 2016-11-17 NOTE — Patient Instructions (Signed)
Patient instructed to take medications as defined in the Anti-coagulation Track section of this encounter.  Patient instructed to take today's dose.  Patient instructed to take 2 tablets on TUESDAYS and FRIDAYS; all other days--take 1 & 1/2 tablets. Patient verbalized understanding of these instructions.

## 2016-11-18 NOTE — Progress Notes (Signed)
Reviewed Thanks DrG 

## 2016-11-26 DIAGNOSIS — S96811D Strain of other specified muscles and tendons at ankle and foot level, right foot, subsequent encounter: Secondary | ICD-10-CM | POA: Diagnosis not present

## 2016-11-26 DIAGNOSIS — M25571 Pain in right ankle and joints of right foot: Secondary | ICD-10-CM | POA: Diagnosis not present

## 2017-01-07 DIAGNOSIS — R198 Other specified symptoms and signs involving the digestive system and abdomen: Secondary | ICD-10-CM | POA: Diagnosis not present

## 2017-01-07 DIAGNOSIS — Z86010 Personal history of colonic polyps: Secondary | ICD-10-CM | POA: Diagnosis not present

## 2017-01-07 DIAGNOSIS — K644 Residual hemorrhoidal skin tags: Secondary | ICD-10-CM | POA: Diagnosis not present

## 2017-01-08 ENCOUNTER — Ambulatory Visit: Payer: 59 | Admitting: Internal Medicine

## 2017-01-12 ENCOUNTER — Ambulatory Visit (INDEPENDENT_AMBULATORY_CARE_PROVIDER_SITE_OTHER): Payer: 59 | Admitting: Pharmacist

## 2017-01-12 DIAGNOSIS — Z7901 Long term (current) use of anticoagulants: Secondary | ICD-10-CM | POA: Diagnosis not present

## 2017-01-12 DIAGNOSIS — D6859 Other primary thrombophilia: Secondary | ICD-10-CM | POA: Diagnosis not present

## 2017-01-12 DIAGNOSIS — Z86718 Personal history of other venous thrombosis and embolism: Secondary | ICD-10-CM

## 2017-01-12 DIAGNOSIS — I742 Embolism and thrombosis of arteries of the upper extremities: Secondary | ICD-10-CM | POA: Diagnosis not present

## 2017-01-12 LAB — POCT INR: INR: 2.9

## 2017-01-12 NOTE — Patient Instructions (Signed)
Patient instructed to take medications as defined in the Anti-coagulation Track section of this encounter.  Patient instructed to take today's dose.  Patient instructed to take 1  & 1/2 tablets of your 5mg  peach-colored warfarin tablets by mouth, once-daily--at 6PM each day.  Patient verbalized understanding of these instructions.

## 2017-01-12 NOTE — Progress Notes (Signed)
Anticoagulation Management Tracy Peters is a 83 y.o. female who reports to the clinic for monitoring of warfarin treatment.    Indication: DVT, history of; thrombosis of right radial artery, primary hypercoagulable state, chronic anticoagulation. Duration: indefinite Supervising physician: Murriel Hopper  Anticoagulation Clinic Visit History: Patient does not report signs/symptoms of bleeding or thromboembolism  Other recent changes: No diet, medications, lifestyle changes endorsed by the patient.  Anticoagulation Episode Summary    Current INR goal:   2.0-3.0  TTR:   68.5 % (5.7 y)  Next INR check:   01/12/2017  INR from last check:   2.90 (01/12/2017)  Weekly max warfarin dose:     Target end date:   Indefinite  INR check location:   [facility]  Preferred lab:     Send INR reminders to:      Indications   Chronic anticoagulation [Z79.01] DVT HX OF [Z86.718] 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[facility]) [I74.2] Primary hypercoagulable state ([place]) [D68.59] [D68.59] Acute venous embolism and thrombosis of deep vessels of proximal lower extremity ([place]) [I82.4Y9] [I82.4Y9] Long term (current) use of anticoagulants [Z79.01] [Z79.01]       Comments:         Anticoagulation Care Providers    Provider Role Specialty Phone number   Annia Belt, MD Referring Oncology [phone]      Allergies  Allergen Reactions  . Bee Venom Anaphylaxis    Yellow jackets, wasps as well  . Ivp Dye [Iodinated Diagnostic Agents] Anaphylaxis  . Shellfish Allergy Anaphylaxis  . Amlodipine Besylate     REACTION: swelling  . Aspirin     REACTION: unspecified  . Atorvastatin     REACTION: diarrhea  . Cholestyramine     REACTION: mouth irritation  . Codeine Phosphate Nausea And Vomiting  . Demerol [Meperidine]     Severe Hallucination!!!!  . Diphenhydramine Hcl Other (See Comments)    hyperactive  . Doxycycline     REACTION: nausea  . Gentamicin Sulfate     REACTION:  unspecified  . Meclizine Hcl     REACTION: more dizziness on it  . Meperidine Hcl   . Metronidazole     REACTION: bad taste  . Moxifloxacin     REACTION: insomnia Able to use Cipro  . Sulfamethoxazole     Kidney problems  . Penicillins Swelling and Rash   Prior to Admission medications   Medication Sig Start Date End Date Taking? Authorizing Provider  acyclovir ointment (ZOVIRAX) 5 % Apply 1 application topically daily as needed (for outbreak). 07/12/14  Yes Plotnikov, Evie Lacks, MD  Cholecalciferol (VITAMIN D3) 50000 units CAPS Take 1 capsule by mouth every 14 (fourteen) days. 07/14/16  Yes Plotnikov, Evie Lacks, MD  diphenoxylate-atropine (LOMOTIL) 2.5-0.025 MG tablet Take 1 tablet by mouth 2 (two) times daily as needed for diarrhea or loose stools. 07/14/16  Yes Plotnikov, Evie Lacks, MD  docusate sodium 100 MG CAPS Take 100 mg by mouth 2 (two) times daily. 01/20/14  Yes Newman Pies, MD  EPINEPHrine 0.3 mg/0.3 mL IJ SOAJ injection As dirrected 01/08/16  Yes Plotnikov, Evie Lacks, MD  fexofenadine (ALLEGRA) 180 MG tablet Take 180 mg by mouth daily as needed for allergies.  05/28/11  Yes Plotnikov, Evie Lacks, MD  fish oil-omega-3 fatty acids 1000 MG capsule Take 2 g by mouth daily.    Yes [provider]  fluticasone (FLONASE) 50 MCG/ACT nasal spray Place 2 sprays into both nostrils daily. 07/14/16  Yes Plotnikov, Evie Lacks, MD  hydrochlorothiazide (MICROZIDE) 12.5 MG capsule Take 1 capsule (12.5 mg total) by mouth daily. 08/21/16  Yes Annia Belt, MD  loperamide (IMODIUM A-D) 2 MG tablet Take 2 mg by mouth 4 (four) times daily as needed for diarrhea or loose stools. Will take one by mouth 56min before meals or two as needed.   Yes [provider]  losartan (COZAAR) 100 MG tablet Take 1 tablet (100 mg total) by mouth daily. 07/14/16  Yes Plotnikov, Evie Lacks, MD  nitrofurantoin, macrocrystal-monohydrate, (MACROBID) 100 MG capsule Take 1 capsule (100 mg total) by mouth 2  (two) times daily. 08/31/15  Yes Plotnikov, Evie Lacks, MD  Probiotic Product (ALIGN) 4 MG CAPS Take by mouth daily.     Yes [provider]  traMADol (ULTRAM) 50 MG tablet Take 1-2 tablets (50-100 mg total) by mouth every 12 (twelve) hours as needed for moderate pain. 07/14/16  Yes Plotnikov, Evie Lacks, MD  triamcinolone ointment (KENALOG) 0.5 % Apply topically 4 (four) times daily. 07/13/15  Yes Plotnikov, Evie Lacks, MD  valACYclovir (VALTREX) 1000 MG tablet Take 1 tablet (1,000 mg total) by mouth 2 (two) times daily. 07/14/16  Yes Plotnikov, Evie Lacks, MD  warfarin (COUMADIN) 5 MG tablet Take 2 tablets on Mondays of every week. All other days--take 1&1/2 tablets. 08/04/16  Yes Annia Belt, MD  zolpidem (AMBIEN) 10 MG tablet 1 & 1/2 TABLETS (15MG ) BY MOUTH AT BEDTIME AS NEEDED FOR SLEEP, OK TOREPEAT IN 4HRS 07/31/16  Yes Plotnikov, Evie Lacks, MD   Past Medical History:  Diagnosis Date  . Adenomatous colon polyp   . Allergic rhinitis    uses FLonase nightly  . Anemia   . Arthritis    joint pain  . Blood transfusion   . Bronchitis    hx of-7-45yrs ago  . Carpal tunnel syndrome    numbness and tingling   . Cataracts, bilateral   . Chronic anticoagulation 03/21/2013  . Congenital absence of one kidney   . Diarrhea    takes Immodium daily as needed or Lomotil   . Diverticulosis   . GERD (gastroesophageal reflux disease)    mild  . Herpes    takes Valtrex daily  . History of colon polyps   . History of DVT (deep vein thrombosis) 1998   right arm  . History of staph infection   . Hyperlipidemia    Medical MD doesn't think its absolutely necessary  . Hypertension    takes Losartan daily  . IBS (irritable bowel syndrome)    takes Electronics engineer daily  . Insomnia    takes Ambien nightly as needed  . LBP (low back pain)   . Long term (current) use of anticoagulants    Dr. Beryle Beams  . Nocturia   . Osteoporosis   . Raynaud's disease   . 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 Winfield Cunas  . 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 Urinary leakage   . UTI (urinary tract infection)    Social History   Social History  . Marital status: Married    Spouse name: N/A  . Number of children: 2  . Years of education: N/A   Occupational History  . Retired Retired   Social History Main Topics  . Smoking status: Never Smoker  . Smokeless tobacco: Never Used  . Alcohol use No  . Drug use: No  . Sexual activity: Not on file  Other Topics Concern  . Not on file   Social History Narrative  . No narrative on file   Family History  Problem Relation Age of Onset  . Breast cancer Sister   . Kidney disease Sister 14       nephritis  . Heart disease Brother   . Osteoarthritis Other   . Parkinsonism Mother   . Ulcerative colitis Sister   . Colon cancer Neg Hx     ASSESSMENT Recent Results: The most recent result is correlated with 57.5 mg per week: Lab Results  Component Value Date   INR 2.90 01/12/2017   INR 2.80 11/17/2016   INR 1.90 08/18/2016   PROTIME 39.6 (H) 04/03/2015    Anticoagulation Dosing: INR as of 01/12/2017 and Previous Warfarin Dosing Information    INR Dt INR Goal Wkly Tot Sun Mon Tue Wed Thu Fri Sat   01/12/2017 2.90 2.0-3.0 57.5 mg 7.5 mg 7.5 mg 10 mg 7.5 mg 7.5 mg 10 mg 7.5 mg   Patient deviated from recommended dosing.       Previous description   Take 2 tablets on TUESDAYS and FRIDAYS; all other days--take 1 & 1/2 tablets. DR Azucena Freed PATIENT (route warfarin notes and list as authorizing provider for LOS & Follow-up, lab orders and warfarin prescriptions), do not list attending physician    Anticoagulation Warfarin Dose Instructions as of 01/12/2017      Total Sun Mon Tue Wed Thu Fri Sat   New Dose 52.5 mg 7.5 mg 7.5 mg 7.5 mg 7.5 mg 7.5 mg 7.5 mg 7.5 mg     (5 mg x 1.5)  (5 mg x 1.5)  (5 mg x 1.5)  (5 mg x 1.5)  (5 mg x 1.5)  (5 mg x 1.5)  (5 mg x 1.5)                          Description   Take 1 & 1/2 tablets by mouth once-daily at [facility] each day. DR Azucena Freed PATIENT (route warfarin notes and list as authorizing provider for LOS & Follow-up, lab orders and warfarin prescriptions), do not list attending physician      INR today: Therapeutic  PLAN Weekly dose was decreased by 8% to 52.5 mg per week  Patient Instructions  Patient instructed to take medications as defined in the Anti-coagulation Track section of this encounter.  Patient instructed to take today's dose.  Patient instructed to take 1  & 1/2 tablets of your 5mg  peach-colored warfarin tablets by mouth, once-daily--at 6PM each day.  Patient verbalized understanding of these instructions.     Patient advised to contact clinic or seek medical attention if signs/symptoms of bleeding or thromboembolism occur.  Patient verbalized understanding by repeating back information and was advised to contact me if further medication-related questions arise. Patient was also provided an information handout.  Follow-up Return in 8 weeks (on 03/09/2017) for Follow up INR at 1130h.  Caryl Bis PharmD, CACP, CPP  15 minutes spent face-to-face with the patient during the encounter. 50% of time spent on education. 50% of time was spent on fingerstick point of care INR sample collection, processing, result interpretation and discussion with the patient and data entry in to EPIC/CHL and [email or web address]

## 2017-01-13 NOTE — Progress Notes (Signed)
Reviewed thx DrG 

## 2017-01-27 ENCOUNTER — Ambulatory Visit (INDEPENDENT_AMBULATORY_CARE_PROVIDER_SITE_OTHER): Payer: 59 | Admitting: Internal Medicine

## 2017-01-27 ENCOUNTER — Encounter: Payer: Self-pay | Admitting: Internal Medicine

## 2017-01-27 DIAGNOSIS — L570 Actinic keratosis: Secondary | ICD-10-CM | POA: Diagnosis not present

## 2017-01-27 DIAGNOSIS — I824Y9 Acute embolism and thrombosis of unspecified deep veins of unspecified proximal lower extremity: Secondary | ICD-10-CM | POA: Diagnosis not present

## 2017-01-27 DIAGNOSIS — I10 Essential (primary) hypertension: Secondary | ICD-10-CM

## 2017-01-27 DIAGNOSIS — H6121 Impacted cerumen, right ear: Secondary | ICD-10-CM | POA: Insufficient documentation

## 2017-01-27 DIAGNOSIS — H6120 Impacted cerumen, unspecified ear: Secondary | ICD-10-CM | POA: Diagnosis not present

## 2017-01-27 DIAGNOSIS — I742 Embolism and thrombosis of arteries of the upper extremities: Secondary | ICD-10-CM

## 2017-01-27 MED ORDER — DIPHENOXYLATE-ATROPINE 2.5-0.025 MG PO TABS: 1.0000 | ORAL_TABLET | Freq: Two times a day (BID) | ORAL | 2 refills | Status: DC | PRN

## 2017-01-27 MED ORDER — WARFARIN SODIUM 5 MG PO TABS: ORAL_TABLET | ORAL | 3 refills | Status: DC

## 2017-01-27 MED ORDER — TRAMADOL HCL 50 MG PO TABS: 50.0000 mg | ORAL_TABLET | Freq: Two times a day (BID) | ORAL | 1 refills | Status: DC | PRN

## 2017-01-27 MED ORDER — LOSARTAN POTASSIUM 100 MG PO TABS: 100.0000 mg | ORAL_TABLET | Freq: Every day | ORAL | 3 refills | Status: DC

## 2017-01-27 MED ORDER — HYDROCHLOROTHIAZIDE 12.5 MG PO CAPS: 12.5000 mg | ORAL_CAPSULE | Freq: Every day | ORAL | 3 refills | Status: DC

## 2017-01-27 MED ORDER — ZOLPIDEM TARTRATE 10 MG PO TABS: ORAL_TABLET | ORAL | 1 refills | Status: DC

## 2017-01-27 MED ORDER — FLUTICASONE PROPIONATE 50 MCG/ACT NA SUSP: 2.0000 | Freq: Every day | NASAL | 3 refills | Status: DC

## 2017-01-27 MED ORDER — VALACYCLOVIR HCL 1 G PO TABS: 1000.0000 mg | ORAL_TABLET | Freq: Two times a day (BID) | ORAL | 3 refills | Status: DC

## 2017-01-27 MED ORDER — EPINEPHRINE 0.3 MG/0.3ML IJ SOAJ: INTRAMUSCULAR | 3 refills | Status: DC

## 2017-01-27 NOTE — Assessment & Plan Note (Signed)
See Cryo 

## 2017-01-27 NOTE — Progress Notes (Signed)
Subjective:  Patient ID: Tracy Peters, female    DOB: 04/02/1934  Age: 83 y.o. MRN: [id number]  CC: No chief complaint on file.   HPI Tracy Peters presents for HTN, insomnia, IBS f/u C/o ear wax C/o skin lesion on temple  Outpatient Medications Prior to Visit  Medication Sig Dispense Refill  . acyclovir ointment (ZOVIRAX) 5 % Apply 1 application topically daily as needed (for outbreak). 15 g 2  . Cholecalciferol (VITAMIN D3) 50000 units CAPS Take 1 capsule by mouth every 14 (fourteen) days. 6 capsule 15  . diphenoxylate-atropine (LOMOTIL) 2.5-0.025 MG tablet Take 1 tablet by mouth 2 (two) times daily as needed for diarrhea or loose stools. 30 tablet 2  . docusate sodium 100 MG CAPS Take 100 mg by mouth 2 (two) times daily. 60 capsule 0  . EPINEPHrine 0.3 mg/0.3 mL IJ SOAJ injection As dirrected 1 Device 3  . fexofenadine (ALLEGRA) 180 MG tablet Take 180 mg by mouth daily as needed for allergies.     . fish oil-omega-3 fatty acids 1000 MG capsule Take 2 g by mouth daily.     . fluticasone (FLONASE) 50 MCG/ACT nasal spray Place 2 sprays into both nostrils daily. 16 g 0  . hydrochlorothiazide (MICROZIDE) 12.5 MG capsule Take 1 capsule (12.5 mg total) by mouth daily. 60 capsule 3  . loperamide (IMODIUM A-D) 2 MG tablet Take 2 mg by mouth 4 (four) times daily as needed for diarrhea or loose stools. Will take one by mouth 61min before meals or two as needed.    Marland Kitchen losartan (COZAAR) 100 MG tablet Take 1 tablet (100 mg total) by mouth daily. 90 tablet 3  . nitrofurantoin, macrocrystal-monohydrate, (MACROBID) 100 MG capsule Take 1 capsule (100 mg total) by mouth 2 (two) times daily. 20 capsule 0  . Probiotic Product (ALIGN) 4 MG CAPS Take by mouth daily.      . traMADol (ULTRAM) 50 MG tablet Take 1-2 tablets (50-100 mg total) by mouth every 12 (twelve) hours as needed for moderate pain. 90 tablet 5  . triamcinolone ointment (KENALOG) 0.5 % Apply topically 4 (four) times daily. 30 g 3  .  valACYclovir (VALTREX) 1000 MG tablet Take 1 tablet (1,000 mg total) by mouth 2 (two) times daily. 60 tablet 11  . warfarin (COUMADIN) 5 MG tablet Take 2 tablets on Mondays of every week. All other days--take 1&1/2 tablets. 45 tablet 11  . zolpidem (AMBIEN) 10 MG tablet 1 & 1/2 TABLETS (15MG ) BY MOUTH AT BEDTIME AS NEEDED FOR SLEEP, OK TOREPEAT IN 4HRS 135 tablet 0   No facility-administered medications prior to visit.     ROS Review of Systems  Constitutional: Negative for activity change, appetite change, chills, fatigue and unexpected weight change.  HENT: Negative for congestion, mouth sores and sinus pressure.   Eyes: Negative for visual disturbance.  Respiratory: Negative for cough and chest tightness.   Gastrointestinal: Negative for abdominal pain and nausea.  Genitourinary: Negative for difficulty urinating, frequency and vaginal pain.  Musculoskeletal: Positive for arthralgias and back pain. Negative for gait problem.  Skin: Negative for pallor and rash.  Neurological: Negative for dizziness, tremors, weakness, numbness and headaches.  Psychiatric/Behavioral: Negative for confusion, sleep disturbance and suicidal ideas.    Objective:  BP (!) 150/86 (BP Location: Left Arm, Patient Position: Sitting, Cuff Size: Normal)   Pulse 70   Temp 97.7 F (36.5 C) (Oral)   Ht 5\' 5"  (1.651 m)   Wt 125 lb (56.7 kg)  SpO2 100%   BMI 20.80 kg/m   BP Readings from Last 3 Encounters:  01/27/17 (!) 150/86  08/18/16 (!) 174/87  07/14/16 122/76    Wt Readings from Last 3 Encounters:  01/27/17 125 lb (56.7 kg)  08/18/16 124 lb 11.2 oz (56.6 kg)  07/14/16 124 lb (56.2 kg)    Physical Exam  Constitutional: She appears well-developed. No distress.  HENT:  Head: Normocephalic.  Right Ear: External ear normal.  Left Ear: External ear normal.  Nose: Nose normal.  Mouth/Throat: Oropharynx is clear and moist.  Eyes: Pupils are equal, round, and reactive to light. Conjunctivae are  normal. Right eye exhibits no discharge. Left eye exhibits no discharge.  Neck: Normal range of motion. Neck supple. No JVD present. No tracheal deviation present. No thyromegaly present.  Cardiovascular: Normal rate, regular rhythm and normal heart sounds.   Pulmonary/Chest: No stridor. No respiratory distress. She has no wheezes.  Abdominal: Soft. Bowel sounds are normal. She exhibits no distension and no mass. There is no tenderness. There is no rebound and no guarding.  Musculoskeletal: She exhibits no edema or tenderness.  Lymphadenopathy:    She has no cervical adenopathy.  Neurological: She displays normal reflexes. No cranial nerve deficit. She exhibits normal muscle tone. Coordination normal.  Skin: No rash noted. No erythema.  Psychiatric: She has a normal mood and affect. Her behavior is normal. Judgment and thought content normal.   AK on L temple Wax R ear   Procedure Note :     Procedure : Cryosurgery   Indication:  Actinic keratosis(es)   Risks including unsuccessful procedure , bleeding, infection, bruising, scar, a need for a repeat  procedure and others were explained to the patient in detail as well as the benefits. Informed consent was obtained verbally.    1 lesion(s)  on L temple was/were treated with liquid nitrogen on a Q-tip in a usual fasion . Band-Aid was applied and antibiotic ointment was given for a later use.   Tolerated well. Complications none.   Procedure Note :     Procedure :  Ear wax removal R   Indication:  Cerumen impaction    Risks, including pain, dizziness, eardrum perforation, bleeding, infection and others as well as benefits were explained to the patient in detail. Verbal consent was obtained and the patient agreed to proceed.   Procedure has also required manual wax removal with an ear loop.   Tolerated well. Complications: None.   Postprocedure instructions :  Call if problems.        Lab Results  Component Value Date    WBC 7.8 08/18/2016   HGB 12.6 08/18/2016   HCT 36.9 08/18/2016   PLT 375 08/18/2016   GLUCOSE 84 09/29/2016   CHOL 196 01/03/2016   TRIG 85.0 01/03/2016   HDL 63.10 01/03/2016   LDLDIRECT 129.8 05/31/2012   LDLCALC 116 (H) 01/03/2016   ALT 14 08/18/2016   AST 17 08/18/2016   NA 125 (L) 09/29/2016   K 3.9 09/29/2016   CL 90 (L) 09/29/2016   CREATININE 0.81 09/29/2016   BUN 17 09/29/2016   CO2 27 09/29/2016   TSH 2.69 01/03/2016   INR 2.90 01/12/2017    Ct Head Wo Contrast  Result Date: 09/30/2016 CLINICAL DATA:  [place] 1 week ago.  Evaluate for hemorrhage. EXAM: CT HEAD WITHOUT CONTRAST TECHNIQUE: Contiguous axial images were obtained from the base of the skull through the vertex without intravenous contrast. COMPARISON:  CT head 06/15/2008. FINDINGS:  Brain: No evidence for acute infarction, hemorrhage, mass lesion, hydrocephalus, or extra-axial fluid. Generalized atrophy. Chronic microvascular ischemic change. Vascular: No hyperdense vessel or unexpected calcification. Skull: Normal. Negative for fracture or focal lesion. Sinuses/Orbits: No acute finding. Other: None. Compared with priors, similar appearance. IMPRESSION: Atrophy and small vessel disease. No skull fracture or intracranial hemorrhage. Electronically Signed   By: Staci Righter M.D.   On: 09/30/2016 17:37    Assessment & Plan:   Diagnoses and all orders for this visit:  HTN (hypertension), benign -     hydrochlorothiazide (MICROZIDE) 12.5 MG capsule; Take 1 capsule (12.5 mg total) by mouth daily.  Other orders -     diphenoxylate-atropine (LOMOTIL) 2.5-0.025 MG tablet; Take 1 tablet by mouth 2 (two) times daily as needed for diarrhea or loose stools. -     EPINEPHrine 0.3 mg/0.3 mL IJ SOAJ injection; As dirrected -     fluticasone (FLONASE) 50 MCG/ACT nasal spray; Place 2 sprays into both nostrils daily. -     losartan (COZAAR) 100 MG tablet; Take 1 tablet (100 mg total) by mouth daily. -     traMADol (ULTRAM) 50 MG  tablet; Take 1-2 tablets (50-100 mg total) by mouth every 12 (twelve) hours as needed for moderate pain. -     valACYclovir (VALTREX) 1000 MG tablet; Take 1 tablet (1,000 mg total) by mouth 2 (two) times daily. -     zolpidem (AMBIEN) 10 MG tablet; 1 & 1/2 TABLETS (15MG ) BY MOUTH AT BEDTIME AS NEEDED FOR SLEEP, OK TOREPEAT IN 4HRS -     warfarin (COUMADIN) 5 MG tablet; Take 2 tablets on Mondays of every week. All other days--take 1&1/2 tablets.   I am having Ms. Giacomo maintain her ALIGN, loperamide, fish oil-omega-3 fatty acids, fexofenadine, DSS, acyclovir ointment, triamcinolone ointment, nitrofurantoin (macrocrystal-monohydrate), EPINEPHrine, valACYclovir, traMADol, losartan, fluticasone, diphenoxylate-atropine, Vitamin D3, zolpidem, warfarin, and hydrochlorothiazide.  No orders of the defined types were placed in this encounter.    Follow-up: No Follow-up on file.  [name], MD

## 2017-01-27 NOTE — Patient Instructions (Signed)
   Postprocedure instructions :     Keep the wounds clean. You can wash them with liquid soap and water. Pat dry with gauze or a Kleenex tissue  Before applying antibiotic ointment and a Band-Aid.   You need to report immediately  if  any signs of infection develop.    

## 2017-01-27 NOTE — Assessment & Plan Note (Signed)
On coumadin 

## 2017-01-27 NOTE — Assessment & Plan Note (Signed)
R ear See procedure

## 2017-03-02 ENCOUNTER — Ambulatory Visit (INDEPENDENT_AMBULATORY_CARE_PROVIDER_SITE_OTHER): Payer: 59 | Admitting: Pharmacist

## 2017-03-02 ENCOUNTER — Encounter: Payer: Self-pay | Admitting: Oncology

## 2017-03-02 ENCOUNTER — Ambulatory Visit (INDEPENDENT_AMBULATORY_CARE_PROVIDER_SITE_OTHER): Payer: 59 | Admitting: Oncology

## 2017-03-02 VITALS — BP 159/78 | HR 88 | Temp 97.8°F | Ht 65.0 in | Wt 128.2 lb

## 2017-03-02 DIAGNOSIS — Z86718 Personal history of other venous thrombosis and embolism: Secondary | ICD-10-CM

## 2017-03-02 DIAGNOSIS — D689 Coagulation defect, unspecified: Secondary | ICD-10-CM | POA: Diagnosis not present

## 2017-03-02 DIAGNOSIS — Z881 Allergy status to other antibiotic agents status: Secondary | ICD-10-CM

## 2017-03-02 DIAGNOSIS — Z981 Arthrodesis status: Secondary | ICD-10-CM

## 2017-03-02 DIAGNOSIS — Z8669 Personal history of other diseases of the nervous system and sense organs: Secondary | ICD-10-CM | POA: Diagnosis not present

## 2017-03-02 DIAGNOSIS — D539 Nutritional anemia, unspecified: Secondary | ICD-10-CM

## 2017-03-02 DIAGNOSIS — I742 Embolism and thrombosis of arteries of the upper extremities: Secondary | ICD-10-CM

## 2017-03-02 DIAGNOSIS — Z91030 Bee allergy status: Secondary | ICD-10-CM

## 2017-03-02 DIAGNOSIS — I10 Essential (primary) hypertension: Secondary | ICD-10-CM | POA: Diagnosis not present

## 2017-03-02 DIAGNOSIS — Z91013 Allergy to seafood: Secondary | ICD-10-CM

## 2017-03-02 DIAGNOSIS — Z85828 Personal history of other malignant neoplasm of skin: Secondary | ICD-10-CM | POA: Diagnosis not present

## 2017-03-02 DIAGNOSIS — R011 Cardiac murmur, unspecified: Secondary | ICD-10-CM | POA: Diagnosis not present

## 2017-03-02 DIAGNOSIS — Z7901 Long term (current) use of anticoagulants: Secondary | ICD-10-CM | POA: Diagnosis not present

## 2017-03-02 DIAGNOSIS — Z882 Allergy status to sulfonamides status: Secondary | ICD-10-CM

## 2017-03-02 DIAGNOSIS — Z91041 Radiographic dye allergy status: Secondary | ICD-10-CM

## 2017-03-02 DIAGNOSIS — Z880 Allergy status to penicillin: Secondary | ICD-10-CM

## 2017-03-02 DIAGNOSIS — Z886 Allergy status to analgesic agent status: Secondary | ICD-10-CM

## 2017-03-02 DIAGNOSIS — Z8679 Personal history of other diseases of the circulatory system: Secondary | ICD-10-CM

## 2017-03-02 DIAGNOSIS — Z79891 Long term (current) use of opiate analgesic: Secondary | ICD-10-CM

## 2017-03-02 DIAGNOSIS — Z8619 Personal history of other infectious and parasitic diseases: Secondary | ICD-10-CM

## 2017-03-02 DIAGNOSIS — Z885 Allergy status to narcotic agent status: Secondary | ICD-10-CM

## 2017-03-02 DIAGNOSIS — D6859 Other primary thrombophilia: Secondary | ICD-10-CM | POA: Diagnosis not present

## 2017-03-02 DIAGNOSIS — Z79899 Other long term (current) drug therapy: Secondary | ICD-10-CM

## 2017-03-02 LAB — POCT INR: INR: 2.3

## 2017-03-02 NOTE — Progress Notes (Signed)
Anticoagulation Management Tracy Peters is a 83 y.o. female who reports to the clinic for monitoring of warfarin treatment.    Indication: DVT , history of; thrombosis of right radial artery ([place]) [174.2], primary hypercoagulable state ([place]) [D68.59], long term use of oral anticoagulation.  Duration: indefinite Supervising physician: Murriel Hopper  Anticoagulation Clinic Visit History: Patient does not report signs/symptoms of bleeding or thromboembolism  Other recent changes: No diet, medications, lifestyle endorsed by the patient to me.  Anticoagulation Episode Summary    Current INR goal:   2.0-3.0  TTR:   69.2 % (5.8 y)  Next INR check:   04/27/2017  INR from last check:   2.30 (03/02/2017)  Weekly max warfarin dose:     Target end date:   Indefinite  INR check location:   [facility]  Preferred lab:     Send INR reminders to:      Indications   Chronic anticoagulation [Z79.01] DVT HX OF [Z86.718] 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HCC) [I74.2] Primary hypercoagulable state ([place]) [D68.59] [D68.59] Long term (current) use of anticoagulants [Z79.01] [Z79.01]       Comments:         Anticoagulation Care Providers    Provider Role Specialty Phone number   Annia Belt, MD Referring Oncology [phone]      Allergies  Allergen Reactions  . Bee Venom Anaphylaxis    Yellow jackets, wasps as well  . Ivp Dye [Iodinated Diagnostic Agents] Anaphylaxis  . Shellfish Allergy Anaphylaxis  . Amlodipine Besylate     REACTION: swelling  . Aspirin     REACTION: unspecified  . Atorvastatin     REACTION: diarrhea  . Cholestyramine     REACTION: mouth irritation  . Codeine Phosphate Nausea And Vomiting  . Demerol [Meperidine]     Severe Hallucination!!!!  . Diphenhydramine Hcl Other (See Comments)    hyperactive  . Doxycycline     REACTION: nausea  . Gentamicin Sulfate     REACTION: unspecified  . Meclizine Hcl     REACTION: more dizziness on it  .  Meperidine Hcl   . Metronidazole     REACTION: bad taste  . Moxifloxacin     REACTION: insomnia Able to use Cipro  . Sulfamethoxazole     Kidney problems  . Penicillins Swelling and Rash   Prior to Admission medications   Medication Sig Start Date End Date Taking? Authorizing Provider  acyclovir ointment (ZOVIRAX) 5 % Apply 1 application topically daily as needed (for outbreak). 07/12/14  Yes Plotnikov, Evie Lacks, MD  Cholecalciferol (VITAMIN D3) 50000 units CAPS Take 1 capsule by mouth every 14 (fourteen) days. 07/14/16  Yes Plotnikov, Evie Lacks, MD  diphenoxylate-atropine (LOMOTIL) 2.5-0.025 MG tablet Take 1 tablet by mouth 2 (two) times daily as needed for diarrhea or loose stools. 01/27/17  Yes Plotnikov, Evie Lacks, MD  docusate sodium 100 MG CAPS Take 100 mg by mouth 2 (two) times daily. 01/20/14  Yes Newman Pies, MD  EPINEPHrine 0.3 mg/0.3 mL IJ SOAJ injection As dirrected 01/27/17  Yes Plotnikov, Evie Lacks, MD  fexofenadine (ALLEGRA) 180 MG tablet Take 180 mg by mouth daily as needed for allergies.  05/28/11  Yes Plotnikov, Evie Lacks, MD  fish oil-omega-3 fatty acids 1000 MG capsule Take 2 g by mouth daily.    Yes [provider]  fluticasone (FLONASE) 50 MCG/ACT nasal spray Place 2 sprays into both nostrils daily. 01/27/17  Yes Plotnikov, Evie Lacks, MD  hydrochlorothiazide (MICROZIDE) 12.5 MG  capsule Take 1 capsule (12.5 mg total) by mouth daily. 01/27/17  Yes Plotnikov, Evie Lacks, MD  loperamide (IMODIUM A-D) 2 MG tablet Take 2 mg by mouth 4 (four) times daily as needed for diarrhea or loose stools. Will take one by mouth 25min before meals or two as needed.   Yes [provider]  losartan (COZAAR) 100 MG tablet Take 1 tablet (100 mg total) by mouth daily. 01/27/17  Yes Plotnikov, Evie Lacks, MD  nitrofurantoin, macrocrystal-monohydrate, (MACROBID) 100 MG capsule Take 1 capsule (100 mg total) by mouth 2 (two) times daily. 08/31/15  Yes Plotnikov, Evie Lacks, MD  Probiotic  Product (ALIGN) 4 MG CAPS Take by mouth daily.     Yes [provider]  traMADol (ULTRAM) 50 MG tablet Take 1-2 tablets (50-100 mg total) by mouth every 12 (twelve) hours as needed for moderate pain. 01/27/17  Yes Plotnikov, Evie Lacks, MD  triamcinolone ointment (KENALOG) 0.5 % Apply topically 4 (four) times daily. 07/13/15  Yes Plotnikov, Evie Lacks, MD  valACYclovir (VALTREX) 1000 MG tablet Take 1 tablet (1,000 mg total) by mouth 2 (two) times daily. 01/27/17  Yes Plotnikov, Evie Lacks, MD  warfarin (COUMADIN) 5 MG tablet Take 2 tablets on Mondays of every week. All other days--take 1&1/2 tablets. 01/27/17  Yes Plotnikov, Evie Lacks, MD  zolpidem (AMBIEN) 10 MG tablet 1 & 1/2 TABLETS (15MG ) BY MOUTH AT BEDTIME AS NEEDED FOR SLEEP, OK TOREPEAT IN 4HRS 01/27/17  Yes Plotnikov, Evie Lacks, MD   Past Medical History:  Diagnosis Date  . Adenomatous colon polyp   . Allergic rhinitis    uses FLonase nightly  . Anemia   . Arthritis    joint pain  . Blood transfusion   . Bronchitis    hx of-7-18yrs ago  . Carpal tunnel syndrome    numbness and tingling   . Cataracts, bilateral   . Chronic anticoagulation 03/21/2013  . Congenital absence of one kidney   . Diarrhea    takes Immodium daily as needed or Lomotil   . Diverticulosis   . GERD (gastroesophageal reflux disease)    mild  . Herpes    takes Valtrex daily  . History of colon polyps   . History of DVT (deep vein thrombosis) 1998   right arm  . History of staph infection   . Hyperlipidemia    Medical MD doesn't think its absolutely necessary  . Hypertension    takes Losartan daily  . IBS (irritable bowel syndrome)    takes Electronics engineer daily  . Insomnia    takes Ambien nightly as needed  . LBP (low back pain)   . Long term (current) use of anticoagulants    Dr. Beryle Beams  . Nocturia   . Osteoporosis   . Raynaud's disease   . 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 Winfield Cunas  . 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[place])  09/16/2011   Right digital artery 1998 idiopathic   . Urinary leakage   . UTI (urinary tract infection)    Social History   Social History  . Marital status: Married    Spouse name: N/A  . Number of children: 2  . Years of education: N/A   Occupational History  . Retired Retired   Social History Main Topics  . Smoking status: Never Smoker  . Smokeless tobacco: Never Used  . Alcohol use No  . Drug use: No  . Sexual activity: Not on file   Other Topics Concern  .  Not on file   Social History Narrative  . No narrative on file   Family History  Problem Relation Age of Onset  . Breast cancer Sister   . Kidney disease Sister 55       nephritis  . Heart disease Brother   . Osteoarthritis Other   . Parkinsonism Mother   . Ulcerative colitis Sister   . Colon cancer Neg Hx     ASSESSMENT Recent Results: The most recent result is correlated with 52.5 mg per week: Lab Results  Component Value Date   INR 2.30 03/02/2017   INR 2.90 01/12/2017   INR 2.80 11/17/2016   PROTIME 39.6 (H) 04/03/2015    Anticoagulation Dosing: INR as of 03/02/2017 and Previous Warfarin Dosing Information    INR Dt INR Goal Wkly Tot Sun Mon Tue Wed Thu Fri Sat   03/02/2017 2.30 2.0-3.0 52.5 mg 7.5 mg 7.5 mg 7.5 mg 7.5 mg 7.5 mg 7.5 mg 7.5 mg    Previous description   Take 1 & 1/2 tablets by mouth once-daily at [facility] each day. DR Azucena Freed PATIENT (route warfarin notes and list as authorizing provider for LOS & Follow-up, lab orders and warfarin prescriptions), do not list attending physician    Anticoagulation Warfarin Dose Instructions as of 03/02/2017      Total Sun Mon Tue Wed Thu Fri Sat   New Dose 52.5 mg 7.5 mg 7.5 mg 7.5 mg 7.5 mg 7.5 mg 7.5 mg 7.5 mg     (5 mg x 1.5)  (5 mg x 1.5)  (5 mg x 1.5)  (5 mg x 1.5)  (5 mg x 1.5)  (5 mg x 1.5)  (5 mg x 1.5)                         Description   Take 1 & 1/2 tablets by mouth once-daily at [facility] each day. DR Azucena Freed PATIENT (route  warfarin notes and list as authorizing provider for LOS & Follow-up, lab orders and warfarin prescriptions), do not list attending physician      INR today: Therapeutic  PLAN Weekly dose was unchanged.   Patient Instructions  Patient instructed to take medications as defined in the Anti-coagulation Track section of this encounter.  Patient instructed to take today's dose.  Patient instructed to take 1 & 1/2 tablets by mouth once-daily at [facility] each day Patient verbalized understanding of these instructions.     Patient advised to contact clinic or seek medical attention if signs/symptoms of bleeding or thromboembolism occur.  Patient verbalized understanding by repeating back information and was advised to contact me if further medication-related questions arise. Patient was also provided an information handout.  Follow-up Return in 8 weeks (on 04/27/2017) for Follow up INR at 1130h.  Pennie Banter, PharmD, CACP, CPP  15 minutes spent face-to-face with the patient during the encounter. 50% of time spent on education. 50% of time was spent on point of care fingerstick INR sample collection, processing, results determination, and documentation in [email or web address].

## 2017-03-02 NOTE — Patient Instructions (Signed)
Patient instructed to take medications as defined in the Anti-coagulation Track section of this encounter.  Patient instructed to take today's dose.  Patient instructed to take 1 & 1/2 tablets by mouth once-daily at [facility] each day Patient verbalized understanding of these instructions.

## 2017-03-02 NOTE — Patient Instructions (Addendum)
Point of care INR today Return visit 6 months Lab 1 week before visit

## 2017-03-02 NOTE — Progress Notes (Signed)
Hematology and Oncology Follow Up Visit  Tracy Peters [id number] December 06, 1933 83 y.o. 03/02/2017 10:54 AM   Principle Diagnosis: Encounter Diagnosis  Name Primary?  . 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HCC) Yes  Clinical summary: 83 year old woman I have followed for many years. She sustained an arterial embolus to an index finger of her right hand involving a right radial artery in 1999 which happened coincidentally with starting a Cox 2 inhibitor. She has been maintained on chronic Coumadin anticoagulation since that time and has had no subsequent thrombotic events.  She has known degenerative arthritis of the spine and has had 2 operations in the past. She tells me that her left knee is deteriorating and surgery also recommended on this in the near future.  She developed a cervical radiculopathysecondary to cervical stenosis and spondylolysis. She had successful surgery on her cervical spine with anterior cervical decompression and discectomy and a fusion at 3 levels by Dr. Newman Pies on January 18, 2014.  She had residual paresthesias in some of her fingers of the right hand. She was reevaluated by Dr. Arnoldo Morale and found to have carpal tunnel syndrome. She underwent a another surgical procedure on 06/12/2014. She has had significant improvement of the paresthesias now limited just to the middle finger.  Interim History:   Overall doing well. No recurrent neurologic symptoms. On her problem list that said that she had a lower extremity DVT back in March. I can find absolutely no documentation for this either in the imaging section or in the progress notes from her primary internist. She denies having had any new thrombotic event. She continues on chronic warfarin anticoagulation for a very remote digital artery thrombosis. She is up-to-date on her health maintenance exams. She is at no interim medical problems. She and her husband just bought a new boat and along with their son, sailed  from [place] to [place].   Medications:   Current Outpatient Prescriptions:  .  acyclovir ointment (ZOVIRAX) 5 %, Apply 1 application topically daily as needed (for outbreak)., Disp: 15 g, Rfl: 2 .  Cholecalciferol (VITAMIN D3) 50000 units CAPS, Take 1 capsule by mouth every 14 (fourteen) days., Disp: 6 capsule, Rfl: 15 .  diphenoxylate-atropine (LOMOTIL) 2.5-0.025 MG tablet, Take 1 tablet by mouth 2 (two) times daily as needed for diarrhea or loose stools., Disp: 30 tablet, Rfl: 2 .  docusate sodium 100 MG CAPS, Take 100 mg by mouth 2 (two) times daily., Disp: 60 capsule, Rfl: 0 .  EPINEPHrine 0.3 mg/0.3 mL IJ SOAJ injection, As dirrected, Disp: 1 Device, Rfl: 3 .  fexofenadine (ALLEGRA) 180 MG tablet, Take 180 mg by mouth daily as needed for allergies. , Disp: , Rfl:  .  fish oil-omega-3 fatty acids 1000 MG capsule, Take 2 g by mouth daily. , Disp: , Rfl:  .  fluticasone (FLONASE) 50 MCG/ACT nasal spray, Place 2 sprays into both nostrils daily., Disp: 58 g, Rfl: 3 .  hydrochlorothiazide (MICROZIDE) 12.5 MG capsule, Take 1 capsule (12.5 mg total) by mouth daily., Disp: 90 capsule, Rfl: 3 .  loperamide (IMODIUM A-D) 2 MG tablet, Take 2 mg by mouth 4 (four) times daily as needed for diarrhea or loose stools. Will take one by mouth 60min before meals or two as needed., Disp: , Rfl:  .  losartan (COZAAR) 100 MG tablet, Take 1 tablet (100 mg total) by mouth daily., Disp: 90 tablet, Rfl: 3 .  nitrofurantoin, macrocrystal-monohydrate, (MACROBID) 100 MG capsule, Take 1 capsule (100 mg total)  by mouth 2 (two) times daily., Disp: 20 capsule, Rfl: 0 .  Probiotic Product (ALIGN) 4 MG CAPS, Take by mouth daily.  , Disp: , Rfl:  .  traMADol (ULTRAM) 50 MG tablet, Take 1-2 tablets (50-100 mg total) by mouth every 12 (twelve) hours as needed for moderate pain., Disp: 360 tablet, Rfl: 1 .  triamcinolone ointment (KENALOG) 0.5 %, Apply topically 4 (four) times daily., Disp: 30 g, Rfl: 3 .  valACYclovir  (VALTREX) 1000 MG tablet, Take 1 tablet (1,000 mg total) by mouth 2 (two) times daily., Disp: 180 tablet, Rfl: 3 .  warfarin (COUMADIN) 5 MG tablet, Take 2 tablets on Mondays of every week. All other days--take 1&1/2 tablets., Disp: 135 tablet, Rfl: 3 .  zolpidem (AMBIEN) 10 MG tablet, 1 & 1/2 TABLETS (15MG ) BY MOUTH AT BEDTIME AS NEEDED FOR SLEEP, OK TOREPEAT IN 4HRS, Disp: 135 tablet, Rfl: 1 Allergies:  Allergies  Allergen Reactions  . Bee Venom Anaphylaxis    Yellow jackets, wasps as well  . Ivp Dye [Iodinated Diagnostic Agents] Anaphylaxis  . Shellfish Allergy Anaphylaxis  . Amlodipine Besylate     REACTION: swelling  . Aspirin     REACTION: unspecified  . Atorvastatin     REACTION: diarrhea  . Cholestyramine     REACTION: mouth irritation  . Codeine Phosphate Nausea And Vomiting  . Demerol [Meperidine]     Severe Hallucination!!!!  . Diphenhydramine Hcl Other (See Comments)    hyperactive  . Doxycycline     REACTION: nausea  . Gentamicin Sulfate     REACTION: unspecified  . Meclizine Hcl     REACTION: more dizziness on it  . Meperidine Hcl   . Metronidazole     REACTION: bad taste  . Moxifloxacin     REACTION: insomnia Able to use Cipro  . Sulfamethoxazole     Kidney problems  . Penicillins Swelling and Rash    Review of Systems: See interim history Remaining ROS negative:   Physical Exam: Blood pressure (!) 159/78, pulse 88, temperature 97.8 F (36.6 C), weight 128 lb 3.2 oz (58.2 kg), SpO2 99 %. Wt Readings from Last 3 Encounters:  03/02/17 128 lb 3.2 oz (58.2 kg)  01/27/17 125 lb (56.7 kg)  08/18/16 124 lb 11.2 oz (56.6 kg)     General appearance: Well-nourished Caucasian woman HENNT: Pharynx no erythema, exudate, mass, or ulcer. No thyromegaly or thyroid nodules Lymph nodes: No cervical, supraclavicular, or axillary lymphadenopathy Breasts: No abnormal skin changes, no dominant mass in either breast Lungs: Clear to auscultation, resonant to percussion  throughout Heart: Regular rhythm, 2/6 aortic systolic murmur, no gallop, no rub, no click, no edema Abdomen: Soft, nontender, normal bowel sounds, no mass, no organomegaly Extremities: No edema, no calf tenderness Musculoskeletal: no joint deformities GU:  Vascular: Carotid pulses 2+, no bruits, radial pulses 2+ symmetric, ulnar pulses 1+ symmetric  Neurologic: Alert, oriented, PERRLA, optic discs sharp and vessels normal, no hemorrhage or exudate, cranial nerves grossly normal, motor strength 5 over 5, reflexes 1+ symmetric, upper body coordination normal, gait normal, Skin: No rash or ecchymosis  Lab Results: CBC W/Diff    Component Value Date/Time   WBC 7.8 08/18/2016 1148   WBC 9.8 01/03/2016 1043   RBC 3.43 (L) 08/18/2016 1148   RBC 3.35 (L) 01/03/2016 1043   HGB 12.6 08/18/2016 1148   HGB 12.7 03/21/2014 1058   HCT 36.9 08/18/2016 1148   HCT 38.7 03/21/2014 1058   PLT 375 08/18/2016 1148  MCV 108 (H) 08/18/2016 1148   MCV 110.4 (H) 03/21/2014 1058   MCH 36.7 (H) 08/18/2016 1148   MCH 36.3 (H) 08/01/2014 1119   MCHC 34.1 08/18/2016 1148   MCHC 33.4 01/03/2016 1043   RDW 14.7 08/18/2016 1148   RDW 13.3 03/21/2014 1058   LYMPHSABS 2.5 08/18/2016 1148   LYMPHSABS 1.6 03/21/2014 1058   MONOABS 0.8 01/03/2016 1043   MONOABS 0.6 03/21/2014 1058   EOSABS 0.1 08/18/2016 1148   BASOSABS 0.0 08/18/2016 1148   BASOSABS 0.0 03/21/2014 1058     Chemistry      Component Value Date/Time   NA 125 (L) 09/29/2016 1135   NA 136 08/18/2016 1148   NA 132 (L) 03/21/2014 1100   K 3.9 09/29/2016 1135   K 4.9 03/21/2014 1100   CL 90 (L) 09/29/2016 1135   CL 97 (L) 03/16/2012 1344   CO2 27 09/29/2016 1135   CO2 26 03/21/2014 1100   BUN 17 09/29/2016 1135   BUN 21 08/18/2016 1148   BUN 16.0 03/21/2014 1100   CREATININE 0.81 09/29/2016 1135   CREATININE 0.9 03/21/2014 1100      Component Value Date/Time   CALCIUM 9.5 09/29/2016 1135   CALCIUM 9.9 03/21/2014 1100   ALKPHOS 57  08/18/2016 1148   ALKPHOS 51 03/21/2014 1100   AST 17 08/18/2016 1148   AST 22 03/21/2014 1100   ALT 14 08/18/2016 1148   ALT 15 03/21/2014 1100   BILITOT 0.5 08/18/2016 1148   BILITOT 0.36 03/21/2014 1100       Radiological Studies: No results found.  Impression:  #1. 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 No subsequent thrombotic events on long-term Coumadin anticoagulation. Plan continue the same.  #2. Degenerative arthritis of the spine status post cervical discectomy with fusion procedure and plate stabilization C4 through C7 August 2015  #3. Carpal tunnel syndrome status post right carpal tunnel repair in December 2015  #4. Chronic mild macrocytic anemia with normal H20 and folic acid levels  #5. Essential hypertension  #6. Irritable bowel syndrome  #7. History of genital herpes  #8. 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 Initial biopsy 02/27/2015.   CC: Patient Care Team: Plotnikov, Evie Lacks, MD as PCP - General Justice Britain, MD (Orthopedic Surgery) Annia Belt, MD (Hematology and Oncology) Molli Posey, MD (Obstetrics and Gynecology) Lafayette Dragon, MD (Inactive) (Gastroenterology) Newman Pies, MD as Consulting Physician (Neurosurgery) Gaynelle Arabian, MD as Consulting Physician (Orthopedic Surgery) Newman Pies, MD as Consulting Physician (Neurosurgery)   Murriel Hopper, MD, [place]  Hematology-Oncology/Internal Medicine     9/17/201810:54 AM

## 2017-03-02 NOTE — Progress Notes (Signed)
Reviewed thx DrG 

## 2017-03-09 ENCOUNTER — Ambulatory Visit: Payer: 59

## 2017-03-13 DIAGNOSIS — M25551 Pain in right hip: Secondary | ICD-10-CM | POA: Diagnosis not present

## 2017-03-18 DIAGNOSIS — M25551 Pain in right hip: Secondary | ICD-10-CM | POA: Diagnosis not present

## 2017-03-21 DIAGNOSIS — M25551 Pain in right hip: Secondary | ICD-10-CM | POA: Diagnosis not present

## 2017-04-01 DIAGNOSIS — M7061 Trochanteric bursitis, right hip: Secondary | ICD-10-CM | POA: Diagnosis not present

## 2017-04-01 DIAGNOSIS — M25551 Pain in right hip: Secondary | ICD-10-CM | POA: Diagnosis not present

## 2017-04-01 DIAGNOSIS — S7001XD Contusion of right hip, subsequent encounter: Secondary | ICD-10-CM | POA: Diagnosis not present

## 2017-04-07 DIAGNOSIS — Z01419 Encounter for gynecological examination (general) (routine) without abnormal findings: Secondary | ICD-10-CM | POA: Diagnosis not present

## 2017-04-07 DIAGNOSIS — Z6822 Body mass index (BMI) 22.0-22.9, adult: Secondary | ICD-10-CM | POA: Diagnosis not present

## 2017-04-07 DIAGNOSIS — Z1231 Encounter for screening mammogram for malignant neoplasm of breast: Secondary | ICD-10-CM | POA: Diagnosis not present

## 2017-04-15 ENCOUNTER — Ambulatory Visit (INDEPENDENT_AMBULATORY_CARE_PROVIDER_SITE_OTHER): Payer: 59

## 2017-04-15 DIAGNOSIS — Z23 Encounter for immunization: Secondary | ICD-10-CM

## 2017-04-22 DIAGNOSIS — S7001XD Contusion of right hip, subsequent encounter: Secondary | ICD-10-CM | POA: Diagnosis not present

## 2017-04-22 DIAGNOSIS — S76011D Strain of muscle, fascia and tendon of right hip, subsequent encounter: Secondary | ICD-10-CM | POA: Diagnosis not present

## 2017-04-22 DIAGNOSIS — M25551 Pain in right hip: Secondary | ICD-10-CM | POA: Diagnosis not present

## 2017-04-22 DIAGNOSIS — M7061 Trochanteric bursitis, right hip: Secondary | ICD-10-CM | POA: Diagnosis not present

## 2017-04-27 ENCOUNTER — Ambulatory Visit (INDEPENDENT_AMBULATORY_CARE_PROVIDER_SITE_OTHER): Payer: 59

## 2017-04-27 DIAGNOSIS — D6859 Other primary thrombophilia: Secondary | ICD-10-CM

## 2017-04-27 DIAGNOSIS — Z7901 Long term (current) use of anticoagulants: Secondary | ICD-10-CM | POA: Diagnosis not present

## 2017-04-27 DIAGNOSIS — Z86718 Personal history of other venous thrombosis and embolism: Secondary | ICD-10-CM | POA: Diagnosis not present

## 2017-04-27 DIAGNOSIS — I742 Embolism and thrombosis of arteries of the upper extremities: Secondary | ICD-10-CM

## 2017-04-27 LAB — POCT INR: INR: 1.7

## 2017-04-27 NOTE — Progress Notes (Signed)
Reviewed and agree thx Karsten Fells

## 2017-04-27 NOTE — Progress Notes (Signed)
Anticoagulation Management Tracy Peters is a 83 y.o. female who reports to the clinic for monitoring of warfarin treatment.    Indication: Primary hypercoagulable state and histor of venous thrombosis and embolism.  Duration: indefinite Supervising physician: Murriel Hopper  Anticoagulation Clinic Visit History: Patient does not report signs/symptoms of bleeding or thromboembolism. Tracy Peters reports falling recently but denies hitting her head. She has had some bruising, but otherwise denies any bleeding. She will also be scheduling an appointment soon for a hip replacement and will follow up with the clinic on the plans in order to be seen to address her anticoagulation plan.  Other recent changes: She denies any diet, medications, or lifestyle changes. Anticoagulation Episode Summary    Current INR goal:   2.0-3.0  TTR:   68.8 % (6 y)  Next INR check:   05/25/2017  INR from last check:   1.7! (04/27/2017)  Weekly max warfarin dose:     Target end date:   Indefinite  INR check location:   [facility]  Preferred lab:     Send INR reminders to:      Indications   Chronic anticoagulation [Z79.01] DVT HX OF [Z86.718] 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HCC) [I74.2] Primary hypercoagulable state ([place]) [D68.59] [D68.59] Long term (current) use of anticoagulants [Z79.01] [Z79.01]       Comments:         Anticoagulation Care Providers    Provider Role Specialty Phone number   Annia Belt, MD Referring Oncology [phone]      Allergies  Allergen Reactions  . Bee Venom Anaphylaxis    Yellow jackets, wasps as well  . Ivp Dye [Iodinated Diagnostic Agents] Anaphylaxis  . Shellfish Allergy Anaphylaxis  . Amlodipine Besylate     REACTION: swelling  . Aspirin     REACTION: unspecified  . Atorvastatin     REACTION: diarrhea  . Cholestyramine     REACTION: mouth irritation  . Codeine Phosphate Nausea And Vomiting  . Demerol [Meperidine]     Severe  Hallucination!!!!  . Diphenhydramine Hcl Other (See Comments)    hyperactive  . Doxycycline     REACTION: nausea  . Gentamicin Sulfate     REACTION: unspecified  . Meclizine Hcl     REACTION: more dizziness on it  . Meperidine Hcl   . Metronidazole     REACTION: bad taste  . Moxifloxacin     REACTION: insomnia Able to use Cipro  . Sulfamethoxazole     Kidney problems  . Penicillins Swelling and Rash   Prior to Admission medications   Medication Sig Start Date End Date Taking? Authorizing Provider  acyclovir ointment (ZOVIRAX) 5 % Apply 1 application topically daily as needed (for outbreak). 07/12/14   Plotnikov, Evie Lacks, MD  Cholecalciferol (VITAMIN D3) 50000 units CAPS Take 1 capsule by mouth every 14 (fourteen) days. 07/14/16   Plotnikov, Evie Lacks, MD  diphenoxylate-atropine (LOMOTIL) 2.5-0.025 MG tablet Take 1 tablet by mouth 2 (two) times daily as needed for diarrhea or loose stools. 01/27/17   Plotnikov, Evie Lacks, MD  docusate sodium 100 MG CAPS Take 100 mg by mouth 2 (two) times daily. 01/20/14   Newman Pies, MD  EPINEPHrine 0.3 mg/0.3 mL IJ SOAJ injection As dirrected 01/27/17   Plotnikov, Evie Lacks, MD  fexofenadine (ALLEGRA) 180 MG tablet Take 180 mg by mouth daily as needed for allergies.  05/28/11   Plotnikov, Evie Lacks, MD  fish oil-omega-3 fatty acids 1000 MG capsule Take 2  g by mouth daily.     [provider]  fluticasone (FLONASE) 50 MCG/ACT nasal spray Place 2 sprays into both nostrils daily. 01/27/17   Plotnikov, Evie Lacks, MD  hydrochlorothiazide (MICROZIDE) 12.5 MG capsule Take 1 capsule (12.5 mg total) by mouth daily. 01/27/17   Plotnikov, Evie Lacks, MD  loperamide (IMODIUM A-D) 2 MG tablet Take 2 mg by mouth 4 (four) times daily as needed for diarrhea or loose stools. Will take one by mouth 41min before meals or two as needed.    [provider]  losartan (COZAAR) 100 MG tablet Take 1 tablet (100 mg total) by mouth daily. 01/27/17   Plotnikov,  Evie Lacks, MD  nitrofurantoin, macrocrystal-monohydrate, (MACROBID) 100 MG capsule Take 1 capsule (100 mg total) by mouth 2 (two) times daily. 08/31/15   Plotnikov, Evie Lacks, MD  Probiotic Product (ALIGN) 4 MG CAPS Take by mouth daily.      [provider]  traMADol (ULTRAM) 50 MG tablet Take 1-2 tablets (50-100 mg total) by mouth every 12 (twelve) hours as needed for moderate pain. 01/27/17   Plotnikov, Evie Lacks, MD  triamcinolone ointment (KENALOG) 0.5 % Apply topically 4 (four) times daily. 07/13/15   Plotnikov, Evie Lacks, MD  valACYclovir (VALTREX) 1000 MG tablet Take 1 tablet (1,000 mg total) by mouth 2 (two) times daily. 01/27/17   Plotnikov, Evie Lacks, MD  warfarin (COUMADIN) 5 MG tablet Take 2 tablets on Mondays of every week. All other days--take 1&1/2 tablets. 01/27/17   Plotnikov, Evie Lacks, MD  zolpidem (AMBIEN) 10 MG tablet 1 & 1/2 TABLETS (15MG ) BY MOUTH AT BEDTIME AS NEEDED FOR SLEEP, OK TOREPEAT IN 4HRS 01/27/17   Plotnikov, Evie Lacks, MD   Past Medical History:  Diagnosis Date  . Adenomatous colon polyp   . Allergic rhinitis    uses FLonase nightly  . Anemia   . Arthritis    joint pain  . Blood transfusion   . Bronchitis    hx of-7-68yrs ago  . Carpal tunnel syndrome    numbness and tingling   . Cataracts, bilateral   . Chronic anticoagulation 03/21/2013  . Congenital absence of one kidney   . Diarrhea    takes Immodium daily as needed or Lomotil   . Diverticulosis   . GERD (gastroesophageal reflux disease)    mild  . Herpes    takes Valtrex daily  . History of colon polyps   . History of DVT (deep vein thrombosis) 1998   right arm  . History of staph infection   . Hyperlipidemia    Medical MD doesn't think its absolutely necessary  . Hypertension    takes Losartan daily  . IBS (irritable bowel syndrome)    takes Electronics engineer daily  . Insomnia    takes Ambien nightly as needed  . LBP (low back pain)   . Long term (current) use of anticoagulants    Dr.  Beryle Beams  . Nocturia   . Osteoporosis   . Raynaud's disease   . 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 Winfield Cunas  . 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 Urinary leakage   . UTI (urinary tract infection)    Social History   Socioeconomic History  . Marital status: Married    Spouse name: Not on file  . Number of children: 2  . Years of education: Not on file  . Highest education level: Not on  file  Social Needs  . Financial resource strain: Not on file  . Food insecurity - worry: Not on file  . Food insecurity - inability: Not on file  . Transportation needs - medical: Not on file  . Transportation needs - non-medical: Not on file  Occupational History  . Occupation: Retired    Fish farm manager: RETIRED  Tobacco Use  . Smoking status: Never Smoker  . Smokeless tobacco: Never Used  Substance and Sexual Activity  . Alcohol use: No    Alcohol/week: 0.0 oz  . Drug use: No  . Sexual activity: Not on file  Other Topics Concern  . Not on file  Social History Narrative  . Not on file   Family History  Problem Relation Age of Onset  . Breast cancer Sister   . Kidney disease Sister 51       nephritis  . Heart disease Brother   . Osteoarthritis Other   . Parkinsonism Mother   . Ulcerative colitis Sister   . Colon cancer Neg Hx     ASSESSMENT Recent Results: The most recent result is correlated with 52.5 mg per week: Lab Results  Component Value Date   INR 1.7 04/27/2017   INR 2.30 03/02/2017   INR 2.90 01/12/2017   PROTIME 39.6 (H) 04/03/2015    Anticoagulation Dosing: Description   Take 1 & 1/2 tablets by mouth once-daily at 6PM each day except on Monday and Friday when you will take 2 whole tablets of the 5 mg Peach colored tablets. DR Azucena Freed PATIENT (route warfarin notes and list as authorizing provider for LOS & Follow-up, lab orders and warfarin prescriptions), do not list attending  physician      INR today: Subtherapeutic  PLAN Weekly dose was increased by 10% to 57.5 mg per week  Patient Instructions  Patient instructed to take medications as defined in the Anti-coagulation Track section of this encounter.  Patient instructed to take today's dose.  Patient was instructed to take 1 & 1/2 tablets by mouth once-daily at [facility] each day except on Monday and Friday when you will take 2 whole tablets of the 5 mg Peach colored tablets. Patient verbalized understanding of these instructions.     Patient advised to contact clinic or seek medical attention if signs/symptoms of bleeding or thromboembolism occur.  Patient verbalized understanding by repeating back information and was advised to contact me if further medication-related questions arise. Patient was also provided an information handout.  Follow-up Return in about 4 weeks (around 05/25/2017) for INR follow up at 1130.  Patterson Hammersmith PharmD PGY1 Pharmacy Practice Resident 04/27/2017 11:48 AM  30 minutes spent face-to-face with the patient during the encounter. 25% of time spent on education. 75% of time was spent on discussion about her falls/hip replacement, collection of blood for INR testing, interpretation of results, discussion of results/plan with the patient, and documentation into [email or web address] and EPIC.

## 2017-04-27 NOTE — Patient Instructions (Signed)
Patient instructed to take medications as defined in the Anti-coagulation Track section of this encounter.  Patient instructed to take today's dose.  Patient was instructed to take 1 & 1/2 tablets by mouth once-daily at [facility] each day except on Monday and Friday when you will take 2 whole tablets of the 5 mg Peach colored tablets. Patient verbalized understanding of these instructions.

## 2017-05-12 ENCOUNTER — Encounter (HOSPITAL_COMMUNITY): Payer: Self-pay | Admitting: *Deleted

## 2017-05-18 ENCOUNTER — Encounter: Payer: Self-pay | Admitting: Internal Medicine

## 2017-05-18 ENCOUNTER — Ambulatory Visit (INDEPENDENT_AMBULATORY_CARE_PROVIDER_SITE_OTHER): Payer: 59 | Admitting: Internal Medicine

## 2017-05-18 DIAGNOSIS — G4700 Insomnia, unspecified: Secondary | ICD-10-CM | POA: Diagnosis not present

## 2017-05-18 DIAGNOSIS — G8929 Other chronic pain: Secondary | ICD-10-CM | POA: Diagnosis not present

## 2017-05-18 DIAGNOSIS — M5440 Lumbago with sciatica, unspecified side: Secondary | ICD-10-CM | POA: Diagnosis not present

## 2017-05-18 DIAGNOSIS — R197 Diarrhea, unspecified: Secondary | ICD-10-CM | POA: Diagnosis not present

## 2017-05-18 DIAGNOSIS — Z7901 Long term (current) use of anticoagulants: Secondary | ICD-10-CM

## 2017-05-18 DIAGNOSIS — Z01818 Encounter for other preprocedural examination: Secondary | ICD-10-CM

## 2017-05-18 DIAGNOSIS — D6859 Other primary thrombophilia: Secondary | ICD-10-CM | POA: Diagnosis not present

## 2017-05-18 MED ORDER — VITAMIN D3 1.25 MG (50000 UT) PO CAPS
1.0000 | ORAL_CAPSULE | ORAL | 15 refills | Status: DC
Start: 2017-05-18 — End: 2017-10-20

## 2017-05-18 NOTE — Progress Notes (Signed)
Subjective:  Patient ID: Tracy Peters, female    DOB: [birth year]  Age: 83 y.o. MRN: [id number]  CC: No chief complaint on file.   HPI Tracy Peters presents for surgical clearance Req by Dr Alvan Dame Reason: pre-op clearance for R hip open bursectomy and gluteal tendon repair Hx:R hip pain is getting worse  >6 mo; anticoagulation, OA and allergies - stable  Past Medical History:  Diagnosis Date  . Adenomatous colon polyp   . Allergic rhinitis    uses FLonase nightly  . Anemia   . Arthritis    joint pain  . Blood transfusion   . Bronchitis    hx of-7-71yrs ago  . Carpal tunnel syndrome    numbness and tingling   . Cataracts, bilateral   . Chronic anticoagulation 03/21/2013  . Congenital absence of one kidney   . Diarrhea    takes Immodium daily as needed or Lomotil   . Diverticulosis   . GERD (gastroesophageal reflux disease)    mild  . Herpes    takes Valtrex daily  . History of colon polyps   . History of DVT (deep vein thrombosis) 1998   right arm  . History of staph infection   . Hyperlipidemia    Medical MD doesn't think its absolutely necessary  . Hypertension    takes Losartan daily  . IBS (irritable bowel syndrome)    takes Electronics engineer daily  . Insomnia    takes Ambien nightly as needed  . LBP (low back pain)   . Long term (current) use of anticoagulants    Dr. Beryle Beams  . Nocturia   . Osteoporosis   . Raynaud's disease   . 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 Winfield Cunas  . 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 Urinary leakage   . UTI (urinary tract infection)    Past Surgical History:  Procedure Laterality Date  . ABDOMINAL HYSTERECTOMY    . ANTERIOR CERVICAL DECOMP/DISCECTOMY FUSION N/A 01/18/2014   Procedure: ANTERIOR CERVICAL DECOMPRESSION/DISCECTOMY FUSION 3 LEVELS  Cervical  four/five, five/six, six/seven anterior cervical decompression with fusion interbody prosthesis with plating and  bonegraft;  Surgeon: Newman Pies, MD;  Location: [place] [facility];  Service: Neurosurgery;  Laterality: N/A;  t  . APPENDECTOMY    . BACK SURGERY     2  . BREAST SURGERY     cystectomy-benign  . CARPAL TUNNEL RELEASE Right 06/12/2014   Procedure: CARPAL TUNNEL RELEASE;  Surgeon: Newman Pies, MD;  Location: [place] [facility];  Service: Neurosurgery;  Laterality: Right;  Right Carpal Tunnel Release  . CATARACT EXTRACTION Bilateral 02/15/2015  . CHOLECYSTECTOMY    . COLONOSCOPY    . I&D of abdomen  1988   couple of wks after gallbladder removed  . KNEE SURGERY     left x 3  . LUMBAR LAMINECTOMY     X 2  . mortons neuromas removed    . SHOULDER SURGERY     Right    reports that  has never smoked. she has never used smokeless tobacco. She reports that she does not drink alcohol or use drugs. family history includes Breast cancer in her sister; Heart disease in her brother; Kidney disease (age of onset: 76) in her sister; Osteoarthritis in her other; Parkinsonism in her mother; Ulcerative colitis in her sister. Allergies  Allergen Reactions  . Bee Venom Anaphylaxis    Yellow jackets, wasps  as well  . Ivp Dye [Iodinated Diagnostic Agents] Anaphylaxis  . Shellfish Allergy Anaphylaxis  . Amlodipine Besylate     REACTION: swelling  . Aspirin     REACTION: unspecified  . Atorvastatin     REACTION: diarrhea  . Cholestyramine     REACTION: mouth irritation  . Codeine Phosphate Nausea And Vomiting  . Demerol [Meperidine]     Severe Hallucination!!!!  . Diphenhydramine Hcl Other (See Comments)    hyperactive  . Doxycycline     REACTION: nausea  . Gentamicin Sulfate     REACTION: unspecified  . Meclizine Hcl     REACTION: more dizziness on it  . Meperidine Hcl   . Metronidazole     REACTION: bad taste  . Moxifloxacin     REACTION: insomnia Able to use Cipro  . Sulfamethoxazole     Kidney problems  . Penicillins Swelling and Rash     Outpatient Medications Prior to Visit    Medication Sig Dispense Refill  . acyclovir ointment (ZOVIRAX) 5 % Apply 1 application topically daily as needed (for outbreak). 15 g 2  . Cholecalciferol (VITAMIN D3) 50000 units CAPS Take 1 capsule by mouth every 14 (fourteen) days. 6 capsule 15  . diphenoxylate-atropine (LOMOTIL) 2.5-0.025 MG tablet Take 1 tablet by mouth 2 (two) times daily as needed for diarrhea or loose stools. 30 tablet 2  . docusate sodium 100 MG CAPS Take 100 mg by mouth 2 (two) times daily. 60 capsule 0  . EPINEPHrine 0.3 mg/0.3 mL IJ SOAJ injection As dirrected 1 Device 3  . fexofenadine (ALLEGRA) 180 MG tablet Take 180 mg by mouth daily as needed for allergies.     . fish oil-omega-3 fatty acids 1000 MG capsule Take 2 g by mouth daily.     . fluticasone (FLONASE) 50 MCG/ACT nasal spray Place 2 sprays into both nostrils daily. 58 g 3  . hydrochlorothiazide (MICROZIDE) 12.5 MG capsule Take 1 capsule (12.5 mg total) by mouth daily. 90 capsule 3  . loperamide (IMODIUM A-D) 2 MG tablet Take 2 mg by mouth 4 (four) times daily as needed for diarrhea or loose stools. Will take one by mouth 62min before meals or two as needed.    Marland Kitchen losartan (COZAAR) 100 MG tablet Take 1 tablet (100 mg total) by mouth daily. 90 tablet 3  . nitrofurantoin, macrocrystal-monohydrate, (MACROBID) 100 MG capsule Take 1 capsule (100 mg total) by mouth 2 (two) times daily. 20 capsule 0  . Probiotic Product (ALIGN) 4 MG CAPS Take by mouth daily.      . traMADol (ULTRAM) 50 MG tablet Take 1-2 tablets (50-100 mg total) by mouth every 12 (twelve) hours as needed for moderate pain. 360 tablet 1  . triamcinolone ointment (KENALOG) 0.5 % Apply topically 4 (four) times daily. 30 g 3  . valACYclovir (VALTREX) 1000 MG tablet Take 1 tablet (1,000 mg total) by mouth 2 (two) times daily. 180 tablet 3  . warfarin (COUMADIN) 5 MG tablet Take 2 tablets on Mondays of every week. All other days--take 1&1/2 tablets. 135 tablet 3  . zolpidem (AMBIEN) 10 MG tablet 1 & 1/2  TABLETS (15MG ) BY MOUTH AT BEDTIME AS NEEDED FOR SLEEP, OK TOREPEAT IN 4HRS 135 tablet 1   No facility-administered medications prior to visit.     ROS Review of Systems  Constitutional: Negative for activity change, appetite change, chills, fatigue and unexpected weight change.  HENT: Negative for congestion, mouth sores and sinus pressure.   Eyes:  Negative for visual disturbance.  Respiratory: Negative for cough and chest tightness.   Gastrointestinal: Negative for abdominal pain and nausea.  Genitourinary: Negative for difficulty urinating, frequency and vaginal pain.  Musculoskeletal: Positive for arthralgias, back pain and gait problem.  Skin: Negative for pallor and rash.  Neurological: Positive for weakness. Negative for dizziness, tremors, numbness and headaches.  Psychiatric/Behavioral: Negative for confusion and sleep disturbance.    Objective:  BP (!) 142/78 (BP Location: Left Arm, Patient Position: Sitting, Cuff Size: Normal)   Pulse 90   Temp 97.8 F (36.6 C) (Oral)   Ht 5\' 5"  (1.651 m)   Wt 126 lb (57.2 kg)   SpO2 98%   BMI 20.97 kg/m   BP Readings from Last 3 Encounters:  05/18/17 (!) 142/78  03/02/17 (!) 159/78  01/27/17 (!) 150/86    Wt Readings from Last 3 Encounters:  05/18/17 126 lb (57.2 kg)  03/02/17 128 lb 3.2 oz (58.2 kg)  01/27/17 125 lb (56.7 kg)    Physical Exam  Constitutional: She appears well-developed. No distress.  HENT:  Head: Normocephalic.  Right Ear: External ear normal.  Left Ear: External ear normal.  Nose: Nose normal.  Mouth/Throat: Oropharynx is clear and moist.  Eyes: Conjunctivae are normal. Pupils are equal, round, and reactive to light. Right eye exhibits no discharge. Left eye exhibits no discharge.  Neck: Normal range of motion. Neck supple. No JVD present. No tracheal deviation present. No thyromegaly present.  Cardiovascular: Normal rate, regular rhythm and normal heart sounds.  Pulmonary/Chest: No stridor. No  respiratory distress. She has no wheezes.  Abdominal: Soft. Bowel sounds are normal. She exhibits no distension and no mass. There is no tenderness. There is no rebound and no guarding.  Musculoskeletal: She exhibits tenderness. She exhibits no edema.  Lymphadenopathy:    She has no cervical adenopathy.  Neurological: She displays normal reflexes. No cranial nerve deficit. She exhibits normal muscle tone. Coordination abnormal.  Skin: No rash noted. No erythema.  Psychiatric: She has a normal mood and affect. Her behavior is normal. Judgment and thought content normal.  R hip painful, R glut is tender [place]  Lab Results  Component Value Date   WBC 7.8 08/18/2016   HGB 12.6 08/18/2016   HCT 36.9 08/18/2016   PLT 375 08/18/2016   GLUCOSE 84 09/29/2016   CHOL 196 01/03/2016   TRIG 85.0 01/03/2016   HDL 63.10 01/03/2016   LDLDIRECT 129.8 05/31/2012   LDLCALC 116 (H) 01/03/2016   ALT 14 08/18/2016   AST 17 08/18/2016   NA 125 (L) 09/29/2016   K 3.9 09/29/2016   CL 90 (L) 09/29/2016   CREATININE 0.81 09/29/2016   BUN 17 09/29/2016   CO2 27 09/29/2016   TSH 2.69 01/03/2016   INR 1.7 04/27/2017    Ct Head Wo Contrast  Result Date: 09/30/2016 CLINICAL DATA:  [place] 1 week ago.  Evaluate for hemorrhage. EXAM: CT HEAD WITHOUT CONTRAST TECHNIQUE: Contiguous axial images were obtained from the base of the skull through the vertex without intravenous contrast. COMPARISON:  CT head 06/15/2008. FINDINGS: Brain: No evidence for acute infarction, hemorrhage, mass lesion, hydrocephalus, or extra-axial fluid. Generalized atrophy. Chronic microvascular ischemic change. Vascular: No hyperdense vessel or unexpected calcification. Skull: Normal. Negative for fracture or focal lesion. Sinuses/Orbits: No acute finding. Other: None. Compared with priors, similar appearance. IMPRESSION: Atrophy and small vessel disease. No skull fracture or intracranial hemorrhage. Electronically Signed   By: Staci Righter  M.D.   On: 09/30/2016  17:37    Assessment & Plan:   There are no diagnoses linked to this encounter. I am having [place]. [place] maintain her ALIGN, loperamide, fish oil-omega-3 fatty acids, fexofenadine, DSS, acyclovir ointment, triamcinolone ointment, nitrofurantoin (macrocrystal-monohydrate), Vitamin D3, diphenoxylate-atropine, EPINEPHrine, fluticasone, hydrochlorothiazide, losartan, traMADol, valACYclovir, zolpidem, and warfarin.  No orders of the defined types were placed in this encounter.    Follow-up: No Follow-up on file.  [name], MD

## 2017-05-20 ENCOUNTER — Encounter: Payer: Self-pay | Admitting: Internal Medicine

## 2017-05-20 DIAGNOSIS — Z01818 Encounter for other preprocedural examination: Secondary | ICD-10-CM | POA: Insufficient documentation

## 2017-05-20 NOTE — Assessment & Plan Note (Signed)
The patient is medically clear for R hip open bursectomy and gluteal tendon repair. She is to see Dr Beryle Beams too. Her risk for perioperative complications is average. Thank you!

## 2017-05-20 NOTE — Assessment & Plan Note (Signed)
On Coumadin Appt w/Dr Beryle Beams

## 2017-05-20 NOTE — Assessment & Plan Note (Signed)
Lomotil prn 

## 2017-05-20 NOTE — Assessment & Plan Note (Signed)
On Coumadin 

## 2017-05-20 NOTE — Assessment & Plan Note (Signed)
Zolpidem 15 mg/repeat in 4 h prn - pt has to use higher #  x years  Potential benefits of a long term benzodiazepines  use as well as potential risks  and complications were explained to the patient and were aknowledged.

## 2017-05-20 NOTE — Assessment & Plan Note (Signed)
Tramadol prn ° Potential benefits of a long term opioids use as well as potential risks (i.e. addiction risk, apnea etc) and complications (i.e. Somnolence, constipation and others) were explained to the patient and were aknowledged. ° ° °

## 2017-05-21 DIAGNOSIS — L821 Other seborrheic keratosis: Secondary | ICD-10-CM | POA: Diagnosis not present

## 2017-05-24 ENCOUNTER — Encounter: Payer: Self-pay | Admitting: Oncology

## 2017-05-25 ENCOUNTER — Ambulatory Visit: Payer: 59

## 2017-05-25 ENCOUNTER — Encounter: Payer: 59 | Admitting: Oncology

## 2017-05-26 ENCOUNTER — Telehealth: Payer: Self-pay | Admitting: Pharmacist

## 2017-05-26 NOTE — Progress Notes (Addendum)
05-18-17 Surgical clearance from Dr. Alain Marion pending follow-up with Dr. Beryle Beams. Pt has an appt with Dr. Beryle Beams on 05-27-17 at 1:00 PM  05-27-17 (Epic) Clearance from Dr. Beryle Beams

## 2017-05-26 NOTE — Telephone Encounter (Signed)
Patient texted me regarding upcoming planned orthopedic procedure for Monday December 17th, 2018 by Dr. Paralee Cancel. Patient was instructed to come to [facility] Wednesday December 12th, 2018 at 1:00PM for INR determination before seeing Dr. Beryle Beams (who has agreed to see patient as an add-on) at 1:15PM. Called patient and advised the same.

## 2017-05-27 ENCOUNTER — Ambulatory Visit (INDEPENDENT_AMBULATORY_CARE_PROVIDER_SITE_OTHER): Payer: 59 | Admitting: Oncology

## 2017-05-27 ENCOUNTER — Other Ambulatory Visit: Payer: Self-pay

## 2017-05-27 ENCOUNTER — Encounter: Payer: Self-pay | Admitting: Oncology

## 2017-05-27 ENCOUNTER — Other Ambulatory Visit: Payer: 59

## 2017-05-27 ENCOUNTER — Encounter (HOSPITAL_COMMUNITY): Payer: Self-pay

## 2017-05-27 ENCOUNTER — Encounter (HOSPITAL_COMMUNITY)
Admission: RE | Admit: 2017-05-27 | Discharge: 2017-05-27 | Disposition: A | Discharge status: Home / Self Care | Payer: 59 | Source: Ambulatory Visit | Attending: Orthopedic Surgery | Admitting: Orthopedic Surgery

## 2017-05-27 VITALS — BP 168/95 | HR 80 | Temp 98.1°F | Ht 65.0 in | Wt 124.3 lb

## 2017-05-27 DIAGNOSIS — I10 Essential (primary) hypertension: Secondary | ICD-10-CM

## 2017-05-27 DIAGNOSIS — Z882 Allergy status to sulfonamides status: Secondary | ICD-10-CM

## 2017-05-27 DIAGNOSIS — Z91030 Bee allergy status: Secondary | ICD-10-CM | POA: Diagnosis not present

## 2017-05-27 DIAGNOSIS — R9431 Abnormal electrocardiogram [ECG] [EKG]: Secondary | ICD-10-CM | POA: Diagnosis not present

## 2017-05-27 DIAGNOSIS — Z888 Allergy status to other drugs, medicaments and biological substances status: Secondary | ICD-10-CM

## 2017-05-27 DIAGNOSIS — M25551 Pain in right hip: Secondary | ICD-10-CM | POA: Diagnosis not present

## 2017-05-27 DIAGNOSIS — Z01812 Encounter for preprocedural laboratory examination: Secondary | ICD-10-CM | POA: Diagnosis not present

## 2017-05-27 DIAGNOSIS — M71551 Other bursitis, not elsewhere classified, right hip: Secondary | ICD-10-CM | POA: Diagnosis not present

## 2017-05-27 DIAGNOSIS — Z91041 Radiographic dye allergy status: Secondary | ICD-10-CM

## 2017-05-27 DIAGNOSIS — I742 Embolism and thrombosis of arteries of the upper extremities: Secondary | ICD-10-CM

## 2017-05-27 DIAGNOSIS — Z7901 Long term (current) use of anticoagulants: Secondary | ICD-10-CM

## 2017-05-27 DIAGNOSIS — D539 Nutritional anemia, unspecified: Secondary | ICD-10-CM | POA: Diagnosis not present

## 2017-05-27 DIAGNOSIS — X58XXXA Exposure to other specified factors, initial encounter: Secondary | ICD-10-CM | POA: Diagnosis not present

## 2017-05-27 DIAGNOSIS — Z881 Allergy status to other antibiotic agents status: Secondary | ICD-10-CM | POA: Diagnosis not present

## 2017-05-27 DIAGNOSIS — Z885 Allergy status to narcotic agent status: Secondary | ICD-10-CM

## 2017-05-27 DIAGNOSIS — D6859 Other primary thrombophilia: Secondary | ICD-10-CM

## 2017-05-27 DIAGNOSIS — Z91013 Allergy to seafood: Secondary | ICD-10-CM

## 2017-05-27 DIAGNOSIS — Z886 Allergy status to analgesic agent status: Secondary | ICD-10-CM

## 2017-05-27 DIAGNOSIS — Z01810 Encounter for preprocedural cardiovascular examination: Secondary | ICD-10-CM | POA: Insufficient documentation

## 2017-05-27 DIAGNOSIS — S76312A Strain of muscle, fascia and tendon of the posterior muscle group at thigh level, left thigh, initial encounter: Secondary | ICD-10-CM | POA: Insufficient documentation

## 2017-05-27 DIAGNOSIS — Z880 Allergy status to penicillin: Secondary | ICD-10-CM

## 2017-05-27 LAB — SURGICAL PCR SCREEN
MRSA, PCR: NEGATIVE
Staphylococcus aureus: NEGATIVE

## 2017-05-27 LAB — BASIC METABOLIC PANEL
ANION GAP: 8 (ref 5–15)
BUN: 17 mg/dL (ref 6–20)
CALCIUM: 9.9 mg/dL (ref 8.9–10.3)
CO2: 24 mmol/L (ref 22–32)
Chloride: 101 mmol/L (ref 101–111)
Creatinine, Ser: 0.82 mg/dL (ref 0.44–1.00)
GFR calc Af Amer: >60 mL/min (ref >60)
GLUCOSE: 97 mg/dL (ref 65–99)
Potassium: 4.9 mmol/L (ref 3.5–5.1)
SODIUM: 133 mmol/L — AB (ref 135–145)

## 2017-05-27 LAB — CBC
HCT: 33.6 % — ABNORMAL LOW (ref 36.0–46.0)
Hemoglobin: 10.9 g/dL — ABNORMAL LOW (ref 12.0–15.0)
MCH: 32.3 pg (ref 26.0–34.0)
MCHC: 32.4 g/dL (ref 30.0–36.0)
MCV: 99.7 fL (ref 78.0–100.0)
PLATELETS: 335 10*3/uL (ref 150–400)
RBC: 3.37 MIL/uL — AB (ref 3.87–5.11)
RDW: 14.3 % (ref 11.5–15.5)
WBC: 7.2 10*3/uL (ref 4.0–10.5)

## 2017-05-27 LAB — POCT INR: INR: 2.5

## 2017-05-27 LAB — ABO/RH: ABO/RH(D): AB POS

## 2017-05-27 MED ORDER — ENOXAPARIN SODIUM 40 MG/0.4ML ~~LOC~~ SOLN: 80.0000 mg | SUBCUTANEOUS | 1 refills | Status: DC

## 2017-05-27 NOTE — Progress Notes (Signed)
Hematology and Oncology Follow Up Visit  Tracy Peters [id number] 09/10/1933 83 y.o. 05/27/2017 3:28 PM   Principle Diagnosis: Encounter Diagnoses  Name Primary?  . Chronic anticoagulation Yes  . Primary hypercoagulable state ([place]) [D68.59]   . 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 Macrocytic anemia      Interim History:   Work in visit today for this 83 year old woman I have followed for many years.  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  She is scheduled for surgery on Monday  December 17.  Procedure is a right open bursectomy and attempt at repair of a gluteal tendon. Surgeon request advice on perioperative anticoagulation. She has no other active medical concerns at this time and had a recent follow-up visit with her primary care physician.  Medications:  Current Outpatient Medications:  .  acyclovir ointment (ZOVIRAX) 5 %, Apply 1 application topically daily as needed (for outbreak). (Patient not taking: Reported on 05/20/2017), Disp: 15 g, Rfl: 2 .  Cholecalciferol (VITAMIN D3) 50000 units CAPS, Take 1 capsule by mouth every 14 (fourteen) days. (Patient not taking: Reported on 05/27/2017), Disp: 6 capsule, Rfl: 15 .  diphenoxylate-atropine (LOMOTIL) 2.5-0.025 MG tablet, Take 1 tablet by mouth 2 (two) times daily as needed for diarrhea or loose stools., Disp: 30 tablet, Rfl: 2 .  docusate sodium 100 MG CAPS, Take 100 mg by mouth 2 (two) times daily. (Patient taking differently: Take 100 mg by mouth 2 (two) times daily as needed (for constipation). ), Disp: 60 capsule, Rfl: 0 .  [START ON 06/03/2017] enoxaparin (LOVENOX) 40 MG/0.4ML injection, Inject 0.8 mLs (80 mg total) into the skin daily for 4 days., Disp: 5 Syringe, Rfl: 1 .  EPINEPHrine 0.3 mg/0.3 mL IJ SOAJ injection, As dirrected (Patient taking differently: Inject 0.3 mg into the muscle once. ), Disp: 1 Device, Rfl: 3 .  fish oil-omega-3 fatty  acids 1000 MG capsule, Take 2 g by mouth daily. , Disp: , Rfl:  .  fluticasone (FLONASE) 50 MCG/ACT nasal spray, Place 2 sprays into both nostrils daily. (Patient taking differently: Place 1 spray into both nostrils every evening. ), Disp: 58 g, Rfl: 3 .  hydrochlorothiazide (MICROZIDE) 12.5 MG capsule, Take 1 capsule (12.5 mg total) by mouth daily. (Patient not taking: Reported on 05/20/2017), Disp: 90 capsule, Rfl: 3 .  loperamide (IMODIUM A-D) 2 MG tablet, Take 2 mg by mouth 4 (four) times daily as needed for diarrhea or loose stools. Will take one by mouth 51min before meals or two as needed., Disp: , Rfl:  .  losartan (COZAAR) 100 MG tablet, Take 1 tablet (100 mg total) by mouth daily., Disp: 90 tablet, Rfl: 3 .  traMADol (ULTRAM) 50 MG tablet, Take 1-2 tablets (50-100 mg total) by mouth every 12 (twelve) hours as needed for moderate pain., Disp: 360 tablet, Rfl: 1 .  triamcinolone ointment (KENALOG) 0.5 %, Apply topically 4 (four) times daily. (Patient not taking: Reported on 05/20/2017), Disp: 30 g, Rfl: 3 .  valACYclovir (VALTREX) 1000 MG tablet, Take 1 tablet (1,000 mg total) by mouth 2 (two) times daily., Disp: 180 tablet, Rfl: 3 .  warfarin (COUMADIN) 5 MG tablet, Take 2 tablets on Mondays of every week. All other days--take 1&1/2 tablets. (Patient taking differently: Take 7.5-10 mg by mouth See admin instructions. Take 10 mg by mouth daily on Monday and Friday. Take 7.5 mg by mouth daily on all other days),  Disp: 135 tablet, Rfl: 3 .  zolpidem (AMBIEN) 10 MG tablet, 1 & 1/2 TABLETS (15MG ) BY MOUTH AT BEDTIME AS NEEDED FOR SLEEP, OK TOREPEAT IN 4HRS (Patient taking differently: Take 10-15 mg by mouth at bedtime as needed for sleep (May take additional 10 mg if needed). ), Disp: 135 tablet, Rfl: 1  Allergies:  Allergies  Allergen Reactions  . Bee Venom Anaphylaxis and Other (See Comments)    Yellow jackets, wasps as well  . Ivp Dye [Iodinated Diagnostic Agents] Anaphylaxis  . Shellfish  Allergy Anaphylaxis  . Amlodipine Besylate Swelling  . Aspirin Other (See Comments)    REACTION: unspecified  . Atorvastatin Diarrhea  . Cholestyramine Other (See Comments)    REACTION: mouth irritation  . Codeine Phosphate Nausea And Vomiting  . Demerol [Meperidine] Other (See Comments)    Severe Hallucination!!!!  . Diphenhydramine Hcl Other (See Comments)    hyperactive  . Doxycycline Nausea Only  . Gentamicin Sulfate Other (See Comments)    REACTION: unspecified  . Meclizine Hcl Other (See Comments)    REACTION: more dizziness on it  . Meperidine Hcl Other (See Comments)    Unknown  . Metronidazole Other (See Comments)    REACTION: bad taste  . Moxifloxacin Other (See Comments)    REACTION: insomnia Able to use Cipro  . Sulfamethoxazole Other (See Comments)    Kidney problems  . Penicillins Swelling, Rash and Other (See Comments)    Has patient had a PCN reaction causing immediate rash, facial/tongue/throat swelling, SOB or lightheadedness with hypotension: Yes Has patient had a PCN reaction causing severe rash involving mucus membranes or skin necrosis: No Has patient had a PCN reaction that required hospitalization: No Has patient had a PCN reaction occurring within the last 10 years: No If all of the above answers are "NO", then may proceed with Cephalosporin use.     Review of Systems: Remarkable for increasing pain in her right hip. Remaining ROS negative:   Physical Exam: Blood pressure (!) 168/95, pulse 80, temperature 98.1 F (36.7 C), temperature source Oral, height 5\' 5"  (1.651 m), weight 124 lb 4.8 oz (56.4 kg), SpO2 99 %. Wt Readings from Last 3 Encounters:  05/27/17 124 lb 4.8 oz (56.4 kg)  05/27/17 124 lb 4 oz (56.4 kg)  05/18/17 126 lb (57.2 kg)     General appearance: Well-nourished Caucasian woman HENNT: Pharynx no erythema, exudate, mass, or ulcer. No thyromegaly or thyroid nodules Lymph nodes: No cervical, supraclavicular, or axillary  lymphadenopathy Breasts: Lungs: Clear to auscultation, resonant to percussion throughout Heart: Regular rhythm, no murmur, no gallop, no rub, no click, no edema Abdomen: Soft, nontender, normal bowel sounds, no mass, no organomegaly Extremities: No edema, no calf tenderness Musculoskeletal: no joint deformities, no cyanosis GU:  Vascular: Carotid pulses 2+, no bruits, radial pulse 2+ symmetric, ulnar pulses 1+ symmetric, distal pulses: Dorsalis pedis 1+ symmetric Neurologic: Alert, oriented, PERRLA, optic discs sharp and vessels normal, no hemorrhage or exudate, cranial nerves grossly normal, motor strength 5 over 5, reflexes 1+ symmetric, upper body coordination normal, gait normal, Skin: No rash or ecchymosis  Lab Results: CBC W/Diff    Component Value Date/Time   WBC 7.2 05/27/2017 1130   RBC 3.37 (L) 05/27/2017 1130   HGB 10.9 (L) 05/27/2017 1130   HGB 12.6 08/18/2016 1148   HGB 12.7 03/21/2014 1058   HCT 33.6 (L) 05/27/2017 1130   HCT 36.9 08/18/2016 1148   HCT 38.7 03/21/2014 1058   PLT 335 05/27/2017 1130  PLT 375 08/18/2016 1148   MCV 99.7 05/27/2017 1130   MCV 108 (H) 08/18/2016 1148   MCV 110.4 (H) 03/21/2014 1058   MCH 32.3 05/27/2017 1130   MCHC 32.4 05/27/2017 1130   RDW 14.3 05/27/2017 1130   RDW 14.7 08/18/2016 1148   RDW 13.3 03/21/2014 1058   LYMPHSABS 2.5 08/18/2016 1148   LYMPHSABS 1.6 03/21/2014 1058   MONOABS 0.8 01/03/2016 1043   MONOABS 0.6 03/21/2014 1058   EOSABS 0.1 08/18/2016 1148   BASOSABS 0.0 08/18/2016 1148   BASOSABS 0.0 03/21/2014 1058     Chemistry      Component Value Date/Time   NA 133 (L) 05/27/2017 1130   NA 136 08/18/2016 1148   NA 132 (L) 03/21/2014 1100   K 4.9 05/27/2017 1130   K 4.9 03/21/2014 1100   CL 101 05/27/2017 1130   CL 97 (L) 03/16/2012 1344   CO2 24 05/27/2017 1130   CO2 26 03/21/2014 1100   BUN 17 05/27/2017 1130   BUN 21 08/18/2016 1148   BUN 16.0 03/21/2014 1100   CREATININE 0.82 05/27/2017 1130    CREATININE 0.9 03/21/2014 1100      Component Value Date/Time   CALCIUM 9.9 05/27/2017 1130   CALCIUM 9.9 03/21/2014 1100   ALKPHOS 57 08/18/2016 1148   ALKPHOS 51 03/21/2014 1100   AST 17 08/18/2016 1148   AST 22 03/21/2014 1100   ALT 14 08/18/2016 1148   ALT 15 03/21/2014 1100   BILITOT 0.5 08/18/2016 1148   BILITOT 0.36 03/21/2014 1100     INR: 2.5 on current Coumadin dose 10 mg Mondays and Thursdays, 7.5 mg on the other days of the week  Radiological Studies: No results found.  Impression: 1.  Chronic anticoagulation secondary to remote arterial embolus Her INR is mid therapeutic range today.  I have advised her to hold her warfarin for just 3 days prior to surgery.  By day 4, the day of surgery, she should be subtherapeutic and be able to undergo her surgery. She should resume the warfarin at her home dose the night of the surgery. Begin a Lovenox bridge in the hospital.  1.5 mg/kg/day total dose 80 mg subcutaneous daily to begin 24 hours postop and continue as an outpatient until warfarin is again therapeutic. Surgical team to arrange PT/INR on postop day 4, Friday, December 21.  If she is still not therapeutic then continue the Lovenox over the weekend and check INR again on Monday, December 24. Printed instructions given to the patient, copy of instructions for the surgeon and the preop nursing staff.  2.  Essential hypertension. Initial blood pressure today was 187/84.  Repeat after the patient settled down was 168/95.  I will any leave adjustments in her antihypertensives to her primary care physician.  3.  Fall in hemoglobin from baseline.  Chronic macrocytosis without initial anemia.  Hemoglobin 13.5 in February 2016.  12.6 in March of this year.  Current value down to 10.9. I will evaluate this further next month when she recovers from her surgery.  There may be a dietary component.  She states she just has not been eating as much lately.  I will check a methylmalonic  acid and a folic acid level prior to her visit here in January. I told her I anticipate she may lose blood during the surgery and might require a blood transfusion.   CC: Patient Care Team: Plotnikov, Evie Lacks, MD as PCP - Kela Millin, MD (Orthopedic Surgery)  Annia Belt, MD (Hematology and Oncology) Molli Posey, MD (Obstetrics and Gynecology) Lafayette Dragon, MD (Inactive) (Gastroenterology) Newman Pies, MD as Consulting Physician (Neurosurgery) Gaynelle Arabian, MD as Consulting Physician (Orthopedic Surgery) Newman Pies, MD as Consulting Physician (Neurosurgery) Paralee Cancel, MD as Consulting Physician (Orthopedic Surgery)   Murriel Hopper, MD, [place]  Hematology-Oncology/Internal Medicine     12/12/20183:28 PM

## 2017-05-27 NOTE — Patient Instructions (Signed)
Stop coumadin on Friday 12/14. Resume on night of surgery. Start lovenox 80 mg by injection under skin day after surgery and continue until coumadin therapeutic. Have your surgeon arrange for PT/INR on Friday 12/21. If coumadin not therapeutic, continue lovenox over the weekend and repeat PT/INR on Monday 12/24 Please schedule visit with Dr Darnell Level for January. CBC, PT/INR on return

## 2017-05-27 NOTE — Patient Instructions (Addendum)
Tracy Peters  05/27/2017   Your procedure is scheduled on: 06-01-17   Report to [facility] Main  Entrance  Report to Admitting at 1:30 PM   Call this number if you have problems the morning of surgery  [phone]   Remember: ONLY 1 PERSON MAY GO WITH YOU TO SHORT STAY TO GET  READY MORNING OF YOUR SURGERY.  Do not eat food or drink liquids :After Midnight. You may have a Clear Liquid Diet from midnight until 10:00 AM. After 10:00 AM, nothing until after surgery.    CLEAR LIQUID DIET   Foods Allowed                                                                     Foods Excluded  Coffee and tea, regular and decaf                             liquids that you cannot  Plain Jell-O in any flavor                                             see through such as: Fruit ices (not with fruit pulp)                                     milk, soups, orange juice  Iced Popsicles                                    All solid food Carbonated beverages, regular and diet                                    Cranberry, grape and apple juices Sports drinks like Gatorade Lightly seasoned clear broth or consume(fat free) Sugar, honey syrup  Sample Menu Breakfast                                Lunch                                     Supper Cranberry juice                    Beef broth                            Chicken broth Jell-O                                     Grape juice  Apple juice Coffee or tea                        Jell-O                                      Popsicle                                                Coffee or tea                        Coffee or tea  _____________________________________________________________________     Take these medicines the morning of surgery with A SIP OF WATER: None                                You may not have any metal on your body including hair pins and              piercings  Do not  wear jewelry, make-up, lotions, powders or perfumes, deodorant             Do not wear nail polish.  Do not shave  48 hours prior to surgery.                Do not bring valuables to the hospital. [place].  Contacts, dentures or bridgework may not be worn into surgery.  Leave suitcase in the car. After surgery it may be brought to your room.                Please read over the following fact sheets you were given: _____________________________________________________________________             [facility] - Preparing for Surgery Before surgery, you can play an important role.  Because skin is not sterile, your skin needs to be as free of germs as possible.  You can reduce the number of germs on your skin by washing with CHG (chlorahexidine gluconate) soap before surgery.  CHG is an antiseptic cleaner which kills germs and bonds with the skin to continue killing germs even after washing. Please DO NOT use if you have an allergy to CHG or antibacterial soaps.  If your skin becomes reddened/irritated stop using the CHG and inform your nurse when you arrive at Short Stay. Do not shave (including legs and underarms) for at least 48 hours prior to the first CHG shower.  You may shave your face/neck. Please follow these instructions carefully:  1.  Shower with CHG Soap the night before surgery and the  morning of Surgery.  2.  If you choose to wash your hair, wash your hair first as usual with your  normal  shampoo.  3.  After you shampoo, rinse your hair and body thoroughly to remove the  shampoo.                           4.  Use CHG as you would any other liquid soap.  You can apply chg directly  to the skin and wash                       Gently with a scrungie or clean washcloth.  5.  Apply the CHG Soap to your body ONLY FROM THE NECK DOWN.   Do not use on face/ open                           Wound or open sores. Avoid contact with eyes, ears  mouth and genitals (private parts).                       Wash face,  Genitals (private parts) with your normal soap.             6.  Wash thoroughly, paying special attention to the area where your surgery  will be performed.  7.  Thoroughly rinse your body with warm water from the neck down.  8.  DO NOT shower/wash with your normal soap after using and rinsing off  the CHG Soap.                9.  Pat yourself dry with a clean towel.            10.  Wear clean pajamas.            11.  Place clean sheets on your bed the night of your first shower and do not  sleep with pets. Day of Surgery : Do not apply any lotions/deodorants the morning of surgery.  Please wear clean clothes to the hospital/surgery center.  FAILURE TO FOLLOW THESE INSTRUCTIONS MAY RESULT IN THE CANCELLATION OF YOUR SURGERY PATIENT SIGNATURE_________________________________  NURSE SIGNATURE__________________________________  ________________________________________________________________________   Tracy Peters  An incentive spirometer is a tool that can help keep your lungs clear and active. This tool measures how well you are filling your lungs with each breath. Taking long deep breaths may help reverse or decrease the chance of developing breathing (pulmonary) problems (especially infection) following:  A long period of time when you are unable to move or be active. BEFORE THE PROCEDURE   If the spirometer includes an indicator to show your best effort, your nurse or respiratory therapist will set it to a desired goal.  If possible, sit up straight or lean slightly forward. Try not to slouch.  Hold the incentive spirometer in an upright position. INSTRUCTIONS FOR USE  1. Sit on the edge of your bed if possible, or sit up as far as you can in bed or on a chair. 2. Hold the incentive spirometer in an upright position. 3. Breathe out normally. 4. Place the mouthpiece in your mouth and seal your lips  tightly around it. 5. Breathe in slowly and as deeply as possible, raising the piston or the ball toward the top of the column. 6. Hold your breath for 3-5 seconds or for as long as possible. Allow the piston or ball to fall to the bottom of the column. 7. Remove the mouthpiece from your mouth and breathe out normally. 8. Rest for a few seconds and repeat Steps 1 through 7 at least 10 times every 1-2 hours when you are awake. Take your time and take a few normal breaths between deep breaths. 9. The spirometer may include an indicator to show your best effort. Use the indicator as a goal to work toward during each repetition. 10. After  each set of 10 deep breaths, practice coughing to be sure your lungs are clear. If you have an incision (the cut made at the time of surgery), support your incision when coughing by placing a pillow or rolled up towels firmly against it. Once you are able to get out of bed, walk around indoors and cough well. You may stop using the incentive spirometer when instructed by your caregiver.  RISKS AND COMPLICATIONS  Take your time so you do not get dizzy or light-headed.  If you are in pain, you may need to take or ask for pain medication before doing incentive spirometry. It is harder to take a deep breath if you are having pain. AFTER USE  Rest and breathe slowly and easily.  It can be helpful to keep track of a log of your progress. Your caregiver can provide you with a simple table to help with this. If you are using the spirometer at home, follow these instructions: [place] IF:   You are having difficultly using the spirometer.  You have trouble using the spirometer as often as instructed.  Your pain medication is not giving enough relief while using the spirometer.  You develop fever of 100.5 F (38.1 C) or higher. SEEK IMMEDIATE MEDICAL CARE IF:   You cough up bloody sputum that had not been present before.  You develop fever of 102 F  (38.9 C) or greater.  You develop worsening pain at or near the incision site. MAKE SURE YOU:   Understand these instructions.  Will watch your condition.  Will get help right away if you are not doing well or get worse. Document Released: 10/13/2006 Document Revised: 08/25/2011 Document Reviewed: 12/14/2006 ExitCare Patient Information 2014 ExitCare, [place].   ________________________________________________________________________  WHAT IS A BLOOD TRANSFUSION? Blood Transfusion Information  A transfusion is the replacement of blood or some of its parts. Blood is made up of multiple cells which provide different functions.  Red blood cells carry oxygen and are used for blood loss replacement.  White blood cells fight against infection.  Platelets control bleeding.  Plasma helps clot blood.  Other blood products are available for specialized needs, such as hemophilia or other clotting disorders. BEFORE THE TRANSFUSION  Who gives blood for transfusions?   Healthy volunteers who are fully evaluated to make sure their blood is safe. This is blood bank blood. Transfusion therapy is the safest it has ever been in the practice of medicine. Before blood is taken from a donor, a complete history is taken to make sure that person has no history of diseases nor engages in risky social behavior (examples are intravenous drug use or sexual activity with multiple partners). The donor's travel history is screened to minimize risk of transmitting infections, such as malaria. The donated blood is tested for signs of infectious diseases, such as HIV and hepatitis. The blood is then tested to be sure it is compatible with you in order to minimize the chance of a transfusion reaction. If you or a relative donates blood, this is often done in anticipation of surgery and is not appropriate for emergency situations. It takes many days to process the donated blood. RISKS AND COMPLICATIONS Although  transfusion therapy is very safe and saves many lives, the main dangers of transfusion include:   Getting an infectious disease.  Developing a transfusion reaction. This is an allergic reaction to something in the blood you were given. Every precaution is taken to prevent this. The decision to  have a blood transfusion has been considered carefully by your caregiver before blood is given. Blood is not given unless the benefits outweigh the risks. AFTER THE TRANSFUSION  Right after receiving a blood transfusion, you will usually feel much better and more energetic. This is especially true if your red blood cells have gotten low (anemic). The transfusion raises the level of the red blood cells which carry oxygen, and this usually causes an energy increase.  The nurse administering the transfusion will monitor you carefully for complications. HOME CARE INSTRUCTIONS  No special instructions are needed after a transfusion. You may find your energy is better. Speak with your caregiver about any limitations on activity for underlying diseases you may have. SEEK MEDICAL CARE IF:   Your condition is not improving after your transfusion.  You develop redness or irritation at the intravenous (IV) site. SEEK IMMEDIATE MEDICAL CARE IF:  Any of the following symptoms occur over the next 12 hours:  Shaking chills.  You have a temperature by mouth above 102 F (38.9 C), not controlled by medicine.  Chest, back, or muscle pain.  People around you feel you are not acting correctly or are confused.  Shortness of breath or difficulty breathing.  Dizziness and fainting.  You get a rash or develop hives.  You have a decrease in urine output.  Your urine turns a dark color or changes to pink, red, or brown. Any of the following symptoms occur over the next 10 days:  You have a temperature by mouth above 102 F (38.9 C), not controlled by medicine.  Shortness of breath.  Weakness after normal  activity.  The white part of the eye turns yellow (jaundice).  You have a decrease in the amount of urine or are urinating less often.  Your urine turns a dark color or changes to pink, red, or brown. Document Released: 05/30/2000 Document Revised: 08/25/2011 Document Reviewed: 01/17/2008 [facility] Patient Information 2014 [place], [place].  _______________________________________________________________________               Wynell Balloon  06/04/2017   Your procedure is scheduled on:   Report to [place]  Entrance Take [facility]  elevators to 3rd floor to  [place] at AM.  Report to admitting at AM Follow signs to Short Stay on first floor at AM  Call this number if you have problems the morning of surgery [phone]   Remember: ONLY 1 PERSON MAY GO WITH YOU TO SHORT STAY TO GET  [place].  Do not eat food or drink liquids :After Midnight.     Take these medicines the morning of surgery with A SIP OF WATER:  DO NOT TAKE ANY DIABETIC MEDICATIONS DAY OF YOUR SURGERY                               You may not have any metal on your body including hair pins and              piercings  Do not wear jewelry, make-up, lotions, powders or perfumes, deodorant             Do not wear nail polish.  Do not shave  48 hours prior to surgery.              Men may shave face and neck.   Do not bring valuables to the hospital. [facility] IS NOT             RESPONSIBLE   FOR VALUABLES.  Contacts, dentures or bridgework may not be worn into surgery.  Leave suitcase in the car. After surgery it may be brought to your room.     Patients discharged the day of surgery will not be allowed to drive home.  Name and phone number of your driver:  Special Instructions: N/A              Please read over the following fact sheets you were given: _____________________________________________________________________

## 2017-05-29 NOTE — Progress Notes (Addendum)
LVM on home phone and cell phone  with time change for surgery. Pt. To arrive at 1115 am to admitting and clears till 87.

## 2017-05-29 NOTE — Progress Notes (Signed)
PT. AWARE OF TIME CHANGE SPOKE WITH HER VIA PHONE. ARRIVE AT 1115 AM AND CLEARS UNTIL 0745 AM THEN NOTHING BY MOUTH.

## 2017-05-29 NOTE — Progress Notes (Signed)
Tried to call and confirm pt. Got my message about time change. No answer.

## 2017-05-31 ENCOUNTER — Inpatient Hospital Stay (HOSPITAL_COMMUNITY): Payer: 59 | Admitting: Certified Registered Nurse Anesthetist

## 2017-05-31 NOTE — H&P (Signed)
Tracy Peters is an 83 y.o. female.    Chief Complaint: Right hip persistent bursitis and gluteus medius/minimus tears  Procedure:  Right hip open bursectomy and attempted repair of gluteal tendon  HPI: Pt is a 83 y.o. female complaining of right hip pain for 1.5+ years. Pain had continually increased since the beginning.   Pt has tried various conservative treatments which have failed to alleviate their symptoms, including analgesic medications, activity modification and assistance devices. Various options are discussed with the patient. Risks, benefits and expectations were discussed with the patient. Patient understand the risks, benefits and expectations and wishes to proceed with surgery.    PCP: Plotnikov, Evie Lacks, MD  D/C Plans:       Home   Post-op Meds:       No Rx given   Tranexamic Acid:      To be given -   Topically if given  Decadron:      Is to be given  FYI:     Coumadin - start night of surgery at regular dose  Lovenox bridge - 80 qd starting 24 hrs after surgery  Norco  DME:  Rx given for - RW    PMH: Past Medical History:  Diagnosis Date  . Adenomatous colon polyp   . Allergic rhinitis    uses FLonase nightly  . Anemia   . Arthritis    joint pain  . Blood transfusion   . Bronchitis    hx of-7-74yrs ago  . Carpal tunnel syndrome    numbness and tingling   . Cataracts, bilateral   . Chronic anticoagulation 03/21/2013  . Congenital absence of one kidney   . Diarrhea    takes Immodium daily as needed or Lomotil   . Diverticulosis   . GERD (gastroesophageal reflux disease)    mild  . Herpes    takes Valtrex daily  . History of colon polyps   . History of DVT (deep vein thrombosis) 1998   right arm  . History of staph infection   . Hyperlipidemia    Medical MD doesn't think its absolutely necessary  . Hypertension    takes Losartan daily  . IBS (irritable bowel syndrome)    takes Electronics engineer daily  . Insomnia    takes Ambien nightly as needed    . LBP (low back pain)   . Long term (current) use of anticoagulants    Dr. Beryle Beams  . Nocturia   . Osteoporosis   . Raynaud's disease   . 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 Winfield Cunas  . 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 Urinary leakage   . UTI (urinary tract infection)     PSH: Past Surgical History:  Procedure Laterality Date  . ABDOMINAL HYSTERECTOMY    . ANTERIOR CERVICAL DECOMP/DISCECTOMY FUSION N/A 01/18/2014   Procedure: ANTERIOR CERVICAL DECOMPRESSION/DISCECTOMY FUSION 3 LEVELS  Cervical  four/five, five/six, six/seven anterior cervical decompression with fusion interbody prosthesis with plating and bonegraft;  Surgeon: Newman Pies, MD;  Location: [place] [facility];  Service: Neurosurgery;  Laterality: N/A;  t  . APPENDECTOMY    . BACK SURGERY     2  . BREAST SURGERY     cystectomy-benign  . CARPAL TUNNEL RELEASE Right 06/12/2014   Procedure: CARPAL TUNNEL RELEASE;  Surgeon: Newman Pies, MD;  Location: [place] [facility];  Service: Neurosurgery;  Laterality: Right;  Right Carpal Tunnel Release  .  CATARACT EXTRACTION Bilateral 02/15/2015  . CHOLECYSTECTOMY    . COLONOSCOPY    . I&D of abdomen  1988   couple of wks after gallbladder removed  . KNEE SURGERY     left x 3  . LUMBAR LAMINECTOMY     X 2  . mortons neuromas removed    . SHOULDER SURGERY     Right    Social History:  reports that  has never smoked. she has never used smokeless tobacco. She reports that she does not drink alcohol or use drugs.  Allergies:  Allergies  Allergen Reactions  . Bee Venom Anaphylaxis and Other (See Comments)    Yellow jackets, wasps as well  . Ivp Dye [Iodinated Diagnostic Agents] Anaphylaxis  . Shellfish Allergy Anaphylaxis  . Amlodipine Besylate Swelling  . Aspirin Other (See Comments)    REACTION: unspecified  . Atorvastatin Diarrhea  . Cholestyramine Other (See Comments)    REACTION: mouth  irritation  . Codeine Phosphate Nausea And Vomiting  . Demerol [Meperidine] Other (See Comments)    Severe Hallucination!!!!  . Diphenhydramine Hcl Other (See Comments)    hyperactive  . Doxycycline Nausea Only  . Gentamicin Sulfate Other (See Comments)    REACTION: unspecified  . Meclizine Hcl Other (See Comments)    REACTION: more dizziness on it  . Meperidine Hcl Other (See Comments)    Unknown  . Metronidazole Other (See Comments)    REACTION: bad taste  . Moxifloxacin Other (See Comments)    REACTION: insomnia Able to use Cipro  . Sulfamethoxazole Other (See Comments)    Kidney problems  . Penicillins Swelling, Rash and Other (See Comments)    Has patient had a PCN reaction causing immediate rash, facial/tongue/throat swelling, SOB or lightheadedness with hypotension: Yes Has patient had a PCN reaction causing severe rash involving mucus membranes or skin necrosis: No Has patient had a PCN reaction that required hospitalization: No Has patient had a PCN reaction occurring within the last 10 years: No If all of the above answers are "NO", then may proceed with Cephalosporin use.     Medications: No current facility-administered medications for this encounter.    Current Outpatient Medications  Medication Sig Dispense Refill  . Cholecalciferol (VITAMIN D3) 50000 units CAPS Take 1 capsule by mouth every 14 (fourteen) days. (Patient not taking: Reported on 05/27/2017) 6 capsule 15  . diphenoxylate-atropine (LOMOTIL) 2.5-0.025 MG tablet Take 1 tablet by mouth 2 (two) times daily as needed for diarrhea or loose stools. 30 tablet 2  . docusate sodium 100 MG CAPS Take 100 mg by mouth 2 (two) times daily. (Patient taking differently: Take 100 mg by mouth 2 (two) times daily as needed (for constipation). ) 60 capsule 0  . EPINEPHrine 0.3 mg/0.3 mL IJ SOAJ injection As dirrected (Patient taking differently: Inject 0.3 mg into the muscle once. ) 1 Device 3  . fish oil-omega-3 fatty  acids 1000 MG capsule Take 2 g by mouth daily.     . fluticasone (FLONASE) 50 MCG/ACT nasal spray Place 2 sprays into both nostrils daily. (Patient taking differently: Place 1 spray into both nostrils every evening. ) 58 g 3  . loperamide (IMODIUM A-D) 2 MG tablet Take 2 mg by mouth 4 (four) times daily as needed for diarrhea or loose stools. Will take one by mouth 47min before meals or two as needed.    Marland Kitchen losartan (COZAAR) 100 MG tablet Take 1 tablet (100 mg total) by mouth daily. 90 tablet 3  .  traMADol (ULTRAM) 50 MG tablet Take 1-2 tablets (50-100 mg total) by mouth every 12 (twelve) hours as needed for moderate pain. 360 tablet 1  . valACYclovir (VALTREX) 1000 MG tablet Take 1 tablet (1,000 mg total) by mouth 2 (two) times daily. 180 tablet 3  . warfarin (COUMADIN) 5 MG tablet Take 2 tablets on Mondays of every week. All other days--take 1&1/2 tablets. (Patient taking differently: Take 7.5-10 mg by mouth See admin instructions. Take 10 mg by mouth daily on Monday and Friday. Take 7.5 mg by mouth daily on all other days) 135 tablet 3  . zolpidem (AMBIEN) 10 MG tablet 1 & 1/2 TABLETS (15MG ) BY MOUTH AT BEDTIME AS NEEDED FOR SLEEP, OK TOREPEAT IN 4HRS (Patient taking differently: Take 10-15 mg by mouth at bedtime as needed for sleep (May take additional 10 mg if needed). ) 135 tablet 1  . acyclovir ointment (ZOVIRAX) 5 % Apply 1 application topically daily as needed (for outbreak). (Patient not taking: Reported on 05/20/2017) 15 g 2  . [START ON 06/03/2017] enoxaparin (LOVENOX) 40 MG/0.4ML injection Inject 0.8 mLs (80 mg total) into the skin daily for 4 days. 5 Syringe 1  . hydrochlorothiazide (MICROZIDE) 12.5 MG capsule Take 1 capsule (12.5 mg total) by mouth daily. (Patient not taking: Reported on 05/20/2017) 90 capsule 3  . triamcinolone ointment (KENALOG) 0.5 % Apply topically 4 (four) times daily. (Patient not taking: Reported on 05/20/2017) 30 g 3      Review of Systems  Constitutional:  Negative.   HENT: Negative.   Eyes: Negative.   Respiratory: Negative.   Cardiovascular: Negative.   Gastrointestinal: Positive for diarrhea and heartburn.  Genitourinary: Positive for frequency.  Musculoskeletal: Positive for joint pain.  Skin: Negative.   Neurological: Negative.   Endo/Heme/Allergies: Positive for environmental allergies.  Psychiatric/Behavioral: Negative.        Physical Exam  Constitutional: She is oriented to person, place, and time. She appears well-developed.  HENT:  Head: Normocephalic.  Eyes: Pupils are equal, round, and reactive to light.  Neck: Neck supple. No JVD present. No tracheal deviation present. No thyromegaly present.  Cardiovascular: Normal rate, regular rhythm and intact distal pulses.  Murmur heard. Respiratory: Effort normal and breath sounds normal. No respiratory distress. She has no wheezes.  GI: Soft. There is no tenderness. There is no guarding.  Musculoskeletal:       Right hip: She exhibits decreased strength, tenderness, bony tenderness and swelling.  Lymphadenopathy:    She has no cervical adenopathy.  Neurological: She is alert and oriented to person, place, and time. A sensory deficit (bilateral LE neuropathy) is present.  Skin: Skin is warm and dry.  Psychiatric: She has a normal mood and affect.       Assessment/Plan Assessment: Right hip persistent bursitis and gluteus medius/minimus tears   Plan: Patient will undergo a right hip open bursectomy and attempted repair of gluteal tendon on 06/01/2017 per Dr. Alvan Dame at [facility]. Risks benefits and expectations were discussed with the patient. Patient understand risks, benefits and expectations and wishes to proceed.   [place] Pugh Neriah Brott   PA-C  05/31/2017, 10:11 PM

## 2017-06-01 ENCOUNTER — Ambulatory Visit (HOSPITAL_COMMUNITY)
Admission: RE | Admit: 2017-06-01 | Discharge: 2017-06-01 | Disposition: A | Discharge status: Home / Self Care | Payer: 59 | Source: Ambulatory Visit | Attending: Orthopedic Surgery | Admitting: Orthopedic Surgery

## 2017-06-01 ENCOUNTER — Encounter (HOSPITAL_COMMUNITY)
Admission: RE | Disposition: A | Discharge status: Home / Self Care | Payer: Self-pay | Source: Ambulatory Visit | Attending: Orthopedic Surgery

## 2017-06-01 ENCOUNTER — Other Ambulatory Visit: Payer: Self-pay

## 2017-06-01 ENCOUNTER — Telehealth: Payer: Self-pay | Admitting: *Deleted

## 2017-06-01 ENCOUNTER — Encounter (HOSPITAL_COMMUNITY): Payer: Self-pay | Admitting: *Deleted

## 2017-06-01 DIAGNOSIS — Z85831 Personal history of malignant neoplasm of soft tissue: Secondary | ICD-10-CM | POA: Insufficient documentation

## 2017-06-01 DIAGNOSIS — Z91030 Bee allergy status: Secondary | ICD-10-CM | POA: Diagnosis not present

## 2017-06-01 DIAGNOSIS — Z882 Allergy status to sulfonamides status: Secondary | ICD-10-CM | POA: Diagnosis not present

## 2017-06-01 DIAGNOSIS — Z86718 Personal history of other venous thrombosis and embolism: Secondary | ICD-10-CM | POA: Diagnosis not present

## 2017-06-01 DIAGNOSIS — M7071 Other bursitis of hip, right hip: Secondary | ICD-10-CM | POA: Insufficient documentation

## 2017-06-01 DIAGNOSIS — D649 Anemia, unspecified: Secondary | ICD-10-CM | POA: Diagnosis not present

## 2017-06-01 DIAGNOSIS — I739 Peripheral vascular disease, unspecified: Secondary | ICD-10-CM | POA: Diagnosis not present

## 2017-06-01 DIAGNOSIS — Z9049 Acquired absence of other specified parts of digestive tract: Secondary | ICD-10-CM | POA: Insufficient documentation

## 2017-06-01 DIAGNOSIS — J309 Allergic rhinitis, unspecified: Secondary | ICD-10-CM | POA: Diagnosis not present

## 2017-06-01 DIAGNOSIS — Z87440 Personal history of urinary (tract) infections: Secondary | ICD-10-CM | POA: Insufficient documentation

## 2017-06-01 DIAGNOSIS — Z885 Allergy status to narcotic agent status: Secondary | ICD-10-CM | POA: Insufficient documentation

## 2017-06-01 DIAGNOSIS — Z90710 Acquired absence of both cervix and uterus: Secondary | ICD-10-CM | POA: Insufficient documentation

## 2017-06-01 DIAGNOSIS — M545 Low back pain: Secondary | ICD-10-CM | POA: Insufficient documentation

## 2017-06-01 DIAGNOSIS — X58XXXA Exposure to other specified factors, initial encounter: Secondary | ICD-10-CM | POA: Diagnosis not present

## 2017-06-01 DIAGNOSIS — Z888 Allergy status to other drugs, medicaments and biological substances status: Secondary | ICD-10-CM | POA: Insufficient documentation

## 2017-06-01 DIAGNOSIS — M7061 Trochanteric bursitis, right hip: Secondary | ICD-10-CM | POA: Diagnosis present

## 2017-06-01 DIAGNOSIS — Z79899 Other long term (current) drug therapy: Secondary | ICD-10-CM | POA: Diagnosis not present

## 2017-06-01 DIAGNOSIS — K219 Gastro-esophageal reflux disease without esophagitis: Secondary | ICD-10-CM | POA: Insufficient documentation

## 2017-06-01 DIAGNOSIS — Z880 Allergy status to penicillin: Secondary | ICD-10-CM | POA: Diagnosis not present

## 2017-06-01 DIAGNOSIS — Z91041 Radiographic dye allergy status: Secondary | ICD-10-CM | POA: Insufficient documentation

## 2017-06-01 DIAGNOSIS — I10 Essential (primary) hypertension: Secondary | ICD-10-CM | POA: Insufficient documentation

## 2017-06-01 DIAGNOSIS — Z7901 Long term (current) use of anticoagulants: Secondary | ICD-10-CM | POA: Diagnosis not present

## 2017-06-01 DIAGNOSIS — S76011A Strain of muscle, fascia and tendon of right hip, initial encounter: Secondary | ICD-10-CM | POA: Diagnosis not present

## 2017-06-01 DIAGNOSIS — Z981 Arthrodesis status: Secondary | ICD-10-CM | POA: Insufficient documentation

## 2017-06-01 DIAGNOSIS — Z886 Allergy status to analgesic agent status: Secondary | ICD-10-CM | POA: Diagnosis not present

## 2017-06-01 DIAGNOSIS — Z7951 Long term (current) use of inhaled steroids: Secondary | ICD-10-CM | POA: Diagnosis not present

## 2017-06-01 DIAGNOSIS — M1990 Unspecified osteoarthritis, unspecified site: Secondary | ICD-10-CM | POA: Insufficient documentation

## 2017-06-01 DIAGNOSIS — M810 Age-related osteoporosis without current pathological fracture: Secondary | ICD-10-CM | POA: Insufficient documentation

## 2017-06-01 DIAGNOSIS — Z86010 Personal history of colonic polyps: Secondary | ICD-10-CM | POA: Insufficient documentation

## 2017-06-01 DIAGNOSIS — E785 Hyperlipidemia, unspecified: Secondary | ICD-10-CM | POA: Diagnosis not present

## 2017-06-01 DIAGNOSIS — Z538 Procedure and treatment not carried out for other reasons: Secondary | ICD-10-CM | POA: Diagnosis not present

## 2017-06-01 DIAGNOSIS — G4700 Insomnia, unspecified: Secondary | ICD-10-CM | POA: Insufficient documentation

## 2017-06-01 DIAGNOSIS — K589 Irritable bowel syndrome without diarrhea: Secondary | ICD-10-CM | POA: Insufficient documentation

## 2017-06-01 DIAGNOSIS — Q600 Renal agenesis, unilateral: Secondary | ICD-10-CM | POA: Insufficient documentation

## 2017-06-01 LAB — TYPE AND SCREEN
ABO/RH(D): AB POS
Antibody Screen: NEGATIVE

## 2017-06-01 LAB — PROTIME-INR
INR: 1.04
PROTHROMBIN TIME: 13.5 s (ref 11.4–15.2)

## 2017-06-01 SURGERY — RELEASE, BURSA, TROCHANTERIC
Anesthesia: General | Laterality: Right

## 2017-06-01 MED ORDER — LACTATED RINGERS IV SOLN
INTRAVENOUS | Status: DC
  Administered 2017-06-01: 13:00:00 via INTRAVENOUS

## 2017-06-01 MED ORDER — MIDAZOLAM HCL 2 MG/2ML IJ SOLN
INTRAMUSCULAR | Status: AC
  Filled 2017-06-01: qty 2

## 2017-06-01 MED ORDER — VANCOMYCIN HCL IN DEXTROSE 1-5 GM/200ML-% IV SOLN
INTRAVENOUS | Status: AC
  Filled 2017-06-01: qty 200

## 2017-06-01 MED ORDER — DEXAMETHASONE SODIUM PHOSPHATE 10 MG/ML IJ SOLN: 10.0000 mg | Freq: Once | INTRAMUSCULAR | Status: DC

## 2017-06-01 MED ORDER — PROPOFOL 10 MG/ML IV BOLUS
INTRAVENOUS | Status: AC
  Filled 2017-06-01: qty 40

## 2017-06-01 MED ORDER — VANCOMYCIN HCL IN DEXTROSE 1-5 GM/200ML-% IV SOLN
1000.0000 mg | INTRAVENOUS | Status: AC
  Administered 2017-06-01: 1000 mg via INTRAVENOUS

## 2017-06-01 MED ORDER — DEXAMETHASONE SODIUM PHOSPHATE 10 MG/ML IJ SOLN
INTRAMUSCULAR | Status: AC
  Filled 2017-06-01: qty 1

## 2017-06-01 MED ORDER — FENTANYL CITRATE (PF) 100 MCG/2ML IJ SOLN
INTRAMUSCULAR | Status: AC
  Filled 2017-06-01: qty 2

## 2017-06-01 MED ORDER — CHLORHEXIDINE GLUCONATE 4 % EX LIQD: 60.0000 mL | Freq: Once | CUTANEOUS | Status: DC

## 2017-06-01 MED ORDER — ONDANSETRON HCL 4 MG/2ML IJ SOLN
INTRAMUSCULAR | Status: AC
  Filled 2017-06-01: qty 2

## 2017-06-01 NOTE — Interval H&P Note (Signed)
History and Physical Interval Note:  06/01/2017 2:06 PM  Tracy Peters  has presented today for surgery, with the diagnosis of Right hip persistent bursitis, gluteus medius/minimus tears  The various methods of treatment have been discussed with the patient and family. After consideration of risks, benefits and other options for treatment, the patient has consented to  Procedure(s) with comments: Right hip open bursectomy, attempt repair of gluteal tendon (Right) - 90 mins as a surgical intervention .  The patient's history has been reviewed, patient examined, no change in status, stable for surgery.  I have reviewed the patient's chart and labs.  Questions were answered to the patient's satisfaction.     Mauri Pole

## 2017-06-01 NOTE — Telephone Encounter (Signed)
Per front office -received call from [place] at [facility] stating pt's surgery was "canceled" and they would like to know when to resume warfarin.  Will send request to [facility] for review.  Please advise.Despina Hidden Cassady12/17/20184:10 PM

## 2017-06-01 NOTE — Anesthesia Preprocedure Evaluation (Addendum)
Anesthesia Evaluation  Patient identified by MRN, date of birth, ID band Patient awake    Reviewed: Allergy & Precautions, H&P , NPO status , Patient's Chart, lab work & pertinent test results  Airway Mallampati: II  TM Distance: >3 FB Neck ROM: Full    Dental no notable dental hx.    Pulmonary neg pulmonary ROS,    Pulmonary exam normal breath sounds clear to auscultation       Cardiovascular hypertension, Pt. on medications + Peripheral Vascular Disease  Normal cardiovascular exam Rhythm:Regular Rate:Normal  ECG: NSR, rate 77   Neuro/Psych  Neuromuscular disease negative psych ROS   GI/Hepatic negative GI ROS, Neg liver ROS,   Endo/Other  negative endocrine ROS  Renal/GU      Musculoskeletal  (+) Arthritis , Right hip persistent bursitis Gluteus medius/minimus tears LBP (low back pain)   Abdominal   Peds  Hematology  (+) anemia , History of DVT (deep vein thrombosis) Arterial thrombosis    Anesthesia Other Findings Right hip persistent bursitis,  Gluteus medius/minimus tears  Reproductive/Obstetrics                          Anesthesia Physical  Anesthesia Plan  ASA: III  Anesthesia Plan: General   Post-op Pain Management:    Induction: Intravenous  PONV Risk Score and Plan: 3 and Dexamethasone, Ondansetron and Treatment may vary due to age or medical condition  Airway Management Planned: Oral ETT  Additional Equipment:   Intra-op Plan:   Post-operative Plan: Extubation in OR  Informed Consent: I have reviewed the patients History and Physical, chart, labs and discussed the procedure including the risks, benefits and alternatives for the proposed anesthesia with the patient or authorized representative who has indicated his/her understanding and acceptance.   Dental advisory given  Plan Discussed with: CRNA  Anesthesia Plan Comments:     Anesthesia Quick  Evaluation

## 2017-06-01 NOTE — Progress Notes (Signed)
Patient in short stay as prep for surgery today.  Patient voiced to Dr. Alvan Dame that she had understood from office preop dicussion that surgery was to be changed to a total hip replacement instead of bursectomy.  Upon further discussion and review of xrays/office notes,  Dr. Alvan Dame stated that it should have been rescheduled as total joint replacement for the right hip.  Because insurance has not preapproved  this, surgery was delayed while insurance was contacted.  After discussion between [place], decision made to postpone surgery until insurance approval can be obtained.  IV d/c'ed.  Patient dressing for discharge.  Dr. Beryle Beams notified re cancellation.  Patient advised to restart Coumadin today, start Lovenox injections today, and to return to office Friday am for PT, INR recheck.  Patient states she had meds at home and will do as instructed.  Discharged and to follow up with Dr. Honor Loh office for reschedule of surgery.  Ambulatory to car , accompanied by husband.

## 2017-06-02 NOTE — Progress Notes (Addendum)
NEED ORDERS IN Epic ASAP FOR 06-15-17 SURGERY.

## 2017-06-04 ENCOUNTER — Encounter (HOSPITAL_COMMUNITY): Payer: Self-pay

## 2017-06-04 ENCOUNTER — Other Ambulatory Visit: Payer: Self-pay

## 2017-06-04 NOTE — Progress Notes (Signed)
Spoke with darlene whitlow regarding patient needed for surgery being rescheduled and darlene will speak with dr Alvan Dame and confirm procedure now scheduled for 06-15-17.

## 2017-06-04 NOTE — Progress Notes (Signed)
Tracy Peters called and confirmed per darlene whitlow that per dr Alvan Dame correct surgery is now scheduled.

## 2017-06-04 NOTE — Progress Notes (Signed)
Spoke with patient by phone to update medical history and give instructions for surgery 06-15-17, patient to see dr Beryle Beams 06-05-17 for instructions regarding resuming coumadin and lovenox for 06-15-17 surgery.

## 2017-06-05 ENCOUNTER — Ambulatory Visit (INDEPENDENT_AMBULATORY_CARE_PROVIDER_SITE_OTHER): Payer: 59 | Admitting: Pharmacist

## 2017-06-05 DIAGNOSIS — I742 Embolism and thrombosis of arteries of the upper extremities: Secondary | ICD-10-CM | POA: Diagnosis not present

## 2017-06-05 DIAGNOSIS — Z7901 Long term (current) use of anticoagulants: Secondary | ICD-10-CM | POA: Diagnosis not present

## 2017-06-05 DIAGNOSIS — D6859 Other primary thrombophilia: Secondary | ICD-10-CM | POA: Diagnosis not present

## 2017-06-05 DIAGNOSIS — Z86718 Personal history of other venous thrombosis and embolism: Secondary | ICD-10-CM | POA: Diagnosis not present

## 2017-06-05 LAB — POCT INR: INR: 1.3

## 2017-06-05 NOTE — Patient Instructions (Signed)
Patient educated about medication as defined in this encounter and verbalized understanding by repeating back instructions provided.   

## 2017-06-05 NOTE — Progress Notes (Signed)
Anticoagulation Management Tracy Peters is a 83 y.o. female who reports to the clinic for monitoring of warfarin treatment.    Indication: Primary hypercoagulable state and histor of venous thrombosis and embolism.  Duration: indefinite Supervising physician: Murriel Hopper  Anticoagulation Clinic Visit History: Patient does not report signs/symptoms of bleeding or thromboembolism. She states she is scheduled for hip replacement 06/15/17. She is currently on warfarin + enoxaparin   Anticoagulation Episode Summary    Current INR goal:   2.0-3.0  TTR:   68.5 % (6.1 y)  Next INR check:   06/11/2017  INR from last check:   1.3! (06/05/2017)  Weekly max warfarin dose:     Target end date:   Indefinite  INR check location:   [facility]  Preferred lab:     Send INR reminders to:      Indications   Chronic anticoagulation [Z79.01] DVT HX OF [Z86.718] 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HCC) [I74.2] Primary hypercoagulable state ([place]) [D68.59] [D68.59] Long term (current) use of anticoagulants [Z79.01] [Z79.01]       Comments:         Anticoagulation Care Providers    Provider Role Specialty Phone number   Annia Belt, MD Referring Oncology [phone]     Allergies  Allergen Reactions  . Bee Venom Anaphylaxis and Other (See Comments)    Yellow jackets, wasps as well  . Ivp Dye [Iodinated Diagnostic Agents] Anaphylaxis  . Shellfish Allergy Anaphylaxis  . Amlodipine Besylate Swelling  . Aspirin Other (See Comments)    REACTION: unspecified  . Atorvastatin Diarrhea  . Cholestyramine Other (See Comments)    REACTION: mouth irritation  . Codeine Phosphate Nausea And Vomiting  . Demerol [Meperidine] Other (See Comments)    Severe Hallucination!!!!  . Diphenhydramine Hcl Other (See Comments)    hyperactive  . Doxycycline Nausea Only  . Gentamicin Sulfate Other (See Comments)    REACTION: unspecified  . Meclizine Hcl Other (See Comments)    REACTION:  more dizziness on it  . Meperidine Hcl Other (See Comments)    Unknown  . Metronidazole Other (See Comments)    REACTION: bad taste  . Moxifloxacin Other (See Comments)    REACTION: insomnia Able to use Cipro  . Sulfamethoxazole Other (See Comments)    Kidney problems  . Penicillins Swelling, Rash and Other (See Comments)    Has patient had a PCN reaction causing immediate rash, facial/tongue/throat swelling, SOB or lightheadedness with hypotension: Yes Has patient had a PCN reaction causing severe rash involving mucus membranes or skin necrosis: No Has patient had a PCN reaction that required hospitalization: No Has patient had a PCN reaction occurring within the last 10 years: No If all of the above answers are "NO", then may proceed with Cephalosporin use.    Medication Sig  acyclovir ointment (ZOVIRAX) 5 % Apply 1 application topically daily as needed (for outbreak). Patient not taking: Reported on 05/20/2017  Cholecalciferol (VITAMIN D3) 50000 units CAPS Take 1 capsule by mouth every 14 (fourteen) days.  diphenoxylate-atropine (LOMOTIL) 2.5-0.025 MG tablet Take 1 tablet by mouth 2 (two) times daily as needed for diarrhea or loose stools.  docusate sodium 100 MG CAPS Take 100 mg by mouth 2 (two) times daily. Patient taking differently: Take 100 mg by mouth 2 (two) times daily as needed (for constipation).   enoxaparin (LOVENOX) 40 MG/0.4ML injection Inject 0.8 mLs (80 mg total) into the skin daily for 4 days.  EPINEPHrine 0.3 mg/0.3 mL IJ SOAJ  injection As dirrected Patient taking differently: Inject 0.3 mg into the muscle once.   fish oil-omega-3 fatty acids 1000 MG capsule Take 2 g by mouth daily.   fluticasone (FLONASE) 50 MCG/ACT nasal spray Place 2 sprays into both nostrils daily. Patient taking differently: Place 1 spray into both nostrils every evening.   hydrochlorothiazide (MICROZIDE) 12.5 MG capsule Take 1 capsule (12.5 mg total) by mouth daily. Patient not taking:  Reported on 05/20/2017  loperamide (IMODIUM A-D) 2 MG tablet Take 2 mg by mouth 4 (four) times daily as needed for diarrhea or loose stools. Will take one by mouth 46min before meals or two as needed.  losartan (COZAAR) 100 MG tablet Take 1 tablet (100 mg total) by mouth daily.  traMADol (ULTRAM) 50 MG tablet Take 1-2 tablets (50-100 mg total) by mouth every 12 (twelve) hours as needed for moderate pain.  triamcinolone ointment (KENALOG) 0.5 % Apply topically 4 (four) times daily. Patient not taking: Reported on 05/20/2017  valACYclovir (VALTREX) 1000 MG tablet Take 1 tablet (1,000 mg total) by mouth 2 (two) times daily.  warfarin (COUMADIN) 5 MG tablet Take 2 tablets on Mondays of every week. All other days--take 1&1/2 tablets. Patient taking differently: Take 7.5-10 mg by mouth See admin instructions. Take 10 mg by mouth daily on Monday and Friday. Take 7.5 mg by mouth daily on all other days  zolpidem (AMBIEN) 10 MG tablet 1 & 1/2 TABLETS (15MG ) BY MOUTH AT BEDTIME AS NEEDED FOR SLEEP, OK TOREPEAT IN 4HRS Patient taking differently: Take 10-15 mg by mouth at bedtime as needed for sleep (May take additional 10 mg if needed).    Past Medical History:  Diagnosis Date  . Adenomatous colon polyp   . Allergic rhinitis    uses FLonase nightly  . Anemia   . Arthritis    joint pain  . Blood transfusion   . Bronchitis    hx of-7-57yrs ago  . Carpal tunnel syndrome    numbness and tingling   . Cataracts, bilateral   . Chronic anticoagulation 03/21/2013  . Congenital absence of one kidney   . Diarrhea    takes Immodium daily as needed or Lomotil   . Diverticulosis   . GERD (gastroesophageal reflux disease)    mild  . Herpes    takes Valtrex daily  . History of colon polyps   . History of DVT (deep vein thrombosis) 1998   right arm  . History of staph infection   . Hyperlipidemia    Medical MD doesn't think its absolutely necessary  . Hypertension    takes Losartan daily  . IBS  (irritable bowel syndrome)    takes Electronics engineer daily  . Insomnia    takes Ambien nightly as needed  . LBP (low back pain)   . Long term (current) use of anticoagulants    Dr. Beryle Beams  . Nocturia   . Osteoporosis   . Raynaud's disease   . 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 Winfield Cunas  . 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 Urinary leakage   . UTI (urinary tract infection)    Social History   Socioeconomic History  . Marital status: Married    Spouse name: Not on file  . Number of children: 2  . Years of education: Not on file  . Highest education level: Not on file  Social Needs  . Financial resource strain: Not on file  .  Food insecurity - worry: Not on file  . Food insecurity - inability: Not on file  . Transportation needs - medical: Not on file  . Transportation needs - non-medical: Not on file  Occupational History  . Occupation: Retired    Fish farm manager: RETIRED  Tobacco Use  . Smoking status: Never Smoker  . Smokeless tobacco: Never Used  Substance and Sexual Activity  . Alcohol use: No    Alcohol/week: 0.0 oz  . Drug use: No  . Sexual activity: No  Other Topics Concern  . Not on file  Social History Narrative  . Not on file   Family History  Problem Relation Age of Onset  . Breast cancer Sister   . Kidney disease Sister 6       nephritis  . Parkinsonism Mother   . Heart disease Brother   . Osteoarthritis Other   . Ulcerative colitis Sister   . Colon cancer Neg Hx    ASSESSMENT Recent Results: Lab Results  Component Value Date   INR 1.3 06/05/2017   INR 1.04 06/01/2017   INR 2.5 05/27/2017   PROTIME 39.6 (H) 04/03/2015   Anticoagulation Dosing: Description   Take 1 & 1/2 tablets by mouth once-daily at 6PM each day except on Monday and Friday when you will take 2 whole tablets of the 5 mg Pe[place] colored tablets. DR Azucena Freed PATIENT (route warfarin notes and list as authorizing  provider for LOS & Follow-up, lab orders and warfarin prescriptions), do not list attending physician      INR today: Subtherapeutic  PLAN Continue enoxaparin 80 mg daily, increase warfarin from 57.5 mg/week to 60 mg/week. Follow up 06/11/17 for further instructions to prepare for upcoming hip replacement  Patient Instructions  Patient educated about medication as defined in this encounter and verbalized understanding by repeating back instructions provided.    Patient advised to contact clinic or seek medical attention if signs/symptoms of bleeding or thromboembolism occur.  Patient verbalized understanding by repeating back information and was advised to contact me if further medication-related questions arise. Patient was also provided an information handout.  Follow-up Return in about 1 week (around 06/12/2017).  Flossie Dibble

## 2017-06-10 ENCOUNTER — Other Ambulatory Visit (HOSPITAL_COMMUNITY): Payer: Self-pay | Admitting: *Deleted

## 2017-06-11 ENCOUNTER — Other Ambulatory Visit (INDEPENDENT_AMBULATORY_CARE_PROVIDER_SITE_OTHER): Payer: 59

## 2017-06-11 ENCOUNTER — Telehealth: Payer: Self-pay | Admitting: *Deleted

## 2017-06-11 DIAGNOSIS — Z7901 Long term (current) use of anticoagulants: Secondary | ICD-10-CM

## 2017-06-11 LAB — POCT INR: INR: 2.2

## 2017-06-11 NOTE — Telephone Encounter (Signed)
Called pt - instructed 1) Stop Coumadin today; 2) No Lovenox on Sunday; 3) Re-start Coumadin night of surgery; 4) Re-start Lovenox 80 mg 1 day after the surgery; 4) and f/u with Dr Beryle Beams Friday after the surgery @ 1330 PM. Per Dr Beryle Beams. Pt repeated instructions and voiced understanding.

## 2017-06-11 NOTE — Telephone Encounter (Deleted)
-----   Message from Annia Belt, MD sent at 06/11/2017  3:11 PM EST ----- OK to stop lovenox and stay on current dose of coumadin. Get her back on regular PT/INR schedule with Dr Elie Confer

## 2017-06-12 NOTE — Progress Notes (Signed)
Left message at [facility] to return call to ensure surgery has been pre approved by insurance. Spoke with patient by phone and patient will arrive at 145 pm 06-15-17 [facility] admitting.

## 2017-06-14 NOTE — H&P (Signed)
TOTAL HIP ADMISSION H&P  Patient is admitted for right total hip arthroplasty, posterior approach with bursectomy and attempted gluteal tendon repair.  Subjective:  Chief Complaint: Right hip primary OA / pain, chronic bursitis and gluteal tendon tear  HPI: Tracy Peters, 83 y.o. female, has a history of pain and functional disability in the right hip(s) due to arthritis and patient has failed non-surgical conservative treatments for greater than 12 weeks to include NSAID's and/or analgesics, corticosteriod injections, use of assistive devices and activity modification.  Onset of symptoms was gradual starting  years ago with gradually worsening course since that time.The patient noted no past surgery on the right hip(s).  Patient currently rates pain in the right hip at 10 out of 10 with activity. Patient has night pain, worsening of pain with activity and weight bearing, trendelenberg gait, pain that interfers with activities of daily living and pain with passive range of motion. Patient has evidence of periarticular osteophytes and joint space narrowing by imaging studies. This condition presents safety issues increasing the risk of falls.  There is no current active infection.  Risks, benefits and expectations were discussed with the patient.  Risks including but not limited to the risk of anesthesia, blood clots, nerve damage, blood vessel damage, failure of the prosthesis, infection and up to and including death.  Patient understand the risks, benefits and expectations and wishes to proceed with surgery.   PCP: Plotnikov, Evie Lacks, MD  D/C Plans:       SNF  Post-op Meds:       No Rx given   Tranexamic Acid:      To be given -   topically  Decadron:      Is to be given  FYI: Coumadin - start night of surgery at regular dose Lovenox bridge - 80 qd starting 24 hrs after surgery Norco  DME: Rx given for - RW  PT:  No PT    Patient Active Problem List    Diagnosis Date Noted  . Right bursectomy 06/01/2017  . Preop exam for internal medicine 05/20/2017  . Cerumen impaction 01/27/2017  . Actinic keratosis 01/27/2017  . Primary hypercoagulable state ([place]) [D68.59] 11/17/2016  . Long term (current) use of anticoagulants [Z79.01] 11/17/2016  . Osteopenia 07/14/2016  . Right-sided chest wall pain 04/17/2016  . 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 Dysuria 06/15/2015  . Cervical spondylosis with myelopathy and radiculopathy 01/18/2014  . Cervical radiculitis 12/28/2013  . Left knee pain 12/28/2013  . Paronychia 12/28/2013  . Chronic anticoagulation 03/21/2013  . Well adult exam 06/02/2012  . 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 Urticaria 05/28/2011  . DIVERTICULOSIS, COLON 04/30/2010  . COLONIC POLYPS, ADENOMATOUS, HX OF 04/30/2010  . Dyslipidemia 09/20/2009  . VERTIGO 09/20/2009  . ALLERGIC RHINITIS 03/27/2009  . INSOMNIA, PERSISTENT 09/20/2008  . PARESTHESIA 09/20/2008  . RASH AND OTHER NONSPECIFIC SKIN ERUPTION 03/22/2008  . ACUTE BRONCHITIS 02/22/2008  . Nonspecific (abnormal) findings on radiological and other examination of body structure 02/22/2008  . CHEST XRAY, ABNORMAL 02/22/2008  . GERD 09/21/2007  . Essential hypertension 03/23/2007  . LOW BACK PAIN 03/23/2007  . DVT, HX OF 03/23/2007  . Diarrhea 03/18/2007   Past Medical History:  Diagnosis Date  . Adenomatous colon polyp   . Allergic rhinitis    uses FLonase nightly  . Anemia   . Arthritis    joint pain  . Blood transfusion   . Bronchitis  hx of-7-60yr ago  . Carpal tunnel syndrome    numbness and tingling   . Cataracts, bilateral   . Chronic anticoagulation 03/21/2013  . Congenital absence of one kidney   . Diarrhea    takes Immodium daily as needed or Lomotil   . Diverticulosis   . GERD (gastroesophageal reflux disease)    mild  . Herpes    takes Valtrex daily  . History of colon polyps   . History of DVT (deep vein  thrombosis) 1998   right arm  . History of staph infection   . Hyperlipidemia    Medical MD doesn't think its absolutely necessary  . Hypertension    takes Losartan daily  . IBS (irritable bowel syndrome)    takes AElectronics engineerdaily  . Insomnia    takes Ambien nightly as needed  . LBP (low back pain)   . Long term (current) use of anticoagulants    Dr. GBeryle Beams . Nocturia   . Osteoporosis   . Raynaud's disease   . [place]    Dr. AWinfield Cunas . 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 09[place]16[place]   Right digital artery 1998 idiopathic   . Urinary leakage   . UTI (urinary tract infection)     Past Surgical History:  Procedure Laterality Date  . ABDOMINAL HYSTERECTOMY    . ANTERIOR CERVICAL DECOMP/DISCECTOMY FUSION N/A 01/18/2014   Procedure: ANTERIOR CERVICAL DECOMPRESSION/DISCECTOMY FUSION 3 LEVELS  Cervical  four/five, five/six, six/seven anterior cervical decompression with fusion interbody prosthesis with plating and bonegraft;  Surgeon: JNewman Pies MD;  [facility];  Service: Neurosurgery;  Laterality: N/A;  t  . APPENDECTOMY    . BACK SURGERY     2  . BREAST SURGERY     cystectomy-benign  . CARPAL TUNNEL RELEASE Right 06/12/2014   Procedure: CARPAL TUNNEL RELEASE;  Surgeon: JNewman Pies MD;  [facility];  Service: Neurosurgery;  Laterality: Right;  Right Carpal Tunnel Release  . CATARACT EXTRACTION Bilateral 02/15/2015  . CHOLECYSTECTOMY    . COLONOSCOPY    . I&D of abdomen  1988   couple of wks after gallbladder removed  . KNEE SURGERY     left x 3  . LUMBAR LAMINECTOMY     X 2  . mortons neuromas removed    . SHOULDER SURGERY     Right    No current facility-administered medications for this encounter.    Current Outpatient Medications  Medication Sig Dispense Refill Last Dose  . Cholecalciferol (VITAMIN D3) 50000 units CAPS Take 1 capsule by mouth every 14 (fourteen) days. (Patient taking differently: Take  50,000 Units by mouth every 14 (fourteen) days. ) 6 capsule 15 Past Month at Unknown time  . diphenoxylate-atropine (LOMOTIL) 2.5-0.025 MG tablet Take 1 tablet by mouth 2 (two) times daily as needed for diarrhea or loose stools. 30 tablet 2 More than a month at Unknown time  . docusate sodium 100 MG CAPS Take 100 mg by mouth 2 (two) times daily. (Patient taking differently: Take 100 mg by mouth 2 (two) times daily as needed (for constipation). ) 60 capsule 0 More than a month at Unknown time  . EPINEPHrine 0.3 mg/0.3 mL IJ SOAJ injection As dirrected (Patient taking differently: Inject 0.3 mg into the muscle once. ) 1 Device 3 More than a month at Unknown time  . fluticasone (FLONASE) 50 MCG/ACT nasal spray Place 2 sprays into both nostrils daily. (Patient taking differently: Place  1 spray into both nostrils every evening. ) 58 g 3 05/31/2017 at 2100  . loperamide (IMODIUM A-D) 2 MG tablet Take 2 mg by mouth 4 (four) times daily as needed for diarrhea or loose stools. Will take one by mouth 82mn before meals or two as needed.   More than a month at Unknown time  . losartan (COZAAR) 100 MG tablet Take 1 tablet (100 mg total) by mouth daily. 90 tablet 3 06/01/2017 at 0700  . traMADol (ULTRAM) 50 MG tablet Take 1-2 tablets (50-100 mg total) by mouth every 12 (twelve) hours as needed for moderate pain. 360 tablet 1 05/31/2017 at 1600  . triamcinolone ointment (KENALOG) 0.5 % Apply topically 4 (four) times daily. (Patient taking differently: Apply 1 application topically 4 (four) times daily as needed (for hemmoroid). ) 30 g 3   . valACYclovir (VALTREX) 1000 MG tablet Take 1 tablet (1,000 mg total) by mouth 2 (two) times daily. 180 tablet 3 05/31/2017 at 2100  . zolpidem (AMBIEN) 10 MG tablet 1 & 1/2 TABLETS (15MG) BY MOUTH AT BEDTIME AS NEEDED FOR SLEEP, OK TOREPEAT IN 4HRS (Patient taking differently: Take 10-15 mg by mouth at bedtime as needed for sleep (May take additional 10 mg if needed). ) 135 tablet 1  05/31/2017 at 2100  . acyclovir ointment (ZOVIRAX) 5 % Apply 1 application topically daily as needed (for outbreak). (Patient not taking: Reported on 05/20/2017) 15 g 2 Completed Course at Unknown time  . enoxaparin (LOVENOX) 80 MG/0.8ML injection Inject 80 mg into the skin daily.     . fish oil-omega-3 fatty acids 1000 MG capsule Take 2 g by mouth daily.    05/24/2017  . hydrochlorothiazide (MICROZIDE) 12.5 MG capsule Take 1 capsule (12.5 mg total) by mouth daily. (Patient not taking: Reported on 05/20/2017) 90 capsule 3 Not Taking at Unknown time  . warfarin (COUMADIN) 5 MG tablet Take 2 tablets on Mondays of every week. All other days--take 1&1/2 tablets. (Patient taking differently: Take 7.5-10 mg by mouth See admin instructions. Take 10 mg by mouth daily on Monday and Friday. Take 7.5 mg by mouth daily on all other days) 135 tablet 3 05/28/2017   Allergies  Allergen Reactions  . Bee Venom Anaphylaxis and Other (See Comments)    Yellow jackets, wasps as well  . Ivp Dye [Iodinated Diagnostic Agents] Anaphylaxis  . Shellfish Allergy Anaphylaxis  . Amlodipine Besylate Swelling  . Aspirin Other (See Comments)    REACTION: unspecified  . Atorvastatin Diarrhea  . Cholestyramine Other (See Comments)    REACTION: mouth irritation  . Codeine Phosphate Nausea And Vomiting  . Demerol [Meperidine] Other (See Comments)    Severe Hallucination!!!!  . Diphenhydramine Hcl Other (See Comments)    hyperactive  . Doxycycline Nausea Only  . Gentamicin Sulfate Other (See Comments)    REACTION: unspecified  . Meclizine Hcl Other (See Comments)    REACTION: more dizziness on it  . Meperidine Hcl Other (See Comments)    Unknown  . Metronidazole Other (See Comments)    REACTION: bad taste  . Moxifloxacin Other (See Comments)    REACTION: insomnia Able to use Cipro  . Sulfamethoxazole Other (See Comments)    Kidney problems  . Penicillins Swelling, Rash and Other (See Comments)    Has patient had a PCN  reaction causing immediate rash, facial/tongue/throat swelling, SOB or lightheadedness with hypotension: Yes Has patient had a PCN reaction causing severe rash involving mucus membranes or skin necrosis: No Has patient  had a PCN reaction that required hospitalization: No Has patient had a PCN reaction occurring within the last 10 years: No If all of the above answers are "NO", then may proceed with Cephalosporin use.     Social History   Tobacco Use  . Smoking status: Never Smoker  . Smokeless tobacco: Never Used  Substance Use Topics  . Alcohol use: No    Alcohol/week: 0.0 oz    Family History  Problem Relation Age of Onset  . Breast cancer Sister   . Kidney disease Sister 53       nephritis  . Parkinsonism Mother   . Heart disease Brother   . Osteoarthritis Other   . Ulcerative colitis Sister   . Colon cancer Neg Hx      Review of Systems  Constitutional: Negative.   HENT: Negative.   Eyes: Negative.   Respiratory: Negative.   Cardiovascular: Negative.   Gastrointestinal: Positive for diarrhea and heartburn.  Genitourinary: Positive for frequency.  Musculoskeletal: Positive for joint pain.  Skin: Negative.   Neurological: Negative.   Endo/Heme/Allergies: Positive for environmental allergies.  Psychiatric/Behavioral: Negative.     Objective:  Physical Exam  Constitutional: She is oriented to person, place, and time. She appears well-developed.  HENT:  Head: Normocephalic.  Eyes: Pupils are equal, round, and reactive to light.  Neck: Neck supple. No JVD present. No tracheal deviation present. No thyromegaly present.  Cardiovascular: Normal rate, regular rhythm and intact distal pulses.  Murmur heard. Respiratory: Effort normal and breath sounds normal. No respiratory distress. She has no wheezes.  GI: Soft. There is no tenderness. There is no guarding.  Musculoskeletal:       Right hip: She exhibits decreased range of motion, decreased strength, tenderness,  bony tenderness and swelling.  Lymphadenopathy:    She has no cervical adenopathy.  Neurological: She is alert and oriented to person, place, and time. A sensory deficit (bilateral LE neuropathy) is present.  Skin: Skin is warm and dry.  Psychiatric: She has a normal mood and affect.      Labs:  Estimated body mass index is 20.14 kg/m as calculated from the following:   Height as of 06/01/17: 5' 5"  (1.651 m).   Weight as of 06/01/17: 54.9 kg (121 lb).   Imaging Review Plain radiographs demonstrate severe degenerative joint disease of the right hip(s). The bone quality appears to be good for age and reported activity level.  Assessment/Plan:  End stage arthritis, right hip(s)  The patient history, physical examination, clinical judgement of the provider and imaging studies are consistent with end stage degenerative joint disease of the right hip(s) and total hip arthroplasty is deemed medically necessary. The treatment options including medical management, injection therapy, arthroscopy and arthroplasty were discussed at length. The risks and benefits of total hip arthroplasty were presented and reviewed. The risks due to aseptic loosening, infection, stiffness, dislocation/subluxation,  thromboembolic complications and other imponderables were discussed.  The patient acknowledged the explanation, agreed to proceed with the plan and consent was signed. Patient is being admitted for inpatient treatment for surgery, pain control, PT, OT, prophylactic antibiotics, VTE prophylaxis, progressive ambulation and ADL's and discharge planning.The patient is planning to be discharged to skilled nursing facility.    [place] Pugh Law Corsino   PA-C  06/14/2017, 7:34 PM

## 2017-06-15 ENCOUNTER — Inpatient Hospital Stay (HOSPITAL_COMMUNITY): Payer: 59

## 2017-06-15 ENCOUNTER — Encounter (HOSPITAL_COMMUNITY)
Admission: RE | Disposition: A | Discharge status: Home Health | Payer: Self-pay | Source: Ambulatory Visit | Attending: Orthopedic Surgery

## 2017-06-15 ENCOUNTER — Other Ambulatory Visit: Payer: Self-pay

## 2017-06-15 ENCOUNTER — Inpatient Hospital Stay (HOSPITAL_COMMUNITY): Payer: 59 | Admitting: Registered Nurse

## 2017-06-15 ENCOUNTER — Inpatient Hospital Stay (HOSPITAL_COMMUNITY)
Admission: RE | Admit: 2017-06-15 | Discharge: 2017-06-18 | DRG: 470 | Disposition: A | Discharge status: Home Health | Payer: 59 | Source: Ambulatory Visit | Attending: Orthopedic Surgery | Admitting: Orthopedic Surgery

## 2017-06-15 ENCOUNTER — Encounter (HOSPITAL_COMMUNITY): Payer: Self-pay | Admitting: Registered Nurse

## 2017-06-15 DIAGNOSIS — M67853 Other specified disorders of tendon, right hip: Secondary | ICD-10-CM | POA: Diagnosis present

## 2017-06-15 DIAGNOSIS — M1611 Unilateral primary osteoarthritis, right hip: Principal | ICD-10-CM | POA: Diagnosis present

## 2017-06-15 DIAGNOSIS — Z471 Aftercare following joint replacement surgery: Secondary | ICD-10-CM | POA: Diagnosis not present

## 2017-06-15 DIAGNOSIS — Z91030 Bee allergy status: Secondary | ICD-10-CM

## 2017-06-15 DIAGNOSIS — Z85831 Personal history of malignant neoplasm of soft tissue: Secondary | ICD-10-CM | POA: Diagnosis not present

## 2017-06-15 DIAGNOSIS — Z7901 Long term (current) use of anticoagulants: Secondary | ICD-10-CM | POA: Diagnosis not present

## 2017-06-15 DIAGNOSIS — I739 Peripheral vascular disease, unspecified: Secondary | ICD-10-CM | POA: Diagnosis present

## 2017-06-15 DIAGNOSIS — S76312A Strain of muscle, fascia and tendon of the posterior muscle group at thigh level, left thigh, initial encounter: Secondary | ICD-10-CM | POA: Diagnosis not present

## 2017-06-15 DIAGNOSIS — D62 Acute posthemorrhagic anemia: Secondary | ICD-10-CM | POA: Diagnosis not present

## 2017-06-15 DIAGNOSIS — Z880 Allergy status to penicillin: Secondary | ICD-10-CM | POA: Diagnosis not present

## 2017-06-15 DIAGNOSIS — Z803 Family history of malignant neoplasm of breast: Secondary | ICD-10-CM

## 2017-06-15 DIAGNOSIS — Z96641 Presence of right artificial hip joint: Secondary | ICD-10-CM

## 2017-06-15 DIAGNOSIS — M7071 Other bursitis of hip, right hip: Secondary | ICD-10-CM | POA: Diagnosis present

## 2017-06-15 DIAGNOSIS — I10 Essential (primary) hypertension: Secondary | ICD-10-CM | POA: Diagnosis present

## 2017-06-15 DIAGNOSIS — K589 Irritable bowel syndrome without diarrhea: Secondary | ICD-10-CM | POA: Diagnosis present

## 2017-06-15 DIAGNOSIS — Z86718 Personal history of other venous thrombosis and embolism: Secondary | ICD-10-CM | POA: Diagnosis not present

## 2017-06-15 DIAGNOSIS — M810 Age-related osteoporosis without current pathological fracture: Secondary | ICD-10-CM | POA: Diagnosis present

## 2017-06-15 DIAGNOSIS — Z841 Family history of disorders of kidney and ureter: Secondary | ICD-10-CM

## 2017-06-15 DIAGNOSIS — Z8249 Family history of ischemic heart disease and other diseases of the circulatory system: Secondary | ICD-10-CM

## 2017-06-15 DIAGNOSIS — Z91013 Allergy to seafood: Secondary | ICD-10-CM

## 2017-06-15 DIAGNOSIS — Z820 Family history of epilepsy and other diseases of the nervous system: Secondary | ICD-10-CM

## 2017-06-15 DIAGNOSIS — M7061 Trochanteric bursitis, right hip: Secondary | ICD-10-CM | POA: Diagnosis not present

## 2017-06-15 DIAGNOSIS — Z96649 Presence of unspecified artificial hip joint: Secondary | ICD-10-CM

## 2017-06-15 HISTORY — PX: OPEN SURGICAL REPAIR OF GLUTEAL TENDON: SHX5995

## 2017-06-15 HISTORY — PX: TOTAL HIP ARTHROPLASTY: SHX124

## 2017-06-15 LAB — PROTIME-INR
INR: 1
PROTHROMBIN TIME: 13.1 s (ref 11.4–15.2)

## 2017-06-15 LAB — CBC
HEMATOCRIT: 26.7 % — AB (ref 36.0–46.0)
HEMOGLOBIN: 9.1 g/dL — AB (ref 12.0–15.0)
MCH: 33.8 pg (ref 26.0–34.0)
MCHC: 34.1 g/dL (ref 30.0–36.0)
MCV: 99.3 fL (ref 78.0–100.0)
Platelets: 273 10*3/uL (ref 150–400)
RBC: 2.69 MIL/uL — AB (ref 3.87–5.11)
RDW: 14.5 % (ref 11.5–15.5)
WBC: 11.8 10*3/uL — ABNORMAL HIGH (ref 4.0–10.5)

## 2017-06-15 LAB — TYPE AND SCREEN
ABO/RH(D): AB POS
Antibody Screen: NEGATIVE

## 2017-06-15 LAB — CREATININE, SERUM
CREATININE: 0.71 mg/dL (ref 0.44–1.00)
GFR calc Af Amer: >60 mL/min (ref >60)
GFR calc non Af Amer: >60 mL/min (ref >60)

## 2017-06-15 SURGERY — ARTHROPLASTY, HIP, TOTAL,POSTERIOR APPROACH
Anesthesia: General | Site: Hip | Laterality: Right

## 2017-06-15 MED ORDER — ZOLPIDEM TARTRATE 5 MG PO TABS
5.0000 mg | ORAL_TABLET | Freq: Every evening | ORAL | Status: DC | PRN
  Administered 2017-06-16: 7.5 mg via ORAL
  Filled 2017-06-15: qty 2

## 2017-06-15 MED ORDER — WARFARIN SODIUM 5 MG PO TABS
7.5000 mg | ORAL_TABLET | Freq: Once | ORAL | Status: AC
  Administered 2017-06-15: 7.5 mg via ORAL
  Filled 2017-06-15: qty 1

## 2017-06-15 MED ORDER — METOCLOPRAMIDE HCL 5 MG PO TABS: 5.0000 mg | ORAL_TABLET | Freq: Three times a day (TID) | ORAL | Status: DC | PRN

## 2017-06-15 MED ORDER — DOCUSATE SODIUM 100 MG PO CAPS
100.0000 mg | ORAL_CAPSULE | Freq: Two times a day (BID) | ORAL | Status: DC
  Administered 2017-06-15 – 2017-06-18 (×6): 100 mg via ORAL
  Filled 2017-06-15 (×6): qty 1

## 2017-06-15 MED ORDER — VANCOMYCIN HCL IN DEXTROSE 1-5 GM/200ML-% IV SOLN
1000.0000 mg | Freq: Two times a day (BID) | INTRAVENOUS | Status: AC
  Administered 2017-06-16: 1000 mg via INTRAVENOUS
  Filled 2017-06-15: qty 200

## 2017-06-15 MED ORDER — TRANEXAMIC ACID 1000 MG/10ML IV SOLN
INTRAVENOUS | Status: AC | PRN
  Administered 2017-06-15: 2000 mg via TOPICAL

## 2017-06-15 MED ORDER — METOCLOPRAMIDE HCL 5 MG/ML IJ SOLN: 10.0000 mg | Freq: Once | INTRAMUSCULAR | Status: DC | PRN

## 2017-06-15 MED ORDER — LOSARTAN POTASSIUM 50 MG PO TABS
100.0000 mg | ORAL_TABLET | Freq: Every day | ORAL | Status: DC
  Administered 2017-06-18: 11:00:00 100 mg via ORAL
  Filled 2017-06-15: qty 2

## 2017-06-15 MED ORDER — ONDANSETRON HCL 4 MG PO TABS: 4.0000 mg | ORAL_TABLET | Freq: Four times a day (QID) | ORAL | Status: DC | PRN

## 2017-06-15 MED ORDER — LACTATED RINGERS IV SOLN
INTRAVENOUS | Status: DC
  Administered 2017-06-15 (×2): via INTRAVENOUS

## 2017-06-15 MED ORDER — SODIUM CHLORIDE 0.9 % IV SOLN
INTRAVENOUS | Status: DC
Start: 2017-06-15 — End: 2017-06-18
  Administered 2017-06-16: 02:00:00 via INTRAVENOUS

## 2017-06-15 MED ORDER — ONDANSETRON HCL 4 MG/2ML IJ SOLN
INTRAMUSCULAR | Status: DC | PRN
  Administered 2017-06-15: 4 mg via INTRAVENOUS

## 2017-06-15 MED ORDER — PHENOL 1.4 % MT LIQD: 1.0000 | OROMUCOSAL | Status: DC | PRN

## 2017-06-15 MED ORDER — HYDROMORPHONE HCL 1 MG/ML IJ SOLN
0.5000 mg | INTRAMUSCULAR | Status: AC | PRN
  Administered 2017-06-15 (×2): 0.5 mg via INTRAVENOUS

## 2017-06-15 MED ORDER — VALACYCLOVIR HCL 500 MG PO TABS: 1000.0000 mg | ORAL_TABLET | Freq: Two times a day (BID) | ORAL | Status: DC

## 2017-06-15 MED ORDER — FENTANYL CITRATE (PF) 250 MCG/5ML IJ SOLN
INTRAMUSCULAR | Status: DC | PRN
  Administered 2017-06-15 (×2): 50 ug via INTRAVENOUS
  Administered 2017-06-15: 25 ug via INTRAVENOUS
  Administered 2017-06-15: 50 ug via INTRAVENOUS
  Administered 2017-06-15 (×3): 25 ug via INTRAVENOUS

## 2017-06-15 MED ORDER — VANCOMYCIN HCL IN DEXTROSE 1-5 GM/200ML-% IV SOLN
INTRAVENOUS | Status: AC
  Administered 2017-06-15: 1000 mg via INTRAVENOUS
  Filled 2017-06-15: qty 200

## 2017-06-15 MED ORDER — DEXAMETHASONE SODIUM PHOSPHATE 10 MG/ML IJ SOLN
10.0000 mg | Freq: Once | INTRAMUSCULAR | Status: AC
  Administered 2017-06-16: 10 mg via INTRAVENOUS
  Filled 2017-06-15: qty 1

## 2017-06-15 MED ORDER — WARFARIN - PHARMACIST DOSING INPATIENT
Freq: Every day | Status: DC
  Administered 2017-06-15 – 2017-06-16 (×2)

## 2017-06-15 MED ORDER — DEXAMETHASONE SODIUM PHOSPHATE 10 MG/ML IJ SOLN
INTRAMUSCULAR | Status: AC
  Filled 2017-06-15: qty 1

## 2017-06-15 MED ORDER — METHOCARBAMOL 1000 MG/10ML IJ SOLN
500.0000 mg | Freq: Four times a day (QID) | INTRAVENOUS | Status: DC | PRN
  Administered 2017-06-15: 500 mg via INTRAVENOUS
  Filled 2017-06-15: qty 550

## 2017-06-15 MED ORDER — ONDANSETRON HCL 4 MG/2ML IJ SOLN: 4.0000 mg | Freq: Four times a day (QID) | INTRAMUSCULAR | Status: DC | PRN

## 2017-06-15 MED ORDER — MENTHOL 3 MG MT LOZG: 1.0000 | LOZENGE | OROMUCOSAL | Status: DC | PRN

## 2017-06-15 MED ORDER — ROCURONIUM BROMIDE 10 MG/ML (PF) SYRINGE
PREFILLED_SYRINGE | INTRAVENOUS | Status: DC | PRN
  Administered 2017-06-15: 10 mg via INTRAVENOUS
  Administered 2017-06-15: 50 mg via INTRAVENOUS

## 2017-06-15 MED ORDER — ROCURONIUM BROMIDE 50 MG/5ML IV SOSY
PREFILLED_SYRINGE | INTRAVENOUS | Status: AC
  Filled 2017-06-15: qty 5

## 2017-06-15 MED ORDER — METHOCARBAMOL 500 MG PO TABS
500.0000 mg | ORAL_TABLET | Freq: Four times a day (QID) | ORAL | Status: DC | PRN
  Administered 2017-06-16 – 2017-06-18 (×6): 500 mg via ORAL
  Filled 2017-06-15 (×6): qty 1

## 2017-06-15 MED ORDER — HYDROXYZINE HCL 25 MG PO TABS: 25.0000 mg | ORAL_TABLET | Freq: Three times a day (TID) | ORAL | Status: DC | PRN

## 2017-06-15 MED ORDER — PROPOFOL 10 MG/ML IV BOLUS
INTRAVENOUS | Status: DC | PRN
  Administered 2017-06-15: 70 mg via INTRAVENOUS

## 2017-06-15 MED ORDER — DEXAMETHASONE SODIUM PHOSPHATE 10 MG/ML IJ SOLN
10.0000 mg | Freq: Once | INTRAMUSCULAR | Status: AC
  Administered 2017-06-15: 10 mg via INTRAVENOUS

## 2017-06-15 MED ORDER — HYDROMORPHONE HCL 1 MG/ML IJ SOLN
0.5000 mg | INTRAMUSCULAR | Status: DC | PRN
  Administered 2017-06-15: 1 mg via INTRAVENOUS
  Filled 2017-06-15: qty 1

## 2017-06-15 MED ORDER — ONDANSETRON HCL 4 MG/2ML IJ SOLN
INTRAMUSCULAR | Status: AC
  Filled 2017-06-15: qty 2

## 2017-06-15 MED ORDER — PHENYLEPHRINE 40 MCG/ML (10ML) SYRINGE FOR IV PUSH (FOR BLOOD PRESSURE SUPPORT)
PREFILLED_SYRINGE | INTRAVENOUS | Status: DC | PRN
  Administered 2017-06-15: 80 ug via INTRAVENOUS

## 2017-06-15 MED ORDER — FENTANYL CITRATE (PF) 100 MCG/2ML IJ SOLN
INTRAMUSCULAR | Status: AC
Start: 2017-06-15 — End: 2017-06-15
  Administered 2017-06-15: 50 ug via INTRAVENOUS
  Filled 2017-06-15: qty 2

## 2017-06-15 MED ORDER — VANCOMYCIN HCL IN DEXTROSE 1-5 GM/200ML-% IV SOLN
1000.0000 mg | INTRAVENOUS | Status: AC
  Administered 2017-06-15: 1000 mg via INTRAVENOUS

## 2017-06-15 MED ORDER — PHENYLEPHRINE 40 MCG/ML (10ML) SYRINGE FOR IV PUSH (FOR BLOOD PRESSURE SUPPORT)
PREFILLED_SYRINGE | INTRAVENOUS | Status: AC
  Filled 2017-06-15: qty 10

## 2017-06-15 MED ORDER — POLYETHYLENE GLYCOL 3350 17 G PO PACK
17.0000 g | PACK | Freq: Two times a day (BID) | ORAL | Status: DC
  Administered 2017-06-15 – 2017-06-18 (×4): 17 g via ORAL
  Filled 2017-06-15 (×4): qty 1

## 2017-06-15 MED ORDER — SUGAMMADEX SODIUM 200 MG/2ML IV SOLN
INTRAVENOUS | Status: DC | PRN
  Administered 2017-06-15: 100 mg via INTRAVENOUS

## 2017-06-15 MED ORDER — ALBUMIN HUMAN 5 % IV SOLN
INTRAVENOUS | Status: AC
  Filled 2017-06-15: qty 250

## 2017-06-15 MED ORDER — ALUM & MAG HYDROXIDE-SIMETH 200-200-20 MG/5ML PO SUSP: 15.0000 mL | ORAL | Status: DC | PRN

## 2017-06-15 MED ORDER — PROPOFOL 10 MG/ML IV BOLUS
INTRAVENOUS | Status: AC
  Filled 2017-06-15: qty 20

## 2017-06-15 MED ORDER — BISACODYL 10 MG RE SUPP: 10.0000 mg | Freq: Every day | RECTAL | Status: DC | PRN

## 2017-06-15 MED ORDER — CHLORHEXIDINE GLUCONATE 4 % EX LIQD: 60.0000 mL | Freq: Once | CUTANEOUS | Status: DC

## 2017-06-15 MED ORDER — HYDROMORPHONE HCL 1 MG/ML IJ SOLN
INTRAMUSCULAR | Status: AC
  Administered 2017-06-15: 0.5 mg via INTRAVENOUS
  Filled 2017-06-15: qty 1

## 2017-06-15 MED ORDER — HYDROMORPHONE HCL 1 MG/ML IJ SOLN
INTRAMUSCULAR | Status: AC
Start: 2017-06-15 — End: 2017-06-15
  Administered 2017-06-15: 0.5 mg via INTRAVENOUS
  Filled 2017-06-15: qty 1

## 2017-06-15 MED ORDER — FENTANYL CITRATE (PF) 250 MCG/5ML IJ SOLN
INTRAMUSCULAR | Status: AC
  Filled 2017-06-15: qty 5

## 2017-06-15 MED ORDER — FENTANYL CITRATE (PF) 100 MCG/2ML IJ SOLN
25.0000 ug | INTRAMUSCULAR | Status: DC | PRN
  Administered 2017-06-15 (×2): 50 ug via INTRAVENOUS

## 2017-06-15 MED ORDER — 0.9 % SODIUM CHLORIDE (POUR BTL) OPTIME
TOPICAL | Status: DC | PRN
  Administered 2017-06-15: 2000 mL

## 2017-06-15 MED ORDER — ACETAMINOPHEN 325 MG PO TABS: 650.0000 mg | ORAL_TABLET | ORAL | Status: DC | PRN

## 2017-06-15 MED ORDER — FLUTICASONE PROPIONATE 50 MCG/ACT NA SUSP
1.0000 | Freq: Every evening | NASAL | Status: DC
  Administered 2017-06-16: 1 via NASAL
  Filled 2017-06-15: qty 16

## 2017-06-15 MED ORDER — METOCLOPRAMIDE HCL 5 MG/ML IJ SOLN: 5.0000 mg | Freq: Three times a day (TID) | INTRAMUSCULAR | Status: DC | PRN

## 2017-06-15 MED ORDER — FERROUS SULFATE 325 (65 FE) MG PO TABS
325.0000 mg | ORAL_TABLET | Freq: Three times a day (TID) | ORAL | Status: DC
  Administered 2017-06-16 – 2017-06-18 (×8): 325 mg via ORAL
  Filled 2017-06-15 (×8): qty 1

## 2017-06-15 MED ORDER — HYDROCODONE-ACETAMINOPHEN 7.5-325 MG PO TABS
1.0000 | ORAL_TABLET | ORAL | Status: DC | PRN
Start: 2017-06-15 — End: 2017-06-18
  Administered 2017-06-15: 19:00:00 1 via ORAL
  Filled 2017-06-15: qty 1

## 2017-06-15 MED ORDER — LIDOCAINE 2% (20 MG/ML) 5 ML SYRINGE
INTRAMUSCULAR | Status: AC
  Filled 2017-06-15: qty 5

## 2017-06-15 MED ORDER — ACETAMINOPHEN 650 MG RE SUPP: 650.0000 mg | RECTAL | Status: DC | PRN

## 2017-06-15 MED ORDER — TRANEXAMIC ACID 1000 MG/10ML IV SOLN
2000.0000 mg | Freq: Once | INTRAVENOUS | Status: DC
  Filled 2017-06-15: qty 20

## 2017-06-15 MED ORDER — ENOXAPARIN SODIUM 40 MG/0.4ML ~~LOC~~ SOLN
40.0000 mg | Freq: Two times a day (BID) | SUBCUTANEOUS | Status: DC
  Administered 2017-06-16 – 2017-06-18 (×5): 40 mg via SUBCUTANEOUS
  Filled 2017-06-15 (×5): qty 0.4

## 2017-06-15 MED ORDER — MAGNESIUM CITRATE PO SOLN: 1.0000 | Freq: Once | ORAL | Status: DC | PRN

## 2017-06-15 MED ORDER — LIDOCAINE 2% (20 MG/ML) 5 ML SYRINGE
INTRAMUSCULAR | Status: DC | PRN
  Administered 2017-06-15: 100 mg via INTRAVENOUS

## 2017-06-15 MED ORDER — HYDROCODONE-ACETAMINOPHEN 7.5-325 MG PO TABS
2.0000 | ORAL_TABLET | ORAL | Status: DC | PRN
  Administered 2017-06-15 – 2017-06-18 (×15): 2 via ORAL
  Filled 2017-06-15 (×15): qty 2

## 2017-06-15 MED ORDER — ALBUMIN HUMAN 5 % IV SOLN
INTRAVENOUS | Status: DC | PRN
  Administered 2017-06-15: 16:00:00 via INTRAVENOUS

## 2017-06-15 SURGICAL SUPPLY — 69 items
BAG DECANTER FOR FLEXI CONT (MISCELLANEOUS) ×2 IMPLANT
BAG ZIPLOCK 12X15 (MISCELLANEOUS) ×2 IMPLANT
BIT DRILL 2.8X128 (BIT) ×2 IMPLANT
BLADE SAW SGTL 11.0X1.19X90.0M (BLADE) IMPLANT
BLADE SAW SGTL 18X1.27X75 (BLADE) ×2 IMPLANT
CAPT HIP TOTAL 2 ×2 IMPLANT
COVER SURGICAL LIGHT HANDLE (MISCELLANEOUS) ×2 IMPLANT
DERMABOND ADVANCED (GAUZE/BANDAGES/DRESSINGS) ×1
DERMABOND ADVANCED .7 DNX12 (GAUZE/BANDAGES/DRESSINGS) ×1 IMPLANT
DRAPE ORTHO SPLIT 77X108 STRL (DRAPES) ×2
DRAPE POUCH INSTRU U-SHP 10X18 (DRAPES) ×2 IMPLANT
DRAPE SURG 17X11 SM STRL (DRAPES) ×2 IMPLANT
DRAPE SURG ORHT 6 SPLT 77X108 (DRAPES) ×2 IMPLANT
DRAPE U-SHAPE 47X51 STRL (DRAPES) ×2 IMPLANT
DRESSING AQUACEL AG SP 3.5X10 (GAUZE/BANDAGES/DRESSINGS) ×1 IMPLANT
DRSG AQUACEL AG ADV 3.5X10 (GAUZE/BANDAGES/DRESSINGS) ×2 IMPLANT
DRSG AQUACEL AG SP 3.5X10 (GAUZE/BANDAGES/DRESSINGS) ×2
DRSG EMULSION OIL 3X16 NADH (GAUZE/BANDAGES/DRESSINGS) ×2 IMPLANT
DRSG MEPILEX BORDER 4X4 (GAUZE/BANDAGES/DRESSINGS) ×2 IMPLANT
DRSG MEPILEX BORDER 4X8 (GAUZE/BANDAGES/DRESSINGS) ×2 IMPLANT
DRSG PAD ABDOMINAL 8X10 ST (GAUZE/BANDAGES/DRESSINGS) ×2 IMPLANT
DURAPREP 26ML APPLICATOR (WOUND CARE) ×2 IMPLANT
ELECT BLADE TIP CTD 4 INCH (ELECTRODE) ×2 IMPLANT
ELECT REM PT RETURN 15FT ADLT (MISCELLANEOUS) ×2 IMPLANT
FACESHIELD WRAPAROUND (MASK) ×8 IMPLANT
GAUZE SPONGE 4X4 12PLY STRL (GAUZE/BANDAGES/DRESSINGS) ×2 IMPLANT
GLOVE BIOGEL M 7.0 STRL (GLOVE) IMPLANT
GLOVE BIOGEL PI IND STRL 7.5 (GLOVE) ×1 IMPLANT
GLOVE BIOGEL PI IND STRL 8.5 (GLOVE) ×1 IMPLANT
GLOVE BIOGEL PI INDICATOR 7.5 (GLOVE) ×1
GLOVE BIOGEL PI INDICATOR 8.5 (GLOVE) ×1
GLOVE ECLIPSE 8.0 STRL XLNG CF (GLOVE) ×2 IMPLANT
GLOVE ORTHO TXT STRL SZ7.5 (GLOVE) ×4 IMPLANT
GLOVE SURG ORTHO 8.0 STRL STRW (GLOVE) ×2 IMPLANT
GOWN STRL REUS W/TWL LRG LVL3 (GOWN DISPOSABLE) ×4 IMPLANT
GOWN STRL REUS W/TWL XL LVL3 (GOWN DISPOSABLE) ×4 IMPLANT
KIT BASIN OR (CUSTOM PROCEDURE TRAY) ×2 IMPLANT
MANIFOLD NEPTUNE II (INSTRUMENTS) ×2 IMPLANT
MARKER SKIN DUAL TIP RULER LAB (MISCELLANEOUS) ×2 IMPLANT
NDL SAFETY ECLIPSE 18X1.5 (NEEDLE) ×1 IMPLANT
NEEDLE HYPO 18GX1.5 SHARP (NEEDLE) ×1
NEEDLE MA TROC 1/2 (NEEDLE) ×2 IMPLANT
NS IRRIG 1000ML POUR BTL (IV SOLUTION) ×2 IMPLANT
PACK TOTAL JOINT (CUSTOM PROCEDURE TRAY) ×2 IMPLANT
PADDING CAST COTTON 6X4 STRL (CAST SUPPLIES) ×2 IMPLANT
PASSER SUT SWANSON 36MM LOOP (INSTRUMENTS) ×2 IMPLANT
PINN ALTRX NEUT ID X OD 32X48 ×2 IMPLANT
POSITIONER SURGICAL ARM (MISCELLANEOUS) ×2 IMPLANT
SCREW 6.5MMX35MM (Screw) ×2 IMPLANT
STAPLER VISISTAT (STAPLE) IMPLANT
SUCTION FRAZIER HANDLE 10FR (MISCELLANEOUS) ×1
SUCTION FRAZIER HANDLE 12FR (TUBING) ×1
SUCTION TUBE FRAZIER 10FR DISP (MISCELLANEOUS) ×1 IMPLANT
SUCTION TUBE FRAZIER 12FR DISP (TUBING) ×1 IMPLANT
SUT ETHIBOND NAB CT1 #1 30IN (SUTURE) ×4 IMPLANT
SUT FIBERWIRE #2 38 T-5 BLUE (SUTURE)
SUT MNCRL AB 4-0 PS2 18 (SUTURE) ×2 IMPLANT
SUT VIC AB 1 CT1 27 (SUTURE) ×3
SUT VIC AB 1 CT1 27XBRD ANTBC (SUTURE) ×3 IMPLANT
SUT VIC AB 1 CT1 36 (SUTURE) ×6 IMPLANT
SUT VIC AB 2-0 CT1 27 (SUTURE) ×2
SUT VIC AB 2-0 CT1 TAPERPNT 27 (SUTURE) ×2 IMPLANT
SUT VLOC 180 0 24IN GS25 (SUTURE) ×4 IMPLANT
SUTURE FIBERWR #2 38 T-5 BLUE (SUTURE) IMPLANT
SYR 50ML LL SCALE MARK (SYRINGE) ×2 IMPLANT
TOWEL OR 17X26 10 PK STRL BLUE (TOWEL DISPOSABLE) ×4 IMPLANT
TRAY FOLEY W/METER SILVER 16FR (SET/KITS/TRAYS/PACK) ×2 IMPLANT
WATER STERILE IRR 1000ML POUR (IV SOLUTION) ×2 IMPLANT
YANKAUER SUCT BULB TIP 10FT TU (MISCELLANEOUS) ×2 IMPLANT

## 2017-06-15 NOTE — Transfer of Care (Signed)
Immediate Anesthesia Transfer of Care Note  Patient: Tracy Peters  Procedure(s) Performed: Right total hip arthroplasty, bursectomy, repair of gluteal tendon, posterior approach (Right Hip) OPEN SURGICAL REPAIR OF GLUTEALmedius TENDON (Right Hip)  Patient Location: PACU  Anesthesia Type:General  Level of Consciousness: awake, alert  and patient cooperative  Airway & Oxygen Therapy: Patient Spontanous Breathing and Patient connected to face mask  Post-op Assessment: Report given to RN and Post -op Vital signs reviewed and stable  Post vital signs: Reviewed and stable  Last Vitals:  Vitals:   06/15/17 1256  BP: (!) 165/94  Pulse: 84  Resp: 18  SpO2: 100%    Last Pain:  Vitals:   06/15/17 1323  TempSrc:   PainSc: 5          Complications: No apparent anesthesia complications

## 2017-06-15 NOTE — Progress Notes (Signed)
ANTICOAGULATION CONSULT NOTE - Initial Consult  Pharmacy Consult for warfarin Indication: DVT, s/p THA  Allergies  Allergen Reactions  . Bee Venom Anaphylaxis and Other (See Comments)    Yellow jackets, wasps as well  . Ivp Dye [Iodinated Diagnostic Agents] Anaphylaxis  . Shellfish Allergy Anaphylaxis  . Amlodipine Besylate Swelling  . Aspirin Other (See Comments)    REACTION: unspecified  . Atorvastatin Diarrhea  . Cholestyramine Other (See Comments)    REACTION: mouth irritation  . Codeine Phosphate Nausea And Vomiting  . Demerol [Meperidine] Other (See Comments)    Severe Hallucination!!!!  . Diphenhydramine Hcl Other (See Comments)    hyperactive  . Doxycycline Nausea Only  . Gentamicin Sulfate Other (See Comments)    REACTION: unspecified  . Meclizine Hcl Other (See Comments)    REACTION: more dizziness on it  . Meperidine Hcl Other (See Comments)    Unknown  . Metronidazole Other (See Comments)    REACTION: bad taste  . Moxifloxacin Other (See Comments)    REACTION: insomnia Able to use Cipro  . Sulfamethoxazole Other (See Comments)    Kidney problems  . Penicillins Swelling, Rash and Other (See Comments)    Has patient had a PCN reaction causing immediate rash, facial/tongue/throat swelling, SOB or lightheadedness with hypotension: Yes Has patient had a PCN reaction causing severe rash involving mucus membranes or skin necrosis: No Has patient had a PCN reaction that required hospitalization: No Has patient had a PCN reaction occurring within the last 10 years: No If all of the above answers are "NO", then may proceed with Cephalosporin use.     Patient Measurements: Height: 5\' 5"  (165.1 cm) Weight: 121 lb (54.9 kg) IBW/kg (Calculated) : 57  Vital Signs: Temp: 97.8 F (36.6 C) (12/31 1830) Temp Source: Oral (12/31 1256) BP: 177/77 (12/31 1830) Pulse Rate: 74 (12/31 1830)  Labs: Recent Labs    06/15/17 1313  LABPROT 13.1  INR 1.00    Estimated  Creatinine Clearance: 46.6 mL/min (by C-G formula based on SCr of 0.82 mg/dL).   Medical History: Past Medical History:  Diagnosis Date  . Adenomatous colon polyp   . Allergic rhinitis    uses FLonase nightly  . Anemia   . Arthritis    joint pain  . Blood transfusion   . Bronchitis    hx of-7-81yrs ago  . Carpal tunnel syndrome    numbness and tingling   . Cataracts, bilateral   . Chronic anticoagulation 03/21/2013  . Congenital absence of one kidney   . Diarrhea    takes Immodium daily as needed or Lomotil   . Diverticulosis   . GERD (gastroesophageal reflux disease)    mild  . Herpes    takes Valtrex daily  . History of colon polyps   . History of DVT (deep vein thrombosis) 1998   right arm  . History of staph infection   . Hyperlipidemia    Medical MD doesn't think its absolutely necessary  . Hypertension    takes Losartan daily  . IBS (irritable bowel syndrome)    takes Electronics engineer daily  . Insomnia    takes Ambien nightly as needed  . LBP (low back pain)   . Long term (current) use of anticoagulants    Dr. Beryle Beams  . Nocturia   . Osteoporosis   . Raynaud's disease   . 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 Winfield Cunas  . 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 Urinary leakage   . UTI (urinary tract infection)     Medications:  Scheduled:  . [START ON 06/16/2017] dexamethasone  10 mg Intravenous Once  . docusate sodium  100 mg Oral BID  . [START ON 06/16/2017] enoxaparin (LOVENOX) injection  80 mg Subcutaneous Q24H  . ferrous sulfate  325 mg Oral TID PC  . fluticasone  1 spray Each Nare QPM  . losartan  100 mg Oral Daily  . polyethylene glycol  17 g Oral BID  . valACYclovir  1,000 mg Oral BID   Infusions:  . sodium chloride    . methocarbamol (ROBAXIN)  IV 500 mg (06/15/17 1702)  . vancomycin      Assessment: 83 yo female admitted for L THA on warfarin PTA for hx VTE at a reported dose of 7.5mg   daily except 10mg  on Mondays and was taking Lovenox 80mg  q24 in preparation for the surgery with last dose yesterday 12/30.   Goal of Therapy:  INR 2-3 Monitor platelets by anticoagulation protocol: Yes   Plan:  1) Warfarin 7.5mg  PO x 1 tonight 2) Daily INR   Adrian Saran, PharmD, BCPS Pager [phone] 06/15/2017 6:49 PM

## 2017-06-15 NOTE — Interval H&P Note (Signed)
History and Physical Interval Note:  06/15/2017 1:24 PM  Tracy Peters  has presented today for surgery, with the diagnosis of Right hip osteoarthritis, degenerative labrum chronic bursitis, gluteus medius tendon tear  The various methods of treatment have been discussed with the patient and family. After consideration of risks, benefits and other options for treatment, the patient has consented to  Procedure(s) with comments: Right total hip arthroplasty, bursectomy, attemptend repair of gluteal tendon, posterior approach (Right) - 90 mins for all procedures together OPEN SURGICAL REPAIR OF GLUTEAL TENDON (Right) as a surgical intervention .  The patient's history has been reviewed, patient examined, no change in status, stable for surgery.  I have reviewed the patient's chart and labs.  Questions were answered to the patient's satisfaction.     Mauri Pole

## 2017-06-15 NOTE — Anesthesia Procedure Notes (Signed)
Procedure Name: Intubation Date/Time: 06/15/2017 2:46 PM Performed by: Talbot Grumbling, CRNA Pre-anesthesia Checklist: Patient identified, Emergency Drugs available, Suction available and Patient being monitored Patient Re-evaluated:Patient Re-evaluated prior to induction Oxygen Delivery Method: Circle system utilized Preoxygenation: Pre-oxygenation with 100% oxygen Induction Type: IV induction Ventilation: Mask ventilation without difficulty Laryngoscope Size: Miller and 2 Grade View: Grade I Tube type: Oral Tube size: 7.0 mm Number of attempts: 1 Airway Equipment and Method: Stylet Placement Confirmation: ETT inserted through vocal cords under direct vision,  positive ETCO2 and breath sounds checked- equal and bilateral Secured at: 21 cm Tube secured with: Tape Dental Injury: Teeth and Oropharynx as per pre-operative assessment

## 2017-06-15 NOTE — Brief Op Note (Signed)
06/15/2017  7:03 PM  PATIENT:  Tracy Peters  83 y.o. female  PRE-OPERATIVE DIAGNOSIS:  Right hip osteoarthritis, degenerative labrum, chronic bursitis, gluteus medius tendon tear  POST-OPERATIVE DIAGNOSIS:  Right hip osteoarthritis, degenerative labrum, chronic bursitis, gluteus medius tendon tear  PROCEDURE:  Procedure(s) with comments: Right total hip arthroplasty, bursectomy, repair of gluteal tendon, posterior approach (Right) - 90 mins for all procedures together OPEN SURGICAL REPAIR OF GLUTEALmedius TENDON (Right)  SURGEON:  Surgeon(s) and Role:    Paralee Cancel, MD - Primary  PHYSICIAN ASSISTANT: Danae Orleans, PA-C  ANESTHESIA:   general  EBL:  550 mL   BLOOD ADMINISTERED:none  DRAINS: none   LOCAL MEDICATIONS USED:  NONE  SPECIMEN:  No Specimen  DISPOSITION OF SPECIMEN:  N/A  COUNTS:  YES  TOURNIQUET:  * No tourniquets in log *  DICTATION: .Other Dictation: Dictation Number [id number]  PLAN OF CARE: Admit to inpatient   PATIENT DISPOSITION:  PACU - hemodynamically stable.   Delay start of Pharmacological VTE agent (>24hrs) due to surgical blood loss or risk of bleeding: no

## 2017-06-15 NOTE — Plan of Care (Signed)
Plan of care reviewed with patient and family

## 2017-06-15 NOTE — Anesthesia Preprocedure Evaluation (Signed)
Anesthesia Evaluation  Patient identified by MRN, date of birth, ID band Patient awake    Reviewed: Allergy & Precautions, H&P , NPO status , Patient's Chart, lab work & pertinent test results  Airway Mallampati: II  TM Distance: >3 FB Neck ROM: Full    Dental no notable dental hx.    Pulmonary neg pulmonary ROS,    Pulmonary exam normal breath sounds clear to auscultation       Cardiovascular hypertension, Pt. on medications + Peripheral Vascular Disease  Normal cardiovascular exam Rhythm:Regular Rate:Normal  ECG: NSR, rate 77   Neuro/Psych  Neuromuscular disease negative neurological ROS  negative psych ROS   GI/Hepatic negative GI ROS, Neg liver ROS,   Endo/Other  negative endocrine ROS  Renal/GU negative Renal ROS  negative genitourinary   Musculoskeletal negative musculoskeletal ROS (+) Arthritis , Right hip persistent bursitis Gluteus medius/minimus tears LBP (low back pain)   Abdominal   Peds negative pediatric ROS (+)  Hematology negative hematology ROS (+) anemia , History of DVT (deep vein thrombosis) Arterial thrombosis    Anesthesia Other Findings Right hip persistent bursitis,  Gluteus medius/minimus tears  Reproductive/Obstetrics negative OB ROS                             Anesthesia Physical  Anesthesia Plan  ASA: III  Anesthesia Plan: General   Post-op Pain Management:    Induction: Intravenous  PONV Risk Score and Plan: 3 and Dexamethasone, Ondansetron and Treatment may vary due to age or medical condition  Airway Management Planned: Oral ETT  Additional Equipment:   Intra-op Plan:   Post-operative Plan: Extubation in OR  Informed Consent: I have reviewed the patients History and Physical, chart, labs and discussed the procedure including the risks, benefits and alternatives for the proposed anesthesia with the patient or authorized representative who  has indicated his/her understanding and acceptance.   Dental advisory given  Plan Discussed with: CRNA  Anesthesia Plan Comments:         Anesthesia Quick Evaluation

## 2017-06-16 LAB — BASIC METABOLIC PANEL
Anion gap: 6 (ref 5–15)
BUN: 11 mg/dL (ref 6–20)
CALCIUM: 8.6 mg/dL — AB (ref 8.9–10.3)
CO2: 25 mmol/L (ref 22–32)
CREATININE: 0.66 mg/dL (ref 0.44–1.00)
Chloride: 98 mmol/L — ABNORMAL LOW (ref 101–111)
GFR calc Af Amer: >60 mL/min (ref >60)
GLUCOSE: 127 mg/dL — AB (ref 65–99)
Potassium: 4 mmol/L (ref 3.5–5.1)
Sodium: 129 mmol/L — ABNORMAL LOW (ref 135–145)

## 2017-06-16 LAB — PROTIME-INR
INR: 1.09
Prothrombin Time: 14 seconds (ref 11.4–15.2)

## 2017-06-16 LAB — CBC
HCT: 20.5 % — ABNORMAL LOW (ref 36.0–46.0)
Hemoglobin: 7.1 g/dL — ABNORMAL LOW (ref 12.0–15.0)
MCH: 33.8 pg (ref 26.0–34.0)
MCHC: 34.6 g/dL (ref 30.0–36.0)
MCV: 97.6 fL (ref 78.0–100.0)
PLATELETS: 220 10*3/uL (ref 150–400)
RBC: 2.1 MIL/uL — ABNORMAL LOW (ref 3.87–5.11)
RDW: 14.1 % (ref 11.5–15.5)
WBC: 9.1 10*3/uL (ref 4.0–10.5)

## 2017-06-16 MED ORDER — WARFARIN SODIUM 5 MG PO TABS
10.0000 mg | ORAL_TABLET | Freq: Once | ORAL | Status: AC
  Administered 2017-06-16: 17:00:00 10 mg via ORAL
  Filled 2017-06-16: qty 2

## 2017-06-16 NOTE — Op Note (Signed)
Tracy Peters, Tracy Peters               ACCOUNT NO.:  [id number]  MEDICAL RECORD NO.:  [id number]  LOCATION:                                 FACILITY:  PHYSICIAN:  Pietro Cassis. Alvan Dame, M.D.       DATE OF BIRTH:  DATE OF PROCEDURE:  06/15/2017 DATE OF DISCHARGE:                              OPERATIVE REPORT   PREOPERATIVE DIAGNOSES:  Right hip pain with constellation of issues including: 1. Right hip osteoarthritis associated with degenerative labral tear. 2. Chronic right hip bursitis. 3. Gluteal tendon tear.  POSTOPERATIVE DIAGNOSES:  Right hip pain with constellation of issues including: 1. Right hip osteoarthritis associated with degenerative labral tear. 2. Chronic right hip bursitis. 3. Gluteal tendon tear.  PROCEDURES: 1. Open right hip bursectomy. 2. Right total hip arthroplasty. 3. Right gluteus medius and minimus repair, open.  SURGEON:  Pietro Cassis. Alvan Dame, M.D.  ASSISTANT:  Danae Orleans, PA  ANESTHESIA:  General.  SPECIMENS:  None.  COMPLICATIONS:  None.  BLOOD LOSS:  About 600 mL.  INDICATIONS FOR PROCEDURE:  Ms. Tracy Peters is an 84 year old female, who had been seen and evaluated in the office for persistent recurring chronic right hip pain.  She had tried and failed multiple attempts at conservative measures.  Ultimately, an MRI was ordered, which confirmed degenerative labral pathology associated with degenerative hip disease associated with chronic bursitis and gluteal tendon tear.  We had a lengthy discussion in the office regarding how to proceed.  We ultimately decided that a single procedure would not be adequate given the fact that there could be persistent problems and that we decided to proceed with total hip arthroplasty not only to deal with the intra- articular process but also to repair the gluteal tendon tears.  I discussed the limitations of the procedures, the risks of infection, DVT, dislocation, need for future surgeries.  We discussed also  the limitations and success of gluteal tendon repair.  Based on this, consent was obtained for benefit of pain relief.  PROCEDURE IN DETAIL:  The patient was brought to the operative theater. Once adequate anesthesia, preoperative antibiotics administered as well as Decadron.  She was positioned into the left lateral decubitus position with the right hip up.  The right lower extremity was then prepped and draped in sterile fashion.  A time-out was performed, identifying the patient, the planned procedures, and extremity.  At this point, I marked out an incision for posterior approach to the hip, but I spent a lot of time at this point to expose the anterior aspect of the trochanter.  Soft tissue dissection was carried down through the gluteal fascia and the iliotibial band.  This was then incised.  Once I opened up stems, I encountered a moderate amount of bursal fluid.  This was consistent with interval MRI findings.  In addition, I found significantly ratty bursal tissue.  This was debrided sharply at this point.  Also, noted a cobblestone type appearance to the lateral trochanter, which I debrided back.  At this point, I exposed anteriorly and identified a partial tear to the gluteus medius construct as it inserts on the anterolateral aspect of the trochanter.  I was able  to stimulate a portion of this and get contractility.  This was at least identified for later repair.  Given the fact that we debrided the fluid as well as debrided the bursal tissue and identified the gluteal tear, I went on to perform a total hip arthroplasty.  Posterior aspect of the hip was exposed in routine fashion.  The hip was dislocated and neck osteotomy made.  I first attended to femoral preparation.  Proximal femur was opened with a drill, then hand reamed and irrigated to try to prevent fat emboli.  I then began broaching and broached up initially to a size 4 broach.  At this point, I packed off the  femur with an acetabulum and placement of acetabular retractors, I debrided soft tissue.  I then began reaming with a 43 reamer and was only able to ream up to 47 reamer.  I selected a 48 mm cup, 48 cup was then impacted.  I did go ahead and placed a cancellous screw and placed a 32 +4 neutral liner.  The cup appeared to be about at 35 degrees of abduction in forward flexion with the anterior portion of the acetabulum identified.  At this point, I did a trial reduction with a size 4, initially with a 4 and stem high offset neck and a 32 +1 ball. Identified that I was short as the hip had shuck and it was definitely short compared to the down leg comparatively.  I removed the size 4 broach and then broached to a size 5, leaving a proud from the neck cut. At this point, I did a repeat trial reduction and found that the leg lengths were much more stable.  The hip was stable from hip flexion, internal rotation, however, with extension and external rotation, there was impingement on the superolateral aspect of the cup.  I did not feel this was acceptable.  Though the combined anteversion appeared to be about 50 degrees or so, there was this impingement concern with extension, external rotation.  I elected at this point to revise the acetabular component position. This did require removing the acetabular liner unfortunately.  I removed the liner, the screw, and the hole eliminator.  I then used a bone tamp and positioned the cup, still with plenty of anteversion I feel, but took some of the abduction out of it, made it a bit more abducted.  At this point, I placed a trial liner after placing another screw into the ilium.  I re-did a trial reduction with a 5 broach, re-trialed at this point with a 5 broach and high-offset neck.  At this point, I felt that some of the combined anteversion was out of it, but the hip remained stable from hip flexion, internal rotation without evidence  of subluxation and there definitely were no signs of impingement with extension, external rotation.  At this point, the trial components were removed.  The final 32 +4 neutral AltrX liner was then placed.  The final 5 high Tri-Lock stem was opened and impacted.  Based on where it sat, I did trial with a 32 +5 ball, ended up selecting this.  The leg lengths appeared to be equal.  The hip was stable.  Given these findings, the hip was irrigated as it had been throughout the case.  As I have completed the total hip components of the case, I now addressed to repairing the gluteus tendon tear identified.  Using drill holes, I passed #1 Ethibond suture through the drill  holes through the lateral anterior aspect of the trochanter and then captured the tendon. I then reapproximated the tendon to the bone and using the 2 Ethibond sutures, I reapproximated the tendon tear back to bone.  This was oversewn with #1 Vicryl.  This appeared to create, at this point, a watertight seal.  Once this was done, I now reapproximated the iliotibial band and gluteal fascia using a combination of #1 Vicryl and 0 V-Loc suture.  Once this layer was closed, we injected 2 g equivalent of tranexamic acid topically and none of this leaked out anterior or posterior.  The remainder of the wound at this point was closed with 2-0 Vicryl, running Monocryl suture. The remainder of the wound was cleaned, dried, and dressed sterilely using surgical glue and Aquacel dressing.  She was then extubated and brought to the recovery room in stable condition.  Findings were reviewed with her husband.  I will have her be partial weightbearing for probably 4 to 6 weeks.  I will then limit her active abduction.  Posterior hip precautions will be followed.     Pietro Cassis Alvan Dame, M.D.     MDO/MEDQ  D:  06/15/2017  T:  06/16/2017  Job:  [id number]

## 2017-06-16 NOTE — Evaluation (Signed)
Physical Therapy Evaluation Patient Details Name: Tracy Peters MRN: [id number] DOB: 1934-03-14 Today's Date: 06/16/2017   History of Present Illness  84 yo female s/p R posterior total hip replacement,  bursectomy, and repair of gluteal tendon. PMH: multiple falls, ACL tear, shoulder surgery.    Clinical Impression  On eval, pt required Min assist +2 for safety/equipment for mobility. She was able to walk ~6 feet in the room with a RW. Moderate pain with activity. Pt is fairly weak and fatigues easily with activity. Discussed d/c plan-pt stated MD does not want her to go to a rehab facility. Pt is currently planning to d/c home with family assisting. Will continue to update recommendations as needed. Will progress activity as tolerated.     Follow Up Recommendations SNF vs Home health PT;Supervision/Assistance - 24 hour(depending on progress. Pt is currently declining SNF placement.)    Equipment Recommendations       Recommendations for Other Services       Precautions / Restrictions Precautions Precautions: Posterior Hip;Fall Precaution Booklet Issued: Yes (comment) Restrictions Weight Bearing Restrictions: Yes RLE Weight Bearing: Partial weight bearing RLE Partial Weight Bearing Percentage or Pounds: 50%      Mobility  Bed Mobility Overal bed mobility: Needs Assistance Bed Mobility: Supine to Sit     Supine to sit: Min assist;HOB elevated     General bed mobility comments: cues for technique for self assisting and assist for R LE. Increased time.   Transfers Overall transfer level: Needs assistance Equipment used: Rolling walker (2 wheeled) Transfers: Sit to/from Stand Sit to Stand: Min assist;+2 safety/equipment;+2 physical assistance;From elevated surface         General transfer comment: Assist to rise, stabilize, control descent. VCs safety, technique, hand/LE placement.   Ambulation/Gait Ambulation/Gait assistance: Min assist;+2  safety/equipment Ambulation Distance (Feet): 6 Feet Assistive device: Rolling walker (2 wheeled) Gait Pattern/deviations: Step-to pattern;Antalgic     General Gait Details: VCs safety, technique, step length, adherence to PWB status. Very slow and effortful gait speed. Pt c/o fatigue, weakness, and lightheadedness so brought recliner up for pt to sit.   Stairs            Wheelchair Mobility    Modified Rankin (Stroke Patients Only)       Balance Overall balance assessment: Needs assistance   Sitting balance-Leahy Scale: Fair     Standing balance support: Bilateral upper extremity supported Standing balance-Leahy Scale: Poor                               Pertinent Vitals/Pain Pain Assessment: Faces Faces Pain Scale: Hurts whole lot Pain Location: R hip with activity Pain Descriptors / Indicators: Moaning;Grimacing;Guarding;Sharp Pain Intervention(s): Limited activity within patient's tolerance;Repositioned    Home Living Family/patient expects to be discharged to:: Private residence Living Arrangements: Spouse/significant other Available Help at Discharge: Family;Available 24 hours/day Type of Home: House Home Access: Stairs to enter Entrance Stairs-Rails: Left Entrance Stairs-Number of Steps: 2 Home Layout: One level Home Equipment: Other (comment);Walker - 2 wheels;Grab bars - toilet;Grab bars - tub/shower(bed rail) Additional Comments: Pt reports all her beds are tall with steps to get in and out.    Prior Function Level of Independence: Independent               Hand Dominance   Dominant Hand: Right    Extremity/Trunk Assessment   Upper Extremity Assessment Upper Extremity Assessment: Defer to OT evaluation  Lower Extremity Assessment Lower Extremity Assessment: Generalized weakness(s/p hip surgery)    Cervical / Trunk Assessment Cervical / Trunk Assessment: Normal  Communication   Communication: No difficulties   Cognition Arousal/Alertness: Awake/alert Behavior During Therapy: WFL for tasks assessed/performed Overall Cognitive Status: Within Functional Limits for tasks assessed                                        General Comments      Exercises     Assessment/Plan    PT Assessment Patient needs continued PT services  PT Problem List Decreased strength;Decreased range of motion;Decreased activity tolerance;Decreased balance;Decreased mobility;Pain;Decreased knowledge of use of DME;Decreased knowledge of precautions       PT Treatment Interventions DME instruction;Gait training;Therapeutic activities;Patient/family education;Functional mobility training;Balance training;Therapeutic exercise;Stair training    PT Goals (Current goals can be found in the Care Plan section)  Acute Rehab PT Goals Patient Stated Goal: to go home with assist of her daughter and husband PT Goal Formulation: With patient Time For Goal Achievement: 06/30/17 Potential to Achieve Goals: Good    Frequency 7X/week   Barriers to discharge        Co-evaluation   Reason for Co-Treatment: For patient/therapist safety   OT goals addressed during session: ADL's and self-care       AM-PAC PT "6 Clicks" Daily Activity  Outcome Measure Difficulty turning over in bed (including adjusting bedclothes, sheets and blankets)?: Unable Difficulty moving from lying on back to sitting on the side of the bed? : Unable Difficulty sitting down on and standing up from a chair with arms (e.g., wheelchair, bedside commode, etc,.)?: Unable Help needed moving to and from a bed to chair (including a wheelchair)?: A Lot Help needed walking in hospital room?: A Lot Help needed climbing 3-5 steps with a railing? : Total 6 Click Score: 8    End of Session Equipment Utilized During Treatment: Gait belt Activity Tolerance: Patient limited by fatigue;Patient limited by pain Patient left: in chair;with call  bell/phone within reach   PT Visit Diagnosis: Muscle weakness (generalized) (M62.81);Difficulty in walking, not elsewhere classified (R26.2);Pain Pain - Right/Left: Right Pain - part of body: Leg    Time: 7591-6384 PT Time Calculation (min) (ACUTE ONLY): 27 min   Charges:   PT Evaluation $PT Eval Moderate Complexity: 1 Mod     PT G Codes:         Weston Anna, MPT Pager: [phone]

## 2017-06-16 NOTE — Progress Notes (Signed)
Patient ID: Tracy Peters, female   DOB: July 27, 1933, 84 y.o.   MRN: [id number] Subjective: 1 Day Post-Op Procedure(s) (LRB): Right total hip arthroplasty, bursectomy, repair of gluteal tendon, posterior approach (Right) OPEN SURGICAL REPAIR OF GLUTEALmedius TENDON (Right)    Patient reports pain as moderate to severe per reports to nursing.  No activity yet  Objective:   VITALS:   Vitals:   06/16/17 0259 06/16/17 0638  BP: (!) 129/58 112/61  Pulse: 76 83  Resp: 16 15  Temp: 98.4 F (36.9 C) 98.7 F (37.1 C)  SpO2: 99% 100%    Neurovascular intact Incision: dressing C/D/I  LABS Recent Labs    06/15/17 1906 06/16/17 0542  HGB 9.1* 7.1*  HCT 26.7* 20.5*  WBC 11.8* 9.1  PLT 273 220    Recent Labs    06/15/17 1906 06/16/17 0542  NA  --  129*  K  --  4.0  BUN  --  11  CREATININE 0.71 0.66  GLUCOSE  --  127*    Recent Labs    06/15/17 1313 06/16/17 0542  INR 1.00 1.09     Assessment/Plan: 1 Day Post-Op Procedure(s) (LRB): Right total hip arthroplasty, bursectomy, repair of gluteal tendon, posterior approach (Right) OPEN SURGICAL REPAIR OF GLUTEALmedius TENDON (Right)   Advance diet Up with therapy carefully  ABLA - related to surgery and required anticoagulation  Repeat CBC in am  Monitor vitals  FeSO4 for now  Consider transfusion if symptomatic  Discharge disposition pending therapy assessment and capabilities

## 2017-06-16 NOTE — Evaluation (Signed)
Occupational Therapy Evaluation Patient Details Name: Tracy Peters MRN: [id number] DOB: 17-Sep-1933 Today's Date: 06/16/2017    History of Present Illness Pt admitted for R posterior total hip replacement,  bursectomy, and repair of gluteal tendon. PMH: multiple falls, ACL tear, shoulder surgery.   Clinical Impression   Pt was independent prior to admission. Reports many falls in her history. Pt presents with post operative pain and poor standing balance. She requires 2 person assist for safety with ambulation and reports some mild dizziness. Pt requires min to total assist for bathing and dressing. Educated in use of AE, but did not practice this visit. Will follow acutely.    Follow Up Recommendations  Home health OT;Supervision/Assistance - 24 hour    Equipment Recommendations  3 in 1 bedside commode    Recommendations for Other Services       Precautions / Restrictions Precautions Precautions: Posterior Hip;Fall Precaution Booklet Issued: Yes (comment) Restrictions Weight Bearing Restrictions: Yes RLE Weight Bearing: Partial weight bearing RLE Partial Weight Bearing Percentage or Pounds: 50      Mobility Bed Mobility Overal bed mobility: Needs Assistance Bed Mobility: Supine to Sit     Supine to sit: Min assist     General bed mobility comments: cues for technique for self assisting and assist for R LE  Transfers Overall transfer level: Needs assistance Equipment used: Rolling walker (2 wheeled) Transfers: Sit to/from Stand Sit to Stand: +2 physical assistance;Min assist         General transfer comment: cues for hand and R LE placement assist to rise and steady    Balance Overall balance assessment: Needs assistance   Sitting balance-Leahy Scale: Fair     Standing balance support: Bilateral upper extremity supported Standing balance-Leahy Scale: Poor                             ADL either performed or assessed with clinical judgement    ADL Overall ADL's : Needs assistance/impaired Eating/Feeding: Independent;Bed level   Grooming: Set up;Sitting;Brushing hair   Upper Body Bathing: Minimal assistance;Sitting   Lower Body Bathing: Total assistance;Sit to/from stand   Upper Body Dressing : Minimal assistance;Sitting   Lower Body Dressing: Total assistance;Sit to/from stand   Toilet Transfer: Minimal assistance;+2 for safety/equipment;Ambulation;BSC;RW;Cueing for sequencing   Toileting- Clothing Manipulation and Hygiene: Minimal assistance;Sitting/lateral lean;Sit to/from stand       Functional mobility during ADLs: Minimal assistance;+2 for safety/equipment;Cueing for sequencing;Rolling walker General ADL Comments: Introduced AE for LB bathing and dressing.     Vision Baseline Vision/History: Wears glasses Patient Visual Report: No change from baseline       Perception     Praxis      Pertinent Vitals/Pain Pain Assessment: Faces Faces Pain Scale: Hurts even more Pain Location: R hip Pain Descriptors / Indicators: Moaning;Grimacing;Guarding Pain Intervention(s): Monitored during session;Ice applied;Limited activity within patient's tolerance     Hand Dominance Right   Extremity/Trunk Assessment Upper Extremity Assessment Upper Extremity Assessment: Generalized weakness   Lower Extremity Assessment Lower Extremity Assessment: Defer to PT evaluation       Communication Communication Communication: No difficulties   Cognition Arousal/Alertness: Awake/alert Behavior During Therapy: WFL for tasks assessed/performed Overall Cognitive Status: Within Functional Limits for tasks assessed  General Comments       Exercises     Shoulder Instructions      Home Living Family/patient expects to be discharged to:: Private residence Living Arrangements: Spouse/significant other Available Help at Discharge: Family;Available 24 hours/day Type of  Home: House Home Access: Stairs to enter [employer] of Steps: 2 Entrance Stairs-Rails: Left Home Layout: One level     Bathroom Shower/Tub: Occupational psychologist: Handicapped height     Home Equipment: Other (comment);Walker - 2 wheels;Grab bars - toilet;Grab bars - tub/shower(bed rail)   Additional Comments: Pt reports all her beds are tall with steps to get in and out.      Prior Functioning/Environment Level of Independence: Independent                 OT Problem List: Decreased strength;Decreased activity tolerance;Impaired balance (sitting and/or standing);Decreased knowledge of use of DME or AE;Pain;Decreased knowledge of precautions      OT Treatment/Interventions: Self-care/ADL training;DME and/or AE instruction;Patient/family education;Therapeutic activities;Balance training    OT Goals(Current goals can be found in the care plan section) Acute Rehab OT Goals Patient Stated Goal: to go home with assist of her daughter and husbanc OT Goal Formulation: With patient Time For Goal Achievement: 06/30/17 Potential to Achieve Goals: Good ADL Goals Pt Will Perform Grooming: with min guard assist;standing Pt Will Perform Lower Body Bathing: with min guard assist;with adaptive equipment;sit to/from stand Pt Will Perform Lower Body Dressing: with min guard assist;sit to/from stand;with adaptive equipment Pt Will Transfer to Toilet: with min guard assist;ambulating;bedside commode Pt Will Perform Toileting - Clothing Manipulation and hygiene: sit to/from stand;with min guard assist Pt Will Perform Tub/Shower Transfer: with min assist;ambulating;3 in 1;rolling walker Pt/caregiver will Perform Home Exercise Program: Increased strength;Both right and left upper extremity;With theraband;Independently Additional ADL Goal #1: Pt will generalize weight bearing and posterior hip precautions in ADL with supervision.  OT Frequency: Min 3X/week   Barriers to  D/C:            Co-evaluation PT/OT/SLP Co-Evaluation/Treatment: Yes Reason for Co-Treatment: For patient/therapist safety   OT goals addressed during session: ADL's and self-care      AM-PAC PT "6 Clicks" Daily Activity     Outcome Measure Help from another person eating meals?: None Help from another person taking care of personal grooming?: A Little Help from another person toileting, which includes using toliet, bedpan, or urinal?: A Lot Help from another person bathing (including washing, rinsing, drying)?: A Lot Help from another person to put on and taking off regular upper body clothing?: A Little Help from another person to put on and taking off regular lower body clothing?: A Lot 6 Click Score: 16   End of Session Equipment Utilized During Treatment: Gait belt;Rolling walker Nurse Communication: Mobility status  Activity Tolerance: Patient limited by pain;Patient limited by fatigue Patient left: in chair;with call bell/phone within reach  OT Visit Diagnosis: Unsteadiness on feet (R26.81);Other abnormalities of gait and mobility (R26.89);Pain;Muscle weakness (generalized) (M62.81)                Time: 5638-7564 OT Time Calculation (min): 40 min Charges:  OT General Charges $OT Visit: 1 Visit OT Evaluation $OT Eval Moderate Complexity: 1 Mod G-Codes:     06/17/17 Nestor Lewandowsky, OTR/L Pager: [phone] Werner Lean, Haze Boyden 06-17-17, 10:23 AM

## 2017-06-16 NOTE — Progress Notes (Signed)
Physical Therapy Treatment Patient Details Name: Tracy Peters MRN: [id number] DOB: 05/25/1934 Today's Date: 06/16/2017    History of Present Illness 84 yo female s/p R posterior total hip replacement,  bursectomy, and repair of gluteal tendon. PMH: multiple falls, ACL tear, shoulder surgery.    PT Comments    POD # 1 pm session Assisted pt out of recliner to amb a limited distance.  Very slow unsteady gait with difficulty advancing R LE with limited mod c/o fatigue.  See mobility detail below.  Assisted back to bed and positioned to comfort.   Pt is progressing slowly and will most likely need ST Rehab at SNF.    Follow Up Recommendations  SNF;Home health PT;Supervision/Assistance - 24 hour     Equipment Recommendations       Recommendations for Other Services       Precautions / Restrictions Precautions Precautions: Posterior Hip;Fall Precaution Booklet Issued: Yes (comment) Precaution Comments: also gluteal tendon repair Restrictions Weight Bearing Restrictions: Yes RLE Weight Bearing: Partial weight bearing RLE Partial Weight Bearing Percentage or Pounds: 50%    Mobility  Bed Mobility Overal bed mobility: Needs Assistance Bed Mobility: Sit to Supine       Sit to supine: Max assist   General bed mobility comments: assist B LE at same time with pillow between knees slowly up onto bed while maintaining neutral hip rotation and avoiding hip flex > 90 degrees  Transfers Overall transfer level: Needs assistance Equipment used: Rolling walker (2 wheeled) Transfers: Sit to/from Stand Sit to Stand: [employer] safety/equipment;+2 physical assistance;From elevated surface         General transfer comment: Assist to rise, stabilize, control descent. VCs safety, technique, hand/LE placement.   Ambulation/Gait Ambulation/Gait assistance: Min assist;+2 safety/equipment;Mod assist Ambulation Distance (Feet): 8 Feet Assistive device: Rolling walker (2 wheeled) Gait  Pattern/deviations: Step-to pattern;Antalgic;Decreased stance time - right Gait velocity: decreased x 3   General Gait Details: increased time, very short steps with much effort and some anxiety/fear of pain.  Limited distance due to pain and fatigue from excessive WBing thru walker to achieve PWB.   Stairs            Wheelchair Mobility    Modified Rankin (Stroke Patients Only)       Balance                                            Cognition   Behavior During Therapy: WFL for tasks assessed/performed Overall Cognitive Status: Within Functional Limits for tasks assessed                                 General Comments: some anxiety/fear of pain      Exercises      General Comments        Pertinent Vitals/Pain Pain Assessment: 0-10 Pain Score: 8  Pain Location: R hip with activity Pain Descriptors / Indicators: Moaning;Grimacing;Guarding;Sharp;Operative site guarding Pain Intervention(s): Monitored during session;Repositioned;Premedicated before session;Ice applied    Home Living                      Prior Function            PT Goals (current goals can now be found in the care plan section) Progress towards PT goals: Progressing  toward goals    Frequency    7X/week      PT Plan Current plan remains appropriate    Co-evaluation              AM-PAC PT "6 Clicks" Daily Activity  Outcome Measure  Difficulty turning over in bed (including adjusting bedclothes, sheets and blankets)?: Unable Difficulty moving from lying on back to sitting on the side of the bed? : Unable Difficulty sitting down on and standing up from a chair with arms (e.g., wheelchair, bedside commode, etc,.)?: Unable Help needed moving to and from a bed to chair (including a wheelchair)?: A Lot Help needed walking in hospital room?: A Lot Help needed climbing 3-5 steps with a railing? : Total 6 Click Score: 8    End of Session  Equipment Utilized During Treatment: Gait belt Activity Tolerance: Patient limited by fatigue;Patient limited by pain Patient left: in bed;with call bell/phone within reach;with bed alarm set;with family/visitor present Nurse Communication: Mobility status PT Visit Diagnosis: Muscle weakness (generalized) (M62.81);Difficulty in walking, not elsewhere classified (R26.2);Pain Pain - Right/Left: Right Pain - part of body: Leg     Time: 7121-9758 PT Time Calculation (min) (ACUTE ONLY): 45 min  Charges:  $Gait Training: 8-22 mins $Therapeutic Activity: 23-37 mins                    G Codes:       Rica Koyanagi  PTA [facility]  Acute  Rehab Pager      [phone]

## 2017-06-16 NOTE — Progress Notes (Addendum)
CSW Consult-SNF  Per physician- Discharge disposition pending therapy assessment and capabilities CSW will assist if SNF needs arise.   Kathrin Greathouse, Latanya Presser, MSW Clinical Social Worker  [phone] 06/16/2017  9:56 AM

## 2017-06-16 NOTE — Progress Notes (Addendum)
ANTICOAGULATION CONSULT NOTE -   Pharmacy Consult for warfarin Indication: DVT, s/p THA  Allergies  Allergen Reactions  . Bee Venom Anaphylaxis and Other (See Comments)    Yellow jackets, wasps as well  . Ivp Dye [Iodinated Diagnostic Agents] Anaphylaxis  . Shellfish Allergy Anaphylaxis  . Amlodipine Besylate Swelling  . Aspirin Other (See Comments)    REACTION: unspecified  . Atorvastatin Diarrhea  . Cholestyramine Other (See Comments)    REACTION: mouth irritation  . Codeine Phosphate Nausea And Vomiting  . Demerol [Meperidine] Other (See Comments)    Severe Hallucination!!!!  . Diphenhydramine Hcl Other (See Comments)    hyperactive  . Doxycycline Nausea Only  . Gentamicin Sulfate Other (See Comments)    REACTION: unspecified  . Meclizine Hcl Other (See Comments)    REACTION: more dizziness on it  . Meperidine Hcl Other (See Comments)    Unknown  . Metronidazole Other (See Comments)    REACTION: bad taste  . Moxifloxacin Other (See Comments)    REACTION: insomnia Able to use Cipro  . Sulfamethoxazole Other (See Comments)    Kidney problems  . Penicillins Swelling, Rash and Other (See Comments)    Has patient had a PCN reaction causing immediate rash, facial/tongue/throat swelling, SOB or lightheadedness with hypotension: Yes Has patient had a PCN reaction causing severe rash involving mucus membranes or skin necrosis: No Has patient had a PCN reaction that required hospitalization: No Has patient had a PCN reaction occurring within the last 10 years: No If all of the above answers are "NO", then may proceed with Cephalosporin use.     Patient Measurements: Height: 5\' 5"  (165.1 cm) Weight: 121 lb (54.9 kg) IBW/kg (Calculated) : 57  Vital Signs: Temp: 98.7 F (37.1 C) (01/01 0638) Temp Source: Oral (01/01 0638) BP: 112/61 (01/01 3570) Pulse Rate: 83 (01/01 0638)  Labs: Recent Labs    06/15/17 1313 06/15/17 1906 06/16/17 0542  HGB  --  9.1* 7.1*  HCT   --  26.7* 20.5*  PLT  --  273 220  LABPROT 13.1  --  14.0  INR 1.00  --  1.09  CREATININE  --  0.71 0.66    Estimated Creatinine Clearance: 47.8 mL/min (by C-G formula based on SCr of 0.66 mg/dL).   Medical History: Past Medical History:  Diagnosis Date  . Adenomatous colon polyp   . Allergic rhinitis    uses FLonase nightly  . Anemia   . Arthritis    joint pain  . Blood transfusion   . Bronchitis    hx of-7-13yrs ago  . Carpal tunnel syndrome    numbness and tingling   . Cataracts, bilateral   . Chronic anticoagulation 03/21/2013  . Congenital absence of one kidney   . Diarrhea    takes Immodium daily as needed or Lomotil   . Diverticulosis   . GERD (gastroesophageal reflux disease)    mild  . Herpes    takes Valtrex daily  . History of colon polyps   . History of DVT (deep vein thrombosis) 1998   right arm  . History of staph infection   . Hyperlipidemia    Medical MD doesn't think its absolutely necessary  . Hypertension    takes Losartan daily  . IBS (irritable bowel syndrome)    takes Electronics engineer daily  . Insomnia    takes Ambien nightly as needed  . LBP (low back pain)   . Long term (current) use of anticoagulants  Dr. Beryle Beams  . Nocturia   . Osteoporosis   . Raynaud's disease   . 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 Winfield Cunas  . 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 Urinary leakage   . UTI (urinary tract infection)     Medications:  Scheduled:  . dexamethasone  10 mg Intravenous Once  . docusate sodium  100 mg Oral BID  . enoxaparin (LOVENOX) injection  40 mg Subcutaneous Q12H  . ferrous sulfate  325 mg Oral TID PC  . fluticasone  1 spray Each Nare QPM  . losartan  100 mg Oral Daily  . polyethylene glycol  17 g Oral BID  . Warfarin - Pharmacist Dosing Inpatient   Does not apply q1800   Infusions:  . sodium chloride 30 mL/hr at 06/16/17 0203  . methocarbamol (ROBAXIN)  IV 500 mg  (06/15/17 1702)    Assessment: 84 yo female admitted for L THA on warfarin PTA for hx VTE at a reported dose of 7.5mg  daily except 10mg  on Mondays and was taking Lovenox 80mg  q24 in preparation for the surgery with last dose 12/30.    06/16/2017 INR 1.09, subtherapeutic  H/H low post surgery Plts WNL DI: none  Goal of Therapy:  INR 2-3 Monitor platelets by anticoagulation protocol: Yes   Plan:  1) Warfarin 10mg  PO x 1 tonight 2) Daily INR 3) d/c enoxaparin once INR >/= 1.8   Dolly Rias RPh 06/16/2017, 8:11 AM Pager [phone]

## 2017-06-17 ENCOUNTER — Encounter (HOSPITAL_COMMUNITY): Payer: Self-pay | Admitting: Orthopedic Surgery

## 2017-06-17 ENCOUNTER — Telehealth: Payer: Self-pay | Admitting: Pharmacist

## 2017-06-17 LAB — PROTIME-INR
INR: 1.43
PROTHROMBIN TIME: 17.3 s — AB (ref 11.4–15.2)

## 2017-06-17 LAB — BASIC METABOLIC PANEL
ANION GAP: 7 (ref 5–15)
BUN: 9 mg/dL (ref 6–20)
CO2: 25 mmol/L (ref 22–32)
Calcium: 8.2 mg/dL — ABNORMAL LOW (ref 8.9–10.3)
Chloride: 101 mmol/L (ref 101–111)
Creatinine, Ser: 0.65 mg/dL (ref 0.44–1.00)
GFR calc Af Amer: >60 mL/min (ref >60)
GLUCOSE: 96 mg/dL (ref 65–99)
POTASSIUM: 3.8 mmol/L (ref 3.5–5.1)
SODIUM: 133 mmol/L — AB (ref 135–145)

## 2017-06-17 LAB — CBC
HCT: 20.9 % — ABNORMAL LOW (ref 36.0–46.0)
Hemoglobin: 7.2 g/dL — ABNORMAL LOW (ref 12.0–15.0)
MCH: 34 pg (ref 26.0–34.0)
MCHC: 34.4 g/dL (ref 30.0–36.0)
MCV: 98.6 fL (ref 78.0–100.0)
PLATELETS: 245 10*3/uL (ref 150–400)
RBC: 2.12 MIL/uL — AB (ref 3.87–5.11)
RDW: 14.6 % (ref 11.5–15.5)
WBC: 9.5 10*3/uL (ref 4.0–10.5)

## 2017-06-17 MED ORDER — ZOLPIDEM TARTRATE 5 MG PO TABS
5.0000 mg | ORAL_TABLET | Freq: Every evening | ORAL | Status: DC | PRN
  Administered 2017-06-17: 5 mg via ORAL
  Filled 2017-06-17: qty 1

## 2017-06-17 MED ORDER — WARFARIN SODIUM 5 MG PO TABS
10.0000 mg | ORAL_TABLET | Freq: Once | ORAL | Status: AC
  Administered 2017-06-17: 10 mg via ORAL
  Filled 2017-06-17: qty 2

## 2017-06-17 NOTE — Progress Notes (Signed)
Occupational Therapy Treatment Patient Details Name: Tracy Peters MRN: [id number] DOB: 01-Dec-1933 Today's Date: 06/17/2017    History of present illness 84 yo female s/p R posterior total hip replacement,  bursectomy, and repair of gluteal tendon. PMH: multiple falls, ACL tear, shoulder surgery.   OT comments  Pt very motivated. Cues for THPs; slow transitions from sit to stand.  Had a couple of muscle spasms during OT session.  Used AE and ambulated to bathroom with extra time  Follow Up Recommendations  Home health OT;Supervision/Assistance - 24 hour    Equipment Recommendations  3 in 1 bedside commode    Recommendations for Other Services      Precautions / Restrictions Precautions Precautions: Posterior Hip;Fall Precaution Booklet Issued: Yes (comment) Precaution Comments: also gluteal tendon repair Restrictions Weight Bearing Restrictions: Yes RLE Weight Bearing: Partial weight bearing RLE Partial Weight Bearing Percentage or Pounds: 50       Mobility Bed Mobility               General bed mobility comments: oob  Transfers   Equipment used: Rolling walker (2 wheeled)   Sit to Stand: Min assist;Mod assist         General transfer comment: mod A from chair; min A from 3:1 commode; slow transitions; cues for LE/UE placement and THPS    Balance                                           ADL either performed or assessed with clinical judgement   ADL               Lower Body Bathing: Moderate assistance;Sit to/from stand       Lower Body Dressing: Moderate assistance;Sit to/from stand;With adaptive equipment   Toilet Transfer: Moderate assistance;Ambulation;BSC;RW(for sit to stand from chair; min A ambulating)   Toileting- Clothing Manipulation and Hygiene: Minimal assistance;Sit to/from stand         General ADL Comments: used AE from edge of chair to don/doff socks and simulate pants. Ambulated to bathroom and used  commode.  Mod A for sit to stand; slow transitions. At times needed assistance with walker for correct distance and cues for sequencing     Vision       Perception     Praxis      Cognition Arousal/Alertness: Awake/alert Behavior During Therapy: WFL for tasks assessed/performed Overall Cognitive Status: Within Functional Limits for tasks assessed                                          Exercises     Shoulder Instructions       General Comments      Pertinent Vitals/ Pain       Faces Pain Scale: Hurts even more Pain Location: R hip when scooting Pain Descriptors / Indicators: Moaning;Spasm Pain Intervention(s): Limited activity within patient's tolerance;Monitored during session;Premedicated before session;Repositioned;Ice applied  Home Living                                          Prior Functioning/Environment              Frequency  Progress Toward Goals  OT Goals(current goals can now be found in the care plan section)  Progress towards OT goals: Progressing toward goals     Plan      Co-evaluation                 AM-PAC PT "6 Clicks" Daily Activity     Outcome Measure   Help from another person eating meals?: None Help from another person taking care of personal grooming?: A Little Help from another person toileting, which includes using toliet, bedpan, or urinal?: A Lot Help from another person bathing (including washing, rinsing, drying)?: A Lot Help from another person to put on and taking off regular upper body clothing?: A Little Help from another person to put on and taking off regular lower body clothing?: A Lot 6 Click Score: 16    End of Session    OT Visit Diagnosis: Unsteadiness on feet (R26.81);Other abnormalities of gait and mobility (R26.89);Pain;Muscle weakness (generalized) (M62.81)   Activity Tolerance Patient tolerated treatment well   Patient Left in chair;with call  bell/phone within reach;with family/visitor present   Nurse Communication          Time: 7253-6644 OT Time Calculation (min): 50 min  Charges: OT General Charges $OT Visit: 1 Visit OT Treatments $Self Care/Home Management : 38-52 mins  Marica Otter, OTR/L [phone] 06/17/2017   [name] 06/17/2017, 10:38 AM

## 2017-06-17 NOTE — Progress Notes (Signed)
Discharge planning, spoke with patient and spouse at bedside. Have chosen [facility] for [facility] PT, evaluate and treat. Contacted [facility] for referral. Requesting hospital bed and also needs RW and 3n1, contacted [facility] to deliver to room. [phone]

## 2017-06-17 NOTE — Anesthesia Postprocedure Evaluation (Signed)
Anesthesia Post Note  Patient: TAMECIA MCDOUGALD  Procedure(s) Performed: Right total hip arthroplasty, bursectomy, repair of gluteal tendon, posterior approach (Right Hip) OPEN SURGICAL REPAIR OF GLUTEALmedius TENDON (Right Hip)     Patient location during evaluation: PACU Anesthesia Type: General Level of consciousness: awake and alert Pain management: pain level controlled Vital Signs Assessment: post-procedure vital signs reviewed and stable Respiratory status: spontaneous breathing, nonlabored ventilation, respiratory function stable and patient connected to nasal cannula oxygen Cardiovascular status: blood pressure returned to baseline and stable Postop Assessment: no apparent nausea or vomiting Anesthetic complications: no    Last Vitals:  Vitals:   06/16/17 2219 06/17/17 0610  BP: (!) 128/52 (!) 121/40  Pulse: (!) 106 85  Resp: 15 16  Temp: 37.1 C 37.1 C  SpO2: 99% 98%    Last Pain:  Vitals:   06/17/17 0815  TempSrc:   PainSc: 9                  Montez Hageman

## 2017-06-17 NOTE — Progress Notes (Signed)
    Durable Medical Equipment  (From admission, onward)        Start     Ordered   06/17/17 1220  For home use only DME Hospital bed  Once    Question Answer Comment  Patient has (list medical condition): S/P Right total hip arthroplasty, bursectomy, repair of gluteal tendon, posterior approach   The above medical condition requires: Patient requires the ability to reposition frequently   Bed type Semi-electric      06/17/17 1226   06/17/17 1214  For home use only DME Bedside commode  Once    Question:  Patient needs a bedside commode to treat with the following condition  Answer:  Surgery, elective   06/17/17 1213   06/17/17 1214  For home use only DME Walker rolling  Once    Question:  Patient needs a walker to treat with the following condition  Answer:  Surgery, elective   06/17/17 1213

## 2017-06-17 NOTE — Progress Notes (Signed)
ANTICOAGULATION CONSULT NOTE -   Pharmacy Consult for warfarin Indication: DVT, s/p THA  Patient Measurements: Height: 5\' 5"  (165.1 cm) Weight: 121 lb (54.9 kg) IBW/kg (Calculated) : 57  Vital Signs: Temp: 98.8 F (37.1 C) (01/02 0610) Temp Source: Oral (01/02 0610) BP: 121/40 (01/02 0610) Pulse Rate: 85 (01/02 0610)  Labs: Recent Labs    06/15/17 1313  06/15/17 1906 06/16/17 0542 06/17/17 0512  HGB  --    < > 9.1* 7.1* 7.2*  HCT  --   --  26.7* 20.5* 20.9*  PLT  --   --  273 220 245  LABPROT 13.1  --   --  14.0 17.3*  INR 1.00  --   --  1.09 1.43  CREATININE  --   --  0.71 0.66 0.65   < > = values in this interval not displayed.    Estimated Creatinine Clearance: 47.8 mL/min (by C-G formula based on SCr of 0.65 mg/dL).   Medications:  Scheduled:  . docusate sodium  100 mg Oral BID  . enoxaparin (LOVENOX) injection  40 mg Subcutaneous Q12H  . ferrous sulfate  325 mg Oral TID PC  . fluticasone  1 spray Each Nare QPM  . losartan  100 mg Oral Daily  . polyethylene glycol  17 g Oral BID  . Warfarin - Pharmacist Dosing Inpatient   Does not apply q1800   Infusions:  . sodium chloride 20 mL/hr at 06/16/17 1100  . methocarbamol (ROBAXIN)  IV Stopped (06/16/17 2140)    Assessment: 84 yo female admitted for L THA, bursectomy, and repair of gluteal tendon.  PMH includes chronic warfarin anticoagulation PTA for hx VTE and hypercoagulable state.  PTA dose reported as 7.5mg  daily except 10mg  on Mondays and was taking Lovenox 80mg  q24 in preparation for the surgery with last dose 12/30.  Note that most recent anticoagulation clinic visit on 12/21 shows low INR 1.3.  Pharmacy was consulted to resume warfarin post procedure.  Lovenox dosing per MD is 40 mg SQ BID (~ 0.75 mg/kg) instead of PTA Lovenox 80 mg daily (1.5 mg/kg).   06/17/2017 INR 1.43, subtherapeutic but increasing after 2 doses Hgb 7.2 remains decreased but stable post surgery Plts WNL CrCl > 30 ml/min DI:  none  Goal of Therapy:  INR 2-3 Monitor platelets by anticoagulation protocol: Yes   Plan:  1) Warfarin 10mg  PO x 1 tonight, repeat boosted dose 2) Daily INR 3) d/c enoxaparin once INR >/= 1.8 4) Follow up discharge plans - SNF vs [place] PharmD, BCPS Pager [phone] 06/17/2017 10:47 AM

## 2017-06-17 NOTE — Telephone Encounter (Signed)
Left VM for patient to call me regarding her re-scheduled orthopaedic procedure so we can create the necessary plan of warfarin interruption and LMWH bridge therapy.

## 2017-06-17 NOTE — Progress Notes (Signed)
Patient ID: Tracy Peters, female   DOB: [birth year], 84 y.o.   MRN: [id number] Subjective: 2 Days Post-Op Procedure(s) (LRB): Right total hip arthroplasty, bursectomy, repair of gluteal tendon, posterior approach (Right) OPEN SURGICAL REPAIR OF GLUTEALmedius TENDON (Right)    Patient reports pain as moderate.  Minimal activity yesterday.  Working on discharge plans based on therapy assessment  Objective:   VITALS:   Vitals:   06/16/17 2219 06/17/17 0610  BP: (!) 128/52 (!) 121/40  Pulse: (!) 106 85  Resp: 15 16  Temp: 98.8 F (37.1 C) 98.8 F (37.1 C)  SpO2: 99% 98%    Neurovascular intact Incision: dressing C/D/I  LABS Recent Labs    06/15/17 1906 06/16/17 0542 06/17/17 0512  HGB 9.1* 7.1* 7.2*  HCT 26.7* 20.5* 20.9*  WBC 11.8* 9.1 9.5  PLT 273 220 245    Recent Labs    06/15/17 1906 06/16/17 0542 06/17/17 0512  NA  --  129* 133*  K  --  4.0 3.8  BUN  --  11 9  CREATININE 0.71 0.66 0.65  GLUCOSE  --  127* 96    Recent Labs    06/16/17 0542 06/17/17 0512  INR 1.09 1.43     Assessment/Plan: 2 Days Post-Op Procedure(s) (LRB): Right total hip arthroplasty, bursectomy, repair of gluteal tendon, posterior approach (Right) OPEN SURGICAL REPAIR OF GLUTEALmedius TENDON (Right)   Up with therapy Plan for discharge tomorrow - ST SNF if still requires significant assistance with mobility versus home with a lot of support ABLA - stable Hgb for now, with satble vitals Continue IRON

## 2017-06-17 NOTE — Progress Notes (Signed)
Physical Therapy Treatment Patient Details Name: Tracy Peters MRN: [id number] DOB: 1934/03/26 Today's Date: 06/17/2017    History of Present Illness 84 yo female s/p R posterior total hip replacement,  bursectomy, and repair of gluteal tendon. PMH: multiple falls, ACL tear, shoulder surgery.    PT Comments    POD # 2 pm session Assisted out of recliner to amb an increased distance then assisted to bathroom.  Very slow gait and very slow transfer with sit to stand and stand to sit.   Family plans to have pt go home.  Will practice stairs tomorrow.   Follow Up Recommendations  SNF;Home health PT;Supervision/Assistance - 24 hour(pt and family are seeking D/C to home )     Equipment Recommendations  Rolling walker with 5" wheels;3in1 (PT)    Recommendations for Other Services       Precautions / Restrictions Precautions Precautions: Posterior Hip;Fall Precaution Comments: also gluteal tendon repair Restrictions Weight Bearing Restrictions: Yes RLE Weight Bearing: Partial weight bearing RLE Partial Weight Bearing Percentage or Pounds: 50%    Mobility  Bed Mobility               General bed mobility comments: OOB in recliner  Transfers Overall transfer level: Needs assistance Equipment used: Rolling walker (2 wheeled) Transfers: Sit to/from Stand Sit to Stand: Min assist;Mod assist         General transfer comment: very slow process to scoot to edge of recliner and perform sit to stand.  requires 50% VC's to avoid hip flex > 90 degrees.  Required 25% VC's on safety with turns to avoid r hip internal rotation.    Ambulation/Gait Ambulation/Gait assistance: Min assist Ambulation Distance (Feet): 45 Feet Assistive device: Rolling walker (2 wheeled) Gait Pattern/deviations: Step-to pattern;Antalgic;Decreased stance time - right Gait velocity: decreased x 3   General Gait Details: very slow gait with increased time to initiate advancement of R LE due to mild  anxiety/fear of increased pain.  Adheres to her PWB with 25% VC's on proper walker to self distance.     Stairs            Wheelchair Mobility    Modified Rankin (Stroke Patients Only)       Balance                                            Cognition Arousal/Alertness: Awake/alert Behavior During Therapy: WFL for tasks assessed/performed Overall Cognitive Status: Within Functional Limits for tasks assessed                                 General Comments: some anxiety/fear of pain      Exercises      General Comments        Pertinent Vitals/Pain Pain Assessment: 0-10 Pain Score: 8  Pain Location: R hip  Pain Descriptors / Indicators: Grimacing;Tightness Pain Intervention(s): Monitored during session;Limited activity within patient's tolerance;Repositioned;Ice applied    Home Living                      Prior Function            PT Goals (current goals can now be found in the care plan section) Progress towards PT goals: Progressing toward goals    Frequency  7X/week      PT Plan Current plan remains appropriate    Co-evaluation              AM-PAC PT "6 Clicks" Daily Activity  Outcome Measure  Difficulty turning over in bed (including adjusting bedclothes, sheets and blankets)?: Unable Difficulty moving from lying on back to sitting on the side of the bed? : Unable Difficulty sitting down on and standing up from a chair with arms (e.g., wheelchair, bedside commode, etc,.)?: Unable Help needed moving to and from a bed to chair (including a wheelchair)?: A Lot Help needed walking in hospital room?: A Lot Help needed climbing 3-5 steps with a railing? : Total 6 Click Score: 8    End of Session Equipment Utilized During Treatment: Gait belt Activity Tolerance: Patient limited by pain;Patient limited by fatigue Patient left: in chair;with call bell/phone within reach;with family/visitor  present Nurse Communication: Mobility status PT Visit Diagnosis: Muscle weakness (generalized) (M62.81);Difficulty in walking, not elsewhere classified (R26.2);Pain Pain - Right/Left: Right Pain - part of body: Hip     Time: 1510-1605 PT Time Calculation (min) (ACUTE ONLY): 55 min  Charges:  $Gait Training: 23-37 mins $Therapeutic Activity: 23-37 mins                    G Codes:       Rica Koyanagi  PTA [facility]  Acute  Rehab Pager      [phone]

## 2017-06-17 NOTE — Progress Notes (Signed)
Physical Therapy Treatment Patient Details Name: Tracy Peters MRN: [id number] DOB: 07-27-1933 Today's Date: 06/17/2017    History of Present Illness 84 yo female s/p R posterior total hip replacement,  bursectomy, and repair of gluteal tendon. PMH: multiple falls, ACL tear, shoulder surgery.    PT Comments    POD # 2 am session Pt OOB in recliner.  Progressing slowly.  Required increased, increased time to transition from sit to stand and stand to sit.   Assisted with amb to bathroom then in hallway.  Pt struggles to initiate R LE forward due to pain and fear of more pain.  Required increased, increased time to complete gait distance.  Required instruction on THP esp to avoid hip flex > 90 degrees when she sits and stands.   Spouse plans to take her home.  Will see again this afternoon.   Follow Up Recommendations  SNF;Home health PT;Supervision/Assistance - 24 hour(pt and family are seeking D/C to home )     Equipment Recommendations  Rolling walker with 5" wheels;3in1 (PT)    Recommendations for Other Services       Precautions / Restrictions Precautions Precautions: Posterior Hip;Fall Precaution Comments: also gluteal tendon repair Restrictions Weight Bearing Restrictions: Yes RLE Weight Bearing: Partial weight bearing RLE Partial Weight Bearing Percentage or Pounds: 50%    Mobility  Bed Mobility               General bed mobility comments: OOB in recliner  Transfers Overall transfer level: Needs assistance Equipment used: Rolling walker (2 wheeled) Transfers: Sit to/from Stand Sit to Stand: Min assist;Mod assist         General transfer comment: very slow process to scoot to edge of recliner and perform sit to stand.  requires 50% VC's to avoid hip flex > 90 degrees.  Required 25% VC's on safety with turns to avoid r hip internal rotation.    Ambulation/Gait Ambulation/Gait assistance: Min assist Ambulation Distance (Feet): 25 Feet Assistive device:  Rolling walker (2 wheeled) Gait Pattern/deviations: Step-to pattern;Antalgic;Decreased stance time - right Gait velocity: decreased x 3   General Gait Details: very slow gait with increased time to initiate advancement of R LE due to mild anxiety/fear of increased pain.  Adheres to her PWB with 25% VC's on proper walker to self distance.     Stairs            Wheelchair Mobility    Modified Rankin (Stroke Patients Only)       Balance                                            Cognition Arousal/Alertness: Awake/alert Behavior During Therapy: WFL for tasks assessed/performed Overall Cognitive Status: Within Functional Limits for tasks assessed                                 General Comments: some anxiety/fear of pain      Exercises      General Comments        Pertinent Vitals/Pain Pain Assessment: 0-10 Pain Score: 8  Pain Location: R hip  Pain Descriptors / Indicators: Grimacing;Tightness Pain Intervention(s): Monitored during session;Limited activity within patient's tolerance;Repositioned;Ice applied    Home Living  Prior Function            PT Goals (current goals can now be found in the care plan section) Progress towards PT goals: Progressing toward goals    Frequency    7X/week      PT Plan Current plan remains appropriate    Co-evaluation              AM-PAC PT "6 Clicks" Daily Activity  Outcome Measure  Difficulty turning over in bed (including adjusting bedclothes, sheets and blankets)?: Unable Difficulty moving from lying on back to sitting on the side of the bed? : Unable Difficulty sitting down on and standing up from a chair with arms (e.g., wheelchair, bedside commode, etc,.)?: Unable Help needed moving to and from a bed to chair (including a wheelchair)?: A Lot Help needed walking in hospital room?: A Lot Help needed climbing 3-5 steps with a railing? :  Total 6 Click Score: 8    End of Session Equipment Utilized During Treatment: Gait belt Activity Tolerance: Patient limited by pain;Patient limited by fatigue Patient left: in chair;with call bell/phone within reach;with family/visitor present Nurse Communication: Mobility status PT Visit Diagnosis: Muscle weakness (generalized) (M62.81);Difficulty in walking, not elsewhere classified (R26.2);Pain Pain - Right/Left: Right Pain - part of body: Hip     Time: 1308-6578 PT Time Calculation (min) (ACUTE ONLY): 45 min  Charges:  $Gait Training: 23-37 mins $Therapeutic Activity: 8-22 mins                    G Codes:       {Jenell Dobransky  PTA [facility]  Acute  Rehab Pager      [phone]

## 2017-06-18 LAB — PROTIME-INR
INR: 1.62
Prothrombin Time: 19.1 seconds — ABNORMAL HIGH (ref 11.4–15.2)

## 2017-06-18 MED ORDER — DOCUSATE SODIUM 100 MG PO CAPS: 100.0000 mg | ORAL_CAPSULE | Freq: Two times a day (BID) | ORAL | 0 refills | Status: DC

## 2017-06-18 MED ORDER — METHOCARBAMOL 500 MG PO TABS: 500.0000 mg | ORAL_TABLET | Freq: Four times a day (QID) | ORAL | 0 refills | Status: DC | PRN

## 2017-06-18 MED ORDER — FERROUS SULFATE 325 (65 FE) MG PO TABS: 325.0000 mg | ORAL_TABLET | Freq: Three times a day (TID) | ORAL | 3 refills | Status: DC

## 2017-06-18 MED ORDER — HYDROCODONE-ACETAMINOPHEN 7.5-325 MG PO TABS: 1.0000 | ORAL_TABLET | ORAL | 0 refills | Status: DC | PRN

## 2017-06-18 MED ORDER — WARFARIN SODIUM 5 MG PO TABS: 10.0000 mg | ORAL_TABLET | Freq: Once | ORAL | Status: DC

## 2017-06-18 MED ORDER — ENOXAPARIN SODIUM 40 MG/0.4ML ~~LOC~~ SOLN: 40.0000 mg | Freq: Two times a day (BID) | SUBCUTANEOUS | 0 refills | Status: DC

## 2017-06-18 MED ORDER — POLYETHYLENE GLYCOL 3350 17 G PO PACK: 17.0000 g | PACK | Freq: Two times a day (BID) | ORAL | 0 refills | Status: DC

## 2017-06-18 NOTE — Progress Notes (Signed)
ANTICOAGULATION CONSULT NOTE -   Pharmacy Consult for warfarin Indication: Hx DVT, s/p THA  Patient Measurements: Height: 5\' 5"  (165.1 cm) Weight: 121 lb (54.9 kg) IBW/kg (Calculated) : 57  Vital Signs: Temp: 98.6 F (37 C) (01/03 0630) Temp Source: Oral (01/03 0630) BP: 140/52 (01/03 1052) Pulse Rate: 98 (01/03 1052)  Labs: Recent Labs    06/15/17 1906 06/16/17 0542 06/17/17 0512 06/18/17 0534  HGB 9.1* 7.1* 7.2*  --   HCT 26.7* 20.5* 20.9*  --   PLT 273 220 245  --   LABPROT  --  14.0 17.3* 19.1*  INR  --  1.09 1.43 1.62  CREATININE 0.71 0.66 0.65  --     Estimated Creatinine Clearance: 47.8 mL/min (by C-G formula based on SCr of 0.65 mg/dL).   Medications:  Scheduled:  . docusate sodium  100 mg Oral BID  . enoxaparin (LOVENOX) injection  40 mg Subcutaneous Q12H  . ferrous sulfate  325 mg Oral TID PC  . fluticasone  1 spray Each Nare QPM  . losartan  100 mg Oral Daily  . polyethylene glycol  17 g Oral BID  . Warfarin - Pharmacist Dosing Inpatient   Does not apply q1800   Infusions:  . sodium chloride 20 mL/hr at 06/16/17 1100  . methocarbamol (ROBAXIN)  IV Stopped (06/16/17 2140)    Assessment: 84 yo female admitted for L THA, bursectomy, and repair of gluteal tendon.  PMH includes chronic warfarin anticoagulation PTA for hx VTE and hypercoagulable state.  PTA dose reported as 7.5mg  daily except 10mg  on Mondays and was taking Lovenox 80mg  q24 in preparation for the surgery with last dose 12/30.  Note that most recent anticoagulation clinic visit on 12/21 shows low INR 1.3.  Pharmacy was consulted to resume warfarin post procedure.  Lovenox dosing per MD is 40 mg SQ BID instead of PTA Lovenox 80 mg daily (1.5 mg/kg).   06/18/2017 INR 1.62, subtherapeutic but increasing after 3 doses Hgb 7.2 remains decreased but stable post surgery; Plts WNL (CBC on 06/17/17) CrCl > 30 ml/min DI: none  Goal of Therapy:  INR 2-3 Monitor platelets by anticoagulation  protocol: Yes   Plan:  1) Warfarin 10mg  PO x 1 tonight, repeat boosted dose 2) Daily INR 3) d/c enoxaparin once INR >/= 1.8 4) Follow up discharge plans - SNF vs [facility] PharmD, BCPS Pager [phone] 06/18/2017 11:09 AM

## 2017-06-18 NOTE — Progress Notes (Signed)
     Subjective: 3 Days Post-Op Procedure(s) (LRB): Right total hip arthroplasty, bursectomy, repair of gluteal tendon, posterior approach (Right) OPEN SURGICAL REPAIR OF GLUTEALmedius TENDON (Right)   Patient reports pain as mild, pain controlled. No events throughout the night. Feels that she is working well with PT.  Decided that she would like to go home vs SNF.  Ready to be discharged home.   Objective:   VITALS:   Vitals:   06/18/17 0630 06/18/17 1052  BP: 129/63 (!) 140/52  Pulse: 94 98  Resp: 16   Temp: 98.6 F (37 C)   SpO2: 96%     Dorsiflexion/Plantar flexion intact Incision: dressing C/D/I No cellulitis present Compartment soft  LABS Recent Labs    06/15/17 1906 06/16/17 0542 06/17/17 0512  HGB 9.1* 7.1* 7.2*  HCT 26.7* 20.5* 20.9*  WBC 11.8* 9.1 9.5  PLT 273 220 245    Recent Labs    06/15/17 1906 06/16/17 0542 06/17/17 0512  NA  --  129* 133*  K  --  4.0 3.8  BUN  --  11 9  CREATININE 0.71 0.66 0.65  GLUCOSE  --  127* 96     Assessment/Plan: 3 Days Post-Op Procedure(s) (LRB): Right total hip arthroplasty, bursectomy, repair of gluteal tendon, posterior approach (Right) OPEN SURGICAL REPAIR OF GLUTEALmedius TENDON (Right)   Up with therapy Discharge home with home health  Follow up in 2 weeks at [facility]. Follow up with OLIN,Avary Pitsenbarger D in 2 weeks.  Contact information:  [facility] [address], [address] [place]   PAC  06/18/2017, 11:33 AM

## 2017-06-18 NOTE — Progress Notes (Signed)
Occupational Therapy Treatment Patient Details Name: Tracy Peters MRN: [id number] DOB: Jan 12, 1934 Today's Date: 06/18/2017    History of present illness 84 yo female s/p R posterior total hip replacement,  bursectomy, and repair of gluteal tendon. PMH: multiple falls, ACL tear, shoulder surgery.   OT comments  Much improved transition from sit to stand. Continues to have strong pull into IR.  Min cues with AE for LB ads   Follow Up Recommendations  Home health OT;Supervision/Assistance - 24 hour    Equipment Recommendations  3 in 1 bedside commode    Recommendations for Other Services      Precautions / Restrictions Precautions Precautions: Posterior Hip;Fall Precaution Booklet Issued: Yes (comment) Precaution Comments: also gluteal tendon repair Restrictions Weight Bearing Restrictions: Yes RLE Weight Bearing: Partial weight bearing RLE Partial Weight Bearing Percentage or Pounds: 50%       Mobility Bed Mobility               General bed mobility comments: oob  Transfers   Equipment used: Rolling walker (2 wheeled)   Sit to Stand: Min assist         General transfer comment: light steadying assist.  Much quicker transition from sit to stand.  Assist to slide RLE forward and prevent IR when sitting    Balance                                           ADL either performed or assessed with clinical judgement   ADL                       Lower Body Dressing: Minimal assistance;With adaptive equipment;Sit to/from stand   Toilet Transfer: Minimal assistance;Ambulation;RW(chair)       Tub/ Shower Transfer: Walk-in shower;Min guard;Ambulation;Rolling walker     General ADL Comments: pt donned and doffed pants using reacher, donned sock again with sock aide with one cue for each task.  ambulated to bathroom and simulated shower ledge.  Husband reports that she will not get into shower for a week.  Reviewed precautions with all  including being able to wash hair at sink and standing for toilet hygiene.  Pt much better with transition from sit to stand today.  Still needs min A to slide RLE forward and prevent internal rotation during stand to sit     Vision       Perception     Praxis      Cognition Arousal/Alertness: Awake/alert Behavior During Therapy: WFL for tasks assessed/performed Overall Cognitive Status: Within Functional Limits for tasks assessed                                          Exercises     Shoulder Instructions       General Comments      Pertinent Vitals/ Pain       Pain Score: 7  Pain Location: R hip with activity Pain Descriptors / Indicators: Grimacing;Tightness Pain Intervention(s): Limited activity within patient's tolerance;Monitored during session;Premedicated before session;Repositioned;Ice applied  Home Living  Prior Functioning/Environment              Frequency           Progress Toward Goals  OT Goals(current goals can now be found in the care plan section)  Progress towards OT goals: Progressing toward goals     Plan      Co-evaluation                 AM-PAC PT "6 Clicks" Daily Activity     Outcome Measure   Help from another person eating meals?: None Help from another person taking care of personal grooming?: A Little Help from another person toileting, which includes using toliet, bedpan, or urinal?: A Little Help from another person bathing (including washing, rinsing, drying)?: A Little Help from another person to put on and taking off regular upper body clothing?: A Little Help from another person to put on and taking off regular lower body clothing?: A Little 6 Click Score: 19    End of Session    OT Visit Diagnosis: Unsteadiness on feet (R26.81);Other abnormalities of gait and mobility (R26.89);Pain;Muscle weakness (generalized) (M62.81)    Activity Tolerance Patient tolerated treatment well   Patient Left in chair;with call bell/phone within reach;with family/visitor present   Nurse Communication          Time: 1023-1100 OT Time Calculation (min): 37 min  Charges: OT General Charges $OT Visit: 1 Visit OT Treatments $Self Care/Home Management : 23-37 mins  Marica Otter, OTR/L [phone] 06/18/2017   Zyler Hyson 06/18/2017, 11:21 AM

## 2017-06-18 NOTE — Progress Notes (Signed)
Physical Therapy Treatment Patient Details Name: Tracy Peters MRN: [id number] DOB: 06-Mar-1934 Today's Date: 06/18/2017    History of Present Illness 84 yo female s/p R posterior total hip replacement,  bursectomy, and repair of gluteal tendon. PMH: multiple falls, ACL tear, shoulder surgery.    PT Comments    POD # 3 Had spouse "hands on" assist pt with all mobility and practiced stairs.  See mobility details below.   Follow Up Recommendations  (family plans to take pt home)     Equipment Recommendations  Rolling walker with 5" wheels;3in1 (PT)    Recommendations for Other Services       Precautions / Restrictions Precautions Precautions: Posterior Hip;Fall Precaution Booklet Issued: Yes (comment) Precaution Comments: also gluteal tendon repair Restrictions Weight Bearing Restrictions: Yes RLE Weight Bearing: Partial weight bearing RLE Partial Weight Bearing Percentage or Pounds: 50%    Mobility  Bed Mobility               General bed mobility comments: OOB in recliner  Transfers Overall transfer level: Needs assistance Equipment used: Rolling walker (2 wheeled) Transfers: Sit to/from Stand Sit to Stand: Min guard;Supervision         General transfer comment: increased time and <25% VC's on proper tech.  Had spouse "hands on" assist guard   Ambulation/Gait Ambulation/Gait assistance: Supervision;Min guard Ambulation Distance (Feet): 22 Feet Assistive device: Rolling walker (2 wheeled) Gait Pattern/deviations: Step-to pattern;Antalgic;Decreased stance time - right Gait velocity: decreased x 2   General Gait Details: <25% VC's on proper walker to self distance and safety with turns.  Had spouse "hands on" assist pt with direction from therapist    Stairs Stairs: Yes   Stair Management: Backwards;Step to pattern;With walker Number of Stairs: 2 General stair comments: preformed with spouse and daughter "hands on" up backward with walker due to [facility].   Practiced twice.  Performed well.   Wheelchair Mobility    Modified Rankin (Stroke Patients Only)       Balance                                            Cognition Arousal/Alertness: Awake/alert Behavior During Therapy: WFL for tasks assessed/performed Overall Cognitive Status: Within Functional Limits for tasks assessed                                 General Comments: tolerated increased activity with less anxiety      Exercises      General Comments        Pertinent Vitals/Pain Pain Assessment: Faces Pain Score: 7  Faces Pain Scale: Hurts little more Pain Location: R hip with activity Pain Descriptors / Indicators: Grimacing;Tightness;Operative site guarding Pain Intervention(s): Monitored during session;Repositioned;Ice applied;Premedicated before session    Home Living                      Prior Function            PT Goals (current goals can now be found in the care plan section) Progress towards PT goals: Progressing toward goals    Frequency    7X/week      PT Plan Current plan remains appropriate    Co-evaluation              AM-PAC PT "  6 Clicks" Daily Activity  Outcome Measure  Difficulty turning over in bed (including adjusting bedclothes, sheets and blankets)?: Unable Difficulty moving from lying on back to sitting on the side of the bed? : Unable Difficulty sitting down on and standing up from a chair with arms (e.g., wheelchair, bedside commode, etc,.)?: Unable Help needed moving to and from a bed to chair (including a wheelchair)?: A Lot Help needed walking in hospital room?: A Lot Help needed climbing 3-5 steps with a railing? : Total 6 Click Score: 8    End of Session Equipment Utilized During Treatment: Gait belt Activity Tolerance: Patient tolerated treatment well;Patient limited by fatigue Patient left: in chair;with call bell/phone within reach;with family/visitor  present Nurse Communication: (pt has met goals to D/C to home with family) PT Visit Diagnosis: Muscle weakness (generalized) (M62.81);Difficulty in walking, not elsewhere classified (R26.2);Pain Pain - Right/Left: Right Pain - part of body: Hip     Time: 1712-7871 PT Time Calculation (min) (ACUTE ONLY): 28 min  Charges:  $Gait Training: 8-22 mins $Therapeutic Activity: 8-22 mins                    G Codes:       Rica Koyanagi  PTA [facility]  Acute  Rehab Pager      [phone]

## 2017-06-18 NOTE — Progress Notes (Signed)
     Durable Medical Equipment  (From admission, onward)        Start     Ordered   06/17/17 1220  For home use only DME Hospital bed  Once    Question Answer Comment  Patient has (list medical condition): S/P Right total hip arthroplasty, bursectomy, repair of gluteal tendon, posterior approach   The above medical condition requires: Patient requires the ability to reposition frequently   Bed type Semi-electric      06/17/17 1226   06/17/17 1214  For home use only DME Bedside commode  Once    Question:  Patient needs a bedside commode to treat with the following condition  Answer:  Surgery, elective   06/17/17 1213   06/17/17 1214  For home use only DME Walker rolling  Once    Question:  Patient needs a walker to treat with the following condition  Answer:  Surgery, elective   06/17/17 1213

## 2017-06-19 ENCOUNTER — Encounter: Payer: 59 | Admitting: Oncology

## 2017-06-19 ENCOUNTER — Telehealth: Payer: Self-pay

## 2017-06-19 NOTE — Telephone Encounter (Signed)
Pt is on TCM list. Dc'ed after right hip replacement. To follow up with ortho.

## 2017-06-20 ENCOUNTER — Telehealth: Payer: Self-pay | Admitting: Pharmacist

## 2017-06-20 DIAGNOSIS — I10 Essential (primary) hypertension: Secondary | ICD-10-CM | POA: Diagnosis not present

## 2017-06-20 DIAGNOSIS — K589 Irritable bowel syndrome without diarrhea: Secondary | ICD-10-CM | POA: Diagnosis not present

## 2017-06-20 DIAGNOSIS — G5600 Carpal tunnel syndrome, unspecified upper limb: Secondary | ICD-10-CM | POA: Diagnosis not present

## 2017-06-20 DIAGNOSIS — M810 Age-related osteoporosis without current pathological fracture: Secondary | ICD-10-CM | POA: Diagnosis not present

## 2017-06-20 DIAGNOSIS — Z471 Aftercare following joint replacement surgery: Secondary | ICD-10-CM | POA: Diagnosis not present

## 2017-06-20 DIAGNOSIS — S39012D Strain of muscle, fascia and tendon of lower back, subsequent encounter: Secondary | ICD-10-CM | POA: Diagnosis not present

## 2017-06-20 NOTE — Telephone Encounter (Signed)
Received call from patient's husband who then gave the phone to the visiting nurse, Estill Bamberg, RN from [facility] reporting INR = 2.6 on 57.5mg  warfarin per week + Lovneox SQ q12h until INR > 1.8 after discharge from hospital s/p THR. Continue same regimen of warfarin 57.5mg /wk. Re-collect next INR on Tuesday 8-JAN-19 and call results to me. Nursing assessment negative for signs or symptoms of VTE or bleeding.

## 2017-06-22 DIAGNOSIS — K589 Irritable bowel syndrome without diarrhea: Secondary | ICD-10-CM | POA: Diagnosis not present

## 2017-06-22 DIAGNOSIS — M810 Age-related osteoporosis without current pathological fracture: Secondary | ICD-10-CM | POA: Diagnosis not present

## 2017-06-22 DIAGNOSIS — G5600 Carpal tunnel syndrome, unspecified upper limb: Secondary | ICD-10-CM | POA: Diagnosis not present

## 2017-06-22 DIAGNOSIS — S39012D Strain of muscle, fascia and tendon of lower back, subsequent encounter: Secondary | ICD-10-CM | POA: Diagnosis not present

## 2017-06-22 DIAGNOSIS — Z471 Aftercare following joint replacement surgery: Secondary | ICD-10-CM | POA: Diagnosis not present

## 2017-06-22 DIAGNOSIS — I10 Essential (primary) hypertension: Secondary | ICD-10-CM | POA: Diagnosis not present

## 2017-06-22 NOTE — Discharge Summary (Signed)
Physician Discharge Summary  Patient ID: Tracy Peters MRN: [id number] DOB/AGE: 1933/07/29 84 y.o.  Admit date: 06/15/2017 Discharge date: 06/18/2017   Procedures:  Procedure(s) (LRB): Right total hip arthroplasty, bursectomy, repair of gluteal tendon, posterior approach (Right) OPEN SURGICAL REPAIR OF GLUTEALmedius TENDON (Right)  Attending Physician:  Dr. Durene Romans   Admission Diagnoses:   Right hip primary OA / pain, chronic bursitis and gluteal tendon tear  Discharge Diagnoses:  Principal Problem:   S/P right THA, PA  Past Medical History:  Diagnosis Date  . Adenomatous colon polyp   . Allergic rhinitis    uses FLonase nightly  . Anemia   . Arthritis    joint pain  . Blood transfusion   . Bronchitis    hx of-7-38yrs ago  . Carpal tunnel syndrome    numbness and tingling   . Cataracts, bilateral   . Chronic anticoagulation 03/21/2013  . Congenital absence of one kidney   . Diarrhea    takes Immodium daily as needed or Lomotil   . Diverticulosis   . GERD (gastroesophageal reflux disease)    mild  . Herpes    takes Valtrex daily  . History of colon polyps   . History of DVT (deep vein thrombosis) 1998   right arm  . History of staph infection   . Hyperlipidemia    Medical MD doesn't think its absolutely necessary  . Hypertension    takes Losartan daily  . IBS (irritable bowel syndrome)    takes Librarian, academic daily  . Insomnia    takes Ambien nightly as needed  . LBP (low back pain)   . Long term (current) use of anticoagulants    Dr. Cyndie Chime  . Nocturia   . Osteoporosis   . Raynaud's disease   . Sarcoma of left lower extremity (HCC)    Dr. Elie Confer  . 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 Urinary leakage   . UTI (urinary tract infection)     HPI:    Tracy Peters, 84 y.o. female, has a history of pain and functional disability in the right hip(s) due to arthritis and patient has failed  non-surgical conservative treatments for greater than 12 weeks to include NSAID's and/or analgesics, corticosteriod injections, use of assistive devices and activity modification.  Onset of symptoms was gradual starting  years ago with gradually worsening course since that time.The patient noted no past surgery on the right hip(s).  Patient currently rates pain in the right hip at 10 out of 10 with activity. Patient has night pain, worsening of pain with activity and weight bearing, trendelenberg gait, pain that interfers with activities of daily living and pain with passive range of motion. Patient has evidence of periarticular osteophytes and joint space narrowing by imaging studies. This condition presents safety issues increasing the risk of falls. There is no current active infection.  Risks, benefits and expectations were discussed with the patient.  Risks including but not limited to the risk of anesthesia, blood clots, nerve damage, blood vessel damage, failure of the prosthesis, infection and up to and including death.  Patient understand the risks, benefits and expectations and wishes to proceed with surgery.   PCP: Plotnikov, Georgina Quint, MD   Discharged Condition: good  Hospital Course:  Patient underwent the above stated procedure on 06/15/2017. Patient tolerated the procedure well and brought to the recovery room in good condition and subsequently to the floor.  POD #1 BP: 112/61 ; Pulse: 83 ; Temp: 98.7 F (37.1 C) ; Resp: 15 Patient reports pain as moderate to severe per reports to nursing.  No activity yet. Neurovascular intact and incision: dressing C/D/I.   LABS  Basename    HGB     7.1  HCT     20.5   POD #2  BP: 121/40 ; Pulse: 85 ; Temp: 98.8 F (37.1 C) ; Resp: 16 Patient reports pain as moderate.  Minimal activity yesterday.  Working on discharge plans based on therapy assessment. Neurovascular intact and incision: dressing C/D/I.   LABS  Basename    HGB     7.2  HCT      20.9   POD #3  BP: 140/52 ; Pulse: 98 ; Temp: 98.6 F (37 C) ; Resp: 16 Patient reports pain as mild, pain controlled. No events throughout the night. Feels that she is working well with PT.  Decided that she would like to go home vs SNF.  Ready to be discharged home.  Dorsiflexion/plantar flexion intact, incision: dressing C/D/I, no cellulitis present and compartment soft.   LABS   No new labs   Discharge Exam: General appearance: alert, cooperative and no distress Extremities: Homans sign is negative, no sign of DVT, no edema, redness or tenderness in the calves or thighs and no ulcers, gangrene or trophic changes  Disposition: Home with follow up in 2 weeks   Follow-up Information    Durene Romans, MD. Schedule an appointment as soon as possible for a visit in 2 week(s).   Specialty:  Orthopedic Surgery Contact information: [address] [place] [address] [phone]        [facility] Follow up.   Specialty:  Home Health Services Why:  physical therapy Contact information: [address] [place] [address] [place] [address] [phone]        [facility] - Dme Follow up.   Why:  hospital bed, walker, 3n1 Contact information: [address] [place] [address] [phone]           Discharge Instructions    Call MD / Call 911   Complete by:  As directed    If you experience chest pain or shortness of breath, CALL 911 and be transported to the hospital emergency room.  If you develope a fever above 101 F, pus (white drainage) or increased drainage or redness at the wound, or calf pain, call your surgeon's office.   Change dressing   Complete by:  As directed    Maintain surgical dressing until follow up in the clinic. If the edges start to pull up, may reinforce with tape. If the dressing is no longer working, may remove and cover with gauze and tape, but must keep the area dry and clean.  Call with any questions  or concerns.   Constipation Prevention   Complete by:  As directed    Drink plenty of fluids.  Prune juice may be helpful.  You may use a stool softener, such as Colace (over the counter) 100 mg twice a day.  Use MiraLax (over the counter) for constipation as needed.   Diet - low sodium heart healthy   Complete by:  As directed    Discharge instructions   Complete by:  As directed    Maintain surgical dressing until follow up in the clinic. If the edges start to pull up, may reinforce with tape. If the dressing  is no longer working, may remove and cover with gauze and tape, but must keep the area dry and clean.  Follow up in 2 weeks at [facility] Call with any questions or concerns.   Partial weight bearing   Complete by:  As directed    NO ACTIVE ABDUCTION of the right leg.   % Body Weight:  50   Laterality:  right   Extremity:  Lower   NO ACTIVE ABDUCTION of the right leg.   Posterior total hip precautions   Complete by:  As directed    No active abduction   TED hose   Complete by:  As directed    Use stockings (TED hose) for 2 weeks on both leg(s).  You may remove them at night for sleeping.      Allergies as of 06/18/2017      Reactions   Bee Venom Anaphylaxis, Other (See Comments)   Yellow jackets, wasps as well   Ivp Dye [iodinated Diagnostic Agents] Anaphylaxis   Shellfish Allergy Anaphylaxis   Amlodipine Besylate Swelling   Aspirin Other (See Comments)   REACTION: unspecified   Atorvastatin Diarrhea   Cholestyramine Other (See Comments)   REACTION: mouth irritation   Codeine Phosphate Nausea And Vomiting   Demerol [meperidine] Other (See Comments)   Severe Hallucination!!!!   Diphenhydramine Hcl Other (See Comments)   hyperactive   Doxycycline Nausea Only   Gentamicin Sulfate Other (See Comments)   REACTION: unspecified   Meclizine Hcl Other (See Comments)   REACTION: more dizziness on it   Meperidine Hcl Other (See Comments)   Unknown    Metronidazole Other (See Comments)   REACTION: bad taste   Moxifloxacin Other (See Comments)   REACTION: insomnia Able to use Cipro   Sulfamethoxazole Other (See Comments)   Kidney problems   Penicillins Swelling, Rash, Other (See Comments)   Has patient had a PCN reaction causing immediate rash, facial/tongue/throat swelling, SOB or lightheadedness with hypotension: Yes Has patient had a PCN reaction causing severe rash involving mucus membranes or skin necrosis: No Has patient had a PCN reaction that required hospitalization: No Has patient had a PCN reaction occurring within the last 10 years: No If all of the above answers are "NO", then may proceed with Cephalosporin use.      Medication List    STOP taking these medications   acyclovir ointment 5 % Commonly known as:  ZOVIRAX   hydrochlorothiazide 12.5 MG capsule Commonly known as:  MICROZIDE   traMADol 50 MG tablet Commonly known as:  ULTRAM     TAKE these medications   diphenoxylate-atropine 2.5-0.025 MG tablet Commonly known as:  LOMOTIL Take 1 tablet by mouth 2 (two) times daily as needed for diarrhea or loose stools.   docusate sodium 100 MG capsule Commonly known as:  COLACE Take 1 capsule (100 mg total) by mouth 2 (two) times daily. What changed:    when to take this  reasons to take this   enoxaparin 40 MG/0.4ML injection Commonly known as:  LOVENOX Inject 0.4 mLs (40 mg total) into the skin every 12 (twelve) hours. Use along with Coumadin to obtain therapeutic INR. May stop Lovenox once INR >/= 1.8. What changed:    medication strength  how much to take  when to take this  additional instructions   EPINEPHrine 0.3 mg/0.3 mL Soaj injection Commonly known as:  EPI-PEN As dirrected What changed:    how much to take  how to take this  when to take this  additional instructions   ferrous sulfate 325 (65 FE) MG tablet Take 1 tablet (325 mg total) by mouth 3 (three) times daily after  meals.   fish oil-omega-3 fatty acids 1000 MG capsule Take 2 g by mouth daily.   fluticasone 50 MCG/ACT nasal spray Commonly known as:  FLONASE Place 2 sprays into both nostrils daily. What changed:    how much to take  when to take this   HYDROcodone-acetaminophen 7.5-325 MG tablet Commonly known as:  NORCO Take 1-2 tablets by mouth every 4 (four) hours as needed for moderate pain ((score 4 to 6)).   loperamide 2 MG tablet Commonly known as:  IMODIUM A-D Take 2 mg by mouth 4 (four) times daily as needed for diarrhea or loose stools. Will take one by mouth before meals or two as needed.   losartan 100 MG tablet Commonly known as:  COZAAR Take 1 tablet (100 mg total) by mouth daily.   methocarbamol 500 MG tablet Commonly known as:  ROBAXIN Take 1 tablet (500 mg total) by mouth every 6 (six) hours as needed for muscle spasms.   polyethylene glycol packet Commonly known as:  MIRALAX / GLYCOLAX Take 17 g by mouth 2 (two) times daily.   triamcinolone ointment 0.5 % Commonly known as:  KENALOG Apply topically 4 (four) times daily. What changed:    how much to take  when to take this  reasons to take this   valACYclovir 1000 MG tablet Commonly known as:  VALTREX Take 1 tablet (1,000 mg total) by mouth 2 (two) times daily.   Vitamin D3 50000 units Caps Take 1 capsule by mouth every 14 (fourteen) days. What changed:  how much to take   warfarin 5 MG tablet Commonly known as:  COUMADIN Take as directed. If you are unsure how to take this medication, talk to your nurse or doctor. Original instructions:  Take 2 tablets on Mondays of every week. All other days--take 1&1/2 tablets. What changed:    how much to take  how to take this  when to take this  additional instructions   zolpidem 10 MG tablet Commonly known as:  AMBIEN 1 & 1/2 TABLETS (15MG ) BY MOUTH AT BEDTIME AS NEEDED FOR SLEEP, OK TOREPEAT IN 4HRS What changed:    how much to take  how to  take this  when to take this  reasons to take this  additional instructions            Discharge Care Instructions  (From admission, onward)        Start     Ordered   06/18/17 0000  Change dressing    Comments:  Maintain surgical dressing until follow up in the clinic. If the edges start to pull up, may reinforce with tape. If the dressing is no longer working, may remove and cover with gauze and tape, but must keep the area dry and clean.  Call with any questions or concerns.   06/18/17 1146   06/18/17 0000  Partial weight bearing    Comments:  NO ACTIVE ABDUCTION of the right leg.  Question Answer Comment  % Body Weight 50   Laterality right   Extremity Lower      06/18/17 1146       Signed: Anastasio Auerbach. Alberta Lenhard   PA-C  06/22/2017, 2:38 PM

## 2017-06-23 DIAGNOSIS — K589 Irritable bowel syndrome without diarrhea: Secondary | ICD-10-CM | POA: Diagnosis not present

## 2017-06-23 DIAGNOSIS — M810 Age-related osteoporosis without current pathological fracture: Secondary | ICD-10-CM | POA: Diagnosis not present

## 2017-06-23 DIAGNOSIS — G5600 Carpal tunnel syndrome, unspecified upper limb: Secondary | ICD-10-CM | POA: Diagnosis not present

## 2017-06-23 DIAGNOSIS — Z471 Aftercare following joint replacement surgery: Secondary | ICD-10-CM | POA: Diagnosis not present

## 2017-06-23 DIAGNOSIS — S39012D Strain of muscle, fascia and tendon of lower back, subsequent encounter: Secondary | ICD-10-CM | POA: Diagnosis not present

## 2017-06-23 DIAGNOSIS — I10 Essential (primary) hypertension: Secondary | ICD-10-CM | POA: Diagnosis not present

## 2017-06-24 ENCOUNTER — Telehealth: Payer: Self-pay | Admitting: Pharmacist

## 2017-06-24 DIAGNOSIS — Z471 Aftercare following joint replacement surgery: Secondary | ICD-10-CM | POA: Diagnosis not present

## 2017-06-24 DIAGNOSIS — S39012D Strain of muscle, fascia and tendon of lower back, subsequent encounter: Secondary | ICD-10-CM | POA: Diagnosis not present

## 2017-06-24 DIAGNOSIS — K589 Irritable bowel syndrome without diarrhea: Secondary | ICD-10-CM | POA: Diagnosis not present

## 2017-06-24 DIAGNOSIS — G5600 Carpal tunnel syndrome, unspecified upper limb: Secondary | ICD-10-CM | POA: Diagnosis not present

## 2017-06-24 DIAGNOSIS — M810 Age-related osteoporosis without current pathological fracture: Secondary | ICD-10-CM | POA: Diagnosis not present

## 2017-06-24 DIAGNOSIS — I10 Essential (primary) hypertension: Secondary | ICD-10-CM | POA: Diagnosis not present

## 2017-06-24 NOTE — Telephone Encounter (Signed)
Received call from [place] using patient's phone from the patient's home on 8-JAN-19 reporting INR 5.1  Nursing assessment reveals no signs or symptoms of bleeding or embolic events. See additional documentation. Patient recently discharged from hospital to home after [facility] and muscle repair to same side as THR. Sent home on LMWH 40mg  SQ BID + warfarin until INR > 1.8 At last INR this threshold had been achieved and LMWH was discontinued. Patient was reverted to her regimen of warfarin she was on pre-op. There have been no new medications (particularly queried about antibiotics) added per patient and nursing assessment. Appetite somewhat less--perhaps hypoalbunemic secondary to decreased PO intake. Yesterday when call was received I was out of the building and [facility] RN was instructed to communicate to the patient (who was on speaker phone using her own phone) to OMIT dose on Tuesday 8-JAN-19. [facility] RN and patient verbalized understanding of these instructions. Patient is called today as well at 1120h and she acknowledges omitting yesterdays dose. Will resume warfarin today with 5mg  and 7.5mg  on Thursday 10-JAN-19 and have [facility] RN (aware) re-check an INR on Friday 11-JAN-19.

## 2017-06-26 DIAGNOSIS — K589 Irritable bowel syndrome without diarrhea: Secondary | ICD-10-CM | POA: Diagnosis not present

## 2017-06-26 DIAGNOSIS — M810 Age-related osteoporosis without current pathological fracture: Secondary | ICD-10-CM | POA: Diagnosis not present

## 2017-06-26 DIAGNOSIS — S39012D Strain of muscle, fascia and tendon of lower back, subsequent encounter: Secondary | ICD-10-CM | POA: Diagnosis not present

## 2017-06-26 DIAGNOSIS — I10 Essential (primary) hypertension: Secondary | ICD-10-CM | POA: Diagnosis not present

## 2017-06-26 DIAGNOSIS — Z471 Aftercare following joint replacement surgery: Secondary | ICD-10-CM | POA: Diagnosis not present

## 2017-06-26 DIAGNOSIS — G5600 Carpal tunnel syndrome, unspecified upper limb: Secondary | ICD-10-CM | POA: Diagnosis not present

## 2017-06-28 ENCOUNTER — Telehealth: Payer: Self-pay | Admitting: Pharmacist

## 2017-06-28 NOTE — Telephone Encounter (Signed)
Called by [facility] RN at 12:25PM on Friday 11-JAN-19 from the patient's home using landline phone. INR = 2.0 after having omitted one dose of warfarin and reducing to 5mg  alternating with 7.5mg  every other day. Will give 10mg  (2x5mg ) Friday 11-JAN-19; 7.5mg  (1 & 1/2 x 5mg ) on Saturday 12-JAN-19 and Sunday 13-JAN-19. Repeat INR on Monday 14-JAN-19.

## 2017-06-29 DIAGNOSIS — G5600 Carpal tunnel syndrome, unspecified upper limb: Secondary | ICD-10-CM | POA: Diagnosis not present

## 2017-06-29 DIAGNOSIS — Z471 Aftercare following joint replacement surgery: Secondary | ICD-10-CM | POA: Diagnosis not present

## 2017-06-29 DIAGNOSIS — M810 Age-related osteoporosis without current pathological fracture: Secondary | ICD-10-CM | POA: Diagnosis not present

## 2017-06-29 DIAGNOSIS — K589 Irritable bowel syndrome without diarrhea: Secondary | ICD-10-CM | POA: Diagnosis not present

## 2017-06-29 DIAGNOSIS — I10 Essential (primary) hypertension: Secondary | ICD-10-CM | POA: Diagnosis not present

## 2017-06-29 DIAGNOSIS — S39012D Strain of muscle, fascia and tendon of lower back, subsequent encounter: Secondary | ICD-10-CM | POA: Diagnosis not present

## 2017-06-30 ENCOUNTER — Telehealth: Payer: Self-pay | Admitting: Pharmacist

## 2017-06-30 DIAGNOSIS — M810 Age-related osteoporosis without current pathological fracture: Secondary | ICD-10-CM | POA: Diagnosis not present

## 2017-06-30 DIAGNOSIS — S39012D Strain of muscle, fascia and tendon of lower back, subsequent encounter: Secondary | ICD-10-CM | POA: Diagnosis not present

## 2017-06-30 DIAGNOSIS — Z471 Aftercare following joint replacement surgery: Secondary | ICD-10-CM | POA: Diagnosis not present

## 2017-06-30 DIAGNOSIS — K589 Irritable bowel syndrome without diarrhea: Secondary | ICD-10-CM | POA: Diagnosis not present

## 2017-06-30 DIAGNOSIS — G5600 Carpal tunnel syndrome, unspecified upper limb: Secondary | ICD-10-CM | POA: Diagnosis not present

## 2017-06-30 DIAGNOSIS — I10 Essential (primary) hypertension: Secondary | ICD-10-CM | POA: Diagnosis not present

## 2017-06-30 NOTE — Telephone Encounter (Signed)
Becky, RN from [facility] called with in-home POC FS INR = 2.0. Advised her to have patient take 10mg  x 2 days; 7.5mg  day 3 and then INR on Friday.

## 2017-07-01 DIAGNOSIS — G5600 Carpal tunnel syndrome, unspecified upper limb: Secondary | ICD-10-CM | POA: Diagnosis not present

## 2017-07-01 DIAGNOSIS — Z471 Aftercare following joint replacement surgery: Secondary | ICD-10-CM | POA: Diagnosis not present

## 2017-07-01 DIAGNOSIS — K589 Irritable bowel syndrome without diarrhea: Secondary | ICD-10-CM | POA: Diagnosis not present

## 2017-07-01 DIAGNOSIS — I10 Essential (primary) hypertension: Secondary | ICD-10-CM | POA: Diagnosis not present

## 2017-07-01 DIAGNOSIS — M810 Age-related osteoporosis without current pathological fracture: Secondary | ICD-10-CM | POA: Diagnosis not present

## 2017-07-01 DIAGNOSIS — S39012D Strain of muscle, fascia and tendon of lower back, subsequent encounter: Secondary | ICD-10-CM | POA: Diagnosis not present

## 2017-07-03 DIAGNOSIS — M810 Age-related osteoporosis without current pathological fracture: Secondary | ICD-10-CM | POA: Diagnosis not present

## 2017-07-03 DIAGNOSIS — I10 Essential (primary) hypertension: Secondary | ICD-10-CM | POA: Diagnosis not present

## 2017-07-03 DIAGNOSIS — G5600 Carpal tunnel syndrome, unspecified upper limb: Secondary | ICD-10-CM | POA: Diagnosis not present

## 2017-07-03 DIAGNOSIS — S39012D Strain of muscle, fascia and tendon of lower back, subsequent encounter: Secondary | ICD-10-CM | POA: Diagnosis not present

## 2017-07-03 DIAGNOSIS — Z471 Aftercare following joint replacement surgery: Secondary | ICD-10-CM | POA: Diagnosis not present

## 2017-07-03 DIAGNOSIS — K589 Irritable bowel syndrome without diarrhea: Secondary | ICD-10-CM | POA: Diagnosis not present

## 2017-07-06 DIAGNOSIS — I10 Essential (primary) hypertension: Secondary | ICD-10-CM | POA: Diagnosis not present

## 2017-07-06 DIAGNOSIS — Z471 Aftercare following joint replacement surgery: Secondary | ICD-10-CM | POA: Diagnosis not present

## 2017-07-06 DIAGNOSIS — M810 Age-related osteoporosis without current pathological fracture: Secondary | ICD-10-CM | POA: Diagnosis not present

## 2017-07-06 DIAGNOSIS — K589 Irritable bowel syndrome without diarrhea: Secondary | ICD-10-CM | POA: Diagnosis not present

## 2017-07-06 DIAGNOSIS — G5600 Carpal tunnel syndrome, unspecified upper limb: Secondary | ICD-10-CM | POA: Diagnosis not present

## 2017-07-06 DIAGNOSIS — S39012D Strain of muscle, fascia and tendon of lower back, subsequent encounter: Secondary | ICD-10-CM | POA: Diagnosis not present

## 2017-07-08 ENCOUNTER — Telehealth: Payer: Self-pay | Admitting: Pharmacist

## 2017-07-08 DIAGNOSIS — G5600 Carpal tunnel syndrome, unspecified upper limb: Secondary | ICD-10-CM | POA: Diagnosis not present

## 2017-07-08 DIAGNOSIS — K589 Irritable bowel syndrome without diarrhea: Secondary | ICD-10-CM | POA: Diagnosis not present

## 2017-07-08 DIAGNOSIS — S39012D Strain of muscle, fascia and tendon of lower back, subsequent encounter: Secondary | ICD-10-CM | POA: Diagnosis not present

## 2017-07-08 DIAGNOSIS — M810 Age-related osteoporosis without current pathological fracture: Secondary | ICD-10-CM | POA: Diagnosis not present

## 2017-07-08 DIAGNOSIS — Z471 Aftercare following joint replacement surgery: Secondary | ICD-10-CM | POA: Diagnosis not present

## 2017-07-08 DIAGNOSIS — I10 Essential (primary) hypertension: Secondary | ICD-10-CM | POA: Diagnosis not present

## 2017-07-08 NOTE — Telephone Encounter (Signed)
Called by [facility] RN Debby from [facility] performing FS INR at patient's home: INR = 2.3 on 62.5mg /wk warfarin as 1 &1/2 tablets of 5mg  strength tablet (7.5mg ) on Tu/Th/Sa; 2x5mg  (10mg ) all other days. Continue this regimen. Next INR in 1 week. Per RN assessment:  No signs or symptoms of bleeding or embolic events. No new medications. No missed doses. Physical therapy is progressing well RN states.

## 2017-07-10 DIAGNOSIS — G5600 Carpal tunnel syndrome, unspecified upper limb: Secondary | ICD-10-CM | POA: Diagnosis not present

## 2017-07-10 DIAGNOSIS — Z471 Aftercare following joint replacement surgery: Secondary | ICD-10-CM | POA: Diagnosis not present

## 2017-07-10 DIAGNOSIS — I10 Essential (primary) hypertension: Secondary | ICD-10-CM | POA: Diagnosis not present

## 2017-07-10 DIAGNOSIS — M810 Age-related osteoporosis without current pathological fracture: Secondary | ICD-10-CM | POA: Diagnosis not present

## 2017-07-10 DIAGNOSIS — S39012D Strain of muscle, fascia and tendon of lower back, subsequent encounter: Secondary | ICD-10-CM | POA: Diagnosis not present

## 2017-07-10 DIAGNOSIS — K589 Irritable bowel syndrome without diarrhea: Secondary | ICD-10-CM | POA: Diagnosis not present

## 2017-07-13 DIAGNOSIS — Z471 Aftercare following joint replacement surgery: Secondary | ICD-10-CM | POA: Diagnosis not present

## 2017-07-13 DIAGNOSIS — M810 Age-related osteoporosis without current pathological fracture: Secondary | ICD-10-CM | POA: Diagnosis not present

## 2017-07-13 DIAGNOSIS — K589 Irritable bowel syndrome without diarrhea: Secondary | ICD-10-CM | POA: Diagnosis not present

## 2017-07-13 DIAGNOSIS — S39012D Strain of muscle, fascia and tendon of lower back, subsequent encounter: Secondary | ICD-10-CM | POA: Diagnosis not present

## 2017-07-13 DIAGNOSIS — G5600 Carpal tunnel syndrome, unspecified upper limb: Secondary | ICD-10-CM | POA: Diagnosis not present

## 2017-07-13 DIAGNOSIS — I10 Essential (primary) hypertension: Secondary | ICD-10-CM | POA: Diagnosis not present

## 2017-07-15 DIAGNOSIS — K589 Irritable bowel syndrome without diarrhea: Secondary | ICD-10-CM | POA: Diagnosis not present

## 2017-07-15 DIAGNOSIS — M810 Age-related osteoporosis without current pathological fracture: Secondary | ICD-10-CM | POA: Diagnosis not present

## 2017-07-15 DIAGNOSIS — G5600 Carpal tunnel syndrome, unspecified upper limb: Secondary | ICD-10-CM | POA: Diagnosis not present

## 2017-07-15 DIAGNOSIS — Z471 Aftercare following joint replacement surgery: Secondary | ICD-10-CM | POA: Diagnosis not present

## 2017-07-15 DIAGNOSIS — I10 Essential (primary) hypertension: Secondary | ICD-10-CM | POA: Diagnosis not present

## 2017-07-15 DIAGNOSIS — S39012D Strain of muscle, fascia and tendon of lower back, subsequent encounter: Secondary | ICD-10-CM | POA: Diagnosis not present

## 2017-07-16 DIAGNOSIS — M810 Age-related osteoporosis without current pathological fracture: Secondary | ICD-10-CM | POA: Diagnosis not present

## 2017-07-16 DIAGNOSIS — K589 Irritable bowel syndrome without diarrhea: Secondary | ICD-10-CM | POA: Diagnosis not present

## 2017-07-16 DIAGNOSIS — I10 Essential (primary) hypertension: Secondary | ICD-10-CM | POA: Diagnosis not present

## 2017-07-16 DIAGNOSIS — Z471 Aftercare following joint replacement surgery: Secondary | ICD-10-CM | POA: Diagnosis not present

## 2017-07-16 DIAGNOSIS — G5600 Carpal tunnel syndrome, unspecified upper limb: Secondary | ICD-10-CM | POA: Diagnosis not present

## 2017-07-16 DIAGNOSIS — S39012D Strain of muscle, fascia and tendon of lower back, subsequent encounter: Secondary | ICD-10-CM | POA: Diagnosis not present

## 2017-07-20 DIAGNOSIS — K589 Irritable bowel syndrome without diarrhea: Secondary | ICD-10-CM | POA: Diagnosis not present

## 2017-07-20 DIAGNOSIS — S39012D Strain of muscle, fascia and tendon of lower back, subsequent encounter: Secondary | ICD-10-CM | POA: Diagnosis not present

## 2017-07-20 DIAGNOSIS — M810 Age-related osteoporosis without current pathological fracture: Secondary | ICD-10-CM | POA: Diagnosis not present

## 2017-07-20 DIAGNOSIS — Z471 Aftercare following joint replacement surgery: Secondary | ICD-10-CM | POA: Diagnosis not present

## 2017-07-20 DIAGNOSIS — I10 Essential (primary) hypertension: Secondary | ICD-10-CM | POA: Diagnosis not present

## 2017-07-20 DIAGNOSIS — G5600 Carpal tunnel syndrome, unspecified upper limb: Secondary | ICD-10-CM | POA: Diagnosis not present

## 2017-07-21 ENCOUNTER — Telehealth: Payer: Self-pay | Admitting: Pharmacist

## 2017-07-21 DIAGNOSIS — S39012D Strain of muscle, fascia and tendon of lower back, subsequent encounter: Secondary | ICD-10-CM | POA: Diagnosis not present

## 2017-07-21 DIAGNOSIS — K589 Irritable bowel syndrome without diarrhea: Secondary | ICD-10-CM | POA: Diagnosis not present

## 2017-07-21 DIAGNOSIS — Z471 Aftercare following joint replacement surgery: Secondary | ICD-10-CM | POA: Diagnosis not present

## 2017-07-21 DIAGNOSIS — M810 Age-related osteoporosis without current pathological fracture: Secondary | ICD-10-CM | POA: Diagnosis not present

## 2017-07-21 DIAGNOSIS — I10 Essential (primary) hypertension: Secondary | ICD-10-CM | POA: Diagnosis not present

## 2017-07-21 DIAGNOSIS — G5600 Carpal tunnel syndrome, unspecified upper limb: Secondary | ICD-10-CM | POA: Diagnosis not present

## 2017-07-21 NOTE — Telephone Encounter (Signed)
[facility] RN Debby calls reporting FS POC INR = 3.5 on 62.5mg  warfarin/wk. No bleeding reported per her assessment. Will OMIT today's dose; recommence tomorrow by taking 2x5mg  tablets alternating with 1& 1/2 x 5mg  tablets every other day. Re-collect INR on 13-FEB-19 and text or call to me.

## 2017-07-22 DIAGNOSIS — K589 Irritable bowel syndrome without diarrhea: Secondary | ICD-10-CM | POA: Diagnosis not present

## 2017-07-22 DIAGNOSIS — I10 Essential (primary) hypertension: Secondary | ICD-10-CM | POA: Diagnosis not present

## 2017-07-22 DIAGNOSIS — M810 Age-related osteoporosis without current pathological fracture: Secondary | ICD-10-CM | POA: Diagnosis not present

## 2017-07-22 DIAGNOSIS — G5600 Carpal tunnel syndrome, unspecified upper limb: Secondary | ICD-10-CM | POA: Diagnosis not present

## 2017-07-22 DIAGNOSIS — S39012D Strain of muscle, fascia and tendon of lower back, subsequent encounter: Secondary | ICD-10-CM | POA: Diagnosis not present

## 2017-07-22 DIAGNOSIS — Z471 Aftercare following joint replacement surgery: Secondary | ICD-10-CM | POA: Diagnosis not present

## 2017-07-24 DIAGNOSIS — S39012D Strain of muscle, fascia and tendon of lower back, subsequent encounter: Secondary | ICD-10-CM | POA: Diagnosis not present

## 2017-07-24 DIAGNOSIS — Z471 Aftercare following joint replacement surgery: Secondary | ICD-10-CM | POA: Diagnosis not present

## 2017-07-24 DIAGNOSIS — G5600 Carpal tunnel syndrome, unspecified upper limb: Secondary | ICD-10-CM | POA: Diagnosis not present

## 2017-07-24 DIAGNOSIS — I10 Essential (primary) hypertension: Secondary | ICD-10-CM | POA: Diagnosis not present

## 2017-07-24 DIAGNOSIS — M810 Age-related osteoporosis without current pathological fracture: Secondary | ICD-10-CM | POA: Diagnosis not present

## 2017-07-24 DIAGNOSIS — K589 Irritable bowel syndrome without diarrhea: Secondary | ICD-10-CM | POA: Diagnosis not present

## 2017-07-27 DIAGNOSIS — Z471 Aftercare following joint replacement surgery: Secondary | ICD-10-CM | POA: Diagnosis not present

## 2017-07-27 DIAGNOSIS — G5600 Carpal tunnel syndrome, unspecified upper limb: Secondary | ICD-10-CM | POA: Diagnosis not present

## 2017-07-27 DIAGNOSIS — M810 Age-related osteoporosis without current pathological fracture: Secondary | ICD-10-CM | POA: Diagnosis not present

## 2017-07-27 DIAGNOSIS — S39012D Strain of muscle, fascia and tendon of lower back, subsequent encounter: Secondary | ICD-10-CM | POA: Diagnosis not present

## 2017-07-27 DIAGNOSIS — I10 Essential (primary) hypertension: Secondary | ICD-10-CM | POA: Diagnosis not present

## 2017-07-27 DIAGNOSIS — K589 Irritable bowel syndrome without diarrhea: Secondary | ICD-10-CM | POA: Diagnosis not present

## 2017-07-29 ENCOUNTER — Other Ambulatory Visit (INDEPENDENT_AMBULATORY_CARE_PROVIDER_SITE_OTHER): Payer: 59

## 2017-07-29 ENCOUNTER — Ambulatory Visit (INDEPENDENT_AMBULATORY_CARE_PROVIDER_SITE_OTHER): Payer: 59 | Admitting: Internal Medicine

## 2017-07-29 ENCOUNTER — Telehealth: Payer: Self-pay | Admitting: Pharmacist

## 2017-07-29 ENCOUNTER — Encounter: Payer: Self-pay | Admitting: Internal Medicine

## 2017-07-29 VITALS — BP 140/86 | HR 77 | Temp 98.1°F | Ht 65.0 in | Wt 125.0 lb

## 2017-07-29 DIAGNOSIS — D500 Iron deficiency anemia secondary to blood loss (chronic): Secondary | ICD-10-CM

## 2017-07-29 DIAGNOSIS — Z96641 Presence of right artificial hip joint: Secondary | ICD-10-CM | POA: Diagnosis not present

## 2017-07-29 DIAGNOSIS — M810 Age-related osteoporosis without current pathological fracture: Secondary | ICD-10-CM | POA: Diagnosis not present

## 2017-07-29 DIAGNOSIS — E785 Hyperlipidemia, unspecified: Secondary | ICD-10-CM | POA: Diagnosis not present

## 2017-07-29 DIAGNOSIS — I10 Essential (primary) hypertension: Secondary | ICD-10-CM

## 2017-07-29 DIAGNOSIS — S39012D Strain of muscle, fascia and tendon of lower back, subsequent encounter: Secondary | ICD-10-CM | POA: Diagnosis not present

## 2017-07-29 DIAGNOSIS — Z471 Aftercare following joint replacement surgery: Secondary | ICD-10-CM | POA: Diagnosis not present

## 2017-07-29 DIAGNOSIS — R197 Diarrhea, unspecified: Secondary | ICD-10-CM

## 2017-07-29 DIAGNOSIS — D509 Iron deficiency anemia, unspecified: Secondary | ICD-10-CM | POA: Insufficient documentation

## 2017-07-29 DIAGNOSIS — Z86718 Personal history of other venous thrombosis and embolism: Secondary | ICD-10-CM

## 2017-07-29 DIAGNOSIS — K589 Irritable bowel syndrome without diarrhea: Secondary | ICD-10-CM | POA: Diagnosis not present

## 2017-07-29 DIAGNOSIS — G5600 Carpal tunnel syndrome, unspecified upper limb: Secondary | ICD-10-CM | POA: Diagnosis not present

## 2017-07-29 LAB — CBC WITH DIFFERENTIAL/PLATELET
BASOS ABS: 0 10*3/uL (ref 0.0–0.1)
Basophils Relative: 0.6 % (ref 0.0–3.0)
EOS ABS: 0.1 10*3/uL (ref 0.0–0.7)
Eosinophils Relative: 1.6 % (ref 0.0–5.0)
HEMATOCRIT: 37.5 % (ref 36.0–46.0)
HEMOGLOBIN: 12.6 g/dL (ref 12.0–15.0)
LYMPHS PCT: 20.7 % (ref 12.0–46.0)
Lymphs Abs: 1.3 10*3/uL (ref 0.7–4.0)
MCHC: 33.6 g/dL (ref 30.0–36.0)
MCV: 106.8 fl — ABNORMAL HIGH (ref 78.0–100.0)
Monocytes Absolute: 0.6 10*3/uL (ref 0.1–1.0)
Monocytes Relative: 8.9 % (ref 3.0–12.0)
Neutro Abs: 4.4 10*3/uL (ref 1.4–7.7)
Neutrophils Relative %: 68.2 % (ref 43.0–77.0)
PLATELETS: 336 10*3/uL (ref 150.0–400.0)
RBC: 3.51 Mil/uL — AB (ref 3.87–5.11)
RDW: 16.3 % — ABNORMAL HIGH (ref 11.5–15.5)
WBC: 6.5 10*3/uL (ref 4.0–10.5)

## 2017-07-29 LAB — BASIC METABOLIC PANEL
BUN: 13 mg/dL (ref 6–23)
CHLORIDE: 96 meq/L (ref 96–112)
CO2: 27 meq/L (ref 19–32)
Calcium: 9.8 mg/dL (ref 8.4–10.5)
Creatinine, Ser: 0.85 mg/dL (ref 0.40–1.20)
GFR: 68.07 mL/min (ref >60.00)
Glucose, Bld: 93 mg/dL (ref 70–99)
POTASSIUM: 4.1 meq/L (ref 3.5–5.1)
Sodium: 131 mEq/L — ABNORMAL LOW (ref 135–145)

## 2017-07-29 MED ORDER — ZOLPIDEM TARTRATE 10 MG PO TABS: ORAL_TABLET | ORAL | 1 refills | Status: DC

## 2017-07-29 MED ORDER — LOSARTAN POTASSIUM 100 MG PO TABS: 100.0000 mg | ORAL_TABLET | Freq: Every day | ORAL | 3 refills | Status: DC

## 2017-07-29 MED ORDER — FLUTICASONE PROPIONATE 50 MCG/ACT NA SUSP: 2.0000 | Freq: Every day | NASAL | 3 refills | Status: DC

## 2017-07-29 NOTE — Telephone Encounter (Signed)
[facility] RN visiting in home calls reporting INR 3.2 on 62.5mg  warfarin per week. No bleeding signs or symptoms; no sx/sx of VTE. Will decrease to 57.5mg /wk warfarin and repeat INR in [facility] in 12 days--25-FEB-19 at 1100h.

## 2017-07-29 NOTE — Assessment & Plan Note (Signed)
Labs On po iron

## 2017-07-29 NOTE — Assessment & Plan Note (Signed)
Chronic Imodium

## 2017-07-29 NOTE — Assessment & Plan Note (Signed)
Coumadin 

## 2017-07-29 NOTE — Progress Notes (Signed)
Subjective:  Patient ID: Tracy Peters, female    DOB: 01/03/1934  Age: 84 y.o. MRN: [id number]  CC: No chief complaint on file.   HPI NIXON SPARR presents for s/p R hip THR - recovering... On Norco, Robaxin for pain F/u blood clots, HTN, insomnia. In PT at home  Outpatient Medications Prior to Visit  Medication Sig Dispense Refill  . Cholecalciferol (VITAMIN D3) 50000 units CAPS Take 1 capsule by mouth every 14 (fourteen) days. (Patient taking differently: Take 50,000 Units by mouth every 14 (fourteen) days. ) 6 capsule 15  . diphenoxylate-atropine (LOMOTIL) 2.5-0.025 MG tablet Take 1 tablet by mouth 2 (two) times daily as needed for diarrhea or loose stools. 30 tablet 2  . docusate sodium (COLACE) 100 MG capsule Take 1 capsule (100 mg total) by mouth 2 (two) times daily. 10 capsule 0  . enoxaparin (LOVENOX) 40 MG/0.4ML injection Inject 0.4 mLs (40 mg total) into the skin every 12 (twelve) hours. Use along with Coumadin to obtain therapeutic INR. May stop Lovenox once INR >/= 1.8. 20 Syringe 0  . EPINEPHrine 0.3 mg/0.3 mL IJ SOAJ injection As dirrected (Patient taking differently: Inject 0.3 mg into the muscle once. ) 1 Device 3  . ferrous sulfate 325 (65 FE) MG tablet Take 1 tablet (325 mg total) by mouth 3 (three) times daily after meals.  3  . fish oil-omega-3 fatty acids 1000 MG capsule Take 2 g by mouth daily.     . fluticasone (FLONASE) 50 MCG/ACT nasal spray Place 2 sprays into both nostrils daily. (Patient taking differently: Place 1 spray into both nostrils every evening. ) 58 g 3  . HYDROcodone-acetaminophen (NORCO) 7.5-325 MG tablet Take 1-2 tablets by mouth every 4 (four) hours as needed for moderate pain ((score 4 to 6)). 60 tablet 0  . loperamide (IMODIUM A-D) 2 MG tablet Take 2 mg by mouth 4 (four) times daily as needed for diarrhea or loose stools. Will take one by mouth 59min before meals or two as needed.    Marland Kitchen losartan (COZAAR) 100 MG tablet Take 1 tablet (100 mg  total) by mouth daily. 90 tablet 3  . methocarbamol (ROBAXIN) 500 MG tablet Take 1 tablet (500 mg total) by mouth every 6 (six) hours as needed for muscle spasms. 30 tablet 0  . polyethylene glycol (MIRALAX / GLYCOLAX) packet Take 17 g by mouth 2 (two) times daily. 14 each 0  . triamcinolone ointment (KENALOG) 0.5 % Apply topically 4 (four) times daily. (Patient taking differently: Apply 1 application topically 4 (four) times daily as needed (for hemmoroid). ) 30 g 3  . valACYclovir (VALTREX) 1000 MG tablet Take 1 tablet (1,000 mg total) by mouth 2 (two) times daily. 180 tablet 3  . warfarin (COUMADIN) 5 MG tablet Take 2 tablets on Mondays of every week. All other days--take 1&1/2 tablets. (Patient taking differently: Take 7.5-10 mg by mouth See admin instructions. Take 10 mg by mouth daily on Monday and Friday. Take 7.5 mg by mouth daily on all other days) 135 tablet 3  . zolpidem (AMBIEN) 10 MG tablet 1 & 1/2 TABLETS (15MG ) BY MOUTH AT BEDTIME AS NEEDED FOR SLEEP, OK TOREPEAT IN 4HRS (Patient taking differently: Take 10-15 mg by mouth at bedtime as needed for sleep (May take additional 10 mg if needed). ) 135 tablet 1   No facility-administered medications prior to visit.     ROS Review of Systems  Constitutional: Negative for activity change, appetite change, chills, fatigue  and unexpected weight change.  HENT: Negative for congestion, mouth sores and sinus pressure.   Eyes: Negative for visual disturbance.  Respiratory: Negative for cough and chest tightness.   Gastrointestinal: Negative for abdominal pain and nausea.  Genitourinary: Negative for difficulty urinating, frequency and vaginal pain.  Musculoskeletal: Positive for arthralgias, back pain and gait problem.  Skin: Positive for wound. Negative for pallor and rash.  Neurological: Negative for dizziness, tremors, weakness, numbness and headaches.  Psychiatric/Behavioral: Negative for confusion and sleep disturbance.    Objective:    BP 140/86 (BP Location: Left Arm, Patient Position: Sitting, Cuff Size: Normal)   Pulse 77   Temp 98.1 F (36.7 C) (Oral)   Ht 5\' 5"  (1.651 m)   Wt 125 lb (56.7 kg)   SpO2 99%   BMI 20.80 kg/m   BP Readings from Last 3 Encounters:  07/29/17 140/86  06/18/17 132/62  06/01/17 (!) 158/79    Wt Readings from Last 3 Encounters:  07/29/17 125 lb (56.7 kg)  06/15/17 121 lb (54.9 kg)  06/01/17 121 lb (54.9 kg)    Physical Exam  Constitutional: She appears well-developed. No distress.  HENT:  Head: Normocephalic.  Right Ear: External ear normal.  Left Ear: External ear normal.  Nose: Nose normal.  Mouth/Throat: Oropharynx is clear and moist.  Eyes: Conjunctivae are normal. Pupils are equal, round, and reactive to light. Right eye exhibits no discharge. Left eye exhibits no discharge.  Neck: Normal range of motion. Neck supple. No JVD present. No tracheal deviation present. No thyromegaly present.  Cardiovascular: Normal rate, regular rhythm and normal heart sounds.  Pulmonary/Chest: No stridor. No respiratory distress. She has no wheezes.  Abdominal: Soft. Bowel sounds are normal. She exhibits no distension and no mass. There is no tenderness. There is no rebound and no guarding.  Musculoskeletal: She exhibits tenderness. She exhibits no edema.  Lymphadenopathy:    She has no cervical adenopathy.  Neurological: She displays normal reflexes. No cranial nerve deficit. She exhibits normal muscle tone. Coordination abnormal.  Skin: No rash noted. No erythema.  Psychiatric: She has a normal mood and affect. Her behavior is normal. Judgment and thought content normal.  R hip is painful pale Scar clean Walker  Lab Results  Component Value Date   WBC 9.5 06/17/2017   HGB 7.2 (L) 06/17/2017   HCT 20.9 (L) 06/17/2017   PLT 245 06/17/2017   GLUCOSE 96 06/17/2017   CHOL 196 01/03/2016   TRIG 85.0 01/03/2016   HDL 63.10 01/03/2016   LDLDIRECT 129.8 05/31/2012   LDLCALC 116 (H)  01/03/2016   ALT 14 08/18/2016   AST 17 08/18/2016   NA 133 (L) 06/17/2017   K 3.8 06/17/2017   CL 101 06/17/2017   CREATININE 0.65 06/17/2017   BUN 9 06/17/2017   CO2 25 06/17/2017   TSH 2.69 01/03/2016   INR 1.62 06/18/2017    Dg Pelvis Portable  Result Date: 06/15/2017 CLINICAL DATA:  Right hip arthroplasty. EXAM: PORTABLE PELVIS 1-2 VIEWS COMPARISON:  CT 11/09/2008. FINDINGS: Single AP view of the pelvis demonstrates right hip arthroplasty. No periprosthetic fracture or acute hardware complication identified. The left femoral head is located. IMPRESSION: Expected appearance after right hip arthroplasty. Electronically Signed   By: Abigail Miyamoto M.D.   On: 06/15/2017 17:19    Assessment & Plan:   There are no diagnoses linked to this encounter. I am having [place]. Brisby maintain her loperamide, fish oil-omega-3 fatty acids, triamcinolone ointment, diphenoxylate-atropine, EPINEPHrine, fluticasone, losartan,  valACYclovir, zolpidem, warfarin, Vitamin D3, docusate sodium, ferrous sulfate, HYDROcodone-acetaminophen, methocarbamol, polyethylene glycol, and enoxaparin.  No orders of the defined types were placed in this encounter.    Follow-up: No Follow-up on file.  [name], MD

## 2017-07-29 NOTE — Assessment & Plan Note (Signed)
Fish oil

## 2017-07-29 NOTE — Assessment & Plan Note (Signed)
Recovering In PT

## 2017-07-29 NOTE — Assessment & Plan Note (Signed)
Losartan 

## 2017-07-30 DIAGNOSIS — G5600 Carpal tunnel syndrome, unspecified upper limb: Secondary | ICD-10-CM | POA: Diagnosis not present

## 2017-07-30 DIAGNOSIS — Z471 Aftercare following joint replacement surgery: Secondary | ICD-10-CM | POA: Diagnosis not present

## 2017-07-30 DIAGNOSIS — M810 Age-related osteoporosis without current pathological fracture: Secondary | ICD-10-CM | POA: Diagnosis not present

## 2017-07-30 DIAGNOSIS — K589 Irritable bowel syndrome without diarrhea: Secondary | ICD-10-CM | POA: Diagnosis not present

## 2017-07-30 DIAGNOSIS — I10 Essential (primary) hypertension: Secondary | ICD-10-CM | POA: Diagnosis not present

## 2017-07-30 DIAGNOSIS — Z96641 Presence of right artificial hip joint: Secondary | ICD-10-CM | POA: Diagnosis not present

## 2017-07-30 DIAGNOSIS — S39012D Strain of muscle, fascia and tendon of lower back, subsequent encounter: Secondary | ICD-10-CM | POA: Diagnosis not present

## 2017-07-30 DIAGNOSIS — Z96642 Presence of left artificial hip joint: Secondary | ICD-10-CM | POA: Diagnosis not present

## 2017-07-30 DIAGNOSIS — Z4789 Encounter for other orthopedic aftercare: Secondary | ICD-10-CM | POA: Diagnosis not present

## 2017-08-10 ENCOUNTER — Ambulatory Visit (INDEPENDENT_AMBULATORY_CARE_PROVIDER_SITE_OTHER): Payer: 59 | Admitting: Pharmacist

## 2017-08-10 DIAGNOSIS — Z86718 Personal history of other venous thrombosis and embolism: Secondary | ICD-10-CM

## 2017-08-10 DIAGNOSIS — Z7901 Long term (current) use of anticoagulants: Secondary | ICD-10-CM

## 2017-08-10 DIAGNOSIS — D6859 Other primary thrombophilia: Secondary | ICD-10-CM

## 2017-08-10 DIAGNOSIS — Z5181 Encounter for therapeutic drug level monitoring: Secondary | ICD-10-CM | POA: Diagnosis not present

## 2017-08-10 DIAGNOSIS — I742 Embolism and thrombosis of arteries of the upper extremities: Secondary | ICD-10-CM

## 2017-08-10 LAB — POCT INR: INR: 2.3

## 2017-08-10 NOTE — Patient Instructions (Signed)
Patient instructed to take medications as defined in the Anti-coagulation Track section of this encounter.  Patient instructed to take today's dose.  Patient instructed to take 1 & 1/2 tablets by mouth once-daily at [facility] each day except on Monday and Friday when you will take 2 whole tablets of the 5 mg [place] colored tablets.  Patient verbalized understanding of these instructions.

## 2017-08-10 NOTE — Progress Notes (Signed)
Anticoagulation Management Tracy Peters is a 84 y.o. female who reports to the clinic for monitoring of warfarin treatment.    Indication: DVT  Duration: indefinite Supervising physician: Murriel Hopper  Anticoagulation Clinic Visit History: Patient does not report signs/symptoms of bleeding or thromboembolism  Other recent changes: No reported changes in diet, medications, lifestyle Anticoagulation Episode Summary    Current INR goal:   2.0-3.0  TTR:   68.3 % (6.1 y)  Next INR check:   08/31/2017  INR from last check:   2.3 (08/10/2017)  Weekly max warfarin dose:     Target end date:   Indefinite  INR check location:   [facility]  Preferred lab:     Send INR reminders to:   ANT[facility]   Indications   Chronic anticoagulation [Z79.01] DVT HX OF [Z86.718] 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HCC) [I74.2] Primary hypercoagulable state ([place]) [D68.59] [D68.59] Long term (current) use of anticoagulants [Z79.01] [Z79.01]       Comments:         Anticoagulation Care Providers    Provider Role Specialty Phone number   Annia Belt, MD Referring Oncology [phone]      Allergies  Allergen Reactions  . Bee Venom Anaphylaxis and Other (See Comments)    Yellow jackets, wasps as well  . Ivp Dye [Iodinated Diagnostic Agents] Anaphylaxis  . Shellfish Allergy Anaphylaxis  . Amlodipine Besylate Swelling  . Aspirin Other (See Comments)    REACTION: unspecified  . Atorvastatin Diarrhea  . Cholestyramine Other (See Comments)    REACTION: mouth irritation  . Codeine Phosphate Nausea And Vomiting  . Demerol [Meperidine] Other (See Comments)    Severe Hallucination!!!!  . Diphenhydramine Hcl Other (See Comments)    hyperactive  . Doxycycline Nausea Only  . Gentamicin Sulfate Other (See Comments)    REACTION: unspecified  . Meclizine Hcl Other (See Comments)    REACTION: more dizziness on it  . Meperidine Hcl Other (See Comments)    Unknown  . Metronidazole  Other (See Comments)    REACTION: bad taste  . Moxifloxacin Other (See Comments)    REACTION: insomnia Able to use Cipro  . Sulfamethoxazole Other (See Comments)    Kidney problems  . Penicillins Swelling, Rash and Other (See Comments)    Has patient had a PCN reaction causing immediate rash, facial/tongue/throat swelling, SOB or lightheadedness with hypotension: Yes Has patient had a PCN reaction causing severe rash involving mucus membranes or skin necrosis: No Has patient had a PCN reaction that required hospitalization: No Has patient had a PCN reaction occurring within the last 10 years: No If all of the above answers are "NO", then may proceed with Cephalosporin use.    Prior to Admission medications   Medication Sig Start Date End Date Taking? Authorizing Provider  Cholecalciferol (VITAMIN D3) 50000 units CAPS Take 1 capsule by mouth every 14 (fourteen) days. Patient taking differently: Take 50,000 Units by mouth every 14 (fourteen) days.  05/18/17  Yes Plotnikov, Evie Lacks, MD  diphenoxylate-atropine (LOMOTIL) 2.5-0.025 MG tablet Take 1 tablet by mouth 2 (two) times daily as needed for diarrhea or loose stools. 01/27/17  Yes Plotnikov, Evie Lacks, MD  docusate sodium (COLACE) 100 MG capsule Take 1 capsule (100 mg total) by mouth 2 (two) times daily. 06/18/17  Yes Babish, Rodman Key, PA-C  enoxaparin (LOVENOX) 40 MG/0.4ML injection Inject 0.4 mLs (40 mg total) into the skin every 12 (twelve) hours. Use along with Coumadin to obtain therapeutic INR. May  stop Lovenox once INR >/= 1.8. 06/18/17  Yes Babish, Rodman Key, PA-C  EPINEPHrine 0.3 mg/0.3 mL IJ SOAJ injection As dirrected Patient taking differently: Inject 0.3 mg into the muscle once.  01/27/17  Yes Plotnikov, Evie Lacks, MD  ferrous sulfate 325 (65 FE) MG tablet Take 1 tablet (325 mg total) by mouth 3 (three) times daily after meals. 06/18/17  Yes Babish, Rodman Key, PA-C  fish oil-omega-3 fatty acids 1000 MG capsule Take 2 g by mouth daily.     Yes [provider]  fluticasone (FLONASE) 50 MCG/ACT nasal spray Place 2 sprays into both nostrils daily. 07/29/17  Yes Plotnikov, Evie Lacks, MD  HYDROcodone-acetaminophen (NORCO) 7.5-325 MG tablet Take 1-2 tablets by mouth every 4 (four) hours as needed for moderate pain ((score 4 to 6)). 06/18/17  Yes Babish, Rodman Key, PA-C  loperamide (IMODIUM A-D) 2 MG tablet Take 2 mg by mouth 4 (four) times daily as needed for diarrhea or loose stools. Will take one by mouth 26min before meals or two as needed.   Yes [provider]  losartan (COZAAR) 100 MG tablet Take 1 tablet (100 mg total) by mouth daily. 07/29/17  Yes Plotnikov, Evie Lacks, MD  methocarbamol (ROBAXIN) 500 MG tablet Take 1 tablet (500 mg total) by mouth every 6 (six) hours as needed for muscle spasms. 06/18/17  Yes Babish, Rodman Key, PA-C  polyethylene glycol (MIRALAX / GLYCOLAX) packet Take 17 g by mouth 2 (two) times daily. 06/18/17  Yes Babish, Rodman Key, PA-C  triamcinolone ointment (KENALOG) 0.5 % Apply topically 4 (four) times daily. Patient taking differently: Apply 1 application topically 4 (four) times daily as needed (for hemmoroid).  07/13/15  Yes Plotnikov, Evie Lacks, MD  valACYclovir (VALTREX) 1000 MG tablet Take 1 tablet (1,000 mg total) by mouth 2 (two) times daily. 01/27/17  Yes Plotnikov, Evie Lacks, MD  warfarin (COUMADIN) 5 MG tablet Take 2 tablets on Mondays of every week. All other days--take 1&1/2 tablets. Patient taking differently: Take 7.5-10 mg by mouth See admin instructions. Take 10 mg by mouth daily on Monday and Friday. Take 7.5 mg by mouth daily on all other days 01/27/17  Yes Plotnikov, Evie Lacks, MD  zolpidem (AMBIEN) 10 MG tablet 1 & 1/2 TABLETS (15MG ) BY MOUTH AT BEDTIME AS NEEDED FOR SLEEP, OK TOREPEAT IN 4HRS 07/29/17  Yes Plotnikov, Evie Lacks, MD   Past Medical History:  Diagnosis Date  . Adenomatous colon polyp   . Allergic rhinitis    uses FLonase nightly  . Anemia   . Arthritis    joint pain  .  Blood transfusion   . Bronchitis    hx of-7-52yrs ago  . Carpal tunnel syndrome    numbness and tingling   . Cataracts, bilateral   . Chronic anticoagulation 03/21/2013  . Congenital absence of one kidney   . Diarrhea    takes Immodium daily as needed or Lomotil   . Diverticulosis   . GERD (gastroesophageal reflux disease)    mild  . Herpes    takes Valtrex daily  . History of colon polyps   . History of DVT (deep vein thrombosis) 1998   right arm  . History of staph infection   . Hyperlipidemia    Medical MD doesn't think its absolutely necessary  . Hypertension    takes Losartan daily  . IBS (irritable bowel syndrome)    takes Electronics engineer daily  . Insomnia    takes Ambien nightly as needed  . LBP (low back pain)   .  Long term (current) use of anticoagulants    Dr. Beryle Beams  . Nocturia   . Osteoporosis   . Raynaud's disease   . 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 Winfield Cunas  . 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 Urinary leakage   . UTI (urinary tract infection)    Social History   Socioeconomic History  . Marital status: Married    Spouse name: Not on file  . Number of children: 2  . Years of education: Not on file  . Highest education level: Not on file  Social Needs  . Financial resource strain: Not on file  . Food insecurity - worry: Not on file  . Food insecurity - inability: Not on file  . Transportation needs - medical: Not on file  . Transportation needs - non-medical: Not on file  Occupational History  . Occupation: Retired    Fish farm manager: RETIRED  Tobacco Use  . Smoking status: Never Smoker  . Smokeless tobacco: Never Used  Substance and Sexual Activity  . Alcohol use: No    Alcohol/week: 0.0 oz  . Drug use: No  . Sexual activity: No  Other Topics Concern  . Not on file  Social History Narrative  . Not on file   Family History  Problem Relation Age of Onset  . Breast cancer  Sister   . Kidney disease Sister 76       nephritis  . Parkinsonism Mother   . Heart disease Brother   . Osteoarthritis Other   . Ulcerative colitis Sister   . Colon cancer Neg Hx     ASSESSMENT Recent Results: The most recent result is correlated with 60 mg per week: Lab Results  Component Value Date   INR 2.3 08/10/2017   INR 1.62 06/18/2017   INR 1.43 06/17/2017   PROTIME 39.6 (H) 04/03/2015    Anticoagulation Dosing: Description   Take 1 & 1/2 tablets by mouth once-daily at 6PM each day except on Monday and Friday when you will take 2 whole tablets of the 5 mg Peach colored tablets. DR Azucena Freed PATIENT (route warfarin notes and list as authorizing provider for LOS & Follow-up, lab orders and warfarin prescriptions), do not list attending physician      INR today: Therapeutic  PLAN Weekly dose was unchanged  Patient Instructions  Patient instructed to take medications as defined in the Anti-coagulation Track section of this encounter.  Patient instructed to take today's dose.  Patient instructed to take 1 & 1/2 tablets by mouth once-daily at [facility] each day except on Monday and Friday when you will take 2 whole tablets of the 5 mg Peach colored tablets.  Patient verbalized understanding of these instructions.  Patient advised to contact clinic or seek medical attention if signs/symptoms of bleeding or thromboembolism occur.  Patient verbalized understanding by repeating back information and was advised to contact me if further medication-related questions arise. Patient was also provided an information handout.  Follow-up Return in 3 weeks (on 08/31/2017) for Follow up INR at 1100.  Mila Merry Gerarda Fraction, PharmD PGY1 Pharmacy Resident Pager: [phone]  15 minutes spent face-to-face with the patient during the encounter. 50% of time spent on education. 50% of time was spent on point of care INR testing, results interpretation, dose adjustment and documentation in [facility] and  [email or web address]

## 2017-08-10 NOTE — Progress Notes (Signed)
Reviewed thx DrG 

## 2017-08-22 ENCOUNTER — Ambulatory Visit (HOSPITAL_COMMUNITY)
Admission: EM | Admit: 2017-08-22 | Discharge: 2017-08-23 | Disposition: A | Discharge status: Home / Self Care | Payer: 59 | Attending: Emergency Medicine | Admitting: Emergency Medicine

## 2017-08-22 DIAGNOSIS — K219 Gastro-esophageal reflux disease without esophagitis: Secondary | ICD-10-CM | POA: Insufficient documentation

## 2017-08-22 DIAGNOSIS — Z886 Allergy status to analgesic agent status: Secondary | ICD-10-CM | POA: Insufficient documentation

## 2017-08-22 DIAGNOSIS — Z7901 Long term (current) use of anticoagulants: Secondary | ICD-10-CM | POA: Diagnosis not present

## 2017-08-22 DIAGNOSIS — E785 Hyperlipidemia, unspecified: Secondary | ICD-10-CM | POA: Insufficient documentation

## 2017-08-22 DIAGNOSIS — S73004A Unspecified dislocation of right hip, initial encounter: Secondary | ICD-10-CM | POA: Diagnosis not present

## 2017-08-22 DIAGNOSIS — M4712 Other spondylosis with myelopathy, cervical region: Secondary | ICD-10-CM | POA: Diagnosis not present

## 2017-08-22 DIAGNOSIS — Z885 Allergy status to narcotic agent status: Secondary | ICD-10-CM | POA: Insufficient documentation

## 2017-08-22 DIAGNOSIS — Z880 Allergy status to penicillin: Secondary | ICD-10-CM | POA: Diagnosis not present

## 2017-08-22 DIAGNOSIS — I10 Essential (primary) hypertension: Secondary | ICD-10-CM | POA: Insufficient documentation

## 2017-08-22 DIAGNOSIS — I7300 Raynaud's syndrome without gangrene: Secondary | ICD-10-CM | POA: Insufficient documentation

## 2017-08-22 DIAGNOSIS — G4700 Insomnia, unspecified: Secondary | ICD-10-CM | POA: Insufficient documentation

## 2017-08-22 DIAGNOSIS — Z86718 Personal history of other venous thrombosis and embolism: Secondary | ICD-10-CM | POA: Insufficient documentation

## 2017-08-22 DIAGNOSIS — Z881 Allergy status to other antibiotic agents status: Secondary | ICD-10-CM | POA: Insufficient documentation

## 2017-08-22 DIAGNOSIS — K589 Irritable bowel syndrome without diarrhea: Secondary | ICD-10-CM | POA: Diagnosis not present

## 2017-08-22 DIAGNOSIS — Z882 Allergy status to sulfonamides status: Secondary | ICD-10-CM | POA: Diagnosis not present

## 2017-08-22 DIAGNOSIS — Z79899 Other long term (current) drug therapy: Secondary | ICD-10-CM | POA: Diagnosis not present

## 2017-08-22 DIAGNOSIS — R52 Pain, unspecified: Secondary | ICD-10-CM | POA: Diagnosis not present

## 2017-08-22 DIAGNOSIS — M25551 Pain in right hip: Secondary | ICD-10-CM | POA: Diagnosis not present

## 2017-08-22 DIAGNOSIS — T84020A Dislocation of internal right hip prosthesis, initial encounter: Secondary | ICD-10-CM | POA: Insufficient documentation

## 2017-08-22 DIAGNOSIS — D509 Iron deficiency anemia, unspecified: Secondary | ICD-10-CM | POA: Insufficient documentation

## 2017-08-22 DIAGNOSIS — Y92003 Bedroom of unspecified non-institutional (private) residence as the place of occurrence of the external cause: Secondary | ICD-10-CM | POA: Insufficient documentation

## 2017-08-22 DIAGNOSIS — Y792 Prosthetic and other implants, materials and accessory orthopedic devices associated with adverse incidents: Secondary | ICD-10-CM | POA: Insufficient documentation

## 2017-08-22 NOTE — ED Triage Notes (Signed)
Pt presents by EMS for right hip pain after hearing a loud crack tonight. Pt reported prior surgery to right hip in December. 248mcg Fentanyl IVP given during transport.

## 2017-08-22 NOTE — ED Notes (Signed)
Bed: LT53 Expected date:  Expected time:  Means of arrival:  Comments: 84 yo F/Hip pain

## 2017-08-23 ENCOUNTER — Emergency Department (HOSPITAL_COMMUNITY): Payer: 59

## 2017-08-23 ENCOUNTER — Ambulatory Visit (HOSPITAL_COMMUNITY): Payer: 59

## 2017-08-23 ENCOUNTER — Encounter (HOSPITAL_COMMUNITY): Payer: Self-pay | Admitting: Emergency Medicine

## 2017-08-23 ENCOUNTER — Emergency Department (HOSPITAL_COMMUNITY): Payer: 59 | Admitting: Certified Registered Nurse Anesthetist

## 2017-08-23 ENCOUNTER — Encounter (HOSPITAL_COMMUNITY)
Admission: EM | Disposition: A | Discharge status: Home / Self Care | Payer: Self-pay | Source: Home / Self Care | Attending: Emergency Medicine

## 2017-08-23 ENCOUNTER — Other Ambulatory Visit: Payer: Self-pay

## 2017-08-23 DIAGNOSIS — T84028A Dislocation of other internal joint prosthesis, initial encounter: Secondary | ICD-10-CM | POA: Diagnosis not present

## 2017-08-23 DIAGNOSIS — T84020A Dislocation of internal right hip prosthesis, initial encounter: Secondary | ICD-10-CM | POA: Diagnosis not present

## 2017-08-23 DIAGNOSIS — I10 Essential (primary) hypertension: Secondary | ICD-10-CM | POA: Diagnosis not present

## 2017-08-23 DIAGNOSIS — J309 Allergic rhinitis, unspecified: Secondary | ICD-10-CM | POA: Diagnosis not present

## 2017-08-23 DIAGNOSIS — K219 Gastro-esophageal reflux disease without esophagitis: Secondary | ICD-10-CM | POA: Diagnosis not present

## 2017-08-23 DIAGNOSIS — S73004A Unspecified dislocation of right hip, initial encounter: Secondary | ICD-10-CM | POA: Diagnosis not present

## 2017-08-23 HISTORY — PX: HIP CLOSED REDUCTION: SHX983

## 2017-08-23 SURGERY — CLOSED MANIPULATION, JOINT, HIP
Anesthesia: General | Laterality: Right

## 2017-08-23 MED ORDER — LACTATED RINGERS IV SOLN
INTRAVENOUS | Status: DC | PRN
  Administered 2017-08-23: 05:00:00 via INTRAVENOUS

## 2017-08-23 MED ORDER — KETAMINE HCL 10 MG/ML IJ SOLN
INTRAMUSCULAR | Status: AC | PRN
  Administered 2017-08-23: .5 mg via INTRAVENOUS

## 2017-08-23 MED ORDER — SUCCINYLCHOLINE CHLORIDE 20 MG/ML IJ SOLN
INTRAMUSCULAR | Status: DC | PRN
  Administered 2017-08-23: 10 mg via INTRAVENOUS

## 2017-08-23 MED ORDER — PROPOFOL 10 MG/ML IV BOLUS
10.0000 mg | Freq: Once | INTRAVENOUS | Status: AC
  Administered 2017-08-23: 10 mg via INTRAVENOUS
  Filled 2017-08-23: qty 20

## 2017-08-23 MED ORDER — PROPOFOL 10 MG/ML IV BOLUS
INTRAVENOUS | Status: AC | PRN
  Administered 2017-08-23: .5 mg via INTRAVENOUS

## 2017-08-23 MED ORDER — KETAMINE HCL 10 MG/ML IJ SOLN
INTRAMUSCULAR | Status: AC | PRN
  Administered 2017-08-23: 3 mg via INTRAVENOUS
  Administered 2017-08-23: .5 mg via INTRAVENOUS

## 2017-08-23 MED ORDER — PROPOFOL 10 MG/ML IV BOLUS
INTRAVENOUS | Status: AC
Start: 2017-08-23 — End: ?
  Filled 2017-08-23: qty 20

## 2017-08-23 MED ORDER — MORPHINE SULFATE (PF) 4 MG/ML IV SOLN
4.0000 mg | Freq: Once | INTRAVENOUS | Status: AC
  Administered 2017-08-23: 4 mg via INTRAVENOUS
  Filled 2017-08-23: qty 1

## 2017-08-23 MED ORDER — PROPOFOL 10 MG/ML IV BOLUS
INTRAVENOUS | Status: AC | PRN
  Administered 2017-08-23: 3 mg via INTRAVENOUS
  Administered 2017-08-23: .5 mg via INTRAVENOUS

## 2017-08-23 MED ORDER — LIDOCAINE 2% (20 MG/ML) 5 ML SYRINGE
INTRAMUSCULAR | Status: DC | PRN
  Administered 2017-08-23: 60 mg via INTRAVENOUS

## 2017-08-23 MED ORDER — PROPOFOL 10 MG/ML IV BOLUS
INTRAVENOUS | Status: DC | PRN
  Administered 2017-08-23: 40 mg via INTRAVENOUS

## 2017-08-23 MED ORDER — FENTANYL CITRATE (PF) 100 MCG/2ML IJ SOLN
50.0000 ug | Freq: Once | INTRAMUSCULAR | Status: AC
  Administered 2017-08-23: 50 ug via INTRAVENOUS
  Filled 2017-08-23: qty 2

## 2017-08-23 MED ORDER — FENTANYL CITRATE (PF) 100 MCG/2ML IJ SOLN: 25.0000 ug | INTRAMUSCULAR | Status: DC | PRN

## 2017-08-23 MED ORDER — KETAMINE HCL 10 MG/ML IJ SOLN
10.0000 mg | Freq: Once | INTRAMUSCULAR | Status: AC
  Administered 2017-08-23: 10 mg via INTRAVENOUS
  Filled 2017-08-23: qty 1

## 2017-08-23 MED ORDER — HYDROCODONE-ACETAMINOPHEN 7.5-325 MG PO TABS: 1.0000 | ORAL_TABLET | Freq: Four times a day (QID) | ORAL | 0 refills | Status: DC | PRN

## 2017-08-23 MED ORDER — ONDANSETRON 4 MG PO TBDP: 4.0000 mg | ORAL_TABLET | Freq: Three times a day (TID) | ORAL | 0 refills | Status: DC | PRN

## 2017-08-23 MED ORDER — ONDANSETRON HCL 4 MG/2ML IJ SOLN: 4.0000 mg | Freq: Once | INTRAMUSCULAR | Status: DC | PRN

## 2017-08-23 SURGICAL SUPPLY — 21 items
BANDAGE ADH SHEER 1  50/CT (GAUZE/BANDAGES/DRESSINGS) IMPLANT
COVER SURGICAL LIGHT HANDLE (MISCELLANEOUS) IMPLANT
DURAPREP 26ML APPLICATOR (WOUND CARE) IMPLANT
GAUZE SPONGE 4X4 12PLY STRL (GAUZE/BANDAGES/DRESSINGS) IMPLANT
GLOVE BIOGEL M 7.0 STRL (GLOVE) IMPLANT
GLOVE BIOGEL PI IND STRL 7.5 (GLOVE) IMPLANT
GLOVE BIOGEL PI IND STRL 8.5 (GLOVE) IMPLANT
GLOVE BIOGEL PI INDICATOR 7.5 (GLOVE)
GLOVE BIOGEL PI INDICATOR 8.5 (GLOVE)
GLOVE ECLIPSE 8.0 STRL XLNG CF (GLOVE) IMPLANT
GLOVE ORTHO TXT STRL SZ7.5 (GLOVE) IMPLANT
GLOVE SURG ORTHO 8.0 STRL STRW (GLOVE) IMPLANT
GOWN STRL REUS W/TWL LRG LVL3 (GOWN DISPOSABLE) IMPLANT
GOWN STRL REUS W/TWL XL LVL3 (GOWN DISPOSABLE) IMPLANT
IMMOBILIZER KNEE 20 (SOFTGOODS) ×3 IMPLANT
MANIFOLD NEPTUNE II (INSTRUMENTS) IMPLANT
NDL SAFETY ECLIPSE 18X1.5 (NEEDLE) IMPLANT
NEEDLE HYPO 18GX1.5 SHARP (NEEDLE)
POSITIONER SURGICAL ARM (MISCELLANEOUS) IMPLANT
SYR CONTROL 10ML LL (SYRINGE) IMPLANT
TOWEL OR 17X26 10 PK STRL BLUE (TOWEL DISPOSABLE) IMPLANT

## 2017-08-23 NOTE — ED Provider Notes (Signed)
Medical screening examination/treatment/procedure(s) were conducted as a shared visit with non-physician practitioner(s) and myself.  I personally evaluated the patient during the encounter.   EKG Interpretation None     84 year old female here after turning around in bed and having a right hip dislocation.  Patient recently had a hip replaced.  Neurovascular intact.  Attempted to relocate hip after sedation unsuccessfully.  Will consult orthopedics for admission   Lacretia Leigh, MD 08/23/17 [phone]

## 2017-08-23 NOTE — Op Note (Signed)
Date of Surgery: 08/23/2017  INDICATIONS: Tracy Peters is a 84 y.o.-year-old female with a right posterior dislocation of total hip arthroplasty; Tracy Peters was in her normal state of health this evening and was shifting on her couch and felt a pop and pain in her right hip.  She was noted in the emergency department to have a posterior hip dislocation.  She had a failed reduction maneuver performed by the emergency department physician and and I was consulted for definitive management.  The patient did consent to the procedure after discussion of the risks and benefits.  PREOPERATIVE DIAGNOSIS: Right posterior dislocation of total hip  POSTOPERATIVE DIAGNOSIS: Same.  PROCEDURE: Closed reduction of right total hip arthroplasty  SURGEON: Geralynn Rile, M.D.  ASSIST: None.  ANESTHESIA:  general  IV FLUIDS AND URINE: See anesthesia.  ESTIMATED BLOOD LOSS: 0 mL.  IMPLANTS: None  DRAINS: None  COMPLICATIONS: None.  DESCRIPTION OF PROCEDURE: The patient was brought to the operating room and placed supine on the stretcher.  The patient had been signed prior to the procedure and this was documented. The patient had the anesthesia placed by the anesthesiologist.  A time-out was performed to confirm that this was the correct patient, site, side and location.  Preoperative antibiotics were not given due to this being a closed procedure.  Likewise, no formal prep and drape was undertaken.  Once adequate analgesia was indicated by the anesthesia team we proceeded with a closed reduction maneuver.  Operating room assistant stabilize the pelvis with one on the right and one on the left holding gentle stabilizing pressure on the ASIS bilaterally.  Next in line traction as well as internal rotation and flexion of the right lower extremity was utilized.  A palpable reduction was both heard and felt.  Following this maneuver the leg lengths were examined and determined to be equal in both length and rotation.   We next brought in the portable C arm fluoroscopy machine.  We obtained AP and frog laterals that confirmed successful closed reduction of the right total hip arthroplasty.  Next, we did perform dynamic examination where the hip was flexed to about 100 degrees and internally rotated to 60 degrees, the hip was stable through that motion.  We then placed a knee immobilizer on the right lower extremity.  The patient was awoken from general anesthetic with no complications.  She was transferred to PACU in stable condition.  POSTOPERATIVE PLAN: Postoperatively, Tracy Peters may continue her warfarin as before.  We will allow her to discharge home from the PACU.  She will remain in her knee immobilizer while weightbearing and observing posterior hip precautions for the next 2 weeks.   she will follow-up with Dr. Alvan Dame in 2 weeks.

## 2017-08-23 NOTE — Brief Op Note (Signed)
08/23/2017  5:16 AM  PATIENT:  Tracy Peters  84 y.o. female  PRE-OPERATIVE DIAGNOSIS:  dislocated right hip  POST-OPERATIVE DIAGNOSIS:  dislocated right hip  PROCEDURE:  Procedure(s): CLOSED MANIPULATION HIP (Right)  SURGEON:  Surgeon(s) and Role:    * Nicholes Stairs, MD - Primary  PHYSICIAN ASSISTANT:   ASSISTANTS: none   ANESTHESIA:   general  EBL: None  BLOOD ADMINISTERED:none  DRAINS: none   LOCAL MEDICATIONS USED:  NONE  SPECIMEN:  No Specimen  DISPOSITION OF SPECIMEN:  N/A  COUNTS:  YES  TOURNIQUET:  * No tourniquets in log *  DICTATION: .Note written in EPIC  PLAN OF CARE: Discharge to home after PACU  PATIENT DISPOSITION:  PACU - hemodynamically stable.   Delay start of Pharmacological VTE agent (>24hrs) due to surgical blood loss or risk of bleeding: not applicable

## 2017-08-23 NOTE — Anesthesia Preprocedure Evaluation (Addendum)
Anesthesia Evaluation  Patient identified by MRN, date of birth, ID band Patient awake    Reviewed: Allergy & Precautions, H&P , NPO status , Patient's Chart, lab work & pertinent test results  Airway Mallampati: II  TM Distance: >3 FB Neck ROM: Full    Dental no notable dental hx.    Pulmonary neg pulmonary ROS,    Pulmonary exam normal breath sounds clear to auscultation       Cardiovascular hypertension, Pt. on medications + Peripheral Vascular Disease  Normal cardiovascular exam Rhythm:Regular Rate:Normal  ECG: NSR, rate 77   Neuro/Psych  Neuromuscular disease negative neurological ROS  negative psych ROS   GI/Hepatic negative GI ROS, Neg liver ROS,   Endo/Other  negative endocrine ROS  Renal/GU negative Renal ROS  negative genitourinary   Musculoskeletal negative musculoskeletal ROS (+) Arthritis , Right hip persistent bursitis Gluteus medius/minimus tears LBP (low back pain)   Abdominal   Peds negative pediatric ROS (+)  Hematology negative hematology ROS (+) anemia , History of DVT (deep vein thrombosis) Arterial thrombosis    Anesthesia Other Findings Right hip persistent bursitis,  Gluteus medius/minimus tears  Reproductive/Obstetrics negative OB ROS                             Anesthesia Physical  Anesthesia Plan  ASA: III  Anesthesia Plan: General   Post-op Pain Management:    Induction: Intravenous  PONV Risk Score and Plan: 3 and Treatment may vary due to age or medical condition  Airway Management Planned: Mask  Additional Equipment:   Intra-op Plan:   Post-operative Plan: Extubation in OR  Informed Consent: I have reviewed the patients History and Physical, chart, labs and discussed the procedure including the risks, benefits and alternatives for the proposed anesthesia with the patient or authorized representative who has indicated his/her understanding  and acceptance.   Dental advisory given  Plan Discussed with: CRNA  Anesthesia Plan Comments: (ETT if necessary)       Anesthesia Quick Evaluation

## 2017-08-23 NOTE — Anesthesia Postprocedure Evaluation (Signed)
Anesthesia Post Note  Patient: Tracy Peters  Procedure(s) Performed: CLOSED MANIPULATION HIP (Right )     Patient location during evaluation: PACU Anesthesia Type: General Level of consciousness: awake and alert Pain management: pain level controlled Vital Signs Assessment: post-procedure vital signs reviewed and stable Respiratory status: spontaneous breathing, nonlabored ventilation, respiratory function stable and patient connected to nasal cannula oxygen Cardiovascular status: blood pressure returned to baseline and stable Postop Assessment: no apparent nausea or vomiting Anesthetic complications: no    Last Vitals:  Vitals:   08/23/17 0545 08/23/17 0629  BP:    Pulse:  (!) 104  Resp:    Temp: 37.1 C 37.1 C  SpO2:  97%    Last Pain:  Vitals:   08/23/17 0629  TempSrc:   PainSc: 3                  Bader Stubblefield EDWARD

## 2017-08-23 NOTE — Consult Note (Signed)
ORTHOPAEDIC CONSULTATION  REQUESTING PHYSICIAN: Lacretia Leigh, MD  PCP:  Cassandria Anger, MD  Chief Complaint: Right dislocated total hip  HPI: Tracy Peters is a 84 y.o. female who complains of right hip pain.  She is just about 2-1/2 months status post right posterior total hip arthroplasty with concomitant gluteus tendon repair.  She has done very well up until this point.  She was rolling over in bed earlier this morning and felt a pop and immediate hip pain.  She presented to the [facility] long emergency department and was found radiographically to have posteriorly dislocated total hip.  They were unable to successfully close reduce the hip in the emergency department.  I was consulted for definitive management.  She currently is only complaining of right hip pain.  She denies numbness or distal symptoms.  Past Medical History:  Diagnosis Date  . Adenomatous colon polyp   . Allergic rhinitis    uses FLonase nightly  . Anemia   . Arthritis    joint pain  . Blood transfusion   . Bronchitis    hx of-7-26yrs ago  . Carpal tunnel syndrome    numbness and tingling   . Cataracts, bilateral   . Chronic anticoagulation 03/21/2013  . Congenital absence of one kidney   . Diarrhea    takes Immodium daily as needed or Lomotil   . Diverticulosis   . GERD (gastroesophageal reflux disease)    mild  . Herpes    takes Valtrex daily  . History of colon polyps   . History of DVT (deep vein thrombosis) 1998   right arm  . History of staph infection   . Hyperlipidemia    Medical MD doesn't think its absolutely necessary  . Hypertension    takes Losartan daily  . IBS (irritable bowel syndrome)    takes Electronics engineer daily  . Insomnia    takes Ambien nightly as needed  . LBP (low back pain)   . Long term (current) use of anticoagulants    Dr. Beryle Beams  . Nocturia   . Osteoporosis   . Raynaud's disease   . 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 Winfield Cunas  . 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 Urinary leakage   . UTI (urinary tract infection)    Past Surgical History:  Procedure Laterality Date  . ABDOMINAL HYSTERECTOMY    . ANTERIOR CERVICAL DECOMP/DISCECTOMY FUSION N/A 01/18/2014   Procedure: ANTERIOR CERVICAL DECOMPRESSION/DISCECTOMY FUSION 3 LEVELS  Cervical  four/five, five/six, six/seven anterior cervical decompression with fusion interbody prosthesis with plating and bonegraft;  Surgeon: Newman Pies, MD;  Location: [place] [facility];  Service: Neurosurgery;  Laterality: N/A;  t  . APPENDECTOMY    . BACK SURGERY     2  . BREAST SURGERY     cystectomy-benign  . CARPAL TUNNEL RELEASE Right 06/12/2014   Procedure: CARPAL TUNNEL RELEASE;  Surgeon: Newman Pies, MD;  Location: [place] [facility];  Service: Neurosurgery;  Laterality: Right;  Right Carpal Tunnel Release  . CATARACT EXTRACTION Bilateral 02/15/2015  . CHOLECYSTECTOMY    . COLONOSCOPY    . I&D of abdomen  1988   couple of wks after gallbladder removed  . KNEE SURGERY     left x 3  . LUMBAR LAMINECTOMY     X 2  . mortons neuromas removed    . OPEN SURGICAL REPAIR OF GLUTEAL TENDON Right 06/15/2017  Procedure: OPEN SURGICAL REPAIR OF GLUTEALmedius TENDON;  Surgeon: Paralee Cancel, MD;  Location: [facility];  Service: Orthopedics;  Laterality: Right;  . SHOULDER SURGERY     Right  . TOTAL HIP ARTHROPLASTY Right 06/15/2017   Procedure: Right total hip arthroplasty, bursectomy, repair of gluteal tendon, posterior approach;  Surgeon: Paralee Cancel, MD;  Location: [facility];  Service: Orthopedics;  Laterality: Right;  90 mins for all procedures together   Social History   Socioeconomic History  . Marital status: Married    Spouse name: None  . Number of children: 2  . Years of education: None  . Highest education level: None  Social Needs  . Financial resource strain: None  . Food insecurity - worry: None  . Food insecurity - inability: None   . Transportation needs - medical: None  . Transportation needs - non-medical: None  Occupational History  . Occupation: Retired    Fish farm manager: RETIRED  Tobacco Use  . Smoking status: Never Smoker  . Smokeless tobacco: Never Used  Substance and Sexual Activity  . Alcohol use: No    Alcohol/week: 0.0 oz  . Drug use: No  . Sexual activity: No  Other Topics Concern  . None  Social History Narrative  . None   Family History  Problem Relation Age of Onset  . Breast cancer Sister   . Kidney disease Sister 69       nephritis  . Parkinsonism Mother   . Heart disease Brother   . Osteoarthritis Other   . Ulcerative colitis Sister   . Colon cancer Neg Hx    Allergies  Allergen Reactions  . Bee Venom Anaphylaxis and Other (See Comments)    Yellow jackets, wasps as well  . Ivp Dye [Iodinated Diagnostic Agents] Anaphylaxis  . Shellfish Allergy Anaphylaxis  . Amlodipine Besylate Swelling  . Aspirin Other (See Comments)    REACTION: unspecified  . Atorvastatin Diarrhea  . Cholestyramine Other (See Comments)    REACTION: mouth irritation  . Codeine Phosphate Nausea And Vomiting  . Demerol [Meperidine] Other (See Comments)    Severe Hallucination!!!!  . Diphenhydramine Hcl Other (See Comments)    hyperactive  . Doxycycline Nausea Only  . Gentamicin Sulfate Other (See Comments)    REACTION: unspecified  . Meclizine Hcl Other (See Comments)    REACTION: more dizziness on it  . Meperidine Hcl Other (See Comments)    Unknown  . Metronidazole Other (See Comments)    REACTION: bad taste  . Moxifloxacin Other (See Comments)    REACTION: insomnia Able to use Cipro  . Sulfamethoxazole Other (See Comments)    Kidney problems  . Penicillins Swelling, Rash and Other (See Comments)    Has patient had a PCN reaction causing immediate rash, facial/tongue/throat swelling, SOB or lightheadedness with hypotension: Yes Has patient had a PCN reaction causing severe rash involving mucus  membranes or skin necrosis: No Has patient had a PCN reaction that required hospitalization: No Has patient had a PCN reaction occurring within the last 10 years: No If all of the above answers are "NO", then may proceed with Cephalosporin use.    Prior to Admission medications   Medication Sig Start Date End Date Taking? Authorizing Provider  Cholecalciferol (VITAMIN D3) 50000 units CAPS Take 1 capsule by mouth every 14 (fourteen) days. Patient taking differently: Take 50,000 Units by mouth every 14 (fourteen) days.  05/18/17  Yes Plotnikov, Evie Lacks, MD  diphenoxylate-atropine (LOMOTIL) 2.5-0.025 MG tablet Take 1 tablet by  mouth 2 (two) times daily as needed for diarrhea or loose stools. 01/27/17  Yes Plotnikov, Evie Lacks, MD  docusate sodium (COLACE) 100 MG capsule Take 1 capsule (100 mg total) by mouth 2 (two) times daily. Patient taking differently: Take 100 mg by mouth daily.  06/18/17  Yes Babish, Rodman Key, PA-C  EPINEPHrine 0.3 mg/0.3 mL IJ SOAJ injection As dirrected Patient taking differently: Inject 0.3 mg into the muscle once.  01/27/17  Yes Plotnikov, Evie Lacks, MD  ferrous sulfate 325 (65 FE) MG tablet Take 1 tablet (325 mg total) by mouth 3 (three) times daily after meals. Patient taking differently: Take 325 mg by mouth 2 (two) times daily.  06/18/17  Yes Babish, Rodman Key, PA-C  fish oil-omega-3 fatty acids 1000 MG capsule Take 2 g by mouth daily.    Yes [provider]  fluticasone (FLONASE) 50 MCG/ACT nasal spray Place 2 sprays into both nostrils daily. Patient taking differently: Place 2 sprays into both nostrils at bedtime.  07/29/17  Yes Plotnikov, Evie Lacks, MD  HYDROcodone-acetaminophen (NORCO) 7.5-325 MG tablet Take 1-2 tablets by mouth every 4 (four) hours as needed for moderate pain ((score 4 to 6)). 06/18/17  Yes Babish, Rodman Key, PA-C  loperamide (IMODIUM A-D) 2 MG tablet Take 2 mg by mouth 4 (four) times daily as needed for diarrhea or loose stools. Will take one by  mouth 55min before meals or two as needed.   Yes [provider]  losartan (COZAAR) 100 MG tablet Take 1 tablet (100 mg total) by mouth daily. 07/29/17  Yes Plotnikov, Evie Lacks, MD  methocarbamol (ROBAXIN) 500 MG tablet Take 1 tablet (500 mg total) by mouth every 6 (six) hours as needed for muscle spasms. 06/18/17  Yes Babish, Rodman Key, PA-C  valACYclovir (VALTREX) 1000 MG tablet Take 1 tablet (1,000 mg total) by mouth 2 (two) times daily. 01/27/17  Yes Plotnikov, Evie Lacks, MD  warfarin (COUMADIN) 5 MG tablet Take 2 tablets on Mondays of every week. All other days--take 1&1/2 tablets. Patient taking differently: Take 7.5-10 mg by mouth See admin instructions. Take 7.5 mg by mouth daily, except take 10 mg on Tuesday and Friday 01/27/17  Yes Plotnikov, Evie Lacks, MD  zolpidem (AMBIEN) 10 MG tablet 1 & 1/2 TABLETS (15MG ) BY MOUTH AT BEDTIME AS NEEDED FOR SLEEP, OK TOREPEAT IN 4HRS 07/29/17  Yes Plotnikov, Evie Lacks, MD  enoxaparin (LOVENOX) 40 MG/0.4ML injection Inject 0.4 mLs (40 mg total) into the skin every 12 (twelve) hours. Use along with Coumadin to obtain therapeutic INR. May stop Lovenox once INR >/= 1.8. Patient not taking: Reported on 08/23/2017 06/18/17   Danae Orleans, PA-C  polyethylene glycol (MIRALAX / GLYCOLAX) packet Take 17 g by mouth 2 (two) times daily. Patient not taking: Reported on 08/23/2017 06/18/17   Danae Orleans, PA-C  triamcinolone ointment (KENALOG) 0.5 % Apply topically 4 (four) times daily. Patient not taking: Reported on 08/23/2017 07/13/15   Plotnikov, Evie Lacks, MD   Dg Hip [place] W Or [place] Pelvis 1 View Right  Result Date: 08/23/2017 CLINICAL DATA:  Post reduction attempt EXAM: DG HIP (WITH OR WITHOUT PELVIS) 1V PORT RIGHT COMPARISON:  08/23/2017 FINDINGS: Status post right hip replacement. Persistent superior dislocation of the right femoral component with respect to the acetabular cup. No fracture seen IMPRESSION: Persistent superior dislocation of the right  femoral component with respect to the acetabular cup. Electronically Signed   By: Donavan Foil M.D.   On: 08/23/2017 03:35   Dg Hip Unilat W  Or Wo Pelvis 2-3 Views Right  Result Date: 08/23/2017 CLINICAL DATA:  Right hip pain EXAM: DG HIP (WITH OR WITHOUT PELVIS) 2-3V RIGHT COMPARISON:  06/15/2017 FINDINGS: Status post right hip replacement. Cephalad dislocation of the right femoral component with respect to the acetabular cup. No fracture seen. IMPRESSION: Cephalad dislocation of the right femoral prosthetic with respect to the acetabular cup. Electronically Signed   By: Donavan Foil M.D.   On: 08/23/2017 01:29    Positive ROS: All other systems have been reviewed and were otherwise negative with the exception of those mentioned in the HPI and as above.  Physical Exam: General: Alert, no acute distress Cardiovascular: No pedal edema Respiratory: No cyanosis, no use of accessory musculature GI: No organomegaly, abdomen is soft and non-tender Skin: No lesions in the area of chief complaint Neurologic: Sensation intact distally Psychiatric: Patient is competent for consent with normal mood and affect Lymphatic: No axillary or cervical lymphadenopathy  MUSCULOSKELETAL: Right lower extremity Shortened and internally rotated.  Otherwise neurovascularly intact with no deficits.  Assessment: Dislocation of right total hip arthroplasty.  Plan: -We discussed closed reduction of the right hip under general anesthesia. -Risk and benefits were reviewed and the patient did provide informed consent. -We will place her in a knee immobilizer intraoperatively and allow discharge home from PACU. -She will follow-up with Dr. Alvan Dame in 2 weeks.    Nicholes Stairs, MD Cell [phone]    08/23/2017 4:31 AM

## 2017-08-23 NOTE — Discharge Instructions (Addendum)
Orthopedics discharge instructions:  -Maintain knee immobilizer at all times, you may weight-bear as tolerated in that knee immobilizer.  It is fine to remove the knee immobilizer to get dressed and bathe. -You will need to schedule a follow-up appointment with Dr. Adriana Mccallum in 2 weeks.

## 2017-08-23 NOTE — Transfer of Care (Signed)
Immediate Anesthesia Transfer of Care Note  Patient: Tracy Peters  Procedure(s) Performed: CLOSED MANIPULATION HIP (Right )  Patient Location: PACU  Anesthesia Type:General  Level of Consciousness: awake, alert  and oriented  Airway & Oxygen Therapy: Patient Spontanous Breathing and Patient connected to face mask oxygen  Post-op Assessment: Report given to RN and Post -op Vital signs reviewed and stable  Post vital signs: Reviewed and stable  Last Vitals:  Vitals:   08/23/17 0405 08/23/17 0410  BP: (!) 186/86 (!) 182/97  Pulse: (!) 104 (!) 114  Resp: 15 (!) 9  Temp:    SpO2: 98% 100%    Last Pain:  Vitals:   08/23/17 0005  TempSrc:   PainSc: 10-Worst pain ever         Complications: No apparent anesthesia complications

## 2017-08-23 NOTE — ED Provider Notes (Signed)
  Physical Exam  BP (!) 200/97 (BP Location: Left Arm)   Pulse 99   Temp 98.1 F (36.7 C) (Oral)   Resp 12   Ht 1.626 m (5\' 4" )   Wt 54.4 kg (120 lb)   SpO2 97%   BMI 20.60 kg/m   Physical Exam  ED Course/Procedures     .Sedation Date/Time: 08/23/2017 3:10 AM Performed by: Lacretia Leigh, MD Authorized by: Lacretia Leigh, MD   Consent:    Consent obtained:  Verbal   Consent given by:  Patient   Risks discussed:  Inadequate sedation   Alternatives discussed:  Analgesia without sedation Universal protocol:    Immediately prior to procedure a time out was called: yes     Patient identity confirmation method:  Arm band Pre-sedation assessment:    Time since last food or drink:  5pm   ASA classification: class 2 - patient with mild systemic disease     Neck mobility: normal     Mouth opening:  3 or more finger widths   Mallampati score:  I - soft palate, uvula, fauces, pillars visible   Pre-sedation assessments completed and reviewed: airway patency   Immediate pre-procedure details:    Reassessment: Patient reassessed immediately prior to procedure     Reviewed: vital signs   Procedure details (see MAR for exact dosages):    Preoxygenation:  Nasal cannula   Sedation:  Etomidate and ketamine   Intra-procedure monitoring:  Blood pressure monitoring and continuous capnometry   Intra-procedure management:  Airway repositioning   Total Provider sedation time (minutes):  330 Post-procedure details:    Attendance: Constant attendance by certified staff until patient recovered     Post-sedation assessments completed and reviewed: airway patency     Patient is stable for discharge or admission: no     Patient tolerance:  Tolerated well, no immediate complications    MDM  Hip relocation was unsuccessful and will consult orthopedics      Lacretia Leigh, MD 08/23/17 [phone]

## 2017-08-23 NOTE — ED Provider Notes (Signed)
[facility] DEPT Provider Note   CSN: [id number] Arrival date & time: 08/22/17  2347     History   Chief Complaint Chief Complaint  Patient presents with  . Hip Pain    HPI Tracy Peters is a 84 y.o. female.  The history is provided by the patient and medical records.  Hip Pain     84 y.o. F with hx of allergic rhinitis, arthritis, cataracts, hx of DVT on lovenox, raynauds disease, insomnia, GERD, herpes, presenting to the ED with right hip pain.  She states she was sitting on the couch watching TV and she shifted her weight and felt a "pop" in her right hip.  Reports immediate onset of severe pain.  States she has not been able to move her leg, get up, or walk.  EMS had to pick her up from the couch and put her on the stretcher.  She had a total hip replacement with Dr. Alvan Dame on 06/15/2017, has been doing well from this.  There were no immediate postop complications.  Past Medical History:  Diagnosis Date  . Adenomatous colon polyp   . Allergic rhinitis    uses FLonase nightly  . Anemia   . Arthritis    joint pain  . Blood transfusion   . Bronchitis    hx of-7-1yr ago  . Carpal tunnel syndrome    numbness and tingling   . Cataracts, bilateral   . Chronic anticoagulation 03/21/2013  . Congenital absence of one kidney   . Diarrhea    takes Immodium daily as needed or Lomotil   . Diverticulosis   . GERD (gastroesophageal reflux disease)    mild  . Herpes    takes Valtrex daily  . History of colon polyps   . History of DVT (deep vein thrombosis) 1998   right arm  . History of staph infection   . Hyperlipidemia    Medical MD doesn't think its absolutely necessary  . Hypertension    takes Losartan daily  . IBS (irritable bowel syndrome)    takes AElectronics engineerdaily  . Insomnia    takes Ambien nightly as needed  . LBP (low back pain)   . Long term (current) use of anticoagulants    Dr. GBeryle Beams . Nocturia   . Osteoporosis   .  Raynaud's disease   . Sarcoma of left lower extremity ([place]    Dr. AWinfield Cunas . 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 Urinary leakage   . UTI (urinary tract infection)     Patient Active Problem List   Diagnosis Date Noted  . Anemia, iron deficiency 07/29/2017  . S/P right THA, PA 06/15/2017  . Right bursectomy 06/01/2017  . Preop exam for internal medicine 05/20/2017  . Cerumen impaction 01/27/2017  . Actinic keratosis 01/27/2017  . Primary hypercoagulable state ([place] [D68.59] 11/17/2016  . Long term (current) use of anticoagulants [Z79.01] 11/17/2016  . Osteopenia 07/14/2016  . Right-sided chest wall pain 04/17/2016  . 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 Dysuria 06/15/2015  . Cervical spondylosis with myelopathy and radiculopathy 01/18/2014  . Cervical radiculitis 12/28/2013  . Left knee pain 12/28/2013  . Paronychia 12/28/2013  . Chronic anticoagulation 03/21/2013  . Well adult exam 06/02/2012  . 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[place] 09/16/2011  . Urticaria 05/28/2011  . DIVERTICULOSIS, COLON 04/30/2010  . COLONIC POLYPS, ADENOMATOUS, HX OF 04/30/2010  .  Dyslipidemia 09/20/2009  . VERTIGO 09/20/2009  . ALLERGIC RHINITIS 03/27/2009  . INSOMNIA, PERSISTENT 09/20/2008  . PARESTHESIA 09/20/2008  . RASH AND OTHER NONSPECIFIC SKIN ERUPTION 03/22/2008  . ACUTE BRONCHITIS 02/22/2008  . Nonspecific (abnormal) findings on radiological and other examination of body structure 02/22/2008  . CHEST XRAY, ABNORMAL 02/22/2008  . GERD 09/21/2007  . Essential hypertension 03/23/2007  . LOW BACK PAIN 03/23/2007  . DVT, HX OF 03/23/2007  . Diarrhea 03/18/2007    Past Surgical History:  Procedure Laterality Date  . ABDOMINAL HYSTERECTOMY    . ANTERIOR CERVICAL DECOMP/DISCECTOMY FUSION N/A 01/18/2014   Procedure: ANTERIOR CERVICAL DECOMPRESSION/DISCECTOMY FUSION 3 LEVELS  Cervical  four/five, five/six, six/seven  anterior cervical decompression with fusion interbody prosthesis with plating and bonegraft;  Surgeon: Newman Pies, MD;  Location: [facility];  Service: Neurosurgery;  Laterality: N/A;  t  . APPENDECTOMY    . BACK SURGERY     2  . BREAST SURGERY     cystectomy-benign  . CARPAL TUNNEL RELEASE Right 06/12/2014   Procedure: CARPAL TUNNEL RELEASE;  Surgeon: Newman Pies, MD;  Location: [facility];  Service: Neurosurgery;  Laterality: Right;  Right Carpal Tunnel Release  . CATARACT EXTRACTION Bilateral 02/15/2015  . CHOLECYSTECTOMY    . COLONOSCOPY    . I&D of abdomen  1988   couple of wks after gallbladder removed  . KNEE SURGERY     left x 3  . LUMBAR LAMINECTOMY     X 2  . mortons neuromas removed    . OPEN SURGICAL REPAIR OF GLUTEAL TENDON Right 06/15/2017   Procedure: OPEN SURGICAL REPAIR OF GLUTEALmedius TENDON;  Surgeon: Paralee Cancel, MD;  Location: [facility];  Service: Orthopedics;  Laterality: Right;  . SHOULDER SURGERY     Right  . TOTAL HIP ARTHROPLASTY Right 06/15/2017   Procedure: Right total hip arthroplasty, bursectomy, repair of gluteal tendon, posterior approach;  Surgeon: Paralee Cancel, MD;  Location: [facility];  Service: Orthopedics;  Laterality: Right;  90 mins for all procedures together    OB History    No data available       Home Medications    Prior to Admission medications   Medication Sig Start Date End Date Taking? Authorizing Provider  Cholecalciferol (VITAMIN D3) 50000 units CAPS Take 1 capsule by mouth every 14 (fourteen) days. Patient taking differently: Take 50,000 Units by mouth every 14 (fourteen) days.  05/18/17   Plotnikov, Evie Lacks, MD  diphenoxylate-atropine (LOMOTIL) 2.5-0.025 MG tablet Take 1 tablet by mouth 2 (two) times daily as needed for diarrhea or loose stools. 01/27/17   Plotnikov, Evie Lacks, MD  docusate sodium (COLACE) 100 MG capsule Take 1 capsule (100 mg total) by mouth 2 (two) times daily. 06/18/17   Danae Orleans, PA-C    enoxaparin (LOVENOX) 40 MG/0.4ML injection Inject 0.4 mLs (40 mg total) into the skin every 12 (twelve) hours. Use along with Coumadin to obtain therapeutic INR. May stop Lovenox once INR >/= 1.8. 06/18/17   Danae Orleans, PA-C  EPINEPHrine 0.3 mg/0.3 mL IJ SOAJ injection As dirrected Patient taking differently: Inject 0.3 mg into the muscle once.  01/27/17   Plotnikov, Evie Lacks, MD  ferrous sulfate 325 (65 FE) MG tablet Take 1 tablet (325 mg total) by mouth 3 (three) times daily after meals. 06/18/17   Danae Orleans, PA-C  fish oil-omega-3 fatty acids 1000 MG capsule Take 2 g by mouth daily.     [provider]  fluticasone (FLONASE) 50  MCG/ACT nasal spray Place 2 sprays into both nostrils daily. 07/29/17   Plotnikov, Evie Lacks, MD  HYDROcodone-acetaminophen (NORCO) 7.5-325 MG tablet Take 1-2 tablets by mouth every 4 (four) hours as needed for moderate pain ((score 4 to 6)). 06/18/17   Danae Orleans, PA-C  loperamide (IMODIUM A-D) 2 MG tablet Take 2 mg by mouth 4 (four) times daily as needed for diarrhea or loose stools. Will take one by mouth 39mn before meals or two as needed.    [provider]  losartan (COZAAR) 100 MG tablet Take 1 tablet (100 mg total) by mouth daily. 07/29/17   Plotnikov, AEvie Lacks MD  methocarbamol (ROBAXIN) 500 MG tablet Take 1 tablet (500 mg total) by mouth every 6 (six) hours as needed for muscle spasms. 06/18/17   BDanae Orleans PA-C  polyethylene glycol (MIRALAX / GLYCOLAX) packet Take 17 g by mouth 2 (two) times daily. 06/18/17   BDanae Orleans PA-C  triamcinolone ointment (KENALOG) 0.5 % Apply topically 4 (four) times daily. Patient taking differently: Apply 1 application topically 4 (four) times daily as needed (for hemmoroid).  07/13/15   Plotnikov, AEvie Lacks MD  valACYclovir (VALTREX) 1000 MG tablet Take 1 tablet (1,000 mg total) by mouth 2 (two) times daily. 01/27/17   Plotnikov, AEvie Lacks MD  warfarin (COUMADIN) 5 MG tablet Take 2 tablets on  Mondays of every week. All other days--take 1&1/2 tablets. Patient taking differently: Take 7.5-10 mg by mouth See admin instructions. Take 10 mg by mouth daily on Monday and Friday. Take 7.5 mg by mouth daily on all other days 01/27/17   Plotnikov, AEvie Lacks MD  zolpidem (AMBIEN) 10 MG tablet 1 & 1/2 TABLETS (15MG) BY MOUTH AT BEDTIME AS NEEDED FOR SLEEP, OK TOREPEAT IN 4HRS 07/29/17   Plotnikov, AEvie Lacks MD    Family History Family History  Problem Relation Age of Onset  . Breast cancer Sister   . Kidney disease Sister 285      nephritis  . Parkinsonism Mother   . Heart disease Brother   . Osteoarthritis Other   . Ulcerative colitis Sister   . Colon cancer Neg Hx     Social History Social History   Tobacco Use  . Smoking status: Never Smoker  . Smokeless tobacco: Never Used  Substance Use Topics  . Alcohol use: No    Alcohol/week: 0.0 oz  . Drug use: No     Allergies   Bee venom; Ivp dye [iodinated diagnostic agents]; Shellfish allergy; Amlodipine besylate; Aspirin; Atorvastatin; Cholestyramine; Codeine phosphate; Demerol [meperidine]; Diphenhydramine hcl; Doxycycline; Gentamicin sulfate; Meclizine hcl; Meperidine hcl; Metronidazole; Moxifloxacin; Sulfamethoxazole; and Penicillins   Review of Systems Review of Systems  Musculoskeletal: Positive for arthralgias.  All other systems reviewed and are negative.    Physical Exam Updated Vital Signs BP (!) 200/97 (BP Location: Left Arm)   Pulse 99   Temp 98.1 F (36.7 C) (Oral)   Resp 12   Ht 5' 4"  (1.626 m)   Wt 54.4 kg (120 lb)   SpO2 97%   BMI 20.60 kg/m   Physical Exam  Constitutional: She is oriented to person, place, and time. She appears well-developed and well-nourished.  HENT:  Head: Normocephalic and atraumatic.  Mouth/Throat: Oropharynx is clear and moist.  Eyes: Conjunctivae and EOM are normal. Pupils are equal, round, and reactive to light.  Neck: Normal range of motion.  Cardiovascular: Normal  rate, regular rhythm and normal heart sounds.  Pulmonary/Chest: Effort normal and breath sounds normal.  No stridor. No respiratory distress.  Abdominal: Soft. Bowel sounds are normal. There is no tenderness. There is no rebound.  Musculoskeletal: Normal range of motion.  Lying awkwardly on left side; right hip and upper leg are extremely TTP no matter where touched, does seem to be a deformity of the right hip to palpation; she will not move leg under any circumstance; no tenderness of the lower leg; DP pulse intact; moving toes normally, normal sensation throughout leg  Neurological: She is alert and oriented to person, place, and time.  Skin: Skin is warm and dry.  Psychiatric: She has a normal mood and affect.  Nursing note and vitals reviewed.    ED Treatments / Results  Labs (all labs ordered are listed, but only abnormal results are displayed) Labs Reviewed - No data to display  EKG  EKG Interpretation None       Radiology Dg Hip Unilat W Or Wo Pelvis 2-3 Views Right  Result Date: 08/23/2017 CLINICAL DATA:  Right hip pain EXAM: DG HIP (WITH OR WITHOUT PELVIS) 2-3V RIGHT COMPARISON:  06/15/2017 FINDINGS: Status post right hip replacement. Cephalad dislocation of the right femoral component with respect to the acetabular cup. No fracture seen. IMPRESSION: Cephalad dislocation of the right femoral prosthetic with respect to the acetabular cup. Electronically Signed   By: Donavan Foil M.D.   On: 08/23/2017 01:29    Procedures Reduction of dislocation Date/Time: 08/23/2017 3:35 AM Performed by: Larene Pickett, PA-C Authorized by: Larene Pickett, PA-C  Consent: Verbal consent obtained. Risks and benefits: risks, benefits and alternatives were discussed Consent given by: patient Patient understanding: patient states understanding of the procedure being performed Required items: required blood products, implants, devices, and special equipment available Patient identity  confirmed: verbally with patient Local anesthesia used: no  Anesthesia: Local anesthesia used: no  Sedation: Patient sedated: yes Sedation type: moderate (conscious) sedation Sedatives: ketamine and propofol Vitals: Vital signs were monitored during sedation.  Patient tolerance: Patient tolerated the procedure well with no immediate complications Comments: Unsuccessful relocation.    (including critical care time)  Medications Ordered in ED Medications  ondansetron (ZOFRAN) injection 4 mg (not administered)  fentaNYL (SUBLIMAZE) injection 25-50 mcg (not administered)  morphine 4 MG/ML injection 4 mg (4 mg Intravenous Given 08/23/17 0041)  propofol (DIPRIVAN) 10 mg/mL bolus/IV push 10 mg (10 mg Intravenous Given 08/23/17 0337)  ketamine (KETALAR) injection 10 mg (10 mg Intravenous Given 08/23/17 0338)  propofol (DIPRIVAN) 10 mg/mL bolus/IV push (0.5 mg Intravenous Given 08/23/17 0319)  ketamine (KETALAR) injection (0.5 mg Intravenous Given 08/23/17 0319)  propofol (DIPRIVAN) 10 mg/mL bolus/IV push (0.5 mg Intravenous Given 08/23/17 0320)  ketamine (KETALAR) injection (0.5 mg Intravenous Given 08/23/17 0320)  fentaNYL (SUBLIMAZE) injection 50 mcg (50 mcg Intravenous Given 08/23/17 0355)     Initial Impression / Assessment and Plan / ED Course  I have reviewed the triage vital signs and the nursing notes.  Pertinent labs & imaging results that were available during my care of the patient were reviewed by me and considered in my medical decision making (see chart for details).  84 year old female presenting to the ED with right hip pain.  She is s/p right THA on 06/15/18 with Dr. Alvan Dame.  She appears to be in sever pain, does seem to be a deformity of the right hip to palpation.  X-ray confirms dislocation.  Patient was sedated in the ED and reduction attempted-- seemed to go back into place as afterwards leg was rotated  normally with normal ROM restored but repeat films with continued  dislocation.  Discussed with on call orthopedics, Dr. Stann Mainland-- will take to OR for reduction.  Patient and family updated.  Final Clinical Impressions(s) / ED Diagnoses   Final diagnoses:  Dislocated hip, right, initial encounter [facility])    ED Discharge Orders    None       Larene Pickett, PA-C 08/23/17 [phone]

## 2017-08-24 ENCOUNTER — Other Ambulatory Visit: Payer: 59

## 2017-08-24 ENCOUNTER — Telehealth: Payer: Self-pay | Admitting: Family Medicine

## 2017-08-24 ENCOUNTER — Encounter (HOSPITAL_COMMUNITY): Payer: Self-pay | Admitting: Orthopedic Surgery

## 2017-08-24 NOTE — Telephone Encounter (Signed)
Left VM for patient. If she calls back please have her speak with a nurse/CMA and just let her know that I wanted to make sure that her hip was taken care of.   If any questions then please take the best time and phone number to call and I will try to call her back.   Rosemarie Ax, MD [facility] 08/24/2017, 2:56 PM

## 2017-08-27 ENCOUNTER — Telehealth: Payer: Self-pay

## 2017-08-27 NOTE — Telephone Encounter (Signed)
Would like a call back from [facility].

## 2017-08-27 NOTE — Telephone Encounter (Signed)
Pt states hip"popped out of socket" on Saturday. Wearing an immobilizer; basically only up to go to the bathroom w/walker. States Paulla Dolly suppose to be setting up home monitoring of PT/INR at home but she has not seen nor talk to anyone.  She sees the ortho doctor on March 27. Taking Coumadin 10 mg on Tues and Fri; other days taking 7.5 mg. And it has been a while since last INR.

## 2017-08-27 NOTE — Telephone Encounter (Signed)
I forwarded to Dr. Elie Confer

## 2017-08-28 ENCOUNTER — Telehealth: Payer: Self-pay | Admitting: Pharmacist

## 2017-08-28 DIAGNOSIS — T84020A Dislocation of internal right hip prosthesis, initial encounter: Secondary | ICD-10-CM

## 2017-08-28 DIAGNOSIS — Z7901 Long term (current) use of anticoagulants: Secondary | ICD-10-CM

## 2017-08-28 NOTE — Telephone Encounter (Signed)
Thanks! DrG 

## 2017-08-28 NOTE — Telephone Encounter (Signed)
Rec'd a call back from [facility] for the PT/INR request to be expedited on 08/31/2017.  Per Jenny Reichmann RN they are unable to just do a PT/INR Stick per Medicare Guidelines.  Patient must have  a Skilled Nursing Need in order to have this request processed.

## 2017-08-28 NOTE — Telephone Encounter (Signed)
I will have Triage Nurse arrange a [facility] RN to call upon the patient in the home setting for fingerstick point of care INR determinations--to be called to me.

## 2017-08-28 NOTE — Telephone Encounter (Signed)
Called by patient on Wednesday 13-MAR-19 indicating she had to have an orthopaedic procedure peformed in the ED and will be home-bound for her next scheduled visit with me. I have asked Triage Nurse to have a FS POC INR requested by [facility] of the patient's choice. Results will be called to me. Scheduled for Monday 18-MAR-19.

## 2017-08-31 ENCOUNTER — Ambulatory Visit: Payer: 59 | Admitting: Oncology

## 2017-08-31 ENCOUNTER — Ambulatory Visit: Payer: 59

## 2017-08-31 NOTE — Telephone Encounter (Signed)
Thanks

## 2017-08-31 NOTE — Telephone Encounter (Signed)
Scott, [facility] RN calls from patient's home reporting FS POC INR = 2.1 on 57.5mg /wk of warfarin. Increased to 60mg /wk warfarin. No bleeding symptoms or signs. No VTE signs or symtomps. No new medications, no missed doses.

## 2017-08-31 NOTE — Telephone Encounter (Signed)
HH order was rec'd on Friday for PT/INR to be done today per our conversation.  Checked with Nurse Bella Kennedy @ [facility] and they are working on getting a Nurse out today for the patient.

## 2017-09-09 DIAGNOSIS — Z96641 Presence of right artificial hip joint: Secondary | ICD-10-CM | POA: Diagnosis not present

## 2017-09-09 DIAGNOSIS — Z471 Aftercare following joint replacement surgery: Secondary | ICD-10-CM | POA: Diagnosis not present

## 2017-09-09 DIAGNOSIS — Z96649 Presence of unspecified artificial hip joint: Secondary | ICD-10-CM | POA: Diagnosis not present

## 2017-09-14 ENCOUNTER — Telehealth: Payer: Self-pay | Admitting: Pharmacist

## 2017-09-14 ENCOUNTER — Telehealth: Payer: Self-pay | Admitting: *Deleted

## 2017-09-14 NOTE — Telephone Encounter (Signed)
Spoke with patient and advised she continue same warfarin regimen--will repeat INR next Monday. Patient had increased her intake of dark green leafy vegetables (turnip greens) to "help with constipation". Given reason for INR, I will not increase her warfarin dose--but did discuss other means/ways of having more "normal" bowel movement(s)/regularity:  Increase water intake, use docusate as a stool-softener. She agreed to this plan. Will repeat FS POC [facility] RN collected specimen next Monday.

## 2017-09-14 NOTE — Telephone Encounter (Signed)
Called by [facility] RN as well--INR reflects that over the weekend, RN cites "patient states she ate turnip greens several times over weekend because she was constipated".  (I have spoken to patient as well, who corroborates this). Given the accounting for the 1.8 value, I will NOT increase dose (will remain on 60mg /wk) but have advised increased fluid intake as well as docusate/stool-softener to help with constipation. I spoke with patient and convey all this information.

## 2017-09-14 NOTE — Telephone Encounter (Signed)
Call from pt - states INR was drawn this am by nurse from [facility]; INR is 1.8. INR given to Dr Elie Confer - stated he will call her.

## 2017-09-15 NOTE — Telephone Encounter (Signed)
FYI - received fax from [facility] , INR 1.9. Thanks

## 2017-09-16 NOTE — Telephone Encounter (Signed)
Patient (as well as [facility] RN) was/were called and advised to continue same warfarin regimen. [facility] RN will re-collect INR 8-APR-19.

## 2017-09-18 ENCOUNTER — Telehealth: Payer: Self-pay | Admitting: Internal Medicine

## 2017-09-18 NOTE — Telephone Encounter (Signed)
Copied from [place] [id number]. Topic: Quick Communication - See Telephone Encounter >> Sep 18, 2017  3:05 PM Cleaster Corin, NT wrote: CRM for notification. See Telephone encounter for: 09/18/17.  Pt. Calling stated that zolpidem (AMBIEN) 10 MG tablet [[id number]] is longer working for her she was seeing if she could possibly get something else   [place], [address] [place] [address] Phone: [phone] Fax: [phone]

## 2017-09-18 NOTE — Telephone Encounter (Signed)
Routing to dr plotnikov, please advise, thanks 

## 2017-09-21 ENCOUNTER — Telehealth: Payer: Self-pay | Admitting: Pharmacist

## 2017-09-21 MED ORDER — TEMAZEPAM 30 MG PO CAPS: 30.0000 mg | ORAL_CAPSULE | Freq: Every evening | ORAL | 0 refills | Status: DC | PRN

## 2017-09-21 NOTE — Telephone Encounter (Signed)
Discussed with patient her INR value 2.6 collected by [facility] RN. Will continue same regimen and have [facility] RN re-collect in 2 weeks.

## 2017-09-21 NOTE — Telephone Encounter (Signed)
Advised patient that temazepam has been sent to [facility]

## 2017-09-21 NOTE — Telephone Encounter (Signed)
Ok - I switched to Temazepam Thx

## 2017-09-23 DIAGNOSIS — I8311 Varicose veins of right lower extremity with inflammation: Secondary | ICD-10-CM | POA: Diagnosis not present

## 2017-09-23 DIAGNOSIS — D485 Neoplasm of uncertain behavior of skin: Secondary | ICD-10-CM | POA: Diagnosis not present

## 2017-09-23 DIAGNOSIS — I8312 Varicose veins of left lower extremity with inflammation: Secondary | ICD-10-CM | POA: Diagnosis not present

## 2017-09-26 ENCOUNTER — Encounter: Payer: Self-pay | Admitting: Internal Medicine

## 2017-10-06 ENCOUNTER — Telehealth: Payer: Self-pay | Admitting: Pharmacist

## 2017-10-06 DIAGNOSIS — C44722 Squamous cell carcinoma of skin of right lower limb, including hip: Secondary | ICD-10-CM | POA: Diagnosis not present

## 2017-10-06 NOTE — Telephone Encounter (Signed)
[facility] RN called reporting FS POC INR 2.6 on 60mg /wk of warfarin (10mg  MWF; 7.5mg  all other days). No bleeding per his assessment. No new medications; no missed doses. Continue same regimen and re-collect FS POC INR in 2 weeks.

## 2017-10-19 ENCOUNTER — Telehealth: Payer: Self-pay | Admitting: Pharmacist

## 2017-10-19 ENCOUNTER — Telehealth: Payer: Self-pay | Admitting: Internal Medicine

## 2017-10-19 NOTE — Telephone Encounter (Signed)
Patient instructed to continue same regimen of 10mg  MWF; 7.5mg  all other days. Must come to [facility] for next INR in 4 weeks.

## 2017-10-19 NOTE — Telephone Encounter (Signed)
Pt called stating she is allergic to  temazepam (RESTORIL) 30 MG capsule She states "it the most allergic thing I am allergic to, one pill could kill me" Dr. Elie Confer is faxing over some documentation on this. She would like it removed from her chart. Pt has appt on Wednesday.

## 2017-10-20 ENCOUNTER — Observation Stay (HOSPITAL_COMMUNITY): Payer: 59

## 2017-10-20 ENCOUNTER — Other Ambulatory Visit: Payer: Self-pay

## 2017-10-20 ENCOUNTER — Emergency Department (HOSPITAL_COMMUNITY): Payer: 59

## 2017-10-20 ENCOUNTER — Observation Stay (HOSPITAL_COMMUNITY)
Admission: EM | Admit: 2017-10-20 | Discharge: 2017-10-22 | Disposition: A | Discharge status: Home / Self Care | Payer: 59 | Attending: Internal Medicine | Admitting: Internal Medicine

## 2017-10-20 ENCOUNTER — Encounter (HOSPITAL_COMMUNITY): Payer: Self-pay | Admitting: Emergency Medicine

## 2017-10-20 DIAGNOSIS — R4020 Unspecified coma: Secondary | ICD-10-CM | POA: Diagnosis not present

## 2017-10-20 DIAGNOSIS — S81801A Unspecified open wound, right lower leg, initial encounter: Secondary | ICD-10-CM | POA: Insufficient documentation

## 2017-10-20 DIAGNOSIS — G459 Transient cerebral ischemic attack, unspecified: Secondary | ICD-10-CM

## 2017-10-20 DIAGNOSIS — M25572 Pain in left ankle and joints of left foot: Secondary | ICD-10-CM | POA: Diagnosis not present

## 2017-10-20 DIAGNOSIS — R410 Disorientation, unspecified: Secondary | ICD-10-CM | POA: Diagnosis not present

## 2017-10-20 DIAGNOSIS — K589 Irritable bowel syndrome without diarrhea: Secondary | ICD-10-CM | POA: Insufficient documentation

## 2017-10-20 DIAGNOSIS — D6859 Other primary thrombophilia: Secondary | ICD-10-CM | POA: Diagnosis not present

## 2017-10-20 DIAGNOSIS — X58XXXA Exposure to other specified factors, initial encounter: Secondary | ICD-10-CM | POA: Insufficient documentation

## 2017-10-20 DIAGNOSIS — G4700 Insomnia, unspecified: Secondary | ICD-10-CM | POA: Insufficient documentation

## 2017-10-20 DIAGNOSIS — R6884 Jaw pain: Secondary | ICD-10-CM | POA: Insufficient documentation

## 2017-10-20 DIAGNOSIS — Q600 Renal agenesis, unilateral: Secondary | ICD-10-CM | POA: Insufficient documentation

## 2017-10-20 DIAGNOSIS — Z79899 Other long term (current) drug therapy: Secondary | ICD-10-CM | POA: Insufficient documentation

## 2017-10-20 DIAGNOSIS — R52 Pain, unspecified: Secondary | ICD-10-CM

## 2017-10-20 DIAGNOSIS — B009 Herpesviral infection, unspecified: Secondary | ICD-10-CM | POA: Diagnosis not present

## 2017-10-20 DIAGNOSIS — R569 Unspecified convulsions: Principal | ICD-10-CM

## 2017-10-20 DIAGNOSIS — Z79891 Long term (current) use of opiate analgesic: Secondary | ICD-10-CM | POA: Insufficient documentation

## 2017-10-20 DIAGNOSIS — Z86718 Personal history of other venous thrombosis and embolism: Secondary | ICD-10-CM | POA: Insufficient documentation

## 2017-10-20 DIAGNOSIS — I10 Essential (primary) hypertension: Secondary | ICD-10-CM | POA: Insufficient documentation

## 2017-10-20 DIAGNOSIS — I742 Embolism and thrombosis of arteries of the upper extremities: Secondary | ICD-10-CM | POA: Diagnosis not present

## 2017-10-20 DIAGNOSIS — E871 Hypo-osmolality and hyponatremia: Secondary | ICD-10-CM | POA: Insufficient documentation

## 2017-10-20 DIAGNOSIS — J309 Allergic rhinitis, unspecified: Secondary | ICD-10-CM | POA: Diagnosis not present

## 2017-10-20 DIAGNOSIS — R197 Diarrhea, unspecified: Secondary | ICD-10-CM | POA: Insufficient documentation

## 2017-10-20 DIAGNOSIS — S79912A Unspecified injury of left hip, initial encounter: Secondary | ICD-10-CM | POA: Diagnosis not present

## 2017-10-20 DIAGNOSIS — Z96641 Presence of right artificial hip joint: Secondary | ICD-10-CM | POA: Diagnosis not present

## 2017-10-20 DIAGNOSIS — S79911A Unspecified injury of right hip, initial encounter: Secondary | ICD-10-CM | POA: Diagnosis not present

## 2017-10-20 DIAGNOSIS — R29810 Facial weakness: Secondary | ICD-10-CM | POA: Diagnosis not present

## 2017-10-20 DIAGNOSIS — Z7951 Long term (current) use of inhaled steroids: Secondary | ICD-10-CM | POA: Diagnosis not present

## 2017-10-20 DIAGNOSIS — M810 Age-related osteoporosis without current pathological fracture: Secondary | ICD-10-CM | POA: Diagnosis not present

## 2017-10-20 DIAGNOSIS — R42 Dizziness and giddiness: Secondary | ICD-10-CM | POA: Diagnosis not present

## 2017-10-20 DIAGNOSIS — Z7901 Long term (current) use of anticoagulants: Secondary | ICD-10-CM | POA: Insufficient documentation

## 2017-10-20 DIAGNOSIS — Z8673 Personal history of transient ischemic attack (TIA), and cerebral infarction without residual deficits: Secondary | ICD-10-CM | POA: Insufficient documentation

## 2017-10-20 DIAGNOSIS — I7300 Raynaud's syndrome without gangrene: Secondary | ICD-10-CM | POA: Insufficient documentation

## 2017-10-20 DIAGNOSIS — D509 Iron deficiency anemia, unspecified: Secondary | ICD-10-CM | POA: Diagnosis not present

## 2017-10-20 DIAGNOSIS — R55 Syncope and collapse: Secondary | ICD-10-CM | POA: Diagnosis not present

## 2017-10-20 HISTORY — DX: Unspecified convulsions: R56.9

## 2017-10-20 LAB — RAPID URINE DRUG SCREEN, HOSP PERFORMED
AMPHETAMINES: NOT DETECTED
BARBITURATES: NOT DETECTED
BENZODIAZEPINES: POSITIVE — AB
Cocaine: NOT DETECTED
Opiates: POSITIVE — AB
Tetrahydrocannabinol: NOT DETECTED

## 2017-10-20 LAB — CBC
HCT: 32.5 % — ABNORMAL LOW (ref 36.0–46.0)
HEMATOCRIT: 34.8 % — AB (ref 36.0–46.0)
HEMOGLOBIN: 12 g/dL (ref 12.0–15.0)
Hemoglobin: 11.1 g/dL — ABNORMAL LOW (ref 12.0–15.0)
MCH: 36.1 pg — ABNORMAL HIGH (ref 26.0–34.0)
MCH: 35.6 pg — ABNORMAL HIGH (ref 26.0–34.0)
MCHC: 34.5 g/dL (ref 30.0–36.0)
MCHC: 34.2 g/dL (ref 30.0–36.0)
MCV: 104.8 fL — ABNORMAL HIGH (ref 78.0–100.0)
MCV: 104.2 fL — ABNORMAL HIGH (ref 78.0–100.0)
Platelets: 274 10*3/uL (ref 150–400)
Platelets: 273 10*3/uL (ref 150–400)
RBC: 3.32 MIL/uL — AB (ref 3.87–5.11)
RBC: 3.12 MIL/uL — ABNORMAL LOW (ref 3.87–5.11)
RDW: 13.3 % (ref 11.5–15.5)
RDW: 13 % (ref 11.5–15.5)
WBC: 8.9 10*3/uL (ref 4.0–10.5)
WBC: 9.4 10*3/uL (ref 4.0–10.5)

## 2017-10-20 LAB — COMPREHENSIVE METABOLIC PANEL
ALT: 13 U/L — AB (ref 14–54)
AST: 27 U/L (ref 15–41)
Albumin: 3.6 g/dL (ref 3.5–5.0)
Alkaline Phosphatase: 44 U/L (ref 38–126)
Anion gap: 13 (ref 5–15)
BUN: 18 mg/dL (ref 6–20)
CHLORIDE: 98 mmol/L — AB (ref 101–111)
CO2: 18 mmol/L — ABNORMAL LOW (ref 22–32)
CREATININE: 0.84 mg/dL (ref 0.44–1.00)
Calcium: 9.1 mg/dL (ref 8.9–10.3)
GFR calc Af Amer: >60 mL/min (ref >60)
Glucose, Bld: 112 mg/dL — ABNORMAL HIGH (ref 65–99)
Potassium: 4.3 mmol/L (ref 3.5–5.1)
Sodium: 129 mmol/L — ABNORMAL LOW (ref 135–145)
Total Bilirubin: 0.8 mg/dL (ref 0.3–1.2)
Total Protein: 6.7 g/dL (ref 6.5–8.1)

## 2017-10-20 LAB — URINALYSIS, ROUTINE W REFLEX MICROSCOPIC
Bacteria, UA: NONE SEEN
Bilirubin Urine: NEGATIVE
Bilirubin Urine: NEGATIVE
GLUCOSE, UA: NEGATIVE mg/dL
Glucose, UA: NEGATIVE mg/dL
Ketones, ur: NEGATIVE mg/dL
Ketones, ur: NEGATIVE mg/dL
Leukocytes, UA: NEGATIVE
Leukocytes, UA: NEGATIVE
Nitrite: NEGATIVE
Nitrite: NEGATIVE
PH: 5 (ref 5.0–8.0)
Protein, ur: 30 mg/dL — AB
Protein, ur: NEGATIVE mg/dL
Specific Gravity, Urine: 1.009 (ref 1.005–1.030)
Specific Gravity, Urine: 1.009 (ref 1.005–1.030)
pH: 5 (ref 5.0–8.0)

## 2017-10-20 LAB — DIFFERENTIAL
BASOS ABS: 0 10*3/uL (ref 0.0–0.1)
BASOS PCT: 0 %
Eosinophils Absolute: 0 10*3/uL (ref 0.0–0.7)
Eosinophils Relative: 0 %
LYMPHS ABS: 1.5 10*3/uL (ref 0.7–4.0)
LYMPHS PCT: 17 %
MONOS PCT: 10 %
Monocytes Absolute: 0.9 10*3/uL (ref 0.1–1.0)
NEUTROS ABS: 6.4 10*3/uL (ref 1.7–7.7)
Neutrophils Relative %: 73 %

## 2017-10-20 LAB — OSMOLALITY, URINE: Osmolality, Ur: 314 mOsm/kg (ref 300–900)

## 2017-10-20 LAB — BASIC METABOLIC PANEL
ANION GAP: 10 (ref 5–15)
ANION GAP: 7 (ref 5–15)
Anion gap: 9 (ref 5–15)
BUN: 14 mg/dL (ref 6–20)
BUN: 10 mg/dL (ref 6–20)
BUN: 8 mg/dL (ref 6–20)
CALCIUM: 9.4 mg/dL (ref 8.9–10.3)
CHLORIDE: 100 mmol/L — AB (ref 101–111)
CO2: 22 mmol/L (ref 22–32)
CO2: 23 mmol/L (ref 22–32)
CO2: 26 mmol/L (ref 22–32)
CREATININE: 0.78 mg/dL (ref 0.44–1.00)
CREATININE: 0.75 mg/dL (ref 0.44–1.00)
Calcium: 9.2 mg/dL (ref 8.9–10.3)
Calcium: 9.1 mg/dL (ref 8.9–10.3)
Chloride: 99 mmol/L — ABNORMAL LOW (ref 101–111)
Chloride: 100 mmol/L — ABNORMAL LOW (ref 101–111)
Creatinine, Ser: 0.71 mg/dL (ref 0.44–1.00)
GFR calc Af Amer: >60 mL/min (ref >60)
GFR calc non Af Amer: >60 mL/min (ref >60)
GFR calc non Af Amer: >60 mL/min (ref >60)
GFR calc non Af Amer: >60 mL/min (ref >60)
Glucose, Bld: 105 mg/dL — ABNORMAL HIGH (ref 65–99)
Glucose, Bld: 89 mg/dL (ref 65–99)
Glucose, Bld: 91 mg/dL (ref 65–99)
POTASSIUM: 4 mmol/L (ref 3.5–5.1)
Potassium: 3.8 mmol/L (ref 3.5–5.1)
Potassium: 3.6 mmol/L (ref 3.5–5.1)
SODIUM: 131 mmol/L — AB (ref 135–145)
Sodium: 132 mmol/L — ABNORMAL LOW (ref 135–145)
Sodium: 133 mmol/L — ABNORMAL LOW (ref 135–145)

## 2017-10-20 LAB — MRSA PCR SCREENING: MRSA BY PCR: NEGATIVE

## 2017-10-20 LAB — CBG MONITORING, ED
GLUCOSE-CAPILLARY: 87 mg/dL (ref 65–99)
Glucose-Capillary: 115 mg/dL — ABNORMAL HIGH (ref 65–99)
Glucose-Capillary: 137 mg/dL — ABNORMAL HIGH (ref 65–99)
Glucose-Capillary: 99 mg/dL (ref 65–99)

## 2017-10-20 LAB — MAGNESIUM: Magnesium: 1.6 mg/dL — ABNORMAL LOW (ref 1.7–2.4)

## 2017-10-20 LAB — GLUCOSE, CAPILLARY
GLUCOSE-CAPILLARY: 90 mg/dL (ref 65–99)
Glucose-Capillary: 108 mg/dL — ABNORMAL HIGH (ref 65–99)

## 2017-10-20 LAB — SODIUM, URINE, RANDOM: Sodium, Ur: 51 mmol/L

## 2017-10-20 LAB — I-STAT CHEM 8, ED
BUN: 19 mg/dL (ref 6–20)
CHLORIDE: 96 mmol/L — AB (ref 101–111)
Calcium, Ion: 1.09 mmol/L — ABNORMAL LOW (ref 1.15–1.40)
Creatinine, Ser: 0.8 mg/dL (ref 0.44–1.00)
GLUCOSE: 111 mg/dL — AB (ref 65–99)
HCT: 38 % (ref 36.0–46.0)
Hemoglobin: 12.9 g/dL (ref 12.0–15.0)
POTASSIUM: 4.1 mmol/L (ref 3.5–5.1)
Sodium: 129 mmol/L — ABNORMAL LOW (ref 135–145)
TCO2: 21 mmol/L — ABNORMAL LOW (ref 22–32)

## 2017-10-20 LAB — PROTIME-INR
INR: 2.15
Prothrombin Time: 23.9 seconds — ABNORMAL HIGH (ref 11.4–15.2)

## 2017-10-20 LAB — APTT: APTT: 24 s (ref 24–36)

## 2017-10-20 LAB — TSH: TSH: 2.749 u[IU]/mL (ref 0.350–4.500)

## 2017-10-20 LAB — I-STAT TROPONIN, ED: TROPONIN I, POC: 0.02 ng/mL (ref 0.00–0.08)

## 2017-10-20 LAB — ETHANOL: Alcohol, Ethyl (B): <10 mg/dL (ref <10)

## 2017-10-20 MED ORDER — LORAZEPAM 2 MG/ML IJ SOLN
INTRAMUSCULAR | Status: AC
  Administered 2017-10-20: 2 mg via INTRAVENOUS
  Filled 2017-10-20: qty 1

## 2017-10-20 MED ORDER — ACETAMINOPHEN 325 MG PO TABS
650.0000 mg | ORAL_TABLET | ORAL | Status: DC | PRN
  Administered 2017-10-20 – 2017-10-21 (×2): 650 mg via ORAL
  Filled 2017-10-20 (×2): qty 2

## 2017-10-20 MED ORDER — ACETAMINOPHEN 650 MG RE SUPP: 650.0000 mg | RECTAL | Status: DC | PRN

## 2017-10-20 MED ORDER — LEVETIRACETAM IN NACL 1000 MG/100ML IV SOLN
1000.0000 mg | Freq: Once | INTRAVENOUS | Status: AC
  Administered 2017-10-20: 1000 mg via INTRAVENOUS
  Filled 2017-10-20: qty 100

## 2017-10-20 MED ORDER — SODIUM CHLORIDE 0.9 % IV SOLN
INTRAVENOUS | Status: AC
  Administered 2017-10-21: 07:00:00 via INTRAVENOUS

## 2017-10-20 MED ORDER — LORAZEPAM 2 MG/ML IJ SOLN: 1.0000 mg | INTRAMUSCULAR | Status: DC | PRN

## 2017-10-20 MED ORDER — MAGNESIUM SULFATE 2 GM/50ML IV SOLN
2.0000 g | Freq: Once | INTRAVENOUS | Status: AC
Start: 2017-10-20 — End: 2017-10-20
  Administered 2017-10-20: 2 g via INTRAVENOUS
  Filled 2017-10-20: qty 50

## 2017-10-20 MED ORDER — HYDRALAZINE HCL 20 MG/ML IJ SOLN
10.0000 mg | Freq: Four times a day (QID) | INTRAMUSCULAR | Status: DC | PRN
  Administered 2017-10-21: 10 mg via INTRAVENOUS
  Filled 2017-10-20: qty 1

## 2017-10-20 MED ORDER — GADOBENATE DIMEGLUMINE 529 MG/ML IV SOLN
12.0000 mL | Freq: Once | INTRAVENOUS | Status: AC | PRN
  Administered 2017-10-20: 12 mL via INTRAVENOUS

## 2017-10-20 MED ORDER — LORAZEPAM 2 MG/ML IJ SOLN
2.0000 mg | Freq: Once | INTRAMUSCULAR | Status: AC
  Administered 2017-10-20: 2 mg via INTRAVENOUS

## 2017-10-20 MED ORDER — WARFARIN SODIUM 7.5 MG PO TABS
7.5000 mg | ORAL_TABLET | Freq: Once | ORAL | Status: AC
  Administered 2017-10-20: 7.5 mg via ORAL
  Filled 2017-10-20: qty 1

## 2017-10-20 MED ORDER — HYDRALAZINE HCL 20 MG/ML IJ SOLN: 10.0000 mg | INTRAMUSCULAR | Status: DC | PRN

## 2017-10-20 MED ORDER — LABETALOL HCL 5 MG/ML IV SOLN
10.0000 mg | Freq: Once | INTRAVENOUS | Status: AC
  Administered 2017-10-20: 10 mg via INTRAVENOUS
  Filled 2017-10-20: qty 4

## 2017-10-20 MED ORDER — SODIUM CHLORIDE 0.9 % IV SOLN
INTRAVENOUS | Status: AC
  Administered 2017-10-20 (×2): via INTRAVENOUS

## 2017-10-20 MED ORDER — LOSARTAN POTASSIUM 50 MG PO TABS
100.0000 mg | ORAL_TABLET | Freq: Every day | ORAL | Status: DC
  Administered 2017-10-20 – 2017-10-22 (×3): 100 mg via ORAL
  Filled 2017-10-20 (×3): qty 2

## 2017-10-20 MED ORDER — LEVETIRACETAM IN NACL 500 MG/100ML IV SOLN
500.0000 mg | Freq: Two times a day (BID) | INTRAVENOUS | Status: DC
  Administered 2017-10-20 (×2): 500 mg via INTRAVENOUS
  Filled 2017-10-20 (×4): qty 100

## 2017-10-20 MED ORDER — ACETAMINOPHEN 160 MG/5ML PO SOLN: 650.0000 mg | ORAL | Status: DC | PRN

## 2017-10-20 MED ORDER — WARFARIN - PHARMACIST DOSING INPATIENT
Freq: Every day | Status: DC
  Administered 2017-10-20: 19:00:00

## 2017-10-20 NOTE — Procedures (Signed)
ELECTROENCEPHALOGRAM REPORT  Date of Study: 10/20/2017  Patient's Name: Tracy Peters MRN: [id number] Date of Birth: July 16, 1933  Referring Provider: Samara Snide, MD  Clinical History: 84 year old female with witnessed seizure and confusion.  Medications: Keppra Hydralazine  Technical Summary: A multichannel digital EEG recording measured by the international 10-20 system with electrodes applied with paste and impedances below 5000 ohms performed in our laboratory with EKG monitoring in an awake and asleep patient.  Hyperventilation was not performed.  Photic stimulation was performed.  The digital EEG was referentially recorded, reformatted, and digitally filtered in a variety of bipolar and referential montages for optimal display.    Description: The patient is mostly drowsy and asleep during the recording.  During maximal wakefulness, there is a symmetric, medium voltage 9 Hz posterior dominant rhythm that attenuates with eye opening.  The record is symmetric.  During drowsiness and sleep, there is an increase in theta slowing of the background.  Vertex waves and symmetric sleep spindles were seen.  Photic stimulation did not elicit any abnormalities.  There were no epileptiform discharges or electrographic seizures seen.    EKG lead was unremarkable.  Impression: This predominantly drowsy and asleep EEG is normal.    Clinical Correlation: A normal EEG does not exclude a clinical diagnosis of epilepsy.  If further clinical questions remain, prolonged EEG may be helpful.  Clinical correlation is advised.   Metta Clines, DO

## 2017-10-20 NOTE — Progress Notes (Signed)
Tracy Peters is a 84 y.o. female patient admitted from ED awake, alert - oriented  X 4 - no acute distress noted.  VSS - Blood pressure 124/62, pulse 76, temperature 98.2 F (36.8 C), temperature source Oral, resp. rate 18, height 5\' 4"  (1.626 m), weight 55.3 kg (122 lb), SpO2 99 %.    IV in place, occlusive dsg intact without redness.  Orientation to room, and floor completed with information packet given to patient/family.  Patient declined safety video at this time.  Admission INP armband ID verified with patient/family, and in place.   SR up x 2, fall assessment complete, with patient and family able to verbalize understanding of risk associated with falls, and verbalized understanding to call nsg before up out of bed.  Call light within reach, patient able to voice, and demonstrate understanding.  Skin, clean-dry- intact without evidence of bruising, or skin tears.   No evidence of skin break down noted on exam.     Will cont to eval and treat per MD orders.  Celine Ahr, RN 10/20/2017 2:25 PM

## 2017-10-20 NOTE — Evaluation (Signed)
Clinical/Bedside Swallow Evaluation Patient Details  Name: RIVEN FORST MRN: [id number] Date of Birth: April 07, 1934  Today's Date: 10/20/2017 Time: SLP Start Time (ACUTE ONLY): 1400 SLP Stop Time (ACUTE ONLY): 1414 SLP Time Calculation (min) (ACUTE ONLY): 14 min  Past Medical History:  Past Medical History:  Diagnosis Date  . Adenomatous colon polyp   . Allergic rhinitis    uses FLonase nightly  . Anemia   . Arthritis    joint pain  . Blood transfusion   . Bronchitis    hx of-7-83yrs ago  . Carpal tunnel syndrome    numbness and tingling   . Cataracts, bilateral   . Chronic anticoagulation 03/21/2013  . Congenital absence of one kidney   . Diarrhea    takes Immodium daily as needed or Lomotil   . Diverticulosis   . GERD (gastroesophageal reflux disease)    mild  . Herpes    takes Valtrex daily  . History of colon polyps   . History of DVT (deep vein thrombosis) 1998   right arm  . History of staph infection   . Hyperlipidemia    Medical MD doesn't think its absolutely necessary  . Hypertension    takes Losartan daily  . IBS (irritable bowel syndrome)    takes Librarian, academic daily  . Insomnia    takes Ambien nightly as needed  . LBP (low back pain)   . Long term (current) use of anticoagulants    Dr. Cyndie Chime  . Nocturia   . Osteoporosis   . Raynaud's disease   . Sarcoma of left lower extremity (HCC)    Dr. Elie Confer  . 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 Urinary leakage   . UTI (urinary tract infection)    Past Surgical History:  Past Surgical History:  Procedure Laterality Date  . ABDOMINAL HYSTERECTOMY    . ANTERIOR CERVICAL DECOMP/DISCECTOMY FUSION N/A 01/18/2014   Procedure: ANTERIOR CERVICAL DECOMPRESSION/DISCECTOMY FUSION 3 LEVELS  Cervical  four/five, five/six, six/seven anterior cervical decompression with fusion interbody prosthesis with plating and bonegraft;  Surgeon: Tressie Stalker, MD;  Location:  [facility];  Service: Neurosurgery;  Laterality: N/A;  t  . APPENDECTOMY    . BACK SURGERY     2  . BREAST SURGERY     cystectomy-benign  . CARPAL TUNNEL RELEASE Right 06/12/2014   Procedure: CARPAL TUNNEL RELEASE;  Surgeon: Tressie Stalker, MD;  Location: [facility];  Service: Neurosurgery;  Laterality: Right;  Right Carpal Tunnel Release  . CATARACT EXTRACTION Bilateral 02/15/2015  . CHOLECYSTECTOMY    . COLONOSCOPY    . HIP CLOSED REDUCTION Right 08/23/2017   Procedure: CLOSED MANIPULATION HIP;  Surgeon: Yolonda Kida, MD;  Location: [facility];  Service: Orthopedics;  Laterality: Right;  . I&D of abdomen  1988   couple of wks after gallbladder removed  . KNEE SURGERY     left x 3  . LUMBAR LAMINECTOMY     X 2  . mortons neuromas removed    . OPEN SURGICAL REPAIR OF GLUTEAL TENDON Right 06/15/2017   Procedure: OPEN SURGICAL REPAIR OF GLUTEALmedius TENDON;  Surgeon: Durene Romans, MD;  Location: [facility];  Service: Orthopedics;  Laterality: Right;  . SHOULDER SURGERY     Right  . TOTAL HIP ARTHROPLASTY Right 06/15/2017   Procedure: Right total hip arthroplasty, bursectomy, repair of gluteal tendon, posterior approach;  Surgeon: Durene Romans, MD;  Location: [facility];  Service:  Orthopedics;  Laterality: Right;  90 mins for all procedures together   HPI:  JANELY RAPALO is an 84 y.o. female 3-year-old female with history of hypertension, multiple DVTs on chronic anticoagulation with Coumadin, hyperlipidemia, anemia who presents to the emergency room after having seizure-like activity witnessed by husband. CT head showed no bleed. She subsequently had another seizure witnessed by internist. MRI shows No acute or reversible finding.    Assessment / Plan / Recommendation Clinical Impression  Pt swallow function subjectively appears normal EXCEPT that pt reports severe sharp pain in the area just between her jaw and ear when she swallows, particularly cold liquids. Pt report raises  concern for trigeminal or glosspharyngeal neuralgia. Reported this to MD as pt had not reported it earlier to MD or family. She states that it had been ongoing for at least 6 weeks prior to admission, but has increased in severity. Will initaite a regular diet and thin liquids. No further SLP needs at this time, will sign off.  SLP Visit Diagnosis: Dysphagia, unspecified (R13.10)    Aspiration Risk  Mild aspiration risk    Diet Recommendation Regular;Thin liquid   Liquid Administration via: Cup;Straw Medication Administration: Whole meds with liquid Postural Changes: Seated upright at 90 degrees    Other  Recommendations     Follow up Recommendations        Frequency and Duration            Prognosis        Swallow Study   General HPI: LIYA LIONS is an 84 y.o. female 57-year-old female with history of hypertension, multiple DVTs on chronic anticoagulation with Coumadin, hyperlipidemia, anemia who presents to the emergency room after having seizure-like activity witnessed by husband. CT head showed no bleed. She subsequently had another seizure witnessed by internist. MRI shows No acute or reversible finding.  Type of Study: Bedside Swallow Evaluation Diet Prior to this Study: NPO History of Recent Intubation: No Behavior/Cognition: Cooperative;Pleasant mood;Lethargic/Drowsy Patient Positioning: Upright in chair Baseline Vocal Quality: Normal Volitional Cough: Strong Volitional Swallow: Able to elicit    Oral/Motor/Sensory Function Overall Oral Motor/Sensory Function: Within functional limits   Ice Chips     Thin Liquid Thin Liquid: Within functional limits    Nectar Thick Nectar Thick Liquid: Not tested   Honey Thick Honey Thick Liquid: Not tested   Puree Puree: Within functional limits Presentation: Spoon   Solid   GO   Solid: Within functional limits Presentation: Self Fed       Harlon Ditty, MA CCC-SLP [phone]  Claudine Mouton 10/20/2017,2:32  PM

## 2017-10-20 NOTE — ED Notes (Signed)
Neurologist Aroor, MD at bedside.

## 2017-10-20 NOTE — ED Notes (Addendum)
Pt's CBG result was 87. Informed Rod Holler - RN.

## 2017-10-20 NOTE — Progress Notes (Signed)
PT Cancellation Note  Patient Details Name: Tracy Peters MRN: [id number] DOB: 1934-02-15   Cancelled Treatment:    Reason Eval/Treat Not Completed: Patient at procedure or test/unavailable, MRI will follow   Duncan Dull 10/20/2017, 12:37 PM

## 2017-10-20 NOTE — ED Notes (Signed)
Patient transported to X-ray 

## 2017-10-20 NOTE — ED Notes (Addendum)
Patient transported to CT 

## 2017-10-20 NOTE — ED Notes (Signed)
Pt not alert at this time. Post-ictal.

## 2017-10-20 NOTE — ED Notes (Signed)
Attempted report 

## 2017-10-20 NOTE — Progress Notes (Addendum)
PROGRESS NOTE  Tracy Peters UEK:[id number] DOB: 11-Mar-1934 DOA: 10/20/2017 PCP: Cassandria Anger, MD  HPI/Recap of past 24 hours:  Patient was seen this am alert and awake,  She has swallow eval, she told speech therapist that she has been having right sided jaw pain with swallowing for the last 6weeks  I went back to check on her this pm around 4pm , she is snoring, very hard to be aroused, she did follow commands briefly , denies temporal tenderness, denies recent vision changes or frontal headache.   Addendum: she woke up at around 7;30 , aaox3  Daughter and husband at bedside  Assessment/Plan: Principal Problem:   Seizures ([place]) Active Problems:   Essential hypertension   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[place])   Primary hypercoagulable state ([place]) [D68.59]   Seizure ([place])  Partial seizures: -Family ambulated pt to the bathroom when she had syncopal episode was lowered to the ground by her husband. Husband saw her right hand posture and shake. Pt was in postical phase and unable to answer any question for ems. As ems arrived to hospital pt was axox2 -Patient almost back to baseline emergency room and then noted by EDP to have another seizure with tonic-clonic jerking. She received ativanx1 in the ED which terminated siezure -neurology consulted in the ED, patient is started on keppra -eeg no acute findings/MRI brain no acute findings -family reports patient is started on new sleeping aid medicine , she took one dose on Friday, they wonder if she is having a reaction  To that medication ( they are not sure if it is trazodone or  Restoril)  Right sided jaw pain with chewing, -she reports symptom started 5-6 weeks ago to the speech therapist, she did not reports this symptom to provides who evaluated her early or to me this am, she did not tell her family about this either -Broad differential including TMJ, neuralgia, vasculitis -she is currently too drowsy to fully  participate exam , but she denies vision changes, frontal headache. - I have ordered esr/crp -case discussed with neurology Dr Earnestine Leys over the phone who agreed to order MRA head/neck to rule out vasculitis, orders in , but need to wait until tomorrow due to already had contrast earlier today  Hyponatremia: Clinically dehydrated, continue hydration  Hypomagnesemia: Replace mag  HTN: continue home meds, with prn hydralazine   multiple DVTs  And right radial artery thrombosis   chronic anticoagulation with Coumadin, she is followed by Dr Beryle Beams Coumadin dosing per pharmacy, inr 2.15 on presentation   Code Status: full, confirmed  Family Communication: patient and family  Disposition Plan: not ready to discharge   Consultants:  neurology   Procedures:  eeg  Antibiotics:  none   Objective: BP 124/62   Pulse 76   Temp 98.2 F (36.8 C) (Oral)   Resp 18   Ht '5\' 4"'  (1.626 m)   Wt 55.3 kg (121 lb 14.6 oz)   SpO2 99%   BMI 20.93 kg/m   Intake/Output Summary (Last 24 hours) at 10/20/2017 1607 Last data filed at 10/20/2017 0834 Gross per 24 hour  Intake 200 ml  Output -  Net 200 ml   Filed Weights   10/20/17 0032 10/20/17 1426  Weight: 55.3 kg (122 lb) 55.3 kg (121 lb 14.6 oz)    Exam: Patient is examined daily including today on 10/20/2017, exams remain the same as of yesterday except that has changed    General:  NAD  Cardiovascular:  RRR  Respiratory: CTABL  Abdomen: Soft/ND/NT, positive BS  Musculoskeletal: No Edema  Neuro: alert, oriented   Addendum: Pm exam limited due to patient is sleeping ( snoring), hard to be aroused. Close monitor , neuro check, vital signs are  stable.   Data Reviewed: Basic Metabolic Panel: Recent Labs  Lab 10/20/17 0105 10/20/17 0113 10/20/17 0512 10/20/17 1109 10/20/17 1457  NA 129* 129* 131* 132* 133*  K 4.3 4.1 4.0 3.8 3.6  CL 98* 96* 99* 100* 100*  CO2 18*  --  '22 23 26  ' GLUCOSE 112* 111* 105* 89 91    BUN '18 19 14 10 8  ' CREATININE 0.84 0.80 0.78 0.71 0.75  CALCIUM 9.1  --  9.2 9.1 9.4  MG  --   --   --  1.6*  --    Liver Function Tests: Recent Labs  Lab 10/20/17 0105  AST 27  ALT 13*  ALKPHOS 44  BILITOT 0.8  PROT 6.7  ALBUMIN 3.6   No results for input(s): LIPASE, AMYLASE in the last 168 hours. No results for input(s): AMMONIA in the last 168 hours. CBC: Recent Labs  Lab 10/20/17 0105 10/20/17 0113 10/20/17 0512  WBC 8.9  --  9.4  NEUTROABS 6.4  --   --   HGB 12.0 12.9 11.1*  HCT 34.8* 38.0 32.5*  MCV 104.8*  --  104.2*  PLT 274  --  273   Cardiac Enzymes:   Recent Labs  Lab 10/20/17 0512  TROPONINI 0.03*   BNP (last 3 results) No results for input(s): BNP in the last 8760 hours.  ProBNP (last 3 results) No results for input(s): PROBNP in the last 8760 hours.  CBG: Recent Labs  Lab 10/20/17 0024 10/20/17 0245 10/20/17 0826 10/20/17 1142  GLUCAP 115* 137* 99 87    No results found for this or any previous visit (from the past 240 hour(s)).   Studies: Ct Head Wo Contrast  Result Date: 10/20/2017 CLINICAL DATA:  84 year old female with dizziness and facial droop. EXAM: CT HEAD WITHOUT CONTRAST TECHNIQUE: Contiguous axial images were obtained from the base of the skull through the vertex without intravenous contrast. COMPARISON:  Head CT dated 09/30/2016 FINDINGS: Brain: There is mild age-related atrophy and chronic microvascular ischemic changes. There is no acute intracranial hemorrhage. No mass effect or midline shift. No extra-axial fluid collection. Vascular: No hyperdense vessel or unexpected calcification. Skull: Normal. Negative for fracture or focal lesion. Sinuses/Orbits: No acute finding. Other: None IMPRESSION: 1. No acute intracranial pathology. 2. Age-related atrophy and chronic microvascular ischemic changes. Electronically Signed   By: Anner Crete M.D.   On: 10/20/2017 01:49   Mr Jeri Cos RC Contrast  Result Date: 10/20/2017 CLINICAL  DATA:  Brief loss of consciousness. Confusion. Speech disturbance. EXAM: MRI HEAD WITHOUT AND WITH CONTRAST TECHNIQUE: Multiplanar, multiecho pulse sequences of the brain and surrounding structures were obtained without and with intravenous contrast. CONTRAST:  14m MULTIHANCE GADOBENATE DIMEGLUMINE 529 MG/ML IV SOLN COMPARISON:  CT 10/20/2017.  MRI 10/12/2013. FINDINGS: Brain: Diffusion imaging does not show any acute or subacute infarction. Brainstem and cerebellum are normal. Cerebral hemispheres show age related atrophy with moderate chronic small-vessel ischemic changes throughout the deep and subcortical white matter. No cortical or large vessel territory infarction. No mass lesion, hemorrhage, hydrocephalus or extra-axial collection. After contrast administration, no abnormal enhancement occurs. Vascular: Major vessels at the base of the brain show flow. Skull and upper cervical spine: Negative Sinuses/Orbits: Clear/normal Other: None IMPRESSION: No  acute or reversible finding. Age related atrophy. Moderate chronic small-vessel ischemic changes of the cerebral hemispheric white matter. Electronically Signed   By: [place] Chimes M.D.   On: 10/20/2017 13:16   Dg Chest Port 1 View  Result Date: 10/20/2017 CLINICAL DATA:  84 y/o  F; seizure. EXAM: PORTABLE CHEST 1 VIEW COMPARISON:  04/17/2016 chest radiograph FINDINGS: Stable normal cardiac silhouette given projection and technique. Aortic atherosclerosis with calcification. Clear lungs. No pleural effusion or pneumothorax. No acute osseous abnormality is evident. Partially visualized anterior cervical fusion hardware. IMPRESSION: No active disease. Electronically Signed   By: Kristine Garbe M.D.   On: 10/20/2017 06:21   Dg Hips Bilat W Or Wo Pelvis 5 Views  Result Date: 10/20/2017 CLINICAL DATA:  84 year old female with fall. EXAM: DG HIP (WITH OR WITHOUT PELVIS) 5+V BILAT COMPARISON:  None. FINDINGS: There is no acute fracture or dislocation. The  bones are osteopenic. There is a total right hip arthroplasty. The arthroplasty components appear intact and in anatomic alignment. The soft tissues are grossly unremarkable. IMPRESSION: No acute fracture or dislocation. Electronically Signed   By: Anner Crete M.D.   On: 10/20/2017 03:00    Scheduled Meds:  Continuous Infusions: . sodium chloride 50 mL/hr at 10/20/17 1447  . levETIRAcetam Stopped (10/20/17 0834)  . magnesium sulfate 1 - 4 g bolus IVPB 2 g (10/20/17 1559)     Time spent: 35 mins I have personally reviewed and interpreted on  10/20/2017 daily labs, tele strips, imagings as discussed above under date review session and assessment and plans.  I reviewed all nursing notes, pharmacy notes, consultant notes,  vitals, pertinent old records  I have discussed plan of care as described above with RN , patient and family on 10/20/2017   Florencia Reasons MD, PhD  [facility] Pager [phone]. If 7PM-7AM, please contact night-coverage at [email or web address], password [facility] 10/20/2017, 4:07 PM  LOS: 0 days

## 2017-10-20 NOTE — ED Notes (Signed)
Patient is now back in bed on the monitor call bell in reach and family at bedside

## 2017-10-20 NOTE — Progress Notes (Signed)
ANTICOAGULATION CONSULT NOTE - Initial Consult  Pharmacy Consult for Heparin when INR <2 (warfarin on hold) Indication: Hx of DVT, Hx of arterial thrombus  Allergies  Allergen Reactions  . Bee Venom Anaphylaxis and Other (See Comments)    Yellow jackets, wasps as well  . Ivp Dye [Iodinated Diagnostic Agents] Anaphylaxis  . Shellfish Allergy Anaphylaxis  . Amlodipine Besylate Swelling  . Aspirin Other (See Comments)    REACTION: unspecified  . Atorvastatin Diarrhea  . Cholestyramine Other (See Comments)    REACTION: mouth irritation  . Codeine Phosphate Nausea And Vomiting  . Demerol [Meperidine] Other (See Comments)    Severe Hallucination!!!!  . Diphenhydramine Hcl Other (See Comments)    hyperactive  . Doxycycline Nausea Only  . Gentamicin Sulfate Other (See Comments)    REACTION: unspecified  . Meclizine Hcl Other (See Comments)    REACTION: more dizziness on it  . Meperidine Hcl Other (See Comments)    Unknown  . Metronidazole Other (See Comments)    REACTION: bad taste  . Moxifloxacin Other (See Comments)    REACTION: insomnia Able to use Cipro  . Sulfamethoxazole Other (See Comments)    Kidney problems  . Penicillins Swelling, Rash and Other (See Comments)    Has patient had a PCN reaction causing immediate rash, facial/tongue/throat swelling, SOB or lightheadedness with hypotension: Yes Has patient had a PCN reaction causing severe rash involving mucus membranes or skin necrosis: No Has patient had a PCN reaction that required hospitalization: No Has patient had a PCN reaction occurring within the last 10 years: No If all of the above answers are "NO", then may proceed with Cephalosporin use.     Patient Measurements: Height: 5\' 4"  (162.6 cm) Weight: 122 lb (55.3 kg) IBW/kg (Calculated) : 54.7  Vital Signs: Temp: 98.2 F (36.8 C) (05/07 0015) Temp Source: Oral (05/07 0015) BP: 111/63 (05/07 0330) Pulse Rate: 96 (05/07 0330)  Labs: Recent Labs   10/20/17 0105 10/20/17 0113  HGB 12.0 12.9  HCT 34.8* 38.0  PLT 274  --   APTT 24  --   LABPROT 23.9*  --   INR 2.15  --   CREATININE 0.84 0.80    Estimated Creatinine Clearance: 46.8 mL/min (by C-G formula based on SCr of 0.8 mg/dL).   Medical History: Past Medical History:  Diagnosis Date  . Adenomatous colon polyp   . Allergic rhinitis    uses FLonase nightly  . Anemia   . Arthritis    joint pain  . Blood transfusion   . Bronchitis    hx of-7-30yrs ago  . Carpal tunnel syndrome    numbness and tingling   . Cataracts, bilateral   . Chronic anticoagulation 03/21/2013  . Congenital absence of one kidney   . Diarrhea    takes Immodium daily as needed or Lomotil   . Diverticulosis   . GERD (gastroesophageal reflux disease)    mild  . Herpes    takes Valtrex daily  . History of colon polyps   . History of DVT (deep vein thrombosis) 1998   right arm  . History of staph infection   . Hyperlipidemia    Medical MD doesn't think its absolutely necessary  . Hypertension    takes Losartan daily  . IBS (irritable bowel syndrome)    takes Electronics engineer daily  . Insomnia    takes Ambien nightly as needed  . LBP (low back pain)   . Long term (current) use of anticoagulants  Dr. Beryle Beams  . Nocturia   . Osteoporosis   . Raynaud's disease   . Sarcoma of left lower extremity ([place])    Dr. Winfield Cunas  . 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[place]) 09/16/2011   Right digital artery 1998 idiopathic   . Urinary leakage   . UTI (urinary tract infection)     Assessment: 84 y/o F on warfarin PTA for the above indications, presents to the ED with dizziness/seizure, holding warfarin and starting heparin when INR is <2, INR is 2.15             Goal of Therapy:  Heparin level 0.3-0.7 units/ml Monitor platelets by anticoagulation protocol: Yes   Plan:  Check INR at 1800 Start heparin when INR is <2  Narda Bonds 10/20/2017,4:26 AM

## 2017-10-20 NOTE — Progress Notes (Signed)
ANTICOAGULATION CONSULT NOTE - Initial Consult  Pharmacy Consult for Warfarin Indication: Hx of DVT, Hx of arterial thrombus  Patient Measurements: Height: 5\' 4"  (162.6 cm) Weight: 121 lb 14.6 oz (55.3 kg) IBW/kg (Calculated) : 54.7  Vital Signs: BP: 124/62 (05/07 1000) Pulse Rate: 76 (05/07 1145)  Labs: Recent Labs    10/20/17 0105 10/20/17 0113 10/20/17 0512 10/20/17 1109 10/20/17 1457  HGB 12.0 12.9 11.1*  --   --   HCT 34.8* 38.0 32.5*  --   --   PLT 274  --  273  --   --   APTT 24  --   --   --   --   LABPROT 23.9*  --   --   --   --   INR 2.15  --   --   --   --   CREATININE 0.84 0.80 0.78 0.71 0.75  TROPONINI  --   --  0.03*  --   --     Estimated Creatinine Clearance: 46.8 mL/min (by C-G formula based on SCr of 0.75 mg/dL).   Assessment: 84 y/o F on warfarin PTA for R-radial artery thrombosis who presented to the ED on 4/7 with dizziness/seizure. The initial plan was to hold warfarin and bridge with Heparin when INR<2 however now plans are to resume warfarin this evening.   PTA dose: 7.5 mg/day EXCEPT for 10 mg two days a week ([facility] lists Mon/Fri, PTA med list states Tues/Fri)  INR today is therapeutic at 2.15 - will give 7.5 mg dose tonight and monitor trends  Goal of Therapy:  Heparin level 0.3-0.7 units/ml Monitor platelets by anticoagulation protocol: Yes   Plan:  1. Warfarin 7.5 mg x 1 dose at 1800 today 2. Will continue to monitor for any signs/symptoms of bleeding and will follow up with PT/INR in the a.m.  Thank you for allowing pharmacy to be a part of this patient's care.  Alycia Rossetti, PharmD, BCPS Clinical Pharmacist Pager: [phone] Clinical phone for 10/20/2017: [phone] 10/20/2017 4:43 PM

## 2017-10-20 NOTE — ED Notes (Signed)
Pt transported to EEG 

## 2017-10-20 NOTE — ED Triage Notes (Signed)
From home by ems. Pt around 10pm LKW pt woke up not making any sense. Family ambulated pt to the bathroom when she had syncopal episode was lowered to the ground by her husband. Husband saw her right hand posture and shake. Pt was in postical phase and unable to answer any question for ems. As ems arrived to hospital pt was axox2

## 2017-10-20 NOTE — ED Notes (Signed)
Pt sitting in chair.

## 2017-10-20 NOTE — ED Notes (Signed)
Checked patient cbg it was 52 notified RN Rod Holler of blood sugar patient is resting with call bell in reach and family at bedside

## 2017-10-20 NOTE — Consult Note (Signed)
Requesting Physician: Dr. Randal Buba    Chief Complaint: Grant Ruts  History obtained from: Patient and Chart     HPI:                                                                                                                                       Tracy Peters is an 84 y.o. female 57-year-old female with history of hypertension, multiple DVTs on chronic anticoagulation with Coumadin, hyperlipidemia, anemia who presents to the emergency room after having seizure-like activity witnessed by husband.  The husband states that she was getting off the bed and going to the bathroom when he noticed her right arm became tense and patient started to have whole body jerking and then passed out.   On arrival to [facility] ER, patient's blood pressure was very high 948 systolic. Patient had no focal deficits and repeating sentences. Intial concern for stroke however not stroke alerted as past 4.5 hr window and LVO scale negative.  CT head showed no bleed. She subsequently had another seizure witnessed by internist.     Past Medical History:  Diagnosis Date  . Adenomatous colon polyp   . Allergic rhinitis    uses FLonase nightly  . Anemia   . Arthritis    joint pain  . Blood transfusion   . Bronchitis    hx of-7-49yrs ago  . Carpal tunnel syndrome    numbness and tingling   . Cataracts, bilateral   . Chronic anticoagulation 03/21/2013  . Congenital absence of one kidney   . Diarrhea    takes Immodium daily as needed or Lomotil   . Diverticulosis   . GERD (gastroesophageal reflux disease)    mild  . Herpes    takes Valtrex daily  . History of colon polyps   . History of DVT (deep vein thrombosis) 1998   right arm  . History of staph infection   . Hyperlipidemia    Medical MD doesn't think its absolutely necessary  . Hypertension    takes Losartan daily  . IBS (irritable bowel syndrome)    takes Electronics engineer daily  . Insomnia    takes Ambien nightly as needed  . LBP (low  back pain)   . Long term (current) use of anticoagulants    Dr. Beryle Beams  . Nocturia   . Osteoporosis   . Raynaud's disease   . 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 Winfield Cunas  . 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 Urinary leakage   . UTI (urinary tract infection)     Past Surgical History:  Procedure Laterality Date  . ABDOMINAL HYSTERECTOMY    . ANTERIOR CERVICAL DECOMP/DISCECTOMY FUSION N/A 01/18/2014   Procedure: ANTERIOR CERVICAL DECOMPRESSION/DISCECTOMY FUSION 3 LEVELS  Cervical  four/five, five/six, six/seven anterior cervical decompression with fusion interbody prosthesis with plating and bonegraft;  Surgeon: Newman Pies, MD;  Location: [facility] NEURO ORS;  Service: Neurosurgery;  Laterality: N/A;  t  . APPENDECTOMY    . BACK SURGERY     2  . BREAST SURGERY     cystectomy-benign  . CARPAL TUNNEL RELEASE Right 06/12/2014   Procedure: CARPAL TUNNEL RELEASE;  Surgeon: Newman Pies, MD;  Location: [facility] NEURO ORS;  Service: Neurosurgery;  Laterality: Right;  Right Carpal Tunnel Release  . CATARACT EXTRACTION Bilateral 02/15/2015  . CHOLECYSTECTOMY    . COLONOSCOPY    . HIP CLOSED REDUCTION Right 08/23/2017   Procedure: CLOSED MANIPULATION HIP;  Surgeon: Nicholes Stairs, MD;  Location: [facility] ORS;  Service: Orthopedics;  Laterality: Right;  . I&D of abdomen  1988   couple of wks after gallbladder removed  . KNEE SURGERY     left x 3  . LUMBAR LAMINECTOMY     X 2  . mortons neuromas removed    . OPEN SURGICAL REPAIR OF GLUTEAL TENDON Right 06/15/2017   Procedure: OPEN SURGICAL REPAIR OF GLUTEALmedius TENDON;  Surgeon: Paralee Cancel, MD;  Location: [facility] ORS;  Service: Orthopedics;  Laterality: Right;  . SHOULDER SURGERY     Right  . TOTAL HIP ARTHROPLASTY Right 06/15/2017   Procedure: Right total hip arthroplasty, bursectomy, repair of gluteal tendon, posterior approach;  Surgeon: Paralee Cancel, MD;  Location:  [facility] ORS;  Service: Orthopedics;  Laterality: Right;  90 mins for all procedures together    Family History  Problem Relation Age of Onset  . Breast cancer Sister   . Kidney disease Sister 63       nephritis  . Parkinsonism Mother   . Heart disease Brother   . Osteoarthritis Other   . Ulcerative colitis Sister   . Colon cancer Neg Hx    Social History:  reports that she has never smoked. She has never used smokeless tobacco. She reports that she does not drink alcohol or use drugs.  Allergies:  Allergies  Allergen Reactions  . Bee Venom Anaphylaxis and Other (See Comments)    Yellow jackets, wasps as well  . Ivp Dye [Iodinated Diagnostic Agents] Anaphylaxis  . Shellfish Allergy Anaphylaxis  . Amlodipine Besylate Swelling  . Aspirin Other (See Comments)    REACTION: unspecified  . Atorvastatin Diarrhea  . Cholestyramine Other (See Comments)    REACTION: mouth irritation  . Codeine Phosphate Nausea And Vomiting  . Demerol [Meperidine] Other (See Comments)    Severe Hallucination!!!!  . Diphenhydramine Hcl Other (See Comments)    hyperactive  . Doxycycline Nausea Only  . Gentamicin Sulfate Other (See Comments)    REACTION: unspecified  . Meclizine Hcl Other (See Comments)    REACTION: more dizziness on it  . Meperidine Hcl Other (See Comments)    Unknown  . Metronidazole Other (See Comments)    REACTION: bad taste  . Moxifloxacin Other (See Comments)    REACTION: insomnia Able to use Cipro  . Sulfamethoxazole Other (See Comments)    Kidney problems  . Penicillins Swelling, Rash and Other (See Comments)    Has patient had a PCN reaction causing immediate rash, facial/tongue/throat swelling, SOB or lightheadedness with hypotension: Yes Has patient had a PCN reaction causing severe rash involving mucus membranes or skin necrosis: No Has patient had a PCN reaction that required hospitalization: No Has patient had a PCN reaction occurring within the last 10 years: No If all  of the above answers are "NO", then may proceed with Cephalosporin use.  Medications:                                                                                                                        I reviewed home medications   ROS:                                                                                                                                     14 systems reviewed and negative except above    Examination:                                                                                                      General: Appears well-developed and well-nourished.  Psych: Affect appropriate to situation Eyes: No scleral injection HENT: No OP obstrucion Head: Normocephalic.  Cardiovascular: Normal rate and regular rhythm.  Respiratory: Effort normal and breath sounds normal to anterior ascultation GI: Soft.  No distension. There is no tenderness.  Skin: WDI   Neurological Examination Mental Status: Alert, oriented, thought content appropriate.  Speech fluent without evidence of aphasia. Able to follow 3 step commands without difficulty. Cranial Nerves: II: Visual fields grossly normal,  III,IV, VI: ptosis not present, extra-ocular motions intact bilaterally, pupils equal, round, reactive to light and accommodation V,VII: smile symmetric, facial light touch sensation normal bilaterally VIII: hearing normal bilaterally IX,X: uvula rises symmetrically XI: bilateral shoulder shrug XII: midline tongue extension Motor: Right : Upper extremity   5/5    Left:     Upper extremity   5/5  Lower extremity   5/5     Lower extremity   5/5 Tone and bulk:normal tone throughout; no atrophy noted Sensory: Pinprick and light touch intact throughout, bilaterally Deep Tendon Reflexes: 2+ and symmetric throughout Plantars: Right: downgoing   Left: downgoing Cerebellar: normal finger-to-nose, normal rapid alternating movements and normal heel-to-shin test Gait: normal gait  and station     Lab Results: Basic Metabolic Panel: Recent Labs  Lab 10/20/17 0105 10/20/17 0113  NA  129* 129*  K 4.3 4.1  CL 98* 96*  CO2 18*  --   GLUCOSE 112* 111*  BUN 18 19  CREATININE 0.84 0.80  CALCIUM 9.1  --     CBC: Recent Labs  Lab 10/20/17 0105 10/20/17 0113  WBC 8.9  --   NEUTROABS 6.4  --   HGB 12.0 12.9  HCT 34.8* 38.0  MCV 104.8*  --   PLT 274  --     Coagulation Studies: Recent Labs    10/20/17 0105  LABPROT 23.9*  INR 2.15    Imaging: Ct Head Wo Contrast  Result Date: 10/20/2017 CLINICAL DATA:  84 year old female with dizziness and facial droop. EXAM: CT HEAD WITHOUT CONTRAST TECHNIQUE: Contiguous axial images were obtained from the base of the skull through the vertex without intravenous contrast. COMPARISON:  Head CT dated 09/30/2016 FINDINGS: Brain: There is mild age-related atrophy and chronic microvascular ischemic changes. There is no acute intracranial hemorrhage. No mass effect or midline shift. No extra-axial fluid collection. Vascular: No hyperdense vessel or unexpected calcification. Skull: Normal. Negative for fracture or focal lesion. Sinuses/Orbits: No acute finding. Other: None IMPRESSION: 1. No acute intracranial pathology. 2. Age-related atrophy and chronic microvascular ischemic changes. Electronically Signed   By: Anner Crete M.D.   On: 10/20/2017 01:49     ASSESSMENT AND PLAN  84 year old female with history of hypertension, multiple DVTs on chronic anticoagulation with Coumadin, hyperlipidemia, anemia who presents to the emergency room after having seizure-like activity witnessed by husband. Patient almost back to baseline emergency room and then noted to have another seizure with tonic-clonic jerking. She received 2 mg of Ativan. Currently post ictal. 1 g of Keppra ordered for IV load.  Partial Seizures  - Will load with 1g of Keppra - 500mg  BID maintenance - Routine EEG ( can be done outpatient )  - MRI Aaron Edelman w/wo  contrast - Limit pain medications - seizure precautions - No driving x 6 months - Check for underlying UTI/electrolyte abnormalities      Sushanth Aroor MD [facility] [phone]   If 7pm to 7am, please call on call as listed on AMION.     Sushanth Aroor [facility] Pager Number [phone]

## 2017-10-20 NOTE — ED Provider Notes (Signed)
[facility] [facility] EMERGENCY DEPARTMENT Provider Note   CSN: [id number] Arrival date & time: 10/20/17  0014     History   Chief Complaint Chief Complaint  Patient presents with  . Seizures  . Dizziness  . Loss of Consciousness    HPI Tracy Peters is a 84 y.o. female.  The history is provided by the EMS personnel. The history is limited by the condition of the patient. No language interpreter was used.  Loss of Consciousness   This is a new problem. The current episode started less than 1 hour ago. The problem occurs constantly. The problem has been resolved. She lost consciousness for a period of 1 to 5 minutes. The problem is associated with normal activity. Associated symptoms include confusion. Pertinent negatives include abdominal pain, back pain, bowel incontinence, chest pain, clumsiness, diaphoresis, focal sensory loss, nausea, seizures and weakness. Associated symptoms comments: Staring and making no sense with words . She has tried nothing for the symptoms. The treatment provided no relief. Her past medical history does not include seizures.    Past Medical History:  Diagnosis Date  . Adenomatous colon polyp   . Allergic rhinitis    uses FLonase nightly  . Anemia   . Arthritis    joint pain  . Blood transfusion   . Bronchitis    hx of-7-89yr ago  . Carpal tunnel syndrome    numbness and tingling   . Cataracts, bilateral   . Chronic anticoagulation 03/21/2013  . Congenital absence of one kidney   . Diarrhea    takes Immodium daily as needed or Lomotil   . Diverticulosis   . GERD (gastroesophageal reflux disease)    mild  . Herpes    takes Valtrex daily  . History of colon polyps   . History of DVT (deep vein thrombosis) 1998   right arm  . History of staph infection   . Hyperlipidemia    Medical MD doesn't think its absolutely necessary  . Hypertension    takes Losartan daily  . IBS (irritable bowel syndrome)    takes AElectronics engineerdaily  .  Insomnia    takes Ambien nightly as needed  . LBP (low back pain)   . Long term (current) use of anticoagulants    Dr. GBeryle Beams . Nocturia   . Osteoporosis   . Raynaud's disease   . Sarcoma of left lower extremity ([facility]    Dr. AWinfield Cunas . 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 Urinary leakage   . UTI (urinary tract infection)     Patient Active Problem List   Diagnosis Date Noted  . TIA (transient ischemic attack) 10/20/2017  . Dislocation of internal right hip prosthesis ([facility] 08/23/2017  . Anemia, iron deficiency 07/29/2017  . S/P right THA, PA 06/15/2017  . Right bursectomy 06/01/2017  . Preop exam for internal medicine 05/20/2017  . Cerumen impaction 01/27/2017  . Actinic keratosis 01/27/2017  . Primary hypercoagulable state ([place] [D68.59] 11/17/2016  . Long term (current) use of anticoagulants [Z79.01] 11/17/2016  . Osteopenia 07/14/2016  . Right-sided chest wall pain 04/17/2016  . 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 Dysuria 06/15/2015  . Cervical spondylosis with myelopathy and radiculopathy 01/18/2014  . Cervical radiculitis 12/28/2013  . Left knee pain 12/28/2013  . Paronychia 12/28/2013  . Chronic anticoagulation 03/21/2013  . Well adult exam 06/02/2012  . 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[facility]  09/16/2011  . Urticaria 05/28/2011  . DIVERTICULOSIS, COLON 04/30/2010  . COLONIC POLYPS, ADENOMATOUS, HX OF 04/30/2010  . Dyslipidemia 09/20/2009  . VERTIGO 09/20/2009  . ALLERGIC RHINITIS 03/27/2009  . INSOMNIA, PERSISTENT 09/20/2008  . PARESTHESIA 09/20/2008  . RASH AND OTHER NONSPECIFIC SKIN ERUPTION 03/22/2008  . ACUTE BRONCHITIS 02/22/2008  . Nonspecific (abnormal) findings on radiological and other examination of body structure 02/22/2008  . CHEST XRAY, ABNORMAL 02/22/2008  . GERD 09/21/2007  . Essential hypertension 03/23/2007  . LOW BACK PAIN 03/23/2007  . DVT, HX OF 03/23/2007    . Diarrhea 03/18/2007    Past Surgical History:  Procedure Laterality Date  . ABDOMINAL HYSTERECTOMY    . ANTERIOR CERVICAL DECOMP/DISCECTOMY FUSION N/A 01/18/2014   Procedure: ANTERIOR CERVICAL DECOMPRESSION/DISCECTOMY FUSION 3 LEVELS  Cervical  four/five, five/six, six/seven anterior cervical decompression with fusion interbody prosthesis with plating and bonegraft;  Surgeon: Newman Pies, MD;  Location: [facility];  Service: Neurosurgery;  Laterality: N/A;  t  . APPENDECTOMY    . BACK SURGERY     2  . BREAST SURGERY     cystectomy-benign  . CARPAL TUNNEL RELEASE Right 06/12/2014   Procedure: CARPAL TUNNEL RELEASE;  Surgeon: Newman Pies, MD;  Location: [facility] [facility];  Service: Neurosurgery;  Laterality: Right;  Right Carpal Tunnel Release  . CATARACT EXTRACTION Bilateral 02/15/2015  . CHOLECYSTECTOMY    . COLONOSCOPY    . HIP CLOSED REDUCTION Right 08/23/2017   Procedure: CLOSED MANIPULATION HIP;  Surgeon: Nicholes Stairs, MD;  Location: [facility];  Service: Orthopedics;  Laterality: Right;  . I&D of abdomen  1988   couple of wks after gallbladder removed  . KNEE SURGERY     left x 3  . LUMBAR LAMINECTOMY     X 2  . mortons neuromas removed    . OPEN SURGICAL REPAIR OF GLUTEAL TENDON Right 06/15/2017   Procedure: OPEN SURGICAL REPAIR OF GLUTEALmedius TENDON;  Surgeon: Paralee Cancel, MD;  Location: [facility];  Service: Orthopedics;  Laterality: Right;  . SHOULDER SURGERY     Right  . TOTAL HIP ARTHROPLASTY Right 06/15/2017   Procedure: Right total hip arthroplasty, bursectomy, repair of gluteal tendon, posterior approach;  Surgeon: Paralee Cancel, MD;  Location: [facility];  Service: Orthopedics;  Laterality: Right;  90 mins for all procedures together     OB History   None      Home Medications    Prior to Admission medications   Medication Sig Start Date End Date Taking? Authorizing Provider  Cholecalciferol (VITAMIN D3) 50000 units CAPS Take 1 capsule by mouth every  14 (fourteen) days. Patient taking differently: Take 50,000 Units by mouth every 14 (fourteen) days.  05/18/17   Plotnikov, Evie Lacks, MD  diphenoxylate-atropine (LOMOTIL) 2.5-0.025 MG tablet Take 1 tablet by mouth 2 (two) times daily as needed for diarrhea or loose stools. 01/27/17   Plotnikov, Evie Lacks, MD  docusate sodium (COLACE) 100 MG capsule Take 1 capsule (100 mg total) by mouth 2 (two) times daily. Patient taking differently: Take 100 mg by mouth daily.  06/18/17   Danae Orleans, PA-C  EPINEPHrine 0.3 mg/0.3 mL IJ SOAJ injection As dirrected Patient taking differently: Inject 0.3 mg into the muscle once.  01/27/17   Plotnikov, Evie Lacks, MD  ferrous sulfate 325 (65 FE) MG tablet Take 1 tablet (325 mg total) by mouth 3 (three) times daily after meals. Patient taking differently: Take 325 mg by mouth 2 (two) times daily.  06/18/17  Danae Orleans, PA-C  fish oil-omega-3 fatty acids 1000 MG capsule Take 2 g by mouth daily.     [provider]  fluticasone (FLONASE) 50 MCG/ACT nasal spray Place 2 sprays into both nostrils daily. Patient taking differently: Place 2 sprays into both nostrils at bedtime.  07/29/17   Plotnikov, Evie Lacks, MD  HYDROcodone-acetaminophen (NORCO) 7.5-325 MG tablet Take 1-2 tablets by mouth every 6 (six) hours as needed for moderate pain. 08/23/17   Nicholes Stairs, MD  loperamide (IMODIUM A-D) 2 MG tablet Take 2 mg by mouth 4 (four) times daily as needed for diarrhea or loose stools. Will take one by mouth 29mn before meals or two as needed.    [provider]  losartan (COZAAR) 100 MG tablet Take 1 tablet (100 mg total) by mouth daily. 07/29/17   Plotnikov, AEvie Lacks MD  methocarbamol (ROBAXIN) 500 MG tablet Take 1 tablet (500 mg total) by mouth every 6 (six) hours as needed for muscle spasms. 06/18/17   BDanae Orleans PA-C  ondansetron (ZOFRAN ODT) 4 MG disintegrating tablet Take 1 tablet (4 mg total) by mouth every 8 (eight) hours as needed for  nausea or vomiting. 08/23/17   RNicholes Stairs MD  temazepam (RESTORIL) 30 MG capsule Take 1 capsule (30 mg total) by mouth at bedtime as needed for sleep. 09/21/17   Plotnikov, AEvie Lacks MD  valACYclovir (VALTREX) 1000 MG tablet Take 1 tablet (1,000 mg total) by mouth 2 (two) times daily. 01/27/17   Plotnikov, AEvie Lacks MD  warfarin (COUMADIN) 5 MG tablet Take 2 tablets on Mondays of every week. All other days--take 1&1/2 tablets. Patient taking differently: Take 7.5-10 mg by mouth See admin instructions. Take 7.5 mg by mouth daily, except take 10 mg on Tuesday and Friday 01/27/17   Plotnikov, AEvie Lacks MD  zolpidem (AMBIEN) 10 MG tablet 1 & 1/2 TABLETS (15MG) BY MOUTH AT BEDTIME AS NEEDED FOR SLEEP, OK TOREPEAT IN 4HRS 07/29/17   Plotnikov, AEvie Lacks MD    Family History Family History  Problem Relation Age of Onset  . Breast cancer Sister   . Kidney disease Sister 263      nephritis  . Parkinsonism Mother   . Heart disease Brother   . Osteoarthritis Other   . Ulcerative colitis Sister   . Colon cancer Neg Hx     Social History Social History   Tobacco Use  . Smoking status: Never Smoker  . Smokeless tobacco: Never Used  Substance Use Topics  . Alcohol use: No    Alcohol/week: 0.0 oz  . Drug use: No     Allergies   Bee venom; Ivp dye [iodinated diagnostic agents]; Shellfish allergy; Amlodipine besylate; Aspirin; Atorvastatin; Cholestyramine; Codeine phosphate; Demerol [meperidine]; Diphenhydramine hcl; Doxycycline; Gentamicin sulfate; Meclizine hcl; Meperidine hcl; Metronidazole; Moxifloxacin; Sulfamethoxazole; and Penicillins   Review of Systems Review of Systems  Unable to perform ROS: Acuity of condition  Constitutional: Negative for diaphoresis.  Respiratory: Negative for shortness of breath.   Cardiovascular: Positive for syncope. Negative for chest pain.  Gastrointestinal: Negative for abdominal pain, bowel incontinence and nausea.  Musculoskeletal: Negative  for back pain.  Neurological: Positive for tremors and speech difficulty. Negative for seizures, facial asymmetry, weakness and numbness.  Psychiatric/Behavioral: Positive for confusion.     Physical Exam Updated Vital Signs BP (!) 178/92   Pulse 84   Temp 98.2 F (36.8 C) (Oral)   Resp 14   Ht 5' 4"  (1.626 m)   Wt  55.3 kg (122 lb)   SpO2 99%   BMI 20.94 kg/m   Physical Exam  Constitutional: She appears well-developed and well-nourished. No distress.  HENT:  Head: Normocephalic and atraumatic.  Mouth/Throat: No oropharyngeal exudate.  Eyes: Pupils are equal, round, and reactive to light. Conjunctivae and EOM are normal.  Neck: Normal range of motion. Neck supple. No JVD present.  Cardiovascular: Normal rate, regular rhythm, normal heart sounds and intact distal pulses.  Pulmonary/Chest: Effort normal and breath sounds normal. No stridor. She has no wheezes. She has no rales.  Abdominal: Soft. Bowel sounds are normal. She exhibits no mass. There is no tenderness. There is no rebound and no guarding.  Musculoskeletal: Normal range of motion. She exhibits no deformity.  Neurological: She is alert. She displays normal reflexes.  Word salad not answering questions appropriately.    Skin: Skin is warm and dry. Capillary refill takes less than 2 seconds.  Psychiatric: She has a normal mood and affect.     ED Treatments / Results  Labs (all labs ordered are listed, but only abnormal results are displayed) Results for orders placed or performed during the hospital encounter of 10/20/17  Ethanol  Result Value Ref Range   Alcohol, Ethyl (B) <10 <10 mg/dL  Protime-INR  Result Value Ref Range   Prothrombin Time 23.9 (H) 11.4 - 15.2 seconds   INR 2.15   APTT  Result Value Ref Range   aPTT 24 24 - 36 seconds  CBC  Result Value Ref Range   WBC 8.9 4.0 - 10.5 K/uL   RBC 3.32 (L) 3.87 - 5.11 MIL/uL   Hemoglobin 12.0 12.0 - 15.0 g/dL   HCT 34.8 (L) 36.0 - 46.0 %   MCV 104.8 (H)  78.0 - 100.0 fL   MCH 36.1 (H) 26.0 - 34.0 pg   MCHC 34.5 30.0 - 36.0 g/dL   RDW 13.3 11.5 - 15.5 %   Platelets 274 150 - 400 K/uL  Differential  Result Value Ref Range   Neutrophils Relative % 73 %   Neutro Abs 6.4 1.7 - 7.7 K/uL   Lymphocytes Relative 17 %   Lymphs Abs 1.5 0.7 - 4.0 K/uL   Monocytes Relative 10 %   Monocytes Absolute 0.9 0.1 - 1.0 K/uL   Eosinophils Relative 0 %   Eosinophils Absolute 0.0 0.0 - 0.7 K/uL   Basophils Relative 0 %   Basophils Absolute 0.0 0.0 - 0.1 K/uL  Comprehensive metabolic panel  Result Value Ref Range   Sodium 129 (L) 135 - 145 mmol/L   Potassium 4.3 3.5 - 5.1 mmol/L   Chloride 98 (L) 101 - 111 mmol/L   CO2 18 (L) 22 - 32 mmol/L   Glucose, Bld 112 (H) 65 - 99 mg/dL   BUN 18 6 - 20 mg/dL   Creatinine, Ser 0.84 0.44 - 1.00 mg/dL   Calcium 9.1 8.9 - 10.3 mg/dL   Total Protein 6.7 6.5 - 8.1 g/dL   Albumin 3.6 3.5 - 5.0 g/dL   AST 27 15 - 41 U/L   ALT 13 (L) 14 - 54 U/L   Alkaline Phosphatase 44 38 - 126 U/L   Total Bilirubin 0.8 0.3 - 1.2 mg/dL   GFR calc non Af Amer >60 >60 mL/min   GFR calc Af Amer >60 >60 mL/min   Anion gap 13 5 - 15  Urinalysis, Routine w reflex microscopic  Result Value Ref Range   Color, Urine STRAW (A) YELLOW   APPearance CLEAR  CLEAR   Specific Gravity, Urine 1.009 1.005 - 1.030   pH 5.0 5.0 - 8.0   Glucose, UA NEGATIVE NEGATIVE mg/dL   Hgb urine dipstick MODERATE (A) NEGATIVE   Bilirubin Urine NEGATIVE NEGATIVE   Ketones, ur NEGATIVE NEGATIVE mg/dL   Protein, ur 30 (A) NEGATIVE mg/dL   Nitrite NEGATIVE NEGATIVE   Leukocytes, UA NEGATIVE NEGATIVE   RBC / HPF 0-5 0 - 5 RBC/hpf   WBC, UA 0-5 0 - 5 WBC/hpf   Bacteria, UA RARE (A) NONE SEEN   Hyaline Casts, UA PRESENT   CBG monitoring, ED  Result Value Ref Range   Glucose-Capillary 115 (H) 65 - 99 mg/dL  I-Stat Chem 8, ED  Result Value Ref Range   Sodium 129 (L) 135 - 145 mmol/L   Potassium 4.1 3.5 - 5.1 mmol/L   Chloride 96 (L) 101 - 111 mmol/L   BUN  19 6 - 20 mg/dL   Creatinine, Ser 0.80 0.44 - 1.00 mg/dL   Glucose, Bld 111 (H) 65 - 99 mg/dL   Calcium, Ion 1.09 (L) 1.15 - 1.40 mmol/L   TCO2 21 (L) 22 - 32 mmol/L   Hemoglobin 12.9 12.0 - 15.0 g/dL   HCT 38.0 36.0 - 46.0 %  I-stat troponin, ED  Result Value Ref Range   Troponin i, poc 0.02 0.00 - 0.08 ng/mL   Comment 3           Ct Head Wo Contrast  Result Date: 10/20/2017 CLINICAL DATA:  84 year old female with dizziness and facial droop. EXAM: CT HEAD WITHOUT CONTRAST TECHNIQUE: Contiguous axial images were obtained from the base of the skull through the vertex without intravenous contrast. COMPARISON:  Head CT dated 09/30/2016 FINDINGS: Brain: There is mild age-related atrophy and chronic microvascular ischemic changes. There is no acute intracranial hemorrhage. No mass effect or midline shift. No extra-axial fluid collection. Vascular: No hyperdense vessel or unexpected calcification. Skull: Normal. Negative for fracture or focal lesion. Sinuses/Orbits: No acute finding. Other: None IMPRESSION: 1. No acute intracranial pathology. 2. Age-related atrophy and chronic microvascular ischemic changes. Electronically Signed   By: Anner Crete M.D.   On: 10/20/2017 01:49    EKG  EKG Interpretation  Date/Time:  Tuesday Oct 20 2017 00:32:22 EDT Ventricular Rate:  99 PR Interval:    QRS Duration: 89 QT Interval:  353 QTC Calculation: 453 R Axis:   67 Text Interpretation:  Sinus rhythm Anterior infarct, old Confirmed by Randal Buba, Jakerria Kingbird ([id number]) on 10/20/2017 2:42:55 AM       Radiology Ct Head Wo Contrast  Result Date: 10/20/2017 CLINICAL DATA:  84 year old female with dizziness and facial droop. EXAM: CT HEAD WITHOUT CONTRAST TECHNIQUE: Contiguous axial images were obtained from the base of the skull through the vertex without intravenous contrast. COMPARISON:  Head CT dated 09/30/2016 FINDINGS: Brain: There is mild age-related atrophy and chronic microvascular ischemic changes. There is  no acute intracranial hemorrhage. No mass effect or midline shift. No extra-axial fluid collection. Vascular: No hyperdense vessel or unexpected calcification. Skull: Normal. Negative for fracture or focal lesion. Sinuses/Orbits: No acute finding. Other: None IMPRESSION: 1. No acute intracranial pathology. 2. Age-related atrophy and chronic microvascular ischemic changes. Electronically Signed   By: Anner Crete M.D.   On: 10/20/2017 01:49    Procedures Procedures (including critical care time)  Medications Ordered in ED Medications  labetalol (NORMODYNE,TRANDATE) injection 10 mg (10 mg Intravenous Given 10/20/17 0050)     Case d/w Dr. Lorraine Lax, no code stroke please  admit to medicine  235 Call to bedside patient is having a tonic clonic seizure. Ativan administered immediately.  Seizure terminated.   Keppra ordered  Case d/w Dr. Lorraine Lax who will see the patient   Final Clinical Impressions(s) / ED Diagnoses   Final diagnoses:  TIA (transient ischemic attack)    Admit to medicine     Amadeo Coke, MD 10/20/17 [id number]

## 2017-10-20 NOTE — ED Notes (Addendum)
Pt's family came running out of room stating pt was seizing. Seizure lasted about 1-2 minutes long. Pt has new oral trauma to tongue with minimal bleeding.

## 2017-10-20 NOTE — Progress Notes (Signed)
EEG Completed; Results Pending  

## 2017-10-20 NOTE — Care Management Note (Signed)
Case Management Note  Patient Details  Name: Tracy Peters MRN: [id number] Date of Birth: [birth year]  Subjective/Objective:                  84 y.o. female with history of HTN, multiple DVTs on chronic anticoagulation with Coumadin, hyperlipidemia, anemia who presents to the ED after having seizure-like activity witnessed by husband.  From home with husband.  Action/Plan: Admit status OBSERVATION (SEIZURE); anticipate discharge [place].   Expected Discharge Date:  (unknown)               Expected Discharge Plan:  [place]  In-House Referral:     Discharge planning Services  CM Consult  Status of Service:  In process, will continue to follow  If discussed at Long Length of Stay Meetings, dates discussed:    Additional Comments:  Fuller Mandril, RN 10/20/2017, 11:17 AM

## 2017-10-20 NOTE — ED Notes (Signed)
Got patient up to the bathroom patient use walker and I help assist

## 2017-10-20 NOTE — H&P (Addendum)
History and Physical    Tracy Peters XBM:[id number] DOB: 04-04-1934 DOA: 10/20/2017  PCP: Cassandria Anger, MD  Patient coming from: Home.  History obtained from patient husband and daughter.  Patient is encephalopathic postictal.  Chief Complaint: Seizure.  HPI: Tracy Peters is a 84 y.o. 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  As per the husband patient has been feeling increasingly irritable last 2 days.  Patient 2 days ago had taken 1 dose of temazepam for sleep.  That was the first time patient had taken it.  Patient is also on hydrocodone for recent hip surgery.  Last evening around 11 PM patient suddenly started having shaking of the right upper extremity and was not talking correctly and trying to take some medications involuntarily.  Patient has been made to lie on the floor and called EMS.  ED Course: In the ER patient appeared confused.  CT head was unremarkable.  Neurologist was consulted.  Following which patient started having generalized tonic-clonic seizure and bit her tongue.  Lasted for around half minute.  Patient was given 2 mg IV Ativan following which patient became postictal.  CBG was 137.  Basic metabolic panel shows a sodium of 129.  Patient is afebrile.  Neurologist recommended Keppra loading dose and plan on IV q. 12.  Admitted for further seizure management.  Review of Systems: As per HPI, rest all negative.   Past Medical History:  Diagnosis Date  . Adenomatous colon polyp   . Allergic rhinitis    uses FLonase nightly  . Anemia   . Arthritis    joint pain  . Blood transfusion   . Bronchitis    hx of-7-50yrs ago  . Carpal tunnel syndrome    numbness and tingling   . Cataracts, bilateral   . Chronic anticoagulation 03/21/2013  . Congenital absence of one kidney   . Diarrhea    takes Immodium daily as needed or Lomotil     . Diverticulosis   . GERD (gastroesophageal reflux disease)    mild  . Herpes    takes Valtrex daily  . History of colon polyps   . History of DVT (deep vein thrombosis) 1998   right arm  . History of staph infection   . Hyperlipidemia    Medical MD doesn't think its absolutely necessary  . Hypertension    takes Losartan daily  . IBS (irritable bowel syndrome)    takes Electronics engineer daily  . Insomnia    takes Ambien nightly as needed  . LBP (low back pain)   . Long term (current) use of anticoagulants    Dr. Beryle Beams  . Nocturia   . Osteoporosis   . Raynaud's disease   . 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 Winfield Cunas  . 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[place]) 09/16/2011   Right digital artery 1998 idiopathic   . Urinary leakage   . UTI (urinary tract infection)     Past Surgical History:  Procedure Laterality Date  . ABDOMINAL HYSTERECTOMY    . ANTERIOR CERVICAL DECOMP/DISCECTOMY FUSION N/A 01/18/2014   Procedure: ANTERIOR CERVICAL DECOMPRESSION/DISCECTOMY FUSION 3 LEVELS  Cervical  four/five, five/six, six/seven anterior cervical decompression with fusion interbody prosthesis with plating and bonegraft;  Surgeon: Newman Pies, MD;  Location: [facility];  Service: Neurosurgery;  Laterality: N/A;  t  . APPENDECTOMY    .  BACK SURGERY     2  . BREAST SURGERY     cystectomy-benign  . CARPAL TUNNEL RELEASE Right 06/12/2014   Procedure: CARPAL TUNNEL RELEASE;  Surgeon: Newman Pies, MD;  Location: [facility];  Service: Neurosurgery;  Laterality: Right;  Right Carpal Tunnel Release  . CATARACT EXTRACTION Bilateral 02/15/2015  . CHOLECYSTECTOMY    . COLONOSCOPY    . HIP CLOSED REDUCTION Right 08/23/2017   Procedure: CLOSED MANIPULATION HIP;  Surgeon: Nicholes Stairs, MD;  Location: [facility];  Service: Orthopedics;  Laterality: Right;  . I&D of abdomen  1988   couple of wks after gallbladder removed  . KNEE SURGERY     left x 3  . LUMBAR LAMINECTOMY     X 2   . mortons neuromas removed    . OPEN SURGICAL REPAIR OF GLUTEAL TENDON Right 06/15/2017   Procedure: OPEN SURGICAL REPAIR OF GLUTEALmedius TENDON;  Surgeon: Paralee Cancel, MD;  Location: [facility];  Service: Orthopedics;  Laterality: Right;  . SHOULDER SURGERY     Right  . TOTAL HIP ARTHROPLASTY Right 06/15/2017   Procedure: Right total hip arthroplasty, bursectomy, repair of gluteal tendon, posterior approach;  Surgeon: Paralee Cancel, MD;  Location: [facility];  Service: Orthopedics;  Laterality: Right;  90 mins for all procedures together     reports that she has never smoked. She has never used smokeless tobacco. She reports that she does not drink alcohol or use drugs.  Allergies  Allergen Reactions  . Bee Venom Anaphylaxis and Other (See Comments)    Yellow jackets, wasps as well  . Ivp Dye [Iodinated Diagnostic Agents] Anaphylaxis  . Shellfish Allergy Anaphylaxis  . Amlodipine Besylate Swelling  . Aspirin Other (See Comments)    REACTION: unspecified  . Atorvastatin Diarrhea  . Cholestyramine Other (See Comments)    REACTION: mouth irritation  . Codeine Phosphate Nausea And Vomiting  . Demerol [Meperidine] Other (See Comments)    Severe Hallucination!!!!  . Diphenhydramine Hcl Other (See Comments)    hyperactive  . Doxycycline Nausea Only  . Gentamicin Sulfate Other (See Comments)    REACTION: unspecified  . Meclizine Hcl Other (See Comments)    REACTION: more dizziness on it  . Meperidine Hcl Other (See Comments)    Unknown  . Metronidazole Other (See Comments)    REACTION: bad taste  . Moxifloxacin Other (See Comments)    REACTION: insomnia Able to use Cipro  . Sulfamethoxazole Other (See Comments)    Kidney problems  . Penicillins Swelling, Rash and Other (See Comments)    Has patient had a PCN reaction causing immediate rash, facial/tongue/throat swelling, SOB or lightheadedness with hypotension: Yes Has patient had a PCN reaction causing severe rash involving mucus  membranes or skin necrosis: No Has patient had a PCN reaction that required hospitalization: No Has patient had a PCN reaction occurring within the last 10 years: No If all of the above answers are "NO", then may proceed with Cephalosporin use.     Family History  Problem Relation Age of Onset  . Breast cancer Sister   . Kidney disease Sister 61       nephritis  . Parkinsonism Mother   . Heart disease Brother   . Osteoarthritis Other   . Ulcerative colitis Sister   . Colon cancer Neg Hx     Prior to Admission medications   Medication Sig Start Date End Date Taking? Authorizing Provider  Cholecalciferol (VITAMIN D3) 50000 units CAPS Take 1  capsule by mouth every 14 (fourteen) days. Patient taking differently: Take 50,000 Units by mouth every 14 (fourteen) days.  05/18/17   Plotnikov, Evie Lacks, MD  diphenoxylate-atropine (LOMOTIL) 2.5-0.025 MG tablet Take 1 tablet by mouth 2 (two) times daily as needed for diarrhea or loose stools. 01/27/17   Plotnikov, Evie Lacks, MD  docusate sodium (COLACE) 100 MG capsule Take 1 capsule (100 mg total) by mouth 2 (two) times daily. Patient taking differently: Take 100 mg by mouth daily.  06/18/17   Danae Orleans, PA-C  EPINEPHrine 0.3 mg/0.3 mL IJ SOAJ injection As dirrected Patient taking differently: Inject 0.3 mg into the muscle once.  01/27/17   Plotnikov, Evie Lacks, MD  ferrous sulfate 325 (65 FE) MG tablet Take 1 tablet (325 mg total) by mouth 3 (three) times daily after meals. Patient taking differently: Take 325 mg by mouth 2 (two) times daily.  06/18/17   Danae Orleans, PA-C  fish oil-omega-3 fatty acids 1000 MG capsule Take 2 g by mouth daily.     [provider]  fluticasone (FLONASE) 50 MCG/ACT nasal spray Place 2 sprays into both nostrils daily. Patient taking differently: Place 2 sprays into both nostrils at bedtime.  07/29/17   Plotnikov, Evie Lacks, MD  HYDROcodone-acetaminophen (NORCO) 7.5-325 MG tablet Take 1-2 tablets by mouth  every 6 (six) hours as needed for moderate pain. 08/23/17   Nicholes Stairs, MD  loperamide (IMODIUM A-D) 2 MG tablet Take 2 mg by mouth 4 (four) times daily as needed for diarrhea or loose stools. Will take one by mouth 14min before meals or two as needed.    [provider]  losartan (COZAAR) 100 MG tablet Take 1 tablet (100 mg total) by mouth daily. 07/29/17   Plotnikov, Evie Lacks, MD  methocarbamol (ROBAXIN) 500 MG tablet Take 1 tablet (500 mg total) by mouth every 6 (six) hours as needed for muscle spasms. 06/18/17   Danae Orleans, PA-C  ondansetron (ZOFRAN ODT) 4 MG disintegrating tablet Take 1 tablet (4 mg total) by mouth every 8 (eight) hours as needed for nausea or vomiting. 08/23/17   Nicholes Stairs, MD  temazepam (RESTORIL) 30 MG capsule Take 1 capsule (30 mg total) by mouth at bedtime as needed for sleep. 09/21/17   Plotnikov, Evie Lacks, MD  valACYclovir (VALTREX) 1000 MG tablet Take 1 tablet (1,000 mg total) by mouth 2 (two) times daily. 01/27/17   Plotnikov, Evie Lacks, MD  warfarin (COUMADIN) 5 MG tablet Take 2 tablets on Mondays of every week. All other days--take 1&1/2 tablets. Patient taking differently: Take 7.5-10 mg by mouth See admin instructions. Take 7.5 mg by mouth daily, except take 10 mg on Tuesday and Friday 01/27/17   Plotnikov, Evie Lacks, MD  zolpidem (AMBIEN) 10 MG tablet 1 & 1/2 TABLETS (15MG ) BY MOUTH AT BEDTIME AS NEEDED FOR SLEEP, OK TOREPEAT IN 4HRS 07/29/17   Plotnikov, Evie Lacks, MD    Physical Exam: Vitals:   10/20/17 0201 10/20/17 0230 10/20/17 0300 10/20/17 0330  BP: (!) 178/92 (!) 182/89 139/61 111/63  Pulse: 84 90 97 96  Resp: 14 15 15 14   Temp:      TempSrc:      SpO2: 99% 99% 100% 100%  Weight:      Height:          Constitutional: Moderately built and nourished. Vitals:   10/20/17 0201 10/20/17 0230 10/20/17 0300 10/20/17 0330  BP: (!) 178/92 (!) 182/89 139/61 111/63  Pulse: 84 90 97  96  Resp: 14 15 15 14   Temp:        TempSrc:      SpO2: 99% 99% 100% 100%  Weight:      Height:       Eyes: Anicteric no pallor. ENMT: No discharge from the ears eyes nose or mouth.  Tongue bite. Neck: No mass felt.  No neck rigidity. Respiratory: No rhonchi or crepitations. Cardiovascular: S1-S2 heard no murmurs appreciated. Abdomen: Soft nontender bowel sounds present. Musculoskeletal: No edema.  No joint effusion. Skin: No rash.  Skin appears warm. Neurologic: Patient is encephalopathic postictal. Psychiatric: Encephalopathic postictal.   Labs on Admission: I have personally reviewed following labs and imaging studies  CBC: Recent Labs  Lab 10/20/17 0105 10/20/17 0113  WBC 8.9  --   NEUTROABS 6.4  --   HGB 12.0 12.9  HCT 34.8* 38.0  MCV 104.8*  --   PLT 274  --    Basic Metabolic Panel: Recent Labs  Lab 10/20/17 0105 10/20/17 0113  NA 129* 129*  K 4.3 4.1  CL 98* 96*  CO2 18*  --   GLUCOSE 112* 111*  BUN 18 19  CREATININE 0.84 0.80  CALCIUM 9.1  --    GFR: Estimated Creatinine Clearance: 46.8 mL/min (by C-G formula based on SCr of 0.8 mg/dL). Liver Function Tests: Recent Labs  Lab 10/20/17 0105  AST 27  ALT 13*  ALKPHOS 44  BILITOT 0.8  PROT 6.7  ALBUMIN 3.6   No results for input(s): LIPASE, AMYLASE in the last 168 hours. No results for input(s): AMMONIA in the last 168 hours. Coagulation Profile: Recent Labs  Lab 10/20/17 0105  INR 2.15   Cardiac Enzymes: No results for input(s): CKTOTAL, CKMB, CKMBINDEX, TROPONINI in the last 168 hours. BNP (last 3 results) No results for input(s): PROBNP in the last 8760 hours. HbA1C: No results for input(s): HGBA1C in the last 72 hours. CBG: Recent Labs  Lab 10/20/17 0024 10/20/17 0245  GLUCAP 115* 137*   Lipid Profile: No results for input(s): CHOL, HDL, LDLCALC, TRIG, CHOLHDL, LDLDIRECT in the last 72 hours. Thyroid Function Tests: No results for input(s): TSH, T4TOTAL, FREET4, T3FREE, THYROIDAB in the last 72 hours. Anemia  Panel: No results for input(s): VITAMINB12, FOLATE, FERRITIN, TIBC, IRON, RETICCTPCT in the last 72 hours. Urine analysis:    Component Value Date/Time   COLORURINE STRAW (A) 10/20/2017 0106   APPEARANCEUR CLEAR 10/20/2017 0106   LABSPEC 1.009 10/20/2017 0106   PHURINE 5.0 10/20/2017 0106   GLUCOSEU NEGATIVE 10/20/2017 0106   GLUCOSEU NEGATIVE 01/03/2016 1043   HGBUR MODERATE (A) 10/20/2017 0106   BILIRUBINUR NEGATIVE 10/20/2017 0106   BILIRUBINUR neg 06/15/2015 1110   KETONESUR NEGATIVE 10/20/2017 0106   PROTEINUR 30 (A) 10/20/2017 0106   UROBILINOGEN 0.2 01/03/2016 1043   NITRITE NEGATIVE 10/20/2017 0106   LEUKOCYTESUR NEGATIVE 10/20/2017 0106   Sepsis Labs: @LABRCNTIP (procalcitonin:4,lacticidven:4) )No results found for this or any previous visit (from the past 240 hour(s)).   Radiological Exams on Admission: Ct Head Wo Contrast  Result Date: 10/20/2017 CLINICAL DATA:  84 year old female with dizziness and facial droop. EXAM: CT HEAD WITHOUT CONTRAST TECHNIQUE: Contiguous axial images were obtained from the base of the skull through the vertex without intravenous contrast. COMPARISON:  Head CT dated 09/30/2016 FINDINGS: Brain: There is mild age-related atrophy and chronic microvascular ischemic changes. There is no acute intracranial hemorrhage. No mass effect or midline shift. No extra-axial fluid collection. Vascular: No hyperdense vessel or unexpected calcification. Skull:  Normal. Negative for fracture or focal lesion. Sinuses/Orbits: No acute finding. Other: None IMPRESSION: 1. No acute intracranial pathology. 2. Age-related atrophy and chronic microvascular ischemic changes. Electronically Signed   By: Anner Crete M.D.   On: 10/20/2017 01:49   Dg Hips Bilat W Or Wo Pelvis 5 Views  Result Date: 10/20/2017 CLINICAL DATA:  84 year old female with fall. EXAM: DG HIP (WITH OR WITHOUT PELVIS) 5+V BILAT COMPARISON:  None. FINDINGS: There is no acute fracture or dislocation. The  bones are osteopenic. There is a total right hip arthroplasty. The arthroplasty components appear intact and in anatomic alignment. The soft tissues are grossly unremarkable. IMPRESSION: No acute fracture or dislocation. Electronically Signed   By: Anner Crete M.D.   On: 10/20/2017 03:00    EKG: Independently reviewed.  Normal sinus rhythm with old anterior infarct.  Assessment/Plan Principal Problem:   Seizures ([place]) Active Problems:   Essential hypertension   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[facility])   Primary hypercoagulable state ([place]) [D68.59]   Seizure ([place])    1. Seizures generalized tonic-clonic witnessed -discussed with neurologist.  Patient was loaded with Keppra and continued 500 mg IV every 12.  EEG MRI brain with and without contrast.  PRN Ativan for any seizures.  Swallow evaluation.  Chest x-ray and UA are pending. 2. History of right radial artery thrombosis on Coumadin which is therapeutic.  Until patient can unable to swallow we will keep patient on heparin if INR subtherapeutic. 3. History of hypertension -PRN IV hydralazine.  Continue ARB once patient can swallow. 4. History of iron deficiency anemia on iron supplements. 5. Mild hyponatremia -appears to be at the level recently.  Closely follow metabolic panel.  Gently hydrate.  Urine studies ordered.   DVT prophylaxis: Heparin. Code Status: Full code. Family Communication: Patient's husband and daughter. Disposition Plan: Home. Consults called: Neurology. Admission status: Observation.   Rise Patience MD [facility] Pager [phone].  If 7PM-7AM, please contact night-coverage [email or web address] Password [facility]  10/20/2017, 4:23 AM

## 2017-10-20 NOTE — Progress Notes (Signed)
Received report on pt.

## 2017-10-20 NOTE — ED Notes (Signed)
Admitting MD Kakrakandy at bedside.  

## 2017-10-21 ENCOUNTER — Ambulatory Visit: Payer: 59 | Admitting: Internal Medicine

## 2017-10-21 DIAGNOSIS — D6859 Other primary thrombophilia: Secondary | ICD-10-CM | POA: Diagnosis not present

## 2017-10-21 DIAGNOSIS — I10 Essential (primary) hypertension: Secondary | ICD-10-CM | POA: Diagnosis not present

## 2017-10-21 DIAGNOSIS — R52 Pain, unspecified: Secondary | ICD-10-CM | POA: Diagnosis not present

## 2017-10-21 DIAGNOSIS — R569 Unspecified convulsions: Secondary | ICD-10-CM | POA: Diagnosis not present

## 2017-10-21 LAB — GLUCOSE, CAPILLARY
GLUCOSE-CAPILLARY: 89 mg/dL (ref 65–99)
GLUCOSE-CAPILLARY: 137 mg/dL — AB (ref 65–99)
GLUCOSE-CAPILLARY: 127 mg/dL — AB (ref 65–99)
Glucose-Capillary: 99 mg/dL (ref 65–99)

## 2017-10-21 LAB — CBC
HCT: 32.7 % — ABNORMAL LOW (ref 36.0–46.0)
HEMOGLOBIN: 11 g/dL — AB (ref 12.0–15.0)
MCH: 35.6 pg — AB (ref 26.0–34.0)
MCHC: 33.6 g/dL (ref 30.0–36.0)
MCV: 105.8 fL — AB (ref 78.0–100.0)
Platelets: 259 10*3/uL (ref 150–400)
RBC: 3.09 MIL/uL — AB (ref 3.87–5.11)
RDW: 13.4 % (ref 11.5–15.5)
WBC: 9.5 10*3/uL (ref 4.0–10.5)

## 2017-10-21 LAB — BASIC METABOLIC PANEL
Anion gap: 8 (ref 5–15)
BUN: 9 mg/dL (ref 6–20)
CHLORIDE: 104 mmol/L (ref 101–111)
CO2: 23 mmol/L (ref 22–32)
Calcium: 8.8 mg/dL — ABNORMAL LOW (ref 8.9–10.3)
Creatinine, Ser: 0.73 mg/dL (ref 0.44–1.00)
GFR calc Af Amer: >60 mL/min (ref >60)
GFR calc non Af Amer: >60 mL/min (ref >60)
GLUCOSE: 99 mg/dL (ref 65–99)
Potassium: 3.8 mmol/L (ref 3.5–5.1)
Sodium: 135 mmol/L (ref 135–145)

## 2017-10-21 LAB — SEDIMENTATION RATE: Sed Rate: 38 mm/hr — ABNORMAL HIGH (ref 0–22)

## 2017-10-21 LAB — FOLATE: FOLATE: 23 ng/mL (ref >5.9)

## 2017-10-21 LAB — PROTIME-INR
INR: 2.68
PROTHROMBIN TIME: 28.3 s — AB (ref 11.4–15.2)

## 2017-10-21 LAB — VITAMIN B12: Vitamin B-12: 351 pg/mL (ref 180–914)

## 2017-10-21 LAB — TROPONIN I: Troponin I: 0.03 ng/mL (ref <0.03)

## 2017-10-21 LAB — C-REACTIVE PROTEIN: CRP: 2.4 mg/dL — ABNORMAL HIGH (ref <1.0)

## 2017-10-21 MED ORDER — VALPROATE SODIUM 500 MG/5ML IV SOLN
20.0000 mg/kg | Freq: Once | INTRAVENOUS | Status: AC
  Administered 2017-10-21: 1106 mg via INTRAVENOUS
  Filled 2017-10-21: qty 11.06

## 2017-10-21 MED ORDER — VALPROATE SODIUM 500 MG/5ML IV SOLN
15.0000 mg/kg/d | Freq: Three times a day (TID) | INTRAVENOUS | Status: DC
  Administered 2017-10-21 – 2017-10-22 (×2): 277 mg via INTRAVENOUS
  Filled 2017-10-21 (×3): qty 2.77

## 2017-10-21 MED ORDER — WARFARIN SODIUM 5 MG PO TABS
5.0000 mg | ORAL_TABLET | Freq: Once | ORAL | Status: AC
  Administered 2017-10-21: 5 mg via ORAL
  Filled 2017-10-21: qty 1

## 2017-10-21 MED ORDER — MUPIROCIN 2 % EX OINT
TOPICAL_OINTMENT | Freq: Every day | CUTANEOUS | Status: DC
  Administered 2017-10-21: 15:00:00 via TOPICAL
  Administered 2017-10-22: 1 via TOPICAL
  Filled 2017-10-21: qty 22

## 2017-10-21 MED ORDER — FLUTICASONE PROPIONATE 50 MCG/ACT NA SUSP
1.0000 | Freq: Every day | NASAL | Status: DC
  Administered 2017-10-21: 1 via NASAL
  Filled 2017-10-21: qty 16

## 2017-10-21 MED ORDER — ENSURE ENLIVE PO LIQD
237.0000 mL | Freq: Two times a day (BID) | ORAL | Status: DC
  Administered 2017-10-22: 237 mL via ORAL

## 2017-10-21 NOTE — Care Management Note (Signed)
Case Management Note  Patient Details  Name: Tracy Peters MRN: [id number] Date of Birth: 10-04-1933  Subjective/Objective:    Presents with seizure like activity from home with husband. Hx of right radial artery thrombosis on Coumadin, iron deficiency anemia, hypertension. Owns 2 walkers/ 2 canes per pt. Requires min.assist with ADL's PTA.         Mila Homer (Spouse) Essie Hart (Daughter)    [phone]      PCP: Donley Redder  Action/Plan: Transition to home with home health services to follow (RN,PT) when medically stable. Husband states @ d/c pt will also receive private duty services from [facility], 9am-5pm, qd.  Husband states will provide transportation to home.  Expected Discharge Date:  10/22/2017               Expected Discharge Plan:  [place]  In-House Referral:     Discharge planning Services  CM Consult  Post Acute Care Choice:    Choice offered to:  Spouse, Patient  DME Arranged:    DME Agency:  [facility]formerly [employer]  [place]:  RN, PT [place] Agency:  [facility] (formerly [facility] pending MD's order. NCM has requested orders from MD.  Status of Service:  In process, will continue to follow  If discussed at Long Length of Stay Meetings, dates discussed:    Additional Comments:  Sharin Mons, RN 10/21/2017, 11:41 AM

## 2017-10-21 NOTE — Evaluation (Signed)
Occupational Therapy Evaluation and Discharge Patient Details Name: Tracy Peters MRN: [id number] DOB: 10-Sep-1933 Today's Date: 10/21/2017    History of Present Illness 84 y.o. 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   Clinical Impression   Pt admitted with above and presents to OT with deficits impacting participation in ADLs (see OT Problem list below).  Limited eval participation secondary to fatigue and lunch tray arriving.  Pt reports being independent with ADLs prior to Friday 5/3 and then having a quick downturn requiring increased assistance with all self-care tasks.  Pt's daughter present and reports they had had a PCA previously who has already agreed to return to assist with self-care and homemaking tasks.  Pt and daughter report good support system and ability to provide physical assistance as pt needs.  Pt reports having recommended DME in bathroom.  [name], but would benefit from an evaluation at home to assess needs once medically stable to d/c home.    Follow Up Recommendations  Home health OT;Supervision/Assistance - 24 hour    Equipment Recommendations  None recommended by OT    Recommendations for Other Services       Precautions / Restrictions Precautions Precautions: Fall             ADL either performed or assessed with clinical judgement   ADL Overall ADL's : Needs assistance/impaired                                       General ADL Comments: Pt reports having personal care attendant M-F 9-5 that assists with self-care tasks that will be assisting upon d/c home.  Pt not interested in engaging in any self-care tasks as she reports fatigue and wanting to eat lunch.                  Pertinent Vitals/Pain Pain Assessment: No/denies pain     Hand Dominance Right    Extremity/Trunk Assessment Upper Extremity Assessment Upper Extremity Assessment: Generalized weakness           Communication Communication Communication: No difficulties   Cognition Arousal/Alertness: Awake/alert Behavior During Therapy: WFL for tasks assessed/performed Overall Cognitive Status: Within Functional Limits for tasks assessed                                                Home Living Family/patient expects to be discharged to:: Private residence Living Arrangements: Spouse/significant other Available Help at Discharge: Family;Available 24 hours/day Type of Home: House Home Access: Stairs to enter [employer] of Steps: 2   Home Layout: One level     Bathroom Shower/Tub: Occupational psychologist: Handicapped height Bathroom Accessibility: Yes   Home Equipment: Other (comment);Walker - 2 wheels;Grab bars - toilet;Grab bars - tub/shower;Hand held shower head;Shower seat          Prior Functioning/Environment Level of Independence: Needs assistance    ADL's / Homemaking Assistance Needed: Pt had been independent with ADLs prior to last few days to which she reports requiring mod-max assist for self-care tasks            OT Problem List:  Decreased strength;Decreased activity tolerance;Impaired balance (sitting and/or standing)         OT Goals(Current goals can be found in the care plan section) Acute Rehab OT Goals Patient Stated Goal: to go home OT Goal Formulation: All assessment and education complete, DC therapy      AM-PAC PT "6 Clicks" Daily Activity     Outcome Measure Help from another person eating meals?: A Lot Help from another person taking care of personal grooming?: A Little Help from another person toileting, which includes using toliet, bedpan, or urinal?: A Lot Help from another person bathing (including washing, rinsing, drying)?: A Lot Help from another person to put on and taking off  regular upper body clothing?: A Lot Help from another person to put on and taking off regular lower body clothing?: A Lot 6 Click Score: 13   End of Session    Activity Tolerance: Patient limited by fatigue Patient left: in bed;with bed alarm set;with family/visitor present  OT Visit Diagnosis: Unsteadiness on feet (R26.81);Muscle weakness (generalized) (M62.81);Other symptoms and signs involving the nervous system (W09.811)                Time: 9147-8295 OT Time Calculation (min): 9 min Charges:  OT General Charges $OT Visit: 1 Visit OT Evaluation $OT Eval Moderate Complexity: [place], [place], [place] 10/21/2017, 4:12 PM

## 2017-10-21 NOTE — Telephone Encounter (Signed)
Noted  Rx removed  Thx

## 2017-10-21 NOTE — Progress Notes (Signed)
Initial Nutrition Assessment  DOCUMENTATION CODES:   Not applicable  INTERVENTION:  Ensure Enlive po BID, each supplement provides 350 kcal and 20 grams of protein  NUTRITION DIAGNOSIS:   Increased nutrient needs related to acute illness as evidenced by estimated needs.  GOAL:   Patient will meet greater than or equal to 90% of their needs  MONITOR:   PO intake, Supplement acceptance, Weight trends  REASON FOR ASSESSMENT:   Consult Assessment of nutrition requirement/status  ASSESSMENT:    Tracy Peters is a 84 y.o. 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 She had a tonic-clonic seizure in the ED as well, bit her tongue.  Reports 6 weeks of r-sided jaw pain with swallowing  RD consulted for assessment Spoke with patient, husband, and daughter at bedside. Patient was helped to bathroom by nurse tech during visit, was unable to complete nutrition focused physical exam.  She reports a UBW of 122 pounds along with a small weight gain over the past couple of weeks.  Normal PO intake consists of a big bowl of cereal, with fruit, coffee, and occasionally cheese toast. Lunch is a pack of "nabs," a cookie, and a diet soda Dinner can be take out or something they cook at home but often consists of a meat, starch, and vegetable  For dinner yesterday she ate 100% of meat loaf, mashed potatoes, green beans and banana pudding. Also ate 100% of breakfast per daughter.  She denies severe jaw pain at this time.  Iterates that her right side of her mouth is the only side she can chew on, and this leads to some jaw pain/soreness.  She was agreeable to consuming ensure during admission to provide extra protein in addition to diet.  Monitor PO intake.  Labs reviewed Medications reviewed and include:  NS at 60mL/hr   NUTRITION - FOCUSED PHYSICAL EXAM:   Most Recent Value  Orbital Region  Unable to assess  Upper Arm Region  Unable to assess  Thoracic and Lumbar Region  Unable to assess  Buccal Region  Unable to assess  Temple Region  Unable to assess  Clavicle Bone Region  Unable to assess  Clavicle and Acromion Bone Region  Unable to assess  Scapular Bone Region  Unable to assess  Dorsal Hand  Unable to assess  Patellar Region  Unable to assess  Anterior Thigh Region  Unable to assess  Posterior Calf Region  Unable to assess  Edema (RD Assessment)  Unable to assess       Diet Order:   Diet Order           Diet regular Room service appropriate? Yes; Fluid consistency: Thin  Diet effective now          EDUCATION NEEDS:   No education needs have been identified at this time  Skin:  Skin Assessment: Skin Integrity Issues: Skin Integrity Issues:: Incisions Incisions: Open to R Lower Anterior leg. Skin cancer removed by MD 2 weeks ago  Last BM:  10/20/2017  Height:   Ht Readings from Last 1 Encounters:  10/20/17 5\' 4"  (1.626 m)    Weight:   Wt Readings from Last 1 Encounters:  10/20/17 121 lb 14.6 oz (55.3 kg)    Ideal Body Weight:  54.54 kg  BMI:  Body mass index is 20.93 kg/m.  Estimated Nutritional Needs:   Kcal:  1375-1650 calories  Protein:  66-83 grams (1.2-1.5g/kg)  Fluid:  >1.5L  Satira Anis. Alayza Pieper, MS, RD LDN Inpatient Clinical Dietitian Pager [phone]

## 2017-10-21 NOTE — Progress Notes (Signed)
Progressive care patient, neuro checks were done per order no acute changes, pt is neurologically intact, no acute distress noted.

## 2017-10-21 NOTE — Evaluation (Signed)
Physical Therapy Evaluation Patient Details Name: Tracy Peters MRN: [id number] DOB: 07/29/1933 Today's Date: 10/21/2017   History of Present Illness  84 y.o. 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  Clinical Impression  Pt was seen for evaluation of mobilityafterr having seizures and demonstrating elevated troponin.  Her transfers were unsteady and used RW for control of balance with another person assisting with her IV and PT controlling her portable telemetry on a pole.  Pt is in need of direction for clearing obstacles as she is in a crowded environment in her room but unable to walk far enough to leave it.  Will anticipate her return home with HHPT and will progress her gait and balance as she tolerates.  Monitor pulses and O2 sats, resting pulses today were 110 or higher, at 121 after a short walk.    Follow Up Recommendations Home health PT;Supervision for mobility/OOB    Equipment Recommendations  None recommended by PT    Recommendations for Other Services       Precautions / Restrictions Precautions Precautions: Fall Restrictions Weight Bearing Restrictions: No      Mobility  Bed Mobility Overal bed mobility: Needs Assistance Bed Mobility: Supine to Sit;Sit to Supine     Supine to sit: Min assist Sit to supine: Min assist      Transfers Overall transfer level: Needs assistance Equipment used: Rolling walker (2 wheeled);1 person hand held assist Transfers: Sit to/from Stand Sit to Stand: Min assist            Ambulation/Gait Ambulation/Gait assistance: Min assist Ambulation Distance (Feet): 20 Feet Assistive device: Rolling walker (2 wheeled);1 person hand held assist Gait Pattern/deviations: Step-through pattern;Decreased stride length;Wide base of support;Shuffle;Trunk flexed;Drifts right/left Gait velocity: reduced Gait  velocity interpretation: <1.8 ft/sec, indicate of risk for recurrent falls General Gait Details: unsteady legs, reliant of walker and controlling her balance with belt and vcs to clear obstacles  Stairs            Wheelchair Mobility    Modified Rankin (Stroke Patients Only)       Balance Overall balance assessment: Needs assistance Sitting-balance support: Feet supported;Single extremity supported Sitting balance-Leahy Scale: Fair     Standing balance support: Bilateral upper extremity supported;During functional activity Standing balance-Leahy Scale: Poor                               Pertinent Vitals/Pain Pain Assessment: No/denies pain    Home Living Family/patient expects to be discharged to:: Private residence Living Arrangements: Spouse/significant other Available Help at Discharge: Family;Available 24 hours/day Type of Home: House Home Access: Stairs to enter Entrance Stairs-Rails: Left Entrance Stairs-Number of Steps: 2 Home Layout: One level Home Equipment: Other (comment);Walker - 2 wheels;Grab bars - toilet;Grab bars - tub/shower;Hand held shower head;Shower seat      Prior Function Level of Independence: Needs assistance   Gait / Transfers Assistance Needed: used SPC mainly for gait  ADL's / Homemaking Assistance Needed: Pt had been independent with ADLs prior to last few days to which she reports requiring mod-max assist for self-care tasks        Hand Dominance   Dominant Hand: Right    Extremity/Trunk Assessment   Upper Extremity Assessment Upper Extremity Assessment: Generalized weakness    Lower Extremity Assessment Lower Extremity Assessment: Generalized weakness  Cervical / Trunk Assessment Cervical / Trunk Assessment: Kyphotic  Communication   Communication: No difficulties  Cognition Arousal/Alertness: Awake/alert Behavior During Therapy: WFL for tasks assessed/performed Overall Cognitive Status: Within  Functional Limits for tasks assessed                                        General Comments      Exercises Other Exercises Other Exercises: hips 4- and knees 4+ quads and 4- hams   Assessment/Plan    PT Assessment Patient needs continued PT services  PT Problem List Decreased strength;Decreased range of motion;Decreased activity tolerance;Decreased balance;Decreased mobility;Decreased coordination;Decreased knowledge of use of DME;Decreased safety awareness;Cardiopulmonary status limiting activity       PT Treatment Interventions DME instruction;Gait training;Stair training;Functional mobility training;Therapeutic activities;Balance training;Therapeutic exercise;Neuromuscular re-education;Patient/family education    PT Goals (Current goals can be found in the Care Plan section)  Acute Rehab PT Goals Patient Stated Goal: to go home PT Goal Formulation: With patient/family Time For Goal Achievement: 11/04/17 Potential to Achieve Goals: Good    Frequency Min 3X/week   Barriers to discharge Inaccessible home environment;Decreased caregiver support home with stairs to enter and husband there with her alone    Co-evaluation               AM-PAC PT "6 Clicks" Daily Activity  Outcome Measure Difficulty turning over in bed (including adjusting bedclothes, sheets and blankets)?: A Little Difficulty moving from lying on back to sitting on the side of the bed? : Unable Difficulty sitting down on and standing up from a chair with arms (e.g., wheelchair, bedside commode, etc,.)?: Unable Help needed moving to and from a bed to chair (including a wheelchair)?: Total Help needed walking in hospital room?: A Little Help needed climbing 3-5 steps with a railing? : Total 6 Click Score: 10    End of Session Equipment Utilized During Treatment: Gait belt Activity Tolerance: Patient limited by fatigue;Treatment limited secondary to medical complications (Comment)(ulses  and O2 sats ) Patient left: in chair;with call bell/phone within reach;with family/visitor present Nurse Communication: Mobility status PT Visit Diagnosis: Unsteadiness on feet (R26.81);Muscle weakness (generalized) (M62.81)    Time: 2426-8341 PT Time Calculation (min) (ACUTE ONLY): 28 min   Charges:   PT Evaluation $PT Eval Moderate Complexity: 1 Mod PT Treatments $Gait Training: 8-22 mins   PT G Codes:   PT G-Codes **NOT FOR INPATIENT CLASS** Functional Assessment Tool Used: AM-PAC 6 Clicks Basic Mobility    Ramond Dial 10/21/2017, 8:47 PM   Mee Hives, PT MS Acute Rehab Dept. Number: [place] [facility] [place]

## 2017-10-21 NOTE — Consult Note (Signed)
[facility] Nurse wound consult note Reason for Consult:Nonhealing traumatic wound to right anterior lower leg.  Present on admission.  Duration three weeks.   Wound type:Traumatic Pressure Injury POA: NA Measurement: 2 cm x 1.4 cm x 0.2 cm Wound bed: ruddy red Drainage (amount, consistency, odor) minimal serosanguinous   Periwound:intact, fragile skin Dressing procedure/placement/frequency:Cleanse wound to right lower leg with NS.  Apply mupirocin ointment to wound.  Cover with 4x4 gauze and wrap with kerlix/tape.  Change daily.  Will not follow at this time.  Please re-consult if needed.  Domenic Moras RN BSN [place] Pager [phone]

## 2017-10-21 NOTE — Progress Notes (Addendum)
Subjective: No complaints except for mild 6/10 bitemporal headache. States that the jaw pain is on the right and points to the location of the masseter muscle. Denies TMJ or temporal pain with chewing. Denies vision loss.   Objective: Current vital signs: BP (!) 146/66 (BP Location: Right Arm)   Pulse (!) 107   Temp 98.8 F (37.1 C) (Oral)   Resp 17   Ht '5\' 4"'  (1.626 m)   Wt 55.3 kg (121 lb 14.6 oz)   SpO2 98%   BMI 20.93 kg/m  Vital signs in last 24 hours: Temp:  [97.6 F (36.4 C)-98.8 F (37.1 C)] 98.8 F (37.1 C) (05/08 0747) Pulse Rate:  [76-107] 107 (05/08 0840) Resp:  [12-20] 17 (05/08 0840) BP: (124-182)/(62-101) 146/66 (05/08 0840) SpO2:  [95 %-100 %] 98 % (05/08 0840) Weight:  [55.3 kg (121 lb 14.6 oz)] 55.3 kg (121 lb 14.6 oz) (05/07 1426)  Intake/Output from previous day: 05/07 0701 - 05/08 0700 In: 891.7 [I.V.:791.7; IV Piggyback:100] Out: -  Intake/Output this shift: No intake/output data recorded. Nutritional status:  Diet Order           Diet regular Room service appropriate? Yes; Fluid consistency: Thin  Diet effective now         HEENT: No tenderness to palpation of temporal regions - states massage of temporalis muscles improves her headache pain.   Neurologic Exam: Ment: Intact to complex questions and commands.  CN: Visually tracks normally. Face symmetric. Phonation intact.   Lab Results: Results for orders placed or performed during the hospital encounter of 10/20/17 (from the past 48 hour(s))  CBG monitoring, ED     Status: Abnormal   Collection Time: 10/20/17 12:24 AM  Result Value Ref Range   Glucose-Capillary 115 (H) 65 - 99 mg/dL  Ethanol     Status: None   Collection Time: 10/20/17  1:05 AM  Result Value Ref Range   Alcohol, Ethyl (B) <10 <10 mg/dL    Comment:        LOWEST DETECTABLE LIMIT FOR SERUM ALCOHOL IS 10 mg/dL FOR MEDICAL PURPOSES ONLY Performed at [facility], [place] [address], [place], [place] [address]    Protime-INR     Status: Abnormal   Collection Time: 10/20/17  1:05 AM  Result Value Ref Range   Prothrombin Time 23.9 (H) 11.4 - 15.2 seconds   INR 2.15     Comment: Performed at [facility] [address], [place], [place] [address]  APTT     Status: None   Collection Time: 10/20/17  1:05 AM  Result Value Ref Range   aPTT 24 24 - 36 seconds    Comment: Performed at [facility] [address], [place], [place] [address]  CBC     Status: Abnormal   Collection Time: 10/20/17  1:05 AM  Result Value Ref Range   WBC 8.9 4.0 - 10.5 K/uL   RBC 3.32 (L) 3.87 - 5.11 MIL/uL   Hemoglobin 12.0 12.0 - 15.0 g/dL   HCT 34.8 (L) 36.0 - 46.0 %   MCV 104.8 (H) 78.0 - 100.0 fL   MCH 36.1 (H) 26.0 - 34.0 pg   MCHC 34.5 30.0 - 36.0 g/dL   RDW 13.3 11.5 - 15.5 %   Platelets 274 150 - 400 K/uL    Comment: Performed at [facility], [place] [address], [place], [place] [address]  Differential     Status: None   Collection  Time: 10/20/17  1:05 AM  Result Value Ref Range   Neutrophils Relative % 73 %   Neutro Abs 6.4 1.7 - 7.7 K/uL   Lymphocytes Relative 17 %   Lymphs Abs 1.5 0.7 - 4.0 K/uL   Monocytes Relative 10 %   Monocytes Absolute 0.9 0.1 - 1.0 K/uL   Eosinophils Relative 0 %   Eosinophils Absolute 0.0 0.0 - 0.7 K/uL   Basophils Relative 0 %   Basophils Absolute 0.0 0.0 - 0.1 K/uL    Comment: Performed at [facility] [address] [place], [place] [address]  Comprehensive metabolic panel     Status: Abnormal   Collection Time: 10/20/17  1:05 AM  Result Value Ref Range   Sodium 129 (L) 135 - 145 mmol/L   Potassium 4.3 3.5 - 5.1 mmol/L    Comment: SLIGHT HEMOLYSIS   Chloride 98 (L) 101 - 111 mmol/L   CO2 18 (L) 22 - 32 mmol/L   Glucose, Bld 112 (H) 65 - 99 mg/dL   BUN 18 6 - 20 mg/dL   Creatinine, Ser 0.84 0.44 - 1.00 mg/dL   Calcium 9.1 8.9 - 10.3 mg/dL   Total Protein 6.7 6.5 - 8.1 g/dL   Albumin 3.6 3.5 - 5.0 g/dL   AST 27 15 - 41 U/L   ALT 13 (L) 14 -  54 U/L   Alkaline Phosphatase 44 38 - 126 U/L   Total Bilirubin 0.8 0.3 - 1.2 mg/dL   GFR calc non Af Amer >60 >60 mL/min   GFR calc Af Amer >60 >60 mL/min    Comment: (NOTE) The eGFR has been calculated using the CKD EPI equation. This calculation has not been validated in all clinical situations. eGFR's persistently <60 mL/min signify possible Chronic Kidney Disease.    Anion gap 13 5 - 15    Comment: Performed at [facility] [address], [place], [place] [address]  Urine rapid drug screen (hosp performed)     Status: Abnormal   Collection Time: 10/20/17  1:06 AM  Result Value Ref Range   Opiates POSITIVE (A) NONE DETECTED   Cocaine NONE DETECTED NONE DETECTED   Benzodiazepines POSITIVE (A) NONE DETECTED   Amphetamines NONE DETECTED NONE DETECTED   Tetrahydrocannabinol NONE DETECTED NONE DETECTED   Barbiturates NONE DETECTED NONE DETECTED    Comment: (NOTE) DRUG SCREEN FOR MEDICAL PURPOSES ONLY.  IF CONFIRMATION IS NEEDED FOR ANY PURPOSE, NOTIFY LAB WITHIN 5 DAYS. LOWEST DETECTABLE LIMITS FOR URINE DRUG SCREEN Drug Class                     Cutoff (ng/mL) Amphetamine and metabolites    1000 Barbiturate and metabolites    200 Benzodiazepine                 638 Tricyclics and metabolites     300 Opiates and metabolites        300 Cocaine and metabolites        300 THC                            50 Performed at [facility], [place] [address] [place], [place] [address]   Urinalysis, Routine w reflex microscopic     Status: Abnormal   Collection Time: 10/20/17  1:06 AM  Result Value Ref Range   Color, Urine STRAW (A) YELLOW   APPearance CLEAR CLEAR  Specific Gravity, Urine 1.009 1.005 - 1.030   pH 5.0 5.0 - 8.0   Glucose, UA NEGATIVE NEGATIVE mg/dL   Hgb urine dipstick MODERATE (A) NEGATIVE   Bilirubin Urine NEGATIVE NEGATIVE   Ketones, ur NEGATIVE NEGATIVE mg/dL   Protein, ur 30 (A) NEGATIVE mg/dL   Nitrite NEGATIVE NEGATIVE   Leukocytes, UA  NEGATIVE NEGATIVE   RBC / HPF 0-5 0 - 5 RBC/hpf   WBC, UA 0-5 0 - 5 WBC/hpf   Bacteria, UA RARE (A) NONE SEEN   Hyaline Casts, UA PRESENT     Comment: Performed at [facility] [address], [place], [place] [address]  I-stat troponin, ED     Status: None   Collection Time: 10/20/17  1:11 AM  Result Value Ref Range   Troponin i, poc 0.02 0.00 - 0.08 ng/mL   Comment 3            Comment: Due to the release kinetics of cTnI, a negative result within the first hours of the onset of symptoms does not rule out myocardial infarction with certainty. If myocardial infarction is still suspected, repeat the test at appropriate intervals.   I-Stat Chem 8, ED     Status: Abnormal   Collection Time: 10/20/17  1:13 AM  Result Value Ref Range   Sodium 129 (L) 135 - 145 mmol/L   Potassium 4.1 3.5 - 5.1 mmol/L   Chloride 96 (L) 101 - 111 mmol/L   BUN 19 6 - 20 mg/dL   Creatinine, Ser 0.80 0.44 - 1.00 mg/dL   Glucose, Bld 111 (H) 65 - 99 mg/dL   Calcium, Ion 1.09 (L) 1.15 - 1.40 mmol/L   TCO2 21 (L) 22 - 32 mmol/L   Hemoglobin 12.9 12.0 - 15.0 g/dL   HCT 38.0 36.0 - 46.0 %  CBG monitoring, ED     Status: Abnormal   Collection Time: 10/20/17  2:45 AM  Result Value Ref Range   Glucose-Capillary 137 (H) 65 - 99 mg/dL  CBC     Status: Abnormal   Collection Time: 10/20/17  5:12 AM  Result Value Ref Range   WBC 9.4 4.0 - 10.5 K/uL   RBC 3.12 (L) 3.87 - 5.11 MIL/uL   Hemoglobin 11.1 (L) 12.0 - 15.0 g/dL   HCT 32.5 (L) 36.0 - 46.0 %   MCV 104.2 (H) 78.0 - 100.0 fL   MCH 35.6 (H) 26.0 - 34.0 pg   MCHC 34.2 30.0 - 36.0 g/dL   RDW 13.0 11.5 - 15.5 %   Platelets 273 150 - 400 K/uL    Comment: Performed at [facility], [address], [place], [place] [address]  Troponin I     Status: Abnormal   Collection Time: 10/20/17  5:12 AM  Result Value Ref Range   Troponin I 0.03 (HH) <0.03 ng/mL    Comment: CRITICAL RESULT CALLED TO, READ BACK BY AND VERIFIED WITH: RUTH RICHARDS,RN AT  7824 10/19/17 BY ZBEECH. Performed at [facility], [place] [address], [place], [place] [address]   Basic metabolic panel     Status: Abnormal   Collection Time: 10/20/17  5:12 AM  Result Value Ref Range   Sodium 131 (L) 135 - 145 mmol/L   Potassium 4.0 3.5 - 5.1 mmol/L   Chloride 99 (L) 101 - 111 mmol/L   CO2 22 22 - 32 mmol/L   Glucose, Bld 105 (H) 65 - 99 mg/dL   BUN 14 6 -  20 mg/dL   Creatinine, Ser 0.78 0.44 - 1.00 mg/dL   Calcium 9.2 8.9 - 10.3 mg/dL   GFR calc non Af Amer >60 >60 mL/min   GFR calc Af Amer >60 >60 mL/min    Comment: (NOTE) The eGFR has been calculated using the CKD EPI equation. This calculation has not been validated in all clinical situations. eGFR's persistently <60 mL/min signify possible Chronic Kidney Disease.    Anion gap 10 5 - 15    Comment: Performed at [facility] [address] [place], [place] [address]  TSH     Status: None   Collection Time: 10/20/17  5:12 AM  Result Value Ref Range   TSH 2.749 0.350 - 4.500 uIU/mL    Comment: Performed by a 3rd Generation assay with a functional sensitivity of <=0.01 uIU/mL. Performed at [facility], [place] [address] [place], [place] [address]   Urinalysis, Routine w reflex microscopic     Status: Abnormal   Collection Time: 10/20/17  6:30 AM  Result Value Ref Range   Color, Urine STRAW (A) YELLOW   APPearance CLEAR CLEAR   Specific Gravity, Urine 1.009 1.005 - 1.030   pH 5.0 5.0 - 8.0   Glucose, UA NEGATIVE NEGATIVE mg/dL   Hgb urine dipstick MODERATE (A) NEGATIVE   Bilirubin Urine NEGATIVE NEGATIVE   Ketones, ur NEGATIVE NEGATIVE mg/dL   Protein, ur NEGATIVE NEGATIVE mg/dL   Nitrite NEGATIVE NEGATIVE   Leukocytes, UA NEGATIVE NEGATIVE   RBC / HPF 0-5 0 - 5 RBC/hpf   WBC, UA 0-5 0 - 5 WBC/hpf   Bacteria, UA NONE SEEN NONE SEEN   Squamous Epithelial / LPF 0-5 0 - 5    Comment: Please note change in reference range. Performed at [facility], [place] [address],  [place], [place] [address]   Sodium, urine, random     Status: None   Collection Time: 10/20/17  6:30 AM  Result Value Ref Range   Sodium, Ur 51 mmol/L    Comment: Performed at [facility] [address] [place], [place] [address]  Osmolality, urine     Status: None   Collection Time: 10/20/17  6:30 AM  Result Value Ref Range   Osmolality, Ur 314 300 - 900 mOsm/kg    Comment: Performed at [facility] [address], [place], [place] [address]  CBG monitoring, ED     Status: None   Collection Time: 10/20/17  8:26 AM  Result Value Ref Range   Glucose-Capillary 99 65 - 99 mg/dL  Basic metabolic panel     Status: Abnormal   Collection Time: 10/20/17 11:09 AM  Result Value Ref Range   Sodium 132 (L) 135 - 145 mmol/L   Potassium 3.8 3.5 - 5.1 mmol/L   Chloride 100 (L) 101 - 111 mmol/L   CO2 23 22 - 32 mmol/L   Glucose, Bld 89 65 - 99 mg/dL   BUN 10 6 - 20 mg/dL   Creatinine, Ser 0.71 0.44 - 1.00 mg/dL   Calcium 9.1 8.9 - 10.3 mg/dL   GFR calc non Af Amer >60 >60 mL/min   GFR calc Af Amer >60 >60 mL/min    Comment: (NOTE) The eGFR has been calculated using the CKD EPI equation. This calculation has not been validated in all clinical situations. eGFR's persistently <60 mL/min signify possible Chronic Kidney Disease.    Anion gap 9 5 - 15    Comment:  Performed at [facility], [place] [address], [place], [place] [address]  Magnesium     Status: Abnormal   Collection Time: 10/20/17 11:09 AM  Result Value Ref Range   Magnesium 1.6 (L) 1.7 - 2.4 mg/dL    Comment: Performed at [facility] [address], [place], [place] [address]  CBG monitoring, ED     Status: None   Collection Time: 10/20/17 11:42 AM  Result Value Ref Range   Glucose-Capillary 87 65 - 99 mg/dL   Comment 1 Notify RN    Comment 2 Document in Chart   MRSA PCR Screening     Status: None   Collection Time: 10/20/17  2:28 PM  Result Value Ref Range   MRSA by PCR NEGATIVE NEGATIVE    Comment:         The GeneXpert MRSA Assay (FDA approved for NASAL specimens only), is one component of a comprehensive MRSA colonization surveillance program. It is not intended to diagnose MRSA infection nor to guide or monitor treatment for MRSA infections. Performed at [facility], [place] [address], [place], [place] [address]   Basic metabolic panel     Status: Abnormal   Collection Time: 10/20/17  2:57 PM  Result Value Ref Range   Sodium 133 (L) 135 - 145 mmol/L   Potassium 3.6 3.5 - 5.1 mmol/L   Chloride 100 (L) 101 - 111 mmol/L   CO2 26 22 - 32 mmol/L   Glucose, Bld 91 65 - 99 mg/dL   BUN 8 6 - 20 mg/dL   Creatinine, Ser 0.75 0.44 - 1.00 mg/dL   Calcium 9.4 8.9 - 10.3 mg/dL   GFR calc non Af Amer >60 >60 mL/min   GFR calc Af Amer >60 >60 mL/min    Comment: (NOTE) The eGFR has been calculated using the CKD EPI equation. This calculation has not been validated in all clinical situations. eGFR's persistently <60 mL/min signify possible Chronic Kidney Disease.    Anion gap 7 5 - 15    Comment: Performed at [facility] [address] [place], [place] [address]  Glucose, capillary     Status: None   Collection Time: 10/20/17  4:44 PM  Result Value Ref Range   Glucose-Capillary 90 65 - 99 mg/dL  Glucose, capillary     Status: Abnormal   Collection Time: 10/20/17  9:43 PM  Result Value Ref Range   Glucose-Capillary 108 (H) 65 - 99 mg/dL  CBC     Status: Abnormal   Collection Time: 10/21/17  3:39 AM  Result Value Ref Range   WBC 9.5 4.0 - 10.5 K/uL   RBC 3.09 (L) 3.87 - 5.11 MIL/uL   Hemoglobin 11.0 (L) 12.0 - 15.0 g/dL   HCT 32.7 (L) 36.0 - 46.0 %   MCV 105.8 (H) 78.0 - 100.0 fL   MCH 35.6 (H) 26.0 - 34.0 pg   MCHC 33.6 30.0 - 36.0 g/dL   RDW 13.4 11.5 - 15.5 %   Platelets 259 150 - 400 K/uL    Comment: Performed at [facility], [place] [address], [place], [place] [address]  Basic metabolic panel     Status: Abnormal   Collection Time: 10/21/17  3:39 AM   Result Value Ref Range   Sodium 135 135 - 145 mmol/L   Potassium 3.8 3.5 - 5.1 mmol/L   Chloride 104 101 - 111 mmol/L   CO2 23 22 - 32 mmol/L  Glucose, Bld 99 65 - 99 mg/dL   BUN 9 6 - 20 mg/dL   Creatinine, Ser 0.73 0.44 - 1.00 mg/dL   Calcium 8.8 (L) 8.9 - 10.3 mg/dL   GFR calc non Af Amer >60 >60 mL/min   GFR calc Af Amer >60 >60 mL/min    Comment: (NOTE) The eGFR has been calculated using the CKD EPI equation. This calculation has not been validated in all clinical situations. eGFR's persistently <60 mL/min signify possible Chronic Kidney Disease.    Anion gap 8 5 - 15    Comment: Performed at [facility] [address], [place], [place] [address]  Sedimentation rate     Status: Abnormal   Collection Time: 10/21/17  3:39 AM  Result Value Ref Range   Sed Rate 38 (H) 0 - 22 mm/hr    Comment: Performed at [facility] [address], [place], [place] [address]  C-reactive protein     Status: Abnormal   Collection Time: 10/21/17  3:39 AM  Result Value Ref Range   CRP 2.4 (H) <1.0 mg/dL    Comment: Performed at [facility] [address], [place], [place] [address]  Vitamin B12     Status: None   Collection Time: 10/21/17  3:39 AM  Result Value Ref Range   Vitamin B-12 351 180 - 914 pg/mL    Comment: (NOTE) This assay is not validated for testing neonatal or myeloproliferative syndrome specimens for Vitamin B12 levels. Performed at [facility], [place] [address] [place], [place] [address]   Folate     Status: None   Collection Time: 10/21/17  3:39 AM  Result Value Ref Range   Folate 23.0 >5.9 ng/mL    Comment: Performed at [facility] [address], [place], [place] [address]  Protime-INR     Status: Abnormal   Collection Time: 10/21/17  3:39 AM  Result Value Ref Range   Prothrombin Time 28.3 (H) 11.4 - 15.2 seconds   INR 2.68     Comment: Performed at [facility] [address] [place], [place] [address]  Glucose,  capillary     Status: None   Collection Time: 10/21/17  7:52 AM  Result Value Ref Range   Glucose-Capillary 89 65 - 99 mg/dL    Recent Results (from the past 240 hour(s))  MRSA PCR Screening     Status: None   Collection Time: 10/20/17  2:28 PM  Result Value Ref Range Status   MRSA by PCR NEGATIVE NEGATIVE Final    Comment:        The GeneXpert MRSA Assay (FDA approved for NASAL specimens only), is one component of a comprehensive MRSA colonization surveillance program. It is not intended to diagnose MRSA infection nor to guide or monitor treatment for MRSA infections. Performed at [facility], [place] [address] [place], [place] [address]     Lipid Panel No results for input(s): CHOL, TRIG, HDL, CHOLHDL, VLDL, LDLCALC in the last 72 hours.  Studies/Results: Ct Head Wo Contrast  Result Date: 10/20/2017 CLINICAL DATA:  84 year old female with dizziness and facial droop. EXAM: CT HEAD WITHOUT CONTRAST TECHNIQUE: Contiguous axial images were obtained from the base of the skull through the vertex without intravenous contrast. COMPARISON:  Head CT dated 09/30/2016 FINDINGS: Brain: There is mild age-related atrophy and chronic microvascular ischemic changes. There is no acute intracranial hemorrhage. No mass effect or midline shift. No extra-axial  fluid collection. Vascular: No hyperdense vessel or unexpected calcification. Skull: Normal. Negative for fracture or focal lesion. Sinuses/Orbits: No acute finding. Other: None IMPRESSION: 1. No acute intracranial pathology. 2. Age-related atrophy and chronic microvascular ischemic changes. Electronically Signed   By: Anner Crete M.D.   On: 10/20/2017 01:49   Mr Jeri Cos PN Contrast  Result Date: 10/20/2017 CLINICAL DATA:  Brief loss of consciousness. Confusion. Speech disturbance. EXAM: MRI HEAD WITHOUT AND WITH CONTRAST TECHNIQUE: Multiplanar, multiecho pulse sequences of the brain and surrounding structures were obtained without and  with intravenous contrast. CONTRAST:  49m MULTIHANCE GADOBENATE DIMEGLUMINE 529 MG/ML IV SOLN COMPARISON:  CT 10/20/2017.  MRI 10/12/2013. FINDINGS: Brain: Diffusion imaging does not show any acute or subacute infarction. Brainstem and cerebellum are normal. Cerebral hemispheres show age related atrophy with moderate chronic small-vessel ischemic changes throughout the deep and subcortical white matter. No cortical or large vessel territory infarction. No mass lesion, hemorrhage, hydrocephalus or extra-axial collection. After contrast administration, no abnormal enhancement occurs. Vascular: Major vessels at the base of the brain show flow. Skull and upper cervical spine: Negative Sinuses/Orbits: Clear/normal Other: None IMPRESSION: No acute or reversible finding. Age related atrophy. Moderate chronic small-vessel ischemic changes of the cerebral hemispheric white matter. Electronically Signed   By: MNelson ChimesM.D.   On: 10/20/2017 13:16   Dg Chest Port 1 View  Result Date: 10/20/2017 CLINICAL DATA:  84y/o  F; seizure. EXAM: PORTABLE CHEST 1 VIEW COMPARISON:  04/17/2016 chest radiograph FINDINGS: Stable normal cardiac silhouette given projection and technique. Aortic atherosclerosis with calcification. Clear lungs. No pleural effusion or pneumothorax. No acute osseous abnormality is evident. Partially visualized anterior cervical fusion hardware. IMPRESSION: No active disease. Electronically Signed   By: LKristine GarbeM.D.   On: 10/20/2017 06:21   Dg Hips Bilat W Or Wo Pelvis 5 Views  Result Date: 10/20/2017 CLINICAL DATA:  84year old female with fall. EXAM: DG HIP (WITH OR WITHOUT PELVIS) 5+V BILAT COMPARISON:  None. FINDINGS: There is no acute fracture or dislocation. The bones are osteopenic. There is a total right hip arthroplasty. The arthroplasty components appear intact and in anatomic alignment. The soft tissues are grossly unremarkable. IMPRESSION: No acute fracture or dislocation.  Electronically Signed   By: AAnner CreteM.D.   On: 10/20/2017 03:00    Medications:  Scheduled: . losartan  100 mg Oral Daily  . Warfarin - Pharmacist Dosing Inpatient   Does not apply q1800   Continuous: . sodium chloride 75 mL/hr at 10/21/17 0651  . 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  1. She was started on Keppra without further seizures but feels that it is overly sedating.  2. EEG was negative. 3. MRI Brain w/wo contrast: No acute or reversible finding. Age related atrophy. Moderate chronic small-vessel ischemic changes of the cerebral hemispheric white matter. 4. Overall clinical picture not consistent with temporal arteritis. Masseter muscle pain is most likely secondary to extended jaw clenching during her recent seizure.   Partial Seizures - Switch Keppra to Depakote. Will load Depakote 20 mg/kg, then start scheduled dosing at 5 mg/kg TID.   - Limit pain medications - Seizure precautions - No driving x 6 months - Check for underlying UTI/electrolyte abnormalities - MRA of head and neck cancelled - Discussed with Dr. RTana Coast  LOS: 0 days   '@Electronically'  signed: Dr. Kerney Elbe 10/21/2017  9:35 AM

## 2017-10-21 NOTE — Progress Notes (Signed)
[facility] for Warfarin Indication: Hx of DVT, Hx of arterial thrombus  Patient Measurements: Height: 5\' 4"  (162.6 cm) Weight: 121 lb 14.6 oz (55.3 kg) IBW/kg (Calculated) : 54.7  Vital Signs: Temp: 98.8 F (37.1 C) (05/08 0747) Temp Source: Oral (05/08 0747) BP: 146/66 (05/08 0840) Pulse Rate: 107 (05/08 0840)  Labs: Recent Labs    10/20/17 0105 10/20/17 0113 10/20/17 0512 10/20/17 1109 10/20/17 1457 10/21/17 0339  HGB 12.0 12.9 11.1*  --   --  11.0*  HCT 34.8* 38.0 32.5*  --   --  32.7*  PLT 274  --  273  --   --  259  APTT 24  --   --   --   --   --   LABPROT 23.9*  --   --   --   --  28.3*  INR 2.15  --   --   --   --  2.68  CREATININE 0.84 0.80 0.78 0.71 0.75 0.73  TROPONINI  --   --  0.03*  --   --   --     Estimated Creatinine Clearance: 46.8 mL/min (by C-G formula based on SCr of 0.73 mg/dL).   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 The initial plan was to hold warfarin and bridge with Heparin when INR<2 due to patient NPO. However, patient now with diet ordered and warfarin resumed 5/7 PM. INR 2.15 on admit, trended up to 2.68 after warfarin resumed 5/7 - this is likely due to DDI with newly started Valproate (Neuro switched from [place]). Text-paged to inform MD will need close INR f/u on discharge.  Heparin consult d/c'd (never started) with therapeutic INR. CBC stable. No bleed documented.  PTA dose: 7.5 mg/day EXCEPT for 10 mg two days a week ([facility] clinic lists Mon/Fri, PTA med list states Tues/Fri)  Goal of Therapy:  INR 2-3 Monitor platelets by anticoagulation protocol: Yes   Plan:  Warfarin 5mg  x 1 dose at 1800 - reduced dose on new Valproate, f/u INR trend and may need to further reduce if INR continues to bump Monitor daily INR, CBC, s/sx bleeding  Elicia Lamp, PharmD, BCPS Clinical Pharmacist Clinical phone for  10/21/2017 until 3:30pm: [phone] If after 3:30pm, please call main pharmacy at: [phone] 10/21/2017 11:08 AM

## 2017-10-21 NOTE — Progress Notes (Signed)
[facility]                                                                              Patient Demographics  Tracy Peters, is a 84 y.o. female, DOB - 11/19/1933, UXN:[id number]  Admit date - 10/20/2017   Admitting Physician Eduard Clos, MD  Outpatient Primary MD for the patient is Plotnikov, Georgina Quint, MD  Outpatient specialists:   LOS - 0  days   Medical records reviewed and are as summarized below:    Chief Complaint  Patient presents with  . Seizures  . Dizziness  . 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  As per the husband, patient had been feeling increasingly irritable for the last 2 days PTA.  She had taken 1 dose of temazepam to sleep, that was the first time patient had taken it.  Patient is also on hydrocodone for recent hip surgery.  On the evening of admission, patient suddenly started having shaking of the right upper extremity and was not talking correctly and trying to take some medications involuntarily.  Patient was brought to ED, had another seizure-like activity in ED.  She was given IV Keppra loading dose and admitted for further work-up.   Assessment & Plan    Principal Problem:   Seizures (HCC), new onset, and generalized tonic-clonic, witnessed -No further seizures, patient received IV Keppra however subsequently was noted to be much more drowsy and lethargic. -EEG negative, MRI brain with and without contrast, showed no acute or reversible findings, age-related atrophy with moderate chronic small vessel ischemic changes -Keppra discontinued, placed on Depakote, monitor for another 24 hours -Seizure precautions, no driving for 6 months  Right sided jaw pain with chewing -Swallow evaluation recommended regular thin liquids, no  aspiration, concern for trigeminal  neuralgia, complaining of right jaw pain on chewing -Discussed with neurology, Dr. Otelia Limes, do not think it is temporal arteritis.  Likely masseter muscle pain secondary to extended jaw clenching during the recent seizure, counseled MRA of the head and neck  -ESR CRP only mildly up, possibly age-related and seizures, not consistent with acute temporal arteritis. -UA negative for UTI      Essential hypertension -BP currently stable, continue IV hydralazine as needed    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HCC) -Patient is on Coumadin outpatient, therapeutic  History of iron deficiency -Continue iron supplements  Mild hyponatremia -Sodium 129 at the time of admission, possibly due to dehydration, #1, improved, 135  Right lower extremity wound -Nonhealing wound to the right anterior lower leg, worsened when she fell with a seizure -Wound care consult placed for dressing daily  Code Status: Full code DVT Prophylaxis: Warfarin Family Communication: Discussed in detail with the patient, all imaging results, lab results explained to the patient, husband and daughter   Disposition Plan: Possible DC home in a.m. if tolerating Depakote  Time Spent in minutes 25 minutes  Procedures:  EEG  Consultants:  Neurology  Antimicrobials:      Medications  Scheduled Meds: . fluticasone  1 spray Each Nare Daily  . losartan  100 mg Oral Daily  . mupirocin ointment   Topical Daily  . warfarin  5 mg Oral ONCE-1800  . Warfarin - Pharmacist Dosing Inpatient   Does not apply q1800   Continuous Infusions: . sodium chloride 75 mL/hr at 10/21/17 0651  . valproate sodium     PRN Meds:.acetaminophen **OR** acetaminophen (TYLENOL) oral liquid 160 mg/5 mL **OR** acetaminophen, hydrALAZINE, LORazepam   Antibiotics   Anti-infectives (From admission, onward)   None        Subjective:   Tracy Peters was seen and examined today.  No new seizures,  feeling better, BP somewhat uncontrolled.  Patient denies dizziness, chest pain, shortness of breath, abdominal pain, N/V/D/C, new weakness, numbess, tingling. No acute events overnight.    Objective:   Vitals:   10/21/17 0643 10/21/17 0747 10/21/17 0840 10/21/17 1212  BP: (!) 175/81 (!) 182/101 (!) 146/66 (!) 147/75  Pulse: 88 98 (!) 107 (!) 101  Resp: 15 20 17 14   Temp: 97.7 F (36.5 C) 98.8 F (37.1 C)  98.8 F (37.1 C)  TempSrc:  Oral  Oral  SpO2: 97% 97% 98% 98%  Weight:      Height:        Intake/Output Summary (Last 24 hours) at 10/21/2017 1426 Last data filed at 10/21/2017 1217 Gross per 24 hour  Intake 1021.67 ml  Output -  Net 1021.67 ml     Wt Readings from Last 3 Encounters:  10/20/17 55.3 kg (121 lb 14.6 oz)  08/23/17 54.4 kg (120 lb)  07/29/17 56.7 kg (125 lb)     Exam  General: Alert and oriented x 3, NAD  Eyes:   HEENT:    Cardiovascular: S1 S2 auscultated,  3/6 SEM RUSB Regular rate and rhythm.  Respiratory: Clear to auscultation bilaterally, no wheezing, rales or rhonchi  Gastrointestinal: Soft, nontender, nondistended, + bowel sounds  Ext: no pedal edema bilaterally  Neuro: no new deficits  Musculoskeletal: No digital cyanosis, clubbing  Skin: No rashes  Psych: Normal affect and demeanor, alert and oriented x3    Data Reviewed:  I have personally reviewed following labs and imaging studies  Micro Results Recent Results (from the past 240 hour(s))  MRSA PCR Screening     Status: None   Collection Time: 10/20/17  2:28 PM  Result Value Ref Range Status   MRSA by PCR NEGATIVE NEGATIVE Final    Comment:        The GeneXpert MRSA Assay (FDA approved for NASAL specimens only), is one component of a comprehensive MRSA colonization surveillance program. It is not intended to diagnose MRSA infection nor to guide or monitor treatment for MRSA infections. Performed at [facility], [address] [place], [place] [address]      Radiology Reports Ct Head Wo Contrast  Result Date: 10/20/2017 CLINICAL DATA:  84 year old female with dizziness and facial droop. EXAM: CT HEAD WITHOUT CONTRAST TECHNIQUE: Contiguous axial images were obtained from the base of the skull through the vertex without intravenous contrast. COMPARISON:  Head CT dated 09/30/2016 FINDINGS: Brain: There is mild age-related atrophy and chronic microvascular ischemic changes. There is no acute intracranial hemorrhage. No mass effect or midline shift. No extra-axial fluid collection. Vascular: No hyperdense vessel or unexpected calcification. Skull: Normal. Negative for fracture or focal lesion. Sinuses/Orbits: No acute finding. Other: None IMPRESSION: 1. No acute  intracranial pathology. 2. Age-related atrophy and chronic microvascular ischemic changes. Electronically Signed   By: Elgie Collard M.D.   On: 10/20/2017 01:49   Mr Laqueta Jean WU Contrast  Result Date: 10/20/2017 CLINICAL DATA:  Brief loss of consciousness. Confusion. Speech disturbance. EXAM: MRI HEAD WITHOUT AND WITH CONTRAST TECHNIQUE: Multiplanar, multiecho pulse sequences of the brain and surrounding structures were obtained without and with intravenous contrast. CONTRAST:  12mL MULTIHANCE GADOBENATE DIMEGLUMINE 529 MG/ML IV SOLN COMPARISON:  CT 10/20/2017.  MRI 10/12/2013. FINDINGS: Brain: Diffusion imaging does not show any acute or subacute infarction. Brainstem and cerebellum are normal. Cerebral hemispheres show age related atrophy with moderate chronic small-vessel ischemic changes throughout the deep and subcortical white matter. No cortical or large vessel territory infarction. No mass lesion, hemorrhage, hydrocephalus or extra-axial collection. After contrast administration, no abnormal enhancement occurs. Vascular: Major vessels at the base of the brain show flow. Skull and upper cervical spine: Negative Sinuses/Orbits: Clear/normal Other: None IMPRESSION: No acute or reversible finding.  Age related atrophy. Moderate chronic small-vessel ischemic changes of the cerebral hemispheric white matter. Electronically Signed   By: Paulina Fusi M.D.   On: 10/20/2017 13:16   Dg Chest Port 1 View  Result Date: 10/20/2017 CLINICAL DATA:  84 y/o  F; seizure. EXAM: PORTABLE CHEST 1 VIEW COMPARISON:  04/17/2016 chest radiograph FINDINGS: Stable normal cardiac silhouette given projection and technique. Aortic atherosclerosis with calcification. Clear lungs. No pleural effusion or pneumothorax. No acute osseous abnormality is evident. Partially visualized anterior cervical fusion hardware. IMPRESSION: No active disease. Electronically Signed   By: Mitzi Hansen M.D.   On: 10/20/2017 06:21   Dg Hips Bilat W Or Wo Pelvis 5 Views  Result Date: 10/20/2017 CLINICAL DATA:  84 year old female with fall. EXAM: DG HIP (WITH OR WITHOUT PELVIS) 5+V BILAT COMPARISON:  None. FINDINGS: There is no acute fracture or dislocation. The bones are osteopenic. There is a total right hip arthroplasty. The arthroplasty components appear intact and in anatomic alignment. The soft tissues are grossly unremarkable. IMPRESSION: No acute fracture or dislocation. Electronically Signed   By: Elgie Collard M.D.   On: 10/20/2017 03:00    Lab Data:  CBC: Recent Labs  Lab 10/20/17 0105 10/20/17 0113 10/20/17 0512 10/21/17 0339  WBC 8.9  --  9.4 9.5  NEUTROABS 6.4  --   --   --   HGB 12.0 12.9 11.1* 11.0*  HCT 34.8* 38.0 32.5* 32.7*  MCV 104.8*  --  104.2* 105.8*  PLT 274  --  273 259   Basic Metabolic Panel: Recent Labs  Lab 10/20/17 0105 10/20/17 0113 10/20/17 0512 10/20/17 1109 10/20/17 1457 10/21/17 0339  NA 129* 129* 131* 132* 133* 135  K 4.3 4.1 4.0 3.8 3.6 3.8  CL 98* 96* 99* 100* 100* 104  CO2 18*  --  22 23 26 23   GLUCOSE 112* 111* 105* 89 91 99  BUN 18 19 14 10 8 9   CREATININE 0.84 0.80 0.78 0.71 0.75 0.73  CALCIUM 9.1  --  9.2 9.1 9.4 8.8*  MG  --   --   --  1.6*  --   --     GFR: Estimated Creatinine Clearance: 46.8 mL/min (by C-G formula based on SCr of 0.73 mg/dL). Liver Function Tests: Recent Labs  Lab 10/20/17 0105  AST 27  ALT 13*  ALKPHOS 44  BILITOT 0.8  PROT 6.7  ALBUMIN 3.6   No results for input(s): LIPASE, AMYLASE in the last 168 hours.  No results for input(s): AMMONIA in the last 168 hours. Coagulation Profile: Recent Labs  Lab 10/20/17 0105 10/21/17 0339  INR 2.15 2.68   Cardiac Enzymes: Recent Labs  Lab 10/20/17 0512  TROPONINI 0.03*   BNP (last 3 results) No results for input(s): PROBNP in the last 8760 hours. HbA1C: No results for input(s): HGBA1C in the last 72 hours. CBG: Recent Labs  Lab 10/20/17 1142 10/20/17 1644 10/20/17 2143 10/21/17 0752 10/21/17 1208  GLUCAP 87 90 108* 89 99   Lipid Profile: No results for input(s): CHOL, HDL, LDLCALC, TRIG, CHOLHDL, LDLDIRECT in the last 72 hours. Thyroid Function Tests: Recent Labs    10/20/17 0512  TSH 2.749   Anemia Panel: Recent Labs    10/21/17 0339  VITAMINB12 351  FOLATE 23.0   Urine analysis:    Component Value Date/Time   COLORURINE STRAW (A) 10/20/2017 0630   APPEARANCEUR CLEAR 10/20/2017 0630   LABSPEC 1.009 10/20/2017 0630   PHURINE 5.0 10/20/2017 0630   GLUCOSEU NEGATIVE 10/20/2017 0630   GLUCOSEU NEGATIVE 01/03/2016 1043   HGBUR MODERATE (A) 10/20/2017 0630   BILIRUBINUR NEGATIVE 10/20/2017 0630   BILIRUBINUR neg 06/15/2015 1110   KETONESUR NEGATIVE 10/20/2017 0630   PROTEINUR NEGATIVE 10/20/2017 0630   UROBILINOGEN 0.2 01/03/2016 1043   NITRITE NEGATIVE 10/20/2017 0630   LEUKOCYTESUR NEGATIVE 10/20/2017 0630     Tacori Kvamme M.D. [facility] 10/21/2017, 2:26 PM  Pager: [phone] Between 7am to 7pm - call Pager - [phone]  After 7pm go to [email or web address] - password [email or web address]  Call night coverage person covering after 7pm

## 2017-10-22 ENCOUNTER — Observation Stay (HOSPITAL_COMMUNITY): Payer: 59

## 2017-10-22 DIAGNOSIS — I10 Essential (primary) hypertension: Secondary | ICD-10-CM | POA: Diagnosis not present

## 2017-10-22 DIAGNOSIS — R52 Pain, unspecified: Secondary | ICD-10-CM

## 2017-10-22 DIAGNOSIS — D6859 Other primary thrombophilia: Secondary | ICD-10-CM | POA: Diagnosis not present

## 2017-10-22 DIAGNOSIS — R569 Unspecified convulsions: Secondary | ICD-10-CM | POA: Diagnosis not present

## 2017-10-22 LAB — GLUCOSE, CAPILLARY
GLUCOSE-CAPILLARY: 102 mg/dL — AB (ref 65–99)
GLUCOSE-CAPILLARY: 107 mg/dL — AB (ref 65–99)
Glucose-Capillary: 77 mg/dL (ref 65–99)
Glucose-Capillary: 106 mg/dL — ABNORMAL HIGH (ref 65–99)

## 2017-10-22 LAB — URIC ACID: Uric Acid, Serum: 3.6 mg/dL (ref 2.3–6.6)

## 2017-10-22 LAB — AMMONIA: Ammonia: 74 umol/L — ABNORMAL HIGH (ref 9–35)

## 2017-10-22 LAB — PROTIME-INR
INR: 3.4
Prothrombin Time: 34.1 seconds — ABNORMAL HIGH (ref 11.4–15.2)

## 2017-10-22 MED ORDER — DIVALPROEX SODIUM 250 MG PO DR TAB: 250.0000 mg | DELAYED_RELEASE_TABLET | Freq: Three times a day (TID) | ORAL | 3 refills | Status: DC

## 2017-10-22 MED ORDER — ZOLPIDEM TARTRATE 10 MG PO TABS: ORAL_TABLET | ORAL | 1 refills | Status: DC

## 2017-10-22 MED ORDER — DIVALPROEX SODIUM 250 MG PO DR TAB
250.0000 mg | DELAYED_RELEASE_TABLET | Freq: Three times a day (TID) | ORAL | Status: DC
  Filled 2017-10-22: qty 1

## 2017-10-22 MED ORDER — ZOLPIDEM TARTRATE 10 MG PO TABS: 10.0000 mg | ORAL_TABLET | Freq: Every evening | ORAL | 0 refills | Status: DC | PRN

## 2017-10-22 MED ORDER — DOCUSATE SODIUM 100 MG PO CAPS: 100.0000 mg | ORAL_CAPSULE | Freq: Every day | ORAL | Status: DC | PRN

## 2017-10-22 MED ORDER — WARFARIN SODIUM 5 MG PO TABS: 5.0000 mg | ORAL_TABLET | Freq: Every day | ORAL | 3 refills | Status: DC

## 2017-10-22 MED ORDER — MUPIROCIN 2 % EX OINT: TOPICAL_OINTMENT | Freq: Every day | CUTANEOUS | 3 refills | Status: DC

## 2017-10-22 MED ORDER — WARFARIN SODIUM 5 MG PO TABS: 5.0000 mg | ORAL_TABLET | Freq: Every day | ORAL | 0 refills | Status: DC

## 2017-10-22 NOTE — Progress Notes (Addendum)
Patient is complaining of pain in her left ankle. She and her husband asked if somebody can check on her. MD was notified.

## 2017-10-22 NOTE — Discharge Summary (Signed)
Physician Discharge Summary   Patient ID: Tracy Peters MRN: [id number] DOB/AGE: November 19, 1933 84 y.o.  Admit date: 10/20/2017 Discharge date: 10/22/2017  Primary Care Physician:  Cassandria Anger, MD   Recommendations for Outpatient Follow-up:  1. Follow up with PCP in 1-2 weeks 2. Please obtain BMP/CBC in one week  Home Health: None Equipment/Devices: none   Discharge Condition: stable CODE STATUS: FULL  Diet recommendation: Heart healthy diet   Discharge Diagnoses:    New onset seizure, generalized tonic-clonic   Right-sided jaw pain . 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[place]) . Primary hypercoagulable state ([place]) [D68.59] . Essential hypertension Right lower extremity wound   Consults: Neurology    Allergies:   Allergies  Allergen Reactions  . Bee Venom Anaphylaxis and Other (See Comments)    Yellow jackets, wasps as well  . Ivp Dye [Iodinated Diagnostic Agents] Anaphylaxis  . Shellfish Allergy Anaphylaxis  . Temazepam Other (See Comments)    Severe Hallucinations and Paranoia  . Amlodipine Besylate Swelling  . Aspirin Other (See Comments)    REACTION: unspecified  . Atorvastatin Diarrhea  . Cholestyramine Other (See Comments)    REACTION: mouth irritation  . Codeine Phosphate Nausea And Vomiting  . Demerol [Meperidine] Other (See Comments)    Severe Hallucination!!!!  . Diphenhydramine Hcl Other (See Comments)    hyperactive  . Doxycycline Nausea Only  . Gentamicin Sulfate Other (See Comments)    REACTION: unspecified  . Meclizine Hcl Other (See Comments)    REACTION: more dizziness on it  . Meperidine Hcl Other (See Comments)    Unknown  . Metronidazole Other (See Comments)    REACTION: bad taste  . Moxifloxacin Other (See Comments)    REACTION: insomnia Able to use Cipro  . Sulfamethoxazole Other (See Comments)    Kidney problems  . Penicillins Swelling, Rash and Other (See Comments)    Has patient had a PCN reaction causing immediate rash,  facial/tongue/throat swelling, SOB or lightheadedness with hypotension: Yes Has patient had a PCN reaction causing severe rash involving mucus membranes or skin necrosis: No Has patient had a PCN reaction that required hospitalization: No Has patient had a PCN reaction occurring within the last 10 years: No If all of the above answers are "NO", then may proceed with Cephalosporin use.      DISCHARGE MEDICATIONS: Allergies as of 10/22/2017      Reactions   Bee Venom Anaphylaxis, Other (See Comments)   Yellow jackets, wasps as well   Ivp Dye [iodinated Diagnostic Agents] Anaphylaxis   Shellfish Allergy Anaphylaxis   Temazepam Other (See Comments)   Severe Hallucinations and Paranoia   Amlodipine Besylate Swelling   Aspirin Other (See Comments)   REACTION: unspecified   Atorvastatin Diarrhea   Cholestyramine Other (See Comments)   REACTION: mouth irritation   Codeine Phosphate Nausea And Vomiting   Demerol [meperidine] Other (See Comments)   Severe Hallucination!!!!   Diphenhydramine Hcl Other (See Comments)   hyperactive   Doxycycline Nausea Only   Gentamicin Sulfate Other (See Comments)   REACTION: unspecified   Meclizine Hcl Other (See Comments)   REACTION: more dizziness on it   Meperidine Hcl Other (See Comments)   Unknown   Metronidazole Other (See Comments)   REACTION: bad taste   Moxifloxacin Other (See Comments)   REACTION: insomnia Able to use Cipro   Sulfamethoxazole Other (See Comments)   Kidney problems   Penicillins Swelling, Rash, Other (See Comments)   Has patient had a PCN reaction  causing immediate rash, facial/tongue/throat swelling, SOB or lightheadedness with hypotension: Yes Has patient had a PCN reaction causing severe rash involving mucus membranes or skin necrosis: No Has patient had a PCN reaction that required hospitalization: No Has patient had a PCN reaction occurring within the last 10 years: No If all of the above answers are "NO", then may  proceed with Cephalosporin use.      Medication List    STOP taking these medications   HYDROcodone-acetaminophen 5-325 MG tablet Commonly known as:  NORCO/VICODIN     TAKE these medications   divalproex 250 MG DR tablet Commonly known as:  DEPAKOTE Take 1 tablet (250 mg total) by mouth 3 (three) times daily.   docusate sodium 100 MG capsule Commonly known as:  COLACE Take 1 capsule (100 mg total) by mouth daily as needed for mild constipation.   EPINEPHrine 0.3 mg/0.3 mL Soaj injection Commonly known as:  EPI-PEN As dirrected What changed:    how much to take  how to take this  when to take this  additional instructions   ferrous sulfate 325 (65 FE) MG tablet Take 1 tablet (325 mg total) by mouth 3 (three) times daily after meals. What changed:  when to take this   fish oil-omega-3 fatty acids 1000 MG capsule Take 2 g by mouth daily.   fluticasone 50 MCG/ACT nasal spray Commonly known as:  FLONASE Place 2 sprays into both nostrils daily. What changed:    when to take this  reasons to take this   losartan 100 MG tablet Commonly known as:  COZAAR Take 1 tablet (100 mg total) by mouth daily.   mupirocin ointment 2 % Commonly known as:  BACTROBAN Apply topically daily. Cleanse wound to right lower leg with NS.  Apply mupirocin ointment to wound.  Cover with 4x4 gauze and wrap with kerlix/tape.  Change daily. Start taking on:  10/23/2017   valACYclovir 1000 MG tablet Commonly known as:  VALTREX Take 1 tablet (1,000 mg total) by mouth 2 (two) times daily.   warfarin 5 MG tablet Commonly known as:  COUMADIN Take as directed. If you are unsure how to take this medication, talk to your nurse or doctor. Original instructions:  Take 1 tablet (5 mg total) by mouth daily at 2 PM. HOLD warfarin today (10/22/17), if INR less than 3, start warfarin 5m daily on 5/10. Start taking on:  10/23/2017 What changed:    how much to take  how to take this  when to take  this  additional instructions   zolpidem 10 MG tablet Commonly known as:  AMBIEN Take 1 tablet (10 mg total) by mouth at bedtime as needed for sleep. What changed:    how much to take  how to take this  when to take this  reasons to take this  additional instructions        Brief H and P: For complete details please refer to admission H and P, but in brief Patient is a 84year old female with history ofright radial artery thrombosis on Coumadin, iron deficiency anemia, hypertension was brought to the ER after patient had a seizure-like episode. As per the husband, patient had been feeling increasingly irritable for the last 2 days PTA.  She had taken 1 dose of temazepam to sleep, that was the first time patient had taken it. Patient is also on hydrocodone for recent hip surgery.  On the evening of admission, patient suddenly started having shaking of the right upper  extremity and was not talking correctly and trying to take some medications involuntarily.  Patient was brought to ED, had another seizure-like activity in ED.  She was given IV Keppra loading dose and admitted for further work-up.   Hospital Course:  Seizures [facility]), new onset, and generalized tonic-clonic, witnessed -No further seizures, patient received IV Keppra however subsequently was noted to be much more drowsy and lethargic. -EEG negative, MRI brain with and without contrast, showed no acute or reversible findings, age-related atrophy with moderate chronic small vessel ischemic changes -Keppra was discontinued, placed on Depakote, tolerated well -Seizure precautions, no driving for 6 months, instructions placed in the ABS  Right sided jaw pain with chewing -Swallow evaluation recommended regular thin liquids, no aspiration, concern for trigeminal  neuralgia, complaining of right jaw pain on chewing -Discussed with neurology, Dr. Cheral Marker, do not think it is temporal arteritis.  Likely masseter muscle pain  secondary to extended jaw clenching during the recent seizure -ESR CRP only mildly up, possibly age-related and seizures, not consistent with acute temporal arteritis. -UA negative for UTI      Essential hypertension -BP currently stable, continue losartan    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[place]) -Patient is on Coumadin outpatient, INR 3.4 at the time of discharge.  Patient instructed to hold Coumadin today.  Repeat INR in a.m., if less than 3, start low-dose of Coumadin 5 mg daily.  History of iron deficiency -Continue iron supplements  Mild hyponatremia -Sodium 129 at the time of admission, possibly due to dehydration, #1, improved, 135  Right lower extremity wound -Nonhealing wound to the right anterior lower leg, worsened when she fell with a seizure -Wound care consult was placed for dressing daily  Left ankle pain -Possibly due to sprain, left ankle x-ray negative for any fracture dislocation  Day of Discharge S: No complaints earlier in the morning, seen twice, subsequently complained of pain in the left ankle.  BP (!) 154/65 (BP Location: Right Arm)   Pulse 95   Temp 98.6 F (37 C) (Oral)   Resp 17   Ht '5\' 4"'  (1.626 m)   Wt 55.3 kg (121 lb 14.6 oz)   SpO2 100%   BMI 20.93 kg/m   Physical Exam: General: Alert and awake oriented x3 not in any acute distress. HEENT: anicteric sclera, pupils reactive to light and accommodation CVS: S1-S2 clear no murmur rubs or gallops Chest: clear to auscultation bilaterally, no wheezing rales or rhonchi Abdomen: soft nontender, nondistended, normal bowel sounds Extremities: no cyanosis, clubbing or edema noted bilaterally, nonhealing wound on theRLE Neuro: Cranial nerves II-XII intact, no focal neurological deficits   The results of significant diagnostics from this hospitalization (including imaging, microbiology, ancillary and laboratory) are listed below for reference.      Procedures/Studies:  Dg Ankle Complete  Left  Result Date: 10/22/2017 CLINICAL DATA:  Recent fall following seizure activity, initial encounter EXAM: LEFT ANKLE COMPLETE - 3+ VIEW COMPARISON:  None. FINDINGS: Some well corticated bony densities are noted along lateral malleolus distally likely related to prior trauma. No acute fracture or dislocation is seen. Small calcaneal spurs are noted. Similar well corticated densities are noted adjacent to the first cuneiform medially. IMPRESSION: Chronic changes without acute abnormality Electronically Signed   By: Inez Catalina M.D.   On: 10/22/2017 12:23   Ct Head Wo Contrast  Result Date: 10/20/2017 CLINICAL DATA:  84 year old female with dizziness and facial droop. EXAM: CT HEAD WITHOUT CONTRAST TECHNIQUE: Contiguous axial images were obtained from  the base of the skull through the vertex without intravenous contrast. COMPARISON:  Head CT dated 09/30/2016 FINDINGS: Brain: There is mild age-related atrophy and chronic microvascular ischemic changes. There is no acute intracranial hemorrhage. No mass effect or midline shift. No extra-axial fluid collection. Vascular: No hyperdense vessel or unexpected calcification. Skull: Normal. Negative for fracture or focal lesion. Sinuses/Orbits: No acute finding. Other: None IMPRESSION: 1. No acute intracranial pathology. 2. Age-related atrophy and chronic microvascular ischemic changes. Electronically Signed   By: Anner Crete M.D.   On: 10/20/2017 01:49   Mr Jeri Cos IO Contrast  Result Date: 10/20/2017 CLINICAL DATA:  Brief loss of consciousness. Confusion. Speech disturbance. EXAM: MRI HEAD WITHOUT AND WITH CONTRAST TECHNIQUE: Multiplanar, multiecho pulse sequences of the brain and surrounding structures were obtained without and with intravenous contrast. CONTRAST:  19m MULTIHANCE GADOBENATE DIMEGLUMINE 529 MG/ML IV SOLN COMPARISON:  CT 10/20/2017.  MRI 10/12/2013. FINDINGS: Brain: Diffusion imaging does not show any acute or subacute infarction. Brainstem  and cerebellum are normal. Cerebral hemispheres show age related atrophy with moderate chronic small-vessel ischemic changes throughout the deep and subcortical white matter. No cortical or large vessel territory infarction. No mass lesion, hemorrhage, hydrocephalus or extra-axial collection. After contrast administration, no abnormal enhancement occurs. Vascular: Major vessels at the base of the brain show flow. Skull and upper cervical spine: Negative Sinuses/Orbits: Clear/normal Other: None IMPRESSION: No acute or reversible finding. Age related atrophy. Moderate chronic small-vessel ischemic changes of the cerebral hemispheric white matter. Electronically Signed   By: MNelson ChimesM.D.   On: 10/20/2017 13:16   Dg Chest Port 1 View  Result Date: 10/20/2017 CLINICAL DATA:  84y/o  F; seizure. EXAM: PORTABLE CHEST 1 VIEW COMPARISON:  04/17/2016 chest radiograph FINDINGS: Stable normal cardiac silhouette given projection and technique. Aortic atherosclerosis with calcification. Clear lungs. No pleural effusion or pneumothorax. No acute osseous abnormality is evident. Partially visualized anterior cervical fusion hardware. IMPRESSION: No active disease. Electronically Signed   By: LKristine GarbeM.D.   On: 10/20/2017 06:21   Dg Hips Bilat W Or Wo Pelvis 5 Views  Result Date: 10/20/2017 CLINICAL DATA:  84year old female with fall. EXAM: DG HIP (WITH OR WITHOUT PELVIS) 5+V BILAT COMPARISON:  None. FINDINGS: There is no acute fracture or dislocation. The bones are osteopenic. There is a total right hip arthroplasty. The arthroplasty components appear intact and in anatomic alignment. The soft tissues are grossly unremarkable. IMPRESSION: No acute fracture or dislocation. Electronically Signed   By: AAnner CreteM.D.   On: 10/20/2017 03:00       LAB RESULTS: Basic Metabolic Panel: Recent Labs  Lab 10/20/17 1109 10/20/17 1457 10/21/17 0339  NA 132* 133* 135  K 3.8 3.6 3.8  CL 100* 100*  104  CO2 '23 26 23  ' GLUCOSE 89 91 99  BUN '10 8 9  ' CREATININE 0.71 0.75 0.73  CALCIUM 9.1 9.4 8.8*  MG 1.6*  --   --    Liver Function Tests: Recent Labs  Lab 10/20/17 0105  AST 27  ALT 13*  ALKPHOS 44  BILITOT 0.8  PROT 6.7  ALBUMIN 3.6   No results for input(s): LIPASE, AMYLASE in the last 168 hours. Recent Labs  Lab 10/22/17 0955  AMMONIA 74*   CBC: Recent Labs  Lab 10/20/17 0105  10/20/17 0512 10/21/17 0339  WBC 8.9  --  9.4 9.5  NEUTROABS 6.4  --   --   --   HGB 12.0   < >  11.1* 11.0*  HCT 34.8*   < > 32.5* 32.7*  MCV 104.8*  --  104.2* 105.8*  PLT 274  --  273 259   < > = values in this interval not displayed.   Cardiac Enzymes: Recent Labs  Lab 10/20/17 0512  TROPONINI 0.03*   BNP: Invalid input(s): POCBNP CBG: Recent Labs  Lab 10/22/17 0748 10/22/17 1210  GLUCAP 77 106*      Disposition and Follow-up: Discharge Instructions    Ambulatory referral to Neurology   Complete by:  As directed    An appointment is requested in approximately: 2 Week(s): for seizures, new onset   Diet - low sodium heart healthy   Complete by:  As directed    Discharge instructions   Complete by:  As directed    Discharge instructions:  Patient advised of seizure precautions as follows: Per [facility] statutes, patients with seizures are not allowed to drive until they have been seizure-free for six months. Use caution when using heavy equipment or power tools. Avoid working on ladders or at heights. Take showers instead of baths. Ensure the water temperature is not too high on the home water heater. Do not go swimming alone. When caring for infants or small children, sit down when holding, feeding, or changing them to minimize risk of injury to the child in the event you have a seizure.   Also, Maintain good sleep hygiene. Avoid alcohol.  -->Call 911 and bring the patient back to the ED if:  A. The seizure lasts longer than 5 minutes.   B. The patient doesn't awaken shortly after the seizure C. The patient has new problems such as difficulty seeing, speaking or moving D. The patient was injured during the seizure E. The patient has a temperature over 102 F (39C) F. The patient vomited and now is having trouble breathing   Increase activity slowly   Complete by:  As directed        DISPOSITION home    [place] up.   Specialty:  Home Health Services Why:  home health services arranged Contact information: [address] [place] [address] [phone]        Plotnikov, Evie Lacks, MD. Schedule an appointment as soon as possible for a visit.   Specialty:  Internal Medicine Why:  please check PT/INR on 5/10, then again on 5/13 for coumadin dose adjustment Contact information: [place] [address] [phone]            Time coordinating discharge:  37 minutes  Signed:   Estill Cotta M.D. [facility] 10/22/2017, 1:36 PM Pager: [phone]

## 2017-10-22 NOTE — Care Management Note (Signed)
Case Management Note Original Note: Sharin Mons, RN 10/21/2017, 11:41 AM    Patient Details  Name: Tracy Peters MRN: [id number] Date of Birth: 08-10-1933  Subjective/Objective:    Presents with seizure like activity from home with husband. Hx of right radial artery thrombosis on Coumadin, iron deficiency anemia, hypertension. Owns 2 walkers/ 2 canes per pt. Requires min.assist with ADL's PTA.         Mila Homer (Spouse) Essie Hart (Daughter)    [phone]      PCP: Donley Redder  Action/Plan: Transition to home with home health services to follow (RN,PT) when medically stable. Husband states @ d/c pt will also receive private duty services from [place], 9am-5pm, qd.  Husband states will provide transportation to home.  Expected Discharge Date:  10/22/2017               Expected Discharge Plan:  [place]  In-House Referral:     Discharge planning Services  CM Consult  Post Acute Care Choice:    Choice offered to:  Spouse, Patient  DME Arranged:    DME Agency:  [facility] [employer]  [place]:  RN, PT [place] Agency:  [facility] ([facility]), pending MD's order. NCM has requested orders from MD.  Status of Service:  In process, will continue to follow  If discussed at Long Length of Stay Meetings, dates discussed:    Additional Comments:  10/22/17 J. Cantrell Larouche, Therapist, sports, BSN Pt medically stable for [employer] home today with spouse; pt states she is active with [facility] for [facility] for PT/INR checks at home.  Notified Tiffany with [facility] of resumption of [facility] and addition of PT/OT and HH aide.  No DME needed per pt/spouse.  Paged Md to update [facility] order as to when next INR check is needed.   Reinaldo Raddle, RN, BSN  Trauma/Neuro ICU Case Manager [phone]  Ella Bodo, RN 10/22/2017, 12:26 PM

## 2017-10-22 NOTE — Progress Notes (Signed)
Patient was discharged home with home health by MD order; discharged instructions review and give to patient and her husband with care notes and prescriptions; IV DIC; dressing on LLE was changed; patient will be escorted to the car by nurse tech via wheelchair.

## 2017-10-23 ENCOUNTER — Telehealth: Payer: Self-pay | Admitting: Internal Medicine

## 2017-10-23 ENCOUNTER — Telehealth: Payer: Self-pay | Admitting: *Deleted

## 2017-10-23 NOTE — Telephone Encounter (Signed)
Reviewed records, previously on 60mg /week dose by Dr Telford Nab, recently started new AED with interaction with warfarin,  OK to continue with plan for 5mg /day dose, would have her follow up in clinic on Monday with Dr Elie Confer.

## 2017-10-23 NOTE — Telephone Encounter (Signed)
Copied from [place] [id number]. Topic: Quick Communication - See Telephone Encounter >> Oct 23, 2017  2:04 PM Synthia Innocent wrote: CRM for notification. See Telephone encounter for: 10/23/17. Need verbal orders for Home Health Nursing, 2x a week for 1 weeks, 1x a week for 3weeks

## 2017-10-23 NOTE — Telephone Encounter (Signed)
Patient would like a call back as she forgot the mg of Medication she needs to take until she is seen on Monday.  Patient would like another call back.

## 2017-10-23 NOTE — Telephone Encounter (Signed)
Transition Care Management Follow-up Telephone Call   Date discharged? 10/22/17   How have you been since you were released from the hospital? Pt states she is doing fairly well   Do you understand why you were in the hospital? YES   Do you understand the discharge instructions? YES   Where were you discharged to? Home   Items Reviewed:  Medications reviewed: YES  Allergies reviewed: YES  Dietary changes reviewed: YES, heart healthy  Referrals reviewed: No referral needed   Functional Questionnaire:   Activities of Daily Living (ADLs):   She states she are independent in the following: bathing and hygiene, feeding, continence, grooming, toileting and dressing States they require assistance with the following: ambulation   Any transportation issues/concerns?: NO   Any patient concerns? NO   Confirmed importance and date/time of follow-up visits scheduled YES, appt 10/30/17  Provider Appointment booked with Dr. Alain Marion  Confirmed with patient if condition begins to worsen call PCP or go to the ER.  Patient was given the office number and encouraged to call back with question or concerns.  : YES

## 2017-10-23 NOTE — Telephone Encounter (Signed)
Verbals given, FYI 

## 2017-10-23 NOTE — Telephone Encounter (Signed)
Call from [place] at [facility]  - stated pt had been in the hospital ; INR yesterday was 3.4 and pt did not take any Coumadin and was told to re-start Coumadin 5 mg daily.Today's INR is  1.9 . Please advise.

## 2017-10-23 NOTE — Telephone Encounter (Signed)
Called Amanda,RN [facility] - no answer; left message on telephone# given. Also called pt - instructed to continue Coumadin 5 mg daily and stated she will be able to come Monday 5/13 to see Dr Elie Confer - appt scheduled @ 1130 AM/pt informed.

## 2017-10-26 ENCOUNTER — Ambulatory Visit (INDEPENDENT_AMBULATORY_CARE_PROVIDER_SITE_OTHER): Payer: 59 | Admitting: Pharmacist

## 2017-10-26 ENCOUNTER — Telehealth: Payer: Self-pay | Admitting: *Deleted

## 2017-10-26 DIAGNOSIS — Z86718 Personal history of other venous thrombosis and embolism: Secondary | ICD-10-CM

## 2017-10-26 DIAGNOSIS — Z7901 Long term (current) use of anticoagulants: Secondary | ICD-10-CM

## 2017-10-26 DIAGNOSIS — D6859 Other primary thrombophilia: Secondary | ICD-10-CM

## 2017-10-26 DIAGNOSIS — I742 Embolism and thrombosis of arteries of the upper extremities: Secondary | ICD-10-CM

## 2017-10-26 DIAGNOSIS — Z5181 Encounter for therapeutic drug level monitoring: Secondary | ICD-10-CM | POA: Diagnosis not present

## 2017-10-26 LAB — POCT INR: INR: 2.2

## 2017-10-26 NOTE — Telephone Encounter (Signed)
VO received from Dr. Earnie Larsson to have repeat INR drawn by [facility] on Fri 10/30/2017 prior to 1000 and call results to Dr. Elie Confer at [phone]. Spoke with Otila Kluver at [employer] [facility] and she will have her nurse, Baxter Flattery call back for VO. Hubbard Hartshorn, RN, BSN

## 2017-10-26 NOTE — Progress Notes (Signed)
Anticoagulation Management Tracy Peters is a 84 y.o. female who reports to the clinic for monitoring of warfarin treatment.    Indication: Chronic anticoagulation, History of thrombosis of right radial artery; primary hypercoagulable state.    Duration: indefinite Supervising physician: Murriel Hopper  Anticoagulation Clinic Visit History: Patient does not report signs/symptoms of bleeding or thromboembolism  Other recent changes: No diet, medications, lifestyle changes EXCEPT as noted in patient findings.  Anticoagulation Episode Summary    Current INR goal:   2.0-3.0  TTR:   69.3 % (6.3 y)  Next INR check:   08/31/2017  INR from last check:   2.20 (10/26/2017)  Weekly max warfarin dose:     Target end date:   Indefinite  INR check location:   Anticoagulation Clinic  Preferred lab:     Send INR reminders to:   ANTICOAG IMP   Indications   Chronic anticoagulation [Z79.01] DVT HX OF [Z86.718] 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HCC) [I74.2] Primary hypercoagulable state ([place]) [D68.59] [D68.59] Long term (current) use of anticoagulants [Z79.01] [Z79.01]       Comments:         Anticoagulation Care Providers    Provider Role Specialty Phone number   Annia Belt, MD Referring Oncology [phone]      Allergies  Allergen Reactions  . Bee Venom Anaphylaxis and Other (See Comments)    Yellow jackets, wasps as well  . Ivp Dye [Iodinated Diagnostic Agents] Anaphylaxis  . Shellfish Allergy Anaphylaxis  . Temazepam Other (See Comments)    Severe Hallucinations and Paranoia  . Amlodipine Besylate Swelling  . Aspirin Other (See Comments)    REACTION: unspecified  . Atorvastatin Diarrhea  . Cholestyramine Other (See Comments)    REACTION: mouth irritation  . Codeine Phosphate Nausea And Vomiting  . Demerol [Meperidine] Other (See Comments)    Severe Hallucination!!!!  . Diphenhydramine Hcl Other (See Comments)    hyperactive  . Doxycycline Nausea Only  .  Gentamicin Sulfate Other (See Comments)    REACTION: unspecified  . Meclizine Hcl Other (See Comments)    REACTION: more dizziness on it  . Meperidine Hcl Other (See Comments)    Unknown  . Metronidazole Other (See Comments)    REACTION: bad taste  . Moxifloxacin Other (See Comments)    REACTION: insomnia Able to use Cipro  . Sulfamethoxazole Other (See Comments)    Kidney problems  . Penicillins Swelling, Rash and Other (See Comments)    Has patient had a PCN reaction causing immediate rash, facial/tongue/throat swelling, SOB or lightheadedness with hypotension: Yes Has patient had a PCN reaction causing severe rash involving mucus membranes or skin necrosis: No Has patient had a PCN reaction that required hospitalization: No Has patient had a PCN reaction occurring within the last 10 years: No If all of the above answers are "NO", then may proceed with Cephalosporin use.    Prior to Admission medications   Medication Sig Start Date End Date Taking? Authorizing Provider  divalproex (DEPAKOTE) 250 MG DR tablet Take 1 tablet (250 mg total) by mouth 3 (three) times daily. 10/22/17  Yes Rai, Ripudeep K, MD  docusate sodium (COLACE) 100 MG capsule Take 1 capsule (100 mg total) by mouth daily as needed for mild constipation. 10/22/17  Yes Rai, Ripudeep K, MD  ferrous sulfate 325 (65 FE) MG tablet Take 1 tablet (325 mg total) by mouth 3 (three) times daily after meals. Patient taking differently: Take 325 mg by mouth daily with breakfast.  06/18/17  Yes Babish, Rodman Key, PA-C  fish oil-omega-3 fatty acids 1000 MG capsule Take 2 g by mouth daily.    Yes [provider]  fluticasone (FLONASE) 50 MCG/ACT nasal spray Place 2 sprays into both nostrils daily. Patient taking differently: Place 2 sprays into both nostrils daily as needed for allergies or rhinitis.  07/29/17  Yes Plotnikov, Evie Lacks, MD  losartan (COZAAR) 100 MG tablet Take 1 tablet (100 mg total) by mouth daily. 07/29/17  Yes  Plotnikov, Evie Lacks, MD  mupirocin ointment (BACTROBAN) 2 % Apply topically daily. Cleanse wound to right lower leg with NS.  Apply mupirocin ointment to wound.  Cover with 4x4 gauze and wrap with kerlix/tape.  Change daily. 10/23/17  Yes Rai, Ripudeep K, MD  valACYclovir (VALTREX) 1000 MG tablet Take 1 tablet (1,000 mg total) by mouth 2 (two) times daily. 01/27/17  Yes Plotnikov, Evie Lacks, MD  warfarin (COUMADIN) 5 MG tablet Take 1 tablet (5 mg total) by mouth daily at 2 PM. HOLD warfarin today (10/22/17), if INR less than 3, start warfarin 5mg  daily on 5/10. 10/23/17  Yes Rai, Ripudeep K, MD  zolpidem (AMBIEN) 10 MG tablet Take 1 tablet (10 mg total) by mouth at bedtime as needed for sleep. 10/22/17  Yes Rai, Ripudeep K, MD  EPINEPHrine 0.3 mg/0.3 mL IJ SOAJ injection As dirrected Patient not taking: Reported on 10/26/2017 01/27/17   Plotnikov, Evie Lacks, MD   Past Medical History:  Diagnosis Date  . Adenomatous colon polyp   . Allergic rhinitis    uses FLonase nightly  . Anemia   . Arthritis    joint pain  . Blood transfusion   . Bronchitis    hx of-7-11yrs ago  . Carpal tunnel syndrome    numbness and tingling   . Cataracts, bilateral   . Chronic anticoagulation 03/21/2013  . Congenital absence of one kidney   . Diarrhea    takes Immodium daily as needed or Lomotil   . Diverticulosis   . GERD (gastroesophageal reflux disease)    mild  . Herpes    takes Valtrex daily  . History of colon polyps   . History of DVT (deep vein thrombosis) 1998   right arm  . History of staph infection   . Hyperlipidemia    Medical MD doesn't think its absolutely necessary  . Hypertension    takes Losartan daily  . IBS (irritable bowel syndrome)    takes Electronics engineer daily  . Insomnia    takes Ambien nightly as needed  . LBP (low back pain)   . Long term (current) use of anticoagulants    Dr. Beryle Beams  . Nocturia   . Osteoporosis   . Raynaud's disease   . 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 Winfield Cunas  . Seizures ([place]) 10/20/2017  . 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[place]) 09/16/2011   Right digital artery 1998 idiopathic   . Urinary leakage   . UTI (urinary tract infection)    Social History   Socioeconomic History  . Marital status: Married    Spouse name: Not on file  . Number of children: 2  . Years of education: Not on file  . Highest education level: Not on file  Occupational History  . Occupation: Retired    Fish farm manager: RETIRED  Social Needs  . Financial resource strain: Not on file  . Food insecurity:    Worry: Not on file    Inability: Not on file  .  Transportation needs:    Medical: Not on file    Non-medical: Not on file  Tobacco Use  . Smoking status: Never Smoker  . Smokeless tobacco: Never Used  Substance and Sexual Activity  . Alcohol use: No    Alcohol/week: 0.0 oz  . Drug use: No  . Sexual activity: Never  Lifestyle  . Physical activity:    Days per week: Not on file    Minutes per session: Not on file  . Stress: Not on file  Relationships  . Social connections:    Talks on phone: Not on file    Gets together: Not on file    Attends religious service: Not on file    Active member of club or organization: Not on file    Attends meetings of clubs or organizations: Not on file    Relationship status: Not on file  Other Topics Concern  . Not on file  Social History Narrative  . Not on file   Family History  Problem Relation Age of Onset  . Breast cancer Sister   . Kidney disease Sister 34       nephritis  . Parkinsonism Mother   . Heart disease Brother   . Osteoarthritis Other   . Ulcerative colitis Sister   . Colon cancer Neg Hx     ASSESSMENT Recent Results: The most recent result is correlated with 60 mg per week: Lab Results  Component Value Date   INR 2.20 10/26/2017   INR 3.40 10/22/2017   INR 2.68 10/21/2017   PROTIME 39.6 (H) 04/03/2015    Anticoagulation Dosing: Description   Take 2 of your 5mg   peach-colored tablets today; on Tuesday--take 1 & 1/2 tablets; Wednesday, take 2 tablets, Thursday--take 1 & 1/2 tablets. Home Health Agency RN will visit in home setting and collect a fingerstick INR and call results to me. DR Azucena Freed PATIENT (route warfarin notes and list as authorizing provider for LOS & Follow-up, lab orders and warfarin prescriptions), do not list attending physician      INR today: Therapeutic  PLAN Weekly dose was unchanged.  Patient Instructions  Patient instructed to take medications as defined in the Anti-coagulation Track section of this encounter.  Patient instructed to take  today's dose.  Patient instructed to take 2 of your 5mg  peach-colored tablets today; on Tuesday--take 1 & 1/2 tablets; Wednesday, take 2 tablets, Thursday--take 1 & 1/2 tablets. Home Health Agency RN will visit in home setting and collect a fingerstick INR and call results to me. Patient verbalized understanding of these instructions.     Patient advised to contact clinic or seek medical attention if signs/symptoms of bleeding or thromboembolism occur.  Patient verbalized understanding by repeating back information and was advised to contact me if further medication-related questions arise. Patient was also provided an information handout.  Follow-up Return in 4 days (on 10/30/2017) for Follow up INR by [facility] RN .  Pennie Banter, PharmD, CACP, CPP  15 minutes spent face-to-face with the patient during the encounter. 50% of time spent on education. 50% of time was spent on fingerstick point of care INR sample collection, processing, results determination and documentation in [email or web address].

## 2017-10-26 NOTE — Patient Instructions (Signed)
Patient instructed to take medications as defined in the Anti-coagulation Track section of this encounter.  Patient instructed to take  today's dose.  Patient instructed to take 2 of your 5mg  peach-colored tablets today; on Tuesday--take 1 & 1/2 tablets; Wednesday, take 2 tablets, Thursday--take 1 & 1/2 tablets. Home Health Agency RN will visit in home setting and collect a fingerstick INR and call results to me. Patient verbalized understanding of these instructions.

## 2017-10-27 NOTE — Telephone Encounter (Signed)
Thank you....when queried yesterday, she stated [employer].

## 2017-10-27 NOTE — Telephone Encounter (Signed)
Verified that patient is working with [facility] [employer] not [facility]. VO given to [place] at [place] at [facility] for POC INR this Friday before 1000 and call results to Dr. Elie Confer. Hubbard Hartshorn, RN, BSN

## 2017-10-28 ENCOUNTER — Telehealth: Payer: Self-pay | Admitting: Internal Medicine

## 2017-10-28 DIAGNOSIS — Z471 Aftercare following joint replacement surgery: Secondary | ICD-10-CM | POA: Diagnosis not present

## 2017-10-28 DIAGNOSIS — Z4789 Encounter for other orthopedic aftercare: Secondary | ICD-10-CM | POA: Diagnosis not present

## 2017-10-28 DIAGNOSIS — Z96641 Presence of right artificial hip joint: Secondary | ICD-10-CM | POA: Diagnosis not present

## 2017-10-28 NOTE — Progress Notes (Signed)
Reviewed Thanks DrG 

## 2017-10-28 NOTE — Progress Notes (Signed)
INTERNAL MEDICINE TEACHING ATTENDING ADDENDUM  I agree with pharmacy recommendations as outlined in their note.   Alexander N Raines, MD  

## 2017-10-28 NOTE — Telephone Encounter (Signed)
Copied from [place] [id number]. Topic: Quick Communication - See Telephone Encounter >> Oct 28, 2017  3:38 PM Neva Seat wrote: Philippa [place]. W/ [facility]  [phone]  PT Verbal order: 2 times a week for 8 weeks

## 2017-10-29 NOTE — Telephone Encounter (Signed)
Ok Thx 

## 2017-10-29 NOTE — Telephone Encounter (Signed)
Notified [name] w/MD response.Marland KitchenJohny Peters

## 2017-10-30 ENCOUNTER — Telehealth: Payer: Self-pay | Admitting: Pharmacist

## 2017-10-30 ENCOUNTER — Encounter: Payer: Self-pay | Admitting: Internal Medicine

## 2017-10-30 ENCOUNTER — Other Ambulatory Visit (INDEPENDENT_AMBULATORY_CARE_PROVIDER_SITE_OTHER): Payer: 59

## 2017-10-30 ENCOUNTER — Ambulatory Visit (INDEPENDENT_AMBULATORY_CARE_PROVIDER_SITE_OTHER): Payer: 59 | Admitting: Internal Medicine

## 2017-10-30 DIAGNOSIS — I10 Essential (primary) hypertension: Secondary | ICD-10-CM

## 2017-10-30 DIAGNOSIS — R569 Unspecified convulsions: Secondary | ICD-10-CM | POA: Diagnosis not present

## 2017-10-30 DIAGNOSIS — Z7901 Long term (current) use of anticoagulants: Secondary | ICD-10-CM

## 2017-10-30 DIAGNOSIS — D500 Iron deficiency anemia secondary to blood loss (chronic): Secondary | ICD-10-CM

## 2017-10-30 DIAGNOSIS — G4700 Insomnia, unspecified: Secondary | ICD-10-CM

## 2017-10-30 LAB — CBC WITH DIFFERENTIAL/PLATELET
BASOS ABS: 0.1 10*3/uL (ref 0.0–0.1)
Basophils Relative: 1 % (ref 0.0–3.0)
EOS ABS: 0.1 10*3/uL (ref 0.0–0.7)
Eosinophils Relative: 0.9 % (ref 0.0–5.0)
HCT: 39.1 % (ref 36.0–46.0)
HEMOGLOBIN: 13.1 g/dL (ref 12.0–15.0)
Lymphocytes Relative: 29.7 % (ref 12.0–46.0)
Lymphs Abs: 1.8 10*3/uL (ref 0.7–4.0)
MCHC: 33.6 g/dL (ref 30.0–36.0)
MCV: 109.2 fl — ABNORMAL HIGH (ref 78.0–100.0)
Monocytes Absolute: 0.8 10*3/uL (ref 0.1–1.0)
Monocytes Relative: 12.4 % — ABNORMAL HIGH (ref 3.0–12.0)
Neutro Abs: 3.4 10*3/uL (ref 1.4–7.7)
Neutrophils Relative %: 56 % (ref 43.0–77.0)
Platelets: 424 10*3/uL — ABNORMAL HIGH (ref 150.0–400.0)
RBC: 3.58 Mil/uL — AB (ref 3.87–5.11)
RDW: 13.6 % (ref 11.5–15.5)
WBC: 6.1 10*3/uL (ref 4.0–10.5)

## 2017-10-30 LAB — BASIC METABOLIC PANEL
BUN: 10 mg/dL (ref 6–23)
CALCIUM: 9.6 mg/dL (ref 8.4–10.5)
CO2: 29 mEq/L (ref 19–32)
CREATININE: 0.77 mg/dL (ref 0.40–1.20)
Chloride: 88 mEq/L — ABNORMAL LOW (ref 96–112)
GFR: 76.24 mL/min (ref >60.00)
Glucose, Bld: 87 mg/dL (ref 70–99)
Potassium: 4.9 mEq/L (ref 3.5–5.1)
Sodium: 125 mEq/L — ABNORMAL LOW (ref 135–145)

## 2017-10-30 LAB — HEPATIC FUNCTION PANEL
ALK PHOS: 47 U/L (ref 39–117)
ALT: 13 U/L (ref 0–35)
AST: 18 U/L (ref 0–37)
Albumin: 4 g/dL (ref 3.5–5.2)
BILIRUBIN DIRECT: 0.1 mg/dL (ref 0.0–0.3)
TOTAL PROTEIN: 7.7 g/dL (ref 6.0–8.3)
Total Bilirubin: 0.4 mg/dL (ref 0.2–1.2)

## 2017-10-30 MED ORDER — ZOLPIDEM TARTRATE 10 MG PO TABS: ORAL_TABLET | ORAL | 1 refills | Status: DC

## 2017-10-30 NOTE — Assessment & Plan Note (Addendum)
5/19 ?Temazepam related On Depakote Not driving

## 2017-10-30 NOTE — Telephone Encounter (Signed)
[facility] RN Tracy Peters, using patient's home phone called me at [phone] reporting results of FS POC INR 3.2 on weighted daily dose of 8.75mg /day (after 4 doses of 10mg  alternating with 7.5mg  qod). Will DECREASE daily dose to 7.5mg  PO QD warfarin using her 5mg  tabs. She will take 1 & 1/2 x 5mg  (7.5mg ) daily at [facility] reports no bleeding or thrombotic events endorsed by the patient. No new medications, no medications discontinued, no missed doses, no extra doses.

## 2017-10-30 NOTE — Assessment & Plan Note (Signed)
Losartan Labs 

## 2017-10-30 NOTE — Assessment & Plan Note (Signed)
Zolpidem 15 mg/repeat in 4 h prn - pt has to use higher #  x years  Potential benefits of a long term benzodiazepines  use as well as potential risks  and complications were explained to the patient and were aknowledged.

## 2017-10-30 NOTE — Progress Notes (Signed)
Subjective:  Patient ID: Tracy Peters, female    DOB: 06/28/1933  Age: 84 y.o. MRN: [id number]  CC: No chief complaint on file.   HPI Tracy Peters presents for a seizure f/u Pt had a reaction to Temazepam F/u insomnia, anticoagulation, low sodium  Per hx:  "Brief H and P: For complete details please refer to admission H and P, but in brief Patient is a 84 year old female with history ofright radial artery thrombosis on Coumadin, iron deficiency anemia, hypertension was brought to the ER after patient had a seizure-like episode. As per the husband,patient had been feeling increasingly irritable for the last 2 days PTA. She had taken 1 dose of temazepam to sleep, that was the first time patient had taken it. Patient is also on hydrocodone for recent hip surgery.On the evening of admission, patient suddenly started having shaking of the right upper extremity and was not talking correctly and trying to take some medications involuntarily.Patient was brought to ED, had another seizure-like activity in ED. She was given IV Keppra loading dose and admitted for further work-up.   Hospital Course:  Seizures [facility]), new onset, and generalized tonic-clonic, witnessed -No further seizures, patient received IV Keppra however subsequently was noted to be much more drowsy and lethargic. -EEG negative, MRI brain with and without contrast, showed no acute or reversible findings, age-related atrophy with moderate chronic small vessel ischemic changes -Keppra was discontinued, placed on Depakote, tolerated well -Seizure precautions, no driving for 6 months, instructions placed in the ABS  Right sided jaw pain with chewing -Swallow evaluation recommended regular thin liquids, no aspiration, concern for trigeminal neuralgia, complaining of right jaw pain on chewing -Discussed with neurology, Dr. Cheral Marker, do not think it is temporal arteritis. Likely masseter muscle pain secondary to  extended jaw clenching during the recent seizure -ESR CRP only mildly up,possibly age-related and seizures, not consistent with acute temporal arteritis. -UA negative for UTI   Essential hypertension -BP currently stable, continue losartan  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[place]) -Patient is on Coumadin outpatient, INR 3.4 at the time of discharge.  Patient instructed to hold Coumadin today.  Repeat INR in a.m., if less than 3, start low-dose of Coumadin 5 mg daily.  History of iron deficiency -Continue iron supplements  Mild hyponatremia -Sodium 129 at the time of admission, possibly due to dehydration, #1, improved, 135  Right lower extremity wound -Nonhealing wound to the right anterior lower leg, worsenedwhen she fell with a seizure -Wound care consult was placed for dressing daily  Left ankle pain -Possibly due to sprain, left ankle x-ray negative for any fracture dislocation"    Outpatient Medications Prior to Visit  Medication Sig Dispense Refill  . divalproex (DEPAKOTE) 250 MG DR tablet Take 1 tablet (250 mg total) by mouth 3 (three) times daily. 90 tablet 3  . docusate sodium (COLACE) 100 MG capsule Take 1 capsule (100 mg total) by mouth daily as needed for mild constipation.    Marland Kitchen EPINEPHrine 0.3 mg/0.3 mL IJ SOAJ injection As dirrected 1 Device 3  . ferrous sulfate 325 (65 FE) MG tablet Take 1 tablet (325 mg total) by mouth 3 (three) times daily after meals. (Patient taking differently: Take 325 mg by mouth daily with breakfast. )  3  . fish oil-omega-3 fatty acids 1000 MG capsule Take 2 g by mouth daily.     . fluticasone (FLONASE) 50 MCG/ACT nasal spray Place 2 sprays into both nostrils daily. (Patient taking differently: Place 2  sprays into both nostrils daily as needed for allergies or rhinitis. ) 48 g 3  . losartan (COZAAR) 100 MG tablet Take 1 tablet (100 mg total) by mouth daily. 90 tablet 3  . mupirocin ointment (BACTROBAN) 2 % Apply topically daily.  Cleanse wound to right lower leg with NS.  Apply mupirocin ointment to wound.  Cover with 4x4 gauze and wrap with kerlix/tape.  Change daily. 22 g 3  . valACYclovir (VALTREX) 1000 MG tablet Take 1 tablet (1,000 mg total) by mouth 2 (two) times daily. 180 tablet 3  . warfarin (COUMADIN) 5 MG tablet Take 1 tablet (5 mg total) by mouth daily at 2 PM. HOLD warfarin today (10/22/17), if INR less than 3, start warfarin 16m daily on 5/10. 30 tablet 0  . zolpidem (AMBIEN) 10 MG tablet Take 1 tablet (10 mg total) by mouth at bedtime as needed for sleep. 30 tablet 0   No facility-administered medications prior to visit.     ROS Review of Systems  Constitutional: Negative for activity change, appetite change, chills, fatigue and unexpected weight change.  HENT: Negative for congestion, mouth sores and sinus pressure.   Eyes: Negative for visual disturbance.  Respiratory: Negative for cough and chest tightness.   Gastrointestinal: Negative for abdominal pain and nausea.  Genitourinary: Negative for difficulty urinating, frequency and vaginal pain.  Musculoskeletal: Negative for back pain and gait problem.  Skin: Negative for pallor and rash.  Neurological: Negative for dizziness, tremors, weakness, numbness and headaches.  Psychiatric/Behavioral: Positive for dysphoric mood and sleep disturbance. Negative for confusion and suicidal ideas. The patient is nervous/anxious.     Objective:  BP (!) 146/88 (BP Location: Left Arm, Patient Position: Sitting, Cuff Size: Normal)   Pulse 84   Temp 97.9 F (36.6 C) (Oral)   Ht 5' 4"  (1.626 m)   Wt 126 lb (57.2 kg)   SpO2 99%   BMI 21.63 kg/m   BP Readings from Last 3 Encounters:  10/30/17 (!) 146/88  10/22/17 (!) 154/65  08/23/17 (!) 162/85    Wt Readings from Last 3 Encounters:  10/30/17 126 lb (57.2 kg)  10/20/17 121 lb 14.6 oz (55.3 kg)  08/23/17 120 lb (54.4 kg)    Physical Exam  Constitutional: She appears well-developed. No distress.  HENT:   Head: Normocephalic.  Right Ear: External ear normal.  Left Ear: External ear normal.  Nose: Nose normal.  Mouth/Throat: Oropharynx is clear and moist.  Eyes: Pupils are equal, round, and reactive to light. Conjunctivae are normal. Right eye exhibits no discharge. Left eye exhibits no discharge.  Neck: Normal range of motion. Neck supple. No JVD present. No tracheal deviation present. No thyromegaly present.  Cardiovascular: Normal rate, regular rhythm and normal heart sounds.  Pulmonary/Chest: No stridor. No respiratory distress. She has no wheezes.  Abdominal: Soft. Bowel sounds are normal. She exhibits no distension and no mass. There is no tenderness. There is no rebound and no guarding.  Musculoskeletal: She exhibits tenderness. She exhibits no edema.  Lymphadenopathy:    She has no cervical adenopathy.  Neurological: She displays normal reflexes. No cranial nerve deficit. She exhibits normal muscle tone. Coordination abnormal.  Skin: No rash noted. No erythema.  Psychiatric: Her behavior is normal. Judgment and thought content normal.   A walker A/o/c  Lab Results  Component Value Date   WBC 9.5 10/21/2017   HGB 11.0 (L) 10/21/2017   HCT 32.7 (L) 10/21/2017   PLT 259 10/21/2017   GLUCOSE 99 10/21/2017  CHOL 196 01/03/2016   TRIG 85.0 01/03/2016   HDL 63.10 01/03/2016   LDLDIRECT 129.8 05/31/2012   LDLCALC 116 (H) 01/03/2016   ALT 13 (L) 10/20/2017   AST 27 10/20/2017   NA 135 10/21/2017   K 3.8 10/21/2017   CL 104 10/21/2017   CREATININE 0.73 10/21/2017   BUN 9 10/21/2017   CO2 23 10/21/2017   TSH 2.749 10/20/2017   INR 2.20 10/26/2017    Ct Head Wo Contrast  Result Date: 10/20/2017 CLINICAL DATA:  84 year old female with dizziness and facial droop. EXAM: CT HEAD WITHOUT CONTRAST TECHNIQUE: Contiguous axial images were obtained from the base of the skull through the vertex without intravenous contrast. COMPARISON:  Head CT dated 09/30/2016 FINDINGS: Brain: There  is mild age-related atrophy and chronic microvascular ischemic changes. There is no acute intracranial hemorrhage. No mass effect or midline shift. No extra-axial fluid collection. Vascular: No hyperdense vessel or unexpected calcification. Skull: Normal. Negative for fracture or focal lesion. Sinuses/Orbits: No acute finding. Other: None IMPRESSION: 1. No acute intracranial pathology. 2. Age-related atrophy and chronic microvascular ischemic changes. Electronically Signed   By: Anner Crete M.D.   On: 10/20/2017 01:49   Mr Jeri Cos KC Contrast  Result Date: 10/20/2017 CLINICAL DATA:  Brief loss of consciousness. Confusion. Speech disturbance. EXAM: MRI HEAD WITHOUT AND WITH CONTRAST TECHNIQUE: Multiplanar, multiecho pulse sequences of the brain and surrounding structures were obtained without and with intravenous contrast. CONTRAST:  10m MULTIHANCE GADOBENATE DIMEGLUMINE 529 MG/ML IV SOLN COMPARISON:  CT 10/20/2017.  MRI 10/12/2013. FINDINGS: Brain: Diffusion imaging does not show any acute or subacute infarction. Brainstem and cerebellum are normal. Cerebral hemispheres show age related atrophy with moderate chronic small-vessel ischemic changes throughout the deep and subcortical white matter. No cortical or large vessel territory infarction. No mass lesion, hemorrhage, hydrocephalus or extra-axial collection. After contrast administration, no abnormal enhancement occurs. Vascular: Major vessels at the base of the brain show flow. Skull and upper cervical spine: Negative Sinuses/Orbits: Clear/normal Other: None IMPRESSION: No acute or reversible finding. Age related atrophy. Moderate chronic small-vessel ischemic changes of the cerebral hemispheric white matter. Electronically Signed   By: MNelson ChimesM.D.   On: 10/20/2017 13:16   Dg Chest Port 1 View  Result Date: 10/20/2017 CLINICAL DATA:  84y/o  F; seizure. EXAM: PORTABLE CHEST 1 VIEW COMPARISON:  04/17/2016 chest radiograph FINDINGS: Stable normal  cardiac silhouette given projection and technique. Aortic atherosclerosis with calcification. Clear lungs. No pleural effusion or pneumothorax. No acute osseous abnormality is evident. Partially visualized anterior cervical fusion hardware. IMPRESSION: No active disease. Electronically Signed   By: LKristine GarbeM.D.   On: 10/20/2017 06:21   Dg Hips Bilat W Or Wo Pelvis 5 Views  Result Date: 10/20/2017 CLINICAL DATA:  84year old female with fall. EXAM: DG HIP (WITH OR WITHOUT PELVIS) 5+V BILAT COMPARISON:  None. FINDINGS: There is no acute fracture or dislocation. The bones are osteopenic. There is a total right hip arthroplasty. The arthroplasty components appear intact and in anatomic alignment. The soft tissues are grossly unremarkable. IMPRESSION: No acute fracture or dislocation. Electronically Signed   By: AAnner CreteM.D.   On: 10/20/2017 03:00    Assessment & Plan:   There are no diagnoses linked to this encounter. I am having BCearfoss Charo maintain her fish oil-omega-3 fatty acids, EPINEPHrine, valACYclovir, ferrous sulfate, losartan, fluticasone, docusate sodium, divalproex, mupirocin ointment, zolpidem, and warfarin.  No orders of the defined types were placed in this  encounter.    Follow-up: No follow-ups on file.  [name], MD

## 2017-10-30 NOTE — Assessment & Plan Note (Signed)
Labs

## 2017-10-30 NOTE — Assessment & Plan Note (Signed)
On Coumadin 

## 2017-10-31 LAB — IRON,TIBC AND FERRITIN PANEL
%SAT: 33 % (calc) (ref 11–50)
FERRITIN: 171 ng/mL (ref 20–288)
IRON: 102 ug/dL (ref 45–160)
TIBC: 310 ug/dL (ref 250–450)

## 2017-11-01 ENCOUNTER — Encounter: Payer: Self-pay | Admitting: Internal Medicine

## 2017-11-02 ENCOUNTER — Other Ambulatory Visit: Payer: Self-pay | Admitting: Internal Medicine

## 2017-11-02 DIAGNOSIS — I10 Essential (primary) hypertension: Secondary | ICD-10-CM

## 2017-11-02 NOTE — Telephone Encounter (Signed)
Pt is going through physical therapy.  Pt is not on any type of pain medication. Pt had a seizure and was hospitalized ( has already seen pcp about this)  Pt is in hip and back pain.  Pt has taken tramadol for many years.   Pharm [facility]  cb is [phone]

## 2017-11-03 ENCOUNTER — Telehealth: Payer: Self-pay | Admitting: Internal Medicine

## 2017-11-03 NOTE — Telephone Encounter (Signed)
Copied from [place] [id number]. Topic: Quick Communication - See Telephone Encounter >> Nov 03, 2017 10:38 AM Conception Chancy, NT wrote: CRM for notification. See Telephone encounter for: 11/03/17.  [place] is [facility], requesting verbal orders for home health occupational therapy for 1x a week for 4 weeks.   Cb# [phone]

## 2017-11-03 NOTE — Telephone Encounter (Signed)
Ok Thx 

## 2017-11-03 NOTE — Telephone Encounter (Signed)
Called [place] no answer LMOM w/MD response.Marland KitchenJohny Chess

## 2017-11-05 ENCOUNTER — Telehealth: Payer: Self-pay

## 2017-11-05 NOTE — Telephone Encounter (Signed)
Please advise  Copied from [place] [id number]. Topic: Quick Communication - See Telephone Encounter >> Nov 03, 2017  3:16 PM Antonieta Iba C wrote: CRM for notification. See Telephone encounter for: 11/03/17.  Pt called in to speak with her provider or his assistant. She said that she was advised that her sodium is low. Pt says that due to that she stop taking her Tramadol because she was advised that medication causes low sodium, pt says that she is having terrible hip pain and need pain medication. Pt says that she is unsure of what she should do.  Please advise.

## 2017-11-05 NOTE — Telephone Encounter (Signed)
There was no need to stop tramadol.  Nature has been on tramadol for a long time without a problem.  It is okay to restart tramadol.  Severe pain may cause hyponatremia as well.  Please go to the lab to do a repeat basic metabolic panel.  Cut back on fluid intake. Thanks

## 2017-11-06 NOTE — Telephone Encounter (Signed)
Pt.notified

## 2017-11-10 ENCOUNTER — Telehealth: Payer: Self-pay | Admitting: Internal Medicine

## 2017-11-10 NOTE — Telephone Encounter (Signed)
Copied from [place] [id number]. Topic: Quick Communication - Rx Refill/Question >> Nov 10, 2017 11:14 AM Tracy Peters wrote: Medication: divalproex (DEPAKOTE) 250 MG DR tablet [[id number]]   Pt called Peters/c [facility] told pt to call pcp about the medication above Peters/c they have not seen pt yet, pt states the medication above is too strong, the pt would like a weaker dose or an alternate medication, call pt to advise

## 2017-11-10 NOTE — Telephone Encounter (Signed)
Pt states she was prescribed Depakote 250mg  to take 3 times a day while at [facility] approximately 2-3 weeks ago. Pt states she has been taking the medication 3 times a day and the medication is too strong. Pt states she feels lethargic all the time and has no energy. Pt asking if Dr. Alain Marion could prescribe a different dose of the medication or if an alternative medication could be prescribed.

## 2017-11-11 ENCOUNTER — Other Ambulatory Visit (INDEPENDENT_AMBULATORY_CARE_PROVIDER_SITE_OTHER): Payer: 59

## 2017-11-11 DIAGNOSIS — I10 Essential (primary) hypertension: Secondary | ICD-10-CM

## 2017-11-11 LAB — BASIC METABOLIC PANEL
BUN: 14 mg/dL (ref 6–23)
CHLORIDE: 89 meq/L — AB (ref 96–112)
CO2: 28 mEq/L (ref 19–32)
Calcium: 9.1 mg/dL (ref 8.4–10.5)
Creatinine, Ser: 0.65 mg/dL (ref 0.40–1.20)
GFR: 92.7 mL/min (ref >60.00)
GLUCOSE: 68 mg/dL — AB (ref 70–99)
POTASSIUM: 4.4 meq/L (ref 3.5–5.1)
Sodium: 124 mEq/L — ABNORMAL LOW (ref 135–145)

## 2017-11-11 NOTE — Telephone Encounter (Signed)
[facility] RN Debbie calling from patient's home phone reporting INR 2.6 on 52.5mg  (1 & 1/2 x 5mg  warfarin per day) warfarin per week. Continue same regimen. Re-collect INR on 10-JUN-19. Depakote serum drug value has returned--within range. Patient endorsing increased lethargy to [facility] RN. Patient's PCP has been notified (documented as a phone note by the practice nurse/CMA)--awaiting decision by PCP. [facility] will have first visit with patient in mid-July. Patient states that [facility] has advised they must wait to see her at their first visit and they are uncomfortable changing dose of Depakote, since they did not order this dose or interval (was ordered by the hospitalist's while she was hospitalized here at [facility]).

## 2017-11-12 NOTE — Telephone Encounter (Signed)
Please advise about Depakote is Dr. Enis Slipper absence

## 2017-11-12 NOTE — Telephone Encounter (Signed)
Ok for depakote level - ok for verbal to have drawn per [place]

## 2017-11-13 NOTE — Telephone Encounter (Signed)
appt made for Tuesday with Tracy Peters to change med

## 2017-11-16 ENCOUNTER — Ambulatory Visit (INDEPENDENT_AMBULATORY_CARE_PROVIDER_SITE_OTHER): Payer: 59 | Admitting: Pharmacist

## 2017-11-16 DIAGNOSIS — Z86718 Personal history of other venous thrombosis and embolism: Secondary | ICD-10-CM

## 2017-11-16 DIAGNOSIS — D6859 Other primary thrombophilia: Secondary | ICD-10-CM | POA: Diagnosis not present

## 2017-11-16 DIAGNOSIS — Z7901 Long term (current) use of anticoagulants: Secondary | ICD-10-CM

## 2017-11-16 DIAGNOSIS — I742 Embolism and thrombosis of arteries of the upper extremities: Secondary | ICD-10-CM

## 2017-11-16 LAB — POCT INR: INR: 2.1 (ref 2.0–3.0)

## 2017-11-16 NOTE — Patient Instructions (Signed)
Patient instructed to take medications as defined in the Anti-coagulation Track section of this encounter.  Patient instructed to take today's dose.  Patient instructed to take  2 of your 5mg  peach-colored tablets on MONDAYS. All other days, take one and one-half (1 & 1/2 tablets of your 5mg  peach-colored warfarin tablets. Patient verbalized understanding of these instructions.

## 2017-11-16 NOTE — Progress Notes (Signed)
Reviewed thx DrG 

## 2017-11-16 NOTE — Progress Notes (Signed)
Anticoagulation Management Tracy Peters is a 84 y.o. female who reports to the clinic for monitoring of warfarin treatment.    Indication: DVT, history of; primary hypercoagulable state; long term current use of anticoagulant.  Duration: indefinite Supervising physician: Murriel Hopper  Anticoagulation Clinic Visit History: Patient does not report signs/symptoms of bleeding or thromboembolism  Other recent changes: No diet, medications, lifestyle changes other than as noted in her EMR.  Anticoagulation Episode Summary    Current INR goal:   2.0-3.0  TTR:   69.6 % (6.4 y)  Next INR check:   10/30/2017  INR from last check:     Weekly max warfarin dose:     Target end date:   Indefinite  INR check location:   Anticoagulation Clinic  Preferred lab:     Send INR reminders to:   ANTICOAG IMP   Indications   Chronic anticoagulation [Z79.01] DVT HX OF [Z86.718] 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HCC) [I74.2] Primary hypercoagulable state ([place]) [D68.59] [D68.59] Long term (current) use of anticoagulants [Z79.01] [Z79.01]       Comments:         Anticoagulation Care Providers    Provider Role Specialty Phone number   Annia Belt, MD Referring Oncology [phone]      Allergies  Allergen Reactions  . Bee Venom Anaphylaxis and Other (See Comments)    Yellow jackets, wasps as well  . Ivp Dye [Iodinated Diagnostic Agents] Anaphylaxis  . Shellfish Allergy Anaphylaxis  . Temazepam Other (See Comments)    Severe Hallucinations and Paranoia  . Amlodipine Besylate Swelling  . Aspirin Other (See Comments)    REACTION: unspecified  . Atorvastatin Diarrhea  . Cholestyramine Other (See Comments)    REACTION: mouth irritation  . Codeine Phosphate Nausea And Vomiting  . Demerol [Meperidine] Other (See Comments)    Severe Hallucination!!!!  . Diphenhydramine Hcl Other (See Comments)    hyperactive  . Doxycycline Nausea Only  . Gentamicin Sulfate Other (See Comments)     REACTION: unspecified  . Meclizine Hcl Other (See Comments)    REACTION: more dizziness on it  . Meperidine Hcl Other (See Comments)    Unknown  . Metronidazole Other (See Comments)    REACTION: bad taste  . Moxifloxacin Other (See Comments)    REACTION: insomnia Able to use Cipro  . Sulfamethoxazole Other (See Comments)    Kidney problems  . Penicillins Swelling, Rash and Other (See Comments)    Has patient had a PCN reaction causing immediate rash, facial/tongue/throat swelling, SOB or lightheadedness with hypotension: Yes Has patient had a PCN reaction causing severe rash involving mucus membranes or skin necrosis: No Has patient had a PCN reaction that required hospitalization: No Has patient had a PCN reaction occurring within the last 10 years: No If all of the above answers are "NO", then may proceed with Cephalosporin use.    Prior to Admission medications   Medication Sig Start Date End Date Taking? Authorizing Provider  divalproex (DEPAKOTE) 250 MG DR tablet Take 1 tablet (250 mg total) by mouth 3 (three) times daily. 10/22/17  Yes Rai, Ripudeep K, MD  docusate sodium (COLACE) 100 MG capsule Take 1 capsule (100 mg total) by mouth daily as needed for mild constipation. 10/22/17  Yes Rai, Ripudeep K, MD  EPINEPHrine 0.3 mg/0.3 mL IJ SOAJ injection As dirrected 01/27/17  Yes Plotnikov, Evie Lacks, MD  ferrous sulfate 325 (65 FE) MG tablet Take 1 tablet (325 mg total) by mouth 3 (three)  times daily after meals. Patient taking differently: Take 325 mg by mouth daily with breakfast.  06/18/17  Yes Babish, Rodman Key, PA-C  fish oil-omega-3 fatty acids 1000 MG capsule Take 2 g by mouth daily.    Yes [provider]  fluticasone (FLONASE) 50 MCG/ACT nasal spray Place 2 sprays into both nostrils daily. Patient taking differently: Place 2 sprays into both nostrils daily as needed for allergies or rhinitis.  07/29/17  Yes Plotnikov, Evie Lacks, MD  losartan (COZAAR) 100 MG tablet Take 1  tablet (100 mg total) by mouth daily. 07/29/17  Yes Plotnikov, Evie Lacks, MD  mupirocin ointment (BACTROBAN) 2 % Apply topically daily. Cleanse wound to right lower leg with NS.  Apply mupirocin ointment to wound.  Cover with 4x4 gauze and wrap with kerlix/tape.  Change daily. 10/23/17  Yes Rai, Ripudeep K, MD  valACYclovir (VALTREX) 1000 MG tablet Take 1 tablet (1,000 mg total) by mouth 2 (two) times daily. 01/27/17  Yes Plotnikov, Evie Lacks, MD  warfarin (COUMADIN) 5 MG tablet Take 1 tablet (5 mg total) by mouth daily at 2 PM. HOLD warfarin today (10/22/17), if INR less than 3, start warfarin 5mg  daily on 5/10. 10/23/17  Yes Rai, Ripudeep K, MD  zolpidem (AMBIEN) 10 MG tablet 10 mg at hs prn. OK to repeat 5 mg in 4 hrs prn 10/30/17  Yes Plotnikov, Evie Lacks, MD   Past Medical History:  Diagnosis Date  . Adenomatous colon polyp   . Allergic rhinitis    uses FLonase nightly  . Anemia   . Arthritis    joint pain  . Blood transfusion   . Bronchitis    hx of-7-75yrs ago  . Carpal tunnel syndrome    numbness and tingling   . Cataracts, bilateral   . Chronic anticoagulation 03/21/2013  . Congenital absence of one kidney   . Diarrhea    takes Immodium daily as needed or Lomotil   . Diverticulosis   . GERD (gastroesophageal reflux disease)    mild  . Herpes    takes Valtrex daily  . History of colon polyps   . History of DVT (deep vein thrombosis) 1998   right arm  . History of staph infection   . Hyperlipidemia    Medical MD doesn't think its absolutely necessary  . Hypertension    takes Losartan daily  . IBS (irritable bowel syndrome)    takes Electronics engineer daily  . Insomnia    takes Ambien nightly as needed  . LBP (low back pain)   . Long term (current) use of anticoagulants    Dr. Beryle Beams  . Nocturia   . Osteoporosis   . Raynaud's disease   . 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 Winfield Cunas  . Seizures ([place]) 10/20/2017  . 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 Urinary leakage   . UTI (urinary tract infection)    Social History   Socioeconomic History  . Marital status: Married    Spouse name: Not on file  . Number of children: 2  . Years of education: Not on file  . Highest education level: Not on file  Occupational History  . Occupation: Retired    Fish farm manager: RETIRED  Social Needs  . Financial resource strain: Not on file  . Food insecurity:    Worry: Not on file    Inability: Not on file  . Transportation needs:    Medical:  Not on file    Non-medical: Not on file  Tobacco Use  . Smoking status: Never Smoker  . Smokeless tobacco: Never Used  Substance and Sexual Activity  . Alcohol use: No    Alcohol/week: 0.0 oz  . Drug use: No  . Sexual activity: Never  Lifestyle  . Physical activity:    Days per week: Not on file    Minutes per session: Not on file  . Stress: Not on file  Relationships  . Social connections:    Talks on phone: Not on file    Gets together: Not on file    Attends religious service: Not on file    Active member of club or organization: Not on file    Attends meetings of clubs or organizations: Not on file    Relationship status: Not on file  Other Topics Concern  . Not on file  Social History Narrative  . Not on file   Family History  Problem Relation Age of Onset  . Breast cancer Sister   . Kidney disease Sister 54       nephritis  . Parkinsonism Mother   . Heart disease Brother   . Osteoarthritis Other   . Ulcerative colitis Sister   . Colon cancer Neg Hx     ASSESSMENT Recent Results: The most recent result is correlated with 52.5 mg per week: Lab Results  Component Value Date   INR 2.1 11/16/2017   INR 2.20 10/26/2017   INR 3.40 10/22/2017   PROTIME 39.6 (H) 04/03/2015    Anticoagulation Dosing: Description   Take 2 of your 5mg  peach-colored tablets on MONDAYS. All other days, take one and one-half (1 & 1/2 tablets of your 5mg   peach-colored warfarin tablets. DR Azucena Freed PATIENT (route warfarin notes and list as authorizing provider for LOS & Follow-up, lab orders and warfarin prescriptions), do not list attending physician      INR today: Therapeutic  PLAN Weekly dose was increased to 55mg  per week.   Patient Instructions  Patient instructed to take medications as defined in the Anti-coagulation Track section of this encounter.  Patient instructed to take today's dose.  Patient instructed to take  2 of your 5mg  peach-colored tablets on MONDAYS. All other days, take one and one-half (1 & 1/2 tablets of your 5mg  peach-colored warfarin tablets. Patient verbalized understanding of these instructions.     Patient advised to contact clinic or seek medical attention if signs/symptoms of bleeding or thromboembolism occur.  Patient verbalized understanding by repeating back information and was advised to contact me if further medication-related questions arise. Patient was also provided an information handout.  Follow-up Return in 1 month (on 12/14/2017) for Follow up INR at 1130h.  Pennie Banter, PharmD, CACP, CPP  15 minutes spent face-to-face with the patient during the encounter. 50% of time spent on education. 50% of time was spent on fingerstick point of care INR sample collection, processing, results determination, dose adjustment and documentation in [email or web address].

## 2017-11-17 ENCOUNTER — Ambulatory Visit (INDEPENDENT_AMBULATORY_CARE_PROVIDER_SITE_OTHER): Payer: 59 | Admitting: Family

## 2017-11-17 ENCOUNTER — Encounter: Payer: Self-pay | Admitting: Family

## 2017-11-17 ENCOUNTER — Other Ambulatory Visit: Payer: Self-pay | Admitting: Internal Medicine

## 2017-11-17 ENCOUNTER — Other Ambulatory Visit (INDEPENDENT_AMBULATORY_CARE_PROVIDER_SITE_OTHER): Payer: 59

## 2017-11-17 VITALS — BP 152/84 | HR 90 | Temp 97.9°F | Ht 64.0 in | Wt 128.0 lb

## 2017-11-17 DIAGNOSIS — E871 Hypo-osmolality and hyponatremia: Secondary | ICD-10-CM

## 2017-11-17 DIAGNOSIS — R569 Unspecified convulsions: Secondary | ICD-10-CM

## 2017-11-17 DIAGNOSIS — Z79899 Other long term (current) drug therapy: Secondary | ICD-10-CM | POA: Diagnosis not present

## 2017-11-17 LAB — COMPREHENSIVE METABOLIC PANEL
ALK PHOS: 49 U/L (ref 39–117)
ALT: 13 U/L (ref 0–35)
AST: 19 U/L (ref 0–37)
Albumin: 4.2 g/dL (ref 3.5–5.2)
BILIRUBIN TOTAL: 0.5 mg/dL (ref 0.2–1.2)
BUN: 15 mg/dL (ref 6–23)
CHLORIDE: 91 meq/L — AB (ref 96–112)
CO2: 31 mEq/L (ref 19–32)
Calcium: 9.7 mg/dL (ref 8.4–10.5)
Creatinine, Ser: 0.69 mg/dL (ref 0.40–1.20)
GFR: 86.52 mL/min (ref >60.00)
GLUCOSE: 73 mg/dL (ref 70–99)
POTASSIUM: 4.7 meq/L (ref 3.5–5.1)
SODIUM: 128 meq/L — AB (ref 135–145)
Total Protein: 7.3 g/dL (ref 6.0–8.3)

## 2017-11-17 NOTE — Progress Notes (Signed)
Tracy Peters is a 84 y.o. female with the following history as recorded in EpicCare:  Patient Active Problem List   Diagnosis Date Noted  . Seizure ([place]) 10/20/2017  . Dislocation of internal right hip prosthesis ([place]) 08/23/2017  . Anemia, iron deficiency 07/29/2017  . S/P right THA, PA 06/15/2017  . Right bursectomy 06/01/2017  . Preop exam for internal medicine 05/20/2017  . Cerumen impaction 01/27/2017  . Actinic keratosis 01/27/2017  . Primary hypercoagulable state ([place]) [D68.59] 11/17/2016  . Long term (current) use of anticoagulants [Z79.01] 11/17/2016  . Osteopenia 07/14/2016  . Right-sided chest wall pain 04/17/2016  . 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 Dysuria 06/15/2015  . Cervical spondylosis with myelopathy and radiculopathy 01/18/2014  . Cervical radiculitis 12/28/2013  . Left knee pain 12/28/2013  . Paronychia 12/28/2013  . Chronic anticoagulation 03/21/2013  . Well adult exam 06/02/2012  . 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[place]) 09/16/2011  . Urticaria 05/28/2011  . DIVERTICULOSIS, COLON 04/30/2010  . COLONIC POLYPS, ADENOMATOUS, HX OF 04/30/2010  . Dyslipidemia 09/20/2009  . VERTIGO 09/20/2009  . ALLERGIC RHINITIS 03/27/2009  . INSOMNIA, PERSISTENT 09/20/2008  . PARESTHESIA 09/20/2008  . RASH AND OTHER NONSPECIFIC SKIN ERUPTION 03/22/2008  . ACUTE BRONCHITIS 02/22/2008  . Nonspecific (abnormal) findings on radiological and other examination of body structure 02/22/2008  . CHEST XRAY, ABNORMAL 02/22/2008  . GERD 09/21/2007  . Essential hypertension 03/23/2007  . LOW BACK PAIN 03/23/2007  . DVT, HX OF 03/23/2007  . Diarrhea 03/18/2007    Current Outpatient Medications  Medication Sig Dispense Refill  . divalproex (DEPAKOTE) 250 MG DR tablet Take 1 tablet (250 mg total) by mouth 3 (three) times daily. 90 tablet 3  . docusate sodium (COLACE) 100 MG capsule Take 1 capsule (100 mg total) by mouth daily as needed for mild constipation.     Marland Kitchen EPINEPHrine 0.3 mg/0.3 mL IJ SOAJ injection As dirrected 1 Device 3  . ferrous sulfate 325 (65 FE) MG tablet Take 1 tablet (325 mg total) by mouth 3 (three) times daily after meals. (Patient taking differently: Take 325 mg by mouth daily with breakfast. )  3  . fish oil-omega-3 fatty acids 1000 MG capsule Take 2 g by mouth daily.     . fluticasone (FLONASE) 50 MCG/ACT nasal spray Place 2 sprays into both nostrils daily. (Patient taking differently: Place 2 sprays into both nostrils daily as needed for allergies or rhinitis. ) 48 g 3  . losartan (COZAAR) 100 MG tablet Take 1 tablet (100 mg total) by mouth daily. 90 tablet 3  . mupirocin ointment (BACTROBAN) 2 % Apply topically daily. Cleanse wound to right lower leg with NS.  Apply mupirocin ointment to wound.  Cover with 4x4 gauze and wrap with kerlix/tape.  Change daily. 22 g 3  . valACYclovir (VALTREX) 1000 MG tablet Take 1 tablet (1,000 mg total) by mouth 2 (two) times daily. 180 tablet 3  . warfarin (COUMADIN) 5 MG tablet Take 1 tablet (5 mg total) by mouth daily at 2 PM. HOLD warfarin today (10/22/17), if INR less than 3, start warfarin 34m daily on 5/10. 30 tablet 0  . zolpidem (AMBIEN) 10 MG tablet 10 mg at hs prn. OK to repeat 5 mg in 4 hrs prn 135 tablet 1   No current facility-administered medications for this visit.     Allergies: Bee venom; Ivp dye [iodinated diagnostic agents]; Shellfish allergy; Temazepam; Amlodipine besylate; Aspirin; Atorvastatin; Cholestyramine; Codeine phosphate; Demerol [meperidine]; Diphenhydramine  hcl; Doxycycline; Gentamicin sulfate; Meclizine hcl; Meperidine hcl; Metronidazole; Moxifloxacin; Sulfamethoxazole; and Penicillins  Past Medical History:  Diagnosis Date  . Adenomatous colon polyp   . Allergic rhinitis    uses FLonase nightly  . Anemia   . Arthritis    joint pain  . Blood transfusion   . Bronchitis    hx of-7-53yr ago  . Carpal tunnel syndrome    numbness and tingling   . Cataracts,  bilateral   . Chronic anticoagulation 03/21/2013  . Congenital absence of one kidney   . Diarrhea    takes Immodium daily as needed or Lomotil   . Diverticulosis   . GERD (gastroesophageal reflux disease)    mild  . Herpes    takes Valtrex daily  . History of colon polyps   . History of DVT (deep vein thrombosis) 1998   right arm  . History of staph infection   . Hyperlipidemia    Medical MD doesn't think its absolutely necessary  . Hypertension    takes Losartan daily  . IBS (irritable bowel syndrome)    takes AElectronics engineerdaily  . Insomnia    takes Ambien nightly as needed  . LBP (low back pain)   . Long term (current) use of anticoagulants    Dr. GBeryle Beams . Nocturia   . Osteoporosis   . Raynaud's disease   . Sarcoma of left lower extremity ([place]    Dr. AWinfield Cunas . Seizures ([place] 10/20/2017  . 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[place] 09/16/2011   Right digital artery 1998 idiopathic   . Urinary leakage   . UTI (urinary tract infection)     Past Surgical History:  Procedure Laterality Date  . ABDOMINAL HYSTERECTOMY    . ANTERIOR CERVICAL DECOMP/DISCECTOMY FUSION N/A 01/18/2014   Procedure: ANTERIOR CERVICAL DECOMPRESSION/DISCECTOMY FUSION 3 LEVELS  Cervical  four/five, five/six, six/seven anterior cervical decompression with fusion interbody prosthesis with plating and bonegraft;  Surgeon: JNewman Pies MD;  [facility];  Service: Neurosurgery;  Laterality: N/A;  t  . APPENDECTOMY    . BACK SURGERY     2  . BREAST SURGERY     cystectomy-benign  . CARPAL TUNNEL RELEASE Right 06/12/2014   Procedure: CARPAL TUNNEL RELEASE;  Surgeon: JNewman Pies MD;  [facility];  Service: Neurosurgery;  Laterality: Right;  Right Carpal Tunnel Release  . CATARACT EXTRACTION Bilateral 02/15/2015  . CHOLECYSTECTOMY    . COLONOSCOPY    . HIP CLOSED REDUCTION Right 08/23/2017   Procedure: CLOSED MANIPULATION HIP;  Surgeon: RNicholes Stairs MD;  Location: [facility];  Service: Orthopedics;  Laterality: Right;  . I&D of abdomen  1988   couple of wks after gallbladder removed  . KNEE SURGERY     left x 3  . LUMBAR LAMINECTOMY     X 2  . mortons neuromas removed    . OPEN SURGICAL REPAIR OF GLUTEAL TENDON Right 06/15/2017   Procedure: OPEN SURGICAL REPAIR OF GLUTEALmedius TENDON;  Surgeon: OParalee Cancel MD;  Location: [facility];  Service: Orthopedics;  Laterality: Right;  . SHOULDER SURGERY     Right  . TOTAL HIP ARTHROPLASTY Right 06/15/2017   Procedure: Right total hip arthroplasty, bursectomy, repair of gluteal tendon, posterior approach;  Surgeon: OParalee Cancel MD;  Location: [facility];  Service: Orthopedics;  Laterality: Right;  90 mins for all procedures together    Family History  Problem Relation Age of Onset  . Breast cancer Sister   .  Kidney disease Sister 84       nephritis  . Parkinsonism Mother   . Heart disease Brother   . Osteoarthritis Other   . Ulcerative colitis Sister   . Colon cancer Neg Hx     Social History   Tobacco Use  . Smoking status: Never Smoker  . Smokeless tobacco: Never Used  Substance Use Topics  . Alcohol use: No    Alcohol/week: 0.0 oz    Subjective:  Patient presents with concerns related to Depakote; she was admitted in early May with TIA/ seizure like activity; she was discharged on Depakote 250 mg tid; patient is very concerned about side effects of the Depakote/ feels like she is taking too much medication; asking that dosage be lowered due to worsening fatigue/ lethargy since starting the medication; is not scheduled to see neurology until July and they have opted not to adjust medications since they have not actually seen the patient; Has also been followed for hyponatremia recently- last reading was 124 last week; no prior history of hyponatremia;     Objective:  Vitals:   11/17/17 1120  BP: (!) 152/84  Pulse: 90  Temp: 97.9 F (36.6 C)  TempSrc: Oral  SpO2: 97%  Weight: 128 lb (58.1 kg)   Height: _0  (1.626 m)    General: Well developed, well nourished, in no acute distress  Skin : Warm and dry.  Head: Normocephalic and atraumatic  Lungs: Respirations unlabored; clear to auscultation bilaterally without wheeze, rales, rhonchi  Neurologic: Alert and oriented; speech intact; face symmetrical; moves all extremities well; CNII-XII intact without focal deficit  Assessment:  1. Hyponatremia   2. Medication management   3. Seizure ([place])     Plan:  ? If Depakote is causing hyponatremia; will update labs today; assuming sodium level and depakote level are stable, will most likely lower depakote dosage to 250 mg bid and try to get her to see neurology as soon as possible; follow-up to be determined.   No follow-ups on file.  Orders Placed This Encounter  Procedures  . Comp Met (CMET)    Standing Status:   Future    Number of Occurrences:   1    Standing Expiration Date:   11/17/2018  . Valproic Acid level    Standing Status:   Future    Number of Occurrences:   1    Standing Expiration Date:   11/17/2018  . Ambulatory referral to Neurology    Referral Priority:   Urgent    Referral Type:   Consultation    Referral Reason:   Specialty Services Required    Requested Specialty:   Neurology    Number of Visits Requested:   1    Requested Prescriptions    No prescriptions requested or ordered in this encounter

## 2017-11-18 ENCOUNTER — Other Ambulatory Visit: Payer: Self-pay | Admitting: Family

## 2017-11-18 DIAGNOSIS — R899 Unspecified abnormal finding in specimens from other organs, systems and tissues: Secondary | ICD-10-CM

## 2017-11-18 LAB — VALPROIC ACID LEVEL: Valproic Acid Lvl: 68.5 mg/L (ref 50.0–100.0)

## 2017-11-20 ENCOUNTER — Telehealth: Payer: Self-pay | Admitting: *Deleted

## 2017-11-20 NOTE — Telephone Encounter (Signed)
Spoke with patient today and info given. 

## 2017-11-20 NOTE — Telephone Encounter (Signed)
Pt returning call; result note read from L. Valere Dross. Pt has additional questions regarding low Depakote levels and decrease in medication; would like to hear from office. States she takes her Depakote at 01-27-2199 so would like call before next dose if she is to decrease.  Please advise: Home: [phone] (call first) Cell: [phone]

## 2017-11-23 ENCOUNTER — Telehealth: Payer: Self-pay | Admitting: Pharmacist

## 2017-11-23 NOTE — Telephone Encounter (Signed)
Called by [facility] RN Debbie from the patient's home reporting results of FS POC INR = 2.10 with no signs or symptoms of VTE or bleeding. Will continue same regimen of 55mg  warfarin/wk. Next INR 1-JUL-19.

## 2017-11-24 ENCOUNTER — Other Ambulatory Visit (INDEPENDENT_AMBULATORY_CARE_PROVIDER_SITE_OTHER): Payer: 59

## 2017-11-24 DIAGNOSIS — R899 Unspecified abnormal finding in specimens from other organs, systems and tissues: Secondary | ICD-10-CM | POA: Diagnosis not present

## 2017-11-24 LAB — BASIC METABOLIC PANEL
BUN: 14 mg/dL (ref 6–23)
CHLORIDE: 93 meq/L — AB (ref 96–112)
CO2: 30 mEq/L (ref 19–32)
CREATININE: 0.78 mg/dL (ref 0.40–1.20)
Calcium: 9.4 mg/dL (ref 8.4–10.5)
GFR: 75.1 mL/min (ref >60.00)
GLUCOSE: 105 mg/dL — AB (ref 70–99)
Potassium: 4.9 mEq/L (ref 3.5–5.1)
Sodium: 129 mEq/L — ABNORMAL LOW (ref 135–145)

## 2017-11-25 LAB — VALPROIC ACID LEVEL: VALPROIC ACID LVL: 44.3 mg/L — AB (ref 50.0–100.0)

## 2017-11-26 ENCOUNTER — Encounter: Payer: Self-pay | Admitting: Internal Medicine

## 2017-11-26 NOTE — Telephone Encounter (Signed)
An appointment has already been made for this on July 29.

## 2017-12-01 ENCOUNTER — Other Ambulatory Visit (INDEPENDENT_AMBULATORY_CARE_PROVIDER_SITE_OTHER): Payer: 59

## 2017-12-01 DIAGNOSIS — G4700 Insomnia, unspecified: Secondary | ICD-10-CM

## 2017-12-01 LAB — AMMONIA: Ammonia: 36 umol/L — ABNORMAL HIGH (ref 11–35)

## 2017-12-11 DIAGNOSIS — M25551 Pain in right hip: Secondary | ICD-10-CM | POA: Diagnosis not present

## 2017-12-11 DIAGNOSIS — M7061 Trochanteric bursitis, right hip: Secondary | ICD-10-CM | POA: Diagnosis not present

## 2017-12-11 DIAGNOSIS — Z96641 Presence of right artificial hip joint: Secondary | ICD-10-CM | POA: Diagnosis not present

## 2017-12-14 ENCOUNTER — Ambulatory Visit (INDEPENDENT_AMBULATORY_CARE_PROVIDER_SITE_OTHER): Payer: 59 | Admitting: Pharmacist

## 2017-12-14 DIAGNOSIS — Z86718 Personal history of other venous thrombosis and embolism: Secondary | ICD-10-CM | POA: Diagnosis not present

## 2017-12-14 DIAGNOSIS — Z5181 Encounter for therapeutic drug level monitoring: Secondary | ICD-10-CM

## 2017-12-14 DIAGNOSIS — I742 Embolism and thrombosis of arteries of the upper extremities: Secondary | ICD-10-CM

## 2017-12-14 DIAGNOSIS — D6859 Other primary thrombophilia: Secondary | ICD-10-CM

## 2017-12-14 DIAGNOSIS — Z7901 Long term (current) use of anticoagulants: Secondary | ICD-10-CM | POA: Diagnosis not present

## 2017-12-14 LAB — POCT INR: INR: 3.1 — AB (ref 2.0–3.0)

## 2017-12-14 NOTE — Progress Notes (Signed)
Reviewed Thanks I would have just repeated & not decreased dose at 0.1 over target DrG

## 2017-12-14 NOTE — Patient Instructions (Signed)
Patient instructed to take medications as defined in the Anti-coagulation Track section of this encounter.  Patient instructed to take today's dose.  Patient instructed to take one (1) tablet of your 5mg  peach-colored warfarin tablets on Mondays and Thursdays; all other days, take 1 & 1/2 tablets of your 5mg  peach-colored warfarin tablets.  Patient verbalized understanding of these instructions.

## 2017-12-14 NOTE — Progress Notes (Signed)
Anticoagulation Management Tracy Peters is a 84 y.o. female who reports to the clinic for monitoring of warfarin treatment.    Indication: DVT, history of; long term current use of anticoagulants.  Duration: indefinite Supervising physician: Murriel Hopper  Anticoagulation Clinic Visit History: Patient does not report signs/symptoms of bleeding or thromboembolism  Other recent changes: No diet, medications, lifestyle changes endorsed.  Anticoagulation Episode Summary    Current INR goal:   2.0-3.0  TTR:   69.8 % (6.4 y)  Next INR check:   12/28/2017  INR from last check:   3.1! (12/14/2017)  Weekly max warfarin dose:     Target end date:   Indefinite  INR check location:   Anticoagulation Clinic  Preferred lab:     Send INR reminders to:   ANT[facility]   Indications   Chronic anticoagulation [Z79.01] DVT HX OF [Z86.718] 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HCC) [I74.2] Primary hypercoagulable state ([place]) [D68.59] [D68.59] Long term (current) use of anticoagulants [Z79.01] [Z79.01]       Comments:         Anticoagulation Care Providers    Provider Role Specialty Phone number   Annia Belt, MD Referring Oncology [phone]      Allergies  Allergen Reactions  . Bee Venom Anaphylaxis and Other (See Comments)    Yellow jackets, wasps as well  . Ivp Dye [Iodinated Diagnostic Agents] Anaphylaxis  . Shellfish Allergy Anaphylaxis  . Temazepam Other (See Comments)    Severe Hallucinations and Paranoia  . Amlodipine Besylate Swelling  . Aspirin Other (See Comments)    REACTION: unspecified  . Atorvastatin Diarrhea  . Cholestyramine Other (See Comments)    REACTION: mouth irritation  . Codeine Phosphate Nausea And Vomiting  . Demerol [Meperidine] Other (See Comments)    Severe Hallucination!!!!  . Diphenhydramine Hcl Other (See Comments)    hyperactive  . Doxycycline Nausea Only  . Gentamicin Sulfate Other (See Comments)    REACTION: unspecified  .  Meclizine Hcl Other (See Comments)    REACTION: more dizziness on it  . Meperidine Hcl Other (See Comments)    Unknown  . Metronidazole Other (See Comments)    REACTION: bad taste  . Moxifloxacin Other (See Comments)    REACTION: insomnia Able to use Cipro  . Sulfamethoxazole Other (See Comments)    Kidney problems  . Penicillins Swelling, Rash and Other (See Comments)    Has patient had a PCN reaction causing immediate rash, facial/tongue/throat swelling, SOB or lightheadedness with hypotension: Yes Has patient had a PCN reaction causing severe rash involving mucus membranes or skin necrosis: No Has patient had a PCN reaction that required hospitalization: No Has patient had a PCN reaction occurring within the last 10 years: No If all of the above answers are "NO", then may proceed with Cephalosporin use.    Prior to Admission medications   Medication Sig Start Date End Date Taking? Authorizing Provider  divalproex (DEPAKOTE) 250 MG DR tablet Take 1 tablet (250 mg total) by mouth 3 (three) times daily. 10/22/17  Yes Rai, Ripudeep K, MD  docusate sodium (COLACE) 100 MG capsule Take 1 capsule (100 mg total) by mouth daily as needed for mild constipation. 10/22/17  Yes Rai, Ripudeep K, MD  EPINEPHrine 0.3 mg/0.3 mL IJ SOAJ injection As dirrected 01/27/17  Yes Plotnikov, Evie Lacks, MD  ferrous sulfate 325 (65 FE) MG tablet Take 1 tablet (325 mg total) by mouth 3 (three) times daily after meals. Patient taking differently: Take  325 mg by mouth daily with breakfast.  06/18/17  Yes Babish, Rodman Key, PA-C  fish oil-omega-3 fatty acids 1000 MG capsule Take 2 g by mouth daily.    Yes [provider]  fluticasone (FLONASE) 50 MCG/ACT nasal spray Place 2 sprays into both nostrils daily. Patient taking differently: Place 2 sprays into both nostrils daily as needed for allergies or rhinitis.  07/29/17  Yes Plotnikov, Evie Lacks, MD  losartan (COZAAR) 100 MG tablet TAKE 1 TABLET BY MOUTH DAILY. 11/18/17   Yes Plotnikov, Evie Lacks, MD  mupirocin ointment (BACTROBAN) 2 % Apply topically daily. Cleanse wound to right lower leg with NS.  Apply mupirocin ointment to wound.  Cover with 4x4 gauze and wrap with kerlix/tape.  Change daily. 10/23/17  Yes Rai, Ripudeep K, MD  valACYclovir (VALTREX) 1000 MG tablet Take 1 tablet (1,000 mg total) by mouth 2 (two) times daily. 01/27/17  Yes Plotnikov, Evie Lacks, MD  warfarin (COUMADIN) 5 MG tablet Take 1 tablet (5 mg total) by mouth daily at 2 PM. HOLD warfarin today (10/22/17), if INR less than 3, start warfarin 5mg  daily on 5/10. 10/23/17  Yes Rai, Ripudeep K, MD  zolpidem (AMBIEN) 10 MG tablet 10 mg at hs prn. OK to repeat 5 mg in 4 hrs prn 10/30/17  Yes Plotnikov, Evie Lacks, MD   Past Medical History:  Diagnosis Date  . Adenomatous colon polyp   . Allergic rhinitis    uses FLonase nightly  . Anemia   . Arthritis    joint pain  . Blood transfusion   . Bronchitis    hx of-7-7yrs ago  . Carpal tunnel syndrome    numbness and tingling   . Cataracts, bilateral   . Chronic anticoagulation 03/21/2013  . Congenital absence of one kidney   . Diarrhea    takes Immodium daily as needed or Lomotil   . Diverticulosis   . GERD (gastroesophageal reflux disease)    mild  . Herpes    takes Valtrex daily  . History of colon polyps   . History of DVT (deep vein thrombosis) 1998   right arm  . History of staph infection   . Hyperlipidemia    Medical MD doesn't think its absolutely necessary  . Hypertension    takes Losartan daily  . IBS (irritable bowel syndrome)    takes Electronics engineer daily  . Insomnia    takes Ambien nightly as needed  . LBP (low back pain)   . Long term (current) use of anticoagulants    Dr. Beryle Beams  . Nocturia   . Osteoporosis   . Raynaud's disease   . 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 Winfield Cunas  . Seizures ([place]) 10/20/2017  . 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 Urinary  leakage   . UTI (urinary tract infection)    Social History   Socioeconomic History  . Marital status: Married    Spouse name: Not on file  . Number of children: 2  . Years of education: Not on file  . Highest education level: Not on file  Occupational History  . Occupation: Retired    Fish farm manager: RETIRED  Social Needs  . Financial resource strain: Not on file  . Food insecurity:    Worry: Not on file    Inability: Not on file  . Transportation needs:    Medical: Not on file    Non-medical: Not on file  Tobacco Use  . Smoking status: Never Smoker  . Smokeless tobacco: Never Used  Substance and Sexual Activity  . Alcohol use: No    Alcohol/week: 0.0 oz  . Drug use: No  . Sexual activity: Never  Lifestyle  . Physical activity:    Days per week: Not on file    Minutes per session: Not on file  . Stress: Not on file  Relationships  . Social connections:    Talks on phone: Not on file    Gets together: Not on file    Attends religious service: Not on file    Active member of club or organization: Not on file    Attends meetings of clubs or organizations: Not on file    Relationship status: Not on file  Other Topics Concern  . Not on file  Social History Narrative  . Not on file   Family History  Problem Relation Age of Onset  . Breast cancer Sister   . Kidney disease Sister 38       nephritis  . Parkinsonism Mother   . Heart disease Brother   . Osteoarthritis Other   . Ulcerative colitis Sister   . Colon cancer Neg Hx     ASSESSMENT Recent Results: The most recent result is correlated with 55 mg per week: Lab Results  Component Value Date   INR 3.1 (A) 12/14/2017   INR 2.1 11/16/2017   INR 2.20 10/26/2017   PROTIME 39.6 (H) 04/03/2015    Anticoagulation Dosing: Description   Take one (1) tablet of your 5mg  peach-colored warfarin tablets on Mondays and Thursdays; all other days, take 1 & 1/2 tablets of your 5mg  peach-colored warfarin tablets.   DR  Azucena Freed PATIENT (route warfarin notes and list as authorizing provider for LOS & Follow-up, lab orders and warfarin prescriptions), do not list attending physician      INR today: Supratherapeutic  PLAN Weekly dose was decreased by 13% to 47.5 mg per week  Patient Instructions  Patient instructed to take medications as defined in the Anti-coagulation Track section of this encounter.  Patient instructed to take today's dose.  Patient instructed to take one (1) tablet of your 5mg  peach-colored warfarin tablets on Mondays and Thursdays; all other days, take 1 & 1/2 tablets of your 5mg  peach-colored warfarin tablets.  Patient verbalized understanding of these instructions.     Patient advised to contact clinic or seek medical attention if signs/symptoms of bleeding or thromboembolism occur.  Patient verbalized understanding by repeating back information and was advised to contact me if further medication-related questions arise. Patient was also provided an information handout.  Follow-up Return in about 2 weeks (around 12/28/2017) for Follow up INR at 1130h.  Pennie Banter, PharmD, CACP, CPP  15 minutes spent face-to-face with the patient during the encounter. 50% of time spent on education. 50% of time was spent on fingerstick point of care INR sample collection, processing, results determination, dose adjustment and documentation in [email or web address].

## 2017-12-16 ENCOUNTER — Other Ambulatory Visit (INDEPENDENT_AMBULATORY_CARE_PROVIDER_SITE_OTHER): Payer: 59

## 2017-12-16 ENCOUNTER — Ambulatory Visit (INDEPENDENT_AMBULATORY_CARE_PROVIDER_SITE_OTHER): Payer: 59 | Admitting: Internal Medicine

## 2017-12-16 ENCOUNTER — Encounter: Payer: Self-pay | Admitting: Internal Medicine

## 2017-12-16 ENCOUNTER — Ambulatory Visit: Payer: 59 | Admitting: Internal Medicine

## 2017-12-16 DIAGNOSIS — R569 Unspecified convulsions: Secondary | ICD-10-CM

## 2017-12-16 DIAGNOSIS — D500 Iron deficiency anemia secondary to blood loss (chronic): Secondary | ICD-10-CM

## 2017-12-16 DIAGNOSIS — Z7901 Long term (current) use of anticoagulants: Secondary | ICD-10-CM | POA: Diagnosis not present

## 2017-12-16 DIAGNOSIS — I10 Essential (primary) hypertension: Secondary | ICD-10-CM

## 2017-12-16 DIAGNOSIS — I742 Embolism and thrombosis of arteries of the upper extremities: Secondary | ICD-10-CM

## 2017-12-16 DIAGNOSIS — E871 Hypo-osmolality and hyponatremia: Secondary | ICD-10-CM

## 2017-12-16 DIAGNOSIS — R945 Abnormal results of liver function studies: Secondary | ICD-10-CM

## 2017-12-16 DIAGNOSIS — R531 Weakness: Secondary | ICD-10-CM

## 2017-12-16 DIAGNOSIS — R7989 Other specified abnormal findings of blood chemistry: Secondary | ICD-10-CM

## 2017-12-16 LAB — CBC
HEMATOCRIT: 36.3 % (ref 36.0–46.0)
HEMOGLOBIN: 12.7 g/dL (ref 12.0–15.0)
MCHC: 35 g/dL (ref 30.0–36.0)
PLATELETS: 299 10*3/uL (ref 150.0–400.0)
RBC: 3.24 Mil/uL — AB (ref 3.87–5.11)
RDW: 15.4 % (ref 11.5–15.5)
WBC: 6.4 10*3/uL (ref 4.0–10.5)

## 2017-12-16 LAB — BASIC METABOLIC PANEL
BUN: 13 mg/dL (ref 6–23)
CALCIUM: 9.6 mg/dL (ref 8.4–10.5)
CO2: 29 mEq/L (ref 19–32)
CREATININE: 0.71 mg/dL (ref 0.40–1.20)
Chloride: 94 mEq/L — ABNORMAL LOW (ref 96–112)
GFR: 83.7 mL/min (ref >60.00)
Glucose, Bld: 90 mg/dL (ref 70–99)
Potassium: 5 mEq/L (ref 3.5–5.1)
Sodium: 129 mEq/L — ABNORMAL LOW (ref 135–145)

## 2017-12-16 LAB — HEPATIC FUNCTION PANEL
ALBUMIN: 4 g/dL (ref 3.5–5.2)
ALT: 74 U/L — AB (ref 0–35)
AST: 42 U/L — AB (ref 0–37)
Alkaline Phosphatase: 48 U/L (ref 39–117)
BILIRUBIN TOTAL: 0.6 mg/dL (ref 0.2–1.2)
Bilirubin, Direct: 0.1 mg/dL (ref 0.0–0.3)
TOTAL PROTEIN: 7.1 g/dL (ref 6.0–8.3)

## 2017-12-16 MED ORDER — DIVALPROEX SODIUM 250 MG PO DR TAB: DELAYED_RELEASE_TABLET | ORAL | 3 refills | Status: DC

## 2017-12-16 MED ORDER — LOSARTAN POTASSIUM 100 MG PO TABS: 100.0000 mg | ORAL_TABLET | Freq: Every day | ORAL | 3 refills | Status: DC

## 2017-12-16 MED ORDER — WARFARIN SODIUM 5 MG PO TABS: 5.0000 mg | ORAL_TABLET | Freq: Every day | ORAL | 3 refills | Status: DC

## 2017-12-16 NOTE — Assessment & Plan Note (Signed)
On less Depakote -  Fatigue/weakness is better

## 2017-12-16 NOTE — Assessment & Plan Note (Signed)
On less Depakote -  Fatigue/weakness is better Fluid restriction

## 2017-12-16 NOTE — Progress Notes (Signed)
Subjective:  Patient ID: Tracy Peters, female    DOB: 07-Dec-1933  Age: 84 y.o. MRN: [id number]  CC: No chief complaint on file.   HPI Tracy Peters presents for seizure disorder, low Na, fatigue, dizziness - better on lower Depakote dose (1/2 tab in am and 1 at night). The hip is ok  Outpatient Medications Prior to Visit  Medication Sig Dispense Refill  . divalproex (DEPAKOTE) 250 MG DR tablet Take 1 tablet (250 mg total) by mouth 3 (three) times daily. (Patient taking differently: Take 250 mg by mouth 2 (two) times daily. ) 90 tablet 3  . docusate sodium (COLACE) 100 MG capsule Take 1 capsule (100 mg total) by mouth daily as needed for mild constipation.    Marland Kitchen EPINEPHrine 0.3 mg/0.3 mL IJ SOAJ injection As dirrected 1 Device 3  . ferrous sulfate 325 (65 FE) MG tablet Take 1 tablet (325 mg total) by mouth 3 (three) times daily after meals. (Patient taking differently: Take 325 mg by mouth every other day. )  3  . fish oil-omega-3 fatty acids 1000 MG capsule Take 2 g by mouth daily.     . fluticasone (FLONASE) 50 MCG/ACT nasal spray Place 2 sprays into both nostrils daily. (Patient taking differently: Place 2 sprays into both nostrils daily as needed for allergies or rhinitis. ) 48 g 3  . losartan (COZAAR) 100 MG tablet TAKE 1 TABLET BY MOUTH DAILY. 90 tablet 3  . mupirocin ointment (BACTROBAN) 2 % Apply topically daily. Cleanse wound to right lower leg with NS.  Apply mupirocin ointment to wound.  Cover with 4x4 gauze and wrap with kerlix/tape.  Change daily. 22 g 3  . valACYclovir (VALTREX) 1000 MG tablet Take 1 tablet (1,000 mg total) by mouth 2 (two) times daily. 180 tablet 3  . warfarin (COUMADIN) 5 MG tablet Take 1 tablet (5 mg total) by mouth daily at 2 PM. HOLD warfarin today (10/22/17), if INR less than 3, start warfarin 5mg  daily on 5/10. 30 tablet 0  . zolpidem (AMBIEN) 10 MG tablet 10 mg at hs prn. OK to repeat 5 mg in 4 hrs prn 135 tablet 1  . HYDROcodone-acetaminophen  (NORCO/VICODIN) 5-325 MG tablet      No facility-administered medications prior to visit.     ROS: Review of Systems  Constitutional: Positive for fatigue. Negative for activity change, appetite change, chills and unexpected weight change.  HENT: Negative for congestion, mouth sores and sinus pressure.   Eyes: Negative for visual disturbance.  Respiratory: Negative for cough and chest tightness.   Gastrointestinal: Negative for abdominal pain and nausea.  Genitourinary: Negative for difficulty urinating, frequency and vaginal pain.  Musculoskeletal: Positive for back pain and gait problem.  Skin: Negative for pallor and rash.  Neurological: Negative for dizziness, tremors, weakness, numbness and headaches.  Psychiatric/Behavioral: Negative for confusion and sleep disturbance.    Objective:  BP 130/90 (BP Location: Left Arm, Patient Position: Sitting, Cuff Size: Normal)   Pulse 72   Ht 5\' 4"  (1.626 m)   Wt 127 lb (57.6 kg)   SpO2 98%   BMI 21.80 kg/m   BP Readings from Last 3 Encounters:  12/16/17 130/90  11/17/17 (!) 152/84  10/30/17 (!) 146/88    Wt Readings from Last 3 Encounters:  12/16/17 127 lb (57.6 kg)  11/17/17 128 lb (58.1 kg)  10/30/17 126 lb (57.2 kg)    Physical Exam  Constitutional: She appears well-developed. No distress.  HENT:  Head: Normocephalic.  Right Ear: External ear normal.  Left Ear: External ear normal.  Nose: Nose normal.  Mouth/Throat: Oropharynx is clear and moist.  Eyes: Pupils are equal, round, and reactive to light. Conjunctivae are normal. Right eye exhibits no discharge. Left eye exhibits no discharge.  Neck: Normal range of motion. Neck supple. No JVD present. No tracheal deviation present. No thyromegaly present.  Cardiovascular: Normal rate, regular rhythm and normal heart sounds.  Pulmonary/Chest: No stridor. No respiratory distress. She has no wheezes.  Abdominal: Soft. Bowel sounds are normal. She exhibits no distension and no  mass. There is no tenderness. There is no rebound and no guarding.  Musculoskeletal: She exhibits no edema or tenderness.  Lymphadenopathy:    She has no cervical adenopathy.  Neurological: She displays normal reflexes. No cranial nerve deficit. She exhibits normal muscle tone. Coordination normal.  Skin: No rash noted. No erythema.  Psychiatric: She has a normal mood and affect. Her behavior is normal. Judgment and thought content normal.    Lab Results  Component Value Date   WBC 6.1 10/30/2017   HGB 13.1 10/30/2017   HCT 39.1 10/30/2017   PLT 424.0 (H) 10/30/2017   GLUCOSE 105 (H) 11/24/2017   CHOL 196 01/03/2016   TRIG 85.0 01/03/2016   HDL 63.10 01/03/2016   LDLDIRECT 129.8 05/31/2012   LDLCALC 116 (H) 01/03/2016   ALT 13 11/17/2017   AST 19 11/17/2017   NA 129 (L) 11/24/2017   K 4.9 11/24/2017   CL 93 (L) 11/24/2017   CREATININE 0.78 11/24/2017   BUN 14 11/24/2017   CO2 30 11/24/2017   TSH 2.749 10/20/2017   INR 3.1 (A) 12/14/2017    Ct Head Wo Contrast  Result Date: 10/20/2017 CLINICAL DATA:  84 year old female with dizziness and facial droop. EXAM: CT HEAD WITHOUT CONTRAST TECHNIQUE: Contiguous axial images were obtained from the base of the skull through the vertex without intravenous contrast. COMPARISON:  Head CT dated 09/30/2016 FINDINGS: Brain: There is mild age-related atrophy and chronic microvascular ischemic changes. There is no acute intracranial hemorrhage. No mass effect or midline shift. No extra-axial fluid collection. Vascular: No hyperdense vessel or unexpected calcification. Skull: Normal. Negative for fracture or focal lesion. Sinuses/Orbits: No acute finding. Other: None IMPRESSION: 1. No acute intracranial pathology. 2. Age-related atrophy and chronic microvascular ischemic changes. Electronically Signed   By: Anner Crete M.D.   On: 10/20/2017 01:49   Mr Jeri Cos JA Contrast  Result Date: 10/20/2017 CLINICAL DATA:  Brief loss of consciousness.  Confusion. Speech disturbance. EXAM: MRI HEAD WITHOUT AND WITH CONTRAST TECHNIQUE: Multiplanar, multiecho pulse sequences of the brain and surrounding structures were obtained without and with intravenous contrast. CONTRAST:  107mL MULTIHANCE GADOBENATE DIMEGLUMINE 529 MG/ML IV SOLN COMPARISON:  CT 10/20/2017.  MRI 10/12/2013. FINDINGS: Brain: Diffusion imaging does not show any acute or subacute infarction. Brainstem and cerebellum are normal. Cerebral hemispheres show age related atrophy with moderate chronic small-vessel ischemic changes throughout the deep and subcortical white matter. No cortical or large vessel territory infarction. No mass lesion, hemorrhage, hydrocephalus or extra-axial collection. After contrast administration, no abnormal enhancement occurs. Vascular: Major vessels at the base of the brain show flow. Skull and upper cervical spine: Negative Sinuses/Orbits: Clear/normal Other: None IMPRESSION: No acute or reversible finding. Age related atrophy. Moderate chronic small-vessel ischemic changes of the cerebral hemispheric white matter. Electronically Signed   By: Nelson Chimes M.D.   On: 10/20/2017 13:16   Dg Chest Port 1 View  Result Date: 10/20/2017  CLINICAL DATA:  84 y/o  F; seizure. EXAM: PORTABLE CHEST 1 VIEW COMPARISON:  04/17/2016 chest radiograph FINDINGS: Stable normal cardiac silhouette given projection and technique. Aortic atherosclerosis with calcification. Clear lungs. No pleural effusion or pneumothorax. No acute osseous abnormality is evident. Partially visualized anterior cervical fusion hardware. IMPRESSION: No active disease. Electronically Signed   By: Kristine Garbe M.D.   On: 10/20/2017 06:21   Dg Hips Bilat W Or Wo Pelvis 5 Views  Result Date: 10/20/2017 CLINICAL DATA:  84 year old female with fall. EXAM: DG HIP (WITH OR WITHOUT PELVIS) 5+V BILAT COMPARISON:  None. FINDINGS: There is no acute fracture or dislocation. The bones are osteopenic. There is a  total right hip arthroplasty. The arthroplasty components appear intact and in anatomic alignment. The soft tissues are grossly unremarkable. IMPRESSION: No acute fracture or dislocation. Electronically Signed   By: Anner Crete M.D.   On: 10/20/2017 03:00    Assessment & Plan:   There are no diagnoses linked to this encounter.   No orders of the defined types were placed in this encounter.    Follow-up: No follow-ups on file.  [name], MD

## 2017-12-16 NOTE — Assessment & Plan Note (Signed)
No relapse On less Depakote -  Fatigue/weakness is better Not driving x 6 mo Neurol appt pending

## 2017-12-16 NOTE — Assessment & Plan Note (Signed)
CBC

## 2017-12-16 NOTE — Assessment & Plan Note (Signed)
Coumadin 

## 2017-12-17 ENCOUNTER — Other Ambulatory Visit: Payer: Self-pay | Admitting: Internal Medicine

## 2017-12-17 MED ORDER — DIVALPROEX SODIUM 250 MG PO DR TAB: DELAYED_RELEASE_TABLET | ORAL | 3 refills | Status: DC

## 2017-12-22 DIAGNOSIS — R7989 Other specified abnormal findings of blood chemistry: Secondary | ICD-10-CM | POA: Insufficient documentation

## 2017-12-22 DIAGNOSIS — R945 Abnormal results of liver function studies: Secondary | ICD-10-CM

## 2017-12-22 NOTE — Assessment & Plan Note (Signed)
Monitor creatinine 

## 2017-12-22 NOTE — Assessment & Plan Note (Signed)
likely due to Depakote - reduce the dose

## 2017-12-28 ENCOUNTER — Ambulatory Visit (INDEPENDENT_AMBULATORY_CARE_PROVIDER_SITE_OTHER): Payer: 59 | Admitting: Pharmacist

## 2017-12-28 DIAGNOSIS — Z86718 Personal history of other venous thrombosis and embolism: Secondary | ICD-10-CM | POA: Diagnosis not present

## 2017-12-28 DIAGNOSIS — I742 Embolism and thrombosis of arteries of the upper extremities: Secondary | ICD-10-CM

## 2017-12-28 DIAGNOSIS — D6859 Other primary thrombophilia: Secondary | ICD-10-CM | POA: Diagnosis not present

## 2017-12-28 DIAGNOSIS — Z7901 Long term (current) use of anticoagulants: Secondary | ICD-10-CM | POA: Diagnosis not present

## 2017-12-28 DIAGNOSIS — Z5181 Encounter for therapeutic drug level monitoring: Secondary | ICD-10-CM

## 2017-12-28 LAB — POCT INR: INR: 2.3 (ref 2.0–3.0)

## 2017-12-28 NOTE — Progress Notes (Signed)
Anticoagulation Management Tracy Peters is a 84 y.o. female who reports to the clinic for monitoring of warfarin treatment.    Indication: Chronic anticoagulation; thrombosis of right radial artery; primary hypercoagulable state.   Duration: indefinite Supervising physician: Murriel Hopper  Anticoagulation Clinic Visit History: Patient does not report signs/symptoms of bleeding or thromboembolism  Other recent changes: No diet, medications, lifestyle changes endorsed.  Anticoagulation Episode Summary    Current INR goal:   2.0-3.0  TTR:   69.9 % (6.5 y)  Next INR check:   01/25/2018  INR from last check:   2.3 (12/28/2017)  Weekly max warfarin dose:     Target end date:   Indefinite  INR check location:   Anticoagulation Clinic  Preferred lab:     Send INR reminders to:   [facility]   Indications   Chronic anticoagulation [Z79.01] DVT HX OF [Z86.718] 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HCC) [I74.2] Primary hypercoagulable state ([place]) [D68.59] [D68.59] Long term (current) use of anticoagulants [Z79.01] [Z79.01]       Comments:         Anticoagulation Care Providers    Provider Role Specialty Phone number   Annia Belt, MD Referring Oncology [phone]      Allergies  Allergen Reactions  . Bee Venom Anaphylaxis and Other (See Comments)    Yellow jackets, wasps as well  . Ivp Dye [Iodinated Diagnostic Agents] Anaphylaxis  . Shellfish Allergy Anaphylaxis  . Temazepam Other (See Comments)    Severe Hallucinations and Paranoia  . Amlodipine Besylate Swelling  . Aspirin Other (See Comments)    REACTION: unspecified  . Atorvastatin Diarrhea  . Cholestyramine Other (See Comments)    REACTION: mouth irritation  . Codeine Phosphate Nausea And Vomiting  . Demerol [Meperidine] Other (See Comments)    Severe Hallucination!!!!  . Diphenhydramine Hcl Other (See Comments)    hyperactive  . Doxycycline Nausea Only  . Gentamicin Sulfate Other (See Comments)    REACTION: unspecified  . Meclizine Hcl Other (See Comments)    REACTION: more dizziness on it  . Meperidine Hcl Other (See Comments)    Unknown  . Metronidazole Other (See Comments)    REACTION: bad taste  . Moxifloxacin Other (See Comments)    REACTION: insomnia Able to use Cipro  . Sulfamethoxazole Other (See Comments)    Kidney problems  . Penicillins Swelling, Rash and Other (See Comments)    Has patient had a PCN reaction causing immediate rash, facial/tongue/throat swelling, SOB or lightheadedness with hypotension: Yes Has patient had a PCN reaction causing severe rash involving mucus membranes or skin necrosis: No Has patient had a PCN reaction that required hospitalization: No Has patient had a PCN reaction occurring within the last 10 years: No If all of the above answers are "NO", then may proceed with Cephalosporin use.    Prior to Admission medications   Medication Sig Start Date End Date Taking? Authorizing Provider  divalproex (DEPAKOTE) 250 MG DR tablet 0.5 tab bid 12/17/17  Yes Plotnikov, Evie Lacks, MD  docusate sodium (COLACE) 100 MG capsule Take 1 capsule (100 mg total) by mouth daily as needed for mild constipation. 10/22/17  Yes Rai, Ripudeep K, MD  EPINEPHrine 0.3 mg/0.3 mL IJ SOAJ injection As dirrected 01/27/17  Yes Plotnikov, Evie Lacks, MD  ferrous sulfate 325 (65 FE) MG tablet Take 1 tablet (325 mg total) by mouth 3 (three) times daily after meals. Patient taking differently: Take 325 mg by mouth every other day.  06/18/17  Yes Babish, Rodman Key, PA-C  fish oil-omega-3 fatty acids 1000 MG capsule Take 2 g by mouth daily.    Yes [provider]  fluticasone (FLONASE) 50 MCG/ACT nasal spray Place 2 sprays into both nostrils daily. Patient taking differently: Place 2 sprays into both nostrils daily as needed for allergies or rhinitis.  07/29/17  Yes Plotnikov, Evie Lacks, MD  HYDROcodone-acetaminophen (NORCO/VICODIN) 5-325 MG tablet  12/11/17  Yes [provider]  losartan (COZAAR) 100 MG tablet Take 1 tablet (100 mg total) by mouth daily. 12/16/17  Yes Plotnikov, Evie Lacks, MD  mupirocin ointment (BACTROBAN) 2 % Apply topically daily. Cleanse wound to right lower leg with NS.  Apply mupirocin ointment to wound.  Cover with 4x4 gauze and wrap with kerlix/tape.  Change daily. 10/23/17  Yes Rai, Ripudeep K, MD  warfarin (COUMADIN) 5 MG tablet Take 1 tablet (5 mg total) by mouth daily at 2 PM. HOLD warfarin today (10/22/17), if INR less than 3, start warfarin 5mg  daily on 5/10. 12/16/17  Yes Plotnikov, Evie Lacks, MD  zolpidem (AMBIEN) 10 MG tablet 10 mg at hs prn. OK to repeat 5 mg in 4 hrs prn 10/30/17  Yes Plotnikov, Evie Lacks, MD  valACYclovir (VALTREX) 1000 MG tablet Take 1 tablet (1,000 mg total) by mouth 2 (two) times daily. Patient not taking: Reported on 12/28/2017 01/27/17   Plotnikov, Evie Lacks, MD   Past Medical History:  Diagnosis Date  . Adenomatous colon polyp   . Allergic rhinitis    uses FLonase nightly  . Anemia   . Arthritis    joint pain  . Blood transfusion   . Bronchitis    hx of-7-89yrs ago  . Carpal tunnel syndrome    numbness and tingling   . Cataracts, bilateral   . Chronic anticoagulation 03/21/2013  . Congenital absence of one kidney   . Diarrhea    takes Immodium daily as needed or Lomotil   . Diverticulosis   . GERD (gastroesophageal reflux disease)    mild  . Herpes    takes Valtrex daily  . History of colon polyps   . History of DVT (deep vein thrombosis) 1998   right arm  . History of staph infection   . Hyperlipidemia    Medical MD doesn't think its absolutely necessary  . Hypertension    takes Losartan daily  . IBS (irritable bowel syndrome)    takes Electronics engineer daily  . Insomnia    takes Ambien nightly as needed  . LBP (low back pain)   . Long term (current) use of anticoagulants    Dr. Beryle Beams  . Nocturia   . Osteoporosis   . Raynaud's disease   . 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  Winfield Cunas  . Seizures ([place]) 10/20/2017  . 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 Urinary leakage   . UTI (urinary tract infection)    Social History   Socioeconomic History  . Marital status: Married    Spouse name: Not on file  . Number of children: 2  . Years of education: Not on file  . Highest education level: Not on file  Occupational History  . Occupation: Retired    Fish farm manager: RETIRED  Social Needs  . Financial resource strain: Not on file  . Food insecurity:    Worry: Not on file    Inability: Not on file  . Transportation needs:  Medical: Not on file    Non-medical: Not on file  Tobacco Use  . Smoking status: Never Smoker  . Smokeless tobacco: Never Used  Substance and Sexual Activity  . Alcohol use: No    Alcohol/week: 0.0 oz  . Drug use: No  . Sexual activity: Never  Lifestyle  . Physical activity:    Days per week: Not on file    Minutes per session: Not on file  . Stress: Not on file  Relationships  . Social connections:    Talks on phone: Not on file    Gets together: Not on file    Attends religious service: Not on file    Active member of club or organization: Not on file    Attends meetings of clubs or organizations: Not on file    Relationship status: Not on file  Other Topics Concern  . Not on file  Social History Narrative  . Not on file   Family History  Problem Relation Age of Onset  . Breast cancer Sister   . Kidney disease Sister 53       nephritis  . Parkinsonism Mother   . Heart disease Brother   . Osteoarthritis Other   . Ulcerative colitis Sister   . Colon cancer Neg Hx     ASSESSMENT Recent Results: The most recent result is correlated with 47.5 mg per week: Lab Results  Component Value Date   INR 2.3 12/28/2017   INR 3.1 (A) 12/14/2017   INR 2.1 11/16/2017   PROTIME 39.6 (H) 04/03/2015    Anticoagulation Dosing: Description   Take one (1) tablet of your 5mg   peach-colored warfarin tablets on Mondays and Thursdays; all other days, take 1 & 1/2 tablets of your 5mg  peach-colored warfarin tablets.   DR Azucena Freed PATIENT (route warfarin notes and list as authorizing provider for LOS & Follow-up, lab orders and warfarin prescriptions), do not list attending physician      INR today: Therapeutic  PLAN Weekly dose was unchanged.  Patient Instructions  Patient instructed to take medications as defined in the Anti-coagulation Track section of this encounter.  Patient instructed to take today's dose.  Patient instructed to take one (1) tablet of your 5mg  peach-colored warfarin tablets on Mondays and Thursdays; all other days, take 1 & 1/2 tablets of your 5mg  peach-colored warfarin tablets. Patient verbalized understanding of these instructions.     Patient advised to contact clinic or seek medical attention if signs/symptoms of bleeding or thromboembolism occur.  Patient verbalized understanding by repeating back information and was advised to contact me if further medication-related questions arise. Patient was also provided an information handout.  Follow-up Return in 1 month (on 01/25/2018) for Follow up INR at 1145h.  Pennie Banter, PharmD, CACP, CPP  15 minutes spent face-to-face with the patient during the encounter. 50% of time spent on education. 50% of time was spent on fingerstick point of care INR sample collection, processing, results determination, and documentation in [email or web address].

## 2017-12-28 NOTE — Progress Notes (Signed)
Reviewed thx DrG 

## 2017-12-28 NOTE — Patient Instructions (Signed)
Patient instructed to take medications as defined in the Anti-coagulation Track section of this encounter.  Patient instructed to take today's dose.  Patient instructed to take one (1) tablet of your 5mg  peach-colored warfarin tablets on Mondays and Thursdays; all other days, take 1 & 1/2 tablets of your 5mg  peach-colored warfarin tablets. Patient verbalized understanding of these instructions.

## 2018-01-04 ENCOUNTER — Encounter: Payer: Self-pay | Admitting: Diagnostic Neuroimaging

## 2018-01-04 ENCOUNTER — Encounter

## 2018-01-04 ENCOUNTER — Ambulatory Visit (INDEPENDENT_AMBULATORY_CARE_PROVIDER_SITE_OTHER): Payer: 59 | Admitting: Diagnostic Neuroimaging

## 2018-01-04 VITALS — BP 197/87 | HR 69 | Ht 64.0 in | Wt 127.8 lb

## 2018-01-04 DIAGNOSIS — I742 Embolism and thrombosis of arteries of the upper extremities: Secondary | ICD-10-CM

## 2018-01-04 DIAGNOSIS — R0989 Other specified symptoms and signs involving the circulatory and respiratory systems: Secondary | ICD-10-CM

## 2018-01-04 DIAGNOSIS — G40909 Epilepsy, unspecified, not intractable, without status epilepticus: Secondary | ICD-10-CM | POA: Diagnosis not present

## 2018-01-04 NOTE — Progress Notes (Signed)
[facility]  PATIENT: Tracy Peters DOB: Mar 19, 1934  REFERRING CLINICIAN: A Plotnikov HISTORY FROM: patient  REASON FOR VISIT: new consult    HISTORICAL  CHIEF COMPLAINT:  Chief Complaint  Patient presents with  . NP  Plotnikov   Seizures.    No more seizures since 10-20-17  Keppra made to sleepy, changed to depakote 250mg  1/2 tablet po bid.  Rm 7, husband, Marcello Moores.  Bp elevated, has not taken her Bp med today.  She normally runs high 180's she states, Dr. Alain Marion aware.     HISTORY OF PRESENT ILLNESS:  84 year old female here for evaluation of seizures.  History of hypercoagulable state, on chronic warfarin.  10/19/2017 patient was at home and had a seizure.  She had right-sided convulsions, unresponsiveness.  This lasted for 1 minute.  Husband witnessed the seizure and called 911.  Patient started to wake up by the time paramedics arrived.  Patient was taken to the emergency room and had a second seizure in the hospital.  She was treated with IV Keppra initially and this was changed to Depakote due to side effect of sedation.  Seizure work-up was completed and no specific cause was found.  Patient has chronic hyponatremia, chronic insomnia, and had issues with right hip replacement and pain since early 2019.  Since discharge patient has been taking divalproex 250 mg twice a day but this was reduced to 125 mg twice a day due to LFT elevation.  No seizures.   REVIEW OF SYSTEMS: Full 14 system review of systems performed and negative with exception of: easy bruising hearing loss incontinence feeling cold.    ALLERGIES: Allergies  Allergen Reactions  . Bee Venom Anaphylaxis and Other (See Comments)    Yellow jackets, wasps as well  . Ivp Dye [Iodinated Diagnostic Agents] Anaphylaxis  . Shellfish Allergy Anaphylaxis  . Temazepam Other (See Comments)    Severe Hallucinations and Paranoia  . Amlodipine Besylate Swelling  . Aspirin Other (See Comments)   REACTION: unspecified  . Atorvastatin Diarrhea  . Cholestyramine Other (See Comments)    REACTION: mouth irritation  . Codeine Phosphate Nausea And Vomiting  . Demerol [Meperidine] Other (See Comments)    Severe Hallucination!!!!  . Diphenhydramine Hcl Other (See Comments)    hyperactive  . Doxycycline Nausea Only  . Gentamicin Sulfate Other (See Comments)    REACTION: unspecified  . Keppra [Levetiracetam]     Made her too groggy/ sleepy  . Meclizine Hcl Other (See Comments)    REACTION: more dizziness on it  . Meperidine Hcl Other (See Comments)    Unknown  . Metronidazole Other (See Comments)    REACTION: bad taste  . Moxifloxacin Other (See Comments)    REACTION: insomnia Able to use Cipro  . Sulfamethoxazole Other (See Comments)    Kidney problems  . Penicillins Swelling, Rash and Other (See Comments)    Has patient had a PCN reaction causing immediate rash, facial/tongue/throat swelling, SOB or lightheadedness with hypotension: Yes Has patient had a PCN reaction causing severe rash involving mucus membranes or skin necrosis: No Has patient had a PCN reaction that required hospitalization: No Has patient had a PCN reaction occurring within the last 10 years: No If all of the above answers are "NO", then may proceed with Cephalosporin use.     HOME MEDICATIONS: Outpatient Medications Prior to Visit  Medication Sig Dispense Refill  . divalproex (DEPAKOTE) 250 MG DR tablet 0.5 tab bid 180 tablet 3  . docusate  sodium (COLACE) 100 MG capsule Take 1 capsule (100 mg total) by mouth daily as needed for mild constipation.    Marland Kitchen EPINEPHrine 0.3 mg/0.3 mL IJ SOAJ injection As dirrected 1 Device 3  . ferrous sulfate 325 (65 FE) MG tablet Take 1 tablet (325 mg total) by mouth 3 (three) times daily after meals. (Patient taking differently: Take 325 mg by mouth every other day. )  3  . fish oil-omega-3 fatty acids 1000 MG capsule Take 2 g by mouth daily.     . fluticasone (FLONASE) 50  MCG/ACT nasal spray Place 2 sprays into both nostrils daily. (Patient taking differently: Place 2 sprays into both nostrils daily as needed for allergies or rhinitis. ) 48 g 3  . losartan (COZAAR) 100 MG tablet Take 1 tablet (100 mg total) by mouth daily. 90 tablet 3  . mupirocin ointment (BACTROBAN) 2 % Apply topically daily. Cleanse wound to right lower leg with NS.  Apply mupirocin ointment to wound.  Cover with 4x4 gauze and wrap with kerlix/tape.  Change daily. 22 g 3  . valACYclovir (VALTREX) 1000 MG tablet Take 1 tablet (1,000 mg total) by mouth 2 (two) times daily. 180 tablet 3  . warfarin (COUMADIN) 5 MG tablet Take 1 tablet (5 mg total) by mouth daily at 2 PM. HOLD warfarin today (10/22/17), if INR less than 3, start warfarin 5mg  daily on 5/10. 90 tablet 3  . zolpidem (AMBIEN) 10 MG tablet 10 mg at hs prn. OK to repeat 5 mg in 4 hrs prn 135 tablet 1  . HYDROcodone-acetaminophen (NORCO/VICODIN) 5-325 MG tablet as needed.      No facility-administered medications prior to visit.     PAST MEDICAL HISTORY: Past Medical History:  Diagnosis Date  . Adenomatous colon polyp   . Allergic rhinitis    uses FLonase nightly  . Anemia   . Arthritis    joint pain  . Blood transfusion   . Bronchitis    hx of-7-32yrs ago  . Carpal tunnel syndrome    numbness and tingling   . Cataracts, bilateral   . Chronic anticoagulation 03/21/2013  . Congenital absence of one kidney   . Diarrhea    takes Immodium daily as needed or Lomotil   . Diverticulosis   . GERD (gastroesophageal reflux disease)    mild  . Herpes    takes Valtrex daily  . History of colon polyps   . History of DVT (deep vein thrombosis) 1998   right arm  . History of staph infection   . Hyperlipidemia    Medical MD doesn't think its absolutely necessary  . Hypertension    takes Losartan daily  . IBS (irritable bowel syndrome)    takes Electronics engineer daily  . Insomnia    takes Ambien nightly as needed  . LBP (low back pain)   .  Long term (current) use of anticoagulants    Dr. Beryle Beams  . Nocturia   . Osteoporosis   . Raynaud's disease   . 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 Winfield Cunas  . Seizures ([place]) 10/20/2017  . 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 Urinary leakage   . UTI (urinary tract infection)     PAST SURGICAL HISTORY: Past Surgical History:  Procedure Laterality Date  .  kidney disease left     . ABDOMINAL HYSTERECTOMY    . ANTERIOR CERVICAL DECOMP/DISCECTOMY FUSION N/A 01/18/2014  Procedure: ANTERIOR CERVICAL DECOMPRESSION/DISCECTOMY FUSION 3 LEVELS  Cervical  four/five, five/six, six/seven anterior cervical decompression with fusion interbody prosthesis with plating and bonegraft;  Surgeon: Newman Pies, MD;  Location: [place] [facility];  Service: Neurosurgery;  Laterality: N/A;  t  . APPENDECTOMY    . BACK SURGERY     2  . BREAST SURGERY     cystectomy-benign  . CARPAL TUNNEL RELEASE Right 06/12/2014   Procedure: CARPAL TUNNEL RELEASE;  Surgeon: Newman Pies, MD;  Location: [place] [facility];  Service: Neurosurgery;  Laterality: Right;  Right Carpal Tunnel Release  . CATARACT EXTRACTION Bilateral 02/15/2015  . CHOLECYSTECTOMY    . COLONOSCOPY    . [place]  . HIP CLOSED REDUCTION Right 08/23/2017   Procedure: CLOSED MANIPULATION HIP;  Surgeon: Nicholes Stairs, MD;  Location: [facility];  Service: Orthopedics;  Laterality: Right;  . I&D of abdomen  1988   couple of wks after gallbladder removed  . KNEE SURGERY     left x 3  . LUMBAR LAMINECTOMY     X 2  . mortons neuromas removed    . OPEN SURGICAL REPAIR OF GLUTEAL TENDON Right 06/15/2017   Procedure: OPEN SURGICAL REPAIR OF GLUTEALmedius TENDON;  Surgeon: Paralee Cancel, MD;  Location: [facility];  Service: Orthopedics;  Laterality: Right;  . SHOULDER SURGERY     Right  . TOTAL HIP ARTHROPLASTY Right 06/15/2017   Procedure: Right total hip  arthroplasty, bursectomy, repair of gluteal tendon, posterior approach;  Surgeon: Paralee Cancel, MD;  Location: [facility];  Service: Orthopedics;  Laterality: Right;  90 mins for all procedures together    FAMILY HISTORY: Family History  Problem Relation Age of Onset  . Breast cancer Sister   . Kidney disease Sister 57       nephritis  . Parkinsonism Mother   . Heart disease Brother   . Osteoarthritis Other   . Ulcerative colitis Sister   . Kidney disease Sister   . Colon cancer Neg Hx     SOCIAL HISTORY:  Social History   Socioeconomic History  . Marital status: Married    Spouse name: Not on file  . Number of children: 2  . Years of education: Not on file  . Highest education level: Not on file  Occupational History  . Occupation: Retired    Fish farm manager: RETIRED  Social Needs  . Financial resource strain: Not on file  . Food insecurity:    Worry: Not on file    Inability: Not on file  . Transportation needs:    Medical: Not on file    Non-medical: Not on file  Tobacco Use  . Smoking status: Never Smoker  . Smokeless tobacco: Never Used  Substance and Sexual Activity  . Alcohol use: No    Alcohol/week: 0.0 oz  . Drug use: No  . Sexual activity: Never  Lifestyle  . Physical activity:    Days per week: Not on file    Minutes per session: Not on file  . Stress: Not on file  Relationships  . Social connections:    Talks on phone: Not on file    Gets together: Not on file    Attends religious service: Not on file    Active member of club or organization: Not on file    Attends meetings of clubs or organizations: Not on file    Relationship status: Not on file  . Intimate partner violence:    Fear of current  or ex partner: Not on file    Emotionally abused: Not on file    Physically abused: Not on file    Forced sexual activity: Not on file  Other Topics Concern  . Not on file  Social History Narrative   Lives home with husband.  Education- graduate level.   Children 2.       PHYSICAL EXAM  GENERAL EXAM/CONSTITUTIONAL: Vitals:  Vitals:   01/04/18 1132  BP: (!) 197/87  Pulse: 69  Weight: 127 lb 12.8 oz (58 kg)  Height: 5\' 4"  (1.626 m)     Body mass index is 21.94 kg/m.  Visual Acuity Screening   Right eye Left eye Both eyes  Without correction:     With correction: 20/30 20/30   Comments: Has had cataract surgery bilateral with corrective lenses places.     Patient is in no distress; well developed, nourished and groomed; neck is supple  CARDIOVASCULAR:  Examination of carotid arteries is normal; BILATERAL CAROTID BRUITS  Regular rate and rhythm, no murmurs  Examination of peripheral vascular system by observation and palpation is normal  EYES:  Ophthalmoscopic exam of optic discs and posterior segments is normal; no papilledema or hemorrhages  MUSCULOSKELETAL:  Gait, strength, tone, movements noted in Neurologic exam below  NEUROLOGIC: MENTAL STATUS:  No flowsheet data found.  awake, alert, oriented to person, place and time  recent and remote memory intact  normal attention and concentration  language fluent, comprehension intact, naming intact,   fund of knowledge appropriate  CRANIAL NERVE:   2nd - no papilledema on fundoscopic exam  2nd, 3rd, 4th, 6th - pupils equal and reactive to light, visual fields full to confrontation, extraocular muscles intact, no nystagmus  5th - facial sensation symmetric  7th - facial strength symmetric  8th - hearing intact  9th - palate elevates symmetrically, uvula midline  11th - shoulder shrug symmetric  12th - tongue protrusion midline  MOTOR:   normal bulk and tone, full strength in the BUE, BLE  SENSORY:   normal and symmetric to light touch, pinprick, temperature, vibration  COORDINATION:   finger-nose-finger, fine finger movements normal  REFLEXES:   deep tendon reflexes present and symmetric  GAIT/STATION:   narrow based gait; able to  walk on toes, heels and tandem; romberg is negative    DIAGNOSTIC DATA (LABS, IMAGING, TESTING) - I reviewed patient records, labs, notes, testing and imaging myself where available.  Lab Results  Component Value Date   WBC 6.4 12/16/2017   HGB 12.7 12/16/2017   HCT 36.3 12/16/2017   MCV 111.8 Repeated and verified X2. (H) 12/16/2017   PLT 299.0 12/16/2017      Component Value Date/Time   NA 129 (L) 12/16/2017 0927   NA 136 08/18/2016 1148   NA 132 (L) 03/21/2014 1100   K 5.0 12/16/2017 0927   K 4.9 03/21/2014 1100   CL 94 (L) 12/16/2017 0927   CL 97 (L) 03/16/2012 1344   CO2 29 12/16/2017 0927   CO2 26 03/21/2014 1100   GLUCOSE 90 12/16/2017 0927   GLUCOSE 78 03/21/2014 1100   GLUCOSE 84 03/16/2012 1344   BUN 13 12/16/2017 0927   BUN 21 08/18/2016 1148   BUN 16.0 03/21/2014 1100   CREATININE 0.71 12/16/2017 0927   CREATININE 0.9 03/21/2014 1100   CALCIUM 9.6 12/16/2017 0927   CALCIUM 9.9 03/21/2014 1100   PROT 7.1 12/16/2017 0927   PROT 7.3 08/18/2016 1148   PROT 7.3 03/21/2014 1100  ALBUMIN 4.0 12/16/2017 0927   ALBUMIN 4.3 08/18/2016 1148   ALBUMIN 3.5 03/21/2014 1100   AST 42 (H) 12/16/2017 0927   AST 22 03/21/2014 1100   ALT 74 (H) 12/16/2017 0927   ALT 15 03/21/2014 1100   ALKPHOS 48 12/16/2017 0927   ALKPHOS 51 03/21/2014 1100   BILITOT 0.6 12/16/2017 0927   BILITOT 0.5 08/18/2016 1148   BILITOT 0.36 03/21/2014 1100   GFRNONAA >60 10/21/2017 0339   GFRAA >60 10/21/2017 0339   Lab Results  Component Value Date   CHOL 196 01/03/2016   HDL 63.10 01/03/2016   LDLCALC 116 (H) 01/03/2016   LDLDIRECT 129.8 05/31/2012   TRIG 85.0 01/03/2016   CHOLHDL 3 01/03/2016   No results found for: HGBA1C Lab Results  Component Value Date   VITAMINB12 351 10/21/2017   Lab Results  Component Value Date   TSH 2.749 10/20/2017    10/20/17 MRI brain [I reviewed images myself and agree with interpretation. -VRP]  - No acute or reversible finding. Age related  atrophy. Moderate chronic small-vessel ischemic changes of the cerebral hemispheric white matter.  10/20/17 EEG  - This predominantly drowsy and asleep EEG is normal.       ASSESSMENT AND PLAN  84 y.o. year old female here with new onset seizure disorder, idiopathic. Now stable on divalproex.   Dx:  1. Seizure disorder ([place])   2. Bilateral carotid bruits      PLAN:  SEIZURE DISORDER (10/19/17; 10/20/17; in setting of temazepam x 1; chronic pain; chronic hyponatremia; unclear etiology) - continue divalproex 125mg  twice a day  - According to [place] law, you can not drive unless you are seizure / syncope free for at least 6 months and under physician's care.   - Please maintain precautions. Do not participate in activities where a loss of awareness could harm you or someone else. No swimming alone, no tub bathing, no hot tubs, no driving, no operating motorized vehicles (cars, ATVs, motocycles, etc), lawnmowers, power tools or firearms. No standing at heights, such as rooftops, ladders or stairs. Avoid hot objects such as stoves, heaters, open fires. Wear a helmet when riding a bicycle, scooter, skateboard, etc. and avoid areas of traffic. Set your water heater to 120 degrees or less.   CAROTID BRUITS (new problem, workup ordered) - check carotid u/s   Return in about 6 months (around 07/07/2018).  I reviewed images, labs, notes, records myself. I summarized findings and reviewed with patient, for this high risk condition (new onset seizure disorder) requiring high complexity decision making.     Penni Bombard, MD 0/99/8338, 25:05 PM Certified in Neurology, Neurophysiology and Neuroimaging  [facility] [address] [place], [place] [address] [phone]

## 2018-01-06 ENCOUNTER — Ambulatory Visit: Payer: 59 | Admitting: Internal Medicine

## 2018-01-07 ENCOUNTER — Ambulatory Visit (HOSPITAL_COMMUNITY)
Admission: RE | Admit: 2018-01-07 | Discharge: 2018-01-07 | Disposition: A | Discharge status: Home / Self Care | Payer: 59 | Source: Ambulatory Visit | Attending: Cardiology | Admitting: Cardiology

## 2018-01-07 DIAGNOSIS — R0989 Other specified symptoms and signs involving the circulatory and respiratory systems: Secondary | ICD-10-CM | POA: Diagnosis not present

## 2018-01-11 ENCOUNTER — Other Ambulatory Visit (INDEPENDENT_AMBULATORY_CARE_PROVIDER_SITE_OTHER): Payer: 59

## 2018-01-11 ENCOUNTER — Telehealth: Payer: Self-pay | Admitting: *Deleted

## 2018-01-11 ENCOUNTER — Encounter: Payer: Self-pay | Admitting: Internal Medicine

## 2018-01-11 ENCOUNTER — Ambulatory Visit (INDEPENDENT_AMBULATORY_CARE_PROVIDER_SITE_OTHER): Payer: 59 | Admitting: Internal Medicine

## 2018-01-11 DIAGNOSIS — G4700 Insomnia, unspecified: Secondary | ICD-10-CM

## 2018-01-11 DIAGNOSIS — R7989 Other specified abnormal findings of blood chemistry: Secondary | ICD-10-CM

## 2018-01-11 DIAGNOSIS — E871 Hypo-osmolality and hyponatremia: Secondary | ICD-10-CM

## 2018-01-11 DIAGNOSIS — R945 Abnormal results of liver function studies: Secondary | ICD-10-CM | POA: Diagnosis not present

## 2018-01-11 DIAGNOSIS — R569 Unspecified convulsions: Secondary | ICD-10-CM | POA: Diagnosis not present

## 2018-01-11 DIAGNOSIS — I742 Embolism and thrombosis of arteries of the upper extremities: Secondary | ICD-10-CM

## 2018-01-11 DIAGNOSIS — R531 Weakness: Secondary | ICD-10-CM | POA: Diagnosis not present

## 2018-01-11 LAB — HEPATIC FUNCTION PANEL
ALT: 21 U/L (ref 0–35)
AST: 23 U/L (ref 0–37)
Albumin: 4.3 g/dL (ref 3.5–5.2)
Alkaline Phosphatase: 46 U/L (ref 39–117)
BILIRUBIN DIRECT: 0.1 mg/dL (ref 0.0–0.3)
BILIRUBIN TOTAL: 0.6 mg/dL (ref 0.2–1.2)
Total Protein: 7.9 g/dL (ref 6.0–8.3)

## 2018-01-11 LAB — BASIC METABOLIC PANEL
BUN: 17 mg/dL (ref 6–23)
CALCIUM: 10.1 mg/dL (ref 8.4–10.5)
CO2: 25 mEq/L (ref 19–32)
CREATININE: 0.8 mg/dL (ref 0.40–1.20)
Chloride: 91 mEq/L — ABNORMAL LOW (ref 96–112)
GFR: 72.92 mL/min (ref >60.00)
GLUCOSE: 95 mg/dL (ref 70–99)
POTASSIUM: 5.3 meq/L — AB (ref 3.5–5.1)
Sodium: 127 mEq/L — ABNORMAL LOW (ref 135–145)

## 2018-01-11 LAB — AMMONIA: Ammonia: 31 umol/L (ref 11–35)

## 2018-01-11 MED ORDER — ZOLPIDEM TARTRATE 10 MG PO TABS: ORAL_TABLET | ORAL | 1 refills | Status: DC

## 2018-01-11 NOTE — Assessment & Plan Note (Signed)
LFTs 

## 2018-01-11 NOTE — Assessment & Plan Note (Signed)
Chronic  Zolpidem 15 mg- pt has to use higher #  x years  Potential benefits of a long term benzodiazepines  use as well as potential risks  and complications were explained to the patient and were aknowledged.

## 2018-01-11 NOTE — Patient Instructions (Signed)
Do not drive 

## 2018-01-11 NOTE — Telephone Encounter (Signed)
-----   Message from Penni Bombard, MD sent at 01/11/2018  1:33 PM EDT ----- Unremarkable study. No major findings. Please call patient. Continue current plan. -VRP
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
## 2018-01-11 NOTE — Assessment & Plan Note (Addendum)
No relapse Low dose Depakote BMET No driving x6 mo total discussed

## 2018-01-11 NOTE — Assessment & Plan Note (Signed)
Fluid restriction 

## 2018-01-12 ENCOUNTER — Other Ambulatory Visit: Payer: Self-pay | Admitting: Internal Medicine

## 2018-01-12 MED ORDER — LOSARTAN POTASSIUM 100 MG PO TABS: 50.0000 mg | ORAL_TABLET | Freq: Every day | ORAL | 3 refills | Status: DC

## 2018-01-12 NOTE — Telephone Encounter (Signed)
Spoke to pt and relayed that her carotid doppler US study results were unremarkable, no major findings.  She verbalized understanding.

## 2018-01-15 DIAGNOSIS — M25551 Pain in right hip: Secondary | ICD-10-CM | POA: Diagnosis not present

## 2018-01-15 DIAGNOSIS — M7061 Trochanteric bursitis, right hip: Secondary | ICD-10-CM | POA: Diagnosis not present

## 2018-01-15 DIAGNOSIS — Z96641 Presence of right artificial hip joint: Secondary | ICD-10-CM | POA: Diagnosis not present

## 2018-01-16 ENCOUNTER — Other Ambulatory Visit: Payer: Self-pay | Admitting: Internal Medicine

## 2018-01-20 DIAGNOSIS — M25551 Pain in right hip: Secondary | ICD-10-CM | POA: Diagnosis not present

## 2018-01-25 ENCOUNTER — Ambulatory Visit (INDEPENDENT_AMBULATORY_CARE_PROVIDER_SITE_OTHER): Payer: 59 | Admitting: Pharmacist

## 2018-01-25 DIAGNOSIS — Z7901 Long term (current) use of anticoagulants: Secondary | ICD-10-CM | POA: Diagnosis not present

## 2018-01-25 DIAGNOSIS — I742 Embolism and thrombosis of arteries of the upper extremities: Secondary | ICD-10-CM | POA: Diagnosis not present

## 2018-01-25 DIAGNOSIS — M25551 Pain in right hip: Secondary | ICD-10-CM | POA: Insufficient documentation

## 2018-01-25 DIAGNOSIS — Z86718 Personal history of other venous thrombosis and embolism: Secondary | ICD-10-CM | POA: Diagnosis not present

## 2018-01-25 DIAGNOSIS — Z5181 Encounter for therapeutic drug level monitoring: Secondary | ICD-10-CM

## 2018-01-25 DIAGNOSIS — D6859 Other primary thrombophilia: Secondary | ICD-10-CM

## 2018-01-25 LAB — POCT INR: INR: 2.1 (ref 2.0–3.0)

## 2018-01-25 MED ORDER — WARFARIN SODIUM 5 MG PO TABS: 7.5000 mg | ORAL_TABLET | Freq: Every day | ORAL | 0 refills | Status: DC

## 2018-01-25 NOTE — Progress Notes (Signed)
Anticoagulation Management Tracy Peters is a 84 y.o. female who reports to the clinic for monitoring of warfarin treatment.    Indication: Chronic anticoagulation; DVT, HX of; Thrombosis of right radial artery; Primary hypercoagulable State; Long term current use of anticoagulant.    Duration: indefinite Supervising physician: Murriel Hopper  Anticoagulation Clinic Visit History: Patient does not report signs/symptoms of bleeding or thromboembolism  Other recent changes: No diet, medications, lifestyle changes endorsed at this visit.  Anticoagulation Episode Summary    Current INR goal:   2.0-3.0  TTR:   70.3 % (6.5 y)  Next INR check:   02/22/2018  INR from last check:   2.1 (01/25/2018)  Weekly max warfarin dose:     Target end date:   Indefinite  INR check location:   Anticoagulation Clinic  Preferred lab:     Send INR reminders to:   ANTICOAG IMP   Indications   Chronic anticoagulation [Z79.01] DVT HX OF [Z86.718] 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HCC) [I74.2] Primary hypercoagulable state ([place]) [D68.59] [D68.59] Long term (current) use of anticoagulants [Z79.01] [Z79.01]       Comments:         Anticoagulation Care Providers    Provider Role Specialty Phone number   Annia Belt, MD Referring Oncology [phone]      Allergies  Allergen Reactions  . Bee Venom Anaphylaxis and Other (See Comments)    Yellow jackets, wasps as well  . Ivp Dye [Iodinated Diagnostic Agents] Anaphylaxis  . Shellfish Allergy Anaphylaxis  . Temazepam Other (See Comments)    Severe Hallucinations and Paranoia  . Amlodipine Besylate Swelling  . Aspirin Other (See Comments)    REACTION: unspecified  . Atorvastatin Diarrhea  . Cholestyramine Other (See Comments)    REACTION: mouth irritation  . Codeine Phosphate Nausea And Vomiting  . Demerol [Meperidine] Other (See Comments)    Severe Hallucination!!!!  . Diphenhydramine Hcl Other (See Comments)    hyperactive  .  Doxycycline Nausea Only  . Gentamicin Sulfate Other (See Comments)    REACTION: unspecified  . Keppra [Levetiracetam]     Made her too groggy/ sleepy  . Meclizine Hcl Other (See Comments)    REACTION: more dizziness on it  . Meperidine Hcl Other (See Comments)    Unknown  . Metronidazole Other (See Comments)    REACTION: bad taste  . Moxifloxacin Other (See Comments)    REACTION: insomnia Able to use Cipro  . Sulfamethoxazole Other (See Comments)    Kidney problems  . Penicillins Swelling, Rash and Other (See Comments)    Has patient had a PCN reaction causing immediate rash, facial/tongue/throat swelling, SOB or lightheadedness with hypotension: Yes Has patient had a PCN reaction causing severe rash involving mucus membranes or skin necrosis: No Has patient had a PCN reaction that required hospitalization: No Has patient had a PCN reaction occurring within the last 10 years: No If all of the above answers are "NO", then may proceed with Cephalosporin use.    Prior to Admission medications   Medication Sig Start Date End Date Taking? Authorizing Provider  divalproex (DEPAKOTE) 250 MG DR tablet 0.5 tab bid 12/17/17  Yes Plotnikov, Evie Lacks, MD  docusate sodium (COLACE) 100 MG capsule Take 1 capsule (100 mg total) by mouth daily as needed for mild constipation. 10/22/17  Yes Rai, Ripudeep K, MD  EPINEPHrine 0.3 mg/0.3 mL IJ SOAJ injection As dirrected 01/27/17  Yes Plotnikov, Evie Lacks, MD  ferrous sulfate 325 (65 FE) MG  tablet Take 1 tablet (325 mg total) by mouth 3 (three) times daily after meals. Patient taking differently: Take 325 mg by mouth every other day.  06/18/17  Yes Babish, Rodman Key, PA-C  fish oil-omega-3 fatty acids 1000 MG capsule Take 2 g by mouth daily.    Yes [provider]  fluticasone (FLONASE) 50 MCG/ACT nasal spray Place 2 sprays into both nostrils daily. Patient taking differently: Place 2 sprays into both nostrils daily as needed for allergies or rhinitis.   07/29/17  Yes Plotnikov, Evie Lacks, MD  losartan (COZAAR) 100 MG tablet Take 0.5 tablets (50 mg total) by mouth daily. 01/12/18  Yes Plotnikov, Evie Lacks, MD  mupirocin ointment (BACTROBAN) 2 % Apply topically daily. Cleanse wound to right lower leg with NS.  Apply mupirocin ointment to wound.  Cover with 4x4 gauze and wrap with kerlix/tape.  Change daily. 10/23/17  Yes Rai, Ripudeep K, MD  valACYclovir (VALTREX) 1000 MG tablet Take 1 tablet (1,000 mg total) by mouth 2 (two) times daily. 01/27/17  Yes Plotnikov, Evie Lacks, MD  warfarin (COUMADIN) 5 MG tablet Take 1.5 tablets (7.5 mg total) by mouth daily at 6 PM. 01/25/18 04/25/18 Yes Pennie Banter, RPH-CPP  zolpidem (AMBIEN) 10 MG tablet 1 & 1/2 TABLETS (15MG ) BY MOUTH AT BEDTIME AS NEEDED FOR SLEEP, OK TOREPEAT IN 4HRS 01/18/18  Yes Plotnikov, Evie Lacks, MD   Past Medical History:  Diagnosis Date  . Adenomatous colon polyp   . Allergic rhinitis    uses FLonase nightly  . Anemia   . Arthritis    joint pain  . Blood transfusion   . Bronchitis    hx of-7-27yrs ago  . Carpal tunnel syndrome    numbness and tingling   . Cataracts, bilateral   . Chronic anticoagulation 03/21/2013  . Congenital absence of one kidney   . Diarrhea    takes Immodium daily as needed or Lomotil   . Diverticulosis   . GERD (gastroesophageal reflux disease)    mild  . Herpes    takes Valtrex daily  . History of colon polyps   . History of DVT (deep vein thrombosis) 1998   right arm  . History of staph infection   . Hyperlipidemia    Medical MD doesn't think its absolutely necessary  . Hypertension    takes Losartan daily  . IBS (irritable bowel syndrome)    takes Electronics engineer daily  . Insomnia    takes Ambien nightly as needed  . LBP (low back pain)   . Long term (current) use of anticoagulants    Dr. Beryle Beams  . Nocturia   . Osteoporosis   . Raynaud's disease   . 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 Winfield Cunas  . Seizures ([place]) 10/20/2017  .  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 Urinary leakage   . UTI (urinary tract infection)    Social History   Socioeconomic History  . Marital status: Married    Spouse name: Not on file  . Number of children: 2  . Years of education: Not on file  . Highest education level: Not on file  Occupational History  . Occupation: Retired    Fish farm manager: RETIRED  Social Needs  . Financial resource strain: Not on file  . Food insecurity:    Worry: Not on file    Inability: Not on file  . Transportation needs:    Medical: Not  on file    Non-medical: Not on file  Tobacco Use  . Smoking status: Never Smoker  . Smokeless tobacco: Never Used  Substance and Sexual Activity  . Alcohol use: No    Alcohol/week: 0.0 standard drinks  . Drug use: No  . Sexual activity: Never  Lifestyle  . Physical activity:    Days per week: Not on file    Minutes per session: Not on file  . Stress: Not on file  Relationships  . Social connections:    Talks on phone: Not on file    Gets together: Not on file    Attends religious service: Not on file    Active member of club or organization: Not on file    Attends meetings of clubs or organizations: Not on file    Relationship status: Not on file  Other Topics Concern  . Not on file  Social History Narrative   Lives home with husband.  Education- graduate level.  Children 2.     Family History  Problem Relation Age of Onset  . Breast cancer Sister   . Kidney disease Sister 7       nephritis  . Parkinsonism Mother   . Heart disease Brother   . Osteoarthritis Other   . Ulcerative colitis Sister   . Kidney disease Sister   . Colon cancer Neg Hx     ASSESSMENT Recent Results: The most recent result is correlated with 47.5 mg per week: Lab Results  Component Value Date   INR 2.1 01/25/2018   INR 2.3 12/28/2017   INR 3.1 (A) 12/14/2017   PROTIME 39.6 (H) 04/03/2015    Anticoagulation  Dosing: Description   Take 1 & 1/2 tablets of your 5mg  peach-colored warfarin tablets by mouth, once-daily at 6PM.    DR Azucena Freed PATIENT (route warfarin notes and list as authorizing provider for LOS & Follow-up, lab orders and warfarin prescriptions), do not list attending physician      INR today: Therapeutic  PLAN Weekly dose was increased by 10% to 52.5 mg per week  Patient Instructions  Patient instructed to take medications as defined in the Anti-coagulation Track section of this encounter.  Patient instructed to take today's dose.  Patient instructed to take 1 & 1/2 tablets of your 5mg  peach-colored warfarin tablets by mouth, once-daily at [facility].   Patient verbalized understanding of these instructions.     Patient advised to contact clinic or seek medical attention if signs/symptoms of bleeding or thromboembolism occur.  Patient verbalized understanding by repeating back information and was advised to contact me if further medication-related questions arise. Patient was also provided an information handout.  Follow-up Return in 4 weeks (on 02/22/2018) for Follow up INR at 1130h.  Pennie Banter, PharmD, CPP  15 minutes spent face-to-face with the patient during the encounter. 50% of time spent on education. 50% of time was spent on fingerstick point of care INR sample collection, processing, results determination, dose adjustment and documentation in [email or web address].

## 2018-01-25 NOTE — Patient Instructions (Signed)
Patient instructed to take medications as defined in the Anti-coagulation Track section of this encounter.  Patient instructed to take today's dose.  Patient instructed to take 1 & 1/2 tablets of your 5mg  peach-colored warfarin tablets by mouth, once-daily at [facility].   Patient verbalized understanding of these instructions.

## 2018-01-25 NOTE — Progress Notes (Signed)
Reviewed thx DrG 

## 2018-01-27 DIAGNOSIS — M25551 Pain in right hip: Secondary | ICD-10-CM | POA: Diagnosis not present

## 2018-02-03 DIAGNOSIS — M25551 Pain in right hip: Secondary | ICD-10-CM | POA: Diagnosis not present

## 2018-02-07 ENCOUNTER — Other Ambulatory Visit: Payer: Self-pay | Admitting: Internal Medicine

## 2018-02-08 DIAGNOSIS — M25551 Pain in right hip: Secondary | ICD-10-CM | POA: Diagnosis not present

## 2018-02-17 ENCOUNTER — Ambulatory Visit: Payer: 59 | Admitting: Internal Medicine

## 2018-02-22 ENCOUNTER — Ambulatory Visit: Payer: 59

## 2018-02-22 DIAGNOSIS — M25551 Pain in right hip: Secondary | ICD-10-CM | POA: Diagnosis not present

## 2018-02-22 DIAGNOSIS — M25561 Pain in right knee: Secondary | ICD-10-CM | POA: Diagnosis not present

## 2018-02-22 DIAGNOSIS — Z96641 Presence of right artificial hip joint: Secondary | ICD-10-CM | POA: Diagnosis not present

## 2018-02-22 DIAGNOSIS — M1711 Unilateral primary osteoarthritis, right knee: Secondary | ICD-10-CM | POA: Diagnosis not present

## 2018-03-01 ENCOUNTER — Ambulatory Visit (INDEPENDENT_AMBULATORY_CARE_PROVIDER_SITE_OTHER): Payer: 59 | Admitting: Pharmacist

## 2018-03-01 DIAGNOSIS — D6859 Other primary thrombophilia: Secondary | ICD-10-CM

## 2018-03-01 DIAGNOSIS — Z86718 Personal history of other venous thrombosis and embolism: Secondary | ICD-10-CM

## 2018-03-01 DIAGNOSIS — Z7901 Long term (current) use of anticoagulants: Secondary | ICD-10-CM

## 2018-03-01 DIAGNOSIS — I742 Embolism and thrombosis of arteries of the upper extremities: Secondary | ICD-10-CM | POA: Diagnosis not present

## 2018-03-01 DIAGNOSIS — Z5181 Encounter for therapeutic drug level monitoring: Secondary | ICD-10-CM

## 2018-03-01 LAB — POCT INR: INR: 2.4 (ref 2.0–3.0)

## 2018-03-01 NOTE — Progress Notes (Signed)
Anticoagulation Management Tracy Peters is a 84 y.o. female who reports to the clinic for monitoring of warfarin treatment.    Indication:  Chronic anticoagulation, History of DVT, History of thrombosis of right radial artery, Primary hypercoagulable state.  Duration: indefinite Supervising physician: Murriel Hopper  Anticoagulation Clinic Visit History: Patient does not report signs/symptoms of bleeding or thromboembolism  Other recent changes: No diet, medications, lifestyle changes endorsed.  Anticoagulation Episode Summary    Current INR goal:   2.0-3.0  TTR:   70.7 % (6.6 y)  Next INR check:   03/29/2018  INR from last check:   2.4 (03/01/2018)  Weekly max warfarin dose:     Target end date:   Indefinite  INR check location:   Anticoagulation Clinic  Preferred lab:     Send INR reminders to:   ANTICOAG IMP   Indications   Chronic anticoagulation [Z79.01] DVT HX OF [Z86.718] 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HCC) [I74.2] Primary hypercoagulable state ([place]) [D68.59] [D68.59] Long term (current) use of anticoagulants [Z79.01] [Z79.01]       Comments:         Anticoagulation Care Providers    Provider Role Specialty Phone number   Annia Belt, MD Referring Oncology [phone]      Allergies  Allergen Reactions  . Bee Venom Anaphylaxis and Other (See Comments)    Yellow jackets, wasps as well  . Ivp Dye [Iodinated Diagnostic Agents] Anaphylaxis  . Shellfish Allergy Anaphylaxis  . Temazepam Other (See Comments)    Severe Hallucinations and Paranoia  . Amlodipine Besylate Swelling  . Aspirin Other (See Comments)    REACTION: unspecified  . Atorvastatin Diarrhea  . Cholestyramine Other (See Comments)    REACTION: mouth irritation  . Codeine Phosphate Nausea And Vomiting  . Demerol [Meperidine] Other (See Comments)    Severe Hallucination!!!!  . Diphenhydramine Hcl Other (See Comments)    hyperactive  . Doxycycline Nausea Only  . Gentamicin  Sulfate Other (See Comments)    REACTION: unspecified  . Keppra [Levetiracetam]     Made her too groggy/ sleepy  . Meclizine Hcl Other (See Comments)    REACTION: more dizziness on it  . Meperidine Hcl Other (See Comments)    Unknown  . Metronidazole Other (See Comments)    REACTION: bad taste  . Moxifloxacin Other (See Comments)    REACTION: insomnia Able to use Cipro  . Sulfamethoxazole Other (See Comments)    Kidney problems  . Penicillins Swelling, Rash and Other (See Comments)    Has patient had a PCN reaction causing immediate rash, facial/tongue/throat swelling, SOB or lightheadedness with hypotension: Yes Has patient had a PCN reaction causing severe rash involving mucus membranes or skin necrosis: No Has patient had a PCN reaction that required hospitalization: No Has patient had a PCN reaction occurring within the last 10 years: No If all of the above answers are "NO", then may proceed with Cephalosporin use.    Prior to Admission medications   Medication Sig Start Date End Date Taking? Authorizing Provider  divalproex (DEPAKOTE) 250 MG DR tablet 0.5 tab bid 12/17/17  Yes Plotnikov, Evie Lacks, MD  docusate sodium (COLACE) 100 MG capsule Take 1 capsule (100 mg total) by mouth daily as needed for mild constipation. 10/22/17  Yes Rai, Ripudeep K, MD  EPINEPHrine 0.3 mg/0.3 mL IJ SOAJ injection As dirrected 01/27/17  Yes Plotnikov, Evie Lacks, MD  ferrous sulfate 325 (65 FE) MG tablet Take 1 tablet (325 mg total) by  mouth 3 (three) times daily after meals. Patient taking differently: Take 325 mg by mouth every other day.  06/18/17  Yes Babish, Rodman Key, PA-C  fish oil-omega-3 fatty acids 1000 MG capsule Take 2 g by mouth daily.    Yes [provider]  fluticasone (FLONASE) 50 MCG/ACT nasal spray Place 2 sprays into both nostrils daily. Patient taking differently: Place 2 sprays into both nostrils daily as needed for allergies or rhinitis.  07/29/17  Yes Plotnikov, Evie Lacks, MD   losartan (COZAAR) 100 MG tablet Take 0.5 tablets (50 mg total) by mouth daily. 01/12/18  Yes Plotnikov, Evie Lacks, MD  mupirocin ointment (BACTROBAN) 2 % Apply topically daily. Cleanse wound to right lower leg with NS.  Apply mupirocin ointment to wound.  Cover with 4x4 gauze and wrap with kerlix/tape.  Change daily. 10/23/17  Yes Rai, Ripudeep K, MD  valACYclovir (VALTREX) 1000 MG tablet TAKE 1 TABLET BY MOUTH TWICE DAILY. 02/07/18  Yes Plotnikov, Evie Lacks, MD  warfarin (COUMADIN) 5 MG tablet Take 1.5 tablets (7.5 mg total) by mouth daily at 6 PM. 01/25/18 04/25/18 Yes Pennie Banter, RPH-CPP  zolpidem (AMBIEN) 10 MG tablet 1 & 1/2 TABLETS (15MG ) BY MOUTH AT BEDTIME AS NEEDED FOR SLEEP, OK TOREPEAT IN 4HRS 01/18/18  Yes Plotnikov, Evie Lacks, MD   Past Medical History:  Diagnosis Date  . Adenomatous colon polyp   . Allergic rhinitis    uses FLonase nightly  . Anemia   . Arthritis    joint pain  . Blood transfusion   . Bronchitis    hx of-7-29yrs ago  . Carpal tunnel syndrome    numbness and tingling   . Cataracts, bilateral   . Chronic anticoagulation 03/21/2013  . Congenital absence of one kidney   . Diarrhea    takes Immodium daily as needed or Lomotil   . Diverticulosis   . GERD (gastroesophageal reflux disease)    mild  . Herpes    takes Valtrex daily  . History of colon polyps   . History of DVT (deep vein thrombosis) 1998   right arm  . History of staph infection   . Hyperlipidemia    Medical MD doesn't think its absolutely necessary  . Hypertension    takes Losartan daily  . IBS (irritable bowel syndrome)    takes Electronics engineer daily  . Insomnia    takes Ambien nightly as needed  . LBP (low back pain)   . Long term (current) use of anticoagulants    Dr. Beryle Beams  . Nocturia   . Osteoporosis   . Raynaud's disease   . 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 Winfield Cunas  . Seizures ([place]) 10/20/2017  . 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 Urinary leakage   . UTI (urinary tract infection)    Social History   Socioeconomic History  . Marital status: Married    Spouse name: Not on file  . Number of children: 2  . Years of education: Not on file  . Highest education level: Not on file  Occupational History  . Occupation: Retired    Fish farm manager: RETIRED  Social Needs  . Financial resource strain: Not on file  . Food insecurity:    Worry: Not on file    Inability: Not on file  . Transportation needs:    Medical: Not on file    Non-medical: Not on file  Tobacco Use  .  Smoking status: Never Smoker  . Smokeless tobacco: Never Used  Substance and Sexual Activity  . Alcohol use: No    Alcohol/week: 0.0 standard drinks  . Drug use: No  . Sexual activity: Never  Lifestyle  . Physical activity:    Days per week: Not on file    Minutes per session: Not on file  . Stress: Not on file  Relationships  . Social connections:    Talks on phone: Not on file    Gets together: Not on file    Attends religious service: Not on file    Active member of club or organization: Not on file    Attends meetings of clubs or organizations: Not on file    Relationship status: Not on file  Other Topics Concern  . Not on file  Social History Narrative   Lives home with husband.  Education- graduate level.  Children 2.     Family History  Problem Relation Age of Onset  . Breast cancer Sister   . Kidney disease Sister 33       nephritis  . Parkinsonism Mother   . Heart disease Brother   . Osteoarthritis Other   . Ulcerative colitis Sister   . Kidney disease Sister   . Colon cancer Neg Hx     ASSESSMENT Recent Results: The most recent result is correlated with 52.5 mg per week: Lab Results  Component Value Date   INR 2.4 03/01/2018   INR 2.1 01/25/2018   INR 2.3 12/28/2017   PROTIME 39.6 (H) 04/03/2015    Anticoagulation Dosing: Description   Take 1 & 1/2 tablets of your 5mg  peach-colored  warfarin tablets by mouth, once-daily at 6PM.    DR Azucena Freed PATIENT (route warfarin notes and list as authorizing provider for LOS & Follow-up, lab orders and warfarin prescriptions), do not list attending physician      INR today: Therapeutic  PLAN Weekly dose was unchanged.   Patient Instructions  Patient instructed to take medications as defined in the Anti-coagulation Track section of this encounter.  Patient instructed to take today's dose.  Patient instructed to take  1 & 1/2 tablets of your 5mg  peach-colored warfarin tablets by mouth, once-daily at [facility]. Patient verbalized understanding of these instructions.     Patient advised to contact clinic or seek medical attention if signs/symptoms of bleeding or thromboembolism occur.  Patient verbalized understanding by repeating back information and was advised to contact me if further medication-related questions arise. Patient was also provided an information handout.  Follow-up Return in 4 weeks (on 03/29/2018) for Follow up INR at 1130h.  Pennie Banter, PharmD, CACP, CPP  15 minutes spent face-to-face with the patient during the encounter. 50% of time spent on education. 50% of time was spent on fingerstick point of care INR sample collection, processing, results determination, and documentation in [email or web address]

## 2018-03-01 NOTE — Patient Instructions (Signed)
Patient instructed to take medications as defined in the Anti-coagulation Track section of this encounter.  Patient instructed to take today's dose.  Patient instructed to take  1 & 1/2 tablets of your 5mg  peach-colored warfarin tablets by mouth, once-daily at [facility]. Patient verbalized understanding of these instructions.

## 2018-03-01 NOTE — Progress Notes (Signed)
Reviewed thx DrG 

## 2018-03-05 ENCOUNTER — Encounter: Payer: Self-pay | Admitting: Internal Medicine

## 2018-03-05 ENCOUNTER — Ambulatory Visit (INDEPENDENT_AMBULATORY_CARE_PROVIDER_SITE_OTHER): Payer: 59 | Admitting: Internal Medicine

## 2018-03-05 VITALS — BP 142/88 | HR 74 | Temp 98.0°F | Ht 64.0 in | Wt 128.0 lb

## 2018-03-05 DIAGNOSIS — G8929 Other chronic pain: Secondary | ICD-10-CM

## 2018-03-05 DIAGNOSIS — D500 Iron deficiency anemia secondary to blood loss (chronic): Secondary | ICD-10-CM | POA: Diagnosis not present

## 2018-03-05 DIAGNOSIS — I10 Essential (primary) hypertension: Secondary | ICD-10-CM | POA: Diagnosis not present

## 2018-03-05 DIAGNOSIS — M5440 Lumbago with sciatica, unspecified side: Secondary | ICD-10-CM

## 2018-03-05 DIAGNOSIS — Z23 Encounter for immunization: Secondary | ICD-10-CM | POA: Diagnosis not present

## 2018-03-05 DIAGNOSIS — R569 Unspecified convulsions: Secondary | ICD-10-CM | POA: Diagnosis not present

## 2018-03-05 DIAGNOSIS — I742 Embolism and thrombosis of arteries of the upper extremities: Secondary | ICD-10-CM

## 2018-03-05 MED ORDER — ZOLPIDEM TARTRATE 10 MG PO TABS: ORAL_TABLET | ORAL | 1 refills | Status: DC

## 2018-03-05 MED ORDER — ZOSTER VAC RECOMB ADJUVANTED 50 MCG/0.5ML IM SUSR: 0.5000 mL | Freq: Once | INTRAMUSCULAR | 1 refills | Status: AC

## 2018-03-05 MED ORDER — DIVALPROEX SODIUM 250 MG PO DR TAB: DELAYED_RELEASE_TABLET | ORAL | 3 refills | Status: DC

## 2018-03-05 NOTE — Assessment & Plan Note (Signed)
Monitoring CBC 

## 2018-03-05 NOTE — Patient Instructions (Signed)
Rice sock heating pad 

## 2018-03-05 NOTE — Progress Notes (Signed)
Subjective:  Patient ID: Tracy Peters, female    DOB: December 23, 1933  Age: 84 y.o. MRN: [id number]  CC: No chief complaint on file.   HPI Tracy Peters presents for seizures, insomnia, anticoagulation f/u  Outpatient Medications Prior to Visit  Medication Sig Dispense Refill  . divalproex (DEPAKOTE) 250 MG DR tablet 0.5 tab bid 180 tablet 3  . docusate sodium (COLACE) 100 MG capsule Take 1 capsule (100 mg total) by mouth daily as needed for mild constipation.    Marland Kitchen EPINEPHrine 0.3 mg/0.3 mL IJ SOAJ injection As dirrected 1 Device 3  . ferrous sulfate 325 (65 FE) MG tablet Take 1 tablet (325 mg total) by mouth 3 (three) times daily after meals. (Patient taking differently: Take 325 mg by mouth every other day. )  3  . fish oil-omega-3 fatty acids 1000 MG capsule Take 2 g by mouth daily.     . fluticasone (FLONASE) 50 MCG/ACT nasal spray Place 2 sprays into both nostrils daily. (Patient taking differently: Place 2 sprays into both nostrils daily as needed for allergies or rhinitis. ) 48 g 3  . losartan (COZAAR) 100 MG tablet Take 0.5 tablets (50 mg total) by mouth daily. 90 tablet 3  . mupirocin ointment (BACTROBAN) 2 % Apply topically daily. Cleanse wound to right lower leg with NS.  Apply mupirocin ointment to wound.  Cover with 4x4 gauze and wrap with kerlix/tape.  Change daily. 22 g 3  . valACYclovir (VALTREX) 1000 MG tablet TAKE 1 TABLET BY MOUTH TWICE DAILY. 180 tablet 1  . warfarin (COUMADIN) 5 MG tablet Take 1.5 tablets (7.5 mg total) by mouth daily at 6 PM. 135 tablet 0  . zolpidem (AMBIEN) 10 MG tablet 1 & 1/2 TABLETS (15MG ) BY MOUTH AT BEDTIME AS NEEDED FOR SLEEP, OK TOREPEAT IN 4HRS 135 tablet 1   No facility-administered medications prior to visit.     ROS: Review of Systems  Constitutional: Negative for activity change, appetite change, chills, fatigue and unexpected weight change.  HENT: Negative for congestion, mouth sores and sinus pressure.   Eyes: Negative for visual  disturbance.  Respiratory: Negative for cough and chest tightness.   Gastrointestinal: Negative for abdominal pain and nausea.  Genitourinary: Negative for difficulty urinating, frequency and vaginal pain.  Musculoskeletal: Positive for arthralgias. Negative for back pain and gait problem.  Skin: Negative for pallor and rash.  Neurological: Negative for dizziness, tremors, weakness, numbness and headaches.  Psychiatric/Behavioral: Positive for sleep disturbance. Negative for confusion. The patient is nervous/anxious.     Objective:  BP (!) 142/88 (BP Location: Left Arm, Patient Position: Sitting, Cuff Size: Normal)   Pulse 74   Temp 98 F (36.7 C) (Oral)   Ht 5\' 4"  (1.626 m)   Wt 128 lb (58.1 kg)   SpO2 99%   BMI 21.97 kg/m   BP Readings from Last 3 Encounters:  03/05/18 (!) 142/88  01/11/18 (!) 172/94  01/04/18 (!) 197/87    Wt Readings from Last 3 Encounters:  03/05/18 128 lb (58.1 kg)  01/11/18 127 lb (57.6 kg)  01/04/18 127 lb 12.8 oz (58 kg)    Physical Exam  Constitutional: She appears well-developed. No distress.  HENT:  Head: Normocephalic.  Right Ear: External ear normal.  Left Ear: External ear normal.  Nose: Nose normal.  Mouth/Throat: Oropharynx is clear and moist.  Eyes: Pupils are equal, round, and reactive to light. Conjunctivae are normal. Right eye exhibits no discharge. Left eye exhibits no discharge.  Neck: Normal range of motion. Neck supple. No JVD present. No tracheal deviation present. No thyromegaly present.  Cardiovascular: Normal rate, regular rhythm and normal heart sounds.  Pulmonary/Chest: No stridor. No respiratory distress. She has no wheezes.  Abdominal: Soft. Bowel sounds are normal. She exhibits no distension and no mass. There is no tenderness. There is no rebound and no guarding.  Musculoskeletal: She exhibits no edema or tenderness.  Lymphadenopathy:    She has no cervical adenopathy.  Neurological: She displays normal reflexes. No  cranial nerve deficit. She exhibits normal muscle tone. Coordination normal.  Skin: No rash noted. No erythema.  Psychiatric: She has a normal mood and affect. Her behavior is normal. Judgment and thought content normal.  neck tender w/ROM looks well A little ataxic - cane A/o/c  Lab Results  Component Value Date   WBC 6.4 12/16/2017   HGB 12.7 12/16/2017   HCT 36.3 12/16/2017   PLT 299.0 12/16/2017   GLUCOSE 95 01/11/2018   CHOL 196 01/03/2016   TRIG 85.0 01/03/2016   HDL 63.10 01/03/2016   LDLDIRECT 129.8 05/31/2012   LDLCALC 116 (H) 01/03/2016   ALT 21 01/11/2018   AST 23 01/11/2018   NA 127 (L) 01/11/2018   K 5.3 (H) 01/11/2018   CL 91 (L) 01/11/2018   CREATININE 0.80 01/11/2018   BUN 17 01/11/2018   CO2 25 01/11/2018   TSH 2.749 10/20/2017   INR 2.4 03/01/2018    No results found.  Assessment & Plan:   There are no diagnoses linked to this encounter.   No orders of the defined types were placed in this encounter.    Follow-up: No follow-ups on file.  [name], MD

## 2018-03-05 NOTE — Assessment & Plan Note (Signed)
Tramadol prn ° Potential benefits of a long term opioids use as well as potential risks (i.e. addiction risk, apnea etc) and complications (i.e. Somnolence, constipation and others) were explained to the patient and were aknowledged. ° ° °

## 2018-03-05 NOTE — Assessment & Plan Note (Signed)
On Depakote 1/2 tab bid

## 2018-03-05 NOTE — Addendum Note (Signed)
Addended byKarren Cobble on: 03/05/2018 02:04 PM   Modules accepted: Orders

## 2018-03-05 NOTE — Assessment & Plan Note (Signed)
Losartan 

## 2018-03-12 ENCOUNTER — Other Ambulatory Visit (INDEPENDENT_AMBULATORY_CARE_PROVIDER_SITE_OTHER): Payer: 59

## 2018-03-12 DIAGNOSIS — R569 Unspecified convulsions: Secondary | ICD-10-CM

## 2018-03-12 DIAGNOSIS — I10 Essential (primary) hypertension: Secondary | ICD-10-CM | POA: Diagnosis not present

## 2018-03-12 LAB — BASIC METABOLIC PANEL
BUN: 20 mg/dL (ref 6–23)
CHLORIDE: 93 meq/L — AB (ref 96–112)
CO2: 29 meq/L (ref 19–32)
Calcium: 9.4 mg/dL (ref 8.4–10.5)
Creatinine, Ser: 0.86 mg/dL (ref 0.40–1.20)
GFR: 67.05 mL/min (ref >60.00)
GLUCOSE: 72 mg/dL (ref 70–99)
Potassium: 4.3 mEq/L (ref 3.5–5.1)
Sodium: 129 mEq/L — ABNORMAL LOW (ref 135–145)

## 2018-03-29 ENCOUNTER — Telehealth: Payer: Self-pay | Admitting: Pharmacist

## 2018-03-29 ENCOUNTER — Other Ambulatory Visit (INDEPENDENT_AMBULATORY_CARE_PROVIDER_SITE_OTHER): Payer: 59

## 2018-03-29 ENCOUNTER — Ambulatory Visit (INDEPENDENT_AMBULATORY_CARE_PROVIDER_SITE_OTHER): Payer: 59 | Admitting: Pharmacist

## 2018-03-29 ENCOUNTER — Telehealth: Payer: Self-pay | Admitting: *Deleted

## 2018-03-29 DIAGNOSIS — Z7901 Long term (current) use of anticoagulants: Secondary | ICD-10-CM

## 2018-03-29 DIAGNOSIS — D6859 Other primary thrombophilia: Secondary | ICD-10-CM | POA: Diagnosis not present

## 2018-03-29 DIAGNOSIS — Z86718 Personal history of other venous thrombosis and embolism: Secondary | ICD-10-CM | POA: Diagnosis not present

## 2018-03-29 DIAGNOSIS — Z5181 Encounter for therapeutic drug level monitoring: Secondary | ICD-10-CM | POA: Diagnosis not present

## 2018-03-29 DIAGNOSIS — I742 Embolism and thrombosis of arteries of the upper extremities: Secondary | ICD-10-CM

## 2018-03-29 LAB — POCT INR: INR: 4.4 — AB (ref 2.0–3.0)

## 2018-03-29 LAB — PROTIME-INR
INR: 4.38
Prothrombin Time: 41.5 seconds — ABNORMAL HIGH (ref 11.4–15.2)

## 2018-03-29 NOTE — Telephone Encounter (Signed)
Venous drawn sample INR 4.38. Denies any bleeding symptoms/signs. Denies any new medications--and certainly "No antibiotics". States she has history of IBD and only thing "new" is her son and DIL have been in the home. OMIT today's dose of warfarin. Decrease from 52.5mg /wk to 47.5mg /wk warfarin, repeat INR in 1 week in [facility]. She was advised to call me or report to the ED for any sign or symptom(s) of bleeding--to which she agreed, and read-back her instructions for the coming week.

## 2018-03-29 NOTE — Telephone Encounter (Signed)
Have reviewed INR 4.38 Will discuss with Dr. Wilkie Aye and call patient to advise of dose going forward.

## 2018-03-29 NOTE — Progress Notes (Signed)
Reviewed Thanks DrG 

## 2018-03-29 NOTE — Telephone Encounter (Signed)
Main lab calls now to state  CRITICAL INR  4.38 Sending to dr Elie Confer and Kenney Houseman m

## 2018-03-29 NOTE — Progress Notes (Signed)
Anticoagulation Management Tracy Peters is a 84 y.o. female who reports to the clinic for monitoring of warfarin treatment.    Indication: Chronic anticoagulation, history of DVT, History of thrombosis of right radial artery; primary hypercoagulable state; long term current use of anticoagulant.    Duration: indefinite Supervising physician: Murriel Hopper  Anticoagulation Clinic Visit History: Patient does not report signs/symptoms of bleeding or thromboembolism  Other recent changes: No diet, medications, lifestyle changes except as noted in patient findings.  Anticoagulation Episode Summary    Current INR goal:   2.0-3.0  TTR:   70.3 % (6.7 y)  Next INR check:   04/05/2018  INR from last check:   4.38! (03/29/2018)  Weekly max warfarin dose:     Target end date:   Indefinite  INR check location:   Anticoagulation Clinic  Preferred lab:     Send INR reminders to:   ANTICOAG IMP   Indications   Chronic anticoagulation [Z79.01] DVT HX OF [Z86.718] 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HCC) [I74.2] Primary hypercoagulable state ([place]) [D68.59] [D68.59] Long term (current) use of anticoagulants [Z79.01] [Z79.01]       Comments:         Anticoagulation Care Providers    Provider Role Specialty Phone number   Annia Belt, MD Referring Oncology [phone]      Allergies  Allergen Reactions  . Bee Venom Anaphylaxis and Other (See Comments)    Yellow jackets, wasps as well  . Ivp Dye [Iodinated Diagnostic Agents] Anaphylaxis  . Shellfish Allergy Anaphylaxis  . Temazepam Other (See Comments)    Severe Hallucinations and Paranoia  . Amlodipine Besylate Swelling  . Aspirin Other (See Comments)    REACTION: unspecified  . Atorvastatin Diarrhea  . Cholestyramine Other (See Comments)    REACTION: mouth irritation  . Codeine Phosphate Nausea And Vomiting  . Demerol [Meperidine] Other (See Comments)    Severe Hallucination!!!!  . Diphenhydramine Hcl Other (See  Comments)    hyperactive  . Doxycycline Nausea Only  . Gentamicin Sulfate Other (See Comments)    REACTION: unspecified  . Keppra [Levetiracetam]     Made her too groggy/ sleepy  . Meclizine Hcl Other (See Comments)    REACTION: more dizziness on it  . Meperidine Hcl Other (See Comments)    Unknown  . Metronidazole Other (See Comments)    REACTION: bad taste  . Moxifloxacin Other (See Comments)    REACTION: insomnia Able to use Cipro  . Sulfamethoxazole Other (See Comments)    Kidney problems  . Penicillins Swelling, Rash and Other (See Comments)    Has patient had a PCN reaction causing immediate rash, facial/tongue/throat swelling, SOB or lightheadedness with hypotension: Yes Has patient had a PCN reaction causing severe rash involving mucus membranes or skin necrosis: No Has patient had a PCN reaction that required hospitalization: No Has patient had a PCN reaction occurring within the last 10 years: No If all of the above answers are "NO", then may proceed with Cephalosporin use.    Prior to Admission medications   Medication Sig Start Date End Date Taking? Authorizing Provider  divalproex (DEPAKOTE) 250 MG DR tablet 0.5 tab bid 03/05/18  Yes Plotnikov, Evie Lacks, MD  docusate sodium (COLACE) 100 MG capsule Take 1 capsule (100 mg total) by mouth daily as needed for mild constipation. 10/22/17  Yes Rai, Ripudeep K, MD  EPINEPHrine 0.3 mg/0.3 mL IJ SOAJ injection As dirrected 01/27/17  Yes Plotnikov, Evie Lacks, MD  ferrous sulfate  325 (65 FE) MG tablet Take 1 tablet (325 mg total) by mouth 3 (three) times daily after meals. Patient taking differently: Take 325 mg by mouth every other day.  06/18/17  Yes Babish, Rodman Key, PA-C  fish oil-omega-3 fatty acids 1000 MG capsule Take 2 g by mouth daily.    Yes [provider]  fluticasone (FLONASE) 50 MCG/ACT nasal spray Place 2 sprays into both nostrils daily. Patient taking differently: Place 2 sprays into both nostrils daily as  needed for allergies or rhinitis.  07/29/17  Yes Plotnikov, Evie Lacks, MD  losartan (COZAAR) 100 MG tablet Take 0.5 tablets (50 mg total) by mouth daily. 01/12/18  Yes Plotnikov, Evie Lacks, MD  mupirocin ointment (BACTROBAN) 2 % Apply topically daily. Cleanse wound to right lower leg with NS.  Apply mupirocin ointment to wound.  Cover with 4x4 gauze and wrap with kerlix/tape.  Change daily. 10/23/17  Yes Rai, Ripudeep K, MD  valACYclovir (VALTREX) 1000 MG tablet TAKE 1 TABLET BY MOUTH TWICE DAILY. 02/07/18  Yes Plotnikov, Evie Lacks, MD  warfarin (COUMADIN) 5 MG tablet Take 1.5 tablets (7.5 mg total) by mouth daily at 6 PM. 01/25/18 04/25/18 Yes Pennie Banter, RPH-CPP  zolpidem (AMBIEN) 10 MG tablet 1 & 1/2 TABLETS (15MG ) BY MOUTH AT BEDTIME AS NEEDED FOR SLEEP, OK TOREPEAT IN 4HRS 03/05/18  Yes Plotnikov, Evie Lacks, MD   Past Medical History:  Diagnosis Date  . Adenomatous colon polyp   . Allergic rhinitis    uses FLonase nightly  . Anemia   . Arthritis    joint pain  . Blood transfusion   . Bronchitis    hx of-7-57yrs ago  . Carpal tunnel syndrome    numbness and tingling   . Cataracts, bilateral   . Chronic anticoagulation 03/21/2013  . Congenital absence of one kidney   . Diarrhea    takes Immodium daily as needed or Lomotil   . Diverticulosis   . GERD (gastroesophageal reflux disease)    mild  . Herpes    takes Valtrex daily  . History of colon polyps   . History of DVT (deep vein thrombosis) 1998   right arm  . History of staph infection   . Hyperlipidemia    Medical MD doesn't think its absolutely necessary  . Hypertension    takes Losartan daily  . IBS (irritable bowel syndrome)    takes Electronics engineer daily  . Insomnia    takes Ambien nightly as needed  . LBP (low back pain)   . Long term (current) use of anticoagulants    Dr. Beryle Beams  . Nocturia   . Osteoporosis   . Raynaud's disease   . 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 Winfield Cunas  . Seizures ([place])  10/20/2017  . 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 Urinary leakage   . UTI (urinary tract infection)    Social History   Socioeconomic History  . Marital status: Married    Spouse name: Not on file  . Number of children: 2  . Years of education: Not on file  . Highest education level: Not on file  Occupational History  . Occupation: Retired    Fish farm manager: RETIRED  Social Needs  . Financial resource strain: Not on file  . Food insecurity:    Worry: Not on file    Inability: Not on file  . Transportation needs:    Medical: Not on  file    Non-medical: Not on file  Tobacco Use  . Smoking status: Never Smoker  . Smokeless tobacco: Never Used  Substance and Sexual Activity  . Alcohol use: No    Alcohol/week: 0.0 standard drinks  . Drug use: No  . Sexual activity: Never  Lifestyle  . Physical activity:    Days per week: Not on file    Minutes per session: Not on file  . Stress: Not on file  Relationships  . Social connections:    Talks on phone: Not on file    Gets together: Not on file    Attends religious service: Not on file    Active member of club or organization: Not on file    Attends meetings of clubs or organizations: Not on file    Relationship status: Not on file  Other Topics Concern  . Not on file  Social History Narrative   Lives home with husband.  Education- graduate level.  Children 2.     Family History  Problem Relation Age of Onset  . Breast cancer Sister   . Kidney disease Sister 25       nephritis  . Parkinsonism Mother   . Heart disease Brother   . Osteoarthritis Other   . Ulcerative colitis Sister   . Kidney disease Sister   . Colon cancer Neg Hx     ASSESSMENT Recent Results: The most recent result is correlated with 52.5 mg per week: Lab Results  Component Value Date   INR 4.4 (A) 03/29/2018   INR 4.38 (HH) 03/29/2018   INR 2.4 03/01/2018   PROTIME 39.6 (H) 04/03/2015     Anticoagulation Dosing: Description   Take 1 & 1/2 tablets of your 5mg  peach-colored warfarin tablets by mouth, once-daily at [facility] on Mondays, Wednesdays, Thursdays, Saturdays and Sundays; On Tuesdays and Fridays--take ONLY one (1) of your 5mg  peach-colored warfarin tablets. OMIT today's dose (Monday, 14-OCT-19).     DR Azucena Freed PATIENT (route warfarin notes and list as authorizing provider for LOS & Follow-up, lab orders and warfarin prescriptions), do not list attending physician      INR today: Supratherapeutic  PLAN Weekly dose was decreased by 10% to 47.5 mg per week  Patient Instructions  Patient instructed to take medications as defined in the Anti-coagulation Track section of this encounter.  Patient instructed to OMIT today's dose (Monday, 14-OCT-19).   Patient instructed to take 1 & 1/2 tablets of your 5mg  peach-colored warfarin tablets by mouth, once-daily at [facility] on Mondays, Wednesdays, Thursdays, Saturdays and Sundays; On Tuesdays and Fridays--take ONLY one (1) of your 5mg  peach-colored warfarin tablets. OMIT today's dose (Monday, 14-OCT-19) Patient verbalized understanding of these instructions.     Patient advised to contact clinic or seek medical attention if signs/symptoms of bleeding or thromboembolism occur.  Patient verbalized understanding by repeating back information and was advised to contact me if further medication-related questions arise. Patient was also provided an information handout.  Follow-up Return in about 1 week (around 04/05/2018) for Follow up INR at 1130h.  Pennie Banter, PharmD, CPP  15 minutes spent face-to-face with the patient during the encounter. 50% of time spent on education. 50% of time was spent on fingerstick point of care INR sample collection, processing, results determination, dose adjustment and documentation in [email or web address]/.

## 2018-03-29 NOTE — Patient Instructions (Signed)
Patient instructed to take medications as defined in the Anti-coagulation Track section of this encounter.  Patient instructed to OMIT today's dose (Monday, 14-OCT-19).   Patient instructed to take 1 & 1/2 tablets of your 5mg  peach-colored warfarin tablets by mouth, once-daily at [facility] on Mondays, Wednesdays, Thursdays, Saturdays and Sundays; On Tuesdays and Fridays--take ONLY one (1) of your 5mg  peach-colored warfarin tablets. OMIT today's dose (Monday, 14-OCT-19) Patient verbalized understanding of these instructions.

## 2018-04-05 ENCOUNTER — Ambulatory Visit: Payer: 59

## 2018-04-06 ENCOUNTER — Ambulatory Visit (INDEPENDENT_AMBULATORY_CARE_PROVIDER_SITE_OTHER): Payer: 59 | Admitting: Pharmacist

## 2018-04-06 DIAGNOSIS — Z5181 Encounter for therapeutic drug level monitoring: Secondary | ICD-10-CM

## 2018-04-06 DIAGNOSIS — Z86718 Personal history of other venous thrombosis and embolism: Secondary | ICD-10-CM

## 2018-04-06 DIAGNOSIS — Z7901 Long term (current) use of anticoagulants: Secondary | ICD-10-CM | POA: Diagnosis not present

## 2018-04-06 DIAGNOSIS — D6859 Other primary thrombophilia: Secondary | ICD-10-CM | POA: Diagnosis not present

## 2018-04-06 DIAGNOSIS — I742 Embolism and thrombosis of arteries of the upper extremities: Secondary | ICD-10-CM

## 2018-04-06 LAB — POCT INR: INR: 3 (ref 2.0–3.0)

## 2018-04-06 MED ORDER — WARFARIN SODIUM 5 MG PO TABS: ORAL_TABLET | ORAL | 0 refills | Status: DC

## 2018-04-06 NOTE — Patient Instructions (Signed)
Patient instructed to take medications as defined in the Anti-coagulation Track section of this encounter.  Patient instructed to OMIT today's dose.  Patient instructed to, commencing tomorrow, Wednesday 23-OCT-19 take 1 & 1/2 tablets of your 5mg  peach-colored warfarin tablets by mouth, once-daily at [facility] on Sundays, Tuesdays, Thursdays and Saturdays; All other days--take ONLY one (1) of your 5mg  peach-colored warfarin tablets. Patient verbalized understanding of these instructions.

## 2018-04-06 NOTE — Progress Notes (Signed)
No current result in your note

## 2018-04-06 NOTE — Progress Notes (Signed)
Anticoagulation Management Tracy Peters is a 84 y.o. female who reports to the clinic for monitoring of warfarin treatment.    Indication: Chronic anticoagulation, thrombosis of right radial artery (resolved), primary hypercoagulable state.   Duration: indefinite Supervising physician: Murriel Hopper  Anticoagulation Clinic Visit History: Patient does not report signs/symptoms of bleeding or thromboembolism  Other recent changes: No diet, medications, lifestyle changes endorsed at this visit.  Anticoagulation Episode Summary    Current INR goal:   2.0-3.0  TTR:   70.0 % (6.7 y)  Next INR check:   04/19/2018  INR from last check:     Weekly max warfarin dose:     Target end date:   Indefinite  INR check location:   Anticoagulation Clinic  Preferred lab:     Send INR reminders to:   ANTICOAG IMP   Indications   Chronic anticoagulation [Z79.01] DVT HX OF [Z86.718] 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HCC) [I74.2] Primary hypercoagulable state ([place]) [D68.59] [D68.59] Long term (current) use of anticoagulants [Z79.01] [Z79.01]       Comments:         Anticoagulation Care Providers    Provider Role Specialty Phone number   Annia Belt, MD Referring Oncology [phone]      Allergies  Allergen Reactions  . Bee Venom Anaphylaxis and Other (See Comments)    Yellow jackets, wasps as well  . Ivp Dye [Iodinated Diagnostic Agents] Anaphylaxis  . Shellfish Allergy Anaphylaxis  . Temazepam Other (See Comments)    Severe Hallucinations and Paranoia  . Amlodipine Besylate Swelling  . Aspirin Other (See Comments)    REACTION: unspecified  . Atorvastatin Diarrhea  . Cholestyramine Other (See Comments)    REACTION: mouth irritation  . Codeine Phosphate Nausea And Vomiting  . Demerol [Meperidine] Other (See Comments)    Severe Hallucination!!!!  . Diphenhydramine Hcl Other (See Comments)    hyperactive  . Doxycycline Nausea Only  . Gentamicin Sulfate Other (See  Comments)    REACTION: unspecified  . Keppra [Levetiracetam]     Made her too groggy/ sleepy  . Meclizine Hcl Other (See Comments)    REACTION: more dizziness on it  . Meperidine Hcl Other (See Comments)    Unknown  . Metronidazole Other (See Comments)    REACTION: bad taste  . Moxifloxacin Other (See Comments)    REACTION: insomnia Able to use Cipro  . Sulfamethoxazole Other (See Comments)    Kidney problems  . Penicillins Swelling, Rash and Other (See Comments)    Has patient had a PCN reaction causing immediate rash, facial/tongue/throat swelling, SOB or lightheadedness with hypotension: Yes Has patient had a PCN reaction causing severe rash involving mucus membranes or skin necrosis: No Has patient had a PCN reaction that required hospitalization: No Has patient had a PCN reaction occurring within the last 10 years: No If all of the above answers are "NO", then may proceed with Cephalosporin use.    Prior to Admission medications   Medication Sig Start Date End Date Taking? Authorizing Provider  divalproex (DEPAKOTE) 250 MG DR tablet 0.5 tab bid 03/05/18  Yes Plotnikov, Evie Lacks, MD  docusate sodium (COLACE) 100 MG capsule Take 1 capsule (100 mg total) by mouth daily as needed for mild constipation. 10/22/17  Yes Rai, Ripudeep K, MD  EPINEPHrine 0.3 mg/0.3 mL IJ SOAJ injection As dirrected 01/27/17  Yes Plotnikov, Evie Lacks, MD  ferrous sulfate 325 (65 FE) MG tablet Take 1 tablet (325 mg total) by mouth 3 (  three) times daily after meals. Patient taking differently: Take 325 mg by mouth every other day.  06/18/17  Yes Babish, Rodman Key, PA-C  fish oil-omega-3 fatty acids 1000 MG capsule Take 2 g by mouth daily.    Yes [provider]  fluticasone (FLONASE) 50 MCG/ACT nasal spray Place 2 sprays into both nostrils daily. Patient taking differently: Place 2 sprays into both nostrils daily as needed for allergies or rhinitis.  07/29/17  Yes Plotnikov, Evie Lacks, MD  losartan (COZAAR)  100 MG tablet Take 0.5 tablets (50 mg total) by mouth daily. 01/12/18  Yes Plotnikov, Evie Lacks, MD  mupirocin ointment (BACTROBAN) 2 % Apply topically daily. Cleanse wound to right lower leg with NS.  Apply mupirocin ointment to wound.  Cover with 4x4 gauze and wrap with kerlix/tape.  Change daily. 10/23/17  Yes Rai, Ripudeep K, MD  valACYclovir (VALTREX) 1000 MG tablet TAKE 1 TABLET BY MOUTH TWICE DAILY. 02/07/18  Yes Plotnikov, Evie Lacks, MD  warfarin (COUMADIN) 5 MG tablet Take 1 and 1/2 tablets on Sundays, Tuesdays, Thursdays and Saturdays. All other days, take only one (1) tablet. 04/06/18  Yes Pennie Banter, RPH-CPP  zolpidem (AMBIEN) 10 MG tablet 1 & 1/2 TABLETS (15MG ) BY MOUTH AT BEDTIME AS NEEDED FOR SLEEP, OK TOREPEAT IN 4HRS 03/05/18  Yes Plotnikov, Evie Lacks, MD   Past Medical History:  Diagnosis Date  . Adenomatous colon polyp   . Allergic rhinitis    uses FLonase nightly  . Anemia   . Arthritis    joint pain  . Blood transfusion   . Bronchitis    hx of-7-61yrs ago  . Carpal tunnel syndrome    numbness and tingling   . Cataracts, bilateral   . Chronic anticoagulation 03/21/2013  . Congenital absence of one kidney   . Diarrhea    takes Immodium daily as needed or Lomotil   . Diverticulosis   . GERD (gastroesophageal reflux disease)    mild  . Herpes    takes Valtrex daily  . History of colon polyps   . History of DVT (deep vein thrombosis) 1998   right arm  . History of staph infection   . Hyperlipidemia    Medical MD doesn't think its absolutely necessary  . Hypertension    takes Losartan daily  . IBS (irritable bowel syndrome)    takes Electronics engineer daily  . Insomnia    takes Ambien nightly as needed  . LBP (low back pain)   . Long term (current) use of anticoagulants    Dr. Beryle Beams  . Nocturia   . Osteoporosis   . Raynaud's disease   . 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 Winfield Cunas  . Seizures ([place]) 10/20/2017  . Thrombosis of right radial artery  ([place]) 09/16/2011   Right digital artery 1998 idiopathic   . Urinary leakage   . UTI (urinary tract infection)    Social History   Socioeconomic History  . Marital status: Married    Spouse name: Not on file  . Number of children: 2  . Years of education: Not on file  . Highest education level: Not on file  Occupational History  . Occupation: Retired    Fish farm manager: RETIRED  Social Needs  . Financial resource strain: Not on file  . Food insecurity:    Worry: Not on file    Inability: Not on file  . Transportation needs:    Medical: Not on file    Non-medical: Not on  file  Tobacco Use  . Smoking status: Never Smoker  . Smokeless tobacco: Never Used  Substance and Sexual Activity  . Alcohol use: No    Alcohol/week: 0.0 standard drinks  . Drug use: No  . Sexual activity: Never  Lifestyle  . Physical activity:    Days per week: Not on file    Minutes per session: Not on file  . Stress: Not on file  Relationships  . Social connections:    Talks on phone: Not on file    Gets together: Not on file    Attends religious service: Not on file    Active member of club or organization: Not on file    Attends meetings of clubs or organizations: Not on file    Relationship status: Not on file  Other Topics Concern  . Not on file  Social History Narrative   Lives home with husband.  Education- graduate level.  Children 2.     Family History  Problem Relation Age of Onset  . Breast cancer Sister   . Kidney disease Sister 97       nephritis  . Parkinsonism Mother   . Heart disease Brother   . Osteoarthritis Other   . Ulcerative colitis Sister   . Kidney disease Sister   . Colon cancer Neg Hx     ASSESSMENT Recent Results: The most recent result is correlated with 47.5 mg per week: Lab Results  Component Value Date   INR 3.0 04/06/2018   INR 4.4 (A) 03/29/2018   INR 4.38 (HH) 03/29/2018   PROTIME 39.6 (H) 04/03/2015    Anticoagulation Dosing: Description   Take 1 &  1/2 tablets of your 5mg  peach-colored warfarin tablets by mouth, once-daily at [facility] on Sundays, Tuesdays, Thursdays and Saturdays; All other days--take ONLY one (1) of your 5mg  peach-colored warfarin tablets.   DR Azucena Freed PATIENT (route warfarin notes and list as authorizing provider for LOS & Follow-up, lab orders and warfarin prescriptions), do not list attending physician      INR today: Therapeutic  PLAN Weekly dose was decreased by 5% to 45 mg per week  Patient Instructions  Patient instructed to take medications as defined in the Anti-coagulation Track section of this encounter.  Patient instructed to OMIT today's dose.  Patient instructed to, commencing tomorrow, Wednesday 23-OCT-19 take 1 & 1/2 tablets of your 5mg  peach-colored warfarin tablets by mouth, once-daily at [facility] on Sundays, Tuesdays, Thursdays and Saturdays; All other days--take ONLY one (1) of your 5mg  peach-colored warfarin tablets. Patient verbalized understanding of these instructions.     Patient advised to contact clinic or seek medical attention if signs/symptoms of bleeding or thromboembolism occur.  Patient verbalized understanding by repeating back information and was advised to contact me if further medication-related questions arise. Patient was also provided an information handout.  Follow-up Return in 13 days (on 04/19/2018) for Follow up INR at 1130h.  Pennie Banter, PharmD, CPP  15 minutes spent face-to-face with the patient during the encounter. 50% of time spent on education. 50% of time was spent on fingerstick point of care INR sample collection, processing, results determination, dose adjustment, prescription refill authorization/transmission to her pharmacy and documentation in [email or web address]

## 2018-04-19 ENCOUNTER — Ambulatory Visit (INDEPENDENT_AMBULATORY_CARE_PROVIDER_SITE_OTHER): Payer: 59 | Admitting: Pharmacist

## 2018-04-19 DIAGNOSIS — Z5181 Encounter for therapeutic drug level monitoring: Secondary | ICD-10-CM

## 2018-04-19 DIAGNOSIS — I742 Embolism and thrombosis of arteries of the upper extremities: Secondary | ICD-10-CM

## 2018-04-19 DIAGNOSIS — Z7901 Long term (current) use of anticoagulants: Secondary | ICD-10-CM | POA: Diagnosis not present

## 2018-04-19 DIAGNOSIS — Z86718 Personal history of other venous thrombosis and embolism: Secondary | ICD-10-CM

## 2018-04-19 DIAGNOSIS — D6859 Other primary thrombophilia: Secondary | ICD-10-CM | POA: Diagnosis not present

## 2018-04-19 LAB — POCT INR: INR: 3 (ref 2.0–3.0)

## 2018-04-19 NOTE — Progress Notes (Signed)
Anticoagulation Management Tracy Peters is a 84 y.o. female who reports to the clinic for monitoring of warfarin treatment.    Indication: History of thrombosis of right radial artery, Primary hypercoagulable state, long term current use of anticoagulant.    Duration: indefinite Supervising physician: Murriel Hopper  Anticoagulation Clinic Visit History: Patient does not report signs/symptoms of bleeding or thromboembolism  Other recent changes: No diet, medications, lifestyle changes except as noted in patient findings.  Anticoagulation Episode Summary    Current INR goal:   2.0-3.0  TTR:   70.2 % (6.8 y)  Next INR check:   05/10/2018  INR from last check:   3.0 (04/19/2018)  Weekly max warfarin dose:     Target end date:   Indefinite  INR check location:   Anticoagulation Clinic  Preferred lab:     Send INR reminders to:   ANT[facility]   Indications   Chronic anticoagulation [Z79.01] DVT HX OF [Z86.718] 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HCC) [I74.2] Primary hypercoagulable state ([place]) [D68.59] [D68.59] Long term (current) use of anticoagulants [Z79.01] [Z79.01]       Comments:         Anticoagulation Care Providers    Provider Role Specialty Phone number   Annia Belt, MD Referring Oncology [phone]      Allergies  Allergen Reactions  . Bee Venom Anaphylaxis and Other (See Comments)    Yellow jackets, wasps as well  . Ivp Dye [Iodinated Diagnostic Agents] Anaphylaxis  . Shellfish Allergy Anaphylaxis  . Temazepam Other (See Comments)    Severe Hallucinations and Paranoia  . Amlodipine Besylate Swelling  . Aspirin Other (See Comments)    REACTION: unspecified  . Atorvastatin Diarrhea  . Cholestyramine Other (See Comments)    REACTION: mouth irritation  . Codeine Phosphate Nausea And Vomiting  . Demerol [Meperidine] Other (See Comments)    Severe Hallucination!!!!  . Diphenhydramine Hcl Other (See Comments)    hyperactive  . Doxycycline  Nausea Only  . Gentamicin Sulfate Other (See Comments)    REACTION: unspecified  . Keppra [Levetiracetam]     Made her too groggy/ sleepy  . Meclizine Hcl Other (See Comments)    REACTION: more dizziness on it  . Meperidine Hcl Other (See Comments)    Unknown  . Metronidazole Other (See Comments)    REACTION: bad taste  . Moxifloxacin Other (See Comments)    REACTION: insomnia Able to use Cipro  . Sulfamethoxazole Other (See Comments)    Kidney problems  . Penicillins Swelling, Rash and Other (See Comments)    Has patient had a PCN reaction causing immediate rash, facial/tongue/throat swelling, SOB or lightheadedness with hypotension: Yes Has patient had a PCN reaction causing severe rash involving mucus membranes or skin necrosis: No Has patient had a PCN reaction that required hospitalization: No Has patient had a PCN reaction occurring within the last 10 years: No If all of the above answers are "NO", then may proceed with Cephalosporin use.    Prior to Admission medications   Medication Sig Start Date End Date Taking? Authorizing Provider  divalproex (DEPAKOTE) 250 MG DR tablet 0.5 tab bid 03/05/18  Yes Plotnikov, Evie Lacks, MD  docusate sodium (COLACE) 100 MG capsule Take 1 capsule (100 mg total) by mouth daily as needed for mild constipation. 10/22/17  Yes Rai, Ripudeep K, MD  EPINEPHrine 0.3 mg/0.3 mL IJ SOAJ injection As dirrected 01/27/17  Yes Plotnikov, Evie Lacks, MD  ferrous sulfate 325 (65 FE) MG tablet  Take 1 tablet (325 mg total) by mouth 3 (three) times daily after meals. Patient taking differently: Take 325 mg by mouth every other day.  06/18/17  Yes Babish, Rodman Key, PA-C  fish oil-omega-3 fatty acids 1000 MG capsule Take 2 g by mouth daily.    Yes [provider]  fluticasone (FLONASE) 50 MCG/ACT nasal spray Place 2 sprays into both nostrils daily. Patient taking differently: Place 2 sprays into both nostrils daily as needed for allergies or rhinitis.  07/29/17   Yes Plotnikov, Evie Lacks, MD  losartan (COZAAR) 100 MG tablet Take 0.5 tablets (50 mg total) by mouth daily. 01/12/18  Yes Plotnikov, Evie Lacks, MD  mupirocin ointment (BACTROBAN) 2 % Apply topically daily. Cleanse wound to right lower leg with NS.  Apply mupirocin ointment to wound.  Cover with 4x4 gauze and wrap with kerlix/tape.  Change daily. 10/23/17  Yes Rai, Ripudeep K, MD  valACYclovir (VALTREX) 1000 MG tablet TAKE 1 TABLET BY MOUTH TWICE DAILY. 02/07/18  Yes Plotnikov, Evie Lacks, MD  warfarin (COUMADIN) 5 MG tablet Take 1 and 1/2 tablets on Sundays, Tuesdays, Thursdays and Saturdays. All other days, take only one (1) tablet. 04/06/18  Yes Pennie Banter, RPH-CPP  zolpidem (AMBIEN) 10 MG tablet 1 & 1/2 TABLETS (15MG ) BY MOUTH AT BEDTIME AS NEEDED FOR SLEEP, OK TOREPEAT IN 4HRS 03/05/18  Yes Plotnikov, Evie Lacks, MD   Past Medical History:  Diagnosis Date  . Adenomatous colon polyp   . Allergic rhinitis    uses FLonase nightly  . Anemia   . Arthritis    joint pain  . Blood transfusion   . Bronchitis    hx of-7-38yrs ago  . Carpal tunnel syndrome    numbness and tingling   . Cataracts, bilateral   . Chronic anticoagulation 03/21/2013  . Congenital absence of one kidney   . Diarrhea    takes Immodium daily as needed or Lomotil   . Diverticulosis   . GERD (gastroesophageal reflux disease)    mild  . Herpes    takes Valtrex daily  . History of colon polyps   . History of DVT (deep vein thrombosis) 1998   right arm  . History of staph infection   . Hyperlipidemia    Medical MD doesn't think its absolutely necessary  . Hypertension    takes Losartan daily  . IBS (irritable bowel syndrome)    takes Electronics engineer daily  . Insomnia    takes Ambien nightly as needed  . LBP (low back pain)   . Long term (current) use of anticoagulants    Dr. Beryle Beams  . Nocturia   . Osteoporosis   . Raynaud's disease   . 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 Winfield Cunas  . Seizures ([place])  10/20/2017  . 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[place]) 09/16/2011   Right digital artery 1998 idiopathic   . Urinary leakage   . UTI (urinary tract infection)    Social History   Socioeconomic History  . Marital status: Married    Spouse name: Not on file  . Number of children: 2  . Years of education: Not on file  . Highest education level: Not on file  Occupational History  . Occupation: Retired    Fish farm manager: RETIRED  Social Needs  . Financial resource strain: Not on file  . Food insecurity:    Worry: Not on file    Inability: Not on file  . Transportation needs:    Medical:  Not on file    Non-medical: Not on file  Tobacco Use  . Smoking status: Never Smoker  . Smokeless tobacco: Never Used  Substance and Sexual Activity  . Alcohol use: No    Alcohol/week: 0.0 standard drinks  . Drug use: No  . Sexual activity: Never  Lifestyle  . Physical activity:    Days per week: Not on file    Minutes per session: Not on file  . Stress: Not on file  Relationships  . Social connections:    Talks on phone: Not on file    Gets together: Not on file    Attends religious service: Not on file    Active member of club or organization: Not on file    Attends meetings of clubs or organizations: Not on file    Relationship status: Not on file  Other Topics Concern  . Not on file  Social History Narrative   Lives home with husband.  Education- graduate level.  Children 2.     Family History  Problem Relation Age of Onset  . Breast cancer Sister   . Kidney disease Sister 4       nephritis  . Parkinsonism Mother   . Heart disease Brother   . Osteoarthritis Other   . Ulcerative colitis Sister   . Kidney disease Sister   . Colon cancer Neg Hx     ASSESSMENT Recent Results: The most recent result is correlated with 45 mg per week: Lab Results  Component Value Date   INR 3.0 04/19/2018   INR 3.0 04/06/2018   INR 4.4 (A) 03/29/2018   PROTIME 39.6 (H) 04/03/2015     Anticoagulation Dosing: Description   Take 1 & 1/2 tablets of your 5mg  peach-colored warfarin tablets by mouth, once-daily at [facility] on Sundays, and Thursdays. All other days--take ONLY one (1) of your 5mg  peach-colored warfarin tablets.   DR Azucena Freed PATIENT (route warfarin notes and list as authorizing provider for LOS & Follow-up, lab orders and warfarin prescriptions), do not list attending physician      INR today: Therapeutic  PLAN Weekly dose was decreased by 11% to 40 mg per week  Patient Instructions  Patient instructed to take medications as defined in the Anti-coagulation Track section of this encounter.  Patient instructed to take today's dose.  Patient instructed to take 1 & 1/2 tablets of your 5mg  peach-colored warfarin tablets by mouth, once-daily at [facility] on Sundays, and Thursdays. All other days--take ONLY one (1) of your 5mg  peach-colored warfarin tablets.  Patient verbalized understanding of these instructions.     Patient advised to contact clinic or seek medical attention if signs/symptoms of bleeding or thromboembolism occur.  Patient verbalized understanding by repeating back information and was advised to contact me if further medication-related questions arise. Patient was also provided an information handout.  Follow-up Return in 3 weeks (on 05/10/2018) for Follow up INR at 1130h.  Pennie Banter, PharmD, CACP, CPP  15 minutes spent face-to-face with the patient during the encounter. 50% of time spent on education. 50% of time was spent on fingerstick point of care INR sample collection, processing, results determination, dose adjustment and documentation in http://[email or web address].net/.

## 2018-04-19 NOTE — Patient Instructions (Signed)
Patient instructed to take medications as defined in the Anti-coagulation Track section of this encounter.  Patient instructed to take today's dose.  Patient instructed to take 1 & 1/2 tablets of your 5mg  peach-colored warfarin tablets by mouth, once-daily at [facility] on Sundays, and Thursdays. All other days--take ONLY one (1) of your 5mg  peach-colored warfarin tablets.  Patient verbalized understanding of these instructions.

## 2018-04-19 NOTE — Progress Notes (Signed)
Reviewed thx DrG 

## 2018-04-20 ENCOUNTER — Encounter: Payer: Self-pay | Admitting: Internal Medicine

## 2018-04-20 ENCOUNTER — Ambulatory Visit (INDEPENDENT_AMBULATORY_CARE_PROVIDER_SITE_OTHER): Payer: 59 | Admitting: Internal Medicine

## 2018-04-20 ENCOUNTER — Other Ambulatory Visit (INDEPENDENT_AMBULATORY_CARE_PROVIDER_SITE_OTHER): Payer: 59

## 2018-04-20 DIAGNOSIS — E785 Hyperlipidemia, unspecified: Secondary | ICD-10-CM

## 2018-04-20 DIAGNOSIS — R197 Diarrhea, unspecified: Secondary | ICD-10-CM | POA: Diagnosis not present

## 2018-04-20 DIAGNOSIS — I742 Embolism and thrombosis of arteries of the upper extremities: Secondary | ICD-10-CM

## 2018-04-20 DIAGNOSIS — G4700 Insomnia, unspecified: Secondary | ICD-10-CM

## 2018-04-20 DIAGNOSIS — D500 Iron deficiency anemia secondary to blood loss (chronic): Secondary | ICD-10-CM

## 2018-04-20 DIAGNOSIS — Z86718 Personal history of other venous thrombosis and embolism: Secondary | ICD-10-CM

## 2018-04-20 DIAGNOSIS — E871 Hypo-osmolality and hyponatremia: Secondary | ICD-10-CM

## 2018-04-20 LAB — BASIC METABOLIC PANEL
BUN: 18 mg/dL (ref 6–23)
CALCIUM: 9.7 mg/dL (ref 8.4–10.5)
CHLORIDE: 97 meq/L (ref 96–112)
CO2: 27 meq/L (ref 19–32)
CREATININE: 0.9 mg/dL (ref 0.40–1.20)
GFR: 63.61 mL/min (ref >60.00)
GLUCOSE: 91 mg/dL (ref 70–99)
Potassium: 4.8 mEq/L (ref 3.5–5.1)
SODIUM: 131 meq/L — AB (ref 135–145)

## 2018-04-20 LAB — HEPATIC FUNCTION PANEL
ALK PHOS: 47 U/L (ref 39–117)
ALT: 15 U/L (ref 0–35)
AST: 20 U/L (ref 0–37)
Albumin: 4.3 g/dL (ref 3.5–5.2)
BILIRUBIN DIRECT: 0.1 mg/dL (ref 0.0–0.3)
Total Bilirubin: 0.4 mg/dL (ref 0.2–1.2)
Total Protein: 7.7 g/dL (ref 6.0–8.3)

## 2018-04-20 LAB — URINALYSIS, ROUTINE W REFLEX MICROSCOPIC
Bilirubin Urine: NEGATIVE
Ketones, ur: NEGATIVE
Nitrite: POSITIVE — AB
Specific Gravity, Urine: 1.01 (ref 1.000–1.030)
Total Protein, Urine: 30 — AB
Urine Glucose: NEGATIVE
Urobilinogen, UA: 0.2 (ref 0.0–1.0)
pH: 6 (ref 5.0–8.0)

## 2018-04-20 LAB — TSH: TSH: 2.52 u[IU]/mL (ref 0.35–4.50)

## 2018-04-20 MED ORDER — COLESEVELAM HCL 625 MG PO TABS: 1250.0000 mg | ORAL_TABLET | Freq: Two times a day (BID) | ORAL | 11 refills | Status: DC

## 2018-04-20 MED ORDER — TRIAMCINOLONE ACETONIDE 0.1 % EX OINT: 1.0000 "application " | TOPICAL_OINTMENT | Freq: Three times a day (TID) | CUTANEOUS | 1 refills | Status: DC

## 2018-04-20 NOTE — Assessment & Plan Note (Signed)
Labs

## 2018-04-20 NOTE — Patient Instructions (Signed)
  Gluten free trial for 4-6 weeks. OK to use gluten-free bread and gluten-free pasta.    Gluten-Free Diet for Celiac Disease, Adult The gluten-free diet includes all foods that do not contain gluten. Gluten is a protein that is found in wheat, rye, barley, and some other grains. Following the gluten-free diet is the only treatment for people with celiac disease. It helps to prevent damage to the intestines and improves or eliminates the symptoms of celiac disease. Following the gluten-free diet requires some planning. It can be challenging at first, but it gets easier with time and practice. There are more gluten-free options available today than ever before. If you need help finding gluten-free foods or if you have questions, talk with your diet and nutrition specialist (registered dietitian) or your health care provider. What do I need to know about a gluten-free diet?  All fruits, vegetables, and meats are safe to eat and do not contain gluten.  When grocery shopping, start by shopping in the produce, meat, and dairy sections. These sections are more likely to contain gluten-free foods. Then move to the aisles that contain packaged foods if you need to.  Read all food labels. Gluten is often added to foods. Always check the ingredient list and look for warnings, such as "may contain gluten."  Talk with your dietitian or health care provider before taking a gluten-free multivitamin or mineral supplement.  Be aware of gluten-free foods having contact with foods that contain gluten (cross-contamination). This can happen at home and with any processed foods. ? Talk with your health care provider or dietitian about how to reduce the risk of cross-contamination in your home. ? If you have questions about how a food is processed, ask the manufacturer. What key words help to identify gluten? Foods that list any of these key words on the label usually contain gluten:  Wheat, flour, enriched  flour, bromated flour, white flour, durum flour, graham flour, phosphated flour, self-rising flour, semolina, farina, barley (malt), rye, and oats.  Starch, dextrin, modified food starch, or cereal.  Thickening, fillers, or emulsifiers.  Malt flavoring, malt extract, or malt syrup.  Hydrolyzed vegetable protein.  In the [place] packaged foods that are gluten-free are required to be labeled "GF." These foods should be easy to identify and are safe to eat. In the [place] food companies are also required to list common food allergens, including wheat, on their labels. Recommended foods Grains  Amaranth, bean flours, 100% buckwheat flour, corn, millet, nut flours or nut meals, GF oats, quinoa, rice, sorghum, teff, rice wafers, pure cornmeal tortillas, popcorn, and hot cereals made from cornmeal. Hominy, rice, wild rice. Some Asian rice noodles or bean noodles. Arrowroot starch, corn bran, corn flour, corn germ, cornmeal, corn starch, potato flour, potato starch flour, and rice bran. Plain, brown, and sweet rice flours. Rice polish, soy flour, and tapioca starch. Vegetables  All plain fresh, frozen, and canned vegetables. Fruits  All plain fresh, frozen, canned, and dried fruits, and 100% fruit juices. Meats and other protein foods  All fresh beef, pork, poultry, fish, seafood, and eggs. Fish canned in water, oil, brine, or vegetable broth. Plain nuts and seeds, peanut butter. Some lunch meat and some frankfurters. Dried beans, dried peas, and lentils. Dairy  Fresh plain, dry, evaporated, or condensed milk. Cream, butter, sour cream, whipping cream, and most yogurts. Unprocessed cheese, most processed cheeses, some cottage cheese, some cream cheeses. Beverages  Coffee, tea, most herbal teas. Carbonated beverages and some root beers.   Wine, sake, and distilled spirits, such as gin, vodka, and whiskey. Most hard ciders. Fats and oils  Butter, margarine, vegetable oil, hydrogenated butter, olive  oil, shortening, lard, cream, and some mayonnaise. Some commercial salad dressings. Olives. Sweets and desserts  Sugar, honey, some syrups, molasses, jelly, and jam. Plain hard candy, marshmallows, and gumdrops. Pure cocoa powder. Plain chocolate. Custard and some pudding mixes. Gelatin desserts, sorbets, frozen ice pops, and sherbet. Cake, cookies, and other desserts prepared with allowed flours. Some commercial ice creams. Cornstarch, tapioca, and rice puddings. Seasoning and other foods  Some canned or frozen soups. Monosodium glutamate (MSG). Cider, rice, and wine vinegar. Baking soda and baking powder. Cream of tartar. Baking and nutritional yeast. Certain soy sauces made without wheat (ask your dietitian about specific brands that are allowed). Nuts, coconut, and chocolate. Salt, pepper, herbs, spices, flavoring extracts, imitation or artificial flavorings, natural flavorings, and food colorings. Some medicines and supplements. Some lip glosses and other cosmetics. Rice syrups. The items listed may not be a complete list. Talk with your dietitian about what dietary choices are best for you. Foods to avoid Grains  Barley, bran, bulgur, couscous, cracked wheat, , farro, graham, malt, matzo, semolina, wheat germ, and all wheat and rye cereals including spelt and kamut. Cereals containing malt as a flavoring, such as rice cereal. Noodles, spaghetti, macaroni, most packaged rice mixes, and all mixes containing wheat, rye, barley, or triticale. Vegetables  Most creamed vegetables and most vegetables canned in sauces. Some commercially prepared vegetables and salads. Fruits  Thickened or prepared fruits and some pie fillings. Some fruit snacks and fruit roll-ups. Meats and other protein foods  Any meat or meat alternative containing wheat, rye, barley, or gluten stabilizers. These are often marinated or packaged meats and lunch meats. Bread-containing products, such as Swiss steak,  croquettes, meatballs, and meatloaf. Most tuna canned in vegetable broth and turkey with hydrolyzed vegetable protein (HVP) injected as part of the basting. Seitan. Imitation fish. Eggs in sauces made from ingredients to avoid. Dairy  Commercial chocolate milk drinks and malted milk. Some non-dairy creamers. Any cheese product containing ingredients to avoid. Beverages  Certain cereal beverages. Beer, ale, malted milk, and some root beers. Some hard ciders. Some instant flavored coffees. Some herbal teas made with barley or with barley malt added. Fats and oils  Some commercial salad dressings. Sour cream containing modified food starch. Sweets and desserts  Some toffees. Chocolate-coated nuts (may be rolled in wheat flour) and some commercial candies and candy bars. Most cakes, cookies, donuts, pastries, and other baked goods. Some commercial ice cream. Ice cream cones. Commercially prepared mixes for cakes, cookies, and other desserts. Bread pudding and other puddings thickened with flour. Products containing brown rice syrup made with barley malt enzyme. Desserts and sweets made with malt flavoring. Seasoning and other foods  Some curry powders, some dry seasoning mixes, some gravy extracts, some meat sauces, some ketchups, some prepared mustards, and horseradish. Certain soy sauces. Malt vinegar. Bouillon and bouillon cubes that contain HVP. Some chip dips, and some chewing gum. Yeast extract. Brewer's yeast. Caramel color. Some medicines and supplements. Some lip glosses and other cosmetics. The items listed may not be a complete list. Talk with your dietitian about what dietary choices are best for you. Summary  Gluten is a protein that is found in wheat, rye, barley, and some other grains. The gluten-free diet includes all foods that do not contain gluten.  If you need help finding gluten-free foods or if   you have questions, talk with your diet and nutrition specialist (registered  dietitian) or your health care provider.  Read all food labels. Gluten is often added to foods. Always check the ingredient list and look for warnings, such as "may contain gluten." This information is not intended to replace advice given to you by your health care provider. Make sure you discuss any questions you have with your health care provider. Document Released: 06/02/2005 Document Revised: 03/17/2016 Document Reviewed: 03/17/2016 Elsevier Interactive Patient Education  2018 Elsevier Inc.  Lactose Intolerance, Adult Lactose is the natural sugar found in milk and milk products, such as cheese and yogurt. Lactose is digested by lactase, an enzyme in your small intestine. Some people do not produce enough lactase to digest lactose. This is called lactose intolerance. Lactose intolerance is different from milk allergy, which is a more serious reaction to the protein in milk. What are the causes? Causes of lactose intolerance may include:  Normal aging. The ability to produce lactase may decline with age, causing lactose intolerance over time.  Being born without the ability to make lactase.  Digestive diseases such as gastroenteritis or inflammatory bowel disease.  Surgery or injuries to your small intestine.  Infection in your intestines.  Certain antibiotic medicines and cancer treatments.  What are the signs or symptoms? Lactose intolerance can cause uncomfortable symptoms. These are likely to occur within 30 minutes to 2 hours after eating or drinking foods containing lactose. Symptoms of lactose intolerance may include:  Nausea.  Diarrhea.  Abdominal cramps or pain.  Bloating.  Gas.  How is this diagnosed? There are several tests your health care provider can do to diagnose lactose intolerance. These tests include a hydrogen breath test and stool acidity test. How is this treated? No treatment can improve your body's ability to produce lactase. However, your symptoms  can be controlled by limiting or avoiding milk products and other sources of lactose and adjusting your diet. Lactose-free milk is often tolerated. Lactose digestion may also be improved by adding lactase drops to regular milk or by taking lactase tablets when dairy products are consumed. Tolerance to lactose is individual. Some people may be able to eat or drink small amounts of products with lactose, while other may need to avoid lactose entirely. Talk to your health care provider about what is best for you. Follow these instructions at home:  Limit or avoidfoods, beverages, and medicines containing lactose as directed by your health care provider.  Read food and medicine labels carefully to avoid products containing lactose, milk solids, casein, or whey.  If you eliminate dairy products, replace the protein, calcium, vitamin D, and other nutrients they contain through other foods. A registered dietitian or your health care provider can help you adjust your diet.  Choose a milk substitute that is fortified with calcium and vitamin D. Be aware that soy milk contains high quality protein, while milks made from nuts or grains contain very little protein.  Use lactase drops or tablets if directed by your health care provider. Contact a health care provider if: You have no relief from your symptoms after eliminating milk products and other sources of lactose. This information is not intended to replace advice given to you by your health care provider. Make sure you discuss any questions you have with your health care provider. Document Released: 06/02/2005 Document Revised: 11/08/2015 Document Reviewed: 09/02/2013 Elsevier Interactive Patient Education  2018 Elsevier Inc.  

## 2018-04-20 NOTE — Assessment & Plan Note (Signed)
Coumadin 

## 2018-04-20 NOTE — Assessment & Plan Note (Signed)
Try Welchol It should help diarrhea

## 2018-04-20 NOTE — Assessment & Plan Note (Signed)
Chronic  Zolpidem 15 mg- pt has to use higher #  x years  Potential benefits of a long term benzodiazepines  use as well as potential risks  and complications were explained to the patient and were aknowledged.

## 2018-04-20 NOTE — Addendum Note (Signed)
Addended by: Isaiah Serge D on: 04/20/2018 02:03 PM   Modules accepted: Orders

## 2018-04-20 NOTE — Progress Notes (Signed)
Subjective:  Patient ID: Tracy Peters, female    DOB: 12/16/1933  Age: 84 y.o. MRN: [id number]  CC: No chief complaint on file.   HPI CHOUA IKNER presents for chronic diarrhea up to 18/day watery stools and unexpected. Lomotil, Imodium did not work C/o UTI C/o vaginal burning, cloudy urine  Outpatient Medications Prior to Visit  Medication Sig Dispense Refill  . divalproex (DEPAKOTE) 250 MG DR tablet 0.5 tab bid 90 tablet 3  . docusate sodium (COLACE) 100 MG capsule Take 1 capsule (100 mg total) by mouth daily as needed for mild constipation.    Marland Kitchen EPINEPHrine 0.3 mg/0.3 mL IJ SOAJ injection As dirrected 1 Device 3  . ferrous sulfate 325 (65 FE) MG tablet Take 1 tablet (325 mg total) by mouth 3 (three) times daily after meals. (Patient taking differently: Take 325 mg by mouth every other day. )  3  . fish oil-omega-3 fatty acids 1000 MG capsule Take 2 g by mouth daily.     . fluticasone (FLONASE) 50 MCG/ACT nasal spray Place 2 sprays into both nostrils daily. (Patient taking differently: Place 2 sprays into both nostrils daily as needed for allergies or rhinitis. ) 48 g 3  . losartan (COZAAR) 100 MG tablet Take 0.5 tablets (50 mg total) by mouth daily. 90 tablet 3  . mupirocin ointment (BACTROBAN) 2 % Apply topically daily. Cleanse wound to right lower leg with NS.  Apply mupirocin ointment to wound.  Cover with 4x4 gauze and wrap with kerlix/tape.  Change daily. 22 g 3  . valACYclovir (VALTREX) 1000 MG tablet TAKE 1 TABLET BY MOUTH TWICE DAILY. 180 tablet 1  . warfarin (COUMADIN) 5 MG tablet Take 1 and 1/2 tablets on Sundays, Tuesdays, Thursdays and Saturdays. All other days, take only one (1) tablet. 108 tablet 0  . zolpidem (AMBIEN) 10 MG tablet 1 & 1/2 TABLETS (15MG ) BY MOUTH AT BEDTIME AS NEEDED FOR SLEEP, OK TOREPEAT IN 4HRS 135 tablet 1   No facility-administered medications prior to visit.     ROS: Review of Systems  Constitutional: Negative for activity change, appetite  change, chills, fatigue and unexpected weight change.  HENT: Negative for congestion, mouth sores and sinus pressure.   Eyes: Negative for visual disturbance.  Respiratory: Negative for cough and chest tightness.   Gastrointestinal: Positive for diarrhea. Negative for abdominal pain and nausea.  Genitourinary: Positive for dysuria and frequency. Negative for difficulty urinating and vaginal pain.  Musculoskeletal: Negative for back pain and gait problem.  Skin: Negative for pallor and rash.  Neurological: Negative for dizziness, tremors, weakness, numbness and headaches.  Psychiatric/Behavioral: Negative for confusion, sleep disturbance and suicidal ideas.    Objective:  BP (!) 152/86 (BP Location: Left Arm, Patient Position: Sitting, Cuff Size: Normal)   Pulse 92   Temp 98 F (36.7 C) (Oral)   Ht 5\' 4"  (1.626 m)   Wt 131 lb (59.4 kg)   SpO2 99%   BMI 22.49 kg/m   BP Readings from Last 3 Encounters:  04/20/18 (!) 152/86  03/05/18 (!) 142/88  01/11/18 (!) 172/94    Wt Readings from Last 3 Encounters:  04/20/18 131 lb (59.4 kg)  03/05/18 128 lb (58.1 kg)  01/11/18 127 lb (57.6 kg)    Physical Exam  Constitutional: She appears well-developed. No distress.  HENT:  Head: Normocephalic.  Right Ear: External ear normal.  Left Ear: External ear normal.  Nose: Nose normal.  Mouth/Throat: Oropharynx is clear and moist.  Eyes:  Pupils are equal, round, and reactive to light. Conjunctivae are normal. Right eye exhibits no discharge. Left eye exhibits no discharge.  Neck: Normal range of motion. Neck supple. No JVD present. No tracheal deviation present. No thyromegaly present.  Cardiovascular: Normal rate, regular rhythm and normal heart sounds.  Pulmonary/Chest: No stridor. No respiratory distress. She has no wheezes.  Abdominal: Soft. Bowel sounds are normal. She exhibits no distension and no mass. There is no tenderness. There is no rebound and no guarding.  Musculoskeletal: She  exhibits no edema or tenderness.  Lymphadenopathy:    She has no cervical adenopathy.  Neurological: She displays normal reflexes. No cranial nerve deficit. She exhibits normal muscle tone. Coordination normal.  Skin: No rash noted. No erythema.  Psychiatric: She has a normal mood and affect. Her behavior is normal. Judgment and thought content normal.  NAD Ataxic a little  Lab Results  Component Value Date   WBC 6.4 12/16/2017   HGB 12.7 12/16/2017   HCT 36.3 12/16/2017   PLT 299.0 12/16/2017   GLUCOSE 72 03/12/2018   CHOL 196 01/03/2016   TRIG 85.0 01/03/2016   HDL 63.10 01/03/2016   LDLDIRECT 129.8 05/31/2012   LDLCALC 116 (H) 01/03/2016   ALT 21 01/11/2018   AST 23 01/11/2018   NA 129 (L) 03/12/2018   K 4.3 03/12/2018   CL 93 (L) 03/12/2018   CREATININE 0.86 03/12/2018   BUN 20 03/12/2018   CO2 29 03/12/2018   TSH 2.749 10/20/2017   INR 3.0 04/19/2018    Vas US Carotid  Result Date: 01/08/2018 Carotid Arterial Duplex Study Indications:  Office notes on 01/04/18, physician noted bilateral carotid bruits               on exam. Patient denies any cerebrovascular symptoms. Risk Factors: Hypertension. Performing Technologist: Wilkie Aye RVT  Examination Guidelines: A complete evaluation includes B-mode imaging, spectral Doppler, color Doppler, and power Doppler as needed of all accessible portions of each vessel. Bilateral testing is considered an integral part of a complete examination. Limited examinations for reoccurring indications may be performed as noted.  Right Carotid Findings: +----------+--------+--------+--------+------------+--------+           PSV cm/sEDV cm/sStenosisDescribe    Comments +----------+--------+--------+--------+------------+--------+ CCA Prox  76      18                                   +----------+--------+--------+--------+------------+--------+ CCA Mid   69      7                                     +----------+--------+--------+--------+------------+--------+ CCA Distal50      6                                    +----------+--------+--------+--------+------------+--------+ ICA Prox  73      8       1-39%   heterogenous         +----------+--------+--------+--------+------------+--------+ ICA Mid   66      13                                   +----------+--------+--------+--------+------------+--------+ ICA Distal81  14                                   +----------+--------+--------+--------+------------+--------+ ECA       95      1               heterogenous         +----------+--------+--------+--------+------------+--------+ +----------+--------+-------+----------------+-------------------+           PSV cm/sEDV cmsDescribe        Arm Pressure (mmHG) +----------+--------+-------+----------------+-------------------+ Subclavian110            Multiphasic, [id number]                 +----------+--------+-------+----------------+-------------------+ +---------+--------+--+--------+--+---------+ VertebralPSV cm/s53EDV cm/s11Antegrade +---------+--------+--+--------+--+---------+  Left Carotid Findings: +----------+--------+--------+--------+------------+--------+           PSV cm/sEDV cm/sStenosisDescribe    Comments +----------+--------+--------+--------+------------+--------+ CCA Prox  73      1                                    +----------+--------+--------+--------+------------+--------+ CCA Mid   90      1                                    +----------+--------+--------+--------+------------+--------+ CCA Distal67      7                                    +----------+--------+--------+--------+------------+--------+ ICA Prox  44      9       1-39%   heterogenous         +----------+--------+--------+--------+------------+--------+ ICA Mid   63      13                                    +----------+--------+--------+--------+------------+--------+ ICA Distal50      10                                   +----------+--------+--------+--------+------------+--------+ ECA       71      1               heterogenous         +----------+--------+--------+--------+------------+--------+ +----------+--------+--------+----------------+-------------------+ SubclavianPSV cm/sEDV cm/sDescribe        Arm Pressure (mmHG) +----------+--------+--------+----------------+-------------------+           107             Multiphasic, [id number]                 +----------+--------+--------+----------------+-------------------+ +---------+--------+--+--------+-+---------+ VertebralPSV cm/s43EDV cm/s7Antegrade +---------+--------+--+--------+-+---------+  Final Interpretation: Right Carotid: Velocities in the right ICA are consistent with a 1-39% stenosis. Left Carotid: Velocities in the left ICA are consistent with a 1-39% stenosis. Vertebrals:  Bilateral vertebral arteries demonstrate antegrade flow. Subclavians: Normal flow hemodynamics were seen in bilateral subclavian              arteries. *See table(s) above for measurements and observations.  Electronically signed by Carlyle Dolly MD on 01/08/2018 at 2:28:10 PM.    Final  Assessment & Plan:   There are no diagnoses linked to this encounter.   No orders of the defined types were placed in this encounter.    Follow-up: No follow-ups on file.  [name], MD

## 2018-04-20 NOTE — Assessment & Plan Note (Signed)
labs

## 2018-04-20 NOTE — Assessment & Plan Note (Addendum)
Try Welchol Labs chronic diarrhea up to 18/day watery stools and unexpected. Lomotil, Imodium did not work

## 2018-04-21 ENCOUNTER — Other Ambulatory Visit: Payer: 59

## 2018-04-21 ENCOUNTER — Other Ambulatory Visit: Payer: Self-pay | Admitting: Internal Medicine

## 2018-04-21 DIAGNOSIS — M7061 Trochanteric bursitis, right hip: Secondary | ICD-10-CM | POA: Diagnosis not present

## 2018-04-21 DIAGNOSIS — Z96641 Presence of right artificial hip joint: Secondary | ICD-10-CM | POA: Diagnosis not present

## 2018-04-21 DIAGNOSIS — R197 Diarrhea, unspecified: Secondary | ICD-10-CM

## 2018-04-21 DIAGNOSIS — M25551 Pain in right hip: Secondary | ICD-10-CM | POA: Diagnosis not present

## 2018-04-21 MED ORDER — SACCHAROMYCES BOULARDII 250 MG PO CAPS: 250.0000 mg | ORAL_CAPSULE | Freq: Two times a day (BID) | ORAL | 1 refills | Status: DC

## 2018-04-21 MED ORDER — NITROFURANTOIN MONOHYD MACRO 100 MG PO CAPS: 100.0000 mg | ORAL_CAPSULE | Freq: Two times a day (BID) | ORAL | 0 refills | Status: DC

## 2018-04-22 LAB — ALLERGEN FOOD PROFILE SPECIFIC IGE
Allergen Apple, IgE: <0.1 kU/L
Allergen Corn, IgE: <0.1 kU/L
Allergen Tomato, IgE: <0.1 kU/L
Codfish IgE: <0.1 kU/L
IgE (Immunoglobulin E), Serum: 23 IU/mL (ref 6–495)
Orange: <0.1 kU/L
Peanut IgE: <0.1 kU/L
Shrimp IgE: <0.1 kU/L
Soybean IgE: <0.1 kU/L
Tuna: <0.1 kU/L
Wheat IgE: <0.1 kU/L

## 2018-04-22 LAB — GIARDIA/CRYPTOSPORIDIUM (EIA)
MICRO NUMBER:: 91336680
MICRO NUMBER:: 91336681
RESULT: NOT DETECTED
RESULT: NOT DETECTED
SPECIMEN QUALITY: ADEQUATE
SPECIMEN QUALITY:: ADEQUATE

## 2018-04-22 LAB — URINE CULTURE
MICRO NUMBER: 91330498
SPECIMEN QUALITY: ADEQUATE

## 2018-04-23 LAB — SPECIMEN STATUS REPORT

## 2018-04-23 LAB — CLOSTRIDIUM DIFFICILE EIA: C DIFFICILE TOXINS A+ B, EIA: NEGATIVE

## 2018-04-26 ENCOUNTER — Other Ambulatory Visit: Payer: Self-pay | Admitting: Internal Medicine

## 2018-05-10 ENCOUNTER — Ambulatory Visit (INDEPENDENT_AMBULATORY_CARE_PROVIDER_SITE_OTHER): Payer: 59 | Admitting: Pharmacist

## 2018-05-10 DIAGNOSIS — D6859 Other primary thrombophilia: Secondary | ICD-10-CM

## 2018-05-10 DIAGNOSIS — Z7901 Long term (current) use of anticoagulants: Secondary | ICD-10-CM

## 2018-05-10 DIAGNOSIS — Z86718 Personal history of other venous thrombosis and embolism: Secondary | ICD-10-CM | POA: Diagnosis not present

## 2018-05-10 DIAGNOSIS — I742 Embolism and thrombosis of arteries of the upper extremities: Secondary | ICD-10-CM | POA: Diagnosis not present

## 2018-05-10 DIAGNOSIS — Z5181 Encounter for therapeutic drug level monitoring: Secondary | ICD-10-CM

## 2018-05-10 LAB — POCT INR: INR: 1.4 — AB (ref 2.0–3.0)

## 2018-05-10 NOTE — Patient Instructions (Signed)
Patient instructed to take medications as defined in the Anti-coagulation Track section of this encounter.  Patient instructed to take today's dose.  Patient instructed to take  1 & 1/2 tablets of your 5mg  peach-colored warfarin tablets by mouth, once-daily at [facility] on Sundays,Tuesdays, Thursdays and Saturdays. All other days--take ONLY one (1) of your 5mg  peach-colored warfarin tablets.  Patient verbalized understanding of these instructions.

## 2018-05-10 NOTE — Progress Notes (Signed)
Anticoagulation Management Tracy Peters is a 84 y.o. female who reports to the clinic for monitoring of warfarin treatment.    Indication: Chronic anticoagulation, DVT-Hx of, Thrombosis of right radial artery, primary hypercoagulable state, long term current use of anticoagulant.   Duration: indefinite Supervising physician: Murriel Hopper  Anticoagulation Clinic Visit History: Patient does not report signs/symptoms of bleeding or thromboembolism  Other recent changes: No diet, medications, lifestyle changes.  Anticoagulation Episode Summary    Current INR goal:   2.0-3.0  TTR:   70.1 % (6.8 y)  Next INR check:   05/31/2018  INR from last check:   1.4! (05/10/2018)  Weekly max warfarin dose:     Target end date:   Indefinite  INR check location:   Anticoagulation Clinic  Preferred lab:     Send INR reminders to:   [facility]   Indications   Chronic anticoagulation [Z79.01] DVT HX OF [Z86.718] 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HCC) [I74.2] Primary hypercoagulable state ([place]) [D68.59] [D68.59] Long term (current) use of anticoagulants [Z79.01] [Z79.01]       Comments:         Anticoagulation Care Providers    Provider Role Specialty Phone number   Annia Belt, MD Referring Oncology [phone]      Allergies  Allergen Reactions  . Bee Venom Anaphylaxis and Other (See Comments)    Yellow jackets, wasps as well  . Ivp Dye [Iodinated Diagnostic Agents] Anaphylaxis  . Shellfish Allergy Anaphylaxis  . Temazepam Other (See Comments)    Severe Hallucinations and Paranoia  . Amlodipine Besylate Swelling  . Aspirin Other (See Comments)    REACTION: unspecified  . Atorvastatin Diarrhea  . Cholestyramine Other (See Comments)    REACTION: mouth irritation  . Codeine Phosphate Nausea And Vomiting  . Demerol [Meperidine] Other (See Comments)    Severe Hallucination!!!!  . Diphenhydramine Hcl Other (See Comments)    hyperactive  . Doxycycline Nausea Only   . Gentamicin Sulfate Other (See Comments)    REACTION: unspecified  . Keppra [Levetiracetam]     Made her too groggy/ sleepy  . Meclizine Hcl Other (See Comments)    REACTION: more dizziness on it  . Meperidine Hcl Other (See Comments)    Unknown  . Metronidazole Other (See Comments)    REACTION: bad taste  . Moxifloxacin Other (See Comments)    REACTION: insomnia Able to use Cipro  . Sulfamethoxazole Other (See Comments)    Kidney problems  . Penicillins Swelling, Rash and Other (See Comments)    Has patient had a PCN reaction causing immediate rash, facial/tongue/throat swelling, SOB or lightheadedness with hypotension: Yes Has patient had a PCN reaction causing severe rash involving mucus membranes or skin necrosis: No Has patient had a PCN reaction that required hospitalization: No Has patient had a PCN reaction occurring within the last 10 years: No If all of the above answers are "NO", then may proceed with Cephalosporin use.    Prior to Admission medications   Medication Sig Start Date End Date Taking? Authorizing Provider  colesevelam (WELCHOL) 625 MG tablet Take 2 tablets (1,250 mg total) by mouth 2 (two) times daily with a meal. 04/20/18 04/20/19  Plotnikov, Evie Lacks, MD  divalproex (DEPAKOTE) 250 MG DR tablet 0.5 tab bid 03/05/18   Plotnikov, Evie Lacks, MD  docusate sodium (COLACE) 100 MG capsule Take 1 capsule (100 mg total) by mouth daily as needed for mild constipation. 10/22/17   Mendel Corning, MD  EPINEPHrine  0.3 mg/0.3 mL IJ SOAJ injection As dirrected 01/27/17   Plotnikov, Evie Lacks, MD  ferrous sulfate 325 (65 FE) MG tablet Take 1 tablet (325 mg total) by mouth 3 (three) times daily after meals. Patient taking differently: Take 325 mg by mouth every other day.  06/18/17   Danae Orleans, PA-C  fish oil-omega-3 fatty acids 1000 MG capsule Take 2 g by mouth daily.     [provider]  fluticasone (FLONASE) 50 MCG/ACT nasal spray Place 2 sprays into both  nostrils daily. Patient taking differently: Place 2 sprays into both nostrils daily as needed for allergies or rhinitis.  07/29/17   Plotnikov, Evie Lacks, MD  losartan (COZAAR) 100 MG tablet Take 0.5 tablets (50 mg total) by mouth daily. 01/12/18   Plotnikov, Evie Lacks, MD  mupirocin ointment (BACTROBAN) 2 % Apply topically daily. Cleanse wound to right lower leg with NS.  Apply mupirocin ointment to wound.  Cover with 4x4 gauze and wrap with kerlix/tape.  Change daily. 10/23/17   Rai, Ripudeep K, MD  nitrofurantoin, macrocrystal-monohydrate, (MACROBID) 100 MG capsule TAKE 1 CAPSULE TWICE DAILY FOR 7 DAYS. 04/27/18   Plotnikov, Evie Lacks, MD  saccharomyces boulardii (FLORASTOR) 250 MG capsule Take 1 capsule (250 mg total) by mouth 2 (two) times daily. 04/21/18   Plotnikov, Evie Lacks, MD  triamcinolone ointment (KENALOG) 0.1 % Apply 1 application topically 3 (three) times daily. 04/20/18   Plotnikov, Evie Lacks, MD  valACYclovir (VALTREX) 1000 MG tablet TAKE 1 TABLET BY MOUTH TWICE DAILY. 02/07/18   Plotnikov, Evie Lacks, MD  warfarin (COUMADIN) 5 MG tablet Take 1 and 1/2 tablets on Sundays, Tuesdays, Thursdays and Saturdays. All other days, take only one (1) tablet. 04/06/18   Pennie Banter, RPH-CPP  zolpidem (AMBIEN) 10 MG tablet 1 & 1/2 TABLETS (15MG ) BY MOUTH AT BEDTIME AS NEEDED FOR SLEEP, OK TOREPEAT IN 4HRS 03/05/18   Plotnikov, Evie Lacks, MD   Past Medical History:  Diagnosis Date  . Adenomatous colon polyp   . Allergic rhinitis    uses FLonase nightly  . Anemia   . Arthritis    joint pain  . Blood transfusion   . Bronchitis    hx of-7-61yrs ago  . Carpal tunnel syndrome    numbness and tingling   . Cataracts, bilateral   . Chronic anticoagulation 03/21/2013  . Congenital absence of one kidney   . Diarrhea    takes Immodium daily as needed or Lomotil   . Diverticulosis   . GERD (gastroesophageal reflux disease)    mild  . Herpes    takes Valtrex daily  . History of colon polyps   .  History of DVT (deep vein thrombosis) 1998   right arm  . History of staph infection   . Hyperlipidemia    Medical MD doesn't think its absolutely necessary  . Hypertension    takes Losartan daily  . IBS (irritable bowel syndrome)    takes Electronics engineer daily  . Insomnia    takes Ambien nightly as needed  . LBP (low back pain)   . Long term (current) use of anticoagulants    Dr. Beryle Beams  . Nocturia   . Osteoporosis   . Raynaud's disease   . 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 Winfield Cunas  . Seizures ([place]) 10/20/2017  . 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 Urinary leakage   . UTI (urinary tract infection)  Social History   Socioeconomic History  . Marital status: Married    Spouse name: Not on file  . Number of children: 2  . Years of education: Not on file  . Highest education level: Not on file  Occupational History  . Occupation: Retired    Fish farm manager: RETIRED  Social Needs  . Financial resource strain: Not on file  . Food insecurity:    Worry: Not on file    Inability: Not on file  . Transportation needs:    Medical: Not on file    Non-medical: Not on file  Tobacco Use  . Smoking status: Never Smoker  . Smokeless tobacco: Never Used  Substance and Sexual Activity  . Alcohol use: No    Alcohol/week: 0.0 standard drinks  . Drug use: No  . Sexual activity: Never  Lifestyle  . Physical activity:    Days per week: Not on file    Minutes per session: Not on file  . Stress: Not on file  Relationships  . Social connections:    Talks on phone: Not on file    Gets together: Not on file    Attends religious service: Not on file    Active member of club or organization: Not on file    Attends meetings of clubs or organizations: Not on file    Relationship status: Not on file  Other Topics Concern  . Not on file  Social History Narrative   Lives home with husband.  Education- graduate level.  Children  2.     Family History  Problem Relation Age of Onset  . Breast cancer Sister   . Kidney disease Sister 51       nephritis  . Parkinsonism Mother   . Heart disease Brother   . Osteoarthritis Other   . Ulcerative colitis Sister   . Kidney disease Sister   . Colon cancer Neg Hx     ASSESSMENT Recent Results: The most recent result is correlated with 40 mg per week: Lab Results  Component Value Date   INR 1.4 (A) 05/10/2018   INR 3.0 04/19/2018   INR 3.0 04/06/2018   PROTIME 39.6 (H) 04/03/2015    Anticoagulation Dosing: Description   Take 1 & 1/2 tablets of your 5mg  peach-colored warfarin tablets by mouth, once-daily at [facility] on Sundays,Tuesdays, Thursdays and Saturdays. All other days--take ONLY one (1) of your 5mg  peach-colored warfarin tablets.   DR Azucena Freed PATIENT (route warfarin notes and list as authorizing provider for LOS & Follow-up, lab orders and warfarin prescriptions), do not list attending physician      INR today: Subtherapeutic  PLAN Weekly dose was increased by 12% to 45 mg per week  Patient Instructions  Patient instructed to take medications as defined in the Anti-coagulation Track section of this encounter.  Patient instructed to take today's dose.  Patient instructed to take  1 & 1/2 tablets of your 5mg  peach-colored warfarin tablets by mouth, once-daily at [facility] on Sundays,Tuesdays, Thursdays and Saturdays. All other days--take ONLY one (1) of your 5mg  peach-colored warfarin tablets.  Patient verbalized understanding of these instructions.     Patient advised to contact clinic or seek medical attention if signs/symptoms of bleeding or thromboembolism occur.  Patient verbalized understanding by repeating back information and was advised to contact me if further medication-related questions arise. Patient was also provided an information handout.  Follow-up Return in 3 weeks (on 05/31/2018) for Follow up INR at 1100.  Pennie Banter, PharmD,  CACP, CPP  15 minutes spent face-to-face with the patient during the encounter. 50% of time spent on education. 50% of time was spent on fingerstick point of care INR sample collection, processing, results determination, dose adjustment and documentation in [email or web address].[email or web address]

## 2018-05-10 NOTE — Progress Notes (Signed)
Reviewed Thanks DrG 

## 2018-05-19 ENCOUNTER — Ambulatory Visit (INDEPENDENT_AMBULATORY_CARE_PROVIDER_SITE_OTHER): Payer: 59 | Admitting: Internal Medicine

## 2018-05-19 ENCOUNTER — Encounter: Payer: Self-pay | Admitting: Internal Medicine

## 2018-05-19 ENCOUNTER — Other Ambulatory Visit (INDEPENDENT_AMBULATORY_CARE_PROVIDER_SITE_OTHER): Payer: 59

## 2018-05-19 DIAGNOSIS — E871 Hypo-osmolality and hyponatremia: Secondary | ICD-10-CM

## 2018-05-19 DIAGNOSIS — R569 Unspecified convulsions: Secondary | ICD-10-CM

## 2018-05-19 DIAGNOSIS — I742 Embolism and thrombosis of arteries of the upper extremities: Secondary | ICD-10-CM

## 2018-05-19 DIAGNOSIS — R945 Abnormal results of liver function studies: Secondary | ICD-10-CM

## 2018-05-19 DIAGNOSIS — R197 Diarrhea, unspecified: Secondary | ICD-10-CM

## 2018-05-19 DIAGNOSIS — Z7901 Long term (current) use of anticoagulants: Secondary | ICD-10-CM

## 2018-05-19 DIAGNOSIS — R7989 Other specified abnormal findings of blood chemistry: Secondary | ICD-10-CM

## 2018-05-19 LAB — CBC WITH DIFFERENTIAL/PLATELET
BASOS PCT: 0.6 % (ref 0.0–3.0)
Basophils Absolute: 0 10*3/uL (ref 0.0–0.1)
Eosinophils Absolute: 0.1 10*3/uL (ref 0.0–0.7)
Eosinophils Relative: 0.9 % (ref 0.0–5.0)
HCT: 36.2 % (ref 36.0–46.0)
Hemoglobin: 12.3 g/dL (ref 12.0–15.0)
Lymphocytes Relative: 35.2 % (ref 12.0–46.0)
Lymphs Abs: 2.5 10*3/uL (ref 0.7–4.0)
MCHC: 33.9 g/dL (ref 30.0–36.0)
MCV: 112.7 fl — ABNORMAL HIGH (ref 78.0–100.0)
Monocytes Absolute: 0.7 10*3/uL (ref 0.1–1.0)
Monocytes Relative: 9.3 % (ref 3.0–12.0)
Neutro Abs: 3.8 10*3/uL (ref 1.4–7.7)
Neutrophils Relative %: 54 % (ref 43.0–77.0)
Platelets: 257 10*3/uL (ref 150.0–400.0)
RBC: 3.21 Mil/uL — ABNORMAL LOW (ref 3.87–5.11)
RDW: 13.3 % (ref 11.5–15.5)
WBC: 7.1 10*3/uL (ref 4.0–10.5)

## 2018-05-19 LAB — BASIC METABOLIC PANEL
BUN: 15 mg/dL (ref 6–23)
CO2: 26 mEq/L (ref 19–32)
Calcium: 9.5 mg/dL (ref 8.4–10.5)
Chloride: 94 mEq/L — ABNORMAL LOW (ref 96–112)
Creatinine, Ser: 0.81 mg/dL (ref 0.40–1.20)
GFR: 71.82 mL/min (ref >60.00)
Glucose, Bld: 90 mg/dL (ref 70–99)
POTASSIUM: 4.6 meq/L (ref 3.5–5.1)
Sodium: 128 mEq/L — ABNORMAL LOW (ref 135–145)

## 2018-05-19 LAB — HEPATIC FUNCTION PANEL
ALT: 15 U/L (ref 0–35)
AST: 19 U/L (ref 0–37)
Albumin: 4.2 g/dL (ref 3.5–5.2)
Alkaline Phosphatase: 46 U/L (ref 39–117)
BILIRUBIN TOTAL: 0.6 mg/dL (ref 0.2–1.2)
Bilirubin, Direct: 0.1 mg/dL (ref 0.0–0.3)
Total Protein: 7.4 g/dL (ref 6.0–8.3)

## 2018-05-19 MED ORDER — DIPHENOXYLATE-ATROPINE 2.5-0.025 MG PO TABS: 1.0000 | ORAL_TABLET | Freq: Four times a day (QID) | ORAL | 2 refills | Status: DC | PRN

## 2018-05-19 NOTE — Assessment & Plan Note (Signed)
Labs

## 2018-05-19 NOTE — Progress Notes (Signed)
Subjective:  Patient ID: Tracy Peters, female    DOB: [birth year]  Age: 84 y.o. MRN: [id number]  CC: No chief complaint on file.   HPI Tracy Peters presents for a diarrhea and urinary sx's f/u. Down to 10 stools a day. Welcol helped a little. Lomotil helped in the past Gluten free diet did not help.  Outpatient Medications Prior to Visit  Medication Sig Dispense Refill  . colesevelam (WELCHOL) 625 MG tablet Take 2 tablets (1,250 mg total) by mouth 2 (two) times daily with a meal. 120 tablet 11  . divalproex (DEPAKOTE) 250 MG DR tablet 0.5 tab bid 90 tablet 3  . docusate sodium (COLACE) 100 MG capsule Take 1 capsule (100 mg total) by mouth daily as needed for mild constipation.    Marland Kitchen EPINEPHrine 0.3 mg/0.3 mL IJ SOAJ injection As dirrected 1 Device 3  . ferrous sulfate 325 (65 FE) MG tablet Take 1 tablet (325 mg total) by mouth 3 (three) times daily after meals. (Patient taking differently: Take 325 mg by mouth every other day. )  3  . fish oil-omega-3 fatty acids 1000 MG capsule Take 2 g by mouth daily.     . fluticasone (FLONASE) 50 MCG/ACT nasal spray Place 2 sprays into both nostrils daily. (Patient taking differently: Place 2 sprays into both nostrils daily as needed for allergies or rhinitis. ) 48 g 3  . losartan (COZAAR) 100 MG tablet Take 0.5 tablets (50 mg total) by mouth daily. 90 tablet 3  . mupirocin ointment (BACTROBAN) 2 % Apply topically daily. Cleanse wound to right lower leg with NS.  Apply mupirocin ointment to wound.  Cover with 4x4 gauze and wrap with kerlix/tape.  Change daily. 22 g 3  . nitrofurantoin, macrocrystal-monohydrate, (MACROBID) 100 MG capsule TAKE 1 CAPSULE TWICE DAILY FOR 7 DAYS. 14 capsule 0  . saccharomyces boulardii (FLORASTOR) 250 MG capsule Take 1 capsule (250 mg total) by mouth 2 (two) times daily. 60 capsule 1  . triamcinolone ointment (KENALOG) 0.1 % Apply 1 application topically 3 (three) times daily. 80 g 1  . valACYclovir (VALTREX) 1000 MG  tablet TAKE 1 TABLET BY MOUTH TWICE DAILY. 180 tablet 1  . warfarin (COUMADIN) 5 MG tablet Take 1 and 1/2 tablets on Sundays, Tuesdays, Thursdays and Saturdays. All other days, take only one (1) tablet. 108 tablet 0  . zolpidem (AMBIEN) 10 MG tablet 1 & 1/2 TABLETS (15MG ) BY MOUTH AT BEDTIME AS NEEDED FOR SLEEP, OK TOREPEAT IN 4HRS 135 tablet 1   No facility-administered medications prior to visit.     ROS: Review of Systems  Constitutional: Negative for activity change, appetite change, chills, fatigue and unexpected weight change.  HENT: Negative for congestion, mouth sores and sinus pressure.   Eyes: Negative for visual disturbance.  Respiratory: Negative for cough and chest tightness.   Gastrointestinal: Positive for diarrhea. Negative for abdominal distention, abdominal pain, blood in stool and nausea.  Genitourinary: Negative for difficulty urinating, frequency and vaginal pain.  Musculoskeletal: Positive for arthralgias. Negative for back pain and gait problem.  Skin: Negative for pallor and rash.  Neurological: Negative for dizziness, tremors, weakness, numbness and headaches.  Psychiatric/Behavioral: Negative for confusion and sleep disturbance.    Objective:  BP (!) 152/84 (BP Location: Left Arm, Patient Position: Sitting, Cuff Size: Normal)   Pulse 83   Temp 98.1 F (36.7 C) (Oral)   Ht 5\' 4"  (1.626 m)   Wt 133 lb (60.3 kg)   SpO2 98%  BMI 22.83 kg/m   BP Readings from Last 3 Encounters:  05/19/18 (!) 152/84  04/20/18 (!) 152/86  03/05/18 (!) 142/88    Wt Readings from Last 3 Encounters:  05/19/18 133 lb (60.3 kg)  04/20/18 131 lb (59.4 kg)  03/05/18 128 lb (58.1 kg)    Physical Exam  Constitutional: She appears well-developed. No distress.  HENT:  Head: Normocephalic.  Right Ear: External ear normal.  Left Ear: External ear normal.  Nose: Nose normal.  Mouth/Throat: Oropharynx is clear and moist.  Eyes: Pupils are equal, round, and reactive to light.  Conjunctivae are normal. Right eye exhibits no discharge. Left eye exhibits no discharge.  Neck: Normal range of motion. Neck supple. No JVD present. No tracheal deviation present. No thyromegaly present.  Cardiovascular: Normal rate, regular rhythm and normal heart sounds.  Pulmonary/Chest: No stridor. No respiratory distress. She has no wheezes.  Abdominal: Soft. Bowel sounds are normal. She exhibits no distension and no mass. There is no tenderness. There is no rebound and no guarding.  Musculoskeletal: She exhibits tenderness. She exhibits no edema.  Lymphadenopathy:    She has no cervical adenopathy.  Neurological: She displays normal reflexes. No cranial nerve deficit. She exhibits normal muscle tone. Coordination abnormal.  Skin: No rash noted. No erythema.  Psychiatric: She has a normal mood and affect. Her behavior is normal. Judgment and thought content normal.  cane, ataxic  Lab Results  Component Value Date   WBC 6.4 12/16/2017   HGB 12.7 12/16/2017   HCT 36.3 12/16/2017   PLT 299.0 12/16/2017   GLUCOSE 91 04/20/2018   CHOL 196 01/03/2016   TRIG 85.0 01/03/2016   HDL 63.10 01/03/2016   LDLDIRECT 129.8 05/31/2012   LDLCALC 116 (H) 01/03/2016   ALT 15 04/20/2018   AST 20 04/20/2018   NA 131 (L) 04/20/2018   K 4.8 04/20/2018   CL 97 04/20/2018   CREATININE 0.90 04/20/2018   BUN 18 04/20/2018   CO2 27 04/20/2018   TSH 2.52 04/20/2018   INR 1.4 (A) 05/10/2018    Vas US Carotid  Result Date: 01/08/2018 Carotid Arterial Duplex Study Indications:  Office notes on 01/04/18, physician noted bilateral carotid bruits               on exam. Patient denies any cerebrovascular symptoms. Risk Factors: Hypertension. Performing Technologist: Wilkie Aye RVT  Examination Guidelines: A complete evaluation includes B-mode imaging, spectral Doppler, color Doppler, and power Doppler as needed of all accessible portions of each vessel. Bilateral testing is considered an integral part of a  complete examination. Limited examinations for reoccurring indications may be performed as noted.  Right Carotid Findings: +----------+--------+--------+--------+------------+--------+           PSV cm/sEDV cm/sStenosisDescribe    Comments +----------+--------+--------+--------+------------+--------+ CCA Prox  76      18                                   +----------+--------+--------+--------+------------+--------+ CCA Mid   69      7                                    +----------+--------+--------+--------+------------+--------+ CCA Distal50      6                                    +----------+--------+--------+--------+------------+--------+  ICA Prox  73      8       1-39%   heterogenous         +----------+--------+--------+--------+------------+--------+ ICA Mid   66      13                                   +----------+--------+--------+--------+------------+--------+ ICA Distal81      14                                   +----------+--------+--------+--------+------------+--------+ ECA       95      1               heterogenous         +----------+--------+--------+--------+------------+--------+ +----------+--------+-------+----------------+-------------------+           PSV cm/sEDV cmsDescribe        Arm Pressure (mmHG) +----------+--------+-------+----------------+-------------------+ Subclavian110            Multiphasic, [id number]                 +----------+--------+-------+----------------+-------------------+ +---------+--------+--+--------+--+---------+ VertebralPSV cm/s53EDV cm/s11Antegrade +---------+--------+--+--------+--+---------+  Left Carotid Findings: +----------+--------+--------+--------+------------+--------+           PSV cm/sEDV cm/sStenosisDescribe    Comments +----------+--------+--------+--------+------------+--------+ CCA Prox  73      1                                     +----------+--------+--------+--------+------------+--------+ CCA Mid   90      1                                    +----------+--------+--------+--------+------------+--------+ CCA Distal67      7                                    +----------+--------+--------+--------+------------+--------+ ICA Prox  44      9       1-39%   heterogenous         +----------+--------+--------+--------+------------+--------+ ICA Mid   63      13                                   +----------+--------+--------+--------+------------+--------+ ICA Distal50      10                                   +----------+--------+--------+--------+------------+--------+ ECA       71      1               heterogenous         +----------+--------+--------+--------+------------+--------+ +----------+--------+--------+----------------+-------------------+ SubclavianPSV cm/sEDV cm/sDescribe        Arm Pressure (mmHG) +----------+--------+--------+----------------+-------------------+           107             Multiphasic, [id number]                 +----------+--------+--------+----------------+-------------------+ +---------+--------+--+--------+-+---------+ VertebralPSV cm/s43EDV cm/s7Antegrade +---------+--------+--+--------+-+---------+  Final Interpretation: Right  Carotid: Velocities in the right ICA are consistent with a 1-39% stenosis. Left Carotid: Velocities in the left ICA are consistent with a 1-39% stenosis. Vertebrals:  Bilateral vertebral arteries demonstrate antegrade flow. Subclavians: Normal flow hemodynamics were seen in bilateral subclavian              arteries. *See table(s) above for measurements and observations.  Electronically signed by Carlyle Dolly MD on 01/08/2018 at 2:28:10 PM.    Final     Assessment & Plan:   There are no diagnoses linked to this encounter.   No orders of the defined types were placed in this encounter.    Follow-up: No follow-ups on  file.  [name], MD

## 2018-05-19 NOTE — Assessment & Plan Note (Signed)
Coumdin

## 2018-05-19 NOTE — Assessment & Plan Note (Signed)
Down to 10 stools a day. Welcol helped a little - will continue. Gluten free diet did not help Lomotil prn

## 2018-05-19 NOTE — Assessment & Plan Note (Signed)
LFTs 

## 2018-05-19 NOTE — Assessment & Plan Note (Signed)
No relapse 

## 2018-05-19 NOTE — Patient Instructions (Signed)

## 2018-05-20 ENCOUNTER — Other Ambulatory Visit: Payer: Self-pay | Admitting: Oncology

## 2018-05-20 ENCOUNTER — Other Ambulatory Visit: Payer: Self-pay | Admitting: Internal Medicine

## 2018-05-20 DIAGNOSIS — R531 Weakness: Secondary | ICD-10-CM

## 2018-05-20 DIAGNOSIS — I742 Embolism and thrombosis of arteries of the upper extremities: Secondary | ICD-10-CM

## 2018-05-20 DIAGNOSIS — Z7901 Long term (current) use of anticoagulants: Secondary | ICD-10-CM

## 2018-05-31 ENCOUNTER — Encounter: Payer: Self-pay | Admitting: Pharmacist

## 2018-05-31 ENCOUNTER — Ambulatory Visit (INDEPENDENT_AMBULATORY_CARE_PROVIDER_SITE_OTHER): Payer: 59 | Admitting: Pharmacist

## 2018-05-31 DIAGNOSIS — Z86718 Personal history of other venous thrombosis and embolism: Secondary | ICD-10-CM | POA: Diagnosis not present

## 2018-05-31 DIAGNOSIS — Z5181 Encounter for therapeutic drug level monitoring: Secondary | ICD-10-CM

## 2018-05-31 DIAGNOSIS — Z7901 Long term (current) use of anticoagulants: Secondary | ICD-10-CM | POA: Diagnosis not present

## 2018-05-31 DIAGNOSIS — D6859 Other primary thrombophilia: Secondary | ICD-10-CM

## 2018-05-31 DIAGNOSIS — I742 Embolism and thrombosis of arteries of the upper extremities: Secondary | ICD-10-CM

## 2018-05-31 LAB — POCT INR: INR: 2.5 (ref 2.0–3.0)

## 2018-05-31 NOTE — Progress Notes (Signed)
Anticoagulation Management Tracy Peters is a 84 y.o. female who reports to the clinic for monitoring of warfarin treatment.    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    Duration: indefinite Supervising physician: Murriel Hopper  Anticoagulation Clinic Visit History: Patient does not report signs/symptoms of bleeding or thromboembolism  Other recent changes: No diet, medications, lifestyle changes endorsed.  Anticoagulation Episode Summary    Current INR goal:   2.0-3.0  TTR:   69.9 % (6.9 y)  Next INR check:   07/12/2018  INR from last check:   2.5 (05/31/2018)  Weekly max warfarin dose:     Target end date:   Indefinite  INR check location:   Anticoagulation Clinic  Preferred lab:     Send INR reminders to:   [facility]   Indications   Chronic anticoagulation [Z79.01] DVT HX OF [Z86.718] 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[facility]) [I74.2] Primary hypercoagulable state ([place]) [D68.59] [D68.59] Long term (current) use of anticoagulants [Z79.01] [Z79.01]       Comments:         Anticoagulation Care Providers    Provider Role Specialty Phone number   Annia Belt, MD Referring Oncology [phone]      Allergies  Allergen Reactions  . Bee Venom Anaphylaxis and Other (See Comments)    Yellow jackets, wasps as well  . Ivp Dye [Iodinated Diagnostic Agents] Anaphylaxis  . Shellfish Allergy Anaphylaxis  . Temazepam Other (See Comments)    Severe Hallucinations and Paranoia  . Amlodipine Besylate Swelling  . Aspirin Other (See Comments)    REACTION: unspecified  . Atorvastatin Diarrhea  . Cholestyramine Other (See Comments)    REACTION: mouth irritation  . Codeine Phosphate Nausea And Vomiting  . Demerol [Meperidine] Other (See Comments)    Severe Hallucination!!!!  . Diphenhydramine Hcl Other (See Comments)    hyperactive  . Doxycycline  Nausea Only  . Gentamicin Sulfate Other (See Comments)    REACTION: unspecified  . Keppra [Levetiracetam]     Made her too groggy/ sleepy  . Meclizine Hcl Other (See Comments)    REACTION: more dizziness on it  . Meperidine Hcl Other (See Comments)    Unknown  . Metronidazole Other (See Comments)    REACTION: bad taste  . Moxifloxacin Other (See Comments)    REACTION: insomnia Able to use Cipro  . Sulfamethoxazole Other (See Comments)    Kidney problems  . Penicillins Swelling, Rash and Other (See Comments)    Has patient had a PCN reaction causing immediate rash, facial/tongue/throat swelling, SOB or lightheadedness with hypotension: Yes Has patient had a PCN reaction causing severe rash involving mucus membranes or skin necrosis: No Has patient had a PCN reaction that required hospitalization: No Has patient had a PCN reaction occurring within the last 10 years: No If all of the above answers are "NO", then may proceed with Cephalosporin use.    Prior to Admission medications   Medication Sig Start Date End Date Taking? Authorizing Provider  colesevelam (WELCHOL) 625 MG tablet Take 2 tablets (1,250 mg total) by mouth 2 (two) times daily with a meal. 04/20/18 04/20/19 Yes Plotnikov, Evie Lacks, MD  diphenoxylate-atropine (LOMOTIL) 2.5-0.025 MG tablet Take 1-2 tablets by mouth 4 (four) times daily as needed for diarrhea or loose stools. 05/19/18  Yes Plotnikov, Evie Lacks, MD  divalproex (DEPAKOTE) 250 MG DR tablet 0.5 tab bid 03/05/18  Yes Plotnikov, Evie Lacks,  MD  docusate sodium (COLACE) 100 MG capsule Take 1 capsule (100 mg total) by mouth daily as needed for mild constipation. 10/22/17  Yes Rai, Ripudeep K, MD  EPINEPHrine 0.3 mg/0.3 mL IJ SOAJ injection As dirrected 01/27/17  Yes Plotnikov, Evie Lacks, MD  ferrous sulfate 325 (65 FE) MG tablet Take 1 tablet (325 mg total) by mouth 3 (three) times daily after meals. Patient taking differently: Take 325 mg by mouth every other day.  06/18/17   Yes Babish, Rodman Key, PA-C  fish oil-omega-3 fatty acids 1000 MG capsule Take 2 g by mouth daily.    Yes [provider]  fluticasone (FLONASE) 50 MCG/ACT nasal spray Place 2 sprays into both nostrils daily. Patient taking differently: Place 2 sprays into both nostrils daily as needed for allergies or rhinitis.  07/29/17  Yes Plotnikov, Evie Lacks, MD  losartan (COZAAR) 100 MG tablet Take 0.5 tablets (50 mg total) by mouth daily. 01/12/18  Yes Plotnikov, Evie Lacks, MD  mupirocin ointment (BACTROBAN) 2 % Apply topically daily. Cleanse wound to right lower leg with NS.  Apply mupirocin ointment to wound.  Cover with 4x4 gauze and wrap with kerlix/tape.  Change daily. 10/23/17  Yes Rai, Ripudeep K, MD  nitrofurantoin, macrocrystal-monohydrate, (MACROBID) 100 MG capsule TAKE 1 CAPSULE TWICE DAILY FOR 7 DAYS. 04/27/18  Yes Plotnikov, Evie Lacks, MD  saccharomyces boulardii (FLORASTOR) 250 MG capsule Take 1 capsule (250 mg total) by mouth 2 (two) times daily. 04/21/18  Yes Plotnikov, Evie Lacks, MD  triamcinolone ointment (KENALOG) 0.1 % Apply 1 application topically 3 (three) times daily. 04/20/18  Yes Plotnikov, Evie Lacks, MD  valACYclovir (VALTREX) 1000 MG tablet TAKE 1 TABLET BY MOUTH TWICE DAILY. 02/07/18  Yes Plotnikov, Evie Lacks, MD  warfarin (COUMADIN) 5 MG tablet Take 1 and 1/2 tablets on Sundays, Tuesdays, Thursdays and Saturdays. All other days, take only one (1) tablet. 04/06/18  Yes Pennie Banter, RPH-CPP  zolpidem (AMBIEN) 10 MG tablet 1 & 1/2 TABLETS (15MG ) BY MOUTH AT BEDTIME AS NEEDED FOR SLEEP, OK TOREPEAT IN 4HRS 03/05/18  Yes Plotnikov, Evie Lacks, MD   Past Medical History:  Diagnosis Date  . Adenomatous colon polyp   . Allergic rhinitis    uses FLonase nightly  . Anemia   . Arthritis    joint pain  . Blood transfusion   . Bronchitis    hx of-7-18yrs ago  . Carpal tunnel syndrome    numbness and tingling   . Cataracts, bilateral   . Chronic anticoagulation 03/21/2013  .  Congenital absence of one kidney   . Diarrhea    takes Immodium daily as needed or Lomotil   . Diverticulosis   . GERD (gastroesophageal reflux disease)    mild  . Herpes    takes Valtrex daily  . History of colon polyps   . History of DVT (deep vein thrombosis) 1998   right arm  . History of staph infection   . Hyperlipidemia    Medical MD doesn't think its absolutely necessary  . Hypertension    takes Losartan daily  . IBS (irritable bowel syndrome)    takes Electronics engineer daily  . Insomnia    takes Ambien nightly as needed  . LBP (low back pain)   . Long term (current) use of anticoagulants    Dr. Beryle Beams  . Nocturia   . Osteoporosis   . Raynaud's disease   . 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 Winfield Cunas  . Seizures (  [place]) 10/20/2017  . 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 Urinary leakage   . UTI (urinary tract infection)    Social History   Socioeconomic History  . Marital status: Married    Spouse name: Not on file  . Number of children: 2  . Years of education: Not on file  . Highest education level: Not on file  Occupational History  . Occupation: Retired    Fish farm manager: RETIRED  Social Needs  . Financial resource strain: Not on file  . Food insecurity:    Worry: Not on file    Inability: Not on file  . Transportation needs:    Medical: Not on file    Non-medical: Not on file  Tobacco Use  . Smoking status: Never Smoker  . Smokeless tobacco: Never Used  Substance and Sexual Activity  . Alcohol use: No    Alcohol/week: 0.0 standard drinks  . Drug use: No  . Sexual activity: Never  Lifestyle  . Physical activity:    Days per week: Not on file    Minutes per session: Not on file  . Stress: Not on file  Relationships  . Social connections:    Talks on phone: Not on file    Gets together: Not on file    Attends religious service: Not on file    Active member of club or organization: Not on  file    Attends meetings of clubs or organizations: Not on file    Relationship status: Not on file  Other Topics Concern  . Not on file  Social History Narrative   Lives home with husband.  Education- graduate level.  Children 2.     Family History  Problem Relation Age of Onset  . Breast cancer Sister   . Kidney disease Sister 84       nephritis  . Parkinsonism Mother   . Heart disease Brother   . Osteoarthritis Other   . Ulcerative colitis Sister   . Kidney disease Sister   . Colon cancer Neg Hx     ASSESSMENT Recent Results: The most recent result is correlated with 45 mg per week: Lab Results  Component Value Date   INR 2.5 05/31/2018   INR 1.4 (A) 05/10/2018   INR 3.0 04/19/2018   PROTIME 39.6 (H) 04/03/2015    Anticoagulation Dosing: Description   Take 1 & 1/2 tablets of your 5mg  peach-colored warfarin tablets by mouth, once-daily at [facility] on Sundays,Tuesdays, Thursdays and Saturdays. All other days--take ONLY one (1) of your 5mg  peach-colored warfarin tablets.   DR Azucena Freed PATIENT (route warfarin notes and list as authorizing provider for LOS & Follow-up, lab orders and warfarin prescriptions), do not list attending physician      INR today: Therapeutic  PLAN Weekly dose was unchanged.  Patient Instructions  Patient instructed to take medications as defined in the Anti-coagulation Track section of this encounter.  Patient instructed to take today's dose.  Patient instructed to take 1 & 1/2 tablets of your 5mg  peach-colored warfarin tablets by mouth, once-daily at [facility] on Sundays,Tuesdays, Thursdays and Saturdays. All other days--take ONLY one (1) of your 5mg  peach-colored warfarin tablets.  Patient verbalized understanding of these instructions.     Patient advised to contact clinic or seek medical attention if signs/symptoms of bleeding or thromboembolism occur.  Patient verbalized understanding by repeating back information and was advised to contact  me if further medication-related questions arise. Patient  was also provided an information handout.  Follow-up Return in 6 weeks (on 07/12/2018) for Follow up INR at 1130h.  Pennie Banter, PharmD, CPP  15 minutes spent face-to-face with the patient during the encounter. 50% of time spent on education. 50% of time was spent on fingerstick point of care INR sample collection, processing, results determination, and documentation in http://[email or web address].net/.

## 2018-05-31 NOTE — Patient Instructions (Signed)
Patient instructed to take medications as defined in the Anti-coagulation Track section of this encounter.  Patient instructed to take today's dose.  Patient instructed to take 1 & 1/2 tablets of your 5mg  peach-colored warfarin tablets by mouth, once-daily at [facility] on Sundays,Tuesdays, Thursdays and Saturdays. All other days--take ONLY one (1) of your 5mg  peach-colored warfarin tablets.  Patient verbalized understanding of these instructions.

## 2018-06-28 DIAGNOSIS — M810 Age-related osteoporosis without current pathological fracture: Secondary | ICD-10-CM | POA: Insufficient documentation

## 2018-06-28 DIAGNOSIS — A6000 Herpesviral infection of urogenital system, unspecified: Secondary | ICD-10-CM | POA: Insufficient documentation

## 2018-06-28 DIAGNOSIS — N289 Disorder of kidney and ureter, unspecified: Secondary | ICD-10-CM | POA: Insufficient documentation

## 2018-06-28 DIAGNOSIS — K589 Irritable bowel syndrome without diarrhea: Secondary | ICD-10-CM | POA: Insufficient documentation

## 2018-06-28 DIAGNOSIS — N390 Urinary tract infection, site not specified: Secondary | ICD-10-CM | POA: Insufficient documentation

## 2018-06-29 DIAGNOSIS — Z01419 Encounter for gynecological examination (general) (routine) without abnormal findings: Secondary | ICD-10-CM | POA: Diagnosis not present

## 2018-06-29 DIAGNOSIS — Z13820 Encounter for screening for osteoporosis: Secondary | ICD-10-CM | POA: Diagnosis not present

## 2018-06-29 DIAGNOSIS — M810 Age-related osteoporosis without current pathological fracture: Secondary | ICD-10-CM | POA: Diagnosis not present

## 2018-06-29 DIAGNOSIS — Z1231 Encounter for screening mammogram for malignant neoplasm of breast: Secondary | ICD-10-CM | POA: Diagnosis not present

## 2018-06-29 DIAGNOSIS — D649 Anemia, unspecified: Secondary | ICD-10-CM | POA: Insufficient documentation

## 2018-06-29 DIAGNOSIS — Z6824 Body mass index (BMI) 24.0-24.9, adult: Secondary | ICD-10-CM | POA: Diagnosis not present

## 2018-07-01 ENCOUNTER — Other Ambulatory Visit: Payer: Self-pay | Admitting: Obstetrics and Gynecology

## 2018-07-01 DIAGNOSIS — R928 Other abnormal and inconclusive findings on diagnostic imaging of breast: Secondary | ICD-10-CM

## 2018-07-06 ENCOUNTER — Other Ambulatory Visit: Payer: Self-pay | Admitting: Obstetrics and Gynecology

## 2018-07-06 ENCOUNTER — Ambulatory Visit
Admission: RE | Admit: 2018-07-06 | Discharge: 2018-07-06 | Disposition: A | Discharge status: Home / Self Care | Payer: 59 | Source: Ambulatory Visit | Attending: Obstetrics and Gynecology | Admitting: Obstetrics and Gynecology

## 2018-07-06 DIAGNOSIS — R921 Mammographic calcification found on diagnostic imaging of breast: Secondary | ICD-10-CM

## 2018-07-06 DIAGNOSIS — R928 Other abnormal and inconclusive findings on diagnostic imaging of breast: Secondary | ICD-10-CM

## 2018-07-07 ENCOUNTER — Ambulatory Visit (INDEPENDENT_AMBULATORY_CARE_PROVIDER_SITE_OTHER): Payer: 59 | Admitting: Diagnostic Neuroimaging

## 2018-07-07 ENCOUNTER — Encounter: Payer: Self-pay | Admitting: Diagnostic Neuroimaging

## 2018-07-07 VITALS — BP 144/86 | HR 86 | Ht 64.0 in | Wt 133.2 lb

## 2018-07-07 DIAGNOSIS — G40909 Epilepsy, unspecified, not intractable, without status epilepticus: Secondary | ICD-10-CM

## 2018-07-07 MED ORDER — LACOSAMIDE 50 MG PO TABS: 50.0000 mg | ORAL_TABLET | Freq: Two times a day (BID) | ORAL | 5 refills | Status: DC

## 2018-07-07 NOTE — Progress Notes (Signed)
[facility]  PATIENT: Tracy Peters DOB: 09/12/1933  REFERRING CLINICIAN: A Plotnikov HISTORY FROM: patient  REASON FOR VISIT: follow up    HISTORICAL  CHIEF COMPLAINT:  Chief Complaint  Patient presents with  . Follow-up    Rm 6, husband.  . Seizures    doing well.  Taking depakote 1/4 tablet in am. Stated that taking 125mg  po bid caused her drowsiness, tremors and since taking this dose now  SE have disappeared.  She wants to come off this medication.    HISTORY OF PRESENT ILLNESS:   UPDATE (07/07/18, VRP): Since last visit, had some tremor in hands, and then on her own she reduce depakote to 0.25 tabs in AM. Tremor resolved. No seizures. No alleviating or aggravating factors.  PRIOR HPI (05/19/18): 85 year old female here for evaluation of seizures.  History of hypercoagulable state, on chronic warfarin.  10/19/2017 patient was at home and had a seizure.  She had right-sided convulsions, unresponsiveness.  This lasted for 1 minute.  Husband witnessed the seizure and called 911.  Patient started to wake up by the time paramedics arrived.  Patient was taken to the emergency room and had a second seizure in the hospital.  She was treated with IV Keppra initially and this was changed to Depakote due to side effect of sedation.  Seizure work-up was completed and no specific cause was found.  Patient has chronic hyponatremia, chronic insomnia, and had issues with right hip replacement and pain since early 2019.  Since discharge patient has been taking divalproex 250 mg twice a day but this was reduced to 125 mg twice a day due to LFT elevation.  No seizures.   REVIEW OF SYSTEMS: Full 14 system review of systems performed and negative with exception of: anemia back pain cramps leg swelling.    ALLERGIES: Allergies  Allergen Reactions  . Bee Venom Anaphylaxis and Other (See Comments)    Yellow jackets, wasps as well  . Ivp Dye [Iodinated Diagnostic Agents]  Anaphylaxis  . Shellfish Allergy Anaphylaxis  . Temazepam Other (See Comments)    Severe Hallucinations and Paranoia  . Amlodipine Besylate Swelling  . Aspirin Other (See Comments)    REACTION: unspecified  . Atorvastatin Diarrhea  . Cholestyramine Other (See Comments)    REACTION: mouth irritation  . Codeine Phosphate Nausea And Vomiting  . Demerol [Meperidine] Other (See Comments)    Severe Hallucination!!!!  . Diphenhydramine Hcl Other (See Comments)    hyperactive  . Doxycycline Nausea Only  . Gentamicin Sulfate Other (See Comments)    REACTION: unspecified  . Keppra [Levetiracetam]     Made her too groggy/ sleepy  . Meclizine Hcl Other (See Comments)    REACTION: more dizziness on it  . Meperidine Hcl Other (See Comments)    Unknown  . Metronidazole Other (See Comments)    REACTION: bad taste  . Moxifloxacin Other (See Comments)    REACTION: insomnia Able to use Cipro  . Sulfamethoxazole Other (See Comments)    Kidney problems  . Penicillins Swelling, Rash and Other (See Comments)    Has patient had a PCN reaction causing immediate rash, facial/tongue/throat swelling, SOB or lightheadedness with hypotension: Yes Has patient had a PCN reaction causing severe rash involving mucus membranes or skin necrosis: No Has patient had a PCN reaction that required hospitalization: No Has patient had a PCN reaction occurring within the last 10 years: No If all of the above answers are "NO", then may proceed with  Cephalosporin use.     HOME MEDICATIONS: Outpatient Medications Prior to Visit  Medication Sig Dispense Refill  . colesevelam (WELCHOL) 625 MG tablet Take 2 tablets (1,250 mg total) by mouth 2 (two) times daily with a meal. (Patient taking differently: Take 1,250 mg by mouth daily with breakfast. ) 120 tablet 11  . diphenoxylate-atropine (LOMOTIL) 2.5-0.025 MG tablet Take 1-2 tablets by mouth 4 (four) times daily as needed for diarrhea or loose stools. 120 tablet 2  .  divalproex (DEPAKOTE) 250 MG DR tablet 0.5 tab bid (Patient taking differently: 1/4 tablet in AM.) 90 tablet 3  . docusate sodium (COLACE) 100 MG capsule Take 1 capsule (100 mg total) by mouth daily as needed for mild constipation.    Marland Kitchen EPINEPHrine 0.3 mg/0.3 mL IJ SOAJ injection As dirrected 1 Device 3  . ferrous sulfate 325 (65 FE) MG tablet Take 1 tablet (325 mg total) by mouth 3 (three) times daily after meals. (Patient taking differently: Take 325 mg by mouth every other day. )  3  . fluticasone (FLONASE) 50 MCG/ACT nasal spray Place 2 sprays into both nostrils daily. (Patient taking differently: Place 2 sprays into both nostrils daily as needed for allergies or rhinitis. ) 48 g 3  . losartan (COZAAR) 100 MG tablet Take 0.5 tablets (50 mg total) by mouth daily. 90 tablet 3  . mupirocin ointment (BACTROBAN) 2 % Apply topically daily. Cleanse wound to right lower leg with NS.  Apply mupirocin ointment to wound.  Cover with 4x4 gauze and wrap with kerlix/tape.  Change daily. 22 g 3  . Probiotic Product (ALIGN PO) Take by mouth. One cap by mouth Bid.    Marland Kitchen triamcinolone ointment (KENALOG) 0.1 % Apply 1 application topically 3 (three) times daily. 80 g 1  . valACYclovir (VALTREX) 1000 MG tablet TAKE 1 TABLET BY MOUTH TWICE DAILY. 180 tablet 1  . warfarin (COUMADIN) 5 MG tablet Take 1 and 1/2 tablets on Sundays, Tuesdays, Thursdays and Saturdays. All other days, take only one (1) tablet. 108 tablet 0  . zolpidem (AMBIEN) 10 MG tablet 1 & 1/2 TABLETS (15MG ) BY MOUTH AT BEDTIME AS NEEDED FOR SLEEP, OK TOREPEAT IN 4HRS 135 tablet 1  . fish oil-omega-3 fatty acids 1000 MG capsule Take 2 g by mouth daily.     . nitrofurantoin, macrocrystal-monohydrate, (MACROBID) 100 MG capsule TAKE 1 CAPSULE TWICE DAILY FOR 7 DAYS. (Patient not taking: Reported on 07/07/2018) 14 capsule 0  . saccharomyces boulardii (FLORASTOR) 250 MG capsule Take 1 capsule (250 mg total) by mouth 2 (two) times daily. (Patient not taking:  Reported on 07/07/2018) 60 capsule 1   No facility-administered medications prior to visit.     PAST MEDICAL HISTORY: Past Medical History:  Diagnosis Date  . Adenomatous colon polyp   . Allergic rhinitis    uses FLonase nightly  . Anemia   . Arthritis    joint pain  . Blood transfusion   . Bronchitis    hx of-7-13yrs ago  . Carpal tunnel syndrome    numbness and tingling   . Cataracts, bilateral   . Chronic anticoagulation 03/21/2013  . Congenital absence of one kidney   . Diarrhea    takes Immodium daily as needed or Lomotil   . Diverticulosis   . GERD (gastroesophageal reflux disease)    mild  . Herpes    takes Valtrex daily  . History of colon polyps   . History of DVT (deep vein thrombosis) 1998   right  arm  . History of staph infection   . Hyperlipidemia    Medical MD doesn't think its absolutely necessary  . Hypertension    takes Losartan daily  . IBS (irritable bowel syndrome)    takes Electronics engineer daily  . Insomnia    takes Ambien nightly as needed  . LBP (low back pain)   . Long term (current) use of anticoagulants    Dr. Beryle Beams  . Nocturia   . Osteoporosis   . Raynaud's disease   . 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 Winfield Cunas  . Seizures ([place]) 10/20/2017  . 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 Urinary leakage   . UTI (urinary tract infection)     PAST SURGICAL HISTORY: Past Surgical History:  Procedure Laterality Date  .  kidney disease left     . ABDOMINAL HYSTERECTOMY    . ANTERIOR CERVICAL DECOMP/DISCECTOMY FUSION N/A 01/18/2014   Procedure: ANTERIOR CERVICAL DECOMPRESSION/DISCECTOMY FUSION 3 LEVELS  Cervical  four/five, five/six, six/seven anterior cervical decompression with fusion interbody prosthesis with plating and bonegraft;  Surgeon: Newman Pies, MD;  Location: [facility];  Service: Neurosurgery;  Laterality: N/A;  t  . APPENDECTOMY    . BACK SURGERY     2  . BREAST  BIOPSY Left 30+ yrs ago   benign  . BREAST SURGERY     cystectomy-benign  . CARPAL TUNNEL RELEASE Right 06/12/2014   Procedure: CARPAL TUNNEL RELEASE;  Surgeon: Newman Pies, MD;  Location: [facility];  Service: Neurosurgery;  Laterality: Right;  Right Carpal Tunnel Release  . CATARACT EXTRACTION Bilateral 02/15/2015  . CHOLECYSTECTOMY    . COLONOSCOPY    . [place]  . HIP CLOSED REDUCTION Right 08/23/2017   Procedure: CLOSED MANIPULATION HIP;  Surgeon: Nicholes Stairs, MD;  Location: [facility];  Service: Orthopedics;  Laterality: Right;  . I&D of abdomen  1988   couple of wks after gallbladder removed  . KNEE SURGERY     left x 3  . LUMBAR LAMINECTOMY     X 2  . mortons neuromas removed    . OPEN SURGICAL REPAIR OF GLUTEAL TENDON Right 06/15/2017   Procedure: OPEN SURGICAL REPAIR OF GLUTEALmedius TENDON;  Surgeon: Paralee Cancel, MD;  Location: [facility];  Service: Orthopedics;  Laterality: Right;  . SHOULDER SURGERY     Right  . TOTAL HIP ARTHROPLASTY Right 06/15/2017   Procedure: Right total hip arthroplasty, bursectomy, repair of gluteal tendon, posterior approach;  Surgeon: Paralee Cancel, MD;  Location: [facility];  Service: Orthopedics;  Laterality: Right;  90 mins for all procedures together    FAMILY HISTORY: Family History  Problem Relation Age of Onset  . Breast cancer Sister   . Kidney disease Sister 64       nephritis  . Parkinsonism Mother   . Heart disease Brother   . Osteoarthritis Other   . Ulcerative colitis Sister   . Kidney disease Sister   . Colon cancer Neg Hx     SOCIAL HISTORY:  Social History   Socioeconomic History  . Marital status: Married    Spouse name: Not on file  . Number of children: 2  . Years of education: Not on file  . Highest education level: Not on file  Occupational History  . Occupation: Retired    Fish farm manager: RETIRED  Social Needs  . Financial resource strain: Not on file  .  Food insecurity:     Worry: Not on file    Inability: Not on file  . Transportation needs:    Medical: Not on file    Non-medical: Not on file  Tobacco Use  . Smoking status: Never Smoker  . Smokeless tobacco: Never Used  Substance and Sexual Activity  . Alcohol use: No    Alcohol/week: 0.0 standard drinks  . Drug use: No  . Sexual activity: Never  Lifestyle  . Physical activity:    Days per week: Not on file    Minutes per session: Not on file  . Stress: Not on file  Relationships  . Social connections:    Talks on phone: Not on file    Gets together: Not on file    Attends religious service: Not on file    Active member of club or organization: Not on file    Attends meetings of clubs or organizations: Not on file    Relationship status: Not on file  . Intimate partner violence:    Fear of current or ex partner: Not on file    Emotionally abused: Not on file    Physically abused: Not on file    Forced sexual activity: Not on file  Other Topics Concern  . Not on file  Social History Narrative   Lives home with husband.  Education- graduate level.  Children 2.       PHYSICAL EXAM  GENERAL EXAM/CONSTITUTIONAL: Vitals:  Vitals:   07/07/18 1311  BP: (!) 144/86  Pulse: 86  SpO2: 98%  Weight: 133 lb 3.2 oz (60.4 kg)  Height: 5\' 4"  (1.626 m)   Body mass index is 22.86 kg/m. No exam data present  Patient is in no distress; well developed, nourished and groomed; neck is supple  CARDIOVASCULAR:  Examination of carotid arteries is normal; BILATERAL CAROTID BRUITS  Regular rate and rhythm, SYSTOLIC HEART MURMUR  Examination of peripheral vascular system by observation and palpation is normal  EYES:  Ophthalmoscopic exam of optic discs and posterior segments is normal; no papilledema or hemorrhages  MUSCULOSKELETAL:  Gait, strength, tone, movements noted in Neurologic exam below  NEUROLOGIC: MENTAL STATUS:  No flowsheet data found.  awake, alert, oriented to person, place  and time  recent and remote memory intact  normal attention and concentration  language fluent, comprehension intact, naming intact,   fund of knowledge appropriate  CRANIAL NERVE:   2nd - no papilledema on fundoscopic exam  2nd, 3rd, 4th, 6th - pupils equal and reactive to light, visual fields full to confrontation, extraocular muscles intact, no nystagmus  5th - facial sensation symmetric  7th - facial strength symmetric  8th - hearing intact  9th - palate elevates symmetrically, uvula midline  11th - shoulder shrug symmetric  12th - tongue protrusion midline  MOTOR:   normal bulk and tone, full strength in the BUE, BLE  SENSORY:   normal and symmetric to light touch, temperature, vibration  COORDINATION:   finger-nose-finger, fine finger movements normal  REFLEXES:   deep tendon reflexes present and symmetric  GAIT/STATION:   narrow based gait    DIAGNOSTIC DATA (LABS, IMAGING, TESTING) - I reviewed patient records, labs, notes, testing and imaging myself where available.  Lab Results  Component Value Date   WBC 7.1 05/19/2018   HGB 12.3 05/19/2018   HCT 36.2 05/19/2018   MCV 112.7 Repeated and verified X2. (H) 05/19/2018   PLT 257.0 05/19/2018      Component Value Date/Time  NA 128 (L) 05/19/2018 1602   NA 136 08/18/2016 1148   NA 132 (L) 03/21/2014 1100   K 4.6 05/19/2018 1602   K 4.9 03/21/2014 1100   CL 94 (L) 05/19/2018 1602   CL 97 (L) 03/16/2012 1344   CO2 26 05/19/2018 1602   CO2 26 03/21/2014 1100   GLUCOSE 90 05/19/2018 1602   GLUCOSE 78 03/21/2014 1100   GLUCOSE 84 03/16/2012 1344   BUN 15 05/19/2018 1602   BUN 21 08/18/2016 1148   BUN 16.0 03/21/2014 1100   CREATININE 0.81 05/19/2018 1602   CREATININE 0.9 03/21/2014 1100   CALCIUM 9.5 05/19/2018 1602   CALCIUM 9.9 03/21/2014 1100   PROT 7.4 05/19/2018 1602   PROT 7.3 08/18/2016 1148   PROT 7.3 03/21/2014 1100   ALBUMIN 4.2 05/19/2018 1602   ALBUMIN 4.3 08/18/2016  1148   ALBUMIN 3.5 03/21/2014 1100   AST 19 05/19/2018 1602   AST 22 03/21/2014 1100   ALT 15 05/19/2018 1602   ALT 15 03/21/2014 1100   ALKPHOS 46 05/19/2018 1602   ALKPHOS 51 03/21/2014 1100   BILITOT 0.6 05/19/2018 1602   BILITOT 0.5 08/18/2016 1148   BILITOT 0.36 03/21/2014 1100   GFRNONAA >60 10/21/2017 0339   GFRAA >60 10/21/2017 0339   Lab Results  Component Value Date   CHOL 196 01/03/2016   HDL 63.10 01/03/2016   LDLCALC 116 (H) 01/03/2016   LDLDIRECT 129.8 05/31/2012   TRIG 85.0 01/03/2016   CHOLHDL 3 01/03/2016   No results found for: HGBA1C Lab Results  Component Value Date   VITAMINB12 351 10/21/2017   Lab Results  Component Value Date   TSH 2.52 04/20/2018    10/20/17 MRI brain [I reviewed images myself and agree with interpretation. -VRP]  - No acute or reversible finding. Age related atrophy. Moderate chronic small-vessel ischemic changes of the cerebral hemispheric white matter.  10/20/17 EEG  - This predominantly drowsy and asleep EEG is normal.    01/07/18 carotid u/s  Right Carotid: Velocities in the right ICA are consistent with a 1-39% stenosis.  Left Carotid: Velocities in the left ICA are consistent with a 1-39% stenosis.  Vertebrals:  Bilateral vertebral arteries demonstrate antegrade flow. Subclavians: Normal flow hemodynamics were seen in bilateral subclavian              arteries.      ASSESSMENT AND PLAN  85 y.o. year old female here with new onset seizure disorder, idiopathic. Now stable on divalproex.   Dx:  1. Seizure disorder ([place])      PLAN:  SEIZURE DISORDER (10/19/17; 10/20/17; in setting of temazepam x 1; chronic pain; chronic hyponatremia; unclear etiology)  - stop divalproex; patient was having side effects, and now on very low dose 62.5mg  daily (on her own)  - start vimpat 50mg  twice a day   Meds ordered this encounter  Medications  . lacosamide (VIMPAT) 50 MG TABS tablet    Sig: Take 1 tablet (50 mg total)  by mouth 2 (two) times daily.    Dispense:  60 tablet    Refill:  5   Return in about 6 months (around 01/05/2019) for with NP (Amy Lomax).    Penni Bombard, MD 7/65/4650, 3:54 PM Certified in Neurology, Neurophysiology and Neuroimaging  [facility] [address] [place], [place] [address] [phone]

## 2018-07-09 ENCOUNTER — Ambulatory Visit
Admission: RE | Admit: 2018-07-09 | Discharge: 2018-07-09 | Disposition: A | Discharge status: Home / Self Care | Payer: 59 | Source: Ambulatory Visit | Attending: Obstetrics and Gynecology | Admitting: Obstetrics and Gynecology

## 2018-07-09 ENCOUNTER — Telehealth: Payer: Self-pay | Admitting: Diagnostic Neuroimaging

## 2018-07-09 DIAGNOSIS — R921 Mammographic calcification found on diagnostic imaging of breast: Secondary | ICD-10-CM

## 2018-07-09 DIAGNOSIS — D0512 Intraductal carcinoma in situ of left breast: Secondary | ICD-10-CM | POA: Diagnosis not present

## 2018-07-09 HISTORY — PX: BREAST BIOPSY: SHX20

## 2018-07-09 NOTE — Telephone Encounter (Signed)
Pts husband Marcello Moores (on [place]) states pt was prescribed lacosamide (VIMPAT) 50 MG TABS tablet and they were informed that due to the cost it has to have a P.A. Pharmacy informed them that the form was faxed and husband would like to know the update on this. Please advise.

## 2018-07-12 ENCOUNTER — Encounter: Payer: Self-pay | Admitting: Pharmacist

## 2018-07-12 ENCOUNTER — Ambulatory Visit (INDEPENDENT_AMBULATORY_CARE_PROVIDER_SITE_OTHER): Payer: 59 | Admitting: Pharmacist

## 2018-07-12 DIAGNOSIS — I742 Embolism and thrombosis of arteries of the upper extremities: Secondary | ICD-10-CM

## 2018-07-12 DIAGNOSIS — Z5181 Encounter for therapeutic drug level monitoring: Secondary | ICD-10-CM

## 2018-07-12 DIAGNOSIS — Z86718 Personal history of other venous thrombosis and embolism: Secondary | ICD-10-CM

## 2018-07-12 DIAGNOSIS — D6859 Other primary thrombophilia: Secondary | ICD-10-CM

## 2018-07-12 DIAGNOSIS — Z7901 Long term (current) use of anticoagulants: Secondary | ICD-10-CM

## 2018-07-12 LAB — POCT INR: INR: 2.6 (ref 2.0–3.0)

## 2018-07-12 MED ORDER — WARFARIN SODIUM 5 MG PO TABS: ORAL_TABLET | ORAL | 1 refills | Status: DC

## 2018-07-12 NOTE — Progress Notes (Signed)
Anticoagulation Management Tracy Peters is a 85 y.o. female who reports to the clinic for monitoring of warfarin treatment.    Indication: Chronic anticoagulation, DVT, Hx of, History of thrombosis of right radial artery, Primary hypercoagulable state.    Duration: indefinite Supervising physician: Murriel Hopper  Anticoagulation Clinic Visit History: Patient does not report signs/symptoms of bleeding or thromboembolism  Other recent changes: No diet, medications, lifestyle changes except as noted in patient findings.  Anticoagulation Episode Summary    Current INR goal:   2.0-3.0  TTR:   70.4 % (7 y)  Next INR check:   08/23/2018  INR from last check:     Weekly max warfarin dose:     Target end date:   Indefinite  INR check location:   Anticoagulation Clinic  Preferred lab:     Send INR reminders to:   ANT[facility]   Indications   Chronic anticoagulation [Z79.01] DVT HX OF [Z86.718] 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HCC) [I74.2] Primary hypercoagulable state ([place]) [D68.59] [D68.59] Long term (current) use of anticoagulants [Z79.01] [Z79.01]       Comments:         Anticoagulation Care Providers    Provider Role Specialty Phone number   Annia Belt, MD Referring Oncology [phone]      Allergies  Allergen Reactions  . Bee Venom Anaphylaxis and Other (See Comments)    Yellow jackets, wasps as well  . Ivp Dye [Iodinated Diagnostic Agents] Anaphylaxis  . Shellfish Allergy Anaphylaxis  . Temazepam Other (See Comments)    Severe Hallucinations and Paranoia  . Amlodipine Besylate Swelling  . Aspirin Other (See Comments)    REACTION: unspecified  . Atorvastatin Diarrhea  . Cholestyramine Other (See Comments)    REACTION: mouth irritation  . Codeine Phosphate Nausea And Vomiting  . Demerol [Meperidine] Other (See Comments)    Severe Hallucination!!!!  . Diphenhydramine Hcl Other (See Comments)    hyperactive  . Doxycycline Nausea Only  .  Gentamicin Sulfate Other (See Comments)    REACTION: unspecified  . Keppra [Levetiracetam]     Made her too groggy/ sleepy  . Meclizine Hcl Other (See Comments)    REACTION: more dizziness on it  . Meperidine Hcl Other (See Comments)    Unknown  . Metronidazole Other (See Comments)    REACTION: bad taste  . Moxifloxacin Other (See Comments)    REACTION: insomnia Able to use Cipro  . Sulfamethoxazole Other (See Comments)    Kidney problems  . Penicillins Swelling, Rash and Other (See Comments)    Has patient had a PCN reaction causing immediate rash, facial/tongue/throat swelling, SOB or lightheadedness with hypotension: Yes Has patient had a PCN reaction causing severe rash involving mucus membranes or skin necrosis: No Has patient had a PCN reaction that required hospitalization: No Has patient had a PCN reaction occurring within the last 10 years: No If all of the above answers are "NO", then may proceed with Cephalosporin use.    Prior to Admission medications   Medication Sig Start Date End Date Taking? Authorizing Provider  colesevelam (WELCHOL) 625 MG tablet Take 2 tablets (1,250 mg total) by mouth 2 (two) times daily with a meal. Patient taking differently: Take 1,250 mg by mouth daily with breakfast.  04/20/18 04/20/19 Yes Plotnikov, Evie Lacks, MD  diphenoxylate-atropine (LOMOTIL) 2.5-0.025 MG tablet Take 1-2 tablets by mouth 4 (four) times daily as needed for diarrhea or loose stools. 05/19/18  Yes Plotnikov, Evie Lacks, MD  divalproex (DEPAKOTE)  250 MG DR tablet 0.5 tab bid Patient taking differently: 1/4 tablet in AM. 03/05/18  Yes Plotnikov, Evie Lacks, MD  docusate sodium (COLACE) 100 MG capsule Take 1 capsule (100 mg total) by mouth daily as needed for mild constipation. 10/22/17  Yes Rai, Ripudeep K, MD  EPINEPHrine 0.3 mg/0.3 mL IJ SOAJ injection As dirrected 01/27/17  Yes Plotnikov, Evie Lacks, MD  ferrous sulfate 325 (65 FE) MG tablet Take 1 tablet (325 mg total) by mouth 3  (three) times daily after meals. Patient taking differently: Take 325 mg by mouth every other day.  06/18/17  Yes Babish, Rodman Key, PA-C  fluticasone (FLONASE) 50 MCG/ACT nasal spray Place 2 sprays into both nostrils daily. Patient taking differently: Place 2 sprays into both nostrils daily as needed for allergies or rhinitis.  07/29/17  Yes Plotnikov, Evie Lacks, MD  lacosamide (VIMPAT) 50 MG TABS tablet Take 1 tablet (50 mg total) by mouth 2 (two) times daily. 07/07/18  Yes Penumalli, Earlean Polka, MD  losartan (COZAAR) 100 MG tablet Take 0.5 tablets (50 mg total) by mouth daily. 01/12/18  Yes Plotnikov, Evie Lacks, MD  mupirocin ointment (BACTROBAN) 2 % Apply topically daily. Cleanse wound to right lower leg with NS.  Apply mupirocin ointment to wound.  Cover with 4x4 gauze and wrap with kerlix/tape.  Change daily. 10/23/17  Yes Rai, Ripudeep K, MD  Probiotic Product (ALIGN PO) Take by mouth. One cap by mouth Bid.   Yes [provider]  triamcinolone ointment (KENALOG) 0.1 % Apply 1 application topically 3 (three) times daily. 04/20/18  Yes Plotnikov, Evie Lacks, MD  valACYclovir (VALTREX) 1000 MG tablet TAKE 1 TABLET BY MOUTH TWICE DAILY. 02/07/18  Yes Plotnikov, Evie Lacks, MD  warfarin (COUMADIN) 5 MG tablet Take 1 and 1/2 tablets on Sundays, Tuesdays, Thursdays and Saturdays. All other days, take only one (1) tablet. 07/12/18  Yes Pennie Banter, RPH-CPP  zolpidem (AMBIEN) 10 MG tablet 1 & 1/2 TABLETS (15MG ) BY MOUTH AT BEDTIME AS NEEDED FOR SLEEP, OK TOREPEAT IN 4HRS 03/05/18  Yes Plotnikov, Evie Lacks, MD   Past Medical History:  Diagnosis Date  . Adenomatous colon polyp   . Allergic rhinitis    uses FLonase nightly  . Anemia   . Arthritis    joint pain  . Blood transfusion   . Bronchitis    hx of-7-11yrs ago  . Carpal tunnel syndrome    numbness and tingling   . Cataracts, bilateral   . Chronic anticoagulation 03/21/2013  . Congenital absence of one kidney   . Diarrhea    takes Immodium  daily as needed or Lomotil   . Diverticulosis   . GERD (gastroesophageal reflux disease)    mild  . Herpes    takes Valtrex daily  . History of colon polyps   . History of DVT (deep vein thrombosis) 1998   right arm  . History of staph infection   . Hyperlipidemia    Medical MD doesn't think its absolutely necessary  . Hypertension    takes Losartan daily  . IBS (irritable bowel syndrome)    takes Electronics engineer daily  . Insomnia    takes Ambien nightly as needed  . LBP (low back pain)   . Long term (current) use of anticoagulants    Dr. Beryle Beams  . Nocturia   . Osteoporosis   . Raynaud's disease   . 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 Winfield Cunas  . Seizures ([place]) 10/20/2017  .  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[place]) 09/16/2011   Right digital artery 1998 idiopathic   . Urinary leakage   . UTI (urinary tract infection)    Social History   Socioeconomic History  . Marital status: Married    Spouse name: Not on file  . Number of children: 2  . Years of education: Not on file  . Highest education level: Not on file  Occupational History  . Occupation: Retired    Fish farm manager: RETIRED  Social Needs  . Financial resource strain: Not on file  . Food insecurity:    Worry: Not on file    Inability: Not on file  . Transportation needs:    Medical: Not on file    Non-medical: Not on file  Tobacco Use  . Smoking status: Never Smoker  . Smokeless tobacco: Never Used  Substance and Sexual Activity  . Alcohol use: No    Alcohol/week: 0.0 standard drinks  . Drug use: No  . Sexual activity: Never  Lifestyle  . Physical activity:    Days per week: Not on file    Minutes per session: Not on file  . Stress: Not on file  Relationships  . Social connections:    Talks on phone: Not on file    Gets together: Not on file    Attends religious service: Not on file    Active member of club or organization: Not on file    Attends meetings of clubs or organizations: Not on file     Relationship status: Not on file  Other Topics Concern  . Not on file  Social History Narrative   Lives home with husband.  Education- graduate level.  Children 2.     Family History  Problem Relation Age of Onset  . Breast cancer Sister   . Kidney disease Sister 59       nephritis  . Parkinsonism Mother   . Heart disease Brother   . Osteoarthritis Other   . Ulcerative colitis Sister   . Kidney disease Sister   . Colon cancer Neg Hx     ASSESSMENT Recent Results: The most recent result is correlated with 45 mg per week: Lab Results  Component Value Date   INR 2.6 07/12/2018   INR 2.5 05/31/2018   INR 1.4 (A) 05/10/2018   PROTIME 39.6 (H) 04/03/2015    Anticoagulation Dosing: Description   Take 1 & 1/2 tablets of your 5mg  peach-colored warfarin tablets by mouth, once-daily at [facility] on Sundays,Tuesdays, Thursdays and Saturdays. All other days--take ONLY one (1) of your 5mg  peach-colored warfarin tablets.   DR Azucena Freed PATIENT (route warfarin notes and list as authorizing provider for LOS & Follow-up, lab orders and warfarin prescriptions), do not list attending physician      INR today: Therapeutic  PLAN Weekly dose was unchanged. Patient will remain on 45mg  warfarin per week. Patient is switching from Depakote to Vimpat as her anti-seizure medication per Neurology.   Patient Instructions  Patient instructed to take medications as defined in the Anti-coagulation Track section of this encounter.  Patient instructed to take today's dose.  Patient instructed to take 1 & 1/2 tablets of your 5mg  peach-colored warfarin tablets by mouth, once-daily at [facility] on Sundays,Tuesdays, Thursdays and Saturdays. All other days--take ONLY one (1) of your 5mg  peach-colored warfarin tablets.  Patient verbalized understanding of these instructions.     Patient advised to contact clinic or seek medical attention if signs/symptoms of bleeding or thromboembolism occur.  Patient  verbalized understanding by repeating back information and was advised to contact me if further medication-related questions arise. Patient was also provided an information handout.  Follow-up Return in 6 weeks (on 08/23/2018) for Follow up INR at 1130h.  Pennie Banter , PharmD, CPP 15 minutes spent face-to-face with the patient during the encounter. 50% of time spent on education. 50% of time was spent on fingerstick point of care INR sample collection, processing, results determination, drug-drug-interaction software program to evaluate potential DDI of warfarin and Vimpat (No interaction) an documentation in http://www.kim.net/.

## 2018-07-12 NOTE — Telephone Encounter (Signed)
Initiated [place] #  [id number]. vimpat.

## 2018-07-12 NOTE — Telephone Encounter (Addendum)
Spoke to husband.  He stated that [facility] will send to pharmacy for them to pick up.   I ? Pharmacy as we [facility] prescription to [facility] [place].  [facility] has only been dealing with [facility].??  I completed PA on CMM.  I called  Husband and relayed that did make calls to [facility] and [facility].  I went ahead and initiated PA for VIMPAT on CMM, awaiting determination could be up to 72 hours.

## 2018-07-12 NOTE — Telephone Encounter (Signed)
Pt husband(on DPR) states medication for pt still needs to be approved.  Please call

## 2018-07-12 NOTE — Patient Instructions (Signed)
Patient instructed to take medications as defined in the Anti-coagulation Track section of this encounter.  Patient instructed to take today's dose.  Patient instructed to take 1 & 1/2 tablets of your 5mg  peach-colored warfarin tablets by mouth, once-daily at [facility] on Sundays,Tuesdays, Thursdays and Saturdays. All other days--take ONLY one (1) of your 5mg  peach-colored warfarin tablets.  Patient verbalized understanding of these instructions.

## 2018-07-12 NOTE — Progress Notes (Signed)
Reviewed Thanks DrG Please add current INR to your note

## 2018-07-15 ENCOUNTER — Telehealth: Payer: Self-pay | Admitting: Diagnostic Neuroimaging

## 2018-07-15 ENCOUNTER — Other Ambulatory Visit: Payer: Self-pay | Admitting: General Surgery

## 2018-07-15 DIAGNOSIS — Z7901 Long term (current) use of anticoagulants: Secondary | ICD-10-CM | POA: Diagnosis not present

## 2018-07-15 DIAGNOSIS — D0512 Intraductal carcinoma in situ of left breast: Secondary | ICD-10-CM | POA: Diagnosis not present

## 2018-07-15 DIAGNOSIS — Z96641 Presence of right artificial hip joint: Secondary | ICD-10-CM | POA: Diagnosis not present

## 2018-07-15 DIAGNOSIS — Z9049 Acquired absence of other specified parts of digestive tract: Secondary | ICD-10-CM | POA: Diagnosis not present

## 2018-07-15 DIAGNOSIS — C44729 Squamous cell carcinoma of skin of left lower limb, including hip: Secondary | ICD-10-CM | POA: Diagnosis not present

## 2018-07-15 DIAGNOSIS — Q600 Renal agenesis, unilateral: Secondary | ICD-10-CM | POA: Diagnosis not present

## 2018-07-15 DIAGNOSIS — I82721 Chronic embolism and thrombosis of deep veins of right upper extremity: Secondary | ICD-10-CM | POA: Diagnosis not present

## 2018-07-15 DIAGNOSIS — Z90710 Acquired absence of both cervix and uterus: Secondary | ICD-10-CM | POA: Diagnosis not present

## 2018-07-15 NOTE — Telephone Encounter (Signed)
Pts husband [name] (on [place]) called stating that the insurance declined her lacosamide (VIMPAT) 50 MG TABS tablet they stated to him that if provider would like to appeal it we can call [phone] or have a medication change. Please advise.

## 2018-07-15 NOTE — Telephone Encounter (Signed)
Received denial for vimpat. See report on desk for other sz options.

## 2018-07-19 ENCOUNTER — Encounter: Payer: Self-pay | Admitting: *Deleted

## 2018-07-19 NOTE — Telephone Encounter (Signed)
Appeal letter done, to Dr. Leta Baptist for review, edits if needed, and signature.

## 2018-07-19 NOTE — Telephone Encounter (Signed)
I spoke to pts husband.  Will appeal.  She will keep on depakote until determination.

## 2018-07-20 ENCOUNTER — Telehealth: Payer: Self-pay | Admitting: *Deleted

## 2018-07-20 NOTE — Telephone Encounter (Signed)
-----   Message from Annia Belt, MD sent at 07/20/2018  2:57 PM EST ----- Regarding: RE: Surgical Clearance Form filled out; reviewed plan w Dr Elie Confer who will call pt. & fax back to surgery  ----- Message ----- From: Ebbie Latus, RN Sent: 07/19/2018  10:45 AM EST To: Annia Belt, MD Subject: Surgical Clearance                             Dr Darnell Level Received surgical clearance request on Mrs Baity - I will put fax in your mailbox.  She's having a lumpectomy.Thanks [employer]

## 2018-07-20 NOTE — Telephone Encounter (Signed)
Signed appeal letter faxed to [phone] [place] [facility] for urgent request.  (fax confirmation received).

## 2018-07-21 ENCOUNTER — Telehealth: Payer: Self-pay | Admitting: Diagnostic Neuroimaging

## 2018-07-21 NOTE — Telephone Encounter (Signed)
Pt's husband called wanting an update on the appeal. Please advise.

## 2018-07-21 NOTE — Telephone Encounter (Signed)
Spoke to husband of pt.  Relayed that appeal for vimpat sent to urgent fax # yesterday.  Determination pending.  He verbalized understanding.

## 2018-07-28 NOTE — Telephone Encounter (Signed)
I had spoken to [place] then Faith at [facility] relating to determination of appeal for vimpat.  Per Faith, had no record of appeal letter being received.  (faxed from 07-20-2018). I could not speak to anyone in appeals, did not have # to that department.    Refaxed to # she gave me [phone] (fax confirmation received).  Faith then returned call and relayed that approval was given for Vimpat REF # [id number]  07-26-2018 thru 07-27-19.  Charted by MC/RN and fax confirmation received to [facility].  LMVM for pt that approval was received and [facility] pharm notified.  She is to call back if questions.

## 2018-07-28 NOTE — Telephone Encounter (Addendum)
Received fax from [place] re: Vimpat previously approved 07/26/18 until 07/27/2019. If pharmacy cannot fill, they need to contact [facility] help desk, [phone]. Faxed letter to [facility].

## 2018-07-30 ENCOUNTER — Other Ambulatory Visit: Payer: Self-pay

## 2018-07-30 ENCOUNTER — Encounter (HOSPITAL_BASED_OUTPATIENT_CLINIC_OR_DEPARTMENT_OTHER): Payer: Self-pay

## 2018-07-30 NOTE — Progress Notes (Signed)
[facility]  [phone] [phone]    ID: Anuva Andazola DOB: 04-04-1934  MR#: [id number]  NGE#:[id number]  Patient Care Team: Tresa Garter, MD as PCP - General Francena Hanly, MD (Orthopedic Surgery) Levert Feinstein, MD (Hematology and Oncology) Richarda Overlie, MD (Obstetrics and Gynecology) Hart Carwin, MD (Inactive) (Gastroenterology) Tressie Stalker, MD as Consulting Physician (Neurosurgery) Ollen Gross, MD as Consulting Physician (Orthopedic Surgery) Durene Romans, MD as Consulting Physician (Orthopedic Surgery) Hlee Fringer, Valentino Hue, MD as Consulting Physician (Oncology) Claud Kelp, MD as Consulting Physician (General Surgery) OTHER MD:   CHIEF COMPLAINT: ductal carcinoma is situ, estrogen receptor positive  CURRENT TREATMENT: awaiting definitive surgery   HISTORY OF CURRENT ILLNESS: Jerzey Dechene underwent routine screening mammography in early 06/2018 showing a change in her left breast. She underwent unilateral left diagnostic mammography with tomography [facility] on 07[facility]06[facility]2018 showing: Breast Density Category C. There is a 0.7 cm group of amorphous calcifications within the lower inner left breast posterior depth.  Accordingly on 07/09/2018 she proceeded to biopsy of the left breast area in question. The pathology from this procedure showed ([id number]): ductal carcinoma in situ, low nuclear grade with calcifications. Prognostic indicators significant for: estrogen receptor, 100% positive with strong staining intensity and progesterone receptor, 10% positive, with moderate staining intensity.   The patient's subsequent history is as detailed below.   INTERVAL HISTORY: Hendy was evaluated in the multidisciplinary breast cancer clinic on 08/03/2018 accompanied by her husband, Elijah Birk.      Results for NELISSA, OEHLERT (MRN [id number]) as of 08/03/2018 16:04  Ref. Range 05/10/2018  11:30 05/31/2018 14:50 07/12/2018 15:08 08/02/2018 15:30  INR Latest Ref Range: 2.0 - 3.0  1.4 (A) 2.5 2.6 2.2   She is scheduled for a left breast lumpectomy on 08/06/2018 wit Dr. Claud Kelp.    REVIEW OF SYSTEMS: Amarelis denies unusual headaches, visual changes, nausea, vomiting, stiff neck, dizziness, or gait imbalance. There has been no cough, phlegm production, or pleurisy, no chest pain or pressure, and no change in bowel or bladder habits. The patient denies fever, rash, bleeding, unexplained fatigue or unexplained weight loss. A detailed review of systems was otherwise entirely negative.   PAST MEDICAL HISTORY: Past Medical History:  Diagnosis Date  . Adenomatous colon polyp   . Allergic rhinitis    uses FLonase nightly  . Anemia   . Arthritis    joint pain  . Blood transfusion   . Bronchitis    hx of-7-6yrs ago  . Carpal tunnel syndrome    numbness and tingling   . Cataracts, bilateral   . Chronic anticoagulation 03/21/2013  . Congenital absence of one kidney    pt only has one kidney  . Diarrhea    takes Immodium daily as needed or Lomotil   . Diverticulosis   . GERD (gastroesophageal reflux disease)    mild  . Herpes    takes Valtrex daily  . History of colon polyps   . History of DVT (deep vein thrombosis) 1998   right arm  . History of staph infection   . Hyperlipidemia    Medical MD doesn't think its absolutely necessary  . Hypertension    takes Losartan daily  . IBS (irritable bowel syndrome)    takes Librarian, academic daily  . Insomnia    takes Ambien nightly as needed  . LBP (low back pain)   . Long term (current) use of anticoagulants    Dr. Cyndie Chime  .  Nocturia   . Osteoporosis   . Raynaud's disease   . Sarcoma of left lower extremity (HCC)    Dr. Elie Confer  . Seizures (HCC) 10/20/2017   has not had a seizure since may 2019  . 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 Urinary leakage   . UTI  (urinary tract infection)      PAST SURGICAL HISTORY: Past Surgical History:  Procedure Laterality Date  .  kidney disease left     . ABDOMINAL HYSTERECTOMY    . ANTERIOR CERVICAL DECOMP/DISCECTOMY FUSION N/A 01/18/2014   Procedure: ANTERIOR CERVICAL DECOMPRESSION/DISCECTOMY FUSION 3 LEVELS  Cervical  four/five, five/six, six/seven anterior cervical decompression with fusion interbody prosthesis with plating and bonegraft;  Surgeon: Tressie Stalker, MD;  Location: [facility];  Service: Neurosurgery;  Laterality: N/A;  t  . APPENDECTOMY    . BACK SURGERY     2  . BREAST BIOPSY Left 30+ yrs ago   benign  . BREAST SURGERY     cystectomy-benign  . CARPAL TUNNEL RELEASE Right 06/12/2014   Procedure: CARPAL TUNNEL RELEASE;  Surgeon: Tressie Stalker, MD;  Location: [facility];  Service: Neurosurgery;  Laterality: Right;  Right Carpal Tunnel Release  . CATARACT EXTRACTION Bilateral 02/15/2015  . CHOLECYSTECTOMY    . COLONOSCOPY    . ECTOPIC PREGNANCY SURGERY  1959  . HIP CLOSED REDUCTION Right 08/23/2017   Procedure: CLOSED MANIPULATION HIP;  Surgeon: Yolonda Kida, MD;  Location: [facility];  Service: Orthopedics;  Laterality: Right;  . I&D of abdomen  1988   couple of wks after gallbladder removed  . KNEE SURGERY     left x 3  . LUMBAR LAMINECTOMY     X 2  . mortons neuromas removed    . OPEN SURGICAL REPAIR OF GLUTEAL TENDON Right 06/15/2017   Procedure: OPEN SURGICAL REPAIR OF GLUTEALmedius TENDON;  Surgeon: Durene Romans, MD;  Location: [facility];  Service: Orthopedics;  Laterality: Right;  . SHOULDER SURGERY     Right  . TOTAL HIP ARTHROPLASTY Right 06/15/2017   Procedure: Right total hip arthroplasty, bursectomy, repair of gluteal tendon, posterior approach;  Surgeon: Durene Romans, MD;  Location: [facility];  Service: Orthopedics;  Laterality: Right;  90 mins for all procedures together     FAMILY HISTORY: Family History  Problem Relation Age of Onset  . Breast cancer  Sister   . Kidney disease Sister 49       nephritis  . Parkinsonism Mother   . Heart disease Brother   . Osteoarthritis Other   . Ulcerative colitis Sister   . Kidney disease Sister   . Colon cancer Neg Hx    Alda's father died from complications after a fall at age 50. Patients' mother died from natural causes at age 29. The patient has 3 brothers and 6 sisters. Myka has a sister that was diagnosed with breast cancer in her mid 21's and two nephews that were diagnosed with prostate cancer. Patient denies anyone in her family having ovarian or pancreatic cancer. Ilisha has a brother that was diagnosed with testicular cancer at 29.    GYNECOLOGIC HISTORY:  No LMP recorded. Patient has had a hysterectomy. 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 Her husband, Elijah Birk, is the Psychologist, prison and probation services for [employer]. Jahniece has two children, Bjorn Loser and Hooper. Bjorn Loser is 33, lives in [place], and works at [employer]. Ronaldo Miyamoto is 47, lives in [place], and is an Art gallery manager. Janesa has two blood grandchildren and one step-grandchild. She is a International aid/development worker.    ADVANCED DIRECTIVES: Her husband, Elijah Birk, is automatically her healthcare power of attorney.     HEALTH MAINTENANCE: Social History   Tobacco Use  . Smoking status: Never Smoker  . Smokeless tobacco: Never Used  Substance Use Topics  . Alcohol use: No    Alcohol/week: 0.0 standard drinks  . Drug use: No    Colonoscopy: Due in 2022  PAP: Hysterectomy, pelvic exams only  Bone density: yes, "bone loss" - prescribed injections, but she didn't receive them.   Allergies  Allergen  Reactions  . Bee Venom Anaphylaxis and Other (See Comments)    Yellow jackets, wasps as well  . Ivp Dye [Iodinated Diagnostic Agents] Anaphylaxis  . Shellfish Allergy Anaphylaxis  . Temazepam Other (See Comments)    Severe Hallucinations and Paranoia  . Amlodipine Besylate Swelling  . Aspirin Other (See Comments)    REACTION: break out in sweats  . Atorvastatin Diarrhea  . Cholestyramine Other (See Comments)    REACTION: mouth irritation  . Codeine Phosphate Nausea And Vomiting  . Demerol [Meperidine] Other (See Comments)    Severe Hallucination!!!!  . Diphenhydramine Hcl Other (See Comments)    hyperactive  . Doxycycline Nausea Only  . Gentamicin Sulfate Other (See Comments)    REACTION: Shortness of breath   . Keppra [Levetiracetam]     Made her too groggy/ sleepy  . Meclizine Hcl Other (See Comments)    REACTION: more dizziness on it  . Meperidine Hcl Other (See Comments)    Unknown  . Metronidazole Other (See Comments)    REACTION: bad taste  . Moxifloxacin Other (See Comments)    REACTION: insomnia Able to use Cipro  . Sulfamethoxazole Other (See Comments)    Kidney problems  . Penicillins Swelling, Rash and Other (See Comments)    Has patient had a PCN reaction causing immediate rash, facial/tongue/throat swelling, SOB or lightheadedness with hypotension: Yes Has patient had a PCN reaction causing severe rash involving mucus membranes or skin necrosis: No Has patient had a PCN reaction that required hospitalization: No Has patient had a PCN reaction occurring within the last 10 years: No If all of the above answers are "NO", then may proceed with Cephalosporin use.     Current Outpatient Medications  Medication Sig Dispense Refill  . colesevelam (WELCHOL) 625 MG tablet Take 2 tablets (1,250 mg total) by mouth 2 (two) times daily with a meal. (Patient not taking: Reported on 08/03/2018) 120 tablet 11  . diphenoxylate-atropine (LOMOTIL) 2.5-0.025 MG tablet Take 1-2  tablets by mouth 4 (four) times daily as needed for diarrhea or loose stools. 120 tablet 2  . enoxaparin (LOVENOX) 100 MG/ML injection Inject 0.9 mLs (90 mg total) into the skin daily. (Patient not taking: Reported on 08/03/2018) 10 Syringe 1  . EPINEPHrine 0.3 mg/0.3 mL IJ SOAJ injection As dirrected 1 Device 3  . ferrous sulfate 325 (65 FE) MG tablet Take 1 tablet (325 mg total) by mouth 3 (three) times daily after meals. (Patient taking differently: Take 325 mg by mouth  every other day. )  3  . fluticasone (FLONASE) 50 MCG/ACT nasal spray Place 2 sprays into both nostrils daily. (Patient taking differently: Place 2 sprays into both nostrils daily as needed for allergies or rhinitis. ) 48 g 3  . lacosamide (VIMPAT) 50 MG TABS tablet Take 1 tablet (50 mg total) by mouth 2 (two) times daily. 60 tablet 5  . losartan (COZAAR) 100 MG tablet Take 0.5 tablets (50 mg total) by mouth daily. 90 tablet 3  . Probiotic Product (ALIGN PO) Take by mouth. One cap by mouth Bid.    . valACYclovir (VALTREX) 1000 MG tablet TAKE 1 TABLET BY MOUTH TWICE DAILY. 180 tablet 1  . warfarin (COUMADIN) 5 MG tablet Take 1 and 1/2 tablets on Sundays, Tuesdays, Thursdays and Saturdays. All other days, take only one (1) tablet. 108 tablet 1  . zolpidem (AMBIEN) 10 MG tablet 1 & 1/2 TABLETS (15MG ) BY MOUTH AT BEDTIME AS NEEDED FOR SLEEP, OK TOREPEAT IN 4HRS 135 tablet 1   No current facility-administered medications for this visit.      OBJECTIVE: Older white woman in no acute distress  Vitals:   08/03/18 1548  BP: (!) 166/94  Pulse: 74  Resp: 18  Temp: 98.6 F (37 C)  SpO2: 98%     Body mass index is 22.74 kg/m.   Wt Readings from Last 3 Encounters:  08/03/18 132 lb 8 oz (60.1 kg)  08/03/18 132 lb 9.6 oz (60.1 kg)  07/07/18 133 lb 3.2 oz (60.4 kg)      ECOG FS:1 - Symptomatic but completely ambulatory  Ocular: Sclerae unicteric, EOMs intact Ear-nose-throat: Oropharynx clear and moist Lymphatic: No cervical or  supraclavicular adenopathy Lungs no rales or rhonchi Heart regular rate and rhythm Abd soft, nontender, positive bowel sounds MSK no focal spinal tenderness, no joint edema Neuro: non-focal, well-oriented, appropriate affect Breasts: I do not palpate a mass in either breast.  There are no skin or nipple changes of concern.  Both axillae are benign.   LAB RESULTS:  CMP     Component Value Date/Time   NA 128 (L) 05/19/2018 1602   NA 136 08/18/2016 1148   NA 132 (L) 03/21/2014 1100   K 4.6 05/19/2018 1602   K 4.9 03/21/2014 1100   CL 94 (L) 05/19/2018 1602   CL 97 (L) 03/16/2012 1344   CO2 26 05/19/2018 1602   CO2 26 03/21/2014 1100   GLUCOSE 90 05/19/2018 1602   GLUCOSE 78 03/21/2014 1100   GLUCOSE 84 03/16/2012 1344   BUN 15 05/19/2018 1602   BUN 21 08/18/2016 1148   BUN 16.0 03/21/2014 1100   CREATININE 0.81 05/19/2018 1602   CREATININE 0.9 03/21/2014 1100   CALCIUM 9.5 05/19/2018 1602   CALCIUM 9.9 03/21/2014 1100   PROT 7.4 05/19/2018 1602   PROT 7.3 08/18/2016 1148   PROT 7.3 03/21/2014 1100   ALBUMIN 4.2 05/19/2018 1602   ALBUMIN 4.3 08/18/2016 1148   ALBUMIN 3.5 03/21/2014 1100   AST 19 05/19/2018 1602   AST 22 03/21/2014 1100   ALT 15 05/19/2018 1602   ALT 15 03/21/2014 1100   ALKPHOS 46 05/19/2018 1602   ALKPHOS 51 03/21/2014 1100   BILITOT 0.6 05/19/2018 1602   BILITOT 0.5 08/18/2016 1148   BILITOT 0.36 03/21/2014 1100   GFRNONAA >60 10/21/2017 0339   GFRAA >60 10/21/2017 0339    Lab Results  Component Value Date   TOTALPROTELP 6.7 10/21/2011    No results found for: KPAFRELGTCHN,  LAMBDASER, [facility]  Lab Results  Component Value Date   WBC 7.1 05/19/2018   NEUTROABS 3.8 05/19/2018   HGB 12.3 05/19/2018   HCT 36.2 05/19/2018   MCV 112.7 Repeated and verified X2. (H) 05/19/2018   PLT 257.0 05/19/2018    @LASTCHEMISTRY @  No results found for: LABCA2  No components found for: [id number]  Recent Labs  Lab 08/02/18 1530  INR 2.2     No results found for: LABCA2  No results found for: EXB284  No results found for: XLK440  No results found for: NUU725  No results found for: CA2729  No components found for: HGQUANT  No results found for: CEA1 / No results found for: CEA1   No results found for: AFPTUMOR  No results found for: CHROMOGRNA  No results found for: PSA1  Anti-coag visit on 08/02/2018  Component Date Value Ref Range Status  . INR 08/02/2018 2.2  2.0 - 3.0 Final    (this displays the last labs from the last 3 days)  Lab Results  Component Value Date   TOTALPROTELP 6.7 10/21/2011   (this displays SPEP labs)  No results found for: KPAFRELGTCHN, LAMBDASER, KAPLAMBRATIO (kappa/lambda light chains)  No results found for: HGBA, HGBA2QUANT, HGBFQUANT, HGBSQUAN (Hemoglobinopathy evaluation)   Lab Results  Component Value Date   LDH 181 03/17/2011    Lab Results  Component Value Date   IRON 102 10/30/2017   TIBC 310 10/30/2017   IRONPCTSAT 33 10/30/2017   (Iron and TIBC)  Lab Results  Component Value Date   FERRITIN 171 10/30/2017    Urinalysis    Component Value Date/Time   COLORURINE YELLOW 04/20/2018 1400   APPEARANCEUR Cloudy (A) 04/20/2018 1400   LABSPEC 1.010 04/20/2018 1400   PHURINE 6.0 04/20/2018 1400   GLUCOSEU NEGATIVE 04/20/2018 1400   HGBUR LARGE (A) 04/20/2018 1400   BILIRUBINUR NEGATIVE 04/20/2018 1400   BILIRUBINUR neg 06/15/2015 1110   KETONESUR NEGATIVE 04/20/2018 1400   PROTEINUR NEGATIVE 10/20/2017 0630   UROBILINOGEN 0.2 04/20/2018 1400   NITRITE POSITIVE (A) 04/20/2018 1400   LEUKOCYTESUR LARGE (A) 04/20/2018 1400     STUDIES:  Mm Digital Diagnostic Unilat L  Result Date: 07/06/2018 CLINICAL DATA:  Patient recalled from screening for left breast calcifications. EXAM: DIGITAL DIAGNOSTIC LEFT MAMMOGRAM WITH CAD COMPARISON:  Previous exam(s). ACR Breast Density Category c: The breast tissue is heterogeneously dense, which may obscure small  masses. FINDINGS: Magnification CC and true lateral views of the left breast were obtained. There is a 7 mm group of amorphous calcifications within the lower inner left breast posterior depth. Mammographic images were processed with CAD. IMPRESSION: Indeterminate left breast calcifications RECOMMENDATION: Stereotactic guided core needle biopsy indeterminate left breast calcifications. I have discussed the findings and recommendations with the patient. Results were also provided in writing at the conclusion of the visit. If applicable, a reminder letter will be sent to the patient regarding the next appointment. BI-RADS CATEGORY  4: Suspicious. Electronically Signed   By: Annia Belt M.D.   On: 07/06/2018 16:36   Mm Clip Placement Left  Result Date: 07/09/2018 CLINICAL DATA:  85 year old female status post stereotactic biopsy of left breast calcifications. EXAM: DIAGNOSTIC LEFT MAMMOGRAM POST STEREOTACTIC BIOPSY COMPARISON:  Previous exam(s). FINDINGS: Mammographic images were obtained following stereotactic guided biopsy of the left breast. Coil shaped clip is identified in the lower inner quadrant at far posterior depth. This is in the appropriate location. IMPRESSION: Post biopsy clip in the expected location. Final Assessment: Post  Procedure Mammograms for Marker Placement Electronically Signed   By: Sande Brothers M.D.   On: 07/09/2018 13:40   Mm Lt Breast Bx W Loc Dev 1st Lesion Image Bx Spec Stereo Guide  Addendum Date: 07/13/2018   ADDENDUM REPORT: 07/13/2018 06:31 ADDENDUM: Pathology revealed LOW NUCLEAR GRADE DUCTAL CARCINOMA IN SITU WITH CALCIFICATIONS of the Left breast, lower inner quadrant, posterior. Ductal carcinoma in situ shows a papillary and micropapillary architecture. This was found to be concordant by Dr. Sande Brothers. Pathology results were discussed with the patient by telephone. The patient reported doing well after the biopsy with tenderness and bruising at the site. Post biopsy  instructions and care were reviewed and questions were answered. The patient was encouraged to call [facility] for any additional concerns. Surgical consultation has been arranged with Dr. Claud Kelp at [facility] on July 16, 2018. Pathology results reported by Rene Kocher, RN on 07/13/2018. Electronically Signed   By: Sande Brothers M.D.   On: 07/13/2018 06:31   Result Date: 07/13/2018 CLINICAL DATA:  85 year old female with indeterminate left breast calcifications. EXAM: LEFT BREAST STEREOTACTIC CORE NEEDLE BIOPSY COMPARISON:  Previous exams. FINDINGS: The patient and I discussed the procedure of stereotactic-guided biopsy including benefits and alternatives. We discussed the high likelihood of a successful procedure. We discussed the risks of the procedure including infection, bleeding, tissue injury, clip migration, and inadequate sampling. Informed written consent was given. The usual time out protocol was performed immediately prior to the procedure. Using sterile technique and 1% Lidocaine as local anesthetic, under stereotactic guidance, a 9 gauge vacuum assisted device was used to perform core needle biopsy of calcifications in the lower inner quadrant of the left breast using a lateral approach. Specimen radiograph was performed showing calcifications in several specimens. Specimens with calcifications are identified for pathology. Lesion quadrant: Lower inner quadrant At the conclusion of the procedure, a coil shaped tissue marker clip was deployed into the biopsy cavity. Follow-up 2-view mammogram was performed and dictated separately. IMPRESSION: Stereotactic-guided biopsy of left breast calcifications. No apparent complications. Electronically Signed: By: Sande Brothers M.D. On: 07/09/2018 13:40     ELIGIBLE FOR AVAILABLE RESEARCH PROTOCOL: refused COMET   ASSESSMENT: 85 y.o. 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 Specifically we reviewed the biology of the patient's diagnosis and the specifics of her situation.  Cathleen understands that in noninvasive ductal carcinoma, also called ductal carcinoma in situ ("DCIS") the breast cancer cells remain trapped in the ducts were they started. They cannot travel to a vital organ. For that reason these cancers in themselves are not life-threatening.  If the whole breast is removed then all the ducts are removed and since the cancer cells are trapped in the ducts, the cure rate with mastectomy for noninvasive breast cancer is approximately 99%. Nevertheless we recommend lumpectomy, because there is no survival advantage to mastectomy and because the cosmetic result is generally superior with breast conservation.  Since the patient is keeping her breast, there will be some risk of recurrence. The  recurrence can only be in the same breast since, again, the cells are trapped in the ducts. There is no connection from one breast to the other. The risk of local recurrence is cut by more than half with radiation, which is standard in this situation.  In estrogen receptor positive cancers like Bev's, anti-estrogens can also be considered. They will further reduce the risk of recurrence by one half. In addition anti-estrogens will lower the risk of a new breast cancer developing in either breast, also by one half. That risk approaches 1% per year.   However given the size and grade of this tumor I would estimate the risk of local recurrence after  radiation to be less than 5%.  Similarly, while the patient's risk of developing another breast cancer will approximate 1 %/year, given her current age a 16% risk would be optimistic.  This means she would have at least 1084% chance of not developing another breast cancer in the future.  Antiestrogens would only minimally affect those numbers but would expose the patient to possible problems with clotting or worsening osteoporosis.  Accordingly I do not recommend antiestrogens in this case.  The plan is for surgery, radiation, then observation.  Tiffony does qualify for genetics testing.  In patients who carry a deleterious mutation [for example in a  BRCA gene], the risk of a new breast cancer developing in the future may be sufficiently great that the patient may choose bilateral mastectomies. However if she wishes to keep her breasts in that situation it is safe to do so. That would require intensified screening, which generally means not only yearly mammography but a yearly breast MRI as well.  Again, given the patient's current age, intensified screening would be the option if there is a deleterious mutation found  Shenese has a good understanding of the overall plan. She agrees with it. She knows the goal of treatment in her case is cure. She will call with any problems that may develop before her next visit here, which will be sometime in July.Darnelle Catalan, Valentino Hue, MD  08/03/18 4:55 PM Medical Oncology and Hematology [facility] [address] [place], [place] [address] Tel. [phone]    Fax. [phone]   I, Mal Misty am acting as a Neurosurgeon for Lowella Dell, MD.   I, Ruthann Cancer MD, have reviewed the above documentation for accuracy and completeness, and I agree with the above.

## 2018-08-01 NOTE — H&P (Signed)
Tracy Peters Location: [facility] Patient #: [id number] DOB: [birth year] Married / Language: English / Race: White Female       History of Present Illness       . This is a pleasant 85 year old female. Her husband is with her throughout the catheter. She is referred by Dr. Mancel Bale for evaluation and management of ductal carcinoma in situ of the left breast. Imaging studies were performed at [facility] by Dr. Chipper Oman. Dr. Jennette Banker cough is her PCP. Dr. Cyndie Chime is her hematologist. Fayrene Fearing gross, Pharm.D helps regulate her Coumadin and lovenox bridges when necessary.     Last mammogram 2 years ago. Recent screening mammogram shows a 7 mm cluster of calcifications in the left breast, lower inner quadrant. Image guided biopsy shows low-grade DCIS. ER 100%, PR 10%. She got a bruise but no hematoma.     Comorbidities include venous thrombosis syndrome of uncertain cause. She developed spontaneous DVT of her right arm and has been on Coumadin for many years. She is followed by Dr. Cyndie Chime for this. She has congenital absence of one kidney. Seizures in the past. Abdominal hysterectomy. Appendectomy. Open cholecystectomy. Left breast biopsy 40 years ago. Lumbar laminectomy. Squamous cell carcinoma skin left pretibial area. Right total hip replacement. Family history a sister had breast cancer, had a mastectomy, but died of other causes. Mother had parkinsonism. Brother living age 59 has testicular cancer. Nephew died of prostate cancer. Social history she is remarried. Here with her second husband. 2 children from her first marriage. Denies tobacco. Does not drink alcohol at this time.      We had a very long discussion I outlined surgical options. We talked about whether she would receive radiation therapy or not. We talked about antiestrogen therapy. Talked about venous thromboembolism as it relates to antiestrogen's. They had numerous questions and are  appropriately very inquisitive. I discussed theCOMET trial in detail and she is not interested in pursuing that. We talked about lumpectomy with radioactive seed and mastectomy. I told her there was no survival advantage to mastectomy. At this time she seems interested in lumpectomy. She asked that I refer her to medical oncology and radiation oncology and we are going to do that. She asked that I go ahead and schedule her surgery as well She'll be scheduled for left breast lumpectomy with radioactive seed localization. I discussed the indications, details, techniques, and numerous risk of the surgery with her. She is aware of the risk of bleeding, infection, reoperation for positive margins, cosmetic deformity. Nerve damage chronic pain. She understands all of these issues and all of her questions are answered. She agrees with this plan.  I told her she would need to discontinue her Coumadin and a full 5 days prior to surgery and the Dr. Cyndie Chime would need to approve this and manage the Lovenox bridge.    Past Surgical History  Appendectomy  Breast Biopsy  Left. Cataract Surgery  Bilateral. Foot Surgery  Left. Gallbladder Surgery - Open  Hip Surgery  Right. Hysterectomy (not due to cancer) - Complete  Knee Surgery  Left. Shoulder Surgery  Right. Spinal Surgery - Lower Back  Spinal Surgery - Neck   Diagnostic Studies History  Colonoscopy  5-10 years ago Mammogram  within last year Pap Smear  1-5 years ago  Allergies  Iodine *ANTISEPTICS & DISINFECTANTS*  Amoxicillin *PENICILLINS*  Sulfur *CHEMICALS*  Erythromycin *MACROLIDES*  Codeine Phosphate *ANALGESICS - OPIOID*  Benadryl Allergy *ANTIHISTAMINES*  Aspirin 81 *ANALGESICS - NonNarcotic*  Demerol *ANALGESICS - OPIOID*  Allergies Reconciled   Medication History ( Losartan Potassium (100MG  Tablet, Oral) Active. valACYclovir HCl (1GM Tablet, Oral) Active. Warfarin Sodium (7.5MG  Tablet,  Oral) Active. Divalproex Sodium (250MG  Tablet DR, Oral) Active. Zolpidem Tartrate (10MG  Tablet, Oral) Active. Medications Reconciled  Social History  Alcohol use  Remotely quit alcohol use. Caffeine use  Carbonated beverages, Coffee. No drug use  Tobacco use  Never smoker.  Family History  Breast Cancer  Sister. Heart Disease  Brother. Prostate Cancer  Brother.  Pregnancy / Birth History  Age at menarche  16 years. Age of menopause  41-60 Gravida  3 Maternal age  36-25 Para  2  Other Problems  Back Pain  Cancer  Diverticulosis  High blood pressure  Seizure Disorder  Vascular Disease     Review of Systems General Present- Appetite Loss and Fatigue. Not Present- Chills, Fever, Night Sweats, Weight Gain and Weight Loss. Skin Present- Dryness. Not Present- Change in Wart/Mole, Hives, Jaundice, New Lesions, Non-Healing Wounds, Rash and Ulcer. HEENT Present- Hearing Loss and Wears glasses/contact lenses. Not Present- Earache, Hoarseness, Nose Bleed, Oral Ulcers, Ringing in the Ears, Seasonal Allergies, Sinus Pain, Sore Throat, Visual Disturbances and Yellow Eyes. Respiratory Present- Snoring. Not Present- Bloody sputum, Chronic Cough, Difficulty Breathing and Wheezing. Breast Present- Breast Mass. Not Present- Breast Pain, Nipple Discharge and Skin Changes. Cardiovascular Present- Leg Cramps. Not Present- Chest Pain, Difficulty Breathing Lying Down, Palpitations, Rapid Heart Rate, Shortness of Breath and Swelling of Extremities. Gastrointestinal Present- Chronic diarrhea and Nausea. Not Present- Abdominal Pain, Bloating, Bloody Stool, Change in Bowel Habits, Constipation, Difficulty Swallowing, Excessive gas, Gets full quickly at meals, Hemorrhoids, Indigestion, Rectal Pain and Vomiting. Female Genitourinary Not Present- Frequency, Nocturia, Painful Urination, Pelvic Pain and Urgency. Musculoskeletal Present- Back Pain. Not Present- Joint Pain, Joint  Stiffness, Muscle Pain, Muscle Weakness and Swelling of Extremities. Neurological Present- Seizures, Tingling and Weakness. Not Present- Decreased Memory, Fainting, Headaches, Numbness, Tremor and Trouble walking. Endocrine Present- Cold Intolerance. Not Present- Excessive Hunger, Hair Changes, Heat Intolerance, Hot flashes and New Diabetes. Hematology Present- Blood Thinners and Easy Bruising. Not Present- Excessive bleeding, Gland problems, HIV and Persistent Infections.  Vitals  Weight: 134 lb Height: 64in Body Surface Area: 1.65 m Body Mass Index: 23 kg/m  Temp.: 98.39F  Pulse: 82 (Regular)  BP: 118/88 (Sitting, Left Arm, Standard)     Physical Exam General Mental Status-Alert. General Appearance-Consistent with stated age. Hydration-Well hydrated. Voice-Normal.  Head and Neck Head-normocephalic, atraumatic with no lesions or palpable masses. Trachea-midline. Thyroid Gland Characteristics - normal size and consistency.  Eye Eyeball - Bilateral-Extraocular movements intact. Sclera/Conjunctiva - Bilateral-No scleral icterus.  Chest and Lung Exam Chest and lung exam reveals -quiet, even and easy respiratory effort with no use of accessory muscles and on auscultation, normal breath sounds, no adventitious sounds and normal vocal resonance. Inspection Chest Wall - Normal. Back - normal.  Breast Note: Breasts are small to medium size. Somewhat atrophic. Ecchymoses in the inferior pole of the left breast but no mass. No other skin changes or masses on either side. No palpable axillary adenopathy.   Cardiovascular Note: Regular rate and rhythm. Grade 2-3 systolic murmur left lower sternal border. No ectopy. Radial pulses palpable   Abdomen Inspection Inspection of the abdomen reveals - No Hernias. Skin - Scar - Note: Healed right subcostal scar. Palpation/Percussion Palpation and Percussion of the abdomen reveal - Soft, Non Tender, No  Rebound tenderness, No Rigidity (guarding) and No hepatosplenomegaly. Auscultation Auscultation of the  abdomen reveals - Bowel sounds normal.  Neurologic Neurologic evaluation reveals -alert and oriented x 3 with no impairment of recent or remote memory. Mental Status-Normal.  Musculoskeletal Normal Exam - Left-Upper Extremity Strength Normal and Lower Extremity Strength Normal. Normal Exam - Right-Upper Extremity Strength Normal and Lower Extremity Strength Normal.  Lymphatic Head & Neck  General Head & Neck Lymphatics: Bilateral - Description - Normal. Axillary  General Axillary Region: Bilateral - Description - Normal. Tenderness - Non Tender. Femoral & Inguinal  Generalized Femoral & Inguinal Lymphatics: Bilateral - Description - Normal. Tenderness - Non Tender.    Assessment & Plan  DUCTAL CARCINOMA IN SITU (DCIS) OF LEFT BREAST (D05.12   your recent screening mammogram show a 7 mm cluster of calcifications in the left breast, lower inner quadrant Image guided biopsy shows low-grade ductal carcinoma in situ, estrogen receptor strongly positive, progesterone receptor weakly positive  We discussed standard approach, which in my opinion would be left lumpectomy followed by either radiation therapy or antiestrogen therapy but probably not both. We discussed a research protocol called the COMET trial, but you do not seem interested in pursuing that any further  After lengthy discussion with you and your husband, you requested that we refer you to medical oncology and radiation oncology now, but that we simultaneously go ahead with scheduling of your left breast lumpectomy with radioactive seed localization That is what we will do  I discussed the indications, details, techniques, and numerous risks of the surgery with you and your husband If you have further questions I will be happy to discuss this with you by phone or you may return to the office for another preop  counseling session.    ANTICOAGULATED ON COUMADIN (Z79.01)  SQUAMOUS CELL CARCINOMA OF SKIN OF LEFT LOWER EXTREMITY (C44.729) HISTORY OF HIP REPLACEMENT, TOTAL, RIGHT (Z96.641) HISTORY OF CHOLECYSTECTOMY (Z90.49) H/O: HYSTERECTOMY (Z90.710) HISTORY OF LUMBAR LAMINECTOMY (Z98.890) CONGENITAL ABSENCE OF ONE KIDNEY (Q60.0) ARM DVT (DEEP VENOUS THROMBOEMBOLISM), CHRONIC, RIGHT (I82.721)    Nerida Boivin M. Derrell Lolling, M.D., [facility] General and Minimally invasive Surgery Breast and Colorectal Surgery Office:   [phone] Pager:   [phone]

## 2018-08-02 ENCOUNTER — Ambulatory Visit (INDEPENDENT_AMBULATORY_CARE_PROVIDER_SITE_OTHER): Payer: 59 | Admitting: Pharmacist

## 2018-08-02 ENCOUNTER — Encounter: Payer: Self-pay | Admitting: Pharmacist

## 2018-08-02 DIAGNOSIS — D6859 Other primary thrombophilia: Secondary | ICD-10-CM | POA: Diagnosis not present

## 2018-08-02 DIAGNOSIS — Z7901 Long term (current) use of anticoagulants: Secondary | ICD-10-CM | POA: Diagnosis not present

## 2018-08-02 DIAGNOSIS — Z86718 Personal history of other venous thrombosis and embolism: Secondary | ICD-10-CM | POA: Diagnosis not present

## 2018-08-02 DIAGNOSIS — Z5181 Encounter for therapeutic drug level monitoring: Secondary | ICD-10-CM | POA: Diagnosis not present

## 2018-08-02 DIAGNOSIS — I742 Embolism and thrombosis of arteries of the upper extremities: Secondary | ICD-10-CM | POA: Diagnosis not present

## 2018-08-02 LAB — POCT INR: INR: 2.2 (ref 2.0–3.0)

## 2018-08-02 MED ORDER — ENOXAPARIN SODIUM 100 MG/ML ~~LOC~~ SOLN: 90.0000 mg | SUBCUTANEOUS | 1 refills | Status: DC

## 2018-08-02 NOTE — Progress Notes (Signed)
Location of Breast Cancer: Malignant neoplasm of LIQ, left breast, ER +  Did patient present with symptoms (if so, please note symptoms) or was this found on screening mammography?: Screening mammogram.  Screening mammogram: 7 mm cluster of calcifications in the left breast, lower inner quadrant.  Histology per Pathology Report: Left breast 07/09/2018   Receptor Status: ER(+ 100%), PR (+ 10%), Her2-neu (), Ki-()  Past/Anticipated interventions by surgeon, if any: Dr. Dalbert Batman 07/15/2018 -We talked about lumpectomy with radioactive seed and mastectomy. -At this time she seems interested in lumpectomy. - She asked that I refer her to medical and radiation oncology and we are going to do that. -She asked that I go ahead and schedule her surgery as well, she'll be scheduled for left breast lumpectomy with radioactive seed localization. -I told her she would need to discontinue her coumadin for a full 5 days prior to surgery and Dr. Beryle Beams would need to approve this and manage her lovenox bridge. -Surgery scheduled 08/06/2018   Past/Anticipated interventions by medical oncology, if any: Chemotherapy  Dr. Jana Hakim 08/03/2018 4 pm  Lymphedema issues, if any: No, just leg swelling with dangling of feet.  Pain issues, if any:  No  BP (!) 175/90 (BP Location: Left Arm, Patient Position: Sitting)   Pulse 86   Temp 98 F (36.7 C) (Oral)   Resp 20   Ht 5' 4"  (1.626 m)   Wt 132 lb 9.6 oz (60.1 kg)   SpO2 98%   BMI 22.76 kg/m    Wt Readings from Last 3 Encounters:  08/03/18 132 lb 9.6 oz (60.1 kg)  07/07/18 133 lb 3.2 oz (60.4 kg)  05/19/18 133 lb (60.3 kg)    SAFETY ISSUES:  Prior radiation? No  Pacemaker/ICD? No  Possible current pregnancy? Abdominal hysterectomy  Is the patient on methotrexate?  No  Current Complaints / other details:   -On chronic coumadin for venous thrombosis syndrome. -Only has one kidney - hx seizures     Cori Razor, RN 08/02/2018,1:49  PM

## 2018-08-02 NOTE — Patient Instructions (Signed)
Patient instructed to take medications as defined in the Anti-coagulation Track section of this encounter.  Patient instructed to take today's dose--as LAST DOSE for this week, prior to surgery on Friday 06-Aug-2018.  Patient was instructed to POST-OPERATIVELY--Take 1 & 1/2 tablets of your 5mg  peach-colored warfarin tablets by mouth, once-daily at 6PM commencing the evening of your surgery (Friday, February 21st at 6PM--take warfarfin). Take one-and one-half (1 & 1/2) tablets of your 5mg  peach-colored warfarin tablets on Saturday and Sunday. Return to anticoagulation management clinic on Monday, February 24th at 11:30AM. Commence enoxaparin/Lovenox 90mg  to be administered at level of waist-band, 2 inches removed or away from your belly button. Inject yourself once daily at approximately 12:30PM, starting on Saturday, August 07, 2018.  Patient verbalized understanding of these instructions.

## 2018-08-02 NOTE — Progress Notes (Signed)
Anticoagulation Management Tracy Peters is a 85 y.o. female who reports to the clinic for monitoring of warfarin treatment.    Indication: Chronic anticoagulation, History of DVT, History of thrombosis of right radial artery; Primary hypercoagulable state.   Duration: indefinite Supervising physician: Murriel Hopper  Anticoagulation Clinic Visit History: Patient does not report signs/symptoms of bleeding or thromboembolism  Other recent changes: No diet, medications, lifestyle changes other than as noted in patient findings.  Anticoagulation Episode Summary    Current INR goal:   2.0-3.0  TTR:   70.7 % (7.1 y)  Next INR check:   08/09/2018  INR from last check:   2.2 (08/02/2018)  Weekly max warfarin dose:     Target end date:   Indefinite  INR check location:   Anticoagulation Clinic  Preferred lab:     Send INR reminders to:   ANTICOAG IMP   Indications   Chronic anticoagulation [Z79.01] DVT HX OF [Z86.718] 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HCC) [I74.2] Primary hypercoagulable state ([place]) [D68.59] [D68.59] Long term (current) use of anticoagulants [Z79.01] [Z79.01]       Comments:         Anticoagulation Care Providers    Provider Role Specialty Phone number   Annia Belt, MD Referring Oncology [phone]      Allergies  Allergen Reactions  . Bee Venom Anaphylaxis and Other (See Comments)    Yellow jackets, wasps as well  . Ivp Dye [Iodinated Diagnostic Agents] Anaphylaxis  . Shellfish Allergy Anaphylaxis  . Temazepam Other (See Comments)    Severe Hallucinations and Paranoia  . Amlodipine Besylate Swelling  . Aspirin Other (See Comments)    REACTION: break out in sweats  . Atorvastatin Diarrhea  . Cholestyramine Other (See Comments)    REACTION: mouth irritation  . Codeine Phosphate Nausea And Vomiting  . Demerol [Meperidine] Other (See Comments)    Severe Hallucination!!!!  . Diphenhydramine Hcl Other (See Comments)    hyperactive   . Doxycycline Nausea Only  . Gentamicin Sulfate Other (See Comments)    REACTION: Shortness of breath   . Keppra [Levetiracetam]     Made her too groggy/ sleepy  . Meclizine Hcl Other (See Comments)    REACTION: more dizziness on it  . Meperidine Hcl Other (See Comments)    Unknown  . Metronidazole Other (See Comments)    REACTION: bad taste  . Moxifloxacin Other (See Comments)    REACTION: insomnia Able to use Cipro  . Sulfamethoxazole Other (See Comments)    Kidney problems  . Penicillins Swelling, Rash and Other (See Comments)    Has patient had a PCN reaction causing immediate rash, facial/tongue/throat swelling, SOB or lightheadedness with hypotension: Yes Has patient had a PCN reaction causing severe rash involving mucus membranes or skin necrosis: No Has patient had a PCN reaction that required hospitalization: No Has patient had a PCN reaction occurring within the last 10 years: No If all of the above answers are "NO", then may proceed with Cephalosporin use.    Prior to Admission medications   Medication Sig Start Date End Date Taking? Authorizing Provider  colesevelam (WELCHOL) 625 MG tablet Take 2 tablets (1,250 mg total) by mouth 2 (two) times daily with a meal. Patient taking differently: Take 1,250 mg by mouth daily with breakfast.  04/20/18 04/20/19 Yes Plotnikov, Evie Lacks, MD  diphenoxylate-atropine (LOMOTIL) 2.5-0.025 MG tablet Take 1-2 tablets by mouth 4 (four) times daily as needed for diarrhea or loose stools. 05/19/18  Yes  Plotnikov, Evie Lacks, MD  EPINEPHrine 0.3 mg/0.3 mL IJ SOAJ injection As dirrected 01/27/17  Yes Plotnikov, Evie Lacks, MD  ferrous sulfate 325 (65 FE) MG tablet Take 1 tablet (325 mg total) by mouth 3 (three) times daily after meals. Patient taking differently: Take 325 mg by mouth every other day.  06/18/17  Yes Babish, Rodman Key, PA-C  fluticasone (FLONASE) 50 MCG/ACT nasal spray Place 2 sprays into both nostrils daily. Patient taking  differently: Place 2 sprays into both nostrils daily as needed for allergies or rhinitis.  07/29/17  Yes Plotnikov, Evie Lacks, MD  lacosamide (VIMPAT) 50 MG TABS tablet Take 1 tablet (50 mg total) by mouth 2 (two) times daily. 07/07/18  Yes Penumalli, Earlean Polka, MD  losartan (COZAAR) 100 MG tablet Take 0.5 tablets (50 mg total) by mouth daily. 01/12/18  Yes Plotnikov, Evie Lacks, MD  mupirocin ointment (BACTROBAN) 2 % Apply topically daily. Cleanse wound to right lower leg with NS.  Apply mupirocin ointment to wound.  Cover with 4x4 gauze and wrap with kerlix/tape.  Change daily. 10/23/17  Yes Rai, Ripudeep K, MD  Probiotic Product (ALIGN PO) Take by mouth. One cap by mouth Bid.   Yes [provider]  triamcinolone ointment (KENALOG) 0.1 % Apply 1 application topically 3 (three) times daily. 04/20/18  Yes Plotnikov, Evie Lacks, MD  valACYclovir (VALTREX) 1000 MG tablet TAKE 1 TABLET BY MOUTH TWICE DAILY. 02/07/18  Yes Plotnikov, Evie Lacks, MD  warfarin (COUMADIN) 5 MG tablet Take 1 and 1/2 tablets on Sundays, Tuesdays, Thursdays and Saturdays. All other days, take only one (1) tablet. 07/12/18  Yes Pennie Banter, RPH-CPP  zolpidem (AMBIEN) 10 MG tablet 1 & 1/2 TABLETS (15MG ) BY MOUTH AT BEDTIME AS NEEDED FOR SLEEP, OK TOREPEAT IN 4HRS 03/05/18  Yes Plotnikov, Evie Lacks, MD   Past Medical History:  Diagnosis Date  . Adenomatous colon polyp   . Allergic rhinitis    uses FLonase nightly  . Anemia   . Arthritis    joint pain  . Blood transfusion   . Bronchitis    hx of-7-27yrs ago  . Carpal tunnel syndrome    numbness and tingling   . Cataracts, bilateral   . Chronic anticoagulation 03/21/2013  . Congenital absence of one kidney    pt only has one kidney  . Diarrhea    takes Immodium daily as needed or Lomotil   . Diverticulosis   . GERD (gastroesophageal reflux disease)    mild  . Herpes    takes Valtrex daily  . History of colon polyps   . History of DVT (deep vein thrombosis) 1998    right arm  . History of staph infection   . Hyperlipidemia    Medical MD doesn't think its absolutely necessary  . Hypertension    takes Losartan daily  . IBS (irritable bowel syndrome)    takes Electronics engineer daily  . Insomnia    takes Ambien nightly as needed  . LBP (low back pain)   . Long term (current) use of anticoagulants    Dr. Beryle Beams  . Nocturia   . Osteoporosis   . Raynaud's disease   . 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 Winfield Cunas  . Seizures ([place]) 10/20/2017   has not had a seizure since may 2019  . 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 Urinary leakage   . UTI (urinary tract infection)  Social History   Socioeconomic History  . Marital status: Married    Spouse name: Not on file  . Number of children: 2  . Years of education: Not on file  . Highest education level: Not on file  Occupational History  . Occupation: Retired    Fish farm manager: RETIRED  Social Needs  . Financial resource strain: Not on file  . Food insecurity:    Worry: Not on file    Inability: Not on file  . Transportation needs:    Medical: Not on file    Non-medical: Not on file  Tobacco Use  . Smoking status: Never Smoker  . Smokeless tobacco: Never Used  Substance and Sexual Activity  . Alcohol use: No    Alcohol/week: 0.0 standard drinks  . Drug use: No  . Sexual activity: Never  Lifestyle  . Physical activity:    Days per week: Not on file    Minutes per session: Not on file  . Stress: Not on file  Relationships  . Social connections:    Talks on phone: Not on file    Gets together: Not on file    Attends religious service: Not on file    Active member of club or organization: Not on file    Attends meetings of clubs or organizations: Not on file    Relationship status: Not on file  Other Topics Concern  . Not on file  Social History Narrative   Lives home with husband.  Education- graduate level.  Children 2.      Family History  Problem Relation Age of Onset  . Breast cancer Sister   . Kidney disease Sister 1       nephritis  . Parkinsonism Mother   . Heart disease Brother   . Osteoarthritis Other   . Ulcerative colitis Sister   . Kidney disease Sister   . Colon cancer Neg Hx     ASSESSMENT Recent Results: The most recent result is correlated with 45 mg per week: Lab Results  Component Value Date   INR 2.2 08/02/2018   INR 2.6 07/12/2018   INR 2.5 05/31/2018   PROTIME 39.6 (H) 04/03/2015    Anticoagulation Dosing: Description   Take 1 & 1/2 tablets of your 5mg  peach-colored warfarin tablets by mouth, once-daily at 6PM commencing the evening of your surgery (Friday, February 21st at 6PM--take warfarfin). Take one-and one-half (1 & 1/2) tablets of your 5mg  peach-colored warfarin tablets on Saturday and Sunday. Return to anticoagulation management clinic on Monday, February 24th at 11:30AM. Commence enoxaparin/Lovenox 90mg  to be administered at level of waist-band, 2 inches removed or away from your belly button. Inject yourself once daily at approximately 12:30PM, starting on Saturday, August 07, 2018.   DR Azucena Freed PATIENT (route warfarin notes and list as authorizing provider for LOS & Follow-up, lab orders and warfarin prescriptions), do not list attending physician      INR today: Therapeutic  PLAN Weekly dose was HELD after tonight's dose. Patient will be bridged POST-OP (see instructions above). Patient will RESUME warfarin at 7.5mg  per day commencing evening of surgery along with her 1.5mg /kg SQ q24h enoxaparin/Lovenox Bridge Therapy.   Patient Instructions  Patient instructed to take medications as defined in the Anti-coagulation Track section of this encounter.  Patient instructed to take today's dose--as LAST DOSE for this week, prior to surgery on Friday 06-Aug-2018.  Patient was instructed to POST-OPERATIVELY--Take 1 & 1/2 tablets of your 5mg  peach-colored  warfarin tablets by mouth, once-daily  at [facility] commencing the evening of your surgery (Friday, February 21st at 6PM--take warfarfin). Take one-and one-half (1 & 1/2) tablets of your 5mg  peach-colored warfarin tablets on Saturday and Sunday. Return to anticoagulation management clinic on Monday, February 24th at 11:30AM. Commence enoxaparin/Lovenox 90mg  to be administered at level of waist-band, 2 inches removed or away from your belly button. Inject yourself once daily at approximately 12:30PM, starting on Saturday, August 07, 2018.  Patient verbalized understanding of these instructions.     Patient advised to contact clinic or seek medical attention if signs/symptoms of bleeding or thromboembolism occur.  Patient verbalized understanding by repeating back information and was advised to contact me if further medication-related questions arise. Patient was also provided an information handout.  Follow-up Return in 1 week (on 08/09/2018) for Follow up INR  at 11:30AM.  Pennie Banter, PharmD, CPP  15 minutes spent face-to-face with the patient during the encounter. 50% of time spent on education, including signs/sx bleeding and clotting, as well as food and drug interactions with warfarin. 50% of time was spent on fingerprick POC INR sample collection,processing, results determination, and documentation in [email or web address].[email or web address]

## 2018-08-02 NOTE — Progress Notes (Signed)
Reviewed Thanks DrG 

## 2018-08-03 ENCOUNTER — Inpatient Hospital Stay: Payer: 59 | Attending: Oncology | Admitting: Oncology

## 2018-08-03 ENCOUNTER — Other Ambulatory Visit: Payer: Self-pay

## 2018-08-03 ENCOUNTER — Ambulatory Visit
Admission: RE | Admit: 2018-08-03 | Discharge: 2018-08-03 | Disposition: A | Discharge status: Home / Self Care | Payer: 59 | Source: Ambulatory Visit | Attending: Radiation Oncology | Admitting: Radiation Oncology

## 2018-08-03 ENCOUNTER — Encounter: Payer: Self-pay | Admitting: Radiation Oncology

## 2018-08-03 ENCOUNTER — Encounter: Payer: Self-pay | Admitting: Oncology

## 2018-08-03 VITALS — BP 175/90 | HR 86 | Temp 98.0°F | Resp 20 | Ht 64.0 in | Wt 132.6 lb

## 2018-08-03 DIAGNOSIS — Z803 Family history of malignant neoplasm of breast: Secondary | ICD-10-CM | POA: Insufficient documentation

## 2018-08-03 DIAGNOSIS — C50312 Malignant neoplasm of lower-inner quadrant of left female breast: Secondary | ICD-10-CM

## 2018-08-03 DIAGNOSIS — Z87440 Personal history of urinary (tract) infections: Secondary | ICD-10-CM | POA: Diagnosis not present

## 2018-08-03 DIAGNOSIS — Z7901 Long term (current) use of anticoagulants: Secondary | ICD-10-CM | POA: Diagnosis not present

## 2018-08-03 DIAGNOSIS — G4700 Insomnia, unspecified: Secondary | ICD-10-CM | POA: Insufficient documentation

## 2018-08-03 DIAGNOSIS — I7300 Raynaud's syndrome without gangrene: Secondary | ICD-10-CM | POA: Insufficient documentation

## 2018-08-03 DIAGNOSIS — Z79899 Other long term (current) drug therapy: Secondary | ICD-10-CM | POA: Diagnosis not present

## 2018-08-03 DIAGNOSIS — K219 Gastro-esophageal reflux disease without esophagitis: Secondary | ICD-10-CM | POA: Diagnosis not present

## 2018-08-03 DIAGNOSIS — E785 Hyperlipidemia, unspecified: Secondary | ICD-10-CM | POA: Diagnosis not present

## 2018-08-03 DIAGNOSIS — D0512 Intraductal carcinoma in situ of left breast: Secondary | ICD-10-CM | POA: Diagnosis not present

## 2018-08-03 DIAGNOSIS — Z800 Family history of malignant neoplasm of digestive organs: Secondary | ICD-10-CM | POA: Diagnosis not present

## 2018-08-03 DIAGNOSIS — Z170 Estrogen receptor positive status [ER+]: Secondary | ICD-10-CM

## 2018-08-03 DIAGNOSIS — Z86010 Personal history of colonic polyps: Secondary | ICD-10-CM | POA: Insufficient documentation

## 2018-08-03 DIAGNOSIS — I10 Essential (primary) hypertension: Secondary | ICD-10-CM

## 2018-08-03 DIAGNOSIS — R197 Diarrhea, unspecified: Secondary | ICD-10-CM | POA: Insufficient documentation

## 2018-08-03 DIAGNOSIS — K589 Irritable bowel syndrome without diarrhea: Secondary | ICD-10-CM | POA: Insufficient documentation

## 2018-08-03 DIAGNOSIS — Z86718 Personal history of other venous thrombosis and embolism: Secondary | ICD-10-CM | POA: Insufficient documentation

## 2018-08-03 DIAGNOSIS — M1990 Unspecified osteoarthritis, unspecified site: Secondary | ICD-10-CM | POA: Insufficient documentation

## 2018-08-03 DIAGNOSIS — M810 Age-related osteoporosis without current pathological fracture: Secondary | ICD-10-CM | POA: Diagnosis not present

## 2018-08-03 DIAGNOSIS — M129 Arthropathy, unspecified: Secondary | ICD-10-CM | POA: Diagnosis not present

## 2018-08-03 NOTE — Progress Notes (Signed)
Radiation Oncology         [phone] ________________________________  Name: Tracy Peters        MRN: [id number]  Date of Service: 08/03/2018 DOB: 12/22/1933  [id number], Evie Lacks, MD  Fanny Skates, MD     REFERRING PHYSICIAN: Fanny Skates, MD   DIAGNOSIS: The primary encounter diagnosis was Malignant neoplasm of lower-inner quadrant of left breast in female, estrogen receptor positive ([place]). A diagnosis of Ductal carcinoma in situ (DCIS) of left breast was also pertinent to this visit.   HISTORY OF PRESENT ILLNESS: Tracy Peters is a 85 y.o. female for a new diagnosis of left breast cancer. The patient was noted to have screening detected calcifications of the left breast. Diagnostic imaging revealed a 7 mm group of amorphous calcifications in the LIQ of the left breast. She underwent  tomo guided biopsy on 07/09/17 that revealed a low grade DCIS that was ER/PR positive. She is scheduled to undergo lumpectomy on Friday of this week. She comes today to discuss options of treatment of her cancer. She is scheduled to meet with Dr. Jana Hakim this afternoon.   PREVIOUS RADIATION THERAPY: No   PAST MEDICAL HISTORY:  Past Medical History:  Diagnosis Date  . Adenomatous colon polyp   . Allergic rhinitis    uses FLonase nightly  . Anemia   . Arthritis    joint pain  . Blood transfusion   . Bronchitis    hx of-7-63yrs ago  . Carpal tunnel syndrome    numbness and tingling   . Cataracts, bilateral   . Chronic anticoagulation 03/21/2013  . Congenital absence of one kidney    pt only has one kidney  . Diarrhea    takes Immodium daily as needed or Lomotil   . Diverticulosis   . GERD (gastroesophageal reflux disease)    mild  . Herpes    takes Valtrex daily  . History of colon polyps   . History of DVT (deep vein thrombosis) 1998   right arm  . History of staph infection   . Hyperlipidemia    Medical MD doesn't think its absolutely necessary  .  Hypertension    takes Losartan daily  . IBS (irritable bowel syndrome)    takes Electronics engineer daily  . Insomnia    takes Ambien nightly as needed  . LBP (low back pain)   . Long term (current) use of anticoagulants    Dr. Beryle Beams  . Nocturia   . Osteoporosis   . Raynaud's disease   . 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 Winfield Cunas  . Seizures ([place]) 10/20/2017   has not had a seizure since may 2019  . 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 Urinary leakage   . UTI (urinary tract infection)        PAST SURGICAL HISTORY: Past Surgical History:  Procedure Laterality Date  .  kidney disease left     . ABDOMINAL HYSTERECTOMY    . ANTERIOR CERVICAL DECOMP/DISCECTOMY FUSION N/A 01/18/2014   Procedure: ANTERIOR CERVICAL DECOMPRESSION/DISCECTOMY FUSION 3 LEVELS  Cervical  four/five, five/six, six/seven anterior cervical decompression with fusion interbody prosthesis with plating and bonegraft;  Surgeon: Newman Pies, MD;  Location: [facility];  Service: Neurosurgery;  Laterality: N/A;  t  . APPENDECTOMY    . BACK SURGERY     2  . BREAST BIOPSY Left 30+ yrs ago   benign  .  BREAST SURGERY     cystectomy-benign  . CARPAL TUNNEL RELEASE Right 06/12/2014   Procedure: CARPAL TUNNEL RELEASE;  Surgeon: Newman Pies, MD;  Location: [facility];  Service: Neurosurgery;  Laterality: Right;  Right Carpal Tunnel Release  . CATARACT EXTRACTION Bilateral 02/15/2015  . CHOLECYSTECTOMY    . COLONOSCOPY    . [place]  . HIP CLOSED REDUCTION Right 08/23/2017   Procedure: CLOSED MANIPULATION HIP;  Surgeon: Nicholes Stairs, MD;  Location: [facility];  Service: Orthopedics;  Laterality: Right;  . I&D of abdomen  1988   couple of wks after gallbladder removed  . KNEE SURGERY     left x 3  . LUMBAR LAMINECTOMY     X 2  . mortons neuromas removed    . OPEN SURGICAL REPAIR OF GLUTEAL TENDON Right 06/15/2017    Procedure: OPEN SURGICAL REPAIR OF GLUTEALmedius TENDON;  Surgeon: Paralee Cancel, MD;  Location: [facility];  Service: Orthopedics;  Laterality: Right;  . SHOULDER SURGERY     Right  . TOTAL HIP ARTHROPLASTY Right 06/15/2017   Procedure: Right total hip arthroplasty, bursectomy, repair of gluteal tendon, posterior approach;  Surgeon: Paralee Cancel, MD;  Location: [facility];  Service: Orthopedics;  Laterality: Right;  90 mins for all procedures together     FAMILY HISTORY:  Family History  Problem Relation Age of Onset  . Breast cancer Sister   . Kidney disease Sister 55       nephritis  . Parkinsonism Mother   . Heart disease Brother   . Osteoarthritis Other   . Ulcerative colitis Sister   . Kidney disease Sister   . Colon cancer Neg Hx      SOCIAL HISTORY:  reports that she has never smoked. She has never used smokeless tobacco. She reports that she does not drink alcohol or use drugs. The patient is married and lives in [place].   ALLERGIES: Bee venom; Ivp dye [iodinated diagnostic agents]; Shellfish allergy; Temazepam; Amlodipine besylate; Aspirin; Atorvastatin; Cholestyramine; Codeine phosphate; Demerol [meperidine]; Diphenhydramine hcl; Doxycycline; Gentamicin sulfate; Keppra [levetiracetam]; Meclizine hcl; Meperidine hcl; Metronidazole; Moxifloxacin; Sulfamethoxazole; and Penicillins   MEDICATIONS:  Current Outpatient Medications  Medication Sig Dispense Refill  . diphenoxylate-atropine (LOMOTIL) 2.5-0.025 MG tablet Take 1-2 tablets by mouth 4 (four) times daily as needed for diarrhea or loose stools. 120 tablet 2  . EPINEPHrine 0.3 mg/0.3 mL IJ SOAJ injection As dirrected 1 Device 3  . ferrous sulfate 325 (65 FE) MG tablet Take 1 tablet (325 mg total) by mouth 3 (three) times daily after meals. (Patient taking differently: Take 325 mg by mouth every other day. )  3  . fluticasone (FLONASE) 50 MCG/ACT nasal spray Place 2 sprays into both nostrils daily. (Patient taking  differently: Place 2 sprays into both nostrils daily as needed for allergies or rhinitis. ) 48 g 3  . lacosamide (VIMPAT) 50 MG TABS tablet Take 1 tablet (50 mg total) by mouth 2 (two) times daily. 60 tablet 5  . losartan (COZAAR) 100 MG tablet Take 0.5 tablets (50 mg total) by mouth daily. 90 tablet 3  . Probiotic Product (ALIGN PO) Take by mouth. One cap by mouth Bid.    . valACYclovir (VALTREX) 1000 MG tablet TAKE 1 TABLET BY MOUTH TWICE DAILY. 180 tablet 1  . warfarin (COUMADIN) 5 MG tablet Take 1 and 1/2 tablets on Sundays, Tuesdays, Thursdays and Saturdays. All other days, take only one (1) tablet. 108 tablet 1  . zolpidem (  AMBIEN) 10 MG tablet 1 & 1/2 TABLETS (15MG ) BY MOUTH AT BEDTIME AS NEEDED FOR SLEEP, OK TOREPEAT IN 4HRS 135 tablet 1  . colesevelam (WELCHOL) 625 MG tablet Take 2 tablets (1,250 mg total) by mouth 2 (two) times daily with a meal. (Patient not taking: Reported on 08/03/2018) 120 tablet 11  . enoxaparin (LOVENOX) 100 MG/ML injection Inject 0.9 mLs (90 mg total) into the skin daily. (Patient not taking: Reported on 08/03/2018) 10 Syringe 1   No current facility-administered medications for this encounter.      REVIEW OF SYSTEMS: On review of systems, the patient reports that she is doing well overall. She reports she continues to have hip pain and reports this is chronic from complication of hip replacement. She denies any chest pain, shortness of breath, cough, fevers, chills, night sweats, unintended weight changes. She denies any bowel or bladder disturbances, and denies abdominal pain, nausea or vomiting. She denies any new musculoskeletal or joint aches or pains. A complete review of systems is obtained and is otherwise negative.     PHYSICAL EXAM:  Wt Readings from Last 3 Encounters:  08/03/18 132 lb 9.6 oz (60.1 kg)  07/07/18 133 lb 3.2 oz (60.4 kg)  05/19/18 133 lb (60.3 kg)   Temp Readings from Last 3 Encounters:  08/03/18 98 F (36.7 C) (Oral)  05/19/18 98.1  F (36.7 C) (Oral)  04/20/18 98 F (36.7 C) (Oral)   BP Readings from Last 3 Encounters:  08/03/18 (!) 175/90  07/07/18 (!) 144/86  05/19/18 (!) 152/84   Pulse Readings from Last 3 Encounters:  08/03/18 86  07/07/18 86  05/19/18 83     In general this is a well appearing caucasian female in no acute distress. She is alert and oriented x4 and appropriate throughout the examination. HEENT reveals that the patient is normocephalic, atraumatic. EOMs are intact. Skin is intact without any evidence of gross lesions.Cardiopulmonary assessment is negative for acute distress and she exhibits normal effort. Breast exam is deferred.  ECOG = 0  0 - Asymptomatic (Fully active, able to carry on all predisease activities without restriction)  1 - Symptomatic but completely ambulatory (Restricted in physically strenuous activity but ambulatory and able to carry out work of a light or sedentary nature. For example, light housework, office work)  2 - Symptomatic, <50% in bed during the day (Ambulatory and capable of all self care but unable to carry out any work activities. Up and about more than 50% of waking hours)  3 - Symptomatic, >50% in bed, but not bedbound (Capable of only limited self-care, confined to bed or chair 50% or more of waking hours)  4 - Bedbound (Completely disabled. Cannot carry on any self-care. Totally confined to bed or chair)  5 - Death   Eustace Pen MM, Creech RH, Tormey DC, et al. [phone]). "Toxicity and response criteria of the [facility]". [place] Oncol. 5 (6): 649-55    LABORATORY DATA:  Lab Results  Component Value Date   WBC 7.1 05/19/2018   HGB 12.3 05/19/2018   HCT 36.2 05/19/2018   MCV 112.7 Repeated and verified X2. (H) 05/19/2018   PLT 257.0 05/19/2018   Lab Results  Component Value Date   NA 128 (L) 05/19/2018   K 4.6 05/19/2018   CL 94 (L) 05/19/2018   CO2 26 05/19/2018   Lab Results  Component Value Date   ALT 15  05/19/2018   AST 19 05/19/2018   ALKPHOS 46  05/19/2018   BILITOT 0.6 05/19/2018      RADIOGRAPHY: Mm Digital Diagnostic Unilat L  Result Date: 07/06/2018 CLINICAL DATA:  Patient recalled from screening for left breast calcifications. EXAM: DIGITAL DIAGNOSTIC LEFT MAMMOGRAM WITH CAD COMPARISON:  Previous exam(s). ACR Breast Density Category c: The breast tissue is heterogeneously dense, which may obscure small masses. FINDINGS: Magnification CC and true lateral views of the left breast were obtained. There is a 7 mm group of amorphous calcifications within the lower inner left breast posterior depth. Mammographic images were processed with CAD. IMPRESSION: Indeterminate left breast calcifications RECOMMENDATION: Stereotactic guided core needle biopsy indeterminate left breast calcifications. I have discussed the findings and recommendations with the patient. Results were also provided in writing at the conclusion of the visit. If applicable, a reminder letter will be sent to the patient regarding the next appointment. BI-RADS CATEGORY  4: Suspicious. Electronically Signed   By: Lovey Newcomer M.D.   On: 07/06/2018 16:36   Mm Clip Placement Left  Result Date: 07/09/2018 CLINICAL DATA:  85 year old female status post stereotactic biopsy of left breast calcifications. EXAM: DIAGNOSTIC LEFT MAMMOGRAM POST STEREOTACTIC BIOPSY COMPARISON:  Previous exam(s). FINDINGS: Mammographic images were obtained following stereotactic guided biopsy of the left breast. Coil shaped clip is identified in the lower inner quadrant at far posterior depth. This is in the appropriate location. IMPRESSION: Post biopsy clip in the expected location. Final Assessment: Post Procedure Mammograms for Marker Placement Electronically Signed   By: Kristopher Oppenheim M.D.   On: 07/09/2018 13:40   Mm Lt Breast Bx W Loc Dev 1st Lesion Image Bx Spec Stereo Guide  Addendum Date: 07/13/2018   ADDENDUM REPORT: 07/13/2018 06:31 ADDENDUM: Pathology  revealed LOW NUCLEAR GRADE DUCTAL CARCINOMA IN SITU WITH CALCIFICATIONS of the Left breast, lower inner quadrant, posterior. Ductal carcinoma in situ shows a papillary and micropapillary architecture. This was found to be concordant by Dr. Kristopher Oppenheim. Pathology results were discussed with the patient by telephone. The patient reported doing well after the biopsy with tenderness and bruising at the site. Post biopsy instructions and care were reviewed and questions were answered. The patient was encouraged to call [facility] for any additional concerns. Surgical consultation has been arranged with Dr. Fanny Skates at [facility] on July 16, 2018. Pathology results reported by Terie Purser, RN on 07/13/2018. Electronically Signed   By: Kristopher Oppenheim M.D.   On: 07/13/2018 06:31   Result Date: 07/13/2018 CLINICAL DATA:  85 year old female with indeterminate left breast calcifications. EXAM: LEFT BREAST STEREOTACTIC CORE NEEDLE BIOPSY COMPARISON:  Previous exams. FINDINGS: The patient and I discussed the procedure of stereotactic-guided biopsy including benefits and alternatives. We discussed the high likelihood of a successful procedure. We discussed the risks of the procedure including infection, bleeding, tissue injury, clip migration, and inadequate sampling. Informed written consent was given. The usual time out protocol was performed immediately prior to the procedure. Using sterile technique and 1% Lidocaine as local anesthetic, under stereotactic guidance, a 9 gauge vacuum assisted device was used to perform core needle biopsy of calcifications in the lower inner quadrant of the left breast using a lateral approach. Specimen radiograph was performed showing calcifications in several specimens. Specimens with calcifications are identified for pathology. Lesion quadrant: Lower inner quadrant At the conclusion of the procedure, a coil shaped tissue marker clip was  deployed into the biopsy cavity. Follow-up 2-view mammogram was performed and dictated separately. IMPRESSION: Stereotactic-guided biopsy of left breast calcifications.  No apparent complications. Electronically Signed: By: Kristopher Oppenheim M.D. On: 07/09/2018 13:40       IMPRESSION/PLAN: 1. Low grade ER positive DCIS of the left breast. Dr. Lisbeth Renshaw discusses the pathology findings and reviews the nature of left noninvasive breast disease. The patient is planning to proceed with breast conservation with lumpectomy this Friday. We discussed the standard of care would be to offer adjuvant radiotherapy, but that she also would meet criteria to consider forgoing radiation treatment given the low grade nature of her tumor, and her age.  The final determination will be following her surgical resection. We discussed the risks, benefits, short, and long term effects of radiotherapy. Dr. Lisbeth Renshaw discusses the delivery and logistics of radiotherapy and anticipates a course of 4 weeks of radiotherapy if she elects to move forward. She is in agreement with this plan.  In a visit lasting 60 minutes, greater than 50% of the time was spent face to face discussing her case, and coordinating the patient's care.  The above documentation reflects my direct findings during this shared patient visit. Please see the separate note by Dr. Lisbeth Renshaw on this date for the remainder of the patient's plan of care.    Carola Rhine, PAC

## 2018-08-05 ENCOUNTER — Ambulatory Visit
Admission: RE | Admit: 2018-08-05 | Discharge: 2018-08-05 | Disposition: A | Discharge status: Home / Self Care | Payer: 59 | Source: Ambulatory Visit | Attending: General Surgery | Admitting: General Surgery

## 2018-08-05 ENCOUNTER — Encounter (HOSPITAL_BASED_OUTPATIENT_CLINIC_OR_DEPARTMENT_OTHER)
Admission: RE | Admit: 2018-08-05 | Discharge: 2018-08-05 | Disposition: A | Discharge status: Home / Self Care | Payer: 59 | Source: Ambulatory Visit | Attending: General Surgery | Admitting: General Surgery

## 2018-08-05 DIAGNOSIS — I82721 Chronic embolism and thrombosis of deep veins of right upper extremity: Secondary | ICD-10-CM | POA: Diagnosis not present

## 2018-08-05 DIAGNOSIS — I10 Essential (primary) hypertension: Secondary | ICD-10-CM | POA: Diagnosis not present

## 2018-08-05 DIAGNOSIS — C44729 Squamous cell carcinoma of skin of left lower limb, including hip: Secondary | ICD-10-CM | POA: Diagnosis not present

## 2018-08-05 DIAGNOSIS — Z7901 Long term (current) use of anticoagulants: Secondary | ICD-10-CM | POA: Diagnosis not present

## 2018-08-05 DIAGNOSIS — Z79899 Other long term (current) drug therapy: Secondary | ICD-10-CM | POA: Diagnosis not present

## 2018-08-05 DIAGNOSIS — G8929 Other chronic pain: Secondary | ICD-10-CM | POA: Diagnosis not present

## 2018-08-05 DIAGNOSIS — D0512 Intraductal carcinoma in situ of left breast: Secondary | ICD-10-CM

## 2018-08-05 DIAGNOSIS — G40909 Epilepsy, unspecified, not intractable, without status epilepticus: Secondary | ICD-10-CM | POA: Diagnosis not present

## 2018-08-05 DIAGNOSIS — R928 Other abnormal and inconclusive findings on diagnostic imaging of breast: Secondary | ICD-10-CM | POA: Diagnosis not present

## 2018-08-05 DIAGNOSIS — Z803 Family history of malignant neoplasm of breast: Secondary | ICD-10-CM | POA: Diagnosis not present

## 2018-08-05 DIAGNOSIS — Z01812 Encounter for preprocedural laboratory examination: Secondary | ICD-10-CM

## 2018-08-05 DIAGNOSIS — Q600 Renal agenesis, unilateral: Secondary | ICD-10-CM | POA: Diagnosis not present

## 2018-08-05 DIAGNOSIS — Z170 Estrogen receptor positive status [ER+]: Secondary | ICD-10-CM | POA: Diagnosis not present

## 2018-08-05 DIAGNOSIS — K219 Gastro-esophageal reflux disease without esophagitis: Secondary | ICD-10-CM | POA: Diagnosis not present

## 2018-08-05 LAB — COMPREHENSIVE METABOLIC PANEL
ALBUMIN: 3.7 g/dL (ref 3.5–5.0)
ALT: 21 U/L (ref 0–44)
AST: 26 U/L (ref 15–41)
Alkaline Phosphatase: 46 U/L (ref 38–126)
Anion gap: 12 (ref 5–15)
BUN: 13 mg/dL (ref 8–23)
CHLORIDE: 95 mmol/L — AB (ref 98–111)
CO2: 23 mmol/L (ref 22–32)
Calcium: 9.5 mg/dL (ref 8.9–10.3)
Creatinine, Ser: 0.9 mg/dL (ref 0.44–1.00)
GFR calc Af Amer: >60 mL/min (ref >60)
GFR calc non Af Amer: 60 mL/min — ABNORMAL LOW (ref >60)
Glucose, Bld: 97 mg/dL (ref 70–99)
Potassium: 4.8 mmol/L (ref 3.5–5.1)
SODIUM: 130 mmol/L — AB (ref 135–145)
Total Bilirubin: 0.7 mg/dL (ref 0.3–1.2)
Total Protein: 6.9 g/dL (ref 6.5–8.1)

## 2018-08-05 LAB — CBC WITH DIFFERENTIAL/PLATELET
Abs Immature Granulocytes: 0.02 10*3/uL (ref 0.00–0.07)
BASOS PCT: 1 %
Basophils Absolute: 0 10*3/uL (ref 0.0–0.1)
Eosinophils Absolute: 0.1 10*3/uL (ref 0.0–0.5)
Eosinophils Relative: 1 %
HCT: 37.5 % (ref 36.0–46.0)
Hemoglobin: 12.4 g/dL (ref 12.0–15.0)
Immature Granulocytes: 0 %
Lymphocytes Relative: 33 %
Lymphs Abs: 2.3 10*3/uL (ref 0.7–4.0)
MCH: 35.2 pg — ABNORMAL HIGH (ref 26.0–34.0)
MCHC: 33.1 g/dL (ref 30.0–36.0)
MCV: 106.5 fL — ABNORMAL HIGH (ref 80.0–100.0)
Monocytes Absolute: 0.8 10*3/uL (ref 0.1–1.0)
Monocytes Relative: 11 %
NRBC: 0 % (ref 0.0–0.2)
Neutro Abs: 3.9 10*3/uL (ref 1.7–7.7)
Neutrophils Relative %: 54 %
Platelets: 290 10*3/uL (ref 150–400)
RBC: 3.52 MIL/uL — AB (ref 3.87–5.11)
RDW: 12.2 % (ref 11.5–15.5)
WBC: 7.1 10*3/uL (ref 4.0–10.5)

## 2018-08-05 LAB — PROTIME-INR
INR: 1.33
PROTHROMBIN TIME: 16.3 s — AB (ref 11.4–15.2)

## 2018-08-05 LAB — APTT: aPTT: 31 seconds (ref 24–36)

## 2018-08-05 NOTE — Progress Notes (Addendum)
Ensure pre surgery drink given with instructions to complete by 0800 dos, pt verbalized understanding.  Notified Sunday Spillers at [employer] of PT/INR/PTT results.

## 2018-08-06 ENCOUNTER — Ambulatory Visit (HOSPITAL_BASED_OUTPATIENT_CLINIC_OR_DEPARTMENT_OTHER): Payer: 59 | Admitting: Anesthesiology

## 2018-08-06 ENCOUNTER — Ambulatory Visit (HOSPITAL_BASED_OUTPATIENT_CLINIC_OR_DEPARTMENT_OTHER)
Admission: RE | Admit: 2018-08-06 | Discharge: 2018-08-06 | Disposition: A | Discharge status: Home / Self Care | Payer: 59 | Attending: General Surgery | Admitting: General Surgery

## 2018-08-06 ENCOUNTER — Encounter (HOSPITAL_BASED_OUTPATIENT_CLINIC_OR_DEPARTMENT_OTHER)
Admission: RE | Disposition: A | Discharge status: Home / Self Care | Payer: Self-pay | Source: Home / Self Care | Attending: General Surgery

## 2018-08-06 ENCOUNTER — Ambulatory Visit
Admission: RE | Admit: 2018-08-06 | Discharge: 2018-08-06 | Disposition: A | Discharge status: Home / Self Care | Payer: 59 | Source: Ambulatory Visit | Attending: General Surgery | Admitting: General Surgery

## 2018-08-06 ENCOUNTER — Encounter (HOSPITAL_BASED_OUTPATIENT_CLINIC_OR_DEPARTMENT_OTHER): Payer: Self-pay | Admitting: Anesthesiology

## 2018-08-06 DIAGNOSIS — C50912 Malignant neoplasm of unspecified site of left female breast: Secondary | ICD-10-CM | POA: Diagnosis not present

## 2018-08-06 DIAGNOSIS — C44729 Squamous cell carcinoma of skin of left lower limb, including hip: Secondary | ICD-10-CM | POA: Insufficient documentation

## 2018-08-06 DIAGNOSIS — I82721 Chronic embolism and thrombosis of deep veins of right upper extremity: Secondary | ICD-10-CM | POA: Diagnosis not present

## 2018-08-06 DIAGNOSIS — K219 Gastro-esophageal reflux disease without esophagitis: Secondary | ICD-10-CM | POA: Insufficient documentation

## 2018-08-06 DIAGNOSIS — G8929 Other chronic pain: Secondary | ICD-10-CM | POA: Insufficient documentation

## 2018-08-06 DIAGNOSIS — Q600 Renal agenesis, unilateral: Secondary | ICD-10-CM | POA: Diagnosis not present

## 2018-08-06 DIAGNOSIS — Z7901 Long term (current) use of anticoagulants: Secondary | ICD-10-CM | POA: Insufficient documentation

## 2018-08-06 DIAGNOSIS — I10 Essential (primary) hypertension: Secondary | ICD-10-CM | POA: Insufficient documentation

## 2018-08-06 DIAGNOSIS — Z803 Family history of malignant neoplasm of breast: Secondary | ICD-10-CM | POA: Insufficient documentation

## 2018-08-06 DIAGNOSIS — Z79899 Other long term (current) drug therapy: Secondary | ICD-10-CM | POA: Insufficient documentation

## 2018-08-06 DIAGNOSIS — D0512 Intraductal carcinoma in situ of left breast: Secondary | ICD-10-CM | POA: Diagnosis not present

## 2018-08-06 DIAGNOSIS — N6012 Diffuse cystic mastopathy of left breast: Secondary | ICD-10-CM | POA: Diagnosis not present

## 2018-08-06 DIAGNOSIS — E785 Hyperlipidemia, unspecified: Secondary | ICD-10-CM | POA: Diagnosis not present

## 2018-08-06 DIAGNOSIS — G40909 Epilepsy, unspecified, not intractable, without status epilepticus: Secondary | ICD-10-CM | POA: Insufficient documentation

## 2018-08-06 DIAGNOSIS — Z170 Estrogen receptor positive status [ER+]: Secondary | ICD-10-CM | POA: Diagnosis not present

## 2018-08-06 DIAGNOSIS — R921 Mammographic calcification found on diagnostic imaging of breast: Secondary | ICD-10-CM | POA: Diagnosis not present

## 2018-08-06 HISTORY — PX: BREAST LUMPECTOMY: SHX2

## 2018-08-06 HISTORY — PX: BREAST LUMPECTOMY WITH RADIOACTIVE SEED LOCALIZATION: SHX6424

## 2018-08-06 SURGERY — BREAST LUMPECTOMY WITH RADIOACTIVE SEED LOCALIZATION
Anesthesia: General | Site: Breast | Laterality: Left

## 2018-08-06 MED ORDER — ACETAMINOPHEN 500 MG PO TABS
1000.0000 mg | ORAL_TABLET | ORAL | Status: AC
  Administered 2018-08-06: 1000 mg via ORAL

## 2018-08-06 MED ORDER — SODIUM CHLORIDE 0.9% FLUSH: 3.0000 mL | INTRAVENOUS | Status: DC | PRN

## 2018-08-06 MED ORDER — EPHEDRINE 5 MG/ML INJ
INTRAVENOUS | Status: AC
  Filled 2018-08-06: qty 10

## 2018-08-06 MED ORDER — DEXAMETHASONE SODIUM PHOSPHATE 4 MG/ML IJ SOLN
INTRAMUSCULAR | Status: DC | PRN
  Administered 2018-08-06: 4 mg via INTRAVENOUS

## 2018-08-06 MED ORDER — FENTANYL CITRATE (PF) 100 MCG/2ML IJ SOLN
INTRAMUSCULAR | Status: AC
  Filled 2018-08-06: qty 2

## 2018-08-06 MED ORDER — SCOPOLAMINE 1 MG/3DAYS TD PT72: 1.0000 | MEDICATED_PATCH | Freq: Once | TRANSDERMAL | Status: DC | PRN

## 2018-08-06 MED ORDER — OXYCODONE HCL 5 MG PO TABS: 5.0000 mg | ORAL_TABLET | ORAL | Status: DC | PRN

## 2018-08-06 MED ORDER — ACETAMINOPHEN 325 MG PO TABS: 650.0000 mg | ORAL_TABLET | ORAL | Status: DC | PRN

## 2018-08-06 MED ORDER — ONDANSETRON HCL 4 MG/2ML IJ SOLN
INTRAMUSCULAR | Status: AC
  Filled 2018-08-06: qty 2

## 2018-08-06 MED ORDER — 0.9 % SODIUM CHLORIDE (POUR BTL) OPTIME
TOPICAL | Status: DC | PRN
  Administered 2018-08-06: 500 mL

## 2018-08-06 MED ORDER — LACTATED RINGERS IV SOLN: INTRAVENOUS | Status: DC

## 2018-08-06 MED ORDER — CHLORHEXIDINE GLUCONATE CLOTH 2 % EX PADS: 6.0000 | MEDICATED_PAD | Freq: Once | CUTANEOUS | Status: DC

## 2018-08-06 MED ORDER — PROPOFOL 10 MG/ML IV BOLUS
INTRAVENOUS | Status: AC
  Filled 2018-08-06: qty 20

## 2018-08-06 MED ORDER — FENTANYL CITRATE (PF) 100 MCG/2ML IJ SOLN
50.0000 ug | INTRAMUSCULAR | Status: DC | PRN
  Administered 2018-08-06: 25 ug via INTRAVENOUS
  Administered 2018-08-06: 50 ug via INTRAVENOUS

## 2018-08-06 MED ORDER — VANCOMYCIN HCL IN DEXTROSE 1-5 GM/200ML-% IV SOLN
INTRAVENOUS | Status: AC
  Filled 2018-08-06: qty 200

## 2018-08-06 MED ORDER — FENTANYL CITRATE (PF) 100 MCG/2ML IJ SOLN
25.0000 ug | INTRAMUSCULAR | Status: DC | PRN
  Administered 2018-08-06 (×2): 25 ug via INTRAVENOUS

## 2018-08-06 MED ORDER — ACETAMINOPHEN 500 MG PO TABS
ORAL_TABLET | ORAL | Status: AC
  Filled 2018-08-06: qty 2

## 2018-08-06 MED ORDER — GABAPENTIN 300 MG PO CAPS
ORAL_CAPSULE | ORAL | Status: AC
  Filled 2018-08-06: qty 1

## 2018-08-06 MED ORDER — ACETAMINOPHEN 160 MG/5ML PO SOLN: 1000.0000 mg | Freq: Once | ORAL | Status: DC | PRN

## 2018-08-06 MED ORDER — EPHEDRINE SULFATE 50 MG/ML IJ SOLN
INTRAMUSCULAR | Status: DC | PRN
  Administered 2018-08-06: 10 mg via INTRAVENOUS

## 2018-08-06 MED ORDER — OXYCODONE HCL 5 MG/5ML PO SOLN: 5.0000 mg | Freq: Once | ORAL | Status: AC | PRN

## 2018-08-06 MED ORDER — ACETAMINOPHEN 500 MG PO TABS: 1000.0000 mg | ORAL_TABLET | Freq: Four times a day (QID) | ORAL | Status: DC

## 2018-08-06 MED ORDER — LIDOCAINE 2% (20 MG/ML) 5 ML SYRINGE
INTRAMUSCULAR | Status: DC | PRN
  Administered 2018-08-06: 30 mg via INTRAVENOUS

## 2018-08-06 MED ORDER — ONDANSETRON HCL 4 MG/2ML IJ SOLN
INTRAMUSCULAR | Status: DC | PRN
  Administered 2018-08-06: 4 mg via INTRAVENOUS

## 2018-08-06 MED ORDER — ACETAMINOPHEN 650 MG RE SUPP: 650.0000 mg | RECTAL | Status: DC | PRN

## 2018-08-06 MED ORDER — LIDOCAINE 2% (20 MG/ML) 5 ML SYRINGE
INTRAMUSCULAR | Status: AC
  Filled 2018-08-06: qty 5

## 2018-08-06 MED ORDER — ACETAMINOPHEN 10 MG/ML IV SOLN: 1000.0000 mg | Freq: Once | INTRAVENOUS | Status: DC | PRN

## 2018-08-06 MED ORDER — HYDROCODONE-ACETAMINOPHEN 5-325 MG PO TABS: 1.0000 | ORAL_TABLET | Freq: Four times a day (QID) | ORAL | 0 refills | Status: DC | PRN

## 2018-08-06 MED ORDER — SODIUM CHLORIDE 0.9% FLUSH: 3.0000 mL | Freq: Two times a day (BID) | INTRAVENOUS | Status: DC

## 2018-08-06 MED ORDER — OXYCODONE HCL 5 MG PO TABS
ORAL_TABLET | ORAL | Status: AC
  Filled 2018-08-06: qty 1

## 2018-08-06 MED ORDER — LACTATED RINGERS IV SOLN
INTRAVENOUS | Status: DC
  Administered 2018-08-06: 11:00:00 via INTRAVENOUS

## 2018-08-06 MED ORDER — FENTANYL CITRATE (PF) 100 MCG/2ML IJ SOLN
25.0000 ug | INTRAMUSCULAR | Status: DC | PRN
  Administered 2018-08-06: 25 ug via INTRAVENOUS

## 2018-08-06 MED ORDER — BUPIVACAINE-EPINEPHRINE (PF) 0.25% -1:200000 IJ SOLN
INTRAMUSCULAR | Status: DC | PRN
  Administered 2018-08-06: 10 mL

## 2018-08-06 MED ORDER — VANCOMYCIN HCL IN DEXTROSE 1-5 GM/200ML-% IV SOLN
1000.0000 mg | INTRAVENOUS | Status: AC
  Administered 2018-08-06: 1000 mg via INTRAVENOUS

## 2018-08-06 MED ORDER — ACETAMINOPHEN 500 MG PO TABS: 1000.0000 mg | ORAL_TABLET | Freq: Once | ORAL | Status: DC | PRN

## 2018-08-06 MED ORDER — SODIUM CHLORIDE 0.9 % IV SOLN: 250.0000 mL | INTRAVENOUS | Status: DC | PRN

## 2018-08-06 MED ORDER — PROPOFOL 10 MG/ML IV BOLUS
INTRAVENOUS | Status: DC | PRN
  Administered 2018-08-06: 100 mg via INTRAVENOUS

## 2018-08-06 MED ORDER — GABAPENTIN 300 MG PO CAPS
300.0000 mg | ORAL_CAPSULE | ORAL | Status: AC
  Administered 2018-08-06: 300 mg via ORAL

## 2018-08-06 MED ORDER — DEXAMETHASONE SODIUM PHOSPHATE 10 MG/ML IJ SOLN
INTRAMUSCULAR | Status: AC
  Filled 2018-08-06: qty 1

## 2018-08-06 MED ORDER — OXYCODONE HCL 5 MG PO TABS
5.0000 mg | ORAL_TABLET | Freq: Once | ORAL | Status: AC | PRN
  Administered 2018-08-06: 5 mg via ORAL

## 2018-08-06 MED ORDER — MIDAZOLAM HCL 2 MG/2ML IJ SOLN: 1.0000 mg | INTRAMUSCULAR | Status: DC | PRN

## 2018-08-06 SURGICAL SUPPLY — 65 items
APPLIER CLIP 9.375 MED OPEN (MISCELLANEOUS)
BENZOIN TINCTURE PRP APPL 2/3 (GAUZE/BANDAGES/DRESSINGS) IMPLANT
BINDER BREAST LRG (GAUZE/BANDAGES/DRESSINGS) ×3 IMPLANT
BINDER BREAST MEDIUM (GAUZE/BANDAGES/DRESSINGS) IMPLANT
BINDER BREAST XLRG (GAUZE/BANDAGES/DRESSINGS) IMPLANT
BINDER BREAST XXLRG (GAUZE/BANDAGES/DRESSINGS) IMPLANT
BLADE HEX COATED 2.75 (ELECTRODE) ×3 IMPLANT
BLADE SURG 10 STRL SS (BLADE) IMPLANT
BLADE SURG 15 STRL LF DISP TIS (BLADE) ×1 IMPLANT
BLADE SURG 15 STRL SS (BLADE) ×2
CANISTER SUC SOCK COL 7IN (MISCELLANEOUS) IMPLANT
CANISTER SUCT 1200ML W/VALVE (MISCELLANEOUS) ×3 IMPLANT
CHLORAPREP W/TINT 26ML (MISCELLANEOUS) ×3 IMPLANT
CLIP APPLIE 9.375 MED OPEN (MISCELLANEOUS) IMPLANT
CLOSURE WOUND 1/2 X4 (GAUZE/BANDAGES/DRESSINGS)
COVER BACK TABLE 60X90IN (DRAPES) ×3 IMPLANT
COVER MAYO STAND STRL (DRAPES) ×3 IMPLANT
COVER PROBE W GEL 5X96 (DRAPES) ×3 IMPLANT
COVER WAND RF STERILE (DRAPES) IMPLANT
DECANTER SPIKE VIAL GLASS SM (MISCELLANEOUS) IMPLANT
DERMABOND ADVANCED (GAUZE/BANDAGES/DRESSINGS) ×2
DERMABOND ADVANCED .7 DNX12 (GAUZE/BANDAGES/DRESSINGS) ×1 IMPLANT
DRAPE LAPAROSCOPIC ABDOMINAL (DRAPES) ×3 IMPLANT
DRAPE UTILITY XL STRL (DRAPES) ×3 IMPLANT
DRSG PAD ABDOMINAL 8X10 ST (GAUZE/BANDAGES/DRESSINGS) ×3 IMPLANT
ELECT REM PT RETURN 9FT ADLT (ELECTROSURGICAL) ×3
ELECTRODE REM PT RTRN 9FT ADLT (ELECTROSURGICAL) ×1 IMPLANT
GAUZE SPONGE 4X4 12PLY STRL LF (GAUZE/BANDAGES/DRESSINGS) ×3 IMPLANT
GLOVE BIOGEL PI IND STRL 7.0 (GLOVE) ×1 IMPLANT
GLOVE BIOGEL PI IND STRL 8 (GLOVE) ×1 IMPLANT
GLOVE BIOGEL PI INDICATOR 7.0 (GLOVE) ×2
GLOVE BIOGEL PI INDICATOR 8 (GLOVE) ×2
GLOVE EUDERMIC 7 POWDERFREE (GLOVE) ×3 IMPLANT
GLOVE SURG SYN 8.0 (GLOVE) ×3 IMPLANT
GOWN STRL REIN XL XLG (GOWN DISPOSABLE) ×3 IMPLANT
GOWN STRL REUS W/ TWL LRG LVL3 (GOWN DISPOSABLE) ×1 IMPLANT
GOWN STRL REUS W/ TWL XL LVL3 (GOWN DISPOSABLE) ×1 IMPLANT
GOWN STRL REUS W/TWL LRG LVL3 (GOWN DISPOSABLE) ×2
GOWN STRL REUS W/TWL XL LVL3 (GOWN DISPOSABLE) ×2
ILLUMINATOR WAVEGUIDE N/F (MISCELLANEOUS) IMPLANT
KIT MARKER MARGIN INK (KITS) ×3 IMPLANT
LIGHT WAVEGUIDE WIDE FLAT (MISCELLANEOUS) IMPLANT
NEEDLE HYPO 25X1 1.5 SAFETY (NEEDLE) ×3 IMPLANT
NS IRRIG 1000ML POUR BTL (IV SOLUTION) ×3 IMPLANT
PACK BASIN DAY SURGERY FS (CUSTOM PROCEDURE TRAY) ×3 IMPLANT
PENCIL BUTTON HOLSTER BLD 10FT (ELECTRODE) ×3 IMPLANT
SHEET MEDIUM DRAPE 40X70 STRL (DRAPES) IMPLANT
SLEEVE SCD COMPRESS KNEE MED (MISCELLANEOUS) ×3 IMPLANT
SPONGE LAP 18X18 RF (DISPOSABLE) ×3 IMPLANT
SPONGE LAP 4X18 RFD (DISPOSABLE) IMPLANT
STRIP CLOSURE SKIN 1/2X4 (GAUZE/BANDAGES/DRESSINGS) IMPLANT
SUT ETHILON 3 0 FSL (SUTURE) IMPLANT
SUT MNCRL AB 4-0 PS2 18 (SUTURE) ×3 IMPLANT
SUT SILK 2 0 SH (SUTURE) ×3 IMPLANT
SUT VIC AB 2-0 CT1 27 (SUTURE)
SUT VIC AB 2-0 CT1 TAPERPNT 27 (SUTURE) IMPLANT
SUT VIC AB 3-0 SH 27 (SUTURE)
SUT VIC AB 3-0 SH 27X BRD (SUTURE) IMPLANT
SUT VICRYL 3-0 CR8 SH (SUTURE) ×3 IMPLANT
SYR 10ML LL (SYRINGE) ×3 IMPLANT
TOWEL GREEN STERILE FF (TOWEL DISPOSABLE) ×3 IMPLANT
TRAY FAXITRON CT DISP (TRAY / TRAY PROCEDURE) ×3 IMPLANT
TUBE CONNECTING 20'X1/4 (TUBING) ×1
TUBE CONNECTING 20X1/4 (TUBING) ×2 IMPLANT
YANKAUER SUCT BULB TIP NO VENT (SUCTIONS) ×3 IMPLANT

## 2018-08-06 NOTE — Interval H&P Note (Signed)
History and Physical Interval Note:  08/06/2018 9:37 AM  Tracy Peters  has presented today for surgery, with the diagnosis of LEEFT BREAST CANCER  The various methods of treatment have been discussed with the patient and family. After consideration of risks, benefits and other options for treatment, the patient has consented to  Procedure(s): LEFT BREAST LUMPECTOMY WITH RADIOACTIVE SEED LOCALIZATION (Left) as a surgical intervention .  The patient's history has been reviewed, patient examined, no change in status, stable for surgery.  I have reviewed the patient's chart and labs.  Questions were answered to the patient's satisfaction.     Adin Hector

## 2018-08-06 NOTE — Anesthesia Preprocedure Evaluation (Addendum)
Anesthesia Evaluation  Patient identified by MRN, date of birth, ID band Patient awake    Reviewed: Allergy & Precautions, NPO status , Patient's Chart, lab work & pertinent test results  History of Anesthesia Complications Negative for: history of anesthetic complications  Airway Mallampati: II  TM Distance: >3 FB Neck ROM: Full    Dental  (+) Dental Advisory Given   Pulmonary neg pulmonary ROS,    breath sounds clear to auscultation       Cardiovascular hypertension, Pt. on medications (-) angina(-) Past MI and (-) CHF  Rhythm:Regular     Neuro/Psych Seizures -,   Neuromuscular disease negative psych ROS   GI/Hepatic GERD  ,  Endo/Other    Renal/GU Renal disease     Musculoskeletal  (+) Arthritis ,   Abdominal   Peds  Hematology  (+) anemia ,   Anesthesia Other Findings   Reproductive/Obstetrics                            Anesthesia Physical Anesthesia Plan  ASA: II  Anesthesia Plan: General   Post-op Pain Management:    Induction: Intravenous  PONV Risk Score and Plan: 3 and Ondansetron and Dexamethasone  Airway Management Planned: LMA  Additional Equipment: None  Intra-op Plan:   Post-operative Plan: Extubation in OR  Informed Consent: I have reviewed the patients History and Physical, chart, labs and discussed the procedure including the risks, benefits and alternatives for the proposed anesthesia with the patient or authorized representative who has indicated his/her understanding and acceptance.     Dental advisory given  Plan Discussed with: CRNA and Surgeon  Anesthesia Plan Comments:         Anesthesia Quick Evaluation

## 2018-08-06 NOTE — Op Note (Addendum)
Patient Name:           Tracy Peters   Date of Surgery:        08/06/2018  Pre op Diagnosis:      Ductal carcinoma in situ left breast, lower inner quadrant, estrogen receptor positive  Post op Diagnosis:    Same  Procedure:                 Left breast lumpectomy with radioactive seed localization  Surgeon:                     Edsel Petrin. Dalbert Batman, M.D., FACS  Assistant:                      OR staff  Operative Indications:   This is a pleasant 85 year old female.. She is referred by Dr. Janine Ores for evaluation and management of ductal carcinoma in situ of the left breast. Imaging studies were performed at [facility].Dr. Alain Marion is her PCP. Dr. Beryle Beams is her hematologist. Jeneen Rinks gross, Pharm.D helps regulate her Coumadin and lovenox bridges when necessary.     Last mammogram 2 years ago. Recent screening mammogram shows a 7 mm cluster of calcifications in the left breast, lower inner quadrant. Image guided biopsy shows low-grade DCIS. ER 100%, PR 10%.      Comorbidities include venous thrombosis syndrome of uncertain cause. She developed spontaneous DVT of her right arm and has been on Coumadin for many years. She is followed by Dr. Beryle Beams for this. She has congenital absence of one kidney. Seizures in the past. Abdominal hysterectomy. Appendectomy. Open cholecystectomy. Left breast biopsy 40 years ago. Lumbar laminectomy. Squamous cell carcinoma skin left pretibial area. Right total hip replacement. Family history a sister had breast cancer, had a mastectomy, but died of other causes.  time.      We had a very long discussion I outlined surgical options. We talked about whether she would receive radiation therapy or not. We talked about antiestrogen therapy. Talked about venous thromboembolism as it relates to antiestrogen's..  At this time she seems interested in lumpectomy. She asked that I refer her to medical oncology and radiation oncology and we are  going to do that. She asked that I go ahead and schedule her surgery as well She'll be scheduled for left breast lumpectomy with radioactive seed localization.. She agrees with this plan. Her Coumadin  has been held for 5 days and she is on a Lovenox bridge.   Operative Findings:       The area of in situ cancer was in the lower inner left breast and in the breast was thin in this location.  The anterior margin is the skin.  The broad posterior margin is the pectoralis muscle.  The specimen mammogram looked good containing the marker clip and the seed adjacent to one another in the appeared centered in the specimen on 3D Faxitron imaging.  There was no palpable mass.  Procedure in Detail:          Following the induction of general LMA anesthesia the patient's left breast was prepped and draped in a sterile fashion.  Intravenous antibiotics were given.  Surgical timeout was performed.  0.25% Marcaine with epinephrine was used as a local infiltration anesthetic.     After review of the imaging and interrogating the seed with the neoprobe I chose a radially oriented linear incision at about the 8 o'clock position of the left breast.  The incision was made with a knife.  The lumpectomy was performed with electrocautery and the neoprobe.  The specimen was removed and marked with a 6 color ink kit and silk silk sutures to orient the pathologist.  Specimen was sent to the lab where the seed was retrieved.  Hemostasis was excellent.  I placed 5 metal marker clips in the walls of the lumpectomy cavity.  The breast tissues were closed with interrupted 3-0 Vicryl sutures and the skin closed with a running subcuticular 4-0 Monocryl and Dermabond.  Breast binder was placed and the patient was taken to PACU in stable condition.  EBL 10 to 15 cc.  Counts correct.  Complications none.   Addendum: I logged onto the PMP aware website and reviewed her prescription medication history     Krystan Northrop M. Dalbert Batman, M.D.,  FACS General and Minimally Invasive Surgery Breast and Colorectal Surgery  08/06/2018 12:18 PM

## 2018-08-06 NOTE — Discharge Instructions (Signed)
5mg  Oxycodone taken at 2:15pm  [facility] Office Phone Number [phone]  BREAST BIOPSY/ lUMPECTOMY: POST OP INSTRUCTIONS  Always review your discharge instruction sheet given to you by the facility where your surgery was performed.  IF YOU HAVE DISABILITY OR FAMILY LEAVE FORMS, YOU MUST BRING THEM TO THE OFFICE FOR PROCESSING.  DO NOT GIVE THEM TO YOUR DOCTOR.  1. A prescription for pain medication may be given to you upon discharge.  Take your pain medication as prescribed, if needed.  If narcotic pain medicine is not needed, then you may take acetaminophen (Tylenol) or ibuprofen (Advil) as needed. 1,000 mg Tylenol taken at 10:15am 2. Take your usually prescribed medications unless otherwise directed 3. If you need a refill on your pain medication, please contact your pharmacy.  They will contact our office to request authorization.  Prescriptions will not be filled after 5pm or on week-ends. 4. You should eat very light the first 24 hours after surgery, such as soup, crackers, pudding, etc.  Resume your normal diet the day after surgery. 5. Most patients will experience some swelling and bruising in the breast.  Ice packs and a good support bra will help.  Swelling and bruising can take several days to resolve.  6. It is common to experience some constipation if taking pain medication after surgery.  Increasing fluid intake and taking a stool softener will usually help or prevent this problem from occurring.  A mild laxative (Milk of Magnesia or Miralax) should be taken according to package directions if there are no bowel movements after 48 hours. 7. Unless discharge instructions indicate otherwise, you may remove your bandages 24-48 hours after surgery, and you may shower at that time.  You may have steri-strips (small skin tapes) in place directly over the incision.  These strips should be left on the skin for 7-10 days.  If your surgeon used skin glue on the incision, you  may shower in 24 hours.  The glue will flake off over the next 2-3 weeks.  Any sutures or staples will be removed at the office during your follow-up visit. 8. ACTIVITIES:  You may resume regular daily activities (gradually increasing) beginning the next day.  Wearing a good support bra or sports bra minimizes pain and swelling.  You may have sexual intercourse when it is comfortable. a. You may drive when you no longer are taking prescription pain medication, you can comfortably wear a seatbelt, and you can safely maneuver your car and apply brakes. b. RETURN TO WORK:  ______________________________________________________________________________________ 9. You should see your doctor in the office for a follow-up appointment approximately two weeks after your surgery.  Your doctors nurse will typically make your follow-up appointment when she calls you with your pathology report.  Expect your pathology report 2-3 business days after your surgery.  You may call to check if you do not hear from [name] after three days. 10. OTHER INSTRUCTIONS: _______________________________________________________________________________________________ _____________________________________________________________________________________________________________________________________ _____________________________________________________________________________________________________________________________________ _____________________________________________________________________________________________________________________________________  WHEN TO CALL YOUR DOCTOR: 1. Fever over 101.0 2. Nausea and/or vomiting. 3. Extreme swelling or bruising. 4. Continued bleeding from incision. 5. Increased pain, redness, or drainage from the incision.  The clinic staff is available to answer your questions during regular business hours.  Please dont hesitate to call and ask to speak to one of the nurses for clinical  concerns.  If you have a medical emergency, go to the nearest emergency room or call 911.  A surgeon from [facility] is always on call  at the hospital.  For further questions, please visit [email or web address] [place] Office Phone Number [phone]             Managing Your Pain After Surgery Without Opioids    Thank you for participating in our program to help patients manage their pain after surgery without opioids. This is part of our effort to provide you with the best care possible, without exposing you or your family to the risk that opioids pose.  What pain can I expect after surgery? You can expect to have some pain after surgery. This is normal. The pain is typically worse the day after surgery, and quickly begins to get better. Many studies have found that many patients are able to manage their pain after surgery with Over-the-Counter (OTC) medications such as Tylenol and Motrin. If you have a condition that does not allow you to take Tylenol or Motrin, notify your surgical team.  How will I manage my pain? The best strategy for controlling your pain after surgery is around the clock pain control with Tylenol (acetaminophen) and Motrin (ibuprofen or Advil). Alternating these medications with each other allows you to maximize your pain control. In addition to Tylenol and Motrin, you can use heating pads or ice packs on your incisions to help reduce your pain.  How will I alternate your regular strength over-the-counter pain medication? You will take a dose of pain medication every three hours. ; Start by taking 650 mg of Tylenol (2 pills of 325 mg) ; 3 hours later take 600 mg of Motrin (3 pills of 200 mg) ; 3 hours after taking the Motrin take 650 mg of Tylenol ; 3 hours after that take 600 mg of Motrin.   - 1 -  See example - if your first dose of Tylenol is at 12:00 PM   12:00 PM Tylenol 650 mg (2 pills of 325 mg)   3:00 PM Motrin 600 mg (3 pills of 200 mg)  6:00 PM Tylenol 650 mg (2 pills of 325 mg)  9:00 PM Motrin 600 mg (3 pills of 200 mg)  Continue alternating every 3 hours   We recommend that you follow this schedule around-the-clock for at least 3 days after surgery, or until you feel that it is no longer needed. Use the table on the last page of this handout to keep track of the medications you are taking. Important: Do not take more than 3000mg  of Tylenol or 3200mg  of Motrin in a 24-hour period. Do not take ibuprofen/Motrin if you have a history of bleeding stomach ulcers, severe kidney disease, &/or actively taking a blood thinner  What if I still have pain? If you have pain that is not controlled with the over-the-counter pain medications (Tylenol and Motrin or Advil) you might have what we call breakthrough pain. You will receive a prescription for a small amount of an opioid pain medication such as Oxycodone, Tramadol, or Tylenol with Codeine. Use these opioid pills in the first 24 hours after surgery if you have breakthrough pain. Do not take more than 1 pill every 4-6 hours.  If you still have uncontrolled pain after using all opioid pills, don't hesitate to call our staff using the number provided. We will help make sure you are managing your pain in the best way possible, and if necessary, we can provide a prescription for additional pain medication.   Day 1    Time  Name of Medication Number of pills taken  Amount  of Acetaminophen  Pain Level   Comments  AM PM       AM PM       AM PM       AM PM       AM PM       AM PM       AM PM       AM PM       Total Daily amount of Acetaminophen Do not take more than  3,000 mg per day      Day 2    Time  Name of Medication Number of pills taken  Amount of Acetaminophen  Pain Level   Comments  AM PM       AM PM       AM PM       AM PM       AM PM       AM PM       AM PM       AM PM       Total Daily amount of  Acetaminophen Do not take more than  3,000 mg per day      Day 3    Time  Name of Medication Number of pills taken  Amount of Acetaminophen  Pain Level   Comments  AM PM       AM PM       AM PM       AM PM          AM PM       AM PM       AM PM       AM PM       Total Daily amount of Acetaminophen Do not take more than  3,000 mg per day      Day 4    Time  Name of Medication Number of pills taken  Amount of Acetaminophen  Pain Level   Comments  AM PM       AM PM       AM PM       AM PM       AM PM       AM PM       AM PM       AM PM       Total Daily amount of Acetaminophen Do not take more than  3,000 mg per day      Day 5    Time  Name of Medication Number of pills taken  Amount of Acetaminophen  Pain Level   Comments  AM PM       AM PM       AM PM       AM PM       AM PM       AM PM       AM PM       AM PM       Total Daily amount of Acetaminophen Do not take more than  3,000 mg per day       Day 6    Time  Name of Medication Number of pills taken  Amount of Acetaminophen  Pain Level  Comments  AM PM       AM PM       AM PM       AM PM       AM PM       AM PM  AM PM       AM PM       Total Daily amount of Acetaminophen Do not take more than  3,000 mg per day      Day 7    Time  Name of Medication Number of pills taken  Amount of Acetaminophen  Pain Level   Comments  AM PM       AM PM       AM PM       AM PM       AM PM       AM PM       AM PM       AM PM       Total Daily amount of Acetaminophen Do not take more than  3,000 mg per day        For additional information about how and where to safely dispose of unused opioid medications - [employer]  Disclaimer: This document contains information and/or instructional materials adapted from [place] for the typical patient with your condition. It does not replace medical advice from your health care provider because your  experience may differ from that of the typical patient. Talk to your health care provider if you have any questions about this document, your condition or your treatment plan. Adapted from [place] Instructions  Activity: Get plenty of rest for the remainder of the day. A responsible individual must stay with you for 24 hours following the procedure.  For the next 24 hours, DO NOT: -Drive a car -Paediatric nurse -Drink alcoholic beverages -Take any medication unless instructed by your physician -Make any legal decisions or sign important papers.  Meals: Start with liquid foods such as gelatin or soup. Progress to regular foods as tolerated. Avoid greasy, spicy, heavy foods. If nausea and/or vomiting occur, drink only clear liquids until the nausea and/or vomiting subsides. Call your physician if vomiting continues.  Special Instructions/Symptoms: Your throat may feel dry or sore from the anesthesia or the breathing tube placed in your throat during surgery. If this causes discomfort, gargle with warm salt water. The discomfort should disappear within 24 hours.  If you had a scopolamine patch placed behind your ear for the management of post- operative nausea and/or vomiting:  1. The medication in the patch is effective for 72 hours, after which it should be removed.  Wrap patch in a tissue and discard in the trash. Wash hands thoroughly with soap and water. 2. You may remove the patch earlier than 72 hours if you experience unpleasant side effects which may include dry mouth, dizziness or visual disturbances. 3. Avoid touching the patch. Wash your hands with soap and water after contact with the patch.

## 2018-08-06 NOTE — Transfer of Care (Signed)
Immediate Anesthesia Transfer of Care Note  Patient: Tracy Peters  Procedure(s) Performed: LEFT BREAST LUMPECTOMY WITH RADIOACTIVE SEED LOCALIZATION (Left Breast)  Patient Location: PACU  Anesthesia Type:General  Level of Consciousness: awake and sedated  Airway & Oxygen Therapy: Patient Spontanous Breathing and Patient connected to face mask oxygen  Post-op Assessment: Report given to RN and Post -op Vital signs reviewed and stable  Post vital signs: Reviewed and stable  Last Vitals:  Vitals Value Taken Time  BP 120/56 08/06/2018 12:20 PM  Temp    Pulse 81 08/06/2018 12:22 PM  Resp 8 08/06/2018 12:22 PM  SpO2 100 % 08/06/2018 12:22 PM  Vitals shown include unvalidated device data.  Last Pain:  Vitals:   08/06/18 1026  TempSrc: Oral  PainSc: 4          Complications: No apparent anesthesia complications

## 2018-08-06 NOTE — Anesthesia Procedure Notes (Signed)
Procedure Name: LMA Insertion Performed by: Kaoru Benda W, CRNA Pre-anesthesia Checklist: Patient identified, Emergency Drugs available, Suction available and Patient being monitored Patient Re-evaluated:Patient Re-evaluated prior to induction Oxygen Delivery Method: Circle system utilized Preoxygenation: Pre-oxygenation with 100% oxygen Induction Type: IV induction Ventilation: Mask ventilation without difficulty LMA: LMA inserted LMA Size: 4.0 Number of attempts: 1 Placement Confirmation: positive ETCO2 Tube secured with: Tape Dental Injury: Teeth and Oropharynx as per pre-operative assessment        

## 2018-08-09 ENCOUNTER — Ambulatory Visit (INDEPENDENT_AMBULATORY_CARE_PROVIDER_SITE_OTHER): Payer: 59 | Admitting: Pharmacist

## 2018-08-09 ENCOUNTER — Encounter (HOSPITAL_BASED_OUTPATIENT_CLINIC_OR_DEPARTMENT_OTHER): Payer: Self-pay | Admitting: General Surgery

## 2018-08-09 ENCOUNTER — Encounter: Payer: Self-pay | Admitting: *Deleted

## 2018-08-09 ENCOUNTER — Other Ambulatory Visit: Payer: Self-pay | Admitting: Oncology

## 2018-08-09 ENCOUNTER — Ambulatory Visit (INDEPENDENT_AMBULATORY_CARE_PROVIDER_SITE_OTHER): Payer: 59 | Admitting: Oncology

## 2018-08-09 VITALS — BP 160/79 | HR 84 | Temp 98.2°F | Ht 65.0 in | Wt 138.9 lb

## 2018-08-09 DIAGNOSIS — Z885 Allergy status to narcotic agent status: Secondary | ICD-10-CM

## 2018-08-09 DIAGNOSIS — Z96641 Presence of right artificial hip joint: Secondary | ICD-10-CM | POA: Diagnosis not present

## 2018-08-09 DIAGNOSIS — M4802 Spinal stenosis, cervical region: Secondary | ICD-10-CM

## 2018-08-09 DIAGNOSIS — Z888 Allergy status to other drugs, medicaments and biological substances status: Secondary | ICD-10-CM

## 2018-08-09 DIAGNOSIS — M25551 Pain in right hip: Secondary | ICD-10-CM | POA: Diagnosis not present

## 2018-08-09 DIAGNOSIS — D6859 Other primary thrombophilia: Secondary | ICD-10-CM

## 2018-08-09 DIAGNOSIS — D539 Nutritional anemia, unspecified: Secondary | ICD-10-CM | POA: Diagnosis not present

## 2018-08-09 DIAGNOSIS — Z7901 Long term (current) use of anticoagulants: Secondary | ICD-10-CM

## 2018-08-09 DIAGNOSIS — Z5181 Encounter for therapeutic drug level monitoring: Secondary | ICD-10-CM | POA: Diagnosis not present

## 2018-08-09 DIAGNOSIS — Z86718 Personal history of other venous thrombosis and embolism: Secondary | ICD-10-CM

## 2018-08-09 DIAGNOSIS — D689 Coagulation defect, unspecified: Secondary | ICD-10-CM

## 2018-08-09 DIAGNOSIS — I10 Essential (primary) hypertension: Secondary | ICD-10-CM

## 2018-08-09 DIAGNOSIS — Z91041 Radiographic dye allergy status: Secondary | ICD-10-CM

## 2018-08-09 DIAGNOSIS — G5600 Carpal tunnel syndrome, unspecified upper limb: Secondary | ICD-10-CM | POA: Diagnosis not present

## 2018-08-09 DIAGNOSIS — G40909 Epilepsy, unspecified, not intractable, without status epilepticus: Secondary | ICD-10-CM | POA: Diagnosis not present

## 2018-08-09 DIAGNOSIS — Z86010 Personal history of colonic polyps: Secondary | ICD-10-CM

## 2018-08-09 DIAGNOSIS — K589 Irritable bowel syndrome without diarrhea: Secondary | ICD-10-CM | POA: Diagnosis not present

## 2018-08-09 DIAGNOSIS — M4722 Other spondylosis with radiculopathy, cervical region: Secondary | ICD-10-CM | POA: Diagnosis not present

## 2018-08-09 DIAGNOSIS — Z79899 Other long term (current) drug therapy: Secondary | ICD-10-CM

## 2018-08-09 DIAGNOSIS — Z85828 Personal history of other malignant neoplasm of skin: Secondary | ICD-10-CM

## 2018-08-09 DIAGNOSIS — Z880 Allergy status to penicillin: Secondary | ICD-10-CM

## 2018-08-09 DIAGNOSIS — Z91013 Allergy to seafood: Secondary | ICD-10-CM

## 2018-08-09 DIAGNOSIS — D0512 Intraductal carcinoma in situ of left breast: Secondary | ICD-10-CM

## 2018-08-09 DIAGNOSIS — Z8742 Personal history of other diseases of the female genital tract: Secondary | ICD-10-CM

## 2018-08-09 DIAGNOSIS — Z981 Arthrodesis status: Secondary | ICD-10-CM

## 2018-08-09 DIAGNOSIS — I742 Embolism and thrombosis of arteries of the upper extremities: Secondary | ICD-10-CM

## 2018-08-09 DIAGNOSIS — Z91030 Bee allergy status: Secondary | ICD-10-CM

## 2018-08-09 DIAGNOSIS — Z886 Allergy status to analgesic agent status: Secondary | ICD-10-CM

## 2018-08-09 LAB — POCT INR: INR: 1.5 — AB (ref 2.0–3.0)

## 2018-08-09 NOTE — Patient Instructions (Signed)
To coumadin clinic today  Please check with dr Plotnikov's office to see if they can monitor your warfarin starting next month.   Dr Jana Hakim can provide your ongoing Hematology care as well as managing your breast cancer.  It has been a pleasure working with you!!

## 2018-08-09 NOTE — Patient Instructions (Signed)
Patient instructed to take medications as defined in the Anti-coagulation Track section of this encounter.  Patient instructed to take today's dose and continue enoxaparin/Lovenox injections. Patient verbalized understanding of these instructions.  Patient instructed to take one-and one-half (1 &1/2) tablets of your 5mg  peach-colored warfarin tablets by mouth every day of the week EXCEPT take one (1) tablet on [facility] and FRIDAYS. Return to anticoagulation management clinic on Thursday, February 27th at 11:00 AM. Continue enoxaparin/Lovenox 90mg  to be administered at level of waist-band, 2 inches removed or away from your belly button. Inject yourself once daily at approximately 12:30PM.

## 2018-08-09 NOTE — Progress Notes (Signed)
Reviewed & agree Thanks DrG 

## 2018-08-09 NOTE — Progress Notes (Signed)
Inform patient of Pathology report,. Breast lumpectomy pathology is completely benign. Shows fibrocystic changes and postbiopsy changes This is obviously excellent news I will discuss with her in detail at the next office visit Let me know that you reached her  hmi

## 2018-08-09 NOTE — Progress Notes (Signed)
Anticoagulation Management Tracy Peters is a 85 y.o. female who reports to the clinic for monitoring of warfarin treatment.    Indication: Chronic anticoagulation, History of DVT, History of thrombosis of right radial artery; Primary hypercoagulable state.   Duration: indefinite Supervising physician: Murriel Hopper  Anticoagulation Clinic Visit History: Patient does not report signs/symptoms of bleeding or thromboembolism. Other recent changes: See patient findings for details on recent surgery. No other diet, medications, lifestyle endorsed at this time. Anticoagulation Episode Summary    Current INR goal:   2.0-3.0  TTR:   70.5 % (7.1 y)  Next INR check:   08/12/2018  INR from last check:   1.5! (08/09/2018)  Weekly max warfarin dose:     Target end date:   Indefinite  INR check location:   Anticoagulation Clinic  Preferred lab:     Send INR reminders to:   ANTICOAG IMP   Indications   Chronic anticoagulation [Z79.01] DVT HX OF [Z86.718] 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HCC) [I74.2] Primary hypercoagulable state ([place]) [D68.59] [D68.59] Long term (current) use of anticoagulants [Z79.01] [Z79.01]       Comments:         Anticoagulation Care Providers    Provider Role Specialty Phone number   Annia Belt, MD Referring Oncology [phone]      Allergies  Allergen Reactions  . Bee Venom Anaphylaxis and Other (See Comments)    Yellow jackets, wasps as well  . Ivp Dye [Iodinated Diagnostic Agents] Anaphylaxis  . Shellfish Allergy Anaphylaxis  . Temazepam Other (See Comments)    Severe Hallucinations and Paranoia  . Amlodipine Besylate Swelling  . Aspirin Other (See Comments)    REACTION: break out in sweats  . Atorvastatin Diarrhea  . Cholestyramine Other (See Comments)    REACTION: mouth irritation  . Codeine Phosphate Nausea And Vomiting  . Demerol [Meperidine] Other (See Comments)    Severe Hallucination!!!!  . Diphenhydramine Hcl Other  (See Comments)    hyperactive  . Doxycycline Nausea Only  . Gentamicin Sulfate Other (See Comments)    REACTION: Shortness of breath   . Keppra [Levetiracetam]     Made her too groggy/ sleepy  . Meclizine Hcl Other (See Comments)    REACTION: more dizziness on it  . Meperidine Hcl Other (See Comments)    Unknown  . Metronidazole Other (See Comments)    REACTION: bad taste  . Moxifloxacin Other (See Comments)    REACTION: insomnia Able to use Cipro  . Sulfamethoxazole Other (See Comments)    Kidney problems  . Penicillins Swelling, Rash and Other (See Comments)    Has patient had a PCN reaction causing immediate rash, facial/tongue/throat swelling, SOB or lightheadedness with hypotension: Yes Has patient had a PCN reaction causing severe rash involving mucus membranes or skin necrosis: No Has patient had a PCN reaction that required hospitalization: No Has patient had a PCN reaction occurring within the last 10 years: No If all of the above answers are "NO", then may proceed with Cephalosporin use.    Prior to Admission medications   Medication Sig Start Date End Date Taking? Authorizing Provider  colesevelam (WELCHOL) 625 MG tablet Take 2 tablets (1,250 mg total) by mouth 2 (two) times daily with a meal. 04/20/18 04/20/19 Yes Plotnikov, Evie Lacks, MD  diphenoxylate-atropine (LOMOTIL) 2.5-0.025 MG tablet Take 1-2 tablets by mouth 4 (four) times daily as needed for diarrhea or loose stools. 05/19/18  Yes Plotnikov, Evie Lacks, MD  enoxaparin (LOVENOX) 100 MG/ML  injection Inject 0.9 mLs (90 mg total) into the skin daily. 08/02/18  Yes Jorene Guest B, RPH-CPP  EPINEPHrine 0.3 mg/0.3 mL IJ SOAJ injection As dirrected 01/27/17  Yes Plotnikov, Evie Lacks, MD  ferrous sulfate 325 (65 FE) MG tablet Take 1 tablet (325 mg total) by mouth 3 (three) times daily after meals. Patient taking differently: Take 325 mg by mouth every other day.  06/18/17  Yes Babish, Rodman Key, PA-C  fluticasone (FLONASE) 50  MCG/ACT nasal spray Place 2 sprays into both nostrils daily. Patient taking differently: Place 2 sprays into both nostrils daily as needed for allergies or rhinitis.  07/29/17  Yes Plotnikov, Evie Lacks, MD  HYDROcodone-acetaminophen (NORCO) 5-325 MG tablet Take 1 tablet by mouth every 6 (six) hours as needed for moderate pain or severe pain. 08/06/18  Yes Fanny Skates, MD  lacosamide (VIMPAT) 50 MG TABS tablet Take 1 tablet (50 mg total) by mouth 2 (two) times daily. 07/07/18  Yes Penumalli, Earlean Polka, MD  losartan (COZAAR) 100 MG tablet Take 0.5 tablets (50 mg total) by mouth daily. 01/12/18  Yes Plotnikov, Evie Lacks, MD  Probiotic Product (ALIGN PO) Take by mouth. One cap by mouth Bid.   Yes [provider]  valACYclovir (VALTREX) 1000 MG tablet TAKE 1 TABLET BY MOUTH TWICE DAILY. 02/07/18  Yes Plotnikov, Evie Lacks, MD  warfarin (COUMADIN) 5 MG tablet Take 1 and 1/2 tablets on Sundays, Tuesdays, Thursdays and Saturdays. All other days, take only one (1) tablet. 07/12/18  Yes Pennie Banter, RPH-CPP  zolpidem (AMBIEN) 10 MG tablet 1 & 1/2 TABLETS (15MG ) BY MOUTH AT BEDTIME AS NEEDED FOR SLEEP, OK TOREPEAT IN 4HRS 03/05/18  Yes Plotnikov, Evie Lacks, MD   Past Medical History:  Diagnosis Date  . Adenomatous colon polyp   . Allergic rhinitis    uses FLonase nightly  . Anemia   . Arthritis    joint pain  . Blood transfusion   . Bronchitis    hx of-7-54yrs ago  . Carpal tunnel syndrome    numbness and tingling   . Cataracts, bilateral   . Chronic anticoagulation 03/21/2013  . Congenital absence of one kidney    pt only has one kidney  . Diarrhea    takes Immodium daily as needed or Lomotil   . Diverticulosis   . GERD (gastroesophageal reflux disease)    mild  . Herpes    takes Valtrex daily  . History of colon polyps   . History of DVT (deep vein thrombosis) 1998   right arm  . History of staph infection   . Hyperlipidemia    Medical MD doesn't think its absolutely necessary  .  Hypertension    takes Losartan daily  . IBS (irritable bowel syndrome)    takes Electronics engineer daily  . Insomnia    takes Ambien nightly as needed  . LBP (low back pain)   . Long term (current) use of anticoagulants    Dr. Beryle Beams  . Nocturia   . Osteoporosis   . Raynaud's disease   . 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 Winfield Cunas  . Seizures ([place]) 10/20/2017   has not had a seizure since may 2019  . 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 Urinary leakage   . UTI (urinary tract infection)    Social History   Socioeconomic History  . Marital status: Married    Spouse name: Not on file  .  Number of children: 2  . Years of education: Not on file  . Highest education level: Not on file  Occupational History  . Occupation: Retired    Fish farm manager: RETIRED  Social Needs  . Financial resource strain: Not on file  . Food insecurity:    Worry: Not on file    Inability: Not on file  . Transportation needs:    Medical: No    Non-medical: No  Tobacco Use  . Smoking status: Never Smoker  . Smokeless tobacco: Never Used  Substance and Sexual Activity  . Alcohol use: No    Alcohol/week: 0.0 standard drinks  . Drug use: No  . Sexual activity: Never  Lifestyle  . Physical activity:    Days per week: Not on file    Minutes per session: Not on file  . Stress: Not on file  Relationships  . Social connections:    Talks on phone: Not on file    Gets together: Not on file    Attends religious service: Not on file    Active member of club or organization: Not on file    Attends meetings of clubs or organizations: Not on file    Relationship status: Not on file  Other Topics Concern  . Not on file  Social History Narrative   Lives home with husband.  Education- graduate level.  Children 2.     Family History  Problem Relation Age of Onset  . Breast cancer Sister   . Kidney disease Sister 64       nephritis  .  Parkinsonism Mother   . Heart disease Brother   . Osteoarthritis Other   . Ulcerative colitis Sister   . Kidney disease Sister   . Colon cancer Neg Hx     ASSESSMENT Recent Results: The most recent result is correlated with 27.5 mg per week: Patient instructed to hold warfarin for 3 days (08/03/18 - 08/05/18) prior to surgery. Patient resumed warfarin on Friday, 08/06/18.  Lab Results  Component Value Date   INR 1.5 (A) 08/09/2018   INR 1.33 08/05/2018   INR 2.2 08/02/2018   PROTIME 39.6 (H) 04/03/2015    Anticoagulation Dosing: Description   Take one-and one-half (1 &1/2) tablets of your 5mg  peach-colored warfarin tablets by mouth every day of the week EXCEPT take one (1) tablet on WEDNESDAYS and FRIDAYS. Return to anticoagulation management clinic on Thursday, February 27th at 11:00 AM. Continue enoxaparin/Lovenox 90mg  to be administered at level of waist-band, 2 inches removed or away from your belly button. Inject yourself once daily at approximately 12:30PM.  DR Azucena Freed PATIENT (route warfarin notes and list as authorizing provider for LOS & Follow-up, lab orders and warfarin prescriptions), do not list attending physician       INR today: Subtherapeutic  PLAN Resuming warfarin regimen from prior to surgery, with enoxaparin/Lovenox bridge until patient's INR is therapeutic Patient instructed to take an additional 2.5 mg warfarin (total 7.5 mg) 08/09/18 PM Prior to surgery patient was on warfarin 7.5 mg PO every day of the week EXCEPT 5 mg PO on Monday, Wednesdays, and Fridays Patient to return in 3 days to assess INR and need for continued use of enoxaparin/Lovenox  Patient Instructions  Patient instructed to take medications as defined in the Anti-coagulation Track section of this encounter.  Patient instructed to take today's dose and continue enoxaparin/Lovenox injections. Patient verbalized understanding of these instructions.  Patient instructed to take one-and  one-half (1 &1/2) tablets of your 5mg   peach-colored warfarin tablets by mouth every day of the week EXCEPT take one (1) tablet on WEDNESDAYS and FRIDAYS. Return to anticoagulation management clinic on Thursday, February 27th at 11:00 AM. Continue enoxaparin/Lovenox 90mg  to be administered at level of waist-band, 2 inches removed or away from your belly button. Inject yourself once daily at approximately 12:30PM.   Patient advised to contact clinic or seek medical attention if signs/symptoms of bleeding or thromboembolism occur.  Patient verbalized understanding by repeating back information and was advised to contact me if further medication-related questions arise. Patient was also provided an information handout.  Follow-up Return in 3 days (on 08/12/2018) for INR Follow-up at 11:00 AM.  15 minutes spent face-to-face with the patient during the encounter. 50% of time spent on education, including signs/sx bleeding and clotting, as well as food and drug interactions with warfarin. 50% of time was spent on fingerprick POC INR sample collection,processing, results determination, and documentation in http://www.kim.net/.  Thank you for allowing pharmacy to be a part of this patient's care.  Leron Croak, PharmD PGY1 Pharmacy Resident

## 2018-08-09 NOTE — Progress Notes (Addendum)
Hematology and Oncology Follow Up Visit  Tracy Peters [id number] [birth year] 85 y.o. 08/09/2018 4:41 PM   Principle Diagnosis: Encounter Diagnoses  Name Primary?  . Ductal carcinoma in situ (DCIS) of left breast Yes  . Chronic anticoagulation   . Primary hypercoagulable state [facility]) [D68.59]   Clinical summary: 85 year old woman I have followed for many years. She sustained an arterial embolus to an index finger of her right hand involving a right radial artery in 1999 which happened coincidentally with starting a Cox 2 inhibitor. She has been maintained on chronic Coumadin anticoagulation since that time and has had no subsequent thrombotic events.  She has known degenerative arthritis of the spine and has had 2 operations in the past.  She developed a cervical radiculopathysecondary to cervical stenosis and spondylolysis. She had successful surgery on her cervical spine with anterior cervical decompression and discectomy and a fusion at 3 levels by Dr. Newman Pies on January 18, 2014.  She had residual paresthesias in some of her fingers of the right hand. She was reevaluated by Dr. Arnoldo Morale and found to have carpal tunnel syndrome. She underwent a another surgical procedure on 06/12/2014. She has had significant improvement of the paresthesias now limited just to the middle finger. She had a right hip replacement in December 2018.  She required a second surgery in March 2019 when the arthroplasty dislocated. She was just diagnosed with intraductal carcinoma of the left breast last month.  (January 2020). She developed an idiopathic seizure disorder and May 2019.  See below for details.  Interim History: She was just diagnosed with intraductal carcinoma of her left breast found on mammogram.  Initial biopsy July 09, 2018.  Lumpectomy by Dr. Dalbert Batman on February 21.  Her warfarin anticoagulant was bridged with Lovenox.  She is back on her Coumadin.  She has only had 3 doses.  Not  surprisingly, INR today is subtherapeutic at 1.5 and she will continue Lovenox until she is therapeutic.  She saw Dr. Jana Hakim, medical oncology and Dr.John Lisbeth Renshaw, radiation oncology.  Dr. Jana Hakim recommended radiation and not hormonal therapy.  See his detailed office note. Her main complaint today is persistent right hip pain.  She had a right hip replacement June 15, 2017.  The arthroplasty dislocated on August 23, 2017 at she underwent a closed reduction.  She was initially put on low-dose oxycodone for the pain but she finds that tramadol works better.  She has no cardiorespiratory complaints.  No bleeding on the chronic Coumadin.  No leg swelling or pain.  She is now on Vimpat to prevent seizures.  She presented with a apparent grand mal seizure in May 2019.  Blood pressure 564 systolic.  No focal neurologic deficits.  Negative CT brain.  She had another witnessed seizure subsequent to that while she was under evaluation in the emergency department.  MRI showed no acute stroke or hemorrhage.  Moderate small vessel ischemic changes.  She was initially treated with Keppra, subsequently put on Depakote.  Her husband accompanies her today.  Medications: reviewed  Allergies:  Allergies  Allergen Reactions  . Bee Venom Anaphylaxis and Other (See Comments)    Yellow jackets, wasps as well  . Ivp Dye [Iodinated Diagnostic Agents] Anaphylaxis  . Shellfish Allergy Anaphylaxis  . Temazepam Other (See Comments)    Severe Hallucinations and Paranoia  . Amlodipine Besylate Swelling  . Aspirin Other (See Comments)    REACTION: break out in sweats  . Atorvastatin Diarrhea  . Cholestyramine Other (See  Comments)    REACTION: mouth irritation  . Codeine Phosphate Nausea And Vomiting  . Demerol [Meperidine] Other (See Comments)    Severe Hallucination!!!!  . Diphenhydramine Hcl Other (See Comments)    hyperactive  . Doxycycline Nausea Only  . Gentamicin Sulfate Other (See Comments)     REACTION: Shortness of breath   . Keppra [Levetiracetam]     Made her too groggy/ sleepy  . Meclizine Hcl Other (See Comments)    REACTION: more dizziness on it  . Meperidine Hcl Other (See Comments)    Unknown  . Metronidazole Other (See Comments)    REACTION: bad taste  . Moxifloxacin Other (See Comments)    REACTION: insomnia Able to use Cipro  . Sulfamethoxazole Other (See Comments)    Kidney problems  . Penicillins Swelling, Rash and Other (See Comments)    Has patient had a PCN reaction causing immediate rash, facial/tongue/throat swelling, SOB or lightheadedness with hypotension: Yes Has patient had a PCN reaction causing severe rash involving mucus membranes or skin necrosis: No Has patient had a PCN reaction that required hospitalization: No Has patient had a PCN reaction occurring within the last 10 years: No If all of the above answers are "NO", then may proceed with Cephalosporin use.     Review of Systems: See interim history Remaining ROS negative:   Physical Exam: Blood pressure (!) 160/79, pulse 84, temperature 98.2 F (36.8 C), temperature source Oral, height 5\' 5"  (1.651 m), weight 138 lb 14.4 oz (63 kg), SpO2 97 %. Wt Readings from Last 3 Encounters:  08/09/18 138 lb 14.4 oz (63 kg)  08/06/18 133 lb 9.6 oz (60.6 kg)  08/03/18 132 lb 9.6 oz (60.1 kg)     General appearance: Well-nourished Caucasian woman HENNT: Pharynx no erythema, exudate, mass, or ulcer. No thyromegaly or thyroid nodules Lymph nodes: No cervical, supraclavicular, or axillary lymphadenopathy Breasts: Healing lumpectomy scar left breast.  Inferior midclavicular line. Lungs: Clear to auscultation, resonant to percussion throughout Heart: Regular rhythm, no murmur, no gallop, no rub, no click, no edema Abdomen: Soft, nontender, normal bowel sounds, no mass, no organomegaly Extremities: No edema, no calf tenderness Musculoskeletal: no joint deformities GU:  Vascular: Carotid pulses 2+, no  bruits, symmetric Neurologic: Alert, oriented, PERRLA, optic discs sharp and vessels normal, no hemorrhage or exudate, cranial nerves grossly normal, motor strength 5 over 5, reflexes 1+ symmetric, upper body coordination normal, gait normal, Skin: No rash or ecchymosis  Lab Results: CBC W/Diff    Component Value Date/Time   WBC 7.1 08/05/2018 1004   RBC 3.52 (L) 08/05/2018 1004   HGB 12.4 08/05/2018 1004   HGB 12.6 08/18/2016 1148   HGB 12.7 03/21/2014 1058   HCT 37.5 08/05/2018 1004   HCT 36.9 08/18/2016 1148   HCT 38.7 03/21/2014 1058   PLT 290 08/05/2018 1004   PLT 375 08/18/2016 1148   MCV 106.5 (H) 08/05/2018 1004   MCV 108 (H) 08/18/2016 1148   MCV 110.4 (H) 03/21/2014 1058   MCH 35.2 (H) 08/05/2018 1004   MCHC 33.1 08/05/2018 1004   RDW 12.2 08/05/2018 1004   RDW 14.7 08/18/2016 1148   RDW 13.3 03/21/2014 1058   LYMPHSABS 2.3 08/05/2018 1004   LYMPHSABS 2.5 08/18/2016 1148   LYMPHSABS 1.6 03/21/2014 1058   MONOABS 0.8 08/05/2018 1004   MONOABS 0.6 03/21/2014 1058   EOSABS 0.1 08/05/2018 1004   EOSABS 0.1 08/18/2016 1148   BASOSABS 0.0 08/05/2018 1004   BASOSABS 0.0 08/18/2016 1148  BASOSABS 0.0 03/21/2014 1058     Chemistry      Component Value Date/Time   NA 130 (L) 08/05/2018 1004   NA 136 08/18/2016 1148   NA 132 (L) 03/21/2014 1100   K 4.8 08/05/2018 1004   K 4.9 03/21/2014 1100   CL 95 (L) 08/05/2018 1004   CL 97 (L) 03/16/2012 1344   CO2 23 08/05/2018 1004   CO2 26 03/21/2014 1100   BUN 13 08/05/2018 1004   BUN 21 08/18/2016 1148   BUN 16.0 03/21/2014 1100   CREATININE 0.90 08/05/2018 1004   CREATININE 0.9 03/21/2014 1100      Component Value Date/Time   CALCIUM 9.5 08/05/2018 1004   CALCIUM 9.9 03/21/2014 1100   ALKPHOS 46 08/05/2018 1004   ALKPHOS 51 03/21/2014 1100   AST 26 08/05/2018 1004   AST 22 03/21/2014 1100   ALT 21 08/05/2018 1004   ALT 15 03/21/2014 1100   BILITOT 0.7 08/05/2018 1004   BILITOT 0.5 08/18/2016 1148   BILITOT  0.36 03/21/2014 1100       Radiological Studies: Mm Breast Surgical Specimen  Result Date: 08/06/2018 CLINICAL DATA:  Evaluate specimen EXAM: SPECIMEN RADIOGRAPH OF THE LEFT BREAST COMPARISON:  Previous exam(s). FINDINGS: Status post excision of the left breast. The radioactive seed and biopsy marker clip are present, completely intact, and were marked for pathology.   Impression:  #1. 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 No subsequent thrombotic events on long-term Coumadin anticoagulation. Plan continue the same. In view of my retirement, she will no longer be able to have her Coumadin [facility].  I told her to check with her primary care physician.  He may be willing to monitor her INRs. She is out over 20 years from her isolated arterial thrombosis. She is now under the care of Dr. Jana Hakim for newly diagnosed DCIS.  He may be able to provide advice to her primary care with respect to managing her anticoagulation around any subsequent surgical procedures. I did make a formal referral to the cancer center.  #2. Degenerative arthritis of the spine status post cervical discectomy with fusion procedure and plate stabilization C4 through C7 August 2015  #3. Carpal tunnel syndrome status post right carpal tunnel repair in December 2015  #4. Chronic mild macrocytic anemia with normal N39 and folic acid levels  #5. Essential hypertension  #6. Irritable bowel syndrome  #7. History of genital herpes  #8. 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 Initial biopsy 02/27/2015.   #9.  Idiopathic seizure disorder diagnosed May 2019 currently on Vimpat.  #10.  Status post right hip replacement with second surgery when the prosthesis dislocated.  #11.  History of adenomatous polyps of the colon  CC: Patient Care Team: Plotnikov, Evie Lacks, MD as PCP -  General Justice Britain, MD (Orthopedic Surgery) Molli Posey, MD (Obstetrics and Gynecology) Lafayette Dragon, MD (Inactive) (Gastroenterology) Newman Pies, MD as Consulting Physician (Neurosurgery) Gaynelle Arabian, MD as Consulting Physician (Orthopedic Surgery) Paralee Cancel, MD as Consulting Physician (Orthopedic Surgery) Magrinat, Virgie Dad, MD as Consulting Physician (Oncology) Fanny Skates, MD as Consulting Physician (General Surgery)   Murriel Hopper, MD, [place]  Hematology-Oncology/Internal Medicine     2/24/20204:41 PM

## 2018-08-10 NOTE — Telephone Encounter (Signed)
error 

## 2018-08-10 NOTE — Anesthesia Postprocedure Evaluation (Signed)
Anesthesia Post Note  Patient: Temeka Pore  Procedure(s) Performed: LEFT BREAST LUMPECTOMY WITH RADIOACTIVE SEED LOCALIZATION (Left Breast)     Patient location during evaluation: PACU Anesthesia Type: General Level of consciousness: awake and alert Pain management: pain level controlled Vital Signs Assessment: post-procedure vital signs reviewed and stable Respiratory status: spontaneous breathing, nonlabored ventilation, respiratory function stable and patient connected to nasal cannula oxygen Cardiovascular status: blood pressure returned to baseline and stable Postop Assessment: no apparent nausea or vomiting Anesthetic complications: no    Last Vitals:  Vitals:   08/06/18 1345 08/06/18 1445  BP: (!) 163/75 (!) 170/86  Pulse: 79 79  Resp: (!) 9 16  Temp:  36.6 C  SpO2: 97% 97%    Last Pain:  Vitals:   08/06/18 1445  TempSrc:   PainSc: 4                  Indi Willhite

## 2018-08-12 ENCOUNTER — Ambulatory Visit (INDEPENDENT_AMBULATORY_CARE_PROVIDER_SITE_OTHER): Payer: 59 | Admitting: Pharmacist

## 2018-08-12 DIAGNOSIS — Z86718 Personal history of other venous thrombosis and embolism: Secondary | ICD-10-CM

## 2018-08-12 DIAGNOSIS — D6859 Other primary thrombophilia: Secondary | ICD-10-CM | POA: Diagnosis not present

## 2018-08-12 DIAGNOSIS — Z5181 Encounter for therapeutic drug level monitoring: Secondary | ICD-10-CM | POA: Diagnosis not present

## 2018-08-12 DIAGNOSIS — Z7901 Long term (current) use of anticoagulants: Secondary | ICD-10-CM | POA: Diagnosis not present

## 2018-08-12 DIAGNOSIS — I742 Embolism and thrombosis of arteries of the upper extremities: Secondary | ICD-10-CM | POA: Diagnosis not present

## 2018-08-12 LAB — POCT INR: INR: 1.7 — AB (ref 2.0–3.0)

## 2018-08-12 NOTE — Progress Notes (Signed)
Anticoagulation Management Tracy Peters is a 85 y.o. female who reports to the clinic for monitoring of warfarin treatment.    Indication: Chronic anticoagulation, History of DVT, History of thrombosis of right radial artery; Primary hypercoagulable state.   Duration: indefinite Supervising physician: Murriel Hopper  Anticoagulation Clinic Visit History: Patient does not report signs/symptoms of bleeding or thromboembolism. Other recent changes: See patient findings for details on recent surgery. No other diet, medications, lifestyle endorsed at this time.  Anticoagulation Episode Summary    Current INR goal:   2.0-3.0  TTR:   70.4 % (7.1 y)  Next INR check:   08/12/2018  INR from last check:   1.7! (08/12/2018)  Weekly max warfarin dose:     Target end date:   Indefinite  INR check location:   Anticoagulation Clinic  Preferred lab:     Send INR reminders to:   ANT[facility]AG [facility]   Indications   Chronic anticoagulation [Z79.01] DVT HX OF [Z86.718] 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HCC) [I74.2] Primary hypercoagulable state ([place]) [D68.59] [D68.59] Long term (current) use of anticoagulants [Z79.01] [Z79.01]       Comments:         Anticoagulation Care Providers    Provider Role Specialty Phone number   Annia Belt, MD Referring Oncology [phone]     Allergies  Allergen Reactions  . Bee Venom Anaphylaxis and Other (See Comments)    Yellow jackets, wasps as well  . Ivp Dye [Iodinated Diagnostic Agents] Anaphylaxis  . Shellfish Allergy Anaphylaxis  . Temazepam Other (See Comments)    Severe Hallucinations and Paranoia  . Amlodipine Besylate Swelling  . Aspirin Other (See Comments)    REACTION: break out in sweats  . Atorvastatin Diarrhea  . Cholestyramine Other (See Comments)    REACTION: mouth irritation  . Codeine Phosphate Nausea And Vomiting  . Demerol [Meperidine] Other (See Comments)    Severe Hallucination!!!!  . Diphenhydramine Hcl Other  (See Comments)    hyperactive  . Doxycycline Nausea Only  . Gentamicin Sulfate Other (See Comments)    REACTION: Shortness of breath   . Keppra [Levetiracetam]     Made her too groggy/ sleepy  . Meclizine Hcl Other (See Comments)    REACTION: more dizziness on it  . Meperidine Hcl Other (See Comments)    Unknown  . Metronidazole Other (See Comments)    REACTION: bad taste  . Moxifloxacin Other (See Comments)    REACTION: insomnia Able to use Cipro  . Sulfamethoxazole Other (See Comments)    Kidney problems  . Penicillins Swelling, Rash and Other (See Comments)    Has patient had a PCN reaction causing immediate rash, facial/tongue/throat swelling, SOB or lightheadedness with hypotension: Yes Has patient had a PCN reaction causing severe rash involving mucus membranes or skin necrosis: No Has patient had a PCN reaction that required hospitalization: No Has patient had a PCN reaction occurring within the last 10 years: No If all of the above answers are "NO", then may proceed with Cephalosporin use.    Medication Sig  colesevelam (WELCHOL) 625 MG tablet Take 2 tablets (1,250 mg total) by mouth 2 (two) times daily with a meal.  diphenoxylate-atropine (LOMOTIL) 2.5-0.025 MG tablet Take 1-2 tablets by mouth 4 (four) times daily as needed for diarrhea or loose stools.  enoxaparin (LOVENOX) 100 MG/ML injection Inject 0.9 mLs (90 mg total) into the skin daily.  EPINEPHrine 0.3 mg/0.3 mL IJ SOAJ injection As dirrected  ferrous sulfate 325 (65 FE)  MG tablet Take 1 tablet (325 mg total) by mouth 3 (three) times daily after meals. Patient taking differently: Take 325 mg by mouth every other day.   fluticasone (FLONASE) 50 MCG/ACT nasal spray Place 2 sprays into both nostrils daily. Patient taking differently: Place 2 sprays into both nostrils daily as needed for allergies or rhinitis.   HYDROcodone-acetaminophen (NORCO) 5-325 MG tablet Take 1 tablet by mouth every 6 (six) hours as needed for  moderate pain or severe pain.  lacosamide (VIMPAT) 50 MG TABS tablet Take 1 tablet (50 mg total) by mouth 2 (two) times daily.  losartan (COZAAR) 100 MG tablet Take 0.5 tablets (50 mg total) by mouth daily.  Probiotic Product (ALIGN PO) Take by mouth. One cap by mouth Bid.  valACYclovir (VALTREX) 1000 MG tablet TAKE 1 TABLET BY MOUTH TWICE DAILY.  warfarin (COUMADIN) 5 MG tablet Take 1 and 1/2 tablets on Sundays, Tuesdays, Thursdays and Saturdays. All other days, take only one (1) tablet.  zolpidem (AMBIEN) 10 MG tablet 1 & 1/2 TABLETS (15MG ) BY MOUTH AT BEDTIME AS NEEDED FOR SLEEP, OK TOREPEAT IN 4HRS   Past Medical History:  Diagnosis Date  . Adenomatous colon polyp   . Allergic rhinitis    uses FLonase nightly  . Anemia   . Arthritis    joint pain  . Blood transfusion   . Bronchitis    hx of-7-62yrs ago  . Carpal tunnel syndrome    numbness and tingling   . Cataracts, bilateral   . Chronic anticoagulation 03/21/2013  . Congenital absence of one kidney    pt only has one kidney  . Diarrhea    takes Immodium daily as needed or Lomotil   . Diverticulosis   . GERD (gastroesophageal reflux disease)    mild  . Herpes    takes Valtrex daily  . History of colon polyps   . History of DVT (deep vein thrombosis) 1998   right arm  . History of staph infection   . Hyperlipidemia    Medical MD doesn't think its absolutely necessary  . Hypertension    takes Losartan daily  . IBS (irritable bowel syndrome)    takes Electronics engineer daily  . Insomnia    takes Ambien nightly as needed  . LBP (low back pain)   . Long term (current) use of anticoagulants    Dr. Beryle Beams  . Nocturia   . Osteoporosis   . Raynaud's disease   . 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 Winfield Cunas  . Seizures ([place]) 10/20/2017   has not had a seizure since may 2019  . 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[place]) 09/16/2011   Right digital artery 1998 idiopathic   . Urinary leakage   . UTI (urinary tract  infection)    Social History   Socioeconomic History  . Marital status: Married    Spouse name: Not on file  . Number of children: 2  . Years of education: Not on file  . Highest education level: Not on file  Occupational History  . Occupation: Retired    Fish farm manager: RETIRED  Social Needs  . Financial resource strain: Not on file  . Food insecurity:    Worry: Not on file    Inability: Not on file  . Transportation needs:    Medical: No    Non-medical: No  Tobacco Use  . Smoking status: Never Smoker  . Smokeless tobacco: Never Used  Substance and Sexual Activity  . Alcohol use:  No    Alcohol/week: 0.0 standard drinks  . Drug use: No  . Sexual activity: Never  Lifestyle  . Physical activity:    Days per week: Not on file    Minutes per session: Not on file  . Stress: Not on file  Relationships  . Social connections:    Talks on phone: Not on file    Gets together: Not on file    Attends religious service: Not on file    Active member of club or organization: Not on file    Attends meetings of clubs or organizations: Not on file    Relationship status: Not on file  Other Topics Concern  . Not on file  Social History Narrative   Lives home with husband.  Education- graduate level.  Children 2.     Family History  Problem Relation Age of Onset  . Breast cancer Sister   . Kidney disease Sister 70       nephritis  . Parkinsonism Mother   . Heart disease Brother   . Osteoarthritis Other   . Ulcerative colitis Sister   . Kidney disease Sister   . Colon cancer Neg Hx    ASSESSMENT Lab Results  Component Value Date   INR 1.7 (A) 08/12/2018   INR 1.5 (A) 08/09/2018   INR 1.33 08/05/2018   PROTIME 39.6 (H) 04/03/2015    INR today: Subtherapeutic  PLAN Continue enoxaparin bridge, take 1.5 tablet instead of 1 tablet tomorrow, RTC Monday  There are no Patient Instructions on file for this visit. Patient advised to contact clinic or seek medical attention if  signs/symptoms of bleeding or thromboembolism occur.  Patient verbalized understanding by repeating back information and was advised to contact me if further medication-related questions arise. Patient was also provided an information handout.  Follow-up 4 days  Flossie Dibble  15 minutes spent face-to-face with the patient during the encounter. 50% of time spent on education, including signs/sx bleeding and clotting, as well as food and drug interactions with warfarin. 50% of time was spent on fingerprick POC INR sample collection,processing, results determination, and documentation in [email or web address]

## 2018-08-12 NOTE — Progress Notes (Signed)
Reviewed Thanks DRG

## 2018-08-12 NOTE — Patient Instructions (Signed)
Patient educated about medication as defined in this encounter and verbalized understanding by repeating back instructions provided.   

## 2018-08-16 ENCOUNTER — Encounter: Payer: Self-pay | Admitting: Pharmacist

## 2018-08-16 ENCOUNTER — Ambulatory Visit (INDEPENDENT_AMBULATORY_CARE_PROVIDER_SITE_OTHER): Payer: 59 | Admitting: Pharmacist

## 2018-08-16 ENCOUNTER — Other Ambulatory Visit: Payer: Self-pay | Admitting: Internal Medicine

## 2018-08-16 DIAGNOSIS — I742 Embolism and thrombosis of arteries of the upper extremities: Secondary | ICD-10-CM

## 2018-08-16 DIAGNOSIS — D6859 Other primary thrombophilia: Secondary | ICD-10-CM | POA: Diagnosis not present

## 2018-08-16 DIAGNOSIS — Z86718 Personal history of other venous thrombosis and embolism: Secondary | ICD-10-CM

## 2018-08-16 DIAGNOSIS — Z7901 Long term (current) use of anticoagulants: Secondary | ICD-10-CM

## 2018-08-16 DIAGNOSIS — Z5181 Encounter for therapeutic drug level monitoring: Secondary | ICD-10-CM

## 2018-08-16 LAB — POCT INR: INR: 2.3 (ref 2.0–3.0)

## 2018-08-16 NOTE — Patient Instructions (Signed)
Patient instructed to take medications as defined in the Anti-coagulation Track section of this encounter.  Patient instructed to take today's dose.  Patient instructed to take  one-and one-half (1 &1/2) tablets of your 5mg  peach-colored warfarin tablets by mouth every day of the week EXCEPT take one (1) tablet on [facility].  Patient verbalized understanding of these instructions.

## 2018-08-16 NOTE — Progress Notes (Signed)
Anticoagulation Management Tracy Peters is a 85 y.o. female who reports to the clinic for monitoring of warfarin treatment.    Indication: Chronic anticoagulation; History of right radial artery thrombosis; Primary hypercoagulable state; long term current use of anticoagulant.    Duration: indefinite Supervising physician: Murriel Hopper  Anticoagulation Clinic Visit History: Patient does not report signs/symptoms of bleeding or thromboembolism  Other recent changes: No diet, medications, lifestyle except as noted in patient findings.  Anticoagulation Episode Summary    Current INR goal:   2.0-3.0  TTR:   70.4 % (7.1 y)  Next INR check:   09/13/2018  INR from last check:   2.3 (08/16/2018)  Weekly max warfarin dose:     Target end date:   Indefinite  INR check location:   Anticoagulation Clinic  Preferred lab:     Send INR reminders to:   ANTICOAG IMP   Indications   Chronic anticoagulation [Z79.01] DVT HX OF [Z86.718] 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HCC) [I74.2] Primary hypercoagulable state ([place]) [D68.59] [D68.59] Long term (current) use of anticoagulants [Z79.01] [Z79.01]       Comments:         Anticoagulation Care Providers    Provider Role Specialty Phone number   Annia Belt, MD Referring Oncology [phone]      Allergies  Allergen Reactions  . Bee Venom Anaphylaxis and Other (See Comments)    Yellow jackets, wasps as well  . Ivp Dye [Iodinated Diagnostic Agents] Anaphylaxis  . Shellfish Allergy Anaphylaxis  . Temazepam Other (See Comments)    Severe Hallucinations and Paranoia  . Amlodipine Besylate Swelling  . Aspirin Other (See Comments)    REACTION: break out in sweats  . Atorvastatin Diarrhea  . Cholestyramine Other (See Comments)    REACTION: mouth irritation  . Codeine Phosphate Nausea And Vomiting  . Demerol [Meperidine] Other (See Comments)    Severe Hallucination!!!!  . Diphenhydramine Hcl Other (See Comments)   hyperactive  . Doxycycline Nausea Only  . Gentamicin Sulfate Other (See Comments)    REACTION: Shortness of breath   . Keppra [Levetiracetam]     Made her too groggy/ sleepy  . Meclizine Hcl Other (See Comments)    REACTION: more dizziness on it  . Meperidine Hcl Other (See Comments)    Unknown  . Metronidazole Other (See Comments)    REACTION: bad taste  . Moxifloxacin Other (See Comments)    REACTION: insomnia Able to use Cipro  . Sulfamethoxazole Other (See Comments)    Kidney problems  . Penicillins Swelling, Rash and Other (See Comments)    Has patient had a PCN reaction causing immediate rash, facial/tongue/throat swelling, SOB or lightheadedness with hypotension: Yes Has patient had a PCN reaction causing severe rash involving mucus membranes or skin necrosis: No Has patient had a PCN reaction that required hospitalization: No Has patient had a PCN reaction occurring within the last 10 years: No If all of the above answers are "NO", then may proceed with Cephalosporin use.    Prior to Admission medications   Medication Sig Start Date End Date Taking? Authorizing Provider  colesevelam (WELCHOL) 625 MG tablet Take 2 tablets (1,250 mg total) by mouth 2 (two) times daily with a meal. 04/20/18 04/20/19 Yes Plotnikov, Evie Lacks, MD  diphenoxylate-atropine (LOMOTIL) 2.5-0.025 MG tablet Take 1-2 tablets by mouth 4 (four) times daily as needed for diarrhea or loose stools. 05/19/18  Yes Plotnikov, Evie Lacks, MD  EPINEPHrine 0.3 mg/0.3 mL IJ SOAJ injection As  dirrected 01/27/17  Yes Plotnikov, Evie Lacks, MD  ferrous sulfate 325 (65 FE) MG tablet Take 1 tablet (325 mg total) by mouth 3 (three) times daily after meals. Patient taking differently: Take 325 mg by mouth every other day.  06/18/17  Yes Babish, Rodman Key, PA-C  fluticasone (FLONASE) 50 MCG/ACT nasal spray Place 2 sprays into both nostrils daily. Patient taking differently: Place 2 sprays into both nostrils daily as needed for  allergies or rhinitis.  07/29/17  Yes Plotnikov, Evie Lacks, MD  HYDROcodone-acetaminophen (NORCO) 5-325 MG tablet Take 1 tablet by mouth every 6 (six) hours as needed for moderate pain or severe pain. 08/06/18  Yes Fanny Skates, MD  lacosamide (VIMPAT) 50 MG TABS tablet Take 1 tablet (50 mg total) by mouth 2 (two) times daily. 07/07/18  Yes Penumalli, Earlean Polka, MD  losartan (COZAAR) 100 MG tablet Take 0.5 tablets (50 mg total) by mouth daily. 01/12/18  Yes Plotnikov, Evie Lacks, MD  Probiotic Product (ALIGN PO) Take by mouth. One cap by mouth Bid.   Yes [provider]  valACYclovir (VALTREX) 1000 MG tablet TAKE 1 TABLET BY MOUTH TWICE DAILY. 02/07/18  Yes Plotnikov, Evie Lacks, MD  warfarin (COUMADIN) 5 MG tablet Take 1 and 1/2 tablets on Sundays, Tuesdays, Thursdays and Saturdays. All other days, take only one (1) tablet. 07/12/18  Yes Pennie Banter, RPH-CPP  zolpidem (AMBIEN) 10 MG tablet 1 & 1/2 TABLETS (15MG ) BY MOUTH AT BEDTIME AS NEEDED FOR SLEEP, OK TOREPEAT IN 4HRS 03/05/18  Yes Plotnikov, Evie Lacks, MD  enoxaparin (LOVENOX) 100 MG/ML injection Inject 0.9 mLs (90 mg total) into the skin daily. Patient not taking: Reported on 08/16/2018 08/02/18   Pennie Banter, RPH-CPP   Past Medical History:  Diagnosis Date  . Adenomatous colon polyp   . Allergic rhinitis    uses FLonase nightly  . Anemia   . Arthritis    joint pain  . Blood transfusion   . Bronchitis    hx of-7-69yrs ago  . Carpal tunnel syndrome    numbness and tingling   . Cataracts, bilateral   . Chronic anticoagulation 03/21/2013  . Congenital absence of one kidney    pt only has one kidney  . Diarrhea    takes Immodium daily as needed or Lomotil   . Diverticulosis   . GERD (gastroesophageal reflux disease)    mild  . Herpes    takes Valtrex daily  . History of colon polyps   . History of DVT (deep vein thrombosis) 1998   right arm  . History of staph infection   . Hyperlipidemia    Medical MD doesn't think its  absolutely necessary  . Hypertension    takes Losartan daily  . IBS (irritable bowel syndrome)    takes Electronics engineer daily  . Insomnia    takes Ambien nightly as needed  . LBP (low back pain)   . Long term (current) use of anticoagulants    Dr. Beryle Beams  . Nocturia   . Osteoporosis   . Raynaud's disease   . 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 Winfield Cunas  . Seizures ([place]) 10/20/2017   has not had a seizure since may 2019  . 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[place]) 09/16/2011   Right digital artery 1998 idiopathic   . Urinary leakage   . UTI (urinary tract infection)    Social History   Socioeconomic History  . Marital status: Married    Spouse name: Not  on file  . Number of children: 2  . Years of education: Not on file  . Highest education level: Not on file  Occupational History  . Occupation: Retired    Fish farm manager: RETIRED  Social Needs  . Financial resource strain: Not on file  . Food insecurity:    Worry: Not on file    Inability: Not on file  . Transportation needs:    Medical: No    Non-medical: No  Tobacco Use  . Smoking status: Never Smoker  . Smokeless tobacco: Never Used  Substance and Sexual Activity  . Alcohol use: No    Alcohol/week: 0.0 standard drinks  . Drug use: No  . Sexual activity: Never  Lifestyle  . Physical activity:    Days per week: Not on file    Minutes per session: Not on file  . Stress: Not on file  Relationships  . Social connections:    Talks on phone: Not on file    Gets together: Not on file    Attends religious service: Not on file    Active member of club or organization: Not on file    Attends meetings of clubs or organizations: Not on file    Relationship status: Not on file  Other Topics Concern  . Not on file  Social History Narrative   Lives home with husband.  Education- graduate level.  Children 2.     Family History  Problem Relation Age of Onset  . Breast cancer Sister   . Kidney disease Sister 55        nephritis  . Parkinsonism Mother   . Heart disease Brother   . Osteoarthritis Other   . Ulcerative colitis Sister   . Kidney disease Sister   . Colon cancer Neg Hx     ASSESSMENT Recent Results: The most recent result is correlated with 50 mg per week: Lab Results  Component Value Date   INR 2.3 08/16/2018   INR 1.7 (A) 08/12/2018   INR 1.5 (A) 08/09/2018   PROTIME 39.6 (H) 04/03/2015    Anticoagulation Dosing: Description   Take one-and one-half (1 &1/2) tablets of your 5mg  peach-colored warfarin tablets by mouth every day of the week EXCEPT take one (1) tablet on [facility]  DR Azucena Freed PATIENT (route warfarin notes and list as authorizing provider for LOS & Follow-up, lab orders and warfarin prescriptions), do not list attending physician       INR today: Therapeutic  PLAN Weekly dose was unchanged. Patient to remain on 50mg  warfarin per week.   Patient Instructions  Patient instructed to take medications as defined in the Anti-coagulation Track section of this encounter.  Patient instructed to take today's dose.  Patient instructed to take  one-and one-half (1 &1/2) tablets of your 5mg  peach-colored warfarin tablets by mouth every day of the week EXCEPT take one (1) tablet on [facility].  Patient verbalized understanding of these instructions.     Patient advised to contact clinic or seek medical attention if signs/symptoms of bleeding or thromboembolism occur.  Patient verbalized understanding by repeating back information and was advised to contact me if further medication-related questions arise. Patient was also provided an information handout.  Follow-up Return in 4 weeks (on 09/13/2018) for Follow up INR at [place], PharmD. CPP  15 minutes spent face-to-face with the patient during the encounter. 50% of time spent on education, including signs/sx bleeding and clotting, as well as food and drug interactions with warfarin. 50%  of time was  spent on fingerprick POC INR sample collection,processing, results determination, and documentation in [email or web address]

## 2018-08-17 NOTE — Progress Notes (Signed)
Reviewed Thanks DrG  Please let patient know that Dr Alain Marion, her primary care, has a coumadin clinic in his office and has agreed to monitor her warfarin effective immediately.

## 2018-08-19 ENCOUNTER — Ambulatory Visit: Payer: 59 | Admitting: Internal Medicine

## 2018-08-23 ENCOUNTER — Ambulatory Visit: Payer: 59

## 2018-08-25 ENCOUNTER — Other Ambulatory Visit: Payer: Self-pay

## 2018-08-25 ENCOUNTER — Ambulatory Visit
Admission: RE | Admit: 2018-08-25 | Discharge: 2018-08-25 | Disposition: A | Discharge status: Home / Self Care | Payer: 59 | Source: Ambulatory Visit | Attending: Radiation Oncology | Admitting: Radiation Oncology

## 2018-08-25 ENCOUNTER — Encounter: Payer: Self-pay | Admitting: Radiation Oncology

## 2018-08-25 VITALS — BP 200/104 | HR 78 | Temp 98.0°F | Resp 20 | Ht 65.0 in | Wt 133.6 lb

## 2018-08-25 DIAGNOSIS — Z79899 Other long term (current) drug therapy: Secondary | ICD-10-CM | POA: Insufficient documentation

## 2018-08-25 DIAGNOSIS — D0512 Intraductal carcinoma in situ of left breast: Secondary | ICD-10-CM | POA: Diagnosis not present

## 2018-08-25 DIAGNOSIS — E785 Hyperlipidemia, unspecified: Secondary | ICD-10-CM | POA: Insufficient documentation

## 2018-08-25 DIAGNOSIS — Z86718 Personal history of other venous thrombosis and embolism: Secondary | ICD-10-CM | POA: Insufficient documentation

## 2018-08-25 DIAGNOSIS — Z803 Family history of malignant neoplasm of breast: Secondary | ICD-10-CM | POA: Insufficient documentation

## 2018-08-25 DIAGNOSIS — K580 Irritable bowel syndrome with diarrhea: Secondary | ICD-10-CM | POA: Insufficient documentation

## 2018-08-25 DIAGNOSIS — Z9889 Other specified postprocedural states: Secondary | ICD-10-CM | POA: Diagnosis not present

## 2018-08-25 DIAGNOSIS — C50312 Malignant neoplasm of lower-inner quadrant of left female breast: Secondary | ICD-10-CM

## 2018-08-25 DIAGNOSIS — Z170 Estrogen receptor positive status [ER+]: Secondary | ICD-10-CM | POA: Diagnosis not present

## 2018-08-25 DIAGNOSIS — M545 Low back pain: Secondary | ICD-10-CM | POA: Insufficient documentation

## 2018-08-25 DIAGNOSIS — Z8619 Personal history of other infectious and parasitic diseases: Secondary | ICD-10-CM | POA: Insufficient documentation

## 2018-08-25 DIAGNOSIS — Z510 Encounter for antineoplastic radiation therapy: Secondary | ICD-10-CM | POA: Diagnosis not present

## 2018-08-25 DIAGNOSIS — I7300 Raynaud's syndrome without gangrene: Secondary | ICD-10-CM | POA: Diagnosis not present

## 2018-08-25 DIAGNOSIS — Z86010 Personal history of colonic polyps: Secondary | ICD-10-CM | POA: Insufficient documentation

## 2018-08-25 DIAGNOSIS — G5600 Carpal tunnel syndrome, unspecified upper limb: Secondary | ICD-10-CM | POA: Diagnosis not present

## 2018-08-25 DIAGNOSIS — Z7901 Long term (current) use of anticoagulants: Secondary | ICD-10-CM | POA: Diagnosis not present

## 2018-08-25 DIAGNOSIS — M810 Age-related osteoporosis without current pathological fracture: Secondary | ICD-10-CM | POA: Diagnosis not present

## 2018-08-25 DIAGNOSIS — M129 Arthropathy, unspecified: Secondary | ICD-10-CM | POA: Diagnosis not present

## 2018-08-25 DIAGNOSIS — R351 Nocturia: Secondary | ICD-10-CM | POA: Diagnosis not present

## 2018-08-25 DIAGNOSIS — I10 Essential (primary) hypertension: Secondary | ICD-10-CM | POA: Insufficient documentation

## 2018-08-25 DIAGNOSIS — K219 Gastro-esophageal reflux disease without esophagitis: Secondary | ICD-10-CM | POA: Diagnosis not present

## 2018-08-25 DIAGNOSIS — G4700 Insomnia, unspecified: Secondary | ICD-10-CM | POA: Insufficient documentation

## 2018-08-25 NOTE — Progress Notes (Signed)
  Radiation Oncology         [phone] ________________________________  Name: Tracy Peters MRN: [id number]  Date: 08/25/2018  DOB: Oct 08, 1933   DIAGNOSIS:     ICD-10-CM   1. Ductal carcinoma in situ (DCIS) of left breast D05.12     SIMULATION AND TREATMENT PLANNING NOTE  The patient presented for simulation prior to beginning her course of radiation treatment for her diagnosis of left-sided breast cancer. The patient was placed in a supine position on a breast board. A customized vac-lock bag was constructed and this complex treatment device will be used on a daily basis during her treatment. In this fashion, a CT scan was obtained through the chest area and an isocenter was placed near the chest wall within the breast.  The patient will be planned to receive a course of radiation initially to a dose of 42.56 Gy. This will consist of a whole breast radiotherapy technique. To accomplish this, 2 customized blocks have been designed which will correspond to medial and lateral whole breast tangent fields. This treatment will be accomplished at 2.66 Gy per fraction. A forward planning technique will also be evaluated to determine if this approach improves the plan. It is anticipated that the patient will then receive a 8 Gy boost to the seroma cavity which has been contoured. This will be accomplished at 2 Gy per fraction.   This initial treatment will consist of a 3-D conformal technique. The seroma has been contoured as the primary target structure. Additionally, dose volume histograms of both this target as well as the lungs and heart will also be evaluated. Such an approach is necessary to ensure that the target area is adequately covered while the nearby critical  normal structures are adequately spared.  Plan:  The final anticipated total dose therefore will correspond to 50.56 Gy.   Special treatment procedure was performed today due to the extra time and effort required by  myself to plan and prepare this patient for deep inspiration breath hold technique.  I have determined cardiac sparing to be of benefit to this patient to prevent long term cardiac damage due to radiation of the heart.  Bellows were placed on the patient's abdomen. To facilitate cardiac sparing, the patient was coached by the radiation therapists on breath hold techniques and breathing practice was performed. Practice waveforms were obtained. The patient was then scanned while maintaining breath hold in the treatment position.  This image was then transferred over to the imaging specialist. The imaging specialist then created a fusion of the free breathing and breath hold scans using the chest wall as the stable structure. I personally reviewed the fusion in axial, coronal and sagittal image planes.  Excellent cardiac sparing was obtained.  I felt the patient is an appropriate candidate for breath hold and the patient will be treated as such.  The image fusion was then reviewed with the patient to reinforce the necessity of reproducible breath hold.     _______________________________   Jodelle Gross, MD, PhD

## 2018-08-25 NOTE — Progress Notes (Signed)
  Radiation Oncology         [phone] ________________________________  Name: Tracy Peters MRN: [id number]  Date: 08/25/2018  DOB: 1933-10-28  Optical Surface Tracking Plan:  Since intensity modulated radiotherapy (IMRT) and 3D conformal radiation treatment methods are predicated on accurate and precise positioning for treatment, intrafraction motion monitoring is medically necessary to ensure accurate and safe treatment delivery.  The ability to quantify intrafraction motion without excessive ionizing radiation dose can only be performed with optical surface tracking. Accordingly, surface imaging offers the opportunity to obtain 3D measurements of patient position throughout IMRT and 3D treatments without excessive radiation exposure.  I am ordering optical surface tracking for this patient's upcoming course of radiotherapy. ________________________________  Kyung Rudd, MD 08/25/2018 8:14 PM    Reference:   Particia Jasper, et al. Surface imaging-based analysis of intrafraction motion for breast radiotherapy patients.Journal of [place], n. 6, nov. 2014. ISSN [id number].   Available at: <http://www.jacmp.org/index.php/jacmp/article/view/4957>.

## 2018-08-25 NOTE — Progress Notes (Addendum)
Radiation Oncology         [phone] ________________________________  Name: Tracy Peters        MRN: [id number]  Date of Service: 08/25/2018 DOB: 06/20/1933  OZ:[id number], Evie Lacks, MD  Fanny Skates, MD     REFERRING PHYSICIAN: Fanny Skates, MD   DIAGNOSIS: The primary encounter diagnosis was Malignant neoplasm of lower-inner quadrant of left breast in female, estrogen receptor positive ([place]). A diagnosis of Ductal carcinoma in situ (DCIS) of left breast was also pertinent to this visit.   HISTORY OF PRESENT ILLNESS: Tracy Peters is a 85 y.o. female for a diagnosis of left breast cancer. The patient was noted to have screening detected calcifications of the left breast. Diagnostic imaging revealed a 7 mm group of amorphous calcifications in the LIQ of the left breast. She underwent tomo guided biopsy on 07/09/2018 that revealed a low grade DCIS that was ER/PR positive. She is scheduled to undergo lumpectomy on Friday of this week. She underwent left lumpectomy on 08/06/2018 that only revealed fibrocystic changes with calcifications and no residual disease. She has found out that she would be high risk for clotting while on antiestrogen therapy. She also found out recently that she had a second sister who had breast cancer.   PREVIOUS RADIATION THERAPY: No   PAST MEDICAL HISTORY:  Past Medical History:  Diagnosis Date  . Adenomatous colon polyp   . Allergic rhinitis    uses FLonase nightly  . Anemia   . Arthritis    joint pain  . Blood transfusion   . Bronchitis    hx of-7-15yr ago  . Carpal tunnel syndrome    numbness and tingling   . Cataracts, bilateral   . Chronic anticoagulation 03/21/2013  . Congenital absence of one kidney    pt only has one kidney  . Diarrhea    takes Immodium daily as needed or Lomotil   . Diverticulosis   . GERD (gastroesophageal reflux disease)    mild  . Herpes    takes Valtrex daily  . History of colon polyps   .  History of DVT (deep vein thrombosis) 1998   right arm  . History of staph infection   . Hyperlipidemia    Medical MD doesn't think its absolutely necessary  . Hypertension    takes Losartan daily  . IBS (irritable bowel syndrome)    takes AElectronics engineerdaily  . Insomnia    takes Ambien nightly as needed  . LBP (low back pain)   . Long term (current) use of anticoagulants    Dr. GBeryle Beams . Nocturia   . Osteoporosis   . Raynaud's disease   . Sar[place] ([place]    Dr. AWinfield Cunas . [place]   has not had a seizure since may 2019  . 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 Urinary leakage   . UTI (urinary tract infection)        PAST SURGICAL HISTORY: Past Surgical History:  Procedure Laterality Date  .  kidney disease left     . ABDOMINAL HYSTERECTOMY    . ANTERIOR CERVICAL DECOMP/DISCECTOMY FUSION N/A 01/18/2014   Procedure: ANTERIOR CERVICAL DECOMPRESSION/DISCECTOMY FUSION 3 LEVELS  Cervical  four/five, five/six, six/seven anterior cervical decompression with fusion interbody prosthesis with plating and bonegraft;  Surgeon: JNewman Pies MD;  [facility];  Service: Neurosurgery;  Laterality: N/A;  t  .  APPENDECTOMY    . BACK SURGERY     2  . BREAST BIOPSY Left 30+ yrs ago   benign  . BREAST LUMPECTOMY WITH RADIOACTIVE SEED LOCALIZATION Left 08/06/2018   Procedure: LEFT BREAST LUMPECTOMY WITH RADIOACTIVE SEED LOCALIZATION;  Surgeon: Fanny Skates, MD;  Location: [facility];  Service: General;  Laterality: Left;  . BREAST SURGERY     cystectomy-benign  . CARPAL TUNNEL RELEASE Right 06/12/2014   Procedure: CARPAL TUNNEL RELEASE;  Surgeon: Newman Pies, MD;  Location: [facility];  Service: Neurosurgery;  Laterality: Right;  Right Carpal Tunnel Release  . CATARACT EXTRACTION Bilateral 02/15/2015  . CHOLECYSTECTOMY    . COLONOSCOPY    . [place]  . HIP CLOSED REDUCTION Right 08/23/2017   Procedure: CLOSED MANIPULATION HIP;  Surgeon: Nicholes Stairs, MD;  Location: [facility];  Service: Orthopedics;  Laterality: Right;  . I&D of abdomen  1988   couple of wks after gallbladder removed  . KNEE SURGERY     left x 3  . LUMBAR LAMINECTOMY     X 2  . mortons neuromas removed    . OPEN SURGICAL REPAIR OF GLUTEAL TENDON Right 06/15/2017   Procedure: OPEN SURGICAL REPAIR OF GLUTEALmedius TENDON;  Surgeon: Paralee Cancel, MD;  Location: [facility];  Service: Orthopedics;  Laterality: Right;  . SHOULDER SURGERY     Right  . TOTAL HIP ARTHROPLASTY Right 06/15/2017   Procedure: Right total hip arthroplasty, bursectomy, repair of gluteal tendon, posterior approach;  Surgeon: Paralee Cancel, MD;  Location: [facility];  Service: Orthopedics;  Laterality: Right;  90 mins for all procedures together     FAMILY HISTORY:  Family History  Problem Relation Age of Onset  . Breast cancer Sister   . Kidney disease Sister 36       nephritis  . Parkinsonism Mother   . Heart disease Brother   . Osteoarthritis Other   . Ulcerative colitis Sister   . Kidney disease Sister   . Colon cancer Neg Hx      SOCIAL HISTORY:  reports that she has never smoked. She has never used smokeless tobacco. She reports that she does not drink alcohol or use drugs. The patient is married and lives in [place].   ALLERGIES: Bee venom; Ivp dye [iodinated diagnostic agents]; Shellfish allergy; Temazepam; Amlodipine besylate; Aspirin; Atorvastatin; Cholestyramine; Codeine phosphate; Demerol [meperidine]; Diphenhydramine hcl; Doxycycline; Gentamicin sulfate; Keppra [levetiracetam]; Meclizine hcl; Meperidine hcl; Metronidazole; Moxifloxacin; Sulfamethoxazole; and Penicillins   MEDICATIONS:  Current Outpatient Medications  Medication Sig Dispense Refill  . colesevelam (WELCHOL) 625 MG tablet Take 2 tablets (1,250 mg total) by mouth 2 (two) times daily with a meal.  120 tablet 11  . diphenoxylate-atropine (LOMOTIL) 2.5-0.025 MG tablet Take 1-2 tablets by mouth 4 (four) times daily as needed for diarrhea or loose stools. 120 tablet 2  . ferrous sulfate 325 (65 FE) MG tablet Take 1 tablet (325 mg total) by mouth 3 (three) times daily after meals. (Patient taking differently: Take 325 mg by mouth every other day. )  3  . fluticasone (FLONASE) 50 MCG/ACT nasal spray Place 2 sprays into both nostrils daily. (Patient taking differently: Place 2 sprays into both nostrils daily as needed for allergies or rhinitis. ) 48 g 3  . lacosamide (VIMPAT) 50 MG TABS tablet Take 1 tablet (50 mg total) by mouth 2 (two) times daily. 60 tablet 5  . losartan (COZAAR) 100 MG tablet Take 0.5 tablets (50  mg total) by mouth daily. 90 tablet 3  . Probiotic Product (ALIGN PO) Take by mouth. One cap by mouth Bid.    . valACYclovir (VALTREX) 1000 MG tablet TAKE 1 TABLET BY MOUTH TWICE DAILY. (Patient taking differently: 1,000 mg 2 (two) times daily. ) 180 tablet 1  . warfarin (COUMADIN) 5 MG tablet Take 1 and 1/2 tablets on Sundays, Tuesdays, Thursdays and Saturdays. All other days, take only one (1) tablet. 108 tablet 1  . zolpidem (AMBIEN) 10 MG tablet 1 & 1/2 TABLETS (15MG) BY MOUTH AT BEDTIME AS NEEDED FOR SLEEP, OK TOREPEAT IN 4HRS 135 tablet 1  . enoxaparin (LOVENOX) 100 MG/ML injection Inject 0.9 mLs (90 mg total) into the skin daily. (Patient not taking: Reported on 08/16/2018) 10 Syringe 1  . EPINEPHrine 0.3 mg/0.3 mL IJ SOAJ injection As dirrected (Patient not taking: Reported on 08/25/2018) 1 Device 3  . HYDROcodone-acetaminophen (NORCO) 5-325 MG tablet Take 1 tablet by mouth every 6 (six) hours as needed for moderate pain or severe pain. (Patient not taking: Reported on 08/25/2018) 15 tablet 0   No current facility-administered medications for this encounter.      REVIEW OF SYSTEMS: On review of systems, the patient reports that she is doing well overall. She denies any chest pain,  shortness of breath, cough, fevers, chills, night sweats, unintended weight changes. She reports she is tired but healin well overall. She denies any bowel or bladder disturbances, and denies abdominal pain, nausea or vomiting. She denies any new musculoskeletal or joint aches or pains, new skin lesions or concerns. She continues to have right hip pain but this is chronic. A complete review of systems is obtained and is otherwise negative. Marland Kitchen     PHYSICAL EXAM:  Wt Readings from Last 3 Encounters:  08/25/18 133 lb 9.6 oz (60.6 kg)  08/09/18 138 lb 14.4 oz (63 kg)  08/06/18 133 lb 9.6 oz (60.6 kg)   Temp Readings from Last 3 Encounters:  08/25/18 98 F (36.7 C) (Oral)  08/09/18 98.2 F (36.8 C) (Oral)  08/06/18 97.9 F (36.6 C)   BP Readings from Last 3 Encounters:  08/25/18 (!) 200/104  08/09/18 (!) 160/79  08/06/18 (!) 170/86   Pulse Readings from Last 3 Encounters:  08/25/18 78  08/09/18 84  08/06/18 79     In general this is a well appearing caucasian female in no acute distress. She is alert and oriented x4 and appropriate throughout the examination. HEENT reveals that the patient is normocephalic, atraumatic. EOMs are intact. Skin is intact without any evidence of gross lesions.Cardiopulmonary assessment is negative for acute distress and she exhibits normal effort. The left lumpectomy site is intact without erythema. No edema is noted.  ECOG = 0  0 - Asymptomatic (Fully active, able to carry on all predisease activities without restriction)  1 - Symptomatic but completely ambulatory (Restricted in physically strenuous activity but ambulatory and able to carry out work of a light or sedentary nature. For example, light housework, office work)  2 - Symptomatic, <50% in bed during the day (Ambulatory and capable of all self care but unable to carry out any work activities. Up and about more than 50% of waking hours)  3 - Symptomatic, >50% in bed, but not bedbound (Capable  of only limited self-care, confined to bed or chair 50% or more of waking hours)  4 - Bedbound (Completely disabled. Cannot carry on any self-care. Totally confined to bed or chair)  5 - Death  Oken MM, Creech RH, Tormey DC, et al. [phone]). "Toxicity and response criteria of the [facility] Group". [place] Oncol. 5 (6): 649-55    LABORATORY DATA:  Lab Results  Component Value Date   WBC 7.1 08/05/2018   HGB 12.4 08/05/2018   HCT 37.5 08/05/2018   MCV 106.5 (H) 08/05/2018   PLT 290 08/05/2018   Lab Results  Component Value Date   NA 130 (L) 08/05/2018   K 4.8 08/05/2018   CL 95 (L) 08/05/2018   CO2 23 08/05/2018   Lab Results  Component Value Date   ALT 21 08/05/2018   AST 26 08/05/2018   ALKPHOS 46 08/05/2018   BILITOT 0.7 08/05/2018      RADIOGRAPHY: Mm Breast Surgical Specimen  Result Date: 08/06/2018 CLINICAL DATA:  Evaluate specimen EXAM: SPECIMEN RADIOGRAPH OF THE LEFT BREAST COMPARISON:  Previous exam(s). FINDINGS: Status post excision of the left breast. The radioactive seed and biopsy marker clip are present, completely intact, and were marked for pathology. IMPRESSION: Specimen radiograph of the left breast. Electronically Signed   By: Dorise Bullion III M.D   On: 08/06/2018 12:01   Mm Lt Radioactive Seed Loc Mammo Guide  Result Date: 08/05/2018 CLINICAL DATA:  Localization prior to surgery EXAM: MAMMOGRAPHIC GUIDED RADIOACTIVE SEED LOCALIZATION OF THE LEFT BREAST COMPARISON:  Previous exam(s). FINDINGS: Patient presents for radioactive seed localization prior to surgery. I met with the patient and we discussed the procedure of seed localization including benefits and alternatives. We discussed the high likelihood of a successful procedure. We discussed the risks of the procedure including infection, bleeding, tissue injury and further surgery. We discussed the low dose of radioactivity involved in the procedure. Informed, written consent was  given. The usual time-out protocol was performed immediately prior to the procedure. Using mammographic guidance, sterile technique, 1% lidocaine and an I-125 radioactive seed, the biopsy clip was localized using a medial approach. The follow-up mammogram images confirm the seed in the expected location and were marked for the surgeon. Follow-up survey of the patient confirms presence of the radioactive seed. Order number of I-125 seed:  [id number]. Total activity:  2.263 millicuries reference Date: July 19, 2018 The patient tolerated the procedure well and was released from the [place]. She was given instructions regarding seed removal. IMPRESSION: Radioactive seed localization left breast. No apparent complications. Electronically Signed   By: Dorise Bullion III M.D   On: 08/05/2018 14:05       IMPRESSION/PLAN: 1. Low grade ER positive DCIS of the left breast. Dr. Lisbeth Renshaw discusses the final pathology findings and reviews the nature of left noninvasive breast disease. We discussed that while she would meet criteria to consider forgoing radiation treatment given the low grade nature of her tumor, she is not a candidate for antiestrogen therapy due to the risk of clotting, and hence she would like to be aggressive with local treatment. We discussed the risks, benefits, short, and long term effects of radiotherapy. Dr. Lisbeth Renshaw discusses the delivery and logistics of radiotherapy and recommends a course of 4 weeks of radiotherapy. Written consent is obtained and placed in the chart, a copy was provided to the patient. She will simulate today.  2. Possible genetic predisposition to malignancy. The patient is a candidate for genetic testing given her personal and new found family history. She was offered referral and is interested. We will refer her to genetics clinic.   In a visit lasting 45 minutes, greater than 50% of the  time was spent face to face discussing her case, and coordinating the patient's  care.  The above documentation reflects my direct findings during this shared patient visit. Please see the separate note by Dr. Lisbeth Renshaw on this date for the remainder of the patient's plan of care.    Carola Rhine, PAC

## 2018-08-25 NOTE — Progress Notes (Addendum)
Location of Breast Cancer: left breast  Histology per Pathology Report:07/09/18   Diagnosis Breast, left, needle core biopsy, LIQ posterior - DUCTAL CARCINOMA IN SITU, LOW NUCLEAR GRADE WITH CALCIFICATIONS. SEE NOTE  Receptor Status: 07/09/18  Estrogen Receptor: 100%, POSITIVE, STRONG STAINING INTENSITY Progesterone Receptor: 10%, POSITIVE, MODERATE STAINING INTENSITY  Did patient present with symptoms (if so, please note symptoms) or was this found on screening mammography?: Yes  Past/Anticipated interventions by surgeon, if any: 08/06/18 Dr. Dalbert Batman  LEFT BREAST LUMPECTOMY WITH RADIOACTIVE SEED LOCALIZATION Past/Anticipated interventions by medical oncology, if any: Chemotherapy  Lymphedema issues, if any:  None  Pain issues, if any:   None  SAFETY ISSUES:  Prior radiation?  None  Pacemaker/ICD?  None  Possible current pregnancy? None  Is the patient on methotrexate?  None  Current Complaints / other details:    Patients blood pressure is elevated today. Patient denies any chest pains ,headaches or numbness or tingling in her extremities. Patient states that she has not taken her blood pressure medication this moring. States that she will take her medication when she gets home .  Wt Readings from Last 3 Encounters:  08/25/18 133 lb 9.6 oz (60.6 kg)  08/09/18 138 lb 14.4 oz (63 kg)  08/06/18 133 lb 9.6 oz (60.6 kg)   Vitals:   08/25/18 0912  BP: (!) 200/104  Pulse: 78  Resp: 20  Temp: 98 F (36.7 C)  TempSrc: Oral  SpO2: 100%  Weight: 133 lb 9.6 oz (60.6 kg)  Height: 5\' 5"  (1.651 m)    Mardene Sayer, LPN 2/44/9753,0:05 AM

## 2018-08-26 ENCOUNTER — Ambulatory Visit (INDEPENDENT_AMBULATORY_CARE_PROVIDER_SITE_OTHER): Payer: 59 | Admitting: Internal Medicine

## 2018-08-26 ENCOUNTER — Other Ambulatory Visit (INDEPENDENT_AMBULATORY_CARE_PROVIDER_SITE_OTHER): Payer: 59

## 2018-08-26 ENCOUNTER — Encounter: Payer: Self-pay | Admitting: Internal Medicine

## 2018-08-26 ENCOUNTER — Telehealth: Payer: Self-pay | Admitting: Oncology

## 2018-08-26 VITALS — BP 156/88 | HR 104 | Temp 97.4°F | Ht 65.0 in | Wt 133.0 lb

## 2018-08-26 DIAGNOSIS — H9190 Unspecified hearing loss, unspecified ear: Secondary | ICD-10-CM | POA: Insufficient documentation

## 2018-08-26 DIAGNOSIS — H9193 Unspecified hearing loss, bilateral: Secondary | ICD-10-CM

## 2018-08-26 DIAGNOSIS — I10 Essential (primary) hypertension: Secondary | ICD-10-CM

## 2018-08-26 DIAGNOSIS — I742 Embolism and thrombosis of arteries of the upper extremities: Secondary | ICD-10-CM

## 2018-08-26 DIAGNOSIS — D6859 Other primary thrombophilia: Secondary | ICD-10-CM

## 2018-08-26 DIAGNOSIS — Z7901 Long term (current) use of anticoagulants: Secondary | ICD-10-CM

## 2018-08-26 DIAGNOSIS — R569 Unspecified convulsions: Secondary | ICD-10-CM | POA: Diagnosis not present

## 2018-08-26 DIAGNOSIS — R197 Diarrhea, unspecified: Secondary | ICD-10-CM

## 2018-08-26 DIAGNOSIS — D0512 Intraductal carcinoma in situ of left breast: Secondary | ICD-10-CM | POA: Diagnosis not present

## 2018-08-26 LAB — BASIC METABOLIC PANEL
BUN: 20 mg/dL (ref 6–23)
CO2: 25 meq/L (ref 19–32)
Calcium: 9.6 mg/dL (ref 8.4–10.5)
Chloride: 98 mEq/L (ref 96–112)
Creatinine, Ser: 0.93 mg/dL (ref 0.40–1.20)
GFR: 57.57 mL/min — ABNORMAL LOW (ref >60.00)
Glucose, Bld: 117 mg/dL — ABNORMAL HIGH (ref 70–99)
Potassium: 4.8 mEq/L (ref 3.5–5.1)
Sodium: 131 mEq/L — ABNORMAL LOW (ref 135–145)

## 2018-08-26 MED ORDER — VALACYCLOVIR HCL 1 G PO TABS: 1000.0000 mg | ORAL_TABLET | Freq: Two times a day (BID) | ORAL | 1 refills | Status: DC

## 2018-08-26 MED ORDER — LACOSAMIDE 50 MG PO TABS: 50.0000 mg | ORAL_TABLET | Freq: Two times a day (BID) | ORAL | 1 refills | Status: DC

## 2018-08-26 MED ORDER — WARFARIN SODIUM 5 MG PO TABS: ORAL_TABLET | ORAL | 1 refills | Status: DC

## 2018-08-26 MED ORDER — DIPHENOXYLATE-ATROPINE 2.5-0.025 MG PO TABS: 1.0000 | ORAL_TABLET | Freq: Four times a day (QID) | ORAL | 2 refills | Status: DC | PRN

## 2018-08-26 MED ORDER — ZOLPIDEM TARTRATE 10 MG PO TABS: ORAL_TABLET | ORAL | 1 refills | Status: DC

## 2018-08-26 NOTE — Assessment & Plan Note (Signed)
Losartan 

## 2018-08-26 NOTE — Assessment & Plan Note (Signed)
Lomotil prn 

## 2018-08-26 NOTE — Progress Notes (Signed)
Subjective:  Patient ID: Tracy Peters, female    DOB: 06/18/1933  Age: 85 y.o. MRN: [id number]  CC: No chief complaint on file.   HPI Ezabella Teska presents for breast bx, hypercoagulable state, seizures, insomnia f/u  Outpatient Medications Prior to Visit  Medication Sig Dispense Refill  . colesevelam (WELCHOL) 625 MG tablet Take 2 tablets (1,250 mg total) by mouth 2 (two) times daily with a meal. 120 tablet 11  . diphenoxylate-atropine (LOMOTIL) 2.5-0.025 MG tablet Take 1-2 tablets by mouth 4 (four) times daily as needed for diarrhea or loose stools. 120 tablet 2  . enoxaparin (LOVENOX) 100 MG/ML injection Inject 0.9 mLs (90 mg total) into the skin daily. 10 Syringe 1  . EPINEPHrine 0.3 mg/0.3 mL IJ SOAJ injection As dirrected 1 Device 3  . ferrous sulfate 325 (65 FE) MG tablet Take 1 tablet (325 mg total) by mouth 3 (three) times daily after meals. (Patient taking differently: Take 325 mg by mouth every other day. )  3  . fluticasone (FLONASE) 50 MCG/ACT nasal spray Place 2 sprays into both nostrils daily. (Patient taking differently: Place 2 sprays into both nostrils daily as needed for allergies or rhinitis. ) 48 g 3  . lacosamide (VIMPAT) 50 MG TABS tablet Take 1 tablet (50 mg total) by mouth 2 (two) times daily. 60 tablet 5  . losartan (COZAAR) 100 MG tablet Take 0.5 tablets (50 mg total) by mouth daily. 90 tablet 3  . Probiotic Product (ALIGN PO) Take by mouth. One cap by mouth Bid.    . valACYclovir (VALTREX) 1000 MG tablet TAKE 1 TABLET BY MOUTH TWICE DAILY. (Patient taking differently: 1,000 mg 2 (two) times daily. ) 180 tablet 1  . warfarin (COUMADIN) 5 MG tablet Take 1 and 1/2 tablets on Sundays, Tuesdays, Thursdays and Saturdays. All other days, take only one (1) tablet. 108 tablet 1  . zolpidem (AMBIEN) 10 MG tablet 1 & 1/2 TABLETS (15MG ) BY MOUTH AT BEDTIME AS NEEDED FOR SLEEP, OK TOREPEAT IN 4HRS 135 tablet 1  . HYDROcodone-acetaminophen (NORCO) 5-325 MG  tablet Take 1 tablet by mouth every 6 (six) hours as needed for moderate pain or severe pain. (Patient not taking: Reported on 08/25/2018) 15 tablet 0   No facility-administered medications prior to visit.     ROS: Review of Systems  Constitutional: Positive for fatigue. Negative for activity change, appetite change, chills and unexpected weight change.  HENT: Positive for hearing loss. Negative for congestion, mouth sores and sinus pressure.   Eyes: Negative for visual disturbance.  Respiratory: Negative for cough and chest tightness.   Gastrointestinal: Negative for abdominal pain and nausea.  Genitourinary: Negative for difficulty urinating, frequency and vaginal pain.  Musculoskeletal: Negative for back pain and gait problem.  Skin: Negative for pallor and rash.  Neurological: Positive for weakness. Negative for dizziness, tremors, numbness and headaches.  Hematological: Does not bruise/bleed easily.  Psychiatric/Behavioral: Negative for confusion, sleep disturbance and suicidal ideas.    Objective:  BP (!) 156/88 (BP Location: Right Arm, Patient Position: Sitting, Cuff Size: Normal)   Pulse (!) 104   Temp (!) 97.4 F (36.3 C) (Oral)   Ht 5\' 5"  (1.651 m)   Wt 133 lb (60.3 kg)   SpO2 98%   BMI 22.13 kg/m   BP Readings from Last 3 Encounters:  08/26/18 (!) 156/88  08/25/18 (!) 200/104  08/09/18 (!) 160/79    Wt Readings from Last 3 Encounters:  08/26/18 133 lb (60.3 kg)  08/25/18 133 lb 9.6 oz (60.6 kg)  08/09/18 138 lb 14.4 oz (63 kg)    Physical Exam Constitutional:      General: She is not in acute distress.    Appearance: She is well-developed.  HENT:     Head: Normocephalic.     Right Ear: External ear normal.     Left Ear: External ear normal.     Nose: Nose normal.  Eyes:     General:        Right eye: No discharge.        Left eye: No discharge.     Conjunctiva/sclera: Conjunctivae normal.     Pupils: Pupils are equal, round, and reactive to light.   Neck:     Musculoskeletal: Normal range of motion and neck supple.     Thyroid: No thyromegaly.     Vascular: No JVD.     Trachea: No tracheal deviation.  Cardiovascular:     Rate and Rhythm: Normal rate and regular rhythm.     Heart sounds: Murmur present.  Pulmonary:     Effort: No respiratory distress.     Breath sounds: No stridor. No wheezing.  Abdominal:     General: Bowel sounds are normal. There is no distension.     Palpations: Abdomen is soft. There is no mass.     Tenderness: There is no abdominal tenderness. There is no guarding or rebound.  Musculoskeletal:        General: No tenderness.  Lymphadenopathy:     Cervical: No cervical adenopathy.  Skin:    Findings: No erythema or rash.  Neurological:     Cranial Nerves: No cranial nerve deficit.     Motor: No abnormal muscle tone.     Coordination: Coordination abnormal.     Deep Tendon Reflexes: Reflexes normal.  Psychiatric:        Behavior: Behavior normal.        Thought Content: Thought content normal.        Judgment: Judgment normal.   ataxic No wax impaction   Lab Results  Component Value Date   WBC 7.1 08/05/2018   HGB 12.4 08/05/2018   HCT 37.5 08/05/2018   PLT 290 08/05/2018   GLUCOSE 97 08/05/2018   CHOL 196 01/03/2016   TRIG 85.0 01/03/2016   HDL 63.10 01/03/2016   LDLDIRECT 129.8 05/31/2012   LDLCALC 116 (H) 01/03/2016   ALT 21 08/05/2018   AST 26 08/05/2018   NA 130 (L) 08/05/2018   K 4.8 08/05/2018   CL 95 (L) 08/05/2018   CREATININE 0.90 08/05/2018   BUN 13 08/05/2018   CO2 23 08/05/2018   TSH 2.52 04/20/2018   INR 2.3 08/16/2018    No results found.  Assessment & Plan:   There are no diagnoses linked to this encounter.   No orders of the defined types were placed in this encounter.    Follow-up: No follow-ups on file.  [name], MD

## 2018-08-26 NOTE — Assessment & Plan Note (Signed)
ENT ref 

## 2018-08-26 NOTE — Assessment & Plan Note (Signed)
On Coumadin Dr Beryle Beams retired 4/20

## 2018-08-26 NOTE — Assessment & Plan Note (Signed)
Cont w/ Coumadin Dr Beryle Beams retired 4/20

## 2018-08-26 NOTE — Assessment & Plan Note (Signed)
S/p bx and lumpectomy 2/20

## 2018-08-26 NOTE — Assessment & Plan Note (Signed)
Vimpat

## 2018-08-26 NOTE — Telephone Encounter (Signed)
Scheduled appt per 3/11 sch message - unable to reach pt - left message with appt date and time and sent reminder letter in the mail.

## 2018-08-31 ENCOUNTER — Telehealth: Payer: Self-pay | Admitting: Licensed Clinical Social Worker

## 2018-08-31 DIAGNOSIS — Z510 Encounter for antineoplastic radiation therapy: Secondary | ICD-10-CM | POA: Diagnosis not present

## 2018-08-31 DIAGNOSIS — D0512 Intraductal carcinoma in situ of left breast: Secondary | ICD-10-CM | POA: Diagnosis not present

## 2018-08-31 NOTE — Telephone Encounter (Addendum)
Rescheduled Tracy Peters's genetics appointment for 11/01/2018 at 2 pm.

## 2018-09-01 ENCOUNTER — Ambulatory Visit: Payer: 59 | Admitting: Radiation Oncology

## 2018-09-01 ENCOUNTER — Encounter: Payer: Self-pay | Admitting: Pharmacist

## 2018-09-01 ENCOUNTER — Other Ambulatory Visit: Payer: Self-pay | Admitting: Pharmacist

## 2018-09-01 DIAGNOSIS — I742 Embolism and thrombosis of arteries of the upper extremities: Secondary | ICD-10-CM

## 2018-09-01 DIAGNOSIS — Z7901 Long term (current) use of anticoagulants: Secondary | ICD-10-CM

## 2018-09-01 DIAGNOSIS — Z86718 Personal history of other venous thrombosis and embolism: Secondary | ICD-10-CM

## 2018-09-01 DIAGNOSIS — D6859 Other primary thrombophilia: Secondary | ICD-10-CM

## 2018-09-02 ENCOUNTER — Inpatient Hospital Stay: Payer: 59

## 2018-09-02 ENCOUNTER — Ambulatory Visit: Payer: 59

## 2018-09-03 ENCOUNTER — Ambulatory Visit: Payer: 59

## 2018-09-03 ENCOUNTER — Other Ambulatory Visit: Payer: Self-pay | Admitting: *Deleted

## 2018-09-03 DIAGNOSIS — Z86718 Personal history of other venous thrombosis and embolism: Secondary | ICD-10-CM

## 2018-09-03 DIAGNOSIS — I742 Embolism and thrombosis of arteries of the upper extremities: Secondary | ICD-10-CM

## 2018-09-03 DIAGNOSIS — Z7901 Long term (current) use of anticoagulants: Secondary | ICD-10-CM

## 2018-09-03 DIAGNOSIS — D6859 Other primary thrombophilia: Secondary | ICD-10-CM

## 2018-09-06 ENCOUNTER — Ambulatory Visit: Payer: 59

## 2018-09-06 ENCOUNTER — Other Ambulatory Visit: Payer: Self-pay

## 2018-09-06 ENCOUNTER — Ambulatory Visit
Admission: RE | Admit: 2018-09-06 | Discharge: 2018-09-06 | Disposition: A | Discharge status: Home / Self Care | Payer: 59 | Source: Ambulatory Visit | Attending: Radiation Oncology | Admitting: Radiation Oncology

## 2018-09-06 DIAGNOSIS — Z510 Encounter for antineoplastic radiation therapy: Secondary | ICD-10-CM | POA: Diagnosis not present

## 2018-09-06 DIAGNOSIS — D0512 Intraductal carcinoma in situ of left breast: Secondary | ICD-10-CM | POA: Diagnosis not present

## 2018-09-06 MED ORDER — RADIAPLEXRX EX GEL
Freq: Once | CUTANEOUS | Status: AC
  Administered 2018-09-06: 15:00:00 via TOPICAL

## 2018-09-06 MED ORDER — ALRA NON-METALLIC DEODORANT (RAD-ONC)
1.0000 "application " | Freq: Once | TOPICAL | Status: AC
  Administered 2018-09-06: 1 via TOPICAL

## 2018-09-06 NOTE — Progress Notes (Signed)
Pt here for patient teaching.  Pt given Radiation and You booklet, skin care instructions, Alra deodorant and Radiaplex gel.  Reviewed areas of pertinence such as fatigue, hair loss, skin changes, breast tenderness and breast swelling . Pt able to give teach back of to pat skin and use unscented/gentle soap,apply Radiaplex bid, avoid applying anything to skin within 4 hours of treatment, avoid wearing an under wire bra and to use an electric razor if they must shave. Pt demonstrated understanding, needs reinforcement, no evidence of learning, refused teaching and  of information given and will contact nursing with any questions or concerns.     Http://rtanswers.org/treatmentinformation/whattoexpect/index      

## 2018-09-07 ENCOUNTER — Ambulatory Visit
Admission: RE | Admit: 2018-09-07 | Discharge: 2018-09-07 | Disposition: A | Discharge status: Home / Self Care | Payer: 59 | Source: Ambulatory Visit | Attending: Radiation Oncology | Admitting: Radiation Oncology

## 2018-09-07 ENCOUNTER — Ambulatory Visit: Payer: 59

## 2018-09-07 ENCOUNTER — Ambulatory Visit (INDEPENDENT_AMBULATORY_CARE_PROVIDER_SITE_OTHER): Payer: 59 | Admitting: General Practice

## 2018-09-07 ENCOUNTER — Other Ambulatory Visit: Payer: Self-pay

## 2018-09-07 DIAGNOSIS — D6859 Other primary thrombophilia: Secondary | ICD-10-CM | POA: Diagnosis not present

## 2018-09-07 DIAGNOSIS — Z7901 Long term (current) use of anticoagulants: Secondary | ICD-10-CM

## 2018-09-07 DIAGNOSIS — D0512 Intraductal carcinoma in situ of left breast: Secondary | ICD-10-CM | POA: Diagnosis not present

## 2018-09-07 DIAGNOSIS — I742 Embolism and thrombosis of arteries of the upper extremities: Secondary | ICD-10-CM

## 2018-09-07 DIAGNOSIS — Z86718 Personal history of other venous thrombosis and embolism: Secondary | ICD-10-CM

## 2018-09-07 DIAGNOSIS — Z510 Encounter for antineoplastic radiation therapy: Secondary | ICD-10-CM | POA: Diagnosis not present

## 2018-09-07 LAB — POCT INR: INR: 2.4 (ref 2.0–3.0)

## 2018-09-07 NOTE — Patient Instructions (Addendum)
Pre visit review using our clinic review tool, if applicable. No additional management support is needed unless otherwise documented below in the visit note.  Take one-and one-half (1 &1/2) tablets of your 5mg  peach-colored warfarin tablets by mouth every day of the week EXCEPT take one (1) tablet on [facility].

## 2018-09-08 ENCOUNTER — Ambulatory Visit
Admission: RE | Admit: 2018-09-08 | Discharge: 2018-09-08 | Disposition: A | Discharge status: Home / Self Care | Payer: 59 | Source: Ambulatory Visit | Attending: Radiation Oncology | Admitting: Radiation Oncology

## 2018-09-08 ENCOUNTER — Other Ambulatory Visit: Payer: Self-pay

## 2018-09-08 ENCOUNTER — Ambulatory Visit: Payer: 59

## 2018-09-08 DIAGNOSIS — D0512 Intraductal carcinoma in situ of left breast: Secondary | ICD-10-CM | POA: Diagnosis not present

## 2018-09-08 DIAGNOSIS — Z510 Encounter for antineoplastic radiation therapy: Secondary | ICD-10-CM | POA: Diagnosis not present

## 2018-09-08 NOTE — Progress Notes (Signed)
Medical screening examination/treatment/procedure(s) were performed by non-physician practitioner and as supervising physician I was immediately available for consultation/collaboration. I agree with above. Aleksei Plotnikov, MD  

## 2018-09-09 ENCOUNTER — Ambulatory Visit
Admission: RE | Admit: 2018-09-09 | Discharge: 2018-09-09 | Disposition: A | Discharge status: Home / Self Care | Payer: 59 | Source: Ambulatory Visit | Attending: Radiation Oncology | Admitting: Radiation Oncology

## 2018-09-09 ENCOUNTER — Other Ambulatory Visit: Payer: Self-pay

## 2018-09-09 DIAGNOSIS — Z510 Encounter for antineoplastic radiation therapy: Secondary | ICD-10-CM | POA: Diagnosis not present

## 2018-09-09 DIAGNOSIS — D0512 Intraductal carcinoma in situ of left breast: Secondary | ICD-10-CM | POA: Diagnosis not present

## 2018-09-10 ENCOUNTER — Other Ambulatory Visit: Payer: Self-pay

## 2018-09-10 ENCOUNTER — Ambulatory Visit
Admission: RE | Admit: 2018-09-10 | Discharge: 2018-09-10 | Disposition: A | Discharge status: Home / Self Care | Payer: 59 | Source: Ambulatory Visit | Attending: Radiation Oncology | Admitting: Radiation Oncology

## 2018-09-10 DIAGNOSIS — Z510 Encounter for antineoplastic radiation therapy: Secondary | ICD-10-CM | POA: Diagnosis not present

## 2018-09-10 DIAGNOSIS — D0512 Intraductal carcinoma in situ of left breast: Secondary | ICD-10-CM | POA: Diagnosis not present

## 2018-09-13 ENCOUNTER — Ambulatory Visit
Admission: RE | Admit: 2018-09-13 | Discharge: 2018-09-13 | Disposition: A | Discharge status: Home / Self Care | Payer: 59 | Source: Ambulatory Visit | Attending: Radiation Oncology | Admitting: Radiation Oncology

## 2018-09-13 ENCOUNTER — Other Ambulatory Visit: Payer: Self-pay

## 2018-09-13 ENCOUNTER — Ambulatory Visit: Payer: 59

## 2018-09-13 DIAGNOSIS — Z510 Encounter for antineoplastic radiation therapy: Secondary | ICD-10-CM | POA: Diagnosis not present

## 2018-09-13 DIAGNOSIS — D0512 Intraductal carcinoma in situ of left breast: Secondary | ICD-10-CM | POA: Diagnosis not present

## 2018-09-14 ENCOUNTER — Ambulatory Visit
Admission: RE | Admit: 2018-09-14 | Discharge: 2018-09-14 | Disposition: A | Discharge status: Home / Self Care | Payer: 59 | Source: Ambulatory Visit | Attending: Radiation Oncology | Admitting: Radiation Oncology

## 2018-09-14 ENCOUNTER — Ambulatory Visit: Payer: 59

## 2018-09-14 ENCOUNTER — Other Ambulatory Visit: Payer: Self-pay

## 2018-09-14 DIAGNOSIS — D0512 Intraductal carcinoma in situ of left breast: Secondary | ICD-10-CM | POA: Diagnosis not present

## 2018-09-14 DIAGNOSIS — Z510 Encounter for antineoplastic radiation therapy: Secondary | ICD-10-CM | POA: Diagnosis not present

## 2018-09-15 ENCOUNTER — Ambulatory Visit: Payer: 59

## 2018-09-15 ENCOUNTER — Ambulatory Visit
Admission: RE | Admit: 2018-09-15 | Discharge: 2018-09-15 | Disposition: A | Discharge status: Home / Self Care | Payer: 59 | Source: Ambulatory Visit | Attending: Radiation Oncology | Admitting: Radiation Oncology

## 2018-09-15 ENCOUNTER — Other Ambulatory Visit: Payer: Self-pay

## 2018-09-15 DIAGNOSIS — D0512 Intraductal carcinoma in situ of left breast: Secondary | ICD-10-CM | POA: Diagnosis not present

## 2018-09-15 DIAGNOSIS — Z510 Encounter for antineoplastic radiation therapy: Secondary | ICD-10-CM | POA: Diagnosis present

## 2018-09-16 ENCOUNTER — Ambulatory Visit: Payer: 59

## 2018-09-16 ENCOUNTER — Other Ambulatory Visit: Payer: Self-pay

## 2018-09-16 ENCOUNTER — Ambulatory Visit
Admission: RE | Admit: 2018-09-16 | Discharge: 2018-09-16 | Disposition: A | Discharge status: Home / Self Care | Payer: 59 | Source: Ambulatory Visit | Attending: Radiation Oncology | Admitting: Radiation Oncology

## 2018-09-16 DIAGNOSIS — Z510 Encounter for antineoplastic radiation therapy: Secondary | ICD-10-CM | POA: Diagnosis not present

## 2018-09-17 ENCOUNTER — Other Ambulatory Visit: Payer: Self-pay

## 2018-09-17 ENCOUNTER — Ambulatory Visit: Payer: 59

## 2018-09-17 ENCOUNTER — Ambulatory Visit
Admission: RE | Admit: 2018-09-17 | Discharge: 2018-09-17 | Disposition: A | Discharge status: Home / Self Care | Payer: 59 | Source: Ambulatory Visit | Attending: Radiation Oncology | Admitting: Radiation Oncology

## 2018-09-17 DIAGNOSIS — Z510 Encounter for antineoplastic radiation therapy: Secondary | ICD-10-CM | POA: Diagnosis not present

## 2018-09-20 ENCOUNTER — Other Ambulatory Visit: Payer: Self-pay

## 2018-09-20 ENCOUNTER — Ambulatory Visit
Admission: RE | Admit: 2018-09-20 | Discharge: 2018-09-20 | Disposition: A | Discharge status: Home / Self Care | Payer: 59 | Source: Ambulatory Visit | Attending: Radiation Oncology | Admitting: Radiation Oncology

## 2018-09-20 ENCOUNTER — Ambulatory Visit: Payer: 59

## 2018-09-20 DIAGNOSIS — Z510 Encounter for antineoplastic radiation therapy: Secondary | ICD-10-CM | POA: Diagnosis not present

## 2018-09-21 ENCOUNTER — Ambulatory Visit: Payer: 59

## 2018-09-21 ENCOUNTER — Other Ambulatory Visit: Payer: Self-pay

## 2018-09-21 ENCOUNTER — Ambulatory Visit
Admission: RE | Admit: 2018-09-21 | Discharge: 2018-09-21 | Disposition: A | Discharge status: Home / Self Care | Payer: 59 | Source: Ambulatory Visit | Attending: Radiation Oncology | Admitting: Radiation Oncology

## 2018-09-21 DIAGNOSIS — Z510 Encounter for antineoplastic radiation therapy: Secondary | ICD-10-CM | POA: Diagnosis not present

## 2018-09-22 ENCOUNTER — Telehealth: Payer: 59 | Admitting: Family

## 2018-09-22 ENCOUNTER — Ambulatory Visit: Payer: 59

## 2018-09-22 ENCOUNTER — Ambulatory Visit
Admission: RE | Admit: 2018-09-22 | Discharge: 2018-09-22 | Disposition: A | Discharge status: Home / Self Care | Payer: 59 | Source: Ambulatory Visit | Attending: Radiation Oncology | Admitting: Radiation Oncology

## 2018-09-22 ENCOUNTER — Other Ambulatory Visit: Payer: Self-pay

## 2018-09-22 DIAGNOSIS — R399 Unspecified symptoms and signs involving the genitourinary system: Secondary | ICD-10-CM | POA: Diagnosis not present

## 2018-09-22 DIAGNOSIS — Z510 Encounter for antineoplastic radiation therapy: Secondary | ICD-10-CM | POA: Diagnosis not present

## 2018-09-22 MED ORDER — NITROFURANTOIN MONOHYD MACRO 100 MG PO CAPS: 100.0000 mg | ORAL_CAPSULE | Freq: Two times a day (BID) | ORAL | 0 refills | Status: DC

## 2018-09-22 NOTE — Progress Notes (Signed)
We are sorry that you are not feeling well.  Here is how we plan to help!  Given patient's problem list and allergy list, this patient is not the best candidate for an Evisit. However, with the current COVID-19 issues, I will given a rx today. If problems return she will need to be seen face to face.   Approximately 5 minutes was spent documenting and reviewing patient's chart.    Based on what you shared with me it looks like you most likely have a simple urinary tract infection.  A UTI (Urinary Tract Infection) is a bacterial infection of the bladder.  Most cases of urinary tract infections are simple to treat but a key part of your care is to encourage you to drink plenty of fluids and watch your symptoms carefully.  I have prescribed MacroBid 100 mg twice a day for 5 days.  Your symptoms should gradually improve. Call us if the burning in your urine worsens, you develop worsening fever, back pain or pelvic pain or if your symptoms do not resolve after completing the antibiotic.  Urinary tract infections can be prevented by drinking plenty of water to keep your body hydrated.  Also be sure when you wipe, wipe from front to back and don't hold it in!  If possible, empty your bladder every 4 hours.  Your e-visit answers were reviewed by a board certified advanced clinical practitioner to complete your personal care plan.  Depending on the condition, your plan could have included both over the counter or prescription medications.  If there is a problem please reply  once you have received a response from your provider.  Your safety is important to [name].  If you have drug allergies check your prescription carefully.    You can use MyChart to ask questions about today's visit, request a non-urgent call back, or ask for a work or school excuse for 24 hours related to this e-Visit. If it has been greater than 24 hours you will need to follow up with your provider, or enter a new e-Visit to address  those concerns.   You will get an e-mail in the next two days asking about your experience.  I hope that your e-visit has been valuable and will speed your recovery. Thank you for using e-visits.

## 2018-09-23 ENCOUNTER — Other Ambulatory Visit: Payer: Self-pay

## 2018-09-23 ENCOUNTER — Ambulatory Visit: Payer: 59

## 2018-09-23 ENCOUNTER — Ambulatory Visit
Admission: RE | Admit: 2018-09-23 | Discharge: 2018-09-23 | Disposition: A | Discharge status: Home / Self Care | Payer: 59 | Source: Ambulatory Visit | Attending: Radiation Oncology | Admitting: Radiation Oncology

## 2018-09-23 DIAGNOSIS — Z510 Encounter for antineoplastic radiation therapy: Secondary | ICD-10-CM | POA: Diagnosis not present

## 2018-09-24 ENCOUNTER — Ambulatory Visit: Payer: 59 | Admitting: Radiation Oncology

## 2018-09-24 ENCOUNTER — Other Ambulatory Visit: Payer: Self-pay

## 2018-09-24 ENCOUNTER — Ambulatory Visit: Payer: 59

## 2018-09-24 ENCOUNTER — Ambulatory Visit
Admission: RE | Admit: 2018-09-24 | Discharge: 2018-09-24 | Disposition: A | Discharge status: Home / Self Care | Payer: 59 | Source: Ambulatory Visit | Attending: Radiation Oncology | Admitting: Radiation Oncology

## 2018-09-24 DIAGNOSIS — Z510 Encounter for antineoplastic radiation therapy: Secondary | ICD-10-CM | POA: Diagnosis not present

## 2018-09-27 ENCOUNTER — Ambulatory Visit: Payer: 59

## 2018-09-27 ENCOUNTER — Ambulatory Visit
Admission: RE | Admit: 2018-09-27 | Discharge: 2018-09-27 | Disposition: A | Discharge status: Home / Self Care | Payer: 59 | Source: Ambulatory Visit | Attending: Radiation Oncology | Admitting: Radiation Oncology

## 2018-09-27 ENCOUNTER — Other Ambulatory Visit: Payer: Self-pay | Admitting: Family

## 2018-09-27 ENCOUNTER — Other Ambulatory Visit: Payer: Self-pay

## 2018-09-27 DIAGNOSIS — R399 Unspecified symptoms and signs involving the genitourinary system: Secondary | ICD-10-CM

## 2018-09-27 DIAGNOSIS — Z510 Encounter for antineoplastic radiation therapy: Secondary | ICD-10-CM | POA: Diagnosis not present

## 2018-09-28 ENCOUNTER — Other Ambulatory Visit: Payer: Self-pay

## 2018-09-28 ENCOUNTER — Ambulatory Visit: Payer: 59

## 2018-09-28 ENCOUNTER — Ambulatory Visit
Admission: RE | Admit: 2018-09-28 | Discharge: 2018-09-28 | Disposition: A | Discharge status: Home / Self Care | Payer: 59 | Source: Ambulatory Visit | Attending: Radiation Oncology | Admitting: Radiation Oncology

## 2018-09-28 ENCOUNTER — Ambulatory Visit (INDEPENDENT_AMBULATORY_CARE_PROVIDER_SITE_OTHER): Payer: 59 | Admitting: Family

## 2018-09-28 ENCOUNTER — Encounter: Payer: Self-pay | Admitting: Family

## 2018-09-28 DIAGNOSIS — N390 Urinary tract infection, site not specified: Secondary | ICD-10-CM | POA: Diagnosis not present

## 2018-09-28 DIAGNOSIS — Z510 Encounter for antineoplastic radiation therapy: Secondary | ICD-10-CM | POA: Diagnosis not present

## 2018-09-28 DIAGNOSIS — R399 Unspecified symptoms and signs involving the genitourinary system: Secondary | ICD-10-CM

## 2018-09-28 MED ORDER — NITROFURANTOIN MONOHYD MACRO 100 MG PO CAPS: 100.0000 mg | ORAL_CAPSULE | Freq: Two times a day (BID) | ORAL | 0 refills | Status: DC

## 2018-09-28 NOTE — Progress Notes (Signed)
Tracy Peters is a 85 y.o. female with the following history as recorded in EpicCare:  Patient Active Problem List   Diagnosis Date Noted  . Hearing loss 08/26/2018  . Ductal carcinoma in situ (DCIS) of left breast 08/03/2018  . LFT elevation 12/22/2017  . Weakness 12/16/2017  . Hyponatremia 12/16/2017  . Seizure ([place]) 10/20/2017  . Dislocation of internal right hip prosthesis ([place]) 08/23/2017  . Anemia, iron deficiency 07/29/2017  . S/P right THA, PA 06/15/2017  . Right bursectomy 06/01/2017  . Preop exam for internal medicine 05/20/2017  . Cerumen impaction 01/27/2017  . Actinic keratosis 01/27/2017  . Primary hypercoagulable state ([place]) [D68.59] 11/17/2016  . Long term (current) use of anticoagulants [Z79.01] 11/17/2016  . Osteopenia 07/14/2016  . Right-sided chest wall pain 04/17/2016  . 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 Dysuria 06/15/2015  . Cervical spondylosis with myelopathy and radiculopathy 01/18/2014  . Cervical radiculitis 12/28/2013  . Left knee pain 12/28/2013  . Paronychia 12/28/2013  . Chronic anticoagulation 03/21/2013  . Well adult exam 06/02/2012  . 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 Urticaria 05/28/2011  . DIVERTICULOSIS, COLON 04/30/2010  . COLONIC POLYPS, ADENOMATOUS, HX OF 04/30/2010  . Dyslipidemia 09/20/2009  . VERTIGO 09/20/2009  . ALLERGIC RHINITIS 03/27/2009  . INSOMNIA, PERSISTENT 09/20/2008  . PARESTHESIA 09/20/2008  . RASH AND OTHER NONSPECIFIC SKIN ERUPTION 03/22/2008  . ACUTE BRONCHITIS 02/22/2008  . Nonspecific (abnormal) findings on radiological and other examination of body structure 02/22/2008  . CHEST XRAY, ABNORMAL 02/22/2008  . GERD 09/21/2007  . Essential hypertension 03/23/2007  . LOW BACK PAIN 03/23/2007  . DVT, HX OF 03/23/2007  . Diarrhea 03/18/2007    Current Outpatient Medications  Medication Sig Dispense Refill  . colesevelam (WELCHOL) 625 MG tablet Take 2 tablets (1,250 mg  total) by mouth 2 (two) times daily with a meal. 120 tablet 11  . diphenoxylate-atropine (LOMOTIL) 2.5-0.025 MG tablet Take 1-2 tablets by mouth 4 (four) times daily as needed for diarrhea or loose stools. 120 tablet 2  . enoxaparin (LOVENOX) 100 MG/ML injection Inject 0.9 mLs (90 mg total) into the skin daily. 10 Syringe 1  . EPINEPHrine 0.3 mg/0.3 mL IJ SOAJ injection As dirrected 1 Device 3  . ferrous sulfate 325 (65 FE) MG tablet Take 1 tablet (325 mg total) by mouth 3 (three) times daily after meals. (Patient taking differently: Take 325 mg by mouth every other day. )  3  . fluticasone (FLONASE) 50 MCG/ACT nasal spray Place 2 sprays into both nostrils daily. (Patient taking differently: Place 2 sprays into both nostrils daily as needed for allergies or rhinitis. ) 48 g 3  . lacosamide (VIMPAT) 50 MG TABS tablet Take 1 tablet (50 mg total) by mouth 2 (two) times daily. 180 tablet 1  . losartan (COZAAR) 100 MG tablet Take 0.5 tablets (50 mg total) by mouth daily. 90 tablet 3  . nitrofurantoin, macrocrystal-monohydrate, (MACROBID) 100 MG capsule Take 1 capsule (100 mg total) by mouth 2 (two) times daily. 1 po BId 10 capsule 0  . Probiotic Product (ALIGN PO) Take by mouth. One cap by mouth Bid.    . valACYclovir (VALTREX) 1000 MG tablet Take 1 tablet (1,000 mg total) by mouth 2 (two) times daily. 180 tablet 1  . warfarin (COUMADIN) 5 MG tablet Take 1 and 1/2 tablets on Sundays, Tuesdays, Thursdays and Saturdays. All other days, take only one (1) tablet. 108 tablet 1  . zolpidem (AMBIEN) 10  MG tablet 1 & 1/2 TABLETS (15MG) BY MOUTH AT BEDTIME AS NEEDED FOR SLEEP, OK TOREPEAT IN 4HRS 135 tablet 1   No current facility-administered medications for this visit.     Allergies: Bee venom; Ivp dye [iodinated diagnostic agents]; Shellfish allergy; Temazepam; Amlodipine besylate; Aspirin; Atorvastatin; Cholestyramine; Codeine phosphate; Demerol [meperidine]; Diphenhydramine hcl; Doxycycline; Gentamicin  sulfate; Keppra [levetiracetam]; Meclizine hcl; Meperidine hcl; Metronidazole; Moxifloxacin; Sulfamethoxazole; and Penicillins  Past Medical History:  Diagnosis Date  . Adenomatous colon polyp   . Allergic rhinitis    uses FLonase nightly  . Anemia   . Arthritis    joint pain  . Blood transfusion   . Bronchitis    hx of-7-90yr ago  . Carpal tunnel syndrome    numbness and tingling   . Cataracts, bilateral   . Chronic anticoagulation 03/21/2013  . Congenital absence of one kidney    pt only has one kidney  . Diarrhea    takes Immodium daily as needed or Lomotil   . Diverticulosis   . GERD (gastroesophageal reflux disease)    mild  . Herpes    takes Valtrex daily  . History of colon polyps   . History of DVT (deep vein thrombosis) 1998   right arm  . History of staph infection   . Hyperlipidemia    Medical MD doesn't think its absolutely necessary  . Hypertension    takes Losartan daily  . IBS (irritable bowel syndrome)    takes AElectronics engineerdaily  . Insomnia    takes Ambien nightly as needed  . LBP (low back pain)   . Long term (current) use of anticoagulants    Dr. GBeryle Beams . Nocturia   . Osteoporosis   . Raynaud's disease   . Sarcoma of left lower extremity ([place]    Dr. AWinfield Cunas . Seizures ([place] 10/20/2017   has not had a seizure since may 2019  . 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[place] 09/16/2011   Right digital artery 1998 idiopathic   . Urinary leakage   . UTI (urinary tract infection)     Past Surgical History:  Procedure Laterality Date  .  kidney disease left     . ABDOMINAL HYSTERECTOMY    . ANTERIOR CERVICAL DECOMP/DISCECTOMY FUSION N/A 01/18/2014   Procedure: ANTERIOR CERVICAL DECOMPRESSION/DISCECTOMY FUSION 3 LEVELS  Cervical  four/five, five/six, six/seven anterior cervical decompression with fusion interbody prosthesis with plating and bonegraft;  Surgeon: JNewman Pies MD;  [facility];  Service: Neurosurgery;  Laterality: N/A;  t  .  APPENDECTOMY    . BACK SURGERY     2  . BREAST BIOPSY Left 30+ yrs ago   benign  . BREAST LUMPECTOMY WITH RADIOACTIVE SEED LOCALIZATION Left 08/06/2018   Procedure: LEFT BREAST LUMPECTOMY WITH RADIOACTIVE SEED LOCALIZATION;  Surgeon: IFanny Skates MD;  [facility]  Service: General;  Laterality: Left;  . BREAST SURGERY     cystectomy-benign  . CARPAL TUNNEL RELEASE Right 06/12/2014   Procedure: CARPAL TUNNEL RELEASE;  Surgeon: JNewman Pies MD;  [facility];  Service: Neurosurgery;  Laterality: Right;  Right Carpal Tunnel Release  . CATARACT EXTRACTION Bilateral 02/15/2015  . CHOLECYSTECTOMY    . COLONOSCOPY    . [place] . HIP CLOSED REDUCTION Right 08/23/2017   Procedure: CLOSED MANIPULATION HIP;  Surgeon: RNicholes Stairs MD;  [facility];  Service: Orthopedics;  Laterality: Right;  . I&D of abdomen  1988  couple of wks after gallbladder removed  . KNEE SURGERY     left x 3  . LUMBAR LAMINECTOMY     X 2  . mortons neuromas removed    . OPEN SURGICAL REPAIR OF GLUTEAL TENDON Right 06/15/2017   Procedure: OPEN SURGICAL REPAIR OF GLUTEALmedius TENDON;  Surgeon: Paralee Cancel, MD;  Location[facility];  Service: Orthopedics;  Laterality: Right;  . SHOULDER SURGERY     Right  . TOTAL HIP ARTHROPLASTY Right 06/15/2017   Procedure: Right total hip arthroplasty, bursectomy, repair of gluteal tendon, posterior approach;  Surgeon: Paralee Cancel, MD;  Location: [facility];  Service: Orthopedics;  Laterality: Right;  90 mins for all procedures together    Family History  Problem Relation Age of Onset  . Breast cancer Sister   . Kidney disease Sister 26       nephritis  . Parkinsonism Mother   . Heart disease Brother   . Osteoarthritis Other   . Ulcerative colitis Sister   . Kidney disease Sister   . Breast cancer Sister   . Colon cancer Neg Hx     Social History   Tobacco Use  . Smoking status: Never Smoker  .  Smokeless tobacco: Never Used  Substance Use Topics  . Alcohol use: No    Alcohol/week: 0.0 standard drinks    Subjective:    I connected with Jerene Canny on 09/28/18 at 11:20 AM EDT by a video enabled telemedicine application and verified that I am speaking with the correct person using two identifiers.   I discussed the limitations of evaluation and management by telemedicine and the availability of in person appointments. The patient expressed understanding and agreed to proceed. The patient, her caregiver and myself were present on the phone call.  Patient was treated for UTI last week ( 09/22/2018) by e-visit for suspected UTI; she has been experiencing urinary pressure, urgency and frequency; she was given 5 days of Macrobid- last pill was yesterday; per patient, she is better but is concerned that symptoms have not cleared completely; states she often has to be treated for 14 days to get full clearance; she is prone to recurrent UTIs; has extensive allergies to antibiotics and very limited options available; she denies any fever or blood in her urine; is having daily radiation treatments for DCIS;    Objective:  There were no vitals filed for this visit.  General: Well developed, well nourished, in no acute distress  Skin : Warm and dry.  Head: Normocephalic and atraumatic  Lungs: Respirations unlabored;  Neurologic: Alert and oriented; speech intact; face symmetrical;   Assessment:  1. Urinary tract infection without hematuria, site unspecified     Plan:  Will extend treatment with Macrobid 100 mg bid x 10 more days; if symptoms persist after this, patient will need to bring sample to the office for urine culture; increase fluids, rest and follow-up worse, no better.    No follow-ups on file.  No orders of the defined types were placed in this encounter.   Requested Prescriptions    No prescriptions requested or ordered in this encounter

## 2018-09-29 ENCOUNTER — Ambulatory Visit
Admission: RE | Admit: 2018-09-29 | Discharge: 2018-09-29 | Disposition: A | Discharge status: Home / Self Care | Payer: 59 | Source: Ambulatory Visit | Attending: Radiation Oncology | Admitting: Radiation Oncology

## 2018-09-29 ENCOUNTER — Other Ambulatory Visit: Payer: Self-pay

## 2018-09-29 DIAGNOSIS — Z510 Encounter for antineoplastic radiation therapy: Secondary | ICD-10-CM | POA: Diagnosis not present

## 2018-09-30 ENCOUNTER — Ambulatory Visit
Admission: RE | Admit: 2018-09-30 | Discharge: 2018-09-30 | Disposition: A | Discharge status: Home / Self Care | Payer: 59 | Source: Ambulatory Visit | Attending: Radiation Oncology | Admitting: Radiation Oncology

## 2018-09-30 ENCOUNTER — Encounter: Payer: Self-pay | Admitting: *Deleted

## 2018-09-30 ENCOUNTER — Other Ambulatory Visit: Payer: Self-pay

## 2018-09-30 DIAGNOSIS — Z510 Encounter for antineoplastic radiation therapy: Secondary | ICD-10-CM | POA: Diagnosis not present

## 2018-10-01 ENCOUNTER — Encounter: Payer: Self-pay | Admitting: Radiation Oncology

## 2018-10-01 ENCOUNTER — Ambulatory Visit
Admission: RE | Admit: 2018-10-01 | Discharge: 2018-10-01 | Disposition: A | Discharge status: Home / Self Care | Payer: 59 | Source: Ambulatory Visit | Attending: Radiation Oncology | Admitting: Radiation Oncology

## 2018-10-01 ENCOUNTER — Other Ambulatory Visit: Payer: Self-pay

## 2018-10-01 DIAGNOSIS — Z510 Encounter for antineoplastic radiation therapy: Secondary | ICD-10-CM | POA: Diagnosis not present

## 2018-10-05 ENCOUNTER — Ambulatory Visit (INDEPENDENT_AMBULATORY_CARE_PROVIDER_SITE_OTHER): Payer: 59 | Admitting: General Practice

## 2018-10-05 ENCOUNTER — Ambulatory Visit: Payer: 59

## 2018-10-05 DIAGNOSIS — Z7901 Long term (current) use of anticoagulants: Secondary | ICD-10-CM

## 2018-10-05 DIAGNOSIS — D6859 Other primary thrombophilia: Secondary | ICD-10-CM | POA: Diagnosis not present

## 2018-10-05 DIAGNOSIS — Z86718 Personal history of other venous thrombosis and embolism: Secondary | ICD-10-CM

## 2018-10-05 DIAGNOSIS — I742 Embolism and thrombosis of arteries of the upper extremities: Secondary | ICD-10-CM

## 2018-10-05 LAB — POCT INR: INR: 1.9 — AB (ref 2.0–3.0)

## 2018-10-05 NOTE — Patient Instructions (Addendum)
Pre visit review using our clinic review tool, if applicable. No additional management support is needed unless otherwise documented below in the visit note.  Take 2 tablets (10 mg) today and then continue to take one-and one-half (1 &1/2) tablets of your 5mg  peach-colored warfarin tablets by mouth every day of the week EXCEPT take one (1) tablet on [facility]. Re-check in 4 to 6 weeks.

## 2018-10-07 ENCOUNTER — Telehealth: Payer: Self-pay | Admitting: Radiation Oncology

## 2018-10-07 MED ORDER — METHYLPREDNISOLONE 4 MG PO TABS: ORAL_TABLET | ORAL | 0 refills | Status: DC

## 2018-10-07 NOTE — Telephone Encounter (Signed)
The patient called complaining of urticaria that was only mildly responsive to hydrocortisone in the breast area where she recently finished radiotherapy. The patient is allergic to benadryl and we tried to electronically connect by telemedicine but she could not get her phone to work. I discussed the use of a medrol dose pack and this was sent to her pharmacy. I also suggested she try OTC Zantac for further Hystamine blockade and topical hydrocortisone. She will notify me if her symptoms do not resolve.    Carola Rhine, PAC

## 2018-10-10 ENCOUNTER — Other Ambulatory Visit: Payer: Self-pay | Admitting: Internal Medicine

## 2018-10-12 ENCOUNTER — Other Ambulatory Visit: Payer: Self-pay | Admitting: Radiation Oncology

## 2018-10-29 ENCOUNTER — Telehealth: Payer: Self-pay | Admitting: Oncology

## 2018-10-29 NOTE — Telephone Encounter (Signed)
Called patient regarding upcoming Webex appointment, patient has switched to a new insurance and would like to figure all of that out before she proceeds with these appointments. Appointment has been cancelled, patient will call back when she's ready to reschedule.  Message to provider and Santiago Glad.

## 2018-11-01 ENCOUNTER — Telehealth: Payer: Self-pay | Admitting: Genetic Counselor

## 2018-11-01 ENCOUNTER — Inpatient Hospital Stay: Payer: 59 | Admitting: Genetic Counselor

## 2018-11-01 ENCOUNTER — Other Ambulatory Visit: Payer: Self-pay | Admitting: Oncology

## 2018-11-01 ENCOUNTER — Inpatient Hospital Stay: Payer: 59

## 2018-11-01 NOTE — Telephone Encounter (Signed)
Tracy Peters had questions about this testing, as she thinks that she may have already had it performed.  We do not have a record of her being seen in genetics prior to today.  She said that she was referred from Dr. Delanna Ahmadi office to the [facility] and then Dr. Jana Hakim referred her to [name].  I explained that Dr. Jana Hakim would not have ordered genetic testing if he was referring her to [name], but that maybe genetic testing was performed on her tumor (oncotype or mammoprint).  However, Dr. Matthew Saras does do this in his office and maybe it was done there.  She will call Dr. Delanna Ahmadi office and I will reach out to Dr. Doris Cheadle.  She will call me back and let me know what Dr. Matthew Saras has to say.

## 2018-11-03 NOTE — Progress Notes (Signed)
Medical screening examination/treatment/procedure(s) were performed by non-physician practitioner and as supervising physician I was immediately available for consultation/collaboration. I agree with above. Aleksei Plotnikov, MD  

## 2018-11-04 NOTE — Progress Notes (Signed)
  Radiation Oncology         [phone]) [id number] ________________________________  Name: Tracy Peters MRN: [id number]  Date: 10/01/2018  DOB: 1934/05/16  End of Treatment Note  Diagnosis:   85 y.o. female with [id number] Low grade ER positive DCIS of the left breast  Indication for treatment:  Curative       Radiation treatment dates:   09/06/2018 - 10/01/2018  Site/dose:   The patient initially received a dose of 42.56 Gy in 16 fractions to the left breast using whole-breast tangent fields. This was delivered using a 3-D conformal technique. The patient then received a boost to the seroma. This delivered an additional 8 Gy in 4 fractions using a 2 field photon technique due to the depth of the seroma. The total dose was 50.56 Gy.  Narrative: The patient tolerated radiation treatment relatively well.   The patient had some expected skin irritation as she progressed during treatment. Moist desquamation was not present at the end of treatment. She is applying Radiaplex gel to her skin. She also noted increased fatigue and some breast tenderness.  Plan: The patient has completed radiation treatment. The patient will return to radiation oncology clinic for routine followup in one month. I advised the patient to call or return sooner if they have any questions or concerns related to their recovery or treatment. ________________________________  Jodelle Gross, MD, PhD  This document serves as a record of services personally performed by Kyung Rudd, MD. It was created on his behalf by Rae Lips, a trained medical scribe. The creation of this record is based on the scribe's personal observations and the provider's statements to them. This document has been checked and approved by the attending provider.

## 2018-11-12 ENCOUNTER — Ambulatory Visit: Payer: Self-pay | Admitting: *Deleted

## 2018-11-12 NOTE — Telephone Encounter (Signed)
Pt called with having left side pain below the rib cage that started around 12 today. It comes and goes and it can be sharp. She thought it was gas and has taken Gas-X, because she has a history of gas and GI problems. She has had 3 bm's today.  She denies a fever, shortness of breath, or cough. She has not been around anyone that has been sick. She has a hx of hypertension and is on B/P medication. She has had radiation treatment for breast cancer in April. With her history, she is advised to go to an urgent care for treatment. Pt voiced understanding. Routing to [facility] at [place] for review.  Reason for Disposition . [1] Chest pain lasting <= 5 minutes AND [2] NO chest pain or cardiac symptoms now(Exceptions: pains lasting a few seconds)  Answer Assessment - Initial Assessment Questions 1. LOCATION: "Where does it hurt?"       Left side under rib cage 2. RADIATION: "Does the pain go anywhere else?" (e.g., into neck, jaw, arms, back)     no 3. ONSET: "When did the chest pain begin?" (Minutes, hours or days)      This morning 4. PATTERN "Does the pain come and go, or has it been constant since it started?"  "Does it get worse with exertion?"      Comes and goes 5. DURATION: "How long does it last" (e.g., seconds, minutes, hours)     15 or 20 seconds 6. SEVERITY: "How bad is the pain?"  (e.g., Scale 1-10; mild, moderate, or severe)    - MILD (1-3): doesn't interfere with normal activities     - MODERATE (4-7): interferes with normal activities or awakens from sleep    - SEVERE (8-10): excruciating pain, unable to do any normal activities       Pain # 9 when it is happening 7. CARDIAC RISK FACTORS: "Do you have any history of heart problems or risk factors for heart disease?" (e.g., prior heart attack, angina; high blood pressure, diabetes, being overweight, high cholesterol, smoking, or strong family history of heart disease)     Hypertension,  8. PULMONARY RISK FACTORS: "Do you have any  history of lung disease?"  (e.g., blood clots in lung, asthma, emphysema, birth control pills)     no 9. CAUSE: "What do you think is causing the chest pain?"     Does not know 10. OTHER SYMPTOMS: "Do you have any other symptoms?" (e.g., dizziness, nausea, vomiting, sweating, fever, difficulty breathing, cough)       no 11. PREGNANCY: "Is there any chance you are pregnant?" "When was your last menstrual period?"       no  Protocols used: CHEST PAIN-A-AH

## 2018-11-16 ENCOUNTER — Other Ambulatory Visit: Payer: Self-pay

## 2018-11-16 ENCOUNTER — Ambulatory Visit (INDEPENDENT_AMBULATORY_CARE_PROVIDER_SITE_OTHER): Payer: 59 | Admitting: General Practice

## 2018-11-16 DIAGNOSIS — I742 Embolism and thrombosis of arteries of the upper extremities: Secondary | ICD-10-CM

## 2018-11-16 DIAGNOSIS — Z7901 Long term (current) use of anticoagulants: Secondary | ICD-10-CM

## 2018-11-16 DIAGNOSIS — D6859 Other primary thrombophilia: Secondary | ICD-10-CM | POA: Diagnosis not present

## 2018-11-16 DIAGNOSIS — Z86718 Personal history of other venous thrombosis and embolism: Secondary | ICD-10-CM

## 2018-11-16 LAB — POCT INR: INR: 1.8 — AB (ref 2.0–3.0)

## 2018-11-16 MED ORDER — ZOLPIDEM TARTRATE 10 MG PO TABS: ORAL_TABLET | ORAL | 1 refills | Status: DC

## 2018-11-16 NOTE — Patient Instructions (Addendum)
Pre visit review using our clinic review tool, if applicable. No additional management support is needed unless otherwise documented below in the visit note.  Take 2 tablets (10 mg) today and then take one-and one-half (1 &1/2) tablets of your 5mg  peach-colored warfarin tablets by mouth every day of the week.  Re-check in July.

## 2018-11-16 NOTE — Telephone Encounter (Signed)
-----   Message from Warden Fillers, RN sent at 11/16/2018 11:28 AM EDT ----- Regarding: Re-fill Ambien Patient is asking for a Re-fill for Ambien.

## 2018-11-16 NOTE — Telephone Encounter (Signed)
noted 

## 2018-11-17 DIAGNOSIS — Z809 Family history of malignant neoplasm, unspecified: Secondary | ICD-10-CM | POA: Diagnosis not present

## 2018-11-22 ENCOUNTER — Telehealth: Payer: Self-pay | Admitting: Internal Medicine

## 2018-11-22 NOTE — Telephone Encounter (Signed)
Called pt gave her MD response. Pt states that;s when she was taking Depakote. The combination of both med made her stomach uneasy, but she has taken tramadol by itself and it does well. She is c/o pf (L) back and she states the tramadol helps.Marland KitchenJohny Chess

## 2018-11-22 NOTE — Telephone Encounter (Signed)
He was prescribed a long time ago.  Can she tolerate tramadol?  The chart saying that she had nausea and vomiting with codeine which is closely related to the tramadol.  Thanks

## 2018-11-22 NOTE — Telephone Encounter (Signed)
Copied from [place] [id number]. Topic: Quick Communication - Rx Refill/Question >> Nov 22, 2018 12:28 PM Tracy Peters, Marland Kitchen wrote: Medication: tramadol  Has the patient contacted their pharmacy? Yes.   (Agent: If no, request that the patient contact the pharmacy for the refill.) (Agent: If yes, when and what did the pharmacy advise?)  Preferred Pharmacy (with phone number or street name): [facility] Pt says this is for lower back back pain. She has had this med prior Please cal if appt is needed  Agent: Please be advised that RX refills may take up to 3 business days. We ask that you follow-up with your pharmacy.

## 2018-11-22 NOTE — Telephone Encounter (Signed)
Check [place] registry last filled Tramadol 03/02/2017..Med no longer on med list Pls advise.Marland KitchenJohny Peters

## 2018-11-23 DIAGNOSIS — H9113 Presbycusis, bilateral: Secondary | ICD-10-CM | POA: Insufficient documentation

## 2018-11-23 MED ORDER — TRAMADOL HCL 50 MG PO TABS: 50.0000 mg | ORAL_TABLET | Freq: Four times a day (QID) | ORAL | 0 refills | Status: AC | PRN

## 2018-11-23 NOTE — Telephone Encounter (Signed)
[facility] Rx Thx

## 2018-11-23 NOTE — Telephone Encounter (Signed)
Notified pt MD sent rx to [facility] 

## 2018-12-17 ENCOUNTER — Other Ambulatory Visit: Payer: Self-pay | Admitting: Pharmacist

## 2018-12-17 ENCOUNTER — Other Ambulatory Visit: Payer: Self-pay | Admitting: Internal Medicine

## 2018-12-21 ENCOUNTER — Other Ambulatory Visit: Payer: Self-pay | Admitting: General Practice

## 2018-12-21 LAB — POCT INR: INR: 2.7 (ref 2.0–3.0)

## 2018-12-21 MED ORDER — WARFARIN SODIUM 5 MG PO TABS: ORAL_TABLET | ORAL | 0 refills | Status: DC

## 2018-12-27 DIAGNOSIS — H903 Sensorineural hearing loss, bilateral: Secondary | ICD-10-CM | POA: Diagnosis not present

## 2018-12-28 ENCOUNTER — Ambulatory Visit (INDEPENDENT_AMBULATORY_CARE_PROVIDER_SITE_OTHER): Payer: Medicare Other | Admitting: General Practice

## 2018-12-28 ENCOUNTER — Other Ambulatory Visit: Payer: Self-pay

## 2018-12-28 DIAGNOSIS — Z86718 Personal history of other venous thrombosis and embolism: Secondary | ICD-10-CM

## 2018-12-28 DIAGNOSIS — D6859 Other primary thrombophilia: Secondary | ICD-10-CM

## 2018-12-28 DIAGNOSIS — I742 Embolism and thrombosis of arteries of the upper extremities: Secondary | ICD-10-CM

## 2018-12-28 DIAGNOSIS — Z7901 Long term (current) use of anticoagulants: Secondary | ICD-10-CM

## 2018-12-28 NOTE — Patient Instructions (Addendum)
Pre visit review using our clinic review tool, if applicable. No additional management support is needed unless otherwise documented below in the visit note.  Continue to take one-and one-half (1 &1/2) tablets of your 5mg  peach-colored warfarin tablets by mouth every day of the week.

## 2018-12-29 ENCOUNTER — Telehealth: Payer: Self-pay | Admitting: Oncology

## 2018-12-29 NOTE — Telephone Encounter (Signed)
Patient returned phone call regarding voicemail that was left, per request July appointments has been rescheduled to 08/06.

## 2018-12-29 NOTE — Progress Notes (Incomplete)
[place]  [phone] [phone]) [phone]    ID: Tracy Peters DOB: [birth year]  MR#: [id number]  SWH#:[id number]  Patient Care Team: Cassandria Anger, MD as PCP - General Justice Britain, MD (Orthopedic Surgery) Annia Belt, MD (Hematology and Oncology) Molli Posey, MD (Obstetrics and Gynecology) Lafayette Dragon, MD (Inactive) (Gastroenterology) Newman Pies, MD as Consulting Physician (Neurosurgery) Gaynelle Arabian, MD as Consulting Physician (Orthopedic Surgery) Paralee Cancel, MD as Consulting Physician (Orthopedic Surgery) Magrinat, Virgie Dad, MD as Consulting Physician (Oncology) Fanny Skates, MD as Consulting Physician (General Surgery) OTHER MD:   CHIEF COMPLAINT: 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      Results for Tracy, Peters (MRN [id number]) as of 08/03/2018 16:04  Ref. Range 05/10/2018 11:30 05/31/2018 14:50 07/12/2018 15:08 08/02/2018 15:30  INR Latest Ref Range: 2.0 - 3.0  1.4 (A) 2.5 2.6 2.2   She is scheduled for a left breast lumpectomy on 08/06/2018 wit Dr. Fanny Skates.    REVIEW OF SYSTEMS: Tracy Peters denies unusual headaches, visual changes, nausea, vomiting, stiff neck, dizziness, or gait imbalance. There has been no cough, phlegm production, or pleurisy, no chest pain or pressure, and no change in bowel or bladder habits. The patient denies fever, rash, bleeding, unexplained fatigue or unexplained weight loss. A detailed review of systems was otherwise entirely negative.  HISTORY OF CURRENT ILLNESS: Tracy Peters underwent routine screening mammography in early 06/2018 showing a change in her left breast. She underwent unilateral left diagnostic mammography with tomography at [facility] on 07/06/2018 showing: Breast Density Category C. There is a 0.7 cm group of amorphous calcifications within the lower inner left breast posterior depth.  Accordingly on 07/09/2018 she proceeded to biopsy of the left breast area in question. The pathology from this procedure showed ([id number]): ductal carcinoma in situ, low nuclear grade with calcifications. Prognostic indicators significant for: estrogen receptor, 100% positive with strong staining intensity and progesterone receptor, 10% positive, with moderate staining intensity.   The patient's subsequent history is as detailed below.   PAST MEDICAL HISTORY: Past Medical History:  Diagnosis Date   Adenomatous colon polyp    Allergic rhinitis    uses FLonase nightly   Anemia    Arthritis    joint pain   Blood transfusion    Bronchitis    hx of-7-70yr ago   Carpal tunnel syndrome    numbness and tingling    Cataracts, bilateral    Chronic anticoagulation 03/21/2013   Congenital absence of one kidney    pt only has one kidney   Diarrhea    takes Immodium daily as needed or Lomotil    Diverticulosis    GERD (gastroesophageal reflux disease)    mild   Herpes    takes Valtrex daily   History of colon polyps    History of DVT (deep vein thrombosis) 1998   right arm   History of staph infection    Hyperlipidemia    Medical MD doesn't think its absolutely necessary   Hypertension    takes Losartan daily   IBS (irritable bowel syndrome)    takes Align daily   Insomnia    takes Ambien nightly as needed   LBP (low back pain)    Long term (current) use of anticoagulants    Dr. 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   Right digital artery 1998 idiopathic    Urinary leakage    UTI  (urinary tract infection)      PAST SURGICAL HISTORY: Past Surgical History:  Procedure Laterality Date    kidney disease left      ABDOMINAL HYSTERECTOMY     ANTERIOR CERVICAL DECOMP/DISCECTOMY FUSION N/A 01/18/2014   Procedure: ANTERIOR CERVICAL DECOMPRESSION/DISCECTOMY FUSION 3 LEVELS  Cervical  four/five, five/six, six/seven anterior cervical decompression with fusion interbody prosthesis with plating and bonegraft;  Surgeon: Newman Pies, MD;  Location: [facility];  Service: Neurosurgery;  Laterality: N/A;  t   APPENDECTOMY     BACK SURGERY     2   BREAST BIOPSY Left 30+ yrs ago   benign   BREAST LUMPECTOMY WITH RADIOACTIVE SEED LOCALIZATION Left 08/06/2018   Procedure: LEFT BREAST LUMPECTOMY WITH RADIOACTIVE SEED LOCALIZATION;  Surgeon: Fanny Skates, MD;  Location: [facility];  Service: General;  Laterality: Left;   BREAST SURGERY     cystectomy-benign   CARPAL TUNNEL RELEASE Right 06/12/2014   Procedure: CARPAL TUNNEL RELEASE;  Surgeon: Newman Pies, MD;  Location: [facility];  Service: Neurosurgery;  Laterality: Right;  Right Carpal Tunnel Release   CATARACT EXTRACTION Bilateral 02/15/2015   CHOLECYSTECTOMY     COLONOSCOPY     ECTOPIC PREGNANCY SURGERY  1959   HIP CLOSED REDUCTION Right 08/23/2017   Procedure: CLOSED MANIPULATION HIP;  Surgeon: Nicholes Stairs, MD;  Location: [facility];  Service: Orthopedics;  Laterality: Right;   I&D of abdomen  1988   couple of wks after gallbladder removed   KNEE SURGERY     left x 3   LUMBAR LAMINECTOMY     X 2   mortons neuromas removed     OPEN SURGICAL REPAIR OF GLUTEAL TENDON Right 06/15/2017   Procedure: OPEN SURGICAL REPAIR OF GLUTEALmedius TENDON;  Surgeon: Paralee Cancel, MD;  Location: [facility];  Service: Orthopedics;  Laterality: Right;   SHOULDER SURGERY     Right   TOTAL HIP ARTHROPLASTY Right 06/15/2017   Procedure: Right total hip arthroplasty, bursectomy, repair of gluteal  tendon, posterior approach;  Surgeon: Paralee Cancel, MD;  Location: [facility];  Service: Orthopedics;  Laterality: Right;  90 mins for all procedures together     FAMILY HISTORY: Family History  Problem Relation Age of Onset   Breast cancer Sister    Kidney disease Sister 13       nephritis   Parkinsonism Mother    Heart disease Brother    Osteoarthritis Other    Ulcerative colitis Sister    Kidney disease Sister    Breast cancer Sister    Colon cancer Neg Hx    Tracy Peters father died from complications after a fall at age 36. Patients' mother died from natural causes at age 27. The patient has 3 brothers and 6 sisters. Gianina has a sister that was diagnosed with breast cancer in her mid 20's and two nephews that were diagnosed with prostate cancer. Patient denies anyone in her family having ovarian or pancreatic cancer. Tracy Peters has a brother that was diagnosed with testicular cancer at 76.    GYNECOLOGIC HISTORY:  No LMP recorded. Patient has had a hysterectomy. 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 Her husband, Tracy Peters, is the Energy manager for [employer]. Selita has two children, Suanne Marker and [place]. Suanne Marker is 7, lives in [place], and works at [employer]. Marylyn Ishihara is 32, lives in [place], and is an Chief Financial Officer. Reginald has two blood grandchildren and one step-grandchild. She is a Tourist information centre manager.    ADVANCED DIRECTIVES: Her husband, Tracy Peters, is automatically her healthcare power of attorney.     HEALTH MAINTENANCE: Social History   Tobacco Use   Smoking status: Never Smoker   Smokeless tobacco: Never Used  Substance  Use Topics   Alcohol use: No    Alcohol/week: 0.0 standard drinks   Drug use: No    Colonoscopy: Due in 2022  PAP: Hysterectomy, pelvic exams only  Bone density: yes, "bone loss" - prescribed injections, but she didn't receive them.   Allergies  Allergen Reactions   Bee Venom Anaphylaxis and Other (See Comments)    Yellow jackets, wasps as well   Ivp Dye [Iodinated Diagnostic Agents] Anaphylaxis   Shellfish Allergy Anaphylaxis   Temazepam Other (See Comments)    Severe Hallucinations and Paranoia   Amlodipine Besylate Swelling   Aspirin Other (See Comments)    REACTION: break out in sweats   Atorvastatin Diarrhea   Cholestyramine Other (See Comments)    REACTION: mouth irritation   Codeine Phosphate Nausea And Vomiting    Can take Tramadol ok   Demerol [Meperidine] Other (See Comments)    Severe Hallucination!!!!   Diphenhydramine Hcl Other (See Comments)    hyperactive   Doxycycline Nausea Only   Gentamicin Sulfate Other (See Comments)    REACTION: Shortness of breath    Keppra [Levetiracetam]     Made her too groggy/ sleepy   Meclizine Hcl Other (See Comments)    REACTION: more dizziness on it   Meperidine Hcl Other (See Comments)    Unknown   Metronidazole Other (See Comments)    REACTION: bad taste   Moxifloxacin Other (See Comments)    REACTION: insomnia Able to use Cipro   Sulfamethoxazole Other (See Comments)    Kidney problems   Penicillins Swelling, Rash and Other (See Comments)    Has patient had a PCN reaction causing immediate rash, facial/tongue/throat swelling, SOB or lightheadedness with hypotension: Yes Has patient had a PCN reaction causing severe rash involving mucus membranes or skin necrosis: No Has patient had a PCN reaction that required hospitalization: No Has patient had a PCN reaction occurring within the last 10 years: No If all of the above answers are "NO", then may proceed with Cephalosporin use.     Current  Outpatient Medications  Medication Sig Dispense Refill   colesevelam (WELCHOL) 625 MG tablet Take 2 tablets (1,250 mg total) by mouth 2 (two) times daily with a meal. 120 tablet 11   diphenoxylate-atropine (LOMOTIL) 2.5-0.025 MG tablet Take 1-2 tablets by mouth 4 (four) times daily as needed for diarrhea or loose stools. 120 tablet 2   enoxaparin (LOVENOX) 100 MG/ML injection Inject 0.9 mLs (90 mg total) into the skin daily. 10 Syringe 1   EPINEPHrine 0.3 mg/0.3 mL IJ SOAJ injection USE AS  DIRECTED 2 Device 1   ferrous sulfate 325 (65 FE) MG tablet Take 1 tablet (325 mg total) by mouth 3 (three) times daily after meals. (Patient taking differently: Take 325 mg by mouth every other day. )  3   fluticasone (FLONASE) 50 MCG/ACT nasal spray Place 2 sprays into both nostrils daily. (Patient taking differently: Place 2 sprays into both nostrils daily as needed for allergies or rhinitis. ) 48 g 3   lacosamide (VIMPAT) 50 MG TABS tablet Take 1 tablet (50 mg total) by mouth 2 (two) times daily. 180 tablet 1   losartan (COZAAR) 100 MG tablet TAKE 1 TABLET BY MOUTH DAILY. 90 tablet 3   methylPREDNISolone (MEDROL) 4 MG tablet Take 6 tabs po day 1, 5 tabs po day 2, 4 tabs po day 3, 3 tabs po day 4, 2 tabs po day 5, 1 tab po day 6 then stop 21 tablet 0   nitrofurantoin, macrocrystal-monohydrate, (MACROBID) 100 MG capsule Take 1 capsule (100 mg total) by mouth 2 (two) times daily. 20 capsule 0   Probiotic Product (ALIGN PO) Take by mouth. One cap by mouth Bid.     valACYclovir (VALTREX) 1000 MG tablet Take 1 tablet (1,000 mg total) by mouth 2 (two) times daily. 180 tablet 1   warfarin (COUMADIN) 5 MG tablet Take 1 1/2 tablets daily or as directed by anticoagulation clinic. 135 tablet 0   zolpidem (AMBIEN) 10 MG tablet 1 & 1/2 TABLETS (15MG) BY MOUTH AT BEDTIME AS NEEDED FOR SLEEP, OK TOREPEAT IN 4HRS 135 tablet 1   No current facility-administered medications for this visit.      OBJECTIVE: Older  white woman in no acute distress  There were no vitals filed for this visit.   There is no height or weight on file to calculate BMI.   Wt Readings from Last 3 Encounters:  08/26/18 133 lb (60.3 kg)  08/25/18 133 lb 9.6 oz (60.6 kg)  08/09/18 138 lb 14.4 oz (63 kg)      ECOG FS:1 - Symptomatic but completely ambulatory  Ocular: Sclerae unicteric, EOMs intact Ear-nose-throat: Oropharynx clear and moist Lymphatic: No cervical or supraclavicular adenopathy Lungs no rales or rhonchi Heart regular rate and rhythm Abd soft, nontender, positive bowel sounds MSK no focal spinal tenderness, no joint edema Neuro: non-focal, well-oriented, appropriate affect Breasts: I do not palpate a mass in either breast.  There are no skin or nipple changes of concern.  Both axillae are benign.   LAB RESULTS:  CMP     Component Value Date/Time   NA 131 (L) 08/26/2018 1413   NA 136 08/18/2016 1148   NA 132 (L) 03/21/2014 1100   K 4.8 08/26/2018 1413   K 4.9 03/21/2014 1100   CL 98 08/26/2018 1413   CL 97 (L) 03/16/2012 1344   CO2 25 08/26/2018 1413   CO2 26 03/21/2014 1100   GLUCOSE 117 (H) 08/26/2018 1413   GLUCOSE 78 03/21/2014 1100   GLUCOSE 84 03/16/2012 1344   BUN 20 08/26/2018 1413   BUN 21 08/18/2016 1148   BUN 16.0 03/21/2014 1100   CREATININE 0.93 08/26/2018 1413   CREATININE 0.9 03/21/2014 1100   CALCIUM 9.6 08/26/2018 1413   CALCIUM 9.9 03/21/2014 1100   PROT 6.9 08/05/2018 1004   PROT 7.3 08/18/2016 1148   PROT 7.3 03/21/2014 1100   ALBUMIN 3.7 08/05/2018 1004   ALBUMIN 4.3 08/18/2016 1148   ALBUMIN 3.5 03/21/2014 1100   AST 26 08/05/2018 1004  AST 22 03/21/2014 1100   ALT 21 08/05/2018 1004   ALT 15 03/21/2014 1100   ALKPHOS 46 08/05/2018 1004   ALKPHOS 51 03/21/2014 1100   BILITOT 0.7 08/05/2018 1004   BILITOT 0.5 08/18/2016 1148   BILITOT 0.36 03/21/2014 1100   GFRNONAA 60 (L) 08/05/2018 1004   GFRAA >60 08/05/2018 1004    Lab Results  Component Value Date    TOTALPROTELP 6.7 10/21/2011    No results found for: KPAFRELGTCHN, LAMBDASER, [facility]  Lab Results  Component Value Date   WBC 7.1 08/05/2018   NEUTROABS 3.9 08/05/2018   HGB 12.4 08/05/2018   HCT 37.5 08/05/2018   MCV 106.5 (H) 08/05/2018   PLT 290 08/05/2018    '@LASTCHEMISTRY' @  No results found for: LABCA2  No components found for: [id number]  No results for input(s): INR in the last 168 hours.  No results found for: LABCA2  No results found for: [id number]  No results found for: HYI502  No results found for: DXA128  No results found for: CA2729  No components found for: HGQUANT  No results found for: CEA1 / No results found for: CEA1   No results found for: AFPTUMOR  No results found for: CHROMOGRNA  No results found for: PSA1  Anti-coag visit on 12/28/2018  Component Date Value Ref Range Status   INR 12/21/2018 2.7  2.0 - 3.0 Final    (this displays the last labs from the last 3 days)  Lab Results  Component Value Date   TOTALPROTELP 6.7 10/21/2011   (this displays SPEP labs)  No results found for: KPAFRELGTCHN, LAMBDASER, KAPLAMBRATIO (kappa/lambda light chains)  No results found for: HGBA, HGBA2QUANT, HGBFQUANT, HGBSQUAN (Hemoglobinopathy evaluation)   Lab Results  Component Value Date   LDH 181 03/17/2011    Lab Results  Component Value Date   IRON 102 10/30/2017   TIBC 310 10/30/2017   IRONPCTSAT 33 10/30/2017   (Iron and TIBC)  Lab Results  Component Value Date   FERRITIN 171 10/30/2017    Urinalysis    Component Value Date/Time   COLORURINE YELLOW 04/20/2018 1400   APPEARANCEUR Cloudy (A) 04/20/2018 1400   LABSPEC 1.010 04/20/2018 1400   PHURINE 6.0 04/20/2018 1400   GLUCOSEU NEGATIVE 04/20/2018 1400   HGBUR LARGE (A) 04/20/2018 1400   BILIRUBINUR NEGATIVE 04/20/2018 1400   BILIRUBINUR neg 06/15/2015 1110   KETONESUR NEGATIVE 04/20/2018 1400   PROTEINUR NEGATIVE 10/20/2017 0630   UROBILINOGEN 0.2 04/20/2018 1400    NITRITE POSITIVE (A) 04/20/2018 1400   LEUKOCYTESUR LARGE (A) 04/20/2018 1400     STUDIES:  No results found.   ELIGIBLE FOR AVAILABLE RESEARCH PROTOCOL: refused COMET   ASSESSMENT: 85 y.o. 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 Specifically we reviewed the biology of the patient's diagnosis and the specifics of her situation.  Tracy Peters understands that in noninvasive ductal carcinoma, also called ductal carcinoma in situ ("DCIS") the breast cancer cells remain trapped in the ducts were they started. They cannot travel to a vital organ. For that reason these cancers in themselves are  not life-threatening.  If the whole breast is removed then all the ducts are removed and since the cancer cells are trapped in the ducts, the cure rate with mastectomy for noninvasive breast cancer is approximately 99%. Nevertheless we recommend lumpectomy, because there is no survival advantage to mastectomy and because the cosmetic result is generally superior with breast conservation.  Since the patient is keeping her breast, there will be some risk of recurrence. The recurrence can only be in the same breast since, again, the cells are trapped in the ducts. There is no connection from one breast to the other. The risk of local recurrence is cut by more than half with radiation, which is standard in this situation.  In estrogen receptor positive cancers like Tracy Peters, anti-estrogens can also be considered. They will further reduce the risk of recurrence  by one half. In addition anti-estrogens will lower the risk of a new breast cancer developing in either breast, also by one half. That risk approaches 1% per year.   However given the size and grade of this tumor I would estimate the risk of local recurrence after radiation to be less than 5%.  Similarly, while the patient's risk of developing another breast cancer will approximate 1 %/year, given her current age a 16% risk would be optimistic.  This means she would have at least 1084% chance of not developing another breast cancer in the future.  Antiestrogens would only minimally affect those numbers but would expose the patient to possible problems with clotting or worsening osteoporosis.  Accordingly I do not recommend antiestrogens in this case.  The plan is for surgery, radiation, then observation.  Tracy Peters does qualify for genetics testing.  In patients who carry a deleterious mutation [for example in a  BRCA gene], the risk of a new breast cancer developing in the future may be sufficiently great that the patient may choose bilateral mastectomies. However if she wishes to keep her breasts in that situation it is safe to do so. That would require intensified screening, which generally means not only yearly mammography but a yearly breast MRI as well.  Again, given the patient's current age, intensified screening would be the option if there is a deleterious mutation found  Tracy Peters has a good understanding of the overall plan. She agrees with it. She knows the goal of treatment in her case is cure. She will call with any problems that may develop before her next visit here, which will be sometime in July.   Magrinat, Virgie Dad, MD  12/29/18 1:52 PM Medical Oncology and Hematology [facility] [address] [place], [place] [address] Tel. [phone]    Fax. [phone]    I, Wilburn Mylar, am acting as scribe for Dr. Virgie Dad. Magrinat.  {Add scribe attestation  statement}

## 2018-12-29 NOTE — Telephone Encounter (Signed)
Called patient regarding upcoming [facility] appointment, left a voicemail and this will be a telephone visit due to no communication to set up virtual visit.

## 2018-12-30 ENCOUNTER — Inpatient Hospital Stay: Payer: Medicare Other | Admitting: Oncology

## 2019-01-05 ENCOUNTER — Encounter: Payer: Self-pay | Admitting: Family Medicine

## 2019-01-05 ENCOUNTER — Ambulatory Visit (INDEPENDENT_AMBULATORY_CARE_PROVIDER_SITE_OTHER): Payer: Medicare Other | Admitting: Family Medicine

## 2019-01-05 ENCOUNTER — Other Ambulatory Visit: Payer: Self-pay

## 2019-01-05 VITALS — BP 172/87 | HR 71 | Temp 98.0°F | Ht 65.0 in | Wt 136.2 lb

## 2019-01-05 DIAGNOSIS — R569 Unspecified convulsions: Secondary | ICD-10-CM

## 2019-01-05 MED ORDER — LACOSAMIDE 50 MG PO TABS: 50.0000 mg | ORAL_TABLET | Freq: Two times a day (BID) | ORAL | 0 refills | Status: DC

## 2019-01-05 NOTE — Patient Instructions (Addendum)
Continue Vimpat 50mg  twice daily  Annual labs with PCP (CBC and CMP)   Follow up in 1 year, sooner if needed   Seizure, Adult A seizure is a sudden burst of abnormal electrical activity in the brain. Seizures usually last from 30 seconds to 2 minutes. They can cause many different symptoms. Usually, seizures are not harmful unless they last a long time. What are the causes? Common causes of this condition include:  Fever or infection.  Conditions that affect the brain, such as: ? A brain abnormality that you were born with. ? A brain or head injury. ? Bleeding in the brain. ? A tumor. ? Stroke. ? Brain disorders such as autism or cerebral palsy.  Low blood sugar.  Conditions that are passed from parent to child (are inherited).  Problems with substances, such as: ? Having a reaction to a drug or a medicine. ? Suddenly stopping the use of a substance (withdrawal). In some cases, the cause may not be known. A person who has repeated seizures over time without a clear cause has a condition called epilepsy. What increases the risk? You are more likely to get this condition if you have:  A family history of epilepsy.  Had a seizure in the past.  A brain disorder.  A history of head injury, lack of oxygen at birth, or strokes. What are the signs or symptoms? There are many types of seizures. The symptoms vary depending on the type of seizure you have. Examples of symptoms during a seizure include:  Shaking (convulsions).  Stiffness in the body.  Passing out (losing consciousness).  Head nodding.  Staring.  Not responding to sound or touch.  Loss of bladder control and bowel control. Some people have symptoms right before and right after a seizure happens. Symptoms before a seizure may include:  Fear.  Worry (anxiety).  Feeling like you may vomit (nauseous).  Feeling like the room is spinning (vertigo).  Feeling like you saw or heard something before  (dj vu).  Odd tastes or smells.  Changes in how you see. You may see flashing lights or spots. Symptoms after a seizure happens can include:  Confusion.  Sleepiness.  Headache.  Weakness on one side of the body. How is this treated? Most seizures will stop on their own in under 5 minutes. In these cases, no treatment is needed. Seizures that last longer than 5 minutes will usually need treatment. Treatment can include:  Medicines given through an IV tube.  Avoiding things that are known to cause your seizures. These can include medicines that you take for another condition.  Medicines to treat epilepsy.  Surgery to stop the seizures. This may be needed if medicines do not help. Follow these instructions at home: Medicines  Take over-the-counter and prescription medicines only as told by your doctor.  Do not eat or drink anything that may keep your medicine from working, such as alcohol. Activity  Do not do any activities that would be dangerous if you had another seizure, like driving or swimming. Wait until your doctor says it is safe for you to do them.  If you live in the [place] ask your local DMV (department of motor vehicles) when you can drive.  Get plenty of rest. Teaching others Teach friends and family what to do when you have a seizure. They should:  Lay you on the ground.  Protect your head and body.  Loosen any tight clothing around your neck.  Turn you on  your side.  Not hold you down.  Not put anything into your mouth.  Know whether or not you need emergency care.  Stay with you until you are better.  General instructions  Contact your doctor each time you have a seizure.  Avoid anything that gives you seizures.  Keep a seizure diary. Write down: ? What you think caused each seizure. ? What you remember about each seizure.  Keep all follow-up visits as told by your doctor. This is important. Contact a doctor if:  You have another  seizure.  You have seizures more often.  There is any change in what happens during your seizures.  You keep having seizures with treatment.  You have symptoms of being sick or having an infection. Get help right away if:  You have a seizure that: ? Lasts longer than 5 minutes. ? Is different than seizures you had before. ? Makes it harder to breathe. ? Happens after you hurt your head.  You have any of these symptoms after a seizure: ? Not being able to speak. ? Not being able to use a part of your body. ? Confusion. ? A bad headache.  You have two or more seizures in a row.  You do not wake up right after a seizure.  You get hurt during a seizure. These symptoms may be an emergency. Do not wait to see if the symptoms will go away. Get medical help right away. Call your local emergency services (911 in the [place] Do not drive yourself to the hospital. Summary  Seizures usually last from 30 seconds to 2 minutes. Usually, they are not harmful unless they last a long time.  Do not eat or drink anything that may keep your medicine from working, such as alcohol.  Teach friends and family what to do when you have a seizure.  Contact your doctor each time you have a seizure. This information is not intended to replace advice given to you by your health care provider. Make sure you discuss any questions you have with your health care provider. Document Released: 11/19/2007 Document Revised: 08/20/2018 Document Reviewed: 08/20/2018 Elsevier Patient Education  [employer].

## 2019-01-05 NOTE — Progress Notes (Signed)
PATIENT: Tracy Peters DOB: [birth year]  REASON FOR VISIT: follow up HISTORY FROM: patient  Chief Complaint  Patient presents with   Follow-up    6 mon f/u. Alone. Rm 2. No new concerns at this time.      HISTORY OF PRESENT ILLNESS: Today 01/06/19 Tracy Peters is a 85 y.o. female here today for follow up of seizure. She was seen last by Dr Leta Baptist and started on lacosamide 50mg  twice daily. Divalproex was stopped due to concerns of tremor. She is very happy with medication change. She is feeling great and tremor is resolved. She denies seizure activity.   HISTORY: (copied from Dr Gladstone Lighter note on 07/07/2018)  UPDATE (07/07/18, [facility]P): Since last visit, had some tremor in hands, and then on her own she reduce depakote to 0.25 tabs in AM. Tremor resolved. No seizures. No alleviating or aggravating factors.  PRIOR HPI (05/19/18): 85 year old female here for evaluation of seizures.  History of hypercoagulable state, on chronic warfarin.  10/19/2017 patient was at home and had a seizure.  She had right-sided convulsions, unresponsiveness.  This lasted for 1 minute.  Husband witnessed the seizure and called 911.  Patient started to wake up by the time paramedics arrived.  Patient was taken to the emergency room and had a second seizure in the hospital.  She was treated with IV Keppra initially and this was changed to Depakote due to side effect of sedation.  Seizure work-up was completed and no specific cause was found.  Patient has chronic hyponatremia, chronic insomnia, and had issues with right hip replacement and pain since early 2019.  Since discharge patient has been taking divalproex 250 mg twice a day but this was reduced to 125 mg twice a day due to LFT elevation.  No seizures.  REVIEW OF SYSTEMS: Out of a complete 14 system review of symptoms, the patient complains only of the following symptoms, leg swelling, cold intolerance, diarrhea, insomnia, snoring,  increase of bladder consult cramps, bruise/bleed easily, anemia, dizziness and all other reviewed systems are negative.  ALLERGIES: Allergies  Allergen Reactions   Bee Venom Anaphylaxis and Other (See Comments)    Yellow jackets, wasps as well   Ivp Dye [Iodinated Diagnostic Agents] Anaphylaxis   Shellfish Allergy Anaphylaxis   Temazepam Other (See Comments)    Severe Hallucinations and Paranoia   Amlodipine Besylate Swelling   Aspirin Other (See Comments)    REACTION: break out in sweats   Atorvastatin Diarrhea   Cholestyramine Other (See Comments)    REACTION: mouth irritation   Codeine Phosphate Nausea And Vomiting    Can take Tramadol ok   Demerol [Meperidine] Other (See Comments)    Severe Hallucination!!!!   Diphenhydramine Hcl Other (See Comments)    hyperactive   Doxycycline Nausea Only   Gentamicin Sulfate Other (See Comments)    REACTION: Shortness of breath    Keppra [Levetiracetam]     Made her too groggy/ sleepy   Meclizine Hcl Other (See Comments)    REACTION: more dizziness on it   Meperidine Hcl Other (See Comments)    Unknown   Metronidazole Other (See Comments)    REACTION: bad taste   Moxifloxacin Other (See Comments)    REACTION: insomnia Able to use Cipro   Sulfamethoxazole Other (See Comments)    Kidney problems   Penicillins Swelling, Rash and Other (See Comments)    Has patient had a PCN reaction causing immediate rash, facial/tongue/throat swelling, SOB or lightheadedness with hypotension:  Yes Has patient had a PCN reaction causing severe rash involving mucus membranes or skin necrosis: No Has patient had a PCN reaction that required hospitalization: No Has patient had a PCN reaction occurring within the last 10 years: No If all of the above answers are "NO", then may proceed with Cephalosporin use.     HOME MEDICATIONS: Outpatient Medications Prior to Visit  Medication Sig Dispense Refill   colesevelam (WELCHOL) 625 MG  tablet Take 2 tablets (1,250 mg total) by mouth 2 (two) times daily with a meal. 120 tablet 11   diphenoxylate-atropine (LOMOTIL) 2.5-0.025 MG tablet Take 1-2 tablets by mouth 4 (four) times daily as needed for diarrhea or loose stools. 120 tablet 2   enoxaparin (LOVENOX) 100 MG/ML injection Inject 0.9 mLs (90 mg total) into the skin daily. 10 Syringe 1   EPINEPHrine 0.3 mg/0.3 mL IJ SOAJ injection USE AS DIRECTED 2 Device 1   ferrous sulfate 325 (65 FE) MG tablet Take 1 tablet (325 mg total) by mouth 3 (three) times daily after meals. (Patient taking differently: Take 325 mg by mouth every other day. )  3   fluticasone (FLONASE) 50 MCG/ACT nasal spray Place 2 sprays into both nostrils daily. (Patient taking differently: Place 2 sprays into both nostrils daily as needed for allergies or rhinitis. ) 48 g 3   losartan (COZAAR) 100 MG tablet TAKE 1 TABLET BY MOUTH DAILY. 90 tablet 3   methylPREDNISolone (MEDROL) 4 MG tablet Take 6 tabs po day 1, 5 tabs po day 2, 4 tabs po day 3, 3 tabs po day 4, 2 tabs po day 5, 1 tab po day 6 then stop 21 tablet 0   nitrofurantoin, macrocrystal-monohydrate, (MACROBID) 100 MG capsule Take 1 capsule (100 mg total) by mouth 2 (two) times daily. 20 capsule 0   Probiotic Product (ALIGN PO) Take by mouth. One cap by mouth Bid.     valACYclovir (VALTREX) 1000 MG tablet Take 1 tablet (1,000 mg total) by mouth 2 (two) times daily. 180 tablet 1   warfarin (COUMADIN) 5 MG tablet Take 1 1/2 tablets daily or as directed by anticoagulation clinic. 135 tablet 0   zolpidem (AMBIEN) 10 MG tablet 1 & 1/2 TABLETS (15MG ) BY MOUTH AT BEDTIME AS NEEDED FOR SLEEP, OK TOREPEAT IN 4HRS 135 tablet 1   lacosamide (VIMPAT) 50 MG TABS tablet Take 1 tablet (50 mg total) by mouth 2 (two) times daily. 180 tablet 1   No facility-administered medications prior to visit.     PAST MEDICAL HISTORY: Past Medical History:  Diagnosis Date   Adenomatous colon polyp    Allergic rhinitis      uses FLonase nightly   Anemia    Arthritis    joint pain   Blood transfusion    Bronchitis    hx of-7-8yrs ago   Carpal tunnel syndrome    numbness and tingling    Cataracts, bilateral    Chronic anticoagulation 03/21/2013   Congenital absence of one kidney    pt only has one kidney   Diarrhea    takes Immodium daily as needed or Lomotil    Diverticulosis    GERD (gastroesophageal reflux disease)    mild   Herpes    takes Valtrex daily   History of colon polyps    History of DVT (deep vein thrombosis) 1998   right arm   History of staph infection    Hyperlipidemia    Medical MD doesn't think its absolutely necessary  Hypertension    takes Losartan daily   IBS (irritable bowel syndrome)    takes Align daily   Insomnia    takes Ambien nightly as needed   LBP (low back pain)    Long term (current) use of anticoagulants    Dr. 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   Right digital artery 1998 idiopathic    Urinary leakage    UTI (urinary tract infection)     PAST SURGICAL HISTORY: Past Surgical History:  Procedure Laterality Date    kidney disease left      ABDOMINAL HYSTERECTOMY     ANTERIOR CERVICAL DECOMP/DISCECTOMY FUSION N/A 01/18/2014   Procedure: ANTERIOR CERVICAL DECOMPRESSION/DISCECTOMY FUSION 3 LEVELS  Cervical  four/five, five/six, six/seven anterior cervical decompression with fusion interbody prosthesis with plating and bonegraft;  Surgeon: Newman Pies, MD;  Location: [facility];  Service: Neurosurgery;  Laterality: N/A;  t   APPENDECTOMY     BACK SURGERY     2   BREAST BIOPSY Left 30+ yrs ago   benign   BREAST LUMPECTOMY WITH RADIOACTIVE SEED LOCALIZATION Left 08/06/2018   Procedure: LEFT BREAST LUMPECTOMY  WITH RADIOACTIVE SEED LOCALIZATION;  Surgeon: Fanny Skates, MD;  Location: [facility];  Service: General;  Laterality: Left;   BREAST SURGERY     cystectomy-benign   CARPAL TUNNEL RELEASE Right 06/12/2014   Procedure: CARPAL TUNNEL RELEASE;  Surgeon: Newman Pies, MD;  Location: [facility];  Service: Neurosurgery;  Laterality: Right;  Right Carpal Tunnel Release   CATARACT EXTRACTION Bilateral 02/15/2015   CHOLECYSTECTOMY     COLONOSCOPY     ECTOPIC PREGNANCY SURGERY  1959   HIP CLOSED REDUCTION Right 08/23/2017   Procedure: CLOSED MANIPULATION HIP;  Surgeon: Nicholes Stairs, MD;  Location: [facility];  Service: Orthopedics;  Laterality: Right;   I&D of abdomen  1988   couple of wks after gallbladder removed   KNEE SURGERY     left x 3   LUMBAR LAMINECTOMY     X 2   mortons neuromas removed     OPEN SURGICAL REPAIR OF GLUTEAL TENDON Right 06/15/2017   Procedure: OPEN SURGICAL REPAIR OF GLUTEALmedius TENDON;  Surgeon: Paralee Cancel, MD;  Location: [facility];  Service: Orthopedics;  Laterality: Right;   SHOULDER SURGERY     Right   TOTAL HIP ARTHROPLASTY Right 06/15/2017   Procedure: Right total hip arthroplasty, bursectomy, repair of gluteal tendon, posterior approach;  Surgeon: Paralee Cancel, MD;  Location: [facility];  Service: Orthopedics;  Laterality: Right;  90 mins for all procedures together    FAMILY HISTORY: Family History  Problem Relation Age of Onset   Breast cancer Sister    Kidney disease Sister 43       nephritis   Parkinsonism Mother    Heart disease Brother    Osteoarthritis Other    Ulcerative colitis Sister    Kidney disease Sister    Breast cancer Sister    Colon cancer Neg Hx     SOCIAL HISTORY: Social History   Socioeconomic History   Marital status: Married    Spouse name: Not on file   Number of children: 2   Years of education: Not on file  Highest education level: Not on file  Occupational History     Occupation: Retired    Fish farm manager: RETIRED  Scientist, product/process development strain: Not on file   Food insecurity    Worry: Not on file    Inability: Not on file   Transportation needs    Medical: No    Non-medical: No  Tobacco Use   Smoking status: Never Smoker   Smokeless tobacco: Never Used  Substance and Sexual Activity   Alcohol use: No    Alcohol/week: 0.0 standard drinks   Drug use: No   Sexual activity: Never  Lifestyle   Physical activity    Days per week: Not on file    Minutes per session: Not on file   Stress: Not on file  Relationships   Social connections    Talks on phone: Not on file    Gets together: Not on file    Attends religious service: Not on file    Active member of club or organization: Not on file    Attends meetings of clubs or organizations: Not on file    Relationship status: Not on file   Intimate partner violence    Fear of current or ex partner: Not on file    Emotionally abused: Not on file    Physically abused: Not on file    Forced sexual activity: Not on file  Other Topics Concern   Not on file  Social History Narrative   Lives home with husband.  Education- graduate level.  Children 2.        PHYSICAL EXAM  Vitals:   01/05/19 1356  BP: (!) 172/87  Pulse: 71  Temp: 98 F (36.7 C)  TempSrc: Oral  Weight: 136 lb 3.2 oz (61.8 kg)  Height: 5\' 5"  (1.651 m)   Body mass index is 22.66 kg/m.  Generalized: Well developed, in no acute distress  Cardiology: normal rate and rhythm, no murmur noted Neurological examination  Mentation: Alert oriented to time, place, history taking. Follows all commands speech and language fluent Cranial nerve II-XII: Pupils were equal round reactive to light. Extraocular movements were full, visual field were full on confrontational test. Facial sensation and strength were normal. Uvula tongue midline. Head turning and shoulder shrug  were normal and symmetric. Motor: The motor  testing reveals 5 over 5 strength of all 4 extremities. Good symmetric motor tone is noted throughout.  Coordination: Cerebellar testing reveals good finger-nose-finger and heel-to-shin bilaterally.  Gait and station: Gait is normal.   DIAGNOSTIC DATA (LABS, IMAGING, TESTING) - I reviewed patient records, labs, notes, testing and imaging myself where available.  No flowsheet data found.   Lab Results  Component Value Date   WBC 7.1 08/05/2018   HGB 12.4 08/05/2018   HCT 37.5 08/05/2018   MCV 106.5 (H) 08/05/2018   PLT 290 08/05/2018      Component Value Date/Time   NA 131 (L) 08/26/2018 1413   NA 136 08/18/2016 1148   NA 132 (L) 03/21/2014 1100   K 4.8 08/26/2018 1413   K 4.9 03/21/2014 1100   CL 98 08/26/2018 1413   CL 97 (L) 03/16/2012 1344   CO2 25 08/26/2018 1413   CO2 26 03/21/2014 1100   GLUCOSE 117 (H) 08/26/2018 1413   GLUCOSE 78 03/21/2014 1100   GLUCOSE 84 03/16/2012 1344   BUN 20 08/26/2018 1413   BUN 21 08/18/2016 1148   BUN 16.0 03/21/2014 1100   CREATININE 0.93 08/26/2018 1413  CREATININE 0.9 03/21/2014 1100   CALCIUM 9.6 08/26/2018 1413   CALCIUM 9.9 03/21/2014 1100   PROT 6.9 08/05/2018 1004   PROT 7.3 08/18/2016 1148   PROT 7.3 03/21/2014 1100   ALBUMIN 3.7 08/05/2018 1004   ALBUMIN 4.3 08/18/2016 1148   ALBUMIN 3.5 03/21/2014 1100   AST 26 08/05/2018 1004   AST 22 03/21/2014 1100   ALT 21 08/05/2018 1004   ALT 15 03/21/2014 1100   ALKPHOS 46 08/05/2018 1004   ALKPHOS 51 03/21/2014 1100   BILITOT 0.7 08/05/2018 1004   BILITOT 0.5 08/18/2016 1148   BILITOT 0.36 03/21/2014 1100   GFRNONAA 60 (L) 08/05/2018 1004   GFRAA >60 08/05/2018 1004   Lab Results  Component Value Date   CHOL 196 01/03/2016   HDL 63.10 01/03/2016   LDLCALC 116 (H) 01/03/2016   LDLDIRECT 129.8 05/31/2012   TRIG 85.0 01/03/2016   CHOLHDL 3 01/03/2016   No results found for: HGBA1C Lab Results  Component Value Date   VITAMINB12 351 10/21/2017   Lab Results    Component Value Date   TSH 2.52 04/20/2018    ASSESSMENT AND PLAN 85 y.o. year old female  has a past medical history of Adenomatous colon polyp, Allergic rhinitis, Anemia, Arthritis, Blood transfusion, Bronchitis, Carpal tunnel syndrome, Cataracts, bilateral, Chronic anticoagulation (03/21/2013), Congenital absence of one kidney, Diarrhea, Diverticulosis, GERD (gastroesophageal reflux disease), Herpes, History of colon polyps, History of DVT (deep vein thrombosis) (1998), History of staph infection, Hyperlipidemia, Hypertension, IBS (irritable bowel syndrome), Insomnia, LBP (low back pain), Long term (current) use of anticoagulants, Nocturia, Osteoporosis, Raynaud's disease, 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 here with     ICD-10-CM   1. Seizure ([place])  R56.9     Jaselynn reports doing much better on lacosamide.  No seizure activity noted since last visit.  We will continue glucosamine 50 mg twice daily.  She was encouraged to continue regular follow-up with PCP for screening labs and best practice advisories.  She will follow-up with me in 1 year, sooner if needed.  She verbalizes understanding and agreement with this plan.   No orders of the defined types were placed in this encounter.    Meds ordered this encounter  Medications   lacosamide (VIMPAT) 50 MG TABS tablet    Sig: Take 1 tablet (50 mg total) by mouth 2 (two) times daily.    Dispense:  180 tablet    Refill:  0    Order Specific Question:   Supervising Provider    Answer:   Melvenia Beam [id number]      I spent 15 minutes with the patient. 50% of this time was spent counseling and educating patient on plan of care and medications.    Tracy Presto, FNP-C 01/06/2019, 8:22 AM [facility] [address] [place], [place] [address] [phone]

## 2019-01-06 ENCOUNTER — Encounter: Payer: Self-pay | Admitting: Family Medicine

## 2019-01-11 NOTE — Progress Notes (Signed)
I reviewed note and agree with plan. Continue lacosamide 50mg  twice a day.   Penni Bombard, MD 4/49/7530, 0:51 AM Certified in Neurology, Neurophysiology and Neuroimaging  [facility] [address] [place], [place] [address] [phone]

## 2019-01-19 ENCOUNTER — Telehealth: Payer: Self-pay | Admitting: Oncology

## 2019-01-19 NOTE — Progress Notes (Signed)
Tracy Peters  [phone] [phone]) [phone]    ID: Tracy Peters DOB: 03-21-1934  MR#: [id number]  GMW#:[id number]  Patient Care Team: Tresa Garter, MD as PCP - General Francena Hanly, MD (Orthopedic Surgery) Levert Feinstein, MD (Hematology and Oncology) Richarda Overlie, MD (Obstetrics and Gynecology) Hart Carwin, MD (Inactive) (Gastroenterology) Tressie Stalker, MD as Consulting Physician (Neurosurgery) Ollen Gross, MD as Consulting Physician (Orthopedic Surgery) Durene Romans, MD as Consulting Physician (Orthopedic Surgery) Lamonta Cypress, Valentino Hue, MD as Consulting Physician (Oncology) Claud Kelp, MD as Consulting Physician (General Surgery) OTHER MD:  I connected with Tracy Peters on 01/20/19 at 10:30 AM EDT by telephone visit and verified that I am speaking with the correct person using two identifiers.   I discussed the limitations, risks, security and privacy concerns of performing an evaluation and management service by telemedicine and the availability of in-person appointments. I also discussed with the patient that there may be a patient responsible charge related to this service. The patient expressed understanding and agreed to proceed.   Other persons participating in the visit and their role in the encounter: Tracy Peters, scribe   Patient's location: home  Provider's location: [facility]    CHIEF COMPLAINT: ductal carcinoma is situ, estrogen receptor positive  CURRENT TREATMENT: Observation   INTERVAL HISTORY: Tracy Peters was called today for follow up of her noninvasive breast cancer.  Since consultation, she opted to proceed with left breast lumpectomy on 08/06/2018 under Dr. Derrell Lolling. Pathology from the procedure [id number]) was benign, showing fibrocystic changes with calcifications.  She underwent radiation treatments from 09/06/2018 - 10/01/2018 under Dr. Mitzi Hansen. She reports she  experienced nausea, noting she got through and feels better.   REVIEW OF SYSTEMS: [name] reports following surgery, she experienced expected swelling and wore her compression bras for a while. Her husband does the shopping, otherwise they are staying in. She reports she walks with her husband and dogs around the house and the yard. Her dogs are Yorkies and keep her busy. A detailed review of systems was otherwise entirely negative.    HISTORY OF CURRENT ILLNESS: Tracy Peters underwent routine screening mammography in early 06/2018 showing a change in her left breast. She underwent unilateral left diagnostic mammography with tomography at [facility] on 07/06/2018 showing: Breast Density Category C. There is a 0.7 cm group of amorphous calcifications within the lower inner left breast posterior depth.  Accordingly on 07/09/2018 she proceeded to biopsy of the left breast area in question. The pathology from this procedure showed ([id number]): ductal carcinoma in situ, low nuclear grade with calcifications. Prognostic indicators significant for: estrogen receptor, 100% positive with strong staining intensity and progesterone receptor, 10% positive, with moderate staining intensity.   The patient's subsequent history is as detailed below.   PAST MEDICAL HISTORY: Past Medical History:  Diagnosis Date  . Adenomatous colon polyp   . Allergic rhinitis    uses FLonase nightly  . Anemia   . Arthritis    joint pain  . Blood transfusion   . Bronchitis    hx of-7-62yrs ago  . Carpal tunnel syndrome    numbness and tingling   . Cataracts, bilateral   . Chronic anticoagulation 03/21/2013  . Congenital absence of one kidney    pt only has one kidney  . Diarrhea    takes Immodium daily as needed or Lomotil   . Diverticulosis   . GERD (gastroesophageal reflux disease)    mild  .  Herpes    takes Valtrex daily  . History of colon polyps   . History of DVT (deep vein thrombosis)  1998   right arm  . History of staph infection   . Hyperlipidemia    Medical MD doesn't think its absolutely necessary  . Hypertension    takes Losartan daily  . IBS (irritable bowel syndrome)    takes Librarian, academic daily  . Insomnia    takes Ambien nightly as needed  . LBP (low back pain)   . Long term (current) use of anticoagulants    Dr. Cyndie Chime  . Nocturia   . Osteoporosis   . Raynaud's disease   . Sarcoma of left lower extremity (HCC)    Dr. Elie Confer  . Seizures (HCC) 10/20/2017   has not had a seizure since may 2019  . 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 Urinary leakage   . UTI (urinary tract infection)      PAST SURGICAL HISTORY: Past Surgical History:  Procedure Laterality Date  .  kidney disease left     . ABDOMINAL HYSTERECTOMY    . ANTERIOR CERVICAL DECOMP/DISCECTOMY FUSION N/A 01/18/2014   Procedure: ANTERIOR CERVICAL DECOMPRESSION/DISCECTOMY FUSION 3 LEVELS  Cervical  four/five, five/six, six/seven anterior cervical decompression with fusion interbody prosthesis with plating and bonegraft;  Surgeon: Tressie Stalker, MD;  Location: [facility];  Service: Neurosurgery;  Laterality: N/A;  t  . APPENDECTOMY    . BACK SURGERY     2  . BREAST BIOPSY Left 30+ yrs ago   benign  . BREAST LUMPECTOMY WITH RADIOACTIVE SEED LOCALIZATION Left 08/06/2018   Procedure: LEFT BREAST LUMPECTOMY WITH RADIOACTIVE SEED LOCALIZATION;  Surgeon: Claud Kelp, MD;  Location: [facility];  Service: General;  Laterality: Left;  . BREAST SURGERY     cystectomy-benign  . CARPAL TUNNEL RELEASE Right 06/12/2014   Procedure: CARPAL TUNNEL RELEASE;  Surgeon: Tressie Stalker, MD;  Location: [facility];  Service: Neurosurgery;  Laterality: Right;  Right Carpal Tunnel Release  . CATARACT EXTRACTION Bilateral 02/15/2015  . CHOLECYSTECTOMY    . COLONOSCOPY    . ECTOPIC PREGNANCY SURGERY  1959  . HIP CLOSED REDUCTION  Right 08/23/2017   Procedure: CLOSED MANIPULATION HIP;  Surgeon: Yolonda Kida, MD;  Location: [facility];  Service: Orthopedics;  Laterality: Right;  . I&D of abdomen  1988   couple of wks after gallbladder removed  . KNEE SURGERY     left x 3  . LUMBAR LAMINECTOMY     X 2  . mortons neuromas removed    . OPEN SURGICAL REPAIR OF GLUTEAL TENDON Right 06/15/2017   Procedure: OPEN SURGICAL REPAIR OF GLUTEALmedius TENDON;  Surgeon: Durene Romans, MD;  Location: [facility];  Service: Orthopedics;  Laterality: Right;  . SHOULDER SURGERY     Right  . TOTAL HIP ARTHROPLASTY Right 06/15/2017   Procedure: Right total hip arthroplasty, bursectomy, repair of gluteal tendon, posterior approach;  Surgeon: Durene Romans, MD;  Location: [facility];  Service: Orthopedics;  Laterality: Right;  90 mins for all procedures together     FAMILY HISTORY: Family History  Problem Relation Age of Onset  . Breast cancer Sister   . Kidney disease Sister 7       nephritis  . Parkinsonism Mother   . Heart disease Brother   . Osteoarthritis Other   . Ulcerative colitis Sister   . Kidney disease Sister   .  Breast cancer Sister   . Colon cancer Neg Hx    Tracy Peters's father died from complications after a fall at age 69. Patients' mother died from natural causes at age 82. The patient has 3 brothers and 6 sisters. Tracy Peters has a sister that was diagnosed with breast cancer in her mid 4's and two nephews that were diagnosed with prostate cancer. Patient denies anyone in her family having ovarian or pancreatic cancer. Tracy Peters has a brother that was diagnosed with testicular cancer at 45.    GYNECOLOGIC HISTORY:  No LMP recorded. Patient has had a hysterectomy. 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 Her husband, Elijah Birk, is the Psychologist, prison and probation services for [employer]. Aleni has two children, Tracy Peters and [place]. Tracy Peters is 26, lives in [place], and works at [employer]. Tracy Peters is 53, lives in [place], and is an Art gallery manager. Tracy Peters has two blood grandchildren and one step-grandchild. She is a International aid/development worker.    ADVANCED DIRECTIVES: Her husband, Elijah Birk, is automatically her healthcare power of attorney.     HEALTH MAINTENANCE: Social History   Tobacco Use  . Smoking status: Never Smoker  . Smokeless tobacco: Never Used  Substance Use Topics  . Alcohol use: No    Alcohol/week: 0.0 standard drinks  . Drug use: No    Colonoscopy: Due in 2022  PAP: Hysterectomy, pelvic exams only  Bone density: yes, "bone loss" - prescribed injections, but she didn't receive them.   Allergies  Allergen Reactions  . Bee Venom Anaphylaxis and Other (See Comments)    Yellow jackets, wasps as well  . Ivp Dye [Iodinated Diagnostic Agents] Anaphylaxis  . Shellfish Allergy Anaphylaxis  . Temazepam Other (See Comments)    Severe Hallucinations and Paranoia  . Amlodipine Besylate Swelling  . Aspirin Other (See Comments)    REACTION: break out in sweats  . Atorvastatin Diarrhea  . Cholestyramine Other (See Comments)    REACTION: mouth irritation  . Codeine Phosphate Nausea And Vomiting    Can take Tramadol ok  . Demerol [Meperidine] Other (See Comments)    Severe Hallucination!!!!  . Diphenhydramine Hcl Other (See Comments)    hyperactive  . Doxycycline Nausea Only  . Gentamicin Sulfate Other (See Comments)    REACTION: Shortness of breath   . Keppra [Levetiracetam]     Made her too groggy/ sleepy  . Meclizine Hcl Other (See Comments)    REACTION: more dizziness on it  . Meperidine Hcl Other (See Comments)    Unknown  . Metronidazole Other (See Comments)    REACTION: bad taste  .  Moxifloxacin Other (See Comments)    REACTION: insomnia Able to use Cipro  . Sulfamethoxazole Other (See Comments)    Kidney problems  . Penicillins Swelling, Rash and Other (See Comments)    Has patient had a PCN reaction causing immediate rash, facial/tongue/throat swelling, SOB or lightheadedness with hypotension: Yes Has patient had a PCN reaction causing severe rash involving mucus membranes or skin necrosis: No Has patient had a PCN reaction that required hospitalization: No Has patient had a PCN reaction occurring within the last 10 years: No If all of the above answers are "NO",  then may proceed with Cephalosporin use.     Current Outpatient Medications  Medication Sig Dispense Refill  . colesevelam (WELCHOL) 625 MG tablet Take 2 tablets (1,250 mg total) by mouth 2 (two) times daily with a meal. 120 tablet 11  . diphenoxylate-atropine (LOMOTIL) 2.5-0.025 MG tablet Take 1-2 tablets by mouth 4 (four) times daily as needed for diarrhea or loose stools. 120 tablet 2  . enoxaparin (LOVENOX) 100 MG/ML injection Inject 0.9 mLs (90 mg total) into the skin daily. 10 Syringe 1  . EPINEPHrine 0.3 mg/0.3 mL IJ SOAJ injection USE AS DIRECTED 2 Device 1  . ferrous sulfate 325 (65 FE) MG tablet Take 1 tablet (325 mg total) by mouth 3 (three) times daily after meals. (Patient taking differently: Take 325 mg by mouth every other day. )  3  . fluticasone (FLONASE) 50 MCG/ACT nasal spray Place 2 sprays into both nostrils daily. (Patient taking differently: Place 2 sprays into both nostrils daily as needed for allergies or rhinitis. ) 48 g 3  . lacosamide (VIMPAT) 50 MG TABS tablet Take 1 tablet (50 mg total) by mouth 2 (two) times daily. 180 tablet 0  . losartan (COZAAR) 100 MG tablet TAKE 1 TABLET BY MOUTH DAILY. 90 tablet 3  . methylPREDNISolone (MEDROL) 4 MG tablet Take 6 tabs po day 1, 5 tabs po day 2, 4 tabs po day 3, 3 tabs po day 4, 2 tabs po day 5, 1 tab po day 6 then stop 21 tablet 0  .  nitrofurantoin, macrocrystal-monohydrate, (MACROBID) 100 MG capsule Take 1 capsule (100 mg total) by mouth 2 (two) times daily. 20 capsule 0  . Probiotic Product (ALIGN PO) Take by mouth. One cap by mouth Bid.    . valACYclovir (VALTREX) 1000 MG tablet Take 1 tablet (1,000 mg total) by mouth 2 (two) times daily. 180 tablet 1  . warfarin (COUMADIN) 5 MG tablet Take 1 1/2 tablets daily or as directed by anticoagulation clinic. 135 tablet 0  . zolpidem (AMBIEN) 10 MG tablet 1 & 1/2 TABLETS (15MG ) BY MOUTH AT BEDTIME AS NEEDED FOR SLEEP, OK TOREPEAT IN 4HRS 135 tablet 1   No current facility-administered medications for this visit.      OBJECTIVE: Older white woman contacted by phone  There were no vitals filed for this visit.   There is no height or weight on file to calculate BMI.   Wt Readings from Last 3 Encounters:  01/05/19 136 lb 3.2 oz (61.8 kg)  08/26/18 133 lb (60.3 kg)  08/25/18 133 lb 9.6 oz (60.6 kg)      ECOG FS:1 - Symptomatic but completely ambulatory  Deferred for virtual visit.   LAB RESULTS:  CMP     Component Value Date/Time   NA 131 (L) 08/26/2018 1413   NA 136 08/18/2016 1148   NA 132 (L) 03/21/2014 1100   K 4.8 08/26/2018 1413   K 4.9 03/21/2014 1100   CL 98 08/26/2018 1413   CL 97 (L) 03/16/2012 1344   CO2 25 08/26/2018 1413   CO2 26 03/21/2014 1100   GLUCOSE 117 (H) 08/26/2018 1413   GLUCOSE 78 03/21/2014 1100   GLUCOSE 84 03/16/2012 1344   BUN 20 08/26/2018 1413   BUN 21 08/18/2016 1148   BUN 16.0 03/21/2014 1100   CREATININE 0.93 08/26/2018 1413   CREATININE 0.9 03/21/2014 1100   CALCIUM 9.6 08/26/2018 1413   CALCIUM 9.9 03/21/2014 1100   PROT 6.9 08/05/2018 1004   PROT 7.3 08/18/2016  1148   PROT 7.3 03/21/2014 1100   ALBUMIN 3.7 08/05/2018 1004   ALBUMIN 4.3 08/18/2016 1148   ALBUMIN 3.5 03/21/2014 1100   AST 26 08/05/2018 1004   AST 22 03/21/2014 1100   ALT 21 08/05/2018 1004   ALT 15 03/21/2014 1100   ALKPHOS 46 08/05/2018 1004    ALKPHOS 51 03/21/2014 1100   BILITOT 0.7 08/05/2018 1004   BILITOT 0.5 08/18/2016 1148   BILITOT 0.36 03/21/2014 1100   GFRNONAA 60 (L) 08/05/2018 1004   GFRAA >60 08/05/2018 1004    Lab Results  Component Value Date   TOTALPROTELP 6.7 10/21/2011    No results found for: KPAFRELGTCHN, LAMBDASER, [facility]  Lab Results  Component Value Date   WBC 7.1 08/05/2018   NEUTROABS 3.9 08/05/2018   HGB 12.4 08/05/2018   HCT 37.5 08/05/2018   MCV 106.5 (H) 08/05/2018   PLT 290 08/05/2018    @LASTCHEMISTRY @  No results found for: LABCA2  No components found for: [id number]  No results for input(s): INR in the last 168 hours.  No results found for: LABCA2  No results found for: WUJ811  No results found for: BJY782  No results found for: NFA213  No results found for: CA2729  No components found for: HGQUANT  No results found for: CEA1 / No results found for: CEA1   No results found for: AFPTUMOR  No results found for: CHROMOGRNA  No results found for: PSA1  No visits with results within 3 Day(s) from this visit.  Latest known visit with results is:  Anti-coag visit on 12/28/2018  Component Date Value Ref Range Status  . INR 12/21/2018 2.7  2.0 - 3.0 Final    (this displays the last labs from the last 3 days)  Lab Results  Component Value Date   TOTALPROTELP 6.7 10/21/2011   (this displays SPEP labs)  No results found for: KPAFRELGTCHN, LAMBDASER, KAPLAMBRATIO (kappa/lambda light chains)  No results found for: HGBA, HGBA2QUANT, HGBFQUANT, HGBSQUAN (Hemoglobinopathy evaluation)   Lab Results  Component Value Date   LDH 181 03/17/2011    Lab Results  Component Value Date   IRON 102 10/30/2017   TIBC 310 10/30/2017   IRONPCTSAT 33 10/30/2017   (Iron and TIBC)  Lab Results  Component Value Date   FERRITIN 171 10/30/2017    Urinalysis    Component Value Date/Time   COLORURINE YELLOW 04/20/2018 1400   APPEARANCEUR Cloudy (A) 04/20/2018 1400    LABSPEC 1.010 04/20/2018 1400   PHURINE 6.0 04/20/2018 1400   GLUCOSEU NEGATIVE 04/20/2018 1400   HGBUR LARGE (A) 04/20/2018 1400   BILIRUBINUR NEGATIVE 04/20/2018 1400   BILIRUBINUR neg 06/15/2015 1110   KETONESUR NEGATIVE 04/20/2018 1400   PROTEINUR NEGATIVE 10/20/2017 0630   UROBILINOGEN 0.2 04/20/2018 1400   NITRITE POSITIVE (A) 04/20/2018 1400   LEUKOCYTESUR LARGE (A) 04/20/2018 1400     STUDIES:  No results found.   ELIGIBLE FOR AVAILABLE RESEARCH PROTOCOL: refused COMET   ASSESSMENT: 85 y.o. 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  She understands this is not a life-threatening cancer and that her chance of recurrence is very low.  We discussed antiestrogens, which could be used for prophylaxis.  Unfortunately especially in someone her age with her comorbidities there are significant concerns regarding osteoporosis, blood clots, and other problems.  Accordingly I do not recommend that she receive antiestrogens at this point.  All she really needs is her mammography yearly and a yearly physician breast exam.  She tells me that she always gets her breast exam through Dr. Marcelle Overlie and that she sees them frequently.  Accordingly I feel comfortable releasing her to Dr. Marcelle Overlie and Dr. Loren Racer care.  I will be glad to see Tracy Peters again at any point in the future if and when the need arises but as of now are making no further routine appointments for her here.   Clatie Kessen, Valentino Hue, MD  01/20/19 10:37 AM Medical Oncology and Hematology [facility] [address] [place], [place] [address] Tel. [phone]    Fax. [phone]    I, Tracy Peters, am acting as scribe for Dr. Valentino Hue. Carmeron Heady.  I, [facility] MD, have reviewed the above documentation for accuracy and completeness, and I agree with the above.

## 2019-01-20 ENCOUNTER — Inpatient Hospital Stay: Payer: Medicare Other | Attending: Oncology | Admitting: Oncology

## 2019-01-20 DIAGNOSIS — I742 Embolism and thrombosis of arteries of the upper extremities: Secondary | ICD-10-CM | POA: Diagnosis not present

## 2019-01-20 DIAGNOSIS — D0512 Intraductal carcinoma in situ of left breast: Secondary | ICD-10-CM

## 2019-01-20 NOTE — Addendum Note (Signed)
Addended by: Chauncey Cruel on: 01/20/2019 10:42 AM   Modules accepted: Orders

## 2019-01-24 ENCOUNTER — Encounter: Payer: Self-pay | Admitting: *Deleted

## 2019-01-26 ENCOUNTER — Telehealth: Payer: Self-pay | Admitting: Adult Health

## 2019-01-26 NOTE — Telephone Encounter (Signed)
Scheduled appt per 8/10 sch message - unable to reach pt  Left message and sent reminder letter in the mail.

## 2019-02-02 DIAGNOSIS — D485 Neoplasm of uncertain behavior of skin: Secondary | ICD-10-CM | POA: Diagnosis not present

## 2019-02-02 DIAGNOSIS — C44722 Squamous cell carcinoma of skin of right lower limb, including hip: Secondary | ICD-10-CM | POA: Diagnosis not present

## 2019-02-02 DIAGNOSIS — L570 Actinic keratosis: Secondary | ICD-10-CM | POA: Diagnosis not present

## 2019-02-08 ENCOUNTER — Ambulatory Visit: Payer: Medicare Other

## 2019-02-12 ENCOUNTER — Ambulatory Visit (INDEPENDENT_AMBULATORY_CARE_PROVIDER_SITE_OTHER): Payer: Medicare Other

## 2019-02-12 ENCOUNTER — Other Ambulatory Visit: Payer: Self-pay

## 2019-02-12 DIAGNOSIS — Z23 Encounter for immunization: Secondary | ICD-10-CM

## 2019-02-14 DIAGNOSIS — L570 Actinic keratosis: Secondary | ICD-10-CM | POA: Diagnosis not present

## 2019-02-14 DIAGNOSIS — D485 Neoplasm of uncertain behavior of skin: Secondary | ICD-10-CM | POA: Diagnosis not present

## 2019-02-14 DIAGNOSIS — C44722 Squamous cell carcinoma of skin of right lower limb, including hip: Secondary | ICD-10-CM | POA: Diagnosis not present

## 2019-02-15 ENCOUNTER — Ambulatory Visit (INDEPENDENT_AMBULATORY_CARE_PROVIDER_SITE_OTHER): Payer: Medicare Other | Admitting: General Practice

## 2019-02-15 ENCOUNTER — Other Ambulatory Visit: Payer: Self-pay

## 2019-02-15 DIAGNOSIS — D6859 Other primary thrombophilia: Secondary | ICD-10-CM

## 2019-02-15 DIAGNOSIS — Z7901 Long term (current) use of anticoagulants: Secondary | ICD-10-CM

## 2019-02-15 DIAGNOSIS — Z86718 Personal history of other venous thrombosis and embolism: Secondary | ICD-10-CM

## 2019-02-15 DIAGNOSIS — I742 Embolism and thrombosis of arteries of the upper extremities: Secondary | ICD-10-CM

## 2019-02-15 LAB — POCT INR: INR: 2.8 (ref 2.0–3.0)

## 2019-02-15 NOTE — Patient Instructions (Addendum)
Pre visit review using our clinic review tool, if applicable. No additional management support is needed unless otherwise documented below in the visit note.  Continue to take one-and one-half (1 &1/2) tablets of your 5mg  peach-colored warfarin tablets by mouth every day of the week.  Re-check in 4 weeks.

## 2019-02-22 ENCOUNTER — Ambulatory Visit (INDEPENDENT_AMBULATORY_CARE_PROVIDER_SITE_OTHER): Payer: Medicare Other | Admitting: Internal Medicine

## 2019-02-22 ENCOUNTER — Encounter: Payer: Self-pay | Admitting: Internal Medicine

## 2019-02-22 ENCOUNTER — Other Ambulatory Visit: Payer: Self-pay

## 2019-02-22 DIAGNOSIS — L0390 Cellulitis, unspecified: Secondary | ICD-10-CM | POA: Insufficient documentation

## 2019-02-22 DIAGNOSIS — L03115 Cellulitis of right lower limb: Secondary | ICD-10-CM

## 2019-02-22 DIAGNOSIS — I10 Essential (primary) hypertension: Secondary | ICD-10-CM | POA: Diagnosis not present

## 2019-02-22 DIAGNOSIS — G4700 Insomnia, unspecified: Secondary | ICD-10-CM | POA: Diagnosis not present

## 2019-02-22 DIAGNOSIS — I742 Embolism and thrombosis of arteries of the upper extremities: Secondary | ICD-10-CM

## 2019-02-22 DIAGNOSIS — Z7901 Long term (current) use of anticoagulants: Secondary | ICD-10-CM

## 2019-02-22 MED ORDER — AZITHROMYCIN 250 MG PO TABS: ORAL_TABLET | ORAL | 0 refills | Status: DC

## 2019-02-22 MED ORDER — WARFARIN SODIUM 5 MG PO TABS: ORAL_TABLET | ORAL | 1 refills | Status: DC

## 2019-02-22 MED ORDER — ZOLPIDEM TARTRATE 10 MG PO TABS: ORAL_TABLET | ORAL | 1 refills | Status: DC

## 2019-02-22 MED ORDER — MUPIROCIN 2 % EX OINT: TOPICAL_OINTMENT | CUTANEOUS | 0 refills | Status: DC

## 2019-02-22 MED ORDER — LACOSAMIDE 50 MG PO TABS: 50.0000 mg | ORAL_TABLET | Freq: Two times a day (BID) | ORAL | 3 refills | Status: DC

## 2019-02-22 MED ORDER — FLUCONAZOLE 150 MG PO TABS: 150.0000 mg | ORAL_TABLET | Freq: Once | ORAL | 1 refills | Status: AC

## 2019-02-22 MED ORDER — DIPHENOXYLATE-ATROPINE 2.5-0.025 MG PO TABS: 1.0000 | ORAL_TABLET | Freq: Four times a day (QID) | ORAL | 2 refills | Status: DC | PRN

## 2019-02-22 NOTE — Assessment & Plan Note (Signed)
Coumadin renewed

## 2019-02-22 NOTE — Assessment & Plan Note (Signed)
Zpac Diflucan Bactroban

## 2019-02-22 NOTE — Progress Notes (Signed)
Right lower leg today   Subjective:  Patient ID: Tracy Peters, female    DOB: [birth year]  Age: 85 y.o. MRN: [id number]  CC: No chief complaint on file.   HPI Tracy Peters presents for a skin procedure last Wed The next day - more pain, fever 100.6 2 d ago F/u insomnia, seizures  Outpatient Medications Prior to Visit  Medication Sig Dispense Refill  . colesevelam (WELCHOL) 625 MG tablet Take 2 tablets (1,250 mg total) by mouth 2 (two) times daily with a meal. 120 tablet 11  . diphenoxylate-atropine (LOMOTIL) 2.5-0.025 MG tablet Take 1-2 tablets by mouth 4 (four) times daily as needed for diarrhea or loose stools. 120 tablet 2  . enoxaparin (LOVENOX) 100 MG/ML injection Inject 0.9 mLs (90 mg total) into the skin daily. 10 Syringe 1  . EPINEPHrine 0.3 mg/0.3 mL IJ SOAJ injection USE AS DIRECTED 2 Device 1  . ferrous sulfate 325 (65 FE) MG tablet Take 1 tablet (325 mg total) by mouth 3 (three) times daily after meals. (Patient taking differently: Take 325 mg by mouth every other day. )  3  . fluticasone (FLONASE) 50 MCG/ACT nasal spray Place 2 sprays into both nostrils daily. (Patient taking differently: Place 2 sprays into both nostrils daily as needed for allergies or rhinitis. ) 48 g 3  . lacosamide (VIMPAT) 50 MG TABS tablet Take 1 tablet (50 mg total) by mouth 2 (two) times daily. 180 tablet 0  . losartan (COZAAR) 100 MG tablet TAKE 1 TABLET BY MOUTH DAILY. 90 tablet 3  . methylPREDNISolone (MEDROL) 4 MG tablet Take 6 tabs po day 1, 5 tabs po day 2, 4 tabs po day 3, 3 tabs po day 4, 2 tabs po day 5, 1 tab po day 6 then stop 21 tablet 0  . nitrofurantoin, macrocrystal-monohydrate, (MACROBID) 100 MG capsule Take 1 capsule (100 mg total) by mouth 2 (two) times daily. 20 capsule 0  . Probiotic Product (ALIGN PO) Take by mouth. One cap by mouth Bid.    . valACYclovir (VALTREX) 1000 MG tablet Take 1 tablet (1,000 mg total) by mouth 2 (two) times daily. 180 tablet 1  .  warfarin (COUMADIN) 5 MG tablet Take 1 1/2 tablets daily or as directed by anticoagulation clinic. 135 tablet 0  . zolpidem (AMBIEN) 10 MG tablet 1 & 1/2 TABLETS (15MG ) BY MOUTH AT BEDTIME AS NEEDED FOR SLEEP, OK TOREPEAT IN 4HRS 135 tablet 1   No facility-administered medications prior to visit.     ROS: Review of Systems  Constitutional: Negative for activity change, appetite change, chills, fatigue and unexpected weight change.  HENT: Negative for congestion, mouth sores and sinus pressure.   Eyes: Negative for visual disturbance.  Respiratory: Negative for cough and chest tightness.   Gastrointestinal: Negative for abdominal pain and nausea.  Genitourinary: Negative for difficulty urinating, frequency and vaginal pain.  Musculoskeletal: Positive for gait problem. Negative for back pain.  Skin: Positive for color change and wound. Negative for pallor and rash.  Neurological: Negative for dizziness, tremors, weakness, numbness and headaches.  Psychiatric/Behavioral: Negative for confusion and sleep disturbance.    Objective:  BP (!) 148/82 (BP Location: Right Arm, Patient Position: Sitting, Cuff Size: Normal)   Pulse 74   Temp 98.2 F (36.8 C) (Oral)   Ht 5\' 5"  (1.651 m)   Wt 133 lb (60.3 kg)   SpO2 97%   BMI 22.13 kg/m   BP Readings from Last 3 Encounters:  02/22/19 (!) 148/82  01/05/19 (!) 172/87  08/26/18 (!) 156/88    Wt Readings from Last 3 Encounters:  02/22/19 133 lb (60.3 kg)  01/05/19 136 lb 3.2 oz (61.8 kg)  08/26/18 133 lb (60.3 kg)    Physical Exam Constitutional:      General: She is not in acute distress.    Appearance: She is well-developed.  HENT:     Head: Normocephalic.     Right Ear: External ear normal.     Left Ear: External ear normal.     Nose: Nose normal.  Eyes:     General:        Right eye: No discharge.        Left eye: No discharge.     Conjunctiva/sclera: Conjunctivae normal.     Pupils: Pupils are equal, round, and reactive to  light.  Neck:     Musculoskeletal: Normal range of motion and neck supple.     Thyroid: No thyromegaly.     Vascular: No JVD.     Trachea: No tracheal deviation.  Cardiovascular:     Rate and Rhythm: Normal rate and regular rhythm.     Heart sounds: Normal heart sounds.  Pulmonary:     Effort: No respiratory distress.     Breath sounds: No stridor. No wheezing.  Abdominal:     General: Bowel sounds are normal. There is no distension.     Palpations: Abdomen is soft. There is no mass.     Tenderness: There is no abdominal tenderness. There is no guarding or rebound.  Musculoskeletal:        General: Swelling and tenderness present.  Lymphadenopathy:     Cervical: No cervical adenopathy.  Skin:    General: Skin is warm.     Findings: Erythema and lesion present. No rash.  Neurological:     Mental Status: She is oriented to person, place, and time.     Cranial Nerves: No cranial nerve deficit.     Motor: No abnormal muscle tone.     Coordination: Coordination normal.     Deep Tendon Reflexes: Reflexes normal.  Psychiatric:        Behavior: Behavior normal.        Thought Content: Thought content normal.        Judgment: Judgment normal.   RLE w/erythema, wounds Calf - NT  Lab Results  Component Value Date   WBC 7.1 08/05/2018   HGB 12.4 08/05/2018   HCT 37.5 08/05/2018   PLT 290 08/05/2018   GLUCOSE 117 (H) 08/26/2018   CHOL 196 01/03/2016   TRIG 85.0 01/03/2016   HDL 63.10 01/03/2016   LDLDIRECT 129.8 05/31/2012   LDLCALC 116 (H) 01/03/2016   ALT 21 08/05/2018   AST 26 08/05/2018   NA 131 (L) 08/26/2018   K 4.8 08/26/2018   CL 98 08/26/2018   CREATININE 0.93 08/26/2018   BUN 20 08/26/2018   CO2 25 08/26/2018   TSH 2.52 04/20/2018   INR 2.8 02/15/2019    No results found.  Assessment & Plan:   There are no diagnoses linked to this encounter.   No orders of the defined types were placed in this encounter.    Follow-up: No follow-ups on file.  [name], MD

## 2019-02-22 NOTE — Assessment & Plan Note (Signed)
Chronic  Zolpidem 15 mg- pt has to use higher #  x years  Potential benefits of a long term benzodiazepines  use as well as potential risks  and complications were explained to the patient and were aknowledged.

## 2019-02-22 NOTE — Assessment & Plan Note (Signed)
Losartan 

## 2019-03-10 ENCOUNTER — Other Ambulatory Visit (INDEPENDENT_AMBULATORY_CARE_PROVIDER_SITE_OTHER): Payer: Medicare Other

## 2019-03-10 ENCOUNTER — Ambulatory Visit (INDEPENDENT_AMBULATORY_CARE_PROVIDER_SITE_OTHER): Payer: Medicare Other | Admitting: Internal Medicine

## 2019-03-10 ENCOUNTER — Other Ambulatory Visit: Payer: Self-pay

## 2019-03-10 ENCOUNTER — Encounter: Payer: Self-pay | Admitting: Internal Medicine

## 2019-03-10 DIAGNOSIS — L03115 Cellulitis of right lower limb: Secondary | ICD-10-CM

## 2019-03-10 DIAGNOSIS — I742 Embolism and thrombosis of arteries of the upper extremities: Secondary | ICD-10-CM | POA: Diagnosis not present

## 2019-03-10 DIAGNOSIS — M5440 Lumbago with sciatica, unspecified side: Secondary | ICD-10-CM

## 2019-03-10 DIAGNOSIS — G8929 Other chronic pain: Secondary | ICD-10-CM

## 2019-03-10 DIAGNOSIS — G4700 Insomnia, unspecified: Secondary | ICD-10-CM

## 2019-03-10 DIAGNOSIS — R197 Diarrhea, unspecified: Secondary | ICD-10-CM | POA: Diagnosis not present

## 2019-03-10 DIAGNOSIS — I10 Essential (primary) hypertension: Secondary | ICD-10-CM

## 2019-03-10 LAB — CBC WITH DIFFERENTIAL/PLATELET
Basophils Absolute: 0.1 10*3/uL (ref 0.0–0.1)
Basophils Relative: 0.8 % (ref 0.0–3.0)
Eosinophils Absolute: 0.1 10*3/uL (ref 0.0–0.7)
Eosinophils Relative: 1.3 % (ref 0.0–5.0)
HCT: 38.2 % (ref 36.0–46.0)
Hemoglobin: 12.8 g/dL (ref 12.0–15.0)
Lymphocytes Relative: 28.2 % (ref 12.0–46.0)
Lymphs Abs: 2 10*3/uL (ref 0.7–4.0)
MCHC: 33.6 g/dL (ref 30.0–36.0)
MCV: 101.9 fl — ABNORMAL HIGH (ref 78.0–100.0)
Monocytes Absolute: 0.7 10*3/uL (ref 0.1–1.0)
Monocytes Relative: 10.3 % (ref 3.0–12.0)
Neutro Abs: 4.3 10*3/uL (ref 1.4–7.7)
Neutrophils Relative %: 59.4 % (ref 43.0–77.0)
Platelets: 309 10*3/uL (ref 150.0–400.0)
RBC: 3.75 Mil/uL — ABNORMAL LOW (ref 3.87–5.11)
RDW: 14 % (ref 11.5–15.5)
WBC: 7.2 10*3/uL (ref 4.0–10.5)

## 2019-03-10 LAB — BASIC METABOLIC PANEL
BUN: 16 mg/dL (ref 6–23)
CO2: 26 mEq/L (ref 19–32)
Calcium: 9.9 mg/dL (ref 8.4–10.5)
Chloride: 96 mEq/L (ref 96–112)
Creatinine, Ser: 0.88 mg/dL (ref 0.40–1.20)
GFR: 61.29 mL/min (ref >60.00)
Glucose, Bld: 93 mg/dL (ref 70–99)
Potassium: 4.9 mEq/L (ref 3.5–5.1)
Sodium: 129 mEq/L — ABNORMAL LOW (ref 135–145)

## 2019-03-10 LAB — TSH: TSH: 4.53 u[IU]/mL — ABNORMAL HIGH (ref 0.35–4.50)

## 2019-03-10 LAB — HEPATIC FUNCTION PANEL
ALT: 11 U/L (ref 0–35)
AST: 20 U/L (ref 0–37)
Albumin: 4.1 g/dL (ref 3.5–5.2)
Alkaline Phosphatase: 58 U/L (ref 39–117)
Bilirubin, Direct: 0 mg/dL (ref 0.0–0.3)
Total Bilirubin: 0.5 mg/dL (ref 0.2–1.2)
Total Protein: 7.2 g/dL (ref 6.0–8.3)

## 2019-03-10 MED ORDER — HYDROCODONE-ACETAMINOPHEN 5-325 MG PO TABS: 0.5000 | ORAL_TABLET | Freq: Four times a day (QID) | ORAL | 0 refills | Status: DC | PRN

## 2019-03-10 MED ORDER — VALACYCLOVIR HCL 1 G PO TABS: 1000.0000 mg | ORAL_TABLET | Freq: Two times a day (BID) | ORAL | 1 refills | Status: DC

## 2019-03-10 NOTE — Patient Instructions (Signed)
If you have medicare related insurance (such as traditional Medicare, Blue Cross Medicare, [employer] Medicare, or similar), Please make an appointment at the scheduling desk with Jill, the Wellness Health Coach, for your Wellness visit in this office, which is a benefit with your insurance.  

## 2019-03-10 NOTE — Assessment & Plan Note (Signed)
Losartan 

## 2019-03-10 NOTE — Assessment & Plan Note (Addendum)
Tramadol prn - was tolerating ok Norco helped better Tolerated well  Potential benefits of a long term opioids use as well as potential risks (i.e. addiction risk, apnea etc) and complications (i.e. Somnolence, constipation and others) were explained to the patient and were aknowledged.

## 2019-03-10 NOTE — Assessment & Plan Note (Signed)
Zolpidem 15 mg- pt has to use higher #  x years  Potential benefits of a long term benzodiazepines  use as well as potential risks  and complications were explained to the patient and were aknowledged.

## 2019-03-10 NOTE — Assessment & Plan Note (Signed)
Lomotil 

## 2019-03-10 NOTE — Progress Notes (Signed)
Subjective:  Patient ID: Tracy Peters, female    DOB: 01-20-1934  Age: 85 y.o. MRN: [id number]  CC: No chief complaint on file.   HPI Tracy Peters presents for R leg injury - on a Zpac and abx oint. F/u herpes, insomnia, pain  Outpatient Medications Prior to Visit  Medication Sig Dispense Refill  . azithromycin (ZITHROMAX Z-PAK) 250 MG tablet As directed 6 tablet 0  . colesevelam (WELCHOL) 625 MG tablet Take 2 tablets (1,250 mg total) by mouth 2 (two) times daily with a meal. 120 tablet 11  . diphenoxylate-atropine (LOMOTIL) 2.5-0.025 MG tablet Take 1-2 tablets by mouth 4 (four) times daily as needed for diarrhea or loose stools. 120 tablet 2  . enoxaparin (LOVENOX) 100 MG/ML injection Inject 0.9 mLs (90 mg total) into the skin daily. 10 Syringe 1  . EPINEPHrine 0.3 mg/0.3 mL IJ SOAJ injection USE AS DIRECTED 2 Device 1  . ferrous sulfate 325 (65 FE) MG tablet Take 1 tablet (325 mg total) by mouth 3 (three) times daily after meals. (Patient taking differently: Take 325 mg by mouth every other day. )  3  . fluticasone (FLONASE) 50 MCG/ACT nasal spray Place 2 sprays into both nostrils daily. (Patient taking differently: Place 2 sprays into both nostrils daily as needed for allergies or rhinitis. ) 48 g 3  . lacosamide (VIMPAT) 50 MG TABS tablet Take 1 tablet (50 mg total) by mouth 2 (two) times daily. 180 tablet 3  . losartan (COZAAR) 100 MG tablet TAKE 1 TABLET BY MOUTH DAILY. 90 tablet 3  . methylPREDNISolone (MEDROL) 4 MG tablet Take 6 tabs po day 1, 5 tabs po day 2, 4 tabs po day 3, 3 tabs po day 4, 2 tabs po day 5, 1 tab po day 6 then stop 21 tablet 0  . mupirocin ointment (BACTROBAN) 2 % On leg wound w/dressing change qd or bid 30 g 0  . nitrofurantoin, macrocrystal-monohydrate, (MACROBID) 100 MG capsule Take 1 capsule (100 mg total) by mouth 2 (two) times daily. 20 capsule 0  . Probiotic Product (ALIGN PO) Take by mouth. One cap by mouth Bid.    . valACYclovir  (VALTREX) 1000 MG tablet Take 1 tablet (1,000 mg total) by mouth 2 (two) times daily. 180 tablet 1  . warfarin (COUMADIN) 5 MG tablet Take 1 1/2 tablets daily or as directed by anticoagulation clinic. 135 tablet 1  . zolpidem (AMBIEN) 10 MG tablet 1 & 1/2 TABLETS (15MG ) BY MOUTH AT BEDTIME AS NEEDED FOR SLEEP, OK TOREPEAT IN 4HRS 135 tablet 1   No facility-administered medications prior to visit.     ROS: Review of Systems  Constitutional: Negative for activity change, appetite change, chills, fatigue and unexpected weight change.  HENT: Negative for congestion, mouth sores and sinus pressure.   Eyes: Negative for visual disturbance.  Respiratory: Negative for cough and chest tightness.   Gastrointestinal: Negative for abdominal pain and nausea.  Genitourinary: Negative for difficulty urinating, frequency and vaginal pain.  Musculoskeletal: Positive for gait problem. Negative for back pain.  Skin: Positive for wound. Negative for pallor and rash.  Neurological: Negative for dizziness, tremors, weakness, numbness and headaches.  Psychiatric/Behavioral: Negative for confusion, sleep disturbance and suicidal ideas.    Objective:  BP 138/86 (BP Location: Left Arm, Patient Position: Sitting, Cuff Size: Normal)   Pulse 80   Temp 97.8 F (36.6 C) (Oral)   Ht 5\' 5"  (1.651 m)   Wt 134 lb (60.8 kg)  SpO2 97%   BMI 22.30 kg/m   BP Readings from Last 3 Encounters:  03/10/19 138/86  02/22/19 (!) 148/82  01/05/19 (!) 172/87    Wt Readings from Last 3 Encounters:  03/10/19 134 lb (60.8 kg)  02/22/19 133 lb (60.3 kg)  01/05/19 136 lb 3.2 oz (61.8 kg)    Physical Exam Constitutional:      General: She is not in acute distress.    Appearance: She is well-developed.  HENT:     Head: Normocephalic.     Right Ear: External ear normal.     Left Ear: External ear normal.     Nose: Nose normal.  Eyes:     General:        Right eye: No discharge.        Left eye: No discharge.      Conjunctiva/sclera: Conjunctivae normal.     Pupils: Pupils are equal, round, and reactive to light.  Neck:     Musculoskeletal: Normal range of motion and neck supple.     Thyroid: No thyromegaly.     Vascular: No JVD.     Trachea: No tracheal deviation.  Cardiovascular:     Rate and Rhythm: Normal rate and regular rhythm.     Heart sounds: Normal heart sounds.  Pulmonary:     Effort: No respiratory distress.     Breath sounds: No stridor. No wheezing.  Abdominal:     General: Bowel sounds are normal. There is no distension.     Palpations: Abdomen is soft. There is no mass.     Tenderness: There is no abdominal tenderness. There is no guarding or rebound.  Musculoskeletal:        General: No tenderness.  Lymphadenopathy:     Cervical: No cervical adenopathy.  Skin:    Findings: Lesion present. No erythema or rash.  Neurological:     Mental Status: She is oriented to person, place, and time.     Cranial Nerves: No cranial nerve deficit.     Motor: No abnormal muscle tone.     Coordination: Coordination normal.     Deep Tendon Reflexes: Reflexes normal.  Psychiatric:        Behavior: Behavior normal.        Thought Content: Thought content normal.        Judgment: Judgment normal.    Post-bx wounds on R shin - clean  Lab Results  Component Value Date   WBC 7.1 08/05/2018   HGB 12.4 08/05/2018   HCT 37.5 08/05/2018   PLT 290 08/05/2018   GLUCOSE 117 (H) 08/26/2018   CHOL 196 01/03/2016   TRIG 85.0 01/03/2016   HDL 63.10 01/03/2016   LDLDIRECT 129.8 05/31/2012   LDLCALC 116 (H) 01/03/2016   ALT 21 08/05/2018   AST 26 08/05/2018   NA 131 (L) 08/26/2018   K 4.8 08/26/2018   CL 98 08/26/2018   CREATININE 0.93 08/26/2018   BUN 20 08/26/2018   CO2 25 08/26/2018   TSH 2.52 04/20/2018   INR 2.8 02/15/2019    No results found.  Assessment & Plan:   There are no diagnoses linked to this encounter.   No orders of the defined types were placed in this encounter.     Follow-up: No follow-ups on file.  [name], MD

## 2019-03-10 NOTE — Assessment & Plan Note (Signed)
Better  

## 2019-03-16 ENCOUNTER — Other Ambulatory Visit: Payer: Self-pay | Admitting: Internal Medicine

## 2019-03-17 NOTE — Telephone Encounter (Signed)
[place] Controlled Database Checked Last filled: 11/23/18 # 20 LOV w/you: 03/10/19 Next appt w/you: 06/15/19

## 2019-03-29 ENCOUNTER — Ambulatory Visit (INDEPENDENT_AMBULATORY_CARE_PROVIDER_SITE_OTHER): Payer: Medicare Other | Admitting: General Practice

## 2019-03-29 ENCOUNTER — Other Ambulatory Visit: Payer: Self-pay

## 2019-03-29 DIAGNOSIS — Z86718 Personal history of other venous thrombosis and embolism: Secondary | ICD-10-CM

## 2019-03-29 DIAGNOSIS — D6859 Other primary thrombophilia: Secondary | ICD-10-CM | POA: Diagnosis not present

## 2019-03-29 DIAGNOSIS — Z7901 Long term (current) use of anticoagulants: Secondary | ICD-10-CM

## 2019-03-29 DIAGNOSIS — I742 Embolism and thrombosis of arteries of the upper extremities: Secondary | ICD-10-CM

## 2019-03-29 LAB — POCT INR: INR: 3.1 — AB (ref 2.0–3.0)

## 2019-03-29 NOTE — Patient Instructions (Signed)
Pre visit review using our clinic review tool, if applicable. No additional management support is needed unless otherwise documented below in the visit note.  Skip coumadin today (10/13) and then continue to take one-and one-half (1 &1/2) tablets of your 5mg  peach-colored warfarin tablets by mouth every day of the week.  Re-check in 4 weeks.

## 2019-04-04 DIAGNOSIS — I82721 Chronic embolism and thrombosis of deep veins of right upper extremity: Secondary | ICD-10-CM | POA: Diagnosis not present

## 2019-04-04 DIAGNOSIS — D0512 Intraductal carcinoma in situ of left breast: Secondary | ICD-10-CM | POA: Diagnosis not present

## 2019-04-04 DIAGNOSIS — Z7901 Long term (current) use of anticoagulants: Secondary | ICD-10-CM | POA: Diagnosis not present

## 2019-04-07 ENCOUNTER — Other Ambulatory Visit: Payer: Self-pay | Admitting: Internal Medicine

## 2019-04-08 ENCOUNTER — Telehealth: Payer: Self-pay | Admitting: Internal Medicine

## 2019-04-08 NOTE — Telephone Encounter (Signed)
Pt has not taking hydrocodone for  Over 1 month. Pt needs a refill on tramadol for low back pain. [place]

## 2019-04-08 NOTE — Telephone Encounter (Signed)
Please advise 

## 2019-04-10 MED ORDER — TRAMADOL HCL 50 MG PO TABS: 50.0000 mg | ORAL_TABLET | Freq: Two times a day (BID) | ORAL | 1 refills | Status: DC | PRN

## 2019-04-10 NOTE — Telephone Encounter (Signed)
Done. Thanks.

## 2019-04-26 ENCOUNTER — Ambulatory Visit (INDEPENDENT_AMBULATORY_CARE_PROVIDER_SITE_OTHER): Payer: Medicare Other | Admitting: General Practice

## 2019-04-26 ENCOUNTER — Other Ambulatory Visit: Payer: Self-pay

## 2019-04-26 DIAGNOSIS — Z7901 Long term (current) use of anticoagulants: Secondary | ICD-10-CM

## 2019-04-26 DIAGNOSIS — I742 Embolism and thrombosis of arteries of the upper extremities: Secondary | ICD-10-CM

## 2019-04-26 DIAGNOSIS — D6859 Other primary thrombophilia: Secondary | ICD-10-CM

## 2019-04-26 DIAGNOSIS — Z86718 Personal history of other venous thrombosis and embolism: Secondary | ICD-10-CM

## 2019-04-26 LAB — POCT INR: INR: 3 (ref 2.0–3.0)

## 2019-04-26 NOTE — Patient Instructions (Addendum)
Pre visit review using our clinic review tool, if applicable. No additional management support is needed unless otherwise documented below in the visit note.  Continue to take one-and one-half (1 &1/2) tablets of your 5mg  peach-colored warfarin tablets by mouth every day of the week.  Re-check in 4 weeks.

## 2019-05-05 ENCOUNTER — Encounter: Payer: Self-pay | Admitting: Adult Health

## 2019-05-05 ENCOUNTER — Other Ambulatory Visit: Payer: Self-pay

## 2019-05-05 ENCOUNTER — Inpatient Hospital Stay: Payer: Medicare Other | Attending: Oncology | Admitting: Adult Health

## 2019-05-05 VITALS — BP 187/86 | HR 76 | Temp 98.0°F | Resp 18 | Ht 65.0 in | Wt 134.4 lb

## 2019-05-05 DIAGNOSIS — Z90710 Acquired absence of both cervix and uterus: Secondary | ICD-10-CM | POA: Diagnosis not present

## 2019-05-05 DIAGNOSIS — D0512 Intraductal carcinoma in situ of left breast: Secondary | ICD-10-CM

## 2019-05-05 DIAGNOSIS — I742 Embolism and thrombosis of arteries of the upper extremities: Secondary | ICD-10-CM

## 2019-05-05 DIAGNOSIS — Z79899 Other long term (current) drug therapy: Secondary | ICD-10-CM | POA: Diagnosis not present

## 2019-05-05 DIAGNOSIS — Z7901 Long term (current) use of anticoagulants: Secondary | ICD-10-CM | POA: Diagnosis not present

## 2019-05-05 DIAGNOSIS — G4700 Insomnia, unspecified: Secondary | ICD-10-CM | POA: Diagnosis not present

## 2019-05-05 DIAGNOSIS — I10 Essential (primary) hypertension: Secondary | ICD-10-CM | POA: Diagnosis not present

## 2019-05-05 DIAGNOSIS — Z800 Family history of malignant neoplasm of digestive organs: Secondary | ICD-10-CM | POA: Insufficient documentation

## 2019-05-05 DIAGNOSIS — Z803 Family history of malignant neoplasm of breast: Secondary | ICD-10-CM | POA: Insufficient documentation

## 2019-05-05 DIAGNOSIS — Z86718 Personal history of other venous thrombosis and embolism: Secondary | ICD-10-CM | POA: Diagnosis not present

## 2019-05-05 DIAGNOSIS — E785 Hyperlipidemia, unspecified: Secondary | ICD-10-CM | POA: Diagnosis not present

## 2019-05-05 DIAGNOSIS — Z170 Estrogen receptor positive status [ER+]: Secondary | ICD-10-CM | POA: Diagnosis not present

## 2019-05-05 DIAGNOSIS — Z923 Personal history of irradiation: Secondary | ICD-10-CM | POA: Insufficient documentation

## 2019-05-05 NOTE — Progress Notes (Signed)
CLINIC:  Survivorship   REASON FOR VISIT:  Routine follow-up post-treatment for a recent history of breast cancer.  BRIEF ONCOLOGIC HISTORY:  Oncology History  Ductal carcinoma in situ (DCIS) of left breast  07/09/2018 Initial Diagnosis    s/p left breast biopsy 07/09/2018 for ductal carcinoma in situ, grade 1, estrogen and progesterone positive   07/09/2018 Cancer Staging   Staging form: Breast, AJCC 8th Edition - Clinical stage from 07/09/2018: Stage 0 (cTis (DCIS), cN0, cM0, ER+, PR+) - Signed by Gardenia Phlegm, NP on 05/05/2019   08/06/2018 Surgery   status post left lumpectomy 08/06/2018 showing no residual disease   09/06/2018 - 10/01/2018 Radiation Therapy   adjuvant radiation             (a) Left Breast / 42.56 Gy in 16 fractions             (b) Left Breast Seroma Boost / 8 Gy in 4 fractions     INTERVAL HISTORY:  Tracy Peters presents to the [facility] today for our initial meeting to review her survivorship care plan detailing her treatment course for breast cancer, as well as monitoring long-term side effects of that treatment, education regarding health maintenance, screening, and overall wellness and health promotion.     Overall, Tracy Peters reports feeling quite well since completing her radiation therapy.  She has no new concerns today and notes she has remained active.  She was initially fatigued during and following radiation therapy, and that is improving.     REVIEW OF SYSTEMS:  Review of Systems  Constitutional: Positive for fatigue. Negative for appetite change, chills, fever and unexpected weight change.  HENT:   Negative for hearing loss, lump/mass, mouth sores and trouble swallowing.   Eyes: Negative for eye problems and icterus.  Respiratory: Negative for chest tightness, cough and shortness of breath.   Cardiovascular: Negative for chest pain, leg swelling and palpitations.  Gastrointestinal: Negative for abdominal distention, abdominal  pain, constipation, diarrhea, nausea and vomiting.  Endocrine: Negative for hot flashes.  Genitourinary: Negative for difficulty urinating.   Musculoskeletal: Negative for arthralgias.  Skin: Negative for itching and rash.  Neurological: Negative for dizziness, extremity weakness, headaches and numbness.  Hematological: Negative for adenopathy. Does not bruise/bleed easily.  Psychiatric/Behavioral: Negative for depression. The patient is not nervous/anxious.   Breast: Denies any new nodularity, masses, tenderness, nipple changes, or nipple discharge.      ONCOLOGY TREATMENT TEAM:  1. Surgeon:  Dr. Dalbert Batman at [facility] 2. Medical Oncologist: Dr. Jana Hakim  3. Radiation Oncologist: Dr. Lisbeth Renshaw    PAST MEDICAL/SURGICAL HISTORY:  Past Medical History:  Diagnosis Date  . Adenomatous colon polyp   . Allergic rhinitis    uses FLonase nightly  . Anemia   . Arthritis    joint pain  . Blood transfusion   . Bronchitis    hx of-7-43yrs ago  . Carpal tunnel syndrome    numbness and tingling   . Cataracts, bilateral   . Chronic anticoagulation 03/21/2013  . Congenital absence of one kidney    pt only has one kidney  . Diarrhea    takes Immodium daily as needed or Lomotil   . Diverticulosis   . GERD (gastroesophageal reflux disease)    mild  . Herpes    takes Valtrex daily  . History of colon polyps   . History of DVT (deep vein thrombosis) 1998   right arm  . History of staph infection   .  Hyperlipidemia    Medical MD doesn't think its absolutely necessary  . Hypertension    takes Losartan daily  . IBS (irritable bowel syndrome)    takes Electronics engineer daily  . Insomnia    takes Ambien nightly as needed  . LBP (low back pain)   . Long term (current) use of anticoagulants    Dr. Beryle Beams  . Nocturia   . Osteoporosis   . Raynaud's disease   . 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 Winfield Cunas  . Seizures ([place]) 10/20/2017   has not had a seizure since may 2019   . 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 Urinary leakage   . UTI (urinary tract infection)    Past Surgical History:  Procedure Laterality Date  .  kidney disease left     . ABDOMINAL HYSTERECTOMY    . ANTERIOR CERVICAL DECOMP/DISCECTOMY FUSION N/A 01/18/2014   Procedure: ANTERIOR CERVICAL DECOMPRESSION/DISCECTOMY FUSION 3 LEVELS  Cervical  four/five, five/six, six/seven anterior cervical decompression with fusion interbody prosthesis with plating and bonegraft;  Surgeon: Newman Pies, MD;  Location: [facility];  Service: Neurosurgery;  Laterality: N/A;  t  . APPENDECTOMY    . BACK SURGERY     2  . BREAST BIOPSY Left 30+ yrs ago   benign  . BREAST LUMPECTOMY WITH RADIOACTIVE SEED LOCALIZATION Left 08/06/2018   Procedure: LEFT BREAST LUMPECTOMY WITH RADIOACTIVE SEED LOCALIZATION;  Surgeon: Fanny Skates, MD;  Location: [facility];  Service: General;  Laterality: Left;  . BREAST SURGERY     cystectomy-benign  . CARPAL TUNNEL RELEASE Right 06/12/2014   Procedure: CARPAL TUNNEL RELEASE;  Surgeon: Newman Pies, MD;  Location: [place] [facility];  Service: Neurosurgery;  Laterality: Right;  Right Carpal Tunnel Release  . CATARACT EXTRACTION Bilateral 02/15/2015  . CHOLECYSTECTOMY    . COLONOSCOPY    . [place]  . HIP CLOSED REDUCTION Right 08/23/2017   Procedure: CLOSED MANIPULATION HIP;  Surgeon: Nicholes Stairs, MD;  Location: [facility];  Service: Orthopedics;  Laterality: Right;  . I&D of abdomen  1988   couple of wks after gallbladder removed  . KNEE SURGERY     left x 3  . LUMBAR LAMINECTOMY     X 2  . mortons neuromas removed    . OPEN SURGICAL REPAIR OF GLUTEAL TENDON Right 06/15/2017   Procedure: OPEN SURGICAL REPAIR OF GLUTEALmedius TENDON;  Surgeon: Paralee Cancel, MD;  Location: [facility];  Service: Orthopedics;  Laterality: Right;  . SHOULDER SURGERY     Right  . TOTAL HIP  ARTHROPLASTY Right 06/15/2017   Procedure: Right total hip arthroplasty, bursectomy, repair of gluteal tendon, posterior approach;  Surgeon: Paralee Cancel, MD;  Location: [facility];  Service: Orthopedics;  Laterality: Right;  90 mins for all procedures together     ALLERGIES:  Allergies  Allergen Reactions  . Bee Venom Anaphylaxis and Other (See Comments)    Yellow jackets, wasps as well  . Ivp Dye [Iodinated Diagnostic Agents] Anaphylaxis  . Shellfish Allergy Anaphylaxis  . Temazepam Other (See Comments)    Severe Hallucinations and Paranoia  . Amlodipine Besylate Swelling  . Aspirin Other (See Comments)    REACTION: break out in sweats  . Atorvastatin Diarrhea  . Cholestyramine Other (See Comments)    REACTION: mouth irritation  . Codeine Phosphate Nausea And Vomiting    Can take Tramadol ok  .  Demerol [Meperidine] Other (See Comments)    Severe Hallucination!!!!  . Diphenhydramine Hcl Other (See Comments)    hyperactive  . Doxycycline Nausea Only  . Gentamicin Sulfate Other (See Comments)    REACTION: Shortness of breath   . Keppra [Levetiracetam]     Made her too groggy/ sleepy  . Meclizine Hcl Other (See Comments)    REACTION: more dizziness on it  . Meperidine Hcl Other (See Comments)    Unknown  . Metronidazole Other (See Comments)    REACTION: bad taste  . Moxifloxacin Other (See Comments)    REACTION: insomnia Able to use Cipro  . Sulfamethoxazole Other (See Comments)    Kidney problems  . Penicillins Swelling, Rash and Other (See Comments)    Has patient had a PCN reaction causing immediate rash, facial/tongue/throat swelling, SOB or lightheadedness with hypotension: Yes Has patient had a PCN reaction causing severe rash involving mucus membranes or skin necrosis: No Has patient had a PCN reaction that required hospitalization: No Has patient had a PCN reaction occurring within the last 10 years: No If all of the above answers are "NO", then may proceed with  Cephalosporin use.      CURRENT MEDICATIONS:  Outpatient Encounter Medications as of 05/05/2019  Medication Sig Note  . colesevelam (WELCHOL) 625 MG tablet Take 2 tablets (1,250 mg total) by mouth 2 (two) times daily with a meal.   . diphenoxylate-atropine (LOMOTIL) 2.5-0.025 MG tablet Take 1-2 tablets by mouth 4 (four) times daily as needed for diarrhea or loose stools.   . enoxaparin (LOVENOX) 100 MG/ML injection Inject 0.9 mLs (90 mg total) into the skin daily. 08/03/2018: Will start after surgery.  Marland Kitchen EPINEPHrine 0.3 mg/0.3 mL IJ SOAJ injection USE AS DIRECTED   . ferrous sulfate 325 (65 FE) MG tablet Take 1 tablet (325 mg total) by mouth 3 (three) times daily after meals. (Patient taking differently: Take 325 mg by mouth every other day. )   . fluticasone (FLONASE) 50 MCG/ACT nasal spray Place 2 sprays into both nostrils daily. (Patient taking differently: Place 2 sprays into both nostrils daily as needed for allergies or rhinitis. )   . lacosamide (VIMPAT) 50 MG TABS tablet Take 1 tablet (50 mg total) by mouth 2 (two) times daily.   Marland Kitchen losartan (COZAAR) 100 MG tablet TAKE 1 TABLET BY MOUTH DAILY.   . methylPREDNISolone (MEDROL) 4 MG tablet Take 6 tabs po day 1, 5 tabs po day 2, 4 tabs po day 3, 3 tabs po day 4, 2 tabs po day 5, 1 tab po day 6 then stop   . mupirocin ointment (BACTROBAN) 2 % On leg wound w/dressing change qd or bid   . nitrofurantoin, macrocrystal-monohydrate, (MACROBID) 100 MG capsule Take 1 capsule (100 mg total) by mouth 2 (two) times daily.   . Probiotic Product (ALIGN PO) Take by mouth. One cap by mouth Bid.   . traMADol (ULTRAM) 50 MG tablet Take 1-2 tablets (50-100 mg total) by mouth 2 (two) times daily as needed for severe pain.   . valACYclovir (VALTREX) 1000 MG tablet Take 1 tablet (1,000 mg total) by mouth 2 (two) times daily.   Marland Kitchen warfarin (COUMADIN) 5 MG tablet Take 1 1/2 tablets daily or as directed by anticoagulation clinic.   Marland Kitchen zolpidem (AMBIEN) 10 MG tablet 1  & 1/2 TABLETS (15MG ) BY MOUTH AT BEDTIME AS NEEDED FOR SLEEP, OK TOREPEAT IN 4HRS    No facility-administered encounter medications on file as of 05/05/2019.  ONCOLOGIC FAMILY HISTORY:  Family History  Problem Relation Age of Onset  . Breast cancer Sister   . Kidney disease Sister 14       nephritis  . Parkinsonism Mother   . Heart disease Brother   . Osteoarthritis Other   . Ulcerative colitis Sister   . Kidney disease Sister   . Breast cancer Sister   . Colon cancer Neg Hx      GENETIC COUNSELING/TESTING: Done with Dr. Matthew Saras per patient  SOCIAL HISTORY:  Social History   Socioeconomic History  . Marital status: Married    Spouse name: Not on file  . Number of children: 2  . Years of education: Not on file  . Highest education level: Not on file  Occupational History  . Occupation: Retired    Fish farm manager: RETIRED  Social Needs  . Financial resource strain: Not on file  . Food insecurity    Worry: Not on file    Inability: Not on file  . Transportation needs    Medical: No    Non-medical: No  Tobacco Use  . Smoking status: Never Smoker  . Smokeless tobacco: Never Used  Substance and Sexual Activity  . Alcohol use: No    Alcohol/week: 0.0 standard drinks  . Drug use: No  . Sexual activity: Never  Lifestyle  . Physical activity    Days per week: Not on file    Minutes per session: Not on file  . Stress: Not on file  Relationships  . Social Herbalist on phone: Not on file    Gets together: Not on file    Attends religious service: Not on file    Active member of club or organization: Not on file    Attends meetings of clubs or organizations: Not on file    Relationship status: Not on file  . Intimate partner violence    Fear of current or ex partner: Not on file    Emotionally abused: Not on file    Physically abused: Not on file    Forced sexual activity: Not on file  Other Topics Concern  . Not on file  Social History Narrative    Lives home with husband.  Education- graduate level.  Children 2.        PHYSICAL EXAMINATION:  Vital Signs:   Vitals:   05/05/19 1011  BP: (!) 187/86  Pulse: 76  Resp: 18  Temp: 98 F (36.7 C)  SpO2: 100%   Filed Weights   05/05/19 1011  Weight: 134 lb 6.4 oz (61 kg)   General: Well-nourished, well-appearing female in no acute distress.  She is unaccompanied today.   HEENT: Head is normocephalic.  Pupils equal and reactive to light. Conjunctivae clear without exudate.  Sclerae anicteric. Oral mucosa is pink, moist.  Oropharynx is pink without lesions or erythema.  Lymph: No cervical, supraclavicular, or infraclavicular lymphadenopathy noted on palpation.  Cardiovascular: Regular rate and rhythm.Marland Kitchen Respiratory: Clear to auscultation bilaterally. Chest expansion symmetric; breathing non-labored.  GI: Abdomen soft and round; non-tender, non-distended. Bowel sounds normoactive.  GU: Deferred.  Neuro: No focal deficits. Steady gait.  Psych: Mood and affect normal and appropriate for situation.  Extremities: No edema. MSK: No focal spinal tenderness to palpation.  Full range of motion in bilateral upper extremities Skin: Warm and dry.  LABORATORY DATA:  None for this visit.  DIAGNOSTIC IMAGING:  None for this visit.      ASSESSMENT AND PLAN:  Ms.. Peters  is a pleasant 85 y.o. female with Stage 0 left breast DCIS, ER+/PR+, diagnosed in 06/2018, treated with lumpectomy and adjuvant radiation therapy.  She presents to the Survivorship Clinic for our initial meeting and routine follow-up post-completion of treatment for breast cancer.    1. Stage 0 left breast cancer:  Ms. Peters is continuing to recover from definitive treatment for breast cancer. She will follow-up with her medical oncologist, Dr. Lindi Adie  with history and physical exam per surveillance protocol.  Today, a comprehensive survivorship care plan and treatment summary was reviewed with the patient today detailing her  breast cancer diagnosis, treatment course, potential late/long-term effects of treatment, appropriate follow-up care with recommendations for the future, and patient education resources.  A copy of this summary, along with a letter will be sent to the patient's primary care provider via mail/fax/In Basket message after today's visit.    2. Bone health:  Given Tracy Peters's age/history of breast cancer, she is at risk for bone demineralization. She was given education on specific activities to promote bone health.  3. Cancer screening:  Due to Tracy Peters's history and her age, she should receive screening for skin cancers, colon cancer, and gynecologic cancers.  The information and recommendations are listed on the patient's comprehensive care plan/treatment summary and were reviewed in detail with the patient.    4. Health maintenance and wellness promotion: Tracy Peters was encouraged to consume 5-7 servings of fruits and vegetables per day. We reviewed the "Nutrition Rainbow" handout, as well as the handout "Take Control of Your Health and Reduce Your Cancer Risk" from the [place].  She was also encouraged to engage in moderate to vigorous exercise for 30 minutes per day most days of the week. We discussed the [employer] fitness program, which is designed for cancer survivors to help them become more physically fit after cancer treatments.  She was instructed to limit her alcohol consumption and continue to abstain from tobacco use/.     5. Support services/counseling: It is not uncommon for this period of the patient's cancer care trajectory to be one of many emotions and stressors.  We discussed an opportunity for her to participate in the next session of [facility] ("Finding Your New Normal") support group series designed for patients after they have completed treatment.   Tracy Peters was encouraged to take advantage of our many other support services programs, support groups, and/or counseling  in coping with her new life as a cancer survivor after completing anti-cancer treatment.  She was offered support today through active listening and expressive supportive counseling.  She was given information regarding our available services and encouraged to contact me with any questions or for help enrolling in any of our support group/programs.    Dispo:   -Return to cancer center PRN  -Mammogram due in 06/2019 -She is welcome to return back to the Survivorship Clinic at any time; no additional follow-up needed at this time.  -Consider referral back to survivorship as a long-term survivor for continued surveillance  A total of (20) minutes of face-to-face time was spent with this patient with greater than 50% of that time in counseling and care-coordination.   Gardenia Phlegm, NP Survivorship Program [facility] [phone]   Note: PRIMARY CARE PROVIDER Cassandria Anger, [place] [phone]

## 2019-06-01 ENCOUNTER — Telehealth: Payer: Self-pay

## 2019-06-01 ENCOUNTER — Other Ambulatory Visit: Payer: Self-pay | Admitting: Internal Medicine

## 2019-06-01 NOTE — Telephone Encounter (Signed)
Error

## 2019-06-07 ENCOUNTER — Other Ambulatory Visit: Payer: Self-pay | Admitting: Internal Medicine

## 2019-06-07 NOTE — Telephone Encounter (Signed)
Copied from [place] [phone]. Topic: Quick Communication - Rx Refill/Question >> Jun 07, 2019 12:58 PM Rainey Pines A wrote: Medication: zolpidem (AMBIEN) 10 MG tablet (Patient stated pharmacy needs new prescription sent over .)  Has the patient contacted their pharmacy? {Yes (Agent: If no, request that the patient contact the pharmacy for the refill.) (Agent: If yes, when and what did the pharmacy advise?)Contact PCP  Preferred Pharmacy (with phone number or street name): [facility], [place]  Phone:  [phone] Fax:  [phone]     Agent: Please be advised that RX refills may take up to 3 business days. We ask that you follow-up with your pharmacy.

## 2019-06-07 NOTE — Telephone Encounter (Signed)
Requested medication (s) are due for refill today: yes  Requested medication (s) are on the active medication list: yes  Last refill:  02/22/2019  Future visit scheduled:yes  Notes to clinic:  pharmacy needs new script sent over    Requested Prescriptions  Pending Prescriptions Disp Refills   zolpidem (AMBIEN) 10 MG tablet 135 tablet 1    Sig: 1 & 1/2 TABLETS (15MG ) BY MOUTH AT BEDTIME AS NEEDED FOR SLEEP, OK TOREPEAT IN 4HRS      Not Delegated - Psychiatry:  Anxiolytics/Hypnotics Failed - 06/07/2019  1:01 PM      Failed - This refill cannot be delegated      Failed - Urine Drug Screen completed in last 360 days.      Passed - Valid encounter within last 6 months    Recent Outpatient Visits           2 months ago Essential hypertension   [place], Evie Lacks, MD   3 months ago Essential hypertension   [place] Plot[place], Evie Lacks, MD   8 months ago Urinary tract infection without hematuria, site unspecified   [place], Marvis Repress, FNP   9 months ago Seizure [facility])   [place], Evie Lacks, MD   1 year ago Diarrhea, unspecified type   [place], MD       Future Appointments             In 1 week Plotnikov, Evie Lacks, MD [place] [facility]

## 2019-06-14 ENCOUNTER — Ambulatory Visit: Payer: Medicare Other

## 2019-06-15 ENCOUNTER — Encounter: Payer: Self-pay | Admitting: Internal Medicine

## 2019-06-15 ENCOUNTER — Other Ambulatory Visit: Payer: Self-pay

## 2019-06-15 ENCOUNTER — Ambulatory Visit (INDEPENDENT_AMBULATORY_CARE_PROVIDER_SITE_OTHER): Payer: Medicare Other | Admitting: Internal Medicine

## 2019-06-15 DIAGNOSIS — E785 Hyperlipidemia, unspecified: Secondary | ICD-10-CM

## 2019-06-15 DIAGNOSIS — D6859 Other primary thrombophilia: Secondary | ICD-10-CM

## 2019-06-15 DIAGNOSIS — I10 Essential (primary) hypertension: Secondary | ICD-10-CM

## 2019-06-15 DIAGNOSIS — I742 Embolism and thrombosis of arteries of the upper extremities: Secondary | ICD-10-CM

## 2019-06-15 DIAGNOSIS — M5440 Lumbago with sciatica, unspecified side: Secondary | ICD-10-CM

## 2019-06-15 DIAGNOSIS — G8929 Other chronic pain: Secondary | ICD-10-CM

## 2019-06-15 DIAGNOSIS — R531 Weakness: Secondary | ICD-10-CM | POA: Diagnosis not present

## 2019-06-15 DIAGNOSIS — Z7901 Long term (current) use of anticoagulants: Secondary | ICD-10-CM

## 2019-06-15 LAB — BASIC METABOLIC PANEL
BUN: 15 mg/dL (ref 6–23)
CO2: 25 mEq/L (ref 19–32)
Calcium: 9.8 mg/dL (ref 8.4–10.5)
Chloride: 97 mEq/L (ref 96–112)
Creatinine, Ser: 0.81 mg/dL (ref 0.40–1.20)
GFR: 67.39 mL/min (ref >60.00)
Glucose, Bld: 92 mg/dL (ref 70–99)
Potassium: 5.2 mEq/L — ABNORMAL HIGH (ref 3.5–5.1)
Sodium: 129 mEq/L — ABNORMAL LOW (ref 135–145)

## 2019-06-15 LAB — CBC WITH DIFFERENTIAL/PLATELET
Basophils Absolute: 0.1 10*3/uL (ref 0.0–0.1)
Basophils Relative: 0.8 % (ref 0.0–3.0)
Eosinophils Absolute: 0.1 10*3/uL (ref 0.0–0.7)
Eosinophils Relative: 0.7 % (ref 0.0–5.0)
HCT: 37.9 % (ref 36.0–46.0)
Hemoglobin: 12.6 g/dL (ref 12.0–15.0)
Lymphocytes Relative: 22 % (ref 12.0–46.0)
Lymphs Abs: 1.6 10*3/uL (ref 0.7–4.0)
MCHC: 33.2 g/dL (ref 30.0–36.0)
MCV: 103.4 fl — ABNORMAL HIGH (ref 78.0–100.0)
Monocytes Absolute: 0.8 10*3/uL (ref 0.1–1.0)
Monocytes Relative: 10.3 % (ref 3.0–12.0)
Neutro Abs: 4.9 10*3/uL (ref 1.4–7.7)
Neutrophils Relative %: 66.2 % (ref 43.0–77.0)
Platelets: 280 10*3/uL (ref 150.0–400.0)
RBC: 3.66 Mil/uL — ABNORMAL LOW (ref 3.87–5.11)
RDW: 14.4 % (ref 11.5–15.5)
WBC: 7.5 10*3/uL (ref 4.0–10.5)

## 2019-06-15 LAB — HEPATIC FUNCTION PANEL
ALT: 15 U/L (ref 0–35)
AST: 21 U/L (ref 0–37)
Albumin: 4.1 g/dL (ref 3.5–5.2)
Alkaline Phosphatase: 51 U/L (ref 39–117)
Bilirubin, Direct: 0.1 mg/dL (ref 0.0–0.3)
Total Bilirubin: 0.6 mg/dL (ref 0.2–1.2)
Total Protein: 7.2 g/dL (ref 6.0–8.3)

## 2019-06-15 MED ORDER — LACOSAMIDE 50 MG PO TABS: 50.0000 mg | ORAL_TABLET | Freq: Two times a day (BID) | ORAL | 3 refills | Status: DC

## 2019-06-15 MED ORDER — FLUTICASONE PROPIONATE 50 MCG/ACT NA SUSP: 2.0000 | Freq: Every day | NASAL | 3 refills | Status: DC

## 2019-06-15 MED ORDER — LOSARTAN POTASSIUM 100 MG PO TABS: 100.0000 mg | ORAL_TABLET | Freq: Every day | ORAL | 3 refills | Status: DC

## 2019-06-15 MED ORDER — FERROUS SULFATE 325 (65 FE) MG PO TABS: 325.0000 mg | ORAL_TABLET | ORAL | Status: DC

## 2019-06-15 MED ORDER — TRAMADOL HCL 50 MG PO TABS: 50.0000 mg | ORAL_TABLET | Freq: Two times a day (BID) | ORAL | 2 refills | Status: DC | PRN

## 2019-06-15 MED ORDER — WARFARIN SODIUM 5 MG PO TABS: ORAL_TABLET | ORAL | 1 refills | Status: DC

## 2019-06-15 MED ORDER — CARVEDILOL 3.125 MG PO TABS: 3.1250 mg | ORAL_TABLET | Freq: Two times a day (BID) | ORAL | 12 refills | Status: DC

## 2019-06-15 MED ORDER — DIPHENOXYLATE-ATROPINE 2.5-0.025 MG PO TABS: 1.0000 | ORAL_TABLET | Freq: Four times a day (QID) | ORAL | 2 refills | Status: DC | PRN

## 2019-06-15 MED ORDER — VALACYCLOVIR HCL 1 G PO TABS: 1000.0000 mg | ORAL_TABLET | Freq: Two times a day (BID) | ORAL | 1 refills | Status: DC

## 2019-06-15 MED ORDER — ZOLPIDEM TARTRATE 10 MG PO TABS: ORAL_TABLET | ORAL | 1 refills | Status: DC

## 2019-06-15 NOTE — Assessment & Plan Note (Addendum)
BP at home is a little high 175 for SBP One kidney is absent - she was born with 1 kidney Losartan Added Coreg

## 2019-06-15 NOTE — Assessment & Plan Note (Signed)
CFS 

## 2019-06-15 NOTE — Assessment & Plan Note (Signed)
  On diet  

## 2019-06-15 NOTE — Assessment & Plan Note (Signed)
Chronic  Tramadol prn - was tolerating ok. Stopped 3 mo ago due to to some nausea. D/c  Potential benefits of a long term opioids use as well as potential risks (i.e. addiction risk, apnea etc) and complications (i.e. Somnolence, constipation and others) were explained to the patient and were aknowledged.

## 2019-06-15 NOTE — Progress Notes (Signed)
Subjective:  Patient ID: Tracy Peters, female    DOB: 04-07-1934  Age: 85 y.o. MRN: [id number]  CC: No chief complaint on file.   HPI Anglique Vivenzio presents for HTN, seizures, insomnia f/u  Outpatient Medications Prior to Visit  Medication Sig Dispense Refill  . diphenoxylate-atropine (LOMOTIL) 2.5-0.025 MG tablet Take 1-2 tablets by mouth 4 (four) times daily as needed for diarrhea or loose stools. 120 tablet 2  . enoxaparin (LOVENOX) 100 MG/ML injection Inject 0.9 mLs (90 mg total) into the skin daily. 10 Syringe 1  . EPINEPHrine 0.3 mg/0.3 mL IJ SOAJ injection USE AS DIRECTED 2 Device 1  . ferrous sulfate 325 (65 FE) MG tablet Take 1 tablet (325 mg total) by mouth 3 (three) times daily after meals. (Patient taking differently: Take 325 mg by mouth every other day. )  3  . fluticasone (FLONASE) 50 MCG/ACT nasal spray Place 2 sprays into both nostrils daily. (Patient taking differently: Place 2 sprays into both nostrils daily as needed for allergies or rhinitis. ) 48 g 3  . lacosamide (VIMPAT) 50 MG TABS tablet Take 1 tablet (50 mg total) by mouth 2 (two) times daily. 180 tablet 3  . losartan (COZAAR) 100 MG tablet TAKE 1 TABLET BY MOUTH DAILY. 90 tablet 3  . methylPREDNISolone (MEDROL) 4 MG tablet Take 6 tabs po day 1, 5 tabs po day 2, 4 tabs po day 3, 3 tabs po day 4, 2 tabs po day 5, 1 tab po day 6 then stop 21 tablet 0  . mupirocin ointment (BACTROBAN) 2 % On leg wound w/dressing change qd or bid 30 g 0  . nitrofurantoin, macrocrystal-monohydrate, (MACROBID) 100 MG capsule Take 1 capsule (100 mg total) by mouth 2 (two) times daily. 20 capsule 0  . Probiotic Product (ALIGN PO) Take by mouth. One cap by mouth Bid.    . traMADol (ULTRAM) 50 MG tablet Take 1-2 tablets (50-100 mg total) by mouth 2 (two) times daily as needed for severe pain. 120 tablet 1  . valACYclovir (VALTREX) 1000 MG tablet Take 1 tablet (1,000 mg total) by mouth 2 (two) times daily. 180 tablet 1  .  warfarin (COUMADIN) 5 MG tablet Take 1 1/2 tablets daily or as directed by anticoagulation clinic. 135 tablet 1  . zolpidem (AMBIEN) 10 MG tablet 1 & 1/2 TABLETS (15MG ) BY MOUTH AT BEDTIME AS NEEDED FOR SLEEP, OK TOREPEAT IN 4HRS 135 tablet 0  . colesevelam (WELCHOL) 625 MG tablet Take 2 tablets (1,250 mg total) by mouth 2 (two) times daily with a meal. 120 tablet 11   No facility-administered medications prior to visit.    ROS: Review of Systems  Constitutional: Positive for fatigue. Negative for activity change, appetite change, chills and unexpected weight change.  HENT: Negative for congestion, mouth sores and sinus pressure.   Eyes: Negative for visual disturbance.  Respiratory: Negative for cough and chest tightness.   Gastrointestinal: Negative for abdominal pain and nausea.  Genitourinary: Negative for difficulty urinating, frequency and vaginal pain.  Musculoskeletal: Positive for arthralgias, back pain and gait problem.  Skin: Negative for pallor and rash.  Neurological: Positive for weakness. Negative for dizziness, tremors, numbness and headaches.  Psychiatric/Behavioral: Positive for decreased concentration and sleep disturbance. Negative for behavioral problems, confusion and suicidal ideas.    Objective:  BP (!) 180/90 (BP Location: Right Arm, Patient Position: Sitting, Cuff Size: Normal)   Pulse 76   Temp 98 F (36.7 C) (Oral)   Ht 5'  5" (1.651 m)   Wt 130 lb (59 kg)   SpO2 96%   BMI 21.63 kg/m   BP Readings from Last 3 Encounters:  06/15/19 (!) 180/90  05/05/19 (!) 187/86  03/10/19 138/86    Wt Readings from Last 3 Encounters:  06/15/19 130 lb (59 kg)  05/05/19 134 lb 6.4 oz (61 kg)  03/10/19 134 lb (60.8 kg)    Physical Exam Constitutional:      General: She is not in acute distress.    Appearance: Normal appearance. She is well-developed.  HENT:     Head: Normocephalic.     Right Ear: External ear normal.     Left Ear: External ear normal.      Nose: Nose normal.  Eyes:     General:        Right eye: No discharge.        Left eye: No discharge.     Conjunctiva/sclera: Conjunctivae normal.     Pupils: Pupils are equal, round, and reactive to light.  Neck:     Thyroid: No thyromegaly.     Vascular: No JVD.     Trachea: No tracheal deviation.  Cardiovascular:     Rate and Rhythm: Normal rate and regular rhythm.     Heart sounds: Murmur present.  Pulmonary:     Effort: No respiratory distress.     Breath sounds: No stridor. No wheezing.  Abdominal:     General: Bowel sounds are normal. There is no distension.     Palpations: Abdomen is soft. There is no mass.     Tenderness: There is no abdominal tenderness. There is no guarding or rebound.  Musculoskeletal:        General: Tenderness present.     Cervical back: Normal range of motion and neck supple.  Lymphadenopathy:     Cervical: No cervical adenopathy.  Skin:    Findings: No erythema or rash.  Neurological:     Cranial Nerves: No cranial nerve deficit.     Motor: No abnormal muscle tone.     Coordination: Coordination abnormal.     Gait: Gait abnormal.     Deep Tendon Reflexes: Reflexes normal.  Psychiatric:        Behavior: Behavior normal.        Thought Content: Thought content normal.        Judgment: Judgment normal.     Lab Results  Component Value Date   WBC 7.2 03/10/2019   HGB 12.8 03/10/2019   HCT 38.2 03/10/2019   PLT 309.0 03/10/2019   GLUCOSE 93 03/10/2019   CHOL 196 01/03/2016   TRIG 85.0 01/03/2016   HDL 63.10 01/03/2016   LDLDIRECT 129.8 05/31/2012   LDLCALC 116 (H) 01/03/2016   ALT 11 03/10/2019   AST 20 03/10/2019   NA 129 (L) 03/10/2019   K 4.9 03/10/2019   CL 96 03/10/2019   CREATININE 0.88 03/10/2019   BUN 16 03/10/2019   CO2 26 03/10/2019   TSH 4.53 (H) 03/10/2019   INR 3.0 04/26/2019    No results found.  Assessment & Plan:   There are no diagnoses linked to this encounter.   No orders of the defined types were  placed in this encounter.    Follow-up: No follow-ups on file.  [name], MD

## 2019-06-15 NOTE — Assessment & Plan Note (Signed)
On Coumadin 

## 2019-07-04 DIAGNOSIS — M25561 Pain in right knee: Secondary | ICD-10-CM | POA: Diagnosis not present

## 2019-07-04 DIAGNOSIS — M7061 Trochanteric bursitis, right hip: Secondary | ICD-10-CM | POA: Diagnosis not present

## 2019-07-04 DIAGNOSIS — M25562 Pain in left knee: Secondary | ICD-10-CM | POA: Diagnosis not present

## 2019-07-06 DIAGNOSIS — Z6823 Body mass index (BMI) 23.0-23.9, adult: Secondary | ICD-10-CM | POA: Diagnosis not present

## 2019-07-19 ENCOUNTER — Ambulatory Visit (INDEPENDENT_AMBULATORY_CARE_PROVIDER_SITE_OTHER): Payer: Medicare Other | Admitting: General Practice

## 2019-07-19 ENCOUNTER — Other Ambulatory Visit: Payer: Self-pay

## 2019-07-19 DIAGNOSIS — I742 Embolism and thrombosis of arteries of the upper extremities: Secondary | ICD-10-CM

## 2019-07-19 DIAGNOSIS — D6859 Other primary thrombophilia: Secondary | ICD-10-CM | POA: Diagnosis not present

## 2019-07-19 DIAGNOSIS — Z7901 Long term (current) use of anticoagulants: Secondary | ICD-10-CM

## 2019-07-19 DIAGNOSIS — Z86718 Personal history of other venous thrombosis and embolism: Secondary | ICD-10-CM | POA: Diagnosis not present

## 2019-07-19 LAB — POCT INR: INR: 2.6 (ref 2.0–3.0)

## 2019-07-19 NOTE — Patient Instructions (Addendum)
Pre visit review using our clinic review tool, if applicable. No additional management support is needed unless otherwise documented below in the visit note.  Continue to take 1 1/2 tablets of the peach-colored warfarin tablets by mouth every day of the week.  Re-check in 6 weeks.   

## 2019-07-20 ENCOUNTER — Other Ambulatory Visit: Payer: Self-pay | Admitting: Internal Medicine

## 2019-07-20 ENCOUNTER — Ambulatory Visit
Admission: RE | Admit: 2019-07-20 | Discharge: 2019-07-20 | Disposition: A | Discharge status: Home / Self Care | Payer: Medicare Other | Source: Ambulatory Visit | Attending: Oncology | Admitting: Oncology

## 2019-07-20 DIAGNOSIS — R922 Inconclusive mammogram: Secondary | ICD-10-CM | POA: Diagnosis not present

## 2019-07-20 DIAGNOSIS — D0512 Intraductal carcinoma in situ of left breast: Secondary | ICD-10-CM

## 2019-07-20 HISTORY — DX: Personal history of irradiation: Z92.3

## 2019-07-26 DIAGNOSIS — M7741 Metatarsalgia, right foot: Secondary | ICD-10-CM | POA: Diagnosis not present

## 2019-07-26 DIAGNOSIS — M2041 Other hammer toe(s) (acquired), right foot: Secondary | ICD-10-CM | POA: Diagnosis not present

## 2019-07-26 DIAGNOSIS — M79671 Pain in right foot: Secondary | ICD-10-CM | POA: Diagnosis not present

## 2019-07-26 DIAGNOSIS — M2040 Other hammer toe(s) (acquired), unspecified foot: Secondary | ICD-10-CM | POA: Insufficient documentation

## 2019-08-24 DIAGNOSIS — M2041 Other hammer toe(s) (acquired), right foot: Secondary | ICD-10-CM | POA: Diagnosis not present

## 2019-08-24 DIAGNOSIS — M79672 Pain in left foot: Secondary | ICD-10-CM | POA: Diagnosis not present

## 2019-08-30 ENCOUNTER — Other Ambulatory Visit: Payer: Self-pay

## 2019-08-30 ENCOUNTER — Ambulatory Visit (INDEPENDENT_AMBULATORY_CARE_PROVIDER_SITE_OTHER): Payer: Medicare Other | Admitting: General Practice

## 2019-08-30 DIAGNOSIS — Z7901 Long term (current) use of anticoagulants: Secondary | ICD-10-CM

## 2019-08-30 LAB — POCT INR: INR: 2.6 (ref 2.0–3.0)

## 2019-08-30 NOTE — Patient Instructions (Addendum)
Pre visit review using our clinic review tool, if applicable. No additional management support is needed unless otherwise documented below in the visit note.  Continue to take 1 1/2 tablets of the peach-colored warfarin tablets by mouth every day of the week.  Re-check in 6 weeks.   

## 2019-09-12 ENCOUNTER — Other Ambulatory Visit: Payer: Self-pay

## 2019-09-12 ENCOUNTER — Encounter: Payer: Self-pay | Admitting: Internal Medicine

## 2019-09-12 ENCOUNTER — Ambulatory Visit (INDEPENDENT_AMBULATORY_CARE_PROVIDER_SITE_OTHER): Payer: Medicare Other | Admitting: Internal Medicine

## 2019-09-12 DIAGNOSIS — D500 Iron deficiency anemia secondary to blood loss (chronic): Secondary | ICD-10-CM

## 2019-09-12 DIAGNOSIS — I10 Essential (primary) hypertension: Secondary | ICD-10-CM

## 2019-09-12 DIAGNOSIS — Z86718 Personal history of other venous thrombosis and embolism: Secondary | ICD-10-CM | POA: Diagnosis not present

## 2019-09-12 DIAGNOSIS — E871 Hypo-osmolality and hyponatremia: Secondary | ICD-10-CM

## 2019-09-12 DIAGNOSIS — R531 Weakness: Secondary | ICD-10-CM | POA: Diagnosis not present

## 2019-09-12 LAB — BASIC METABOLIC PANEL
BUN: 16 mg/dL (ref 6–23)
CO2: 26 mEq/L (ref 19–32)
Calcium: 9.5 mg/dL (ref 8.4–10.5)
Chloride: 95 mEq/L — ABNORMAL LOW (ref 96–112)
Creatinine, Ser: 0.82 mg/dL (ref 0.40–1.20)
GFR: 66.41 mL/min (ref >60.00)
Glucose, Bld: 83 mg/dL (ref 70–99)
Potassium: 4.5 mEq/L (ref 3.5–5.1)
Sodium: 127 mEq/L — ABNORMAL LOW (ref 135–145)

## 2019-09-12 LAB — CBC WITH DIFFERENTIAL/PLATELET
Basophils Absolute: 0 10*3/uL (ref 0.0–0.1)
Basophils Relative: 0.5 % (ref 0.0–3.0)
Eosinophils Absolute: 0 10*3/uL (ref 0.0–0.7)
Eosinophils Relative: 0.5 % (ref 0.0–5.0)
HCT: 36.3 % (ref 36.0–46.0)
Hemoglobin: 12.4 g/dL (ref 12.0–15.0)
Lymphocytes Relative: 25.8 % (ref 12.0–46.0)
Lymphs Abs: 1.8 10*3/uL (ref 0.7–4.0)
MCHC: 34.1 g/dL (ref 30.0–36.0)
MCV: 104.2 fl — ABNORMAL HIGH (ref 78.0–100.0)
Monocytes Absolute: 0.7 10*3/uL (ref 0.1–1.0)
Monocytes Relative: 9.3 % (ref 3.0–12.0)
Neutro Abs: 4.5 10*3/uL (ref 1.4–7.7)
Neutrophils Relative %: 63.9 % (ref 43.0–77.0)
Platelets: 264 10*3/uL (ref 150.0–400.0)
RBC: 3.49 Mil/uL — ABNORMAL LOW (ref 3.87–5.11)
RDW: 14.5 % (ref 11.5–15.5)
WBC: 7.1 10*3/uL (ref 4.0–10.5)

## 2019-09-12 LAB — HEPATIC FUNCTION PANEL
ALT: 13 U/L (ref 0–35)
AST: 19 U/L (ref 0–37)
Albumin: 4.3 g/dL (ref 3.5–5.2)
Alkaline Phosphatase: 49 U/L (ref 39–117)
Bilirubin, Direct: 0.1 mg/dL (ref 0.0–0.3)
Total Bilirubin: 0.5 mg/dL (ref 0.2–1.2)
Total Protein: 7.2 g/dL (ref 6.0–8.3)

## 2019-09-12 LAB — TSH: TSH: 3.51 u[IU]/mL (ref 0.35–4.50)

## 2019-09-12 MED ORDER — LACOSAMIDE 50 MG PO TABS: 50.0000 mg | ORAL_TABLET | Freq: Two times a day (BID) | ORAL | 3 refills | Status: DC

## 2019-09-12 MED ORDER — DIPHENOXYLATE-ATROPINE 2.5-0.025 MG PO TABS: 1.0000 | ORAL_TABLET | Freq: Four times a day (QID) | ORAL | 2 refills | Status: DC | PRN

## 2019-09-12 MED ORDER — WARFARIN SODIUM 5 MG PO TABS: ORAL_TABLET | ORAL | 1 refills | Status: DC

## 2019-09-12 MED ORDER — ZOLPIDEM TARTRATE 10 MG PO TABS: ORAL_TABLET | ORAL | 1 refills | Status: DC

## 2019-09-12 MED ORDER — LOSARTAN POTASSIUM 100 MG PO TABS: 100.0000 mg | ORAL_TABLET | Freq: Every day | ORAL | 3 refills | Status: DC

## 2019-09-12 MED ORDER — VITAMIN D3 1.25 MG (50000 UT) PO CAPS: 1.0000 | ORAL_CAPSULE | ORAL | 3 refills | Status: DC

## 2019-09-12 NOTE — Assessment & Plan Note (Signed)
Coumadin 

## 2019-09-12 NOTE — Assessment & Plan Note (Signed)
CBC

## 2019-09-12 NOTE — Assessment & Plan Note (Signed)
Overall better 

## 2019-09-12 NOTE — Assessment & Plan Note (Signed)
Losartan 

## 2019-09-12 NOTE — Progress Notes (Signed)
Subjective:  Patient ID: Tracy Peters, female    DOB: Nov 07, 1933  Age: 86 y.o. MRN: [id number]  CC: No chief complaint on file.   HPI Zisel Lovelace presents for HTN, LBP, memory loss  Outpatient Medications Prior to Visit  Medication Sig Dispense Refill  . carvedilol (COREG) 3.125 MG tablet Take 1 tablet (3.125 mg total) by mouth 2 (two) times daily with a meal. 60 tablet 12  . diphenoxylate-atropine (LOMOTIL) 2.5-0.025 MG tablet Take 1-2 tablets by mouth 4 (four) times daily as needed for diarrhea or loose stools. 120 tablet 2  . enoxaparin (LOVENOX) 100 MG/ML injection Inject 0.9 mLs (90 mg total) into the skin daily. 10 Syringe 1  . EPINEPHrine 0.3 mg/0.3 mL IJ SOAJ injection USE AS DIRECTED 2 Device 1  . ferrous sulfate 325 (65 FE) MG tablet Take 1 tablet (325 mg total) by mouth every other day.    . fluticasone (FLONASE) 50 MCG/ACT nasal spray Place 2 sprays into both nostrils daily. 48 g 3  . lacosamide (VIMPAT) 50 MG TABS tablet Take 1 tablet (50 mg total) by mouth 2 (two) times daily. 180 tablet 3  . losartan (COZAAR) 100 MG tablet Take 1 tablet (100 mg total) by mouth daily. 90 tablet 3  . methylPREDNISolone (MEDROL) 4 MG tablet Take 6 tabs po day 1, 5 tabs po day 2, 4 tabs po day 3, 3 tabs po day 4, 2 tabs po day 5, 1 tab po day 6 then stop 21 tablet 0  . mupirocin ointment (BACTROBAN) 2 % On leg wound w/dressing change qd or bid 30 g 0  . nitrofurantoin, macrocrystal-monohydrate, (MACROBID) 100 MG capsule Take 1 capsule (100 mg total) by mouth 2 (two) times daily. 20 capsule 0  . Probiotic Product (ALIGN PO) Take by mouth. One cap by mouth Bid.    . traMADol (ULTRAM) 50 MG tablet Take 1-2 tablets (50-100 mg total) by mouth 2 (two) times daily as needed for severe pain. 120 tablet 2  . valACYclovir (VALTREX) 1000 MG tablet Take 1 tablet (1,000 mg total) by mouth 2 (two) times daily. 180 tablet 1  . warfarin (COUMADIN) 5 MG tablet Take 1 1/2 tablets daily or as  directed by anticoagulation clinic. 135 tablet 1  . zolpidem (AMBIEN) 10 MG tablet 1 & 1/2 TABLETS (15MG ) BY MOUTH AT BEDTIME AS NEEDED FOR SLEEP, OK TOREPEAT IN 4HRS 135 tablet 1  . colesevelam (WELCHOL) 625 MG tablet Take 2 tablets (1,250 mg total) by mouth 2 (two) times daily with a meal. 120 tablet 11   No facility-administered medications prior to visit.    ROS: Review of Systems  Constitutional: Negative for activity change, appetite change, chills, fatigue and unexpected weight change.  HENT: Negative for congestion, mouth sores and sinus pressure.   Eyes: Negative for visual disturbance.  Respiratory: Negative for cough and chest tightness.   Gastrointestinal: Negative for abdominal pain and nausea.  Genitourinary: Negative for difficulty urinating, frequency and vaginal pain.  Musculoskeletal: Positive for arthralgias, back pain and gait problem.  Skin: Negative for pallor and rash.  Neurological: Negative for dizziness, tremors, weakness, light-headedness, numbness and headaches.  Psychiatric/Behavioral: Positive for sleep disturbance. Negative for confusion, dysphoric mood and suicidal ideas. The patient is not nervous/anxious.     Objective:  BP (!) 166/88 (BP Location: Left Arm, Patient Position: Sitting, Cuff Size: Normal)   Pulse 71   Temp 98 F (36.7 C) (Oral)   Ht 5\' 5"  (1.651 m)  Wt 127 lb (57.6 kg)   SpO2 98%   BMI 21.13 kg/m   BP Readings from Last 3 Encounters:  09/12/19 (!) 166/88  06/15/19 (!) 180/90  05/05/19 (!) 187/86    Wt Readings from Last 3 Encounters:  09/12/19 127 lb (57.6 kg)  06/15/19 130 lb (59 kg)  05/05/19 134 lb 6.4 oz (61 kg)    Physical Exam Constitutional:      General: She is not in acute distress.    Appearance: She is well-developed.  HENT:     Head: Normocephalic.     Right Ear: External ear normal.     Left Ear: External ear normal.     Nose: Nose normal.  Eyes:     General:        Right eye: No discharge.         Left eye: No discharge.     Conjunctiva/sclera: Conjunctivae normal.     Pupils: Pupils are equal, round, and reactive to light.  Neck:     Thyroid: No thyromegaly.     Vascular: No JVD.     Trachea: No tracheal deviation.  Cardiovascular:     Rate and Rhythm: Normal rate and regular rhythm.     Heart sounds: Normal heart sounds.  Pulmonary:     Effort: No respiratory distress.     Breath sounds: No stridor. No wheezing.  Abdominal:     General: Bowel sounds are normal. There is no distension.     Palpations: Abdomen is soft. There is no mass.     Tenderness: There is no abdominal tenderness. There is no guarding or rebound.  Musculoskeletal:        General: Tenderness present.     Cervical back: Normal range of motion and neck supple.  Lymphadenopathy:     Cervical: No cervical adenopathy.  Skin:    Findings: No erythema or rash.  Neurological:     Mental Status: She is oriented to person, place, and time.     Cranial Nerves: No cranial nerve deficit.     Motor: No abnormal muscle tone.     Coordination: Coordination abnormal.     Deep Tendon Reflexes: Reflexes normal.  Psychiatric:        Behavior: Behavior normal.        Thought Content: Thought content normal.        Judgment: Judgment normal.   LS tender Slow wide base gait  Lab Results  Component Value Date   WBC 7.5 06/15/2019   HGB 12.6 06/15/2019   HCT 37.9 06/15/2019   PLT 280.0 06/15/2019   GLUCOSE 92 06/15/2019   CHOL 196 01/03/2016   TRIG 85.0 01/03/2016   HDL 63.10 01/03/2016   LDLDIRECT 129.8 05/31/2012   LDLCALC 116 (H) 01/03/2016   ALT 15 06/15/2019   AST 21 06/15/2019   NA 129 (L) 06/15/2019   K 5.2 No hemolysis seen (H) 06/15/2019   CL 97 06/15/2019   CREATININE 0.81 06/15/2019   BUN 15 06/15/2019   CO2 25 06/15/2019   TSH 4.53 (H) 03/10/2019   INR 2.6 08/30/2019    MM DIAG BREAST TOMO BILATERAL  Result Date: 07/20/2019 CLINICAL DATA:  86 year old patient presents for annual mammogram.  Left breast lumpectomy in February 2020 for low nuclear grade ductal carcinoma in situ. EXAM: DIGITAL DIAGNOSTIC BILATERAL MAMMOGRAM WITH CAD AND TOMO COMPARISON:  Previous exam(s). ACR Breast Density Category c: The breast tissue is heterogeneously dense, which may obscure small masses. FINDINGS: There  are lumpectomy changes in the medial left breast. Mild skin thickening of the left breast compatible with radiation therapy. No mass, nonsurgical distortion, or suspicious microcalcification is identified in either breast to suggest malignancy. Bilateral atherosclerotic vascular calcifications are noted. Mammographic images were processed with CAD. IMPRESSION: No evidence of malignancy in either breast. Lumpectomy changes on the left. RECOMMENDATION: Diagnostic mammogram is suggested in 1 year. (Code:DM-B-01Y) I have discussed the findings and recommendations with the patient. If applicable, a reminder letter will be sent to the patient regarding the next appointment. BI-RADS CATEGORY  2: Benign. Electronically Signed   By: Curlene Dolphin M.D.   On: 07/20/2019 16:13    Assessment & Plan:   There are no diagnoses linked to this encounter.   No orders of the defined types were placed in this encounter.    Follow-up: No follow-ups on file.  [name], MD

## 2019-09-12 NOTE — Addendum Note (Signed)
Addended by: Isaiah Serge D on: 09/12/2019 03:24 PM   Modules accepted: Orders

## 2019-09-12 NOTE — Assessment & Plan Note (Signed)
BMET 

## 2019-09-13 LAB — IRON,TIBC AND FERRITIN PANEL
%SAT: 38 % (calc) (ref 16–45)
Ferritin: 134 ng/mL (ref 16–288)
Iron: 118 ug/dL (ref 45–160)
TIBC: 313 mcg/dL (calc) (ref 250–450)

## 2019-09-15 ENCOUNTER — Telehealth: Payer: Self-pay | Admitting: Internal Medicine

## 2019-09-15 NOTE — Telephone Encounter (Signed)
New message:   Pt's husband is calling and states if we can call [employer] at [phone] and give a ref # [id number] in regards to an appeal for zolpidem (AMBIEN) 10 MG tablet. Please advise.

## 2019-10-07 NOTE — Telephone Encounter (Signed)
Appeal faxed

## 2019-10-10 NOTE — Telephone Encounter (Signed)
    [employer] calling with approval for zolpidem (AMBIEN) 10 MG tablet 135 TABLETS / 30 day supply  approval # [id number] DATES: 09/09/19 to  06/15/20  Phone [phone]

## 2019-10-11 ENCOUNTER — Other Ambulatory Visit: Payer: Self-pay

## 2019-10-11 ENCOUNTER — Ambulatory Visit (INDEPENDENT_AMBULATORY_CARE_PROVIDER_SITE_OTHER): Payer: Medicare Other | Admitting: General Practice

## 2019-10-11 DIAGNOSIS — Z7901 Long term (current) use of anticoagulants: Secondary | ICD-10-CM

## 2019-10-11 DIAGNOSIS — I742 Embolism and thrombosis of arteries of the upper extremities: Secondary | ICD-10-CM

## 2019-10-11 DIAGNOSIS — Z86718 Personal history of other venous thrombosis and embolism: Secondary | ICD-10-CM

## 2019-10-11 DIAGNOSIS — D6859 Other primary thrombophilia: Secondary | ICD-10-CM

## 2019-10-11 LAB — POCT INR: INR: 3 (ref 2.0–3.0)

## 2019-10-11 NOTE — Patient Instructions (Addendum)
Pre visit review using our clinic review tool, if applicable. No additional management support is needed unless otherwise documented below in the visit note.  Continue to take 1 1/2 tablets of the peach-colored warfarin tablets by mouth every day of the week.  Re-check in 6 weeks.   

## 2019-10-12 DIAGNOSIS — D0512 Intraductal carcinoma in situ of left breast: Secondary | ICD-10-CM | POA: Diagnosis not present

## 2019-11-12 ENCOUNTER — Other Ambulatory Visit: Payer: Self-pay | Admitting: Family

## 2019-11-12 ENCOUNTER — Telehealth: Payer: Medicare Other | Admitting: Nurse Practitioner

## 2019-11-12 DIAGNOSIS — R399 Unspecified symptoms and signs involving the genitourinary system: Secondary | ICD-10-CM | POA: Diagnosis not present

## 2019-11-12 MED ORDER — CEPHALEXIN 500 MG PO CAPS: 500.0000 mg | ORAL_CAPSULE | Freq: Two times a day (BID) | ORAL | 0 refills | Status: DC

## 2019-11-12 MED ORDER — CIPROFLOXACIN HCL 500 MG PO TABS
500.0000 mg | ORAL_TABLET | Freq: Two times a day (BID) | ORAL | 0 refills | Status: DC
Start: 2019-11-12 — End: 2019-11-18

## 2019-11-12 NOTE — Progress Notes (Signed)
We are sorry that you are not feeling well.  Here is how we plan to help! I attempted to contact patient by phone to discuss allergies , but there wa no answer. Voice mail was left. Based on what you shared with me it looks like you most likely have a simple urinary tract infection.  A UTI (Urinary Tract Infection) is a bacterial infection of the bladder.  Most cases of urinary tract infections are simple to treat but a key part of your care is to encourage you to drink plenty of fluids and watch your symptoms carefully.  I have prescribed Keflex 500mg  1 po BID 14 days.  Your symptoms should gradually improve. Call us if the burning in your urine worsens, you develop worsening fever, back pain or pelvic pain or if your symptoms do not resolve after completing the antibiotic.  Urinary tract infections can be prevented by drinking plenty of water to keep your body hydrated.  Also be sure when you wipe, wipe from front to back and don't hold it in!  If possible, empty your bladder every 4 hours.  Your e-visit answers were reviewed by a board certified advanced clinical practitioner to complete your personal care plan.  Depending on the condition, your plan could have included both over the counter or prescription medications.  If there is a problem please reply  once you have received a response from your provider.  Your safety is important to [name].  If you have drug allergies check your prescription carefully.    You can use MyChart to ask questions about today's visit, request a non-urgent call back, or ask for a work or school excuse for 24 hours related to this e-Visit. If it has been greater than 24 hours you will need to follow up with your provider, or enter a new e-Visit to address those concerns.   You will get an e-mail in the next two days asking about your experience.  I hope that your e-visit has been valuable and will speed your recovery. Thank you for using e-visits.   5-10 minutes  spent reviewing and documenting in chart.

## 2019-11-12 NOTE — Addendum Note (Signed)
Addended by: Chevis Pretty on: 11/12/2019 11:43 AM   Modules accepted: Orders

## 2019-11-17 ENCOUNTER — Other Ambulatory Visit: Payer: Self-pay | Admitting: Nurse Practitioner

## 2019-11-18 ENCOUNTER — Other Ambulatory Visit: Payer: Self-pay | Admitting: Internal Medicine

## 2019-11-18 MED ORDER — CIPROFLOXACIN HCL 250 MG PO TABS
250.0000 mg | ORAL_TABLET | Freq: Two times a day (BID) | ORAL | 0 refills | Status: DC
Start: 2019-11-18 — End: 2020-01-04

## 2019-11-22 ENCOUNTER — Other Ambulatory Visit: Payer: Self-pay

## 2019-11-22 ENCOUNTER — Ambulatory Visit (INDEPENDENT_AMBULATORY_CARE_PROVIDER_SITE_OTHER): Payer: Medicare Other | Admitting: General Practice

## 2019-11-22 DIAGNOSIS — I742 Embolism and thrombosis of arteries of the upper extremities: Secondary | ICD-10-CM | POA: Diagnosis not present

## 2019-11-22 DIAGNOSIS — Z86718 Personal history of other venous thrombosis and embolism: Secondary | ICD-10-CM | POA: Diagnosis not present

## 2019-11-22 DIAGNOSIS — D6859 Other primary thrombophilia: Secondary | ICD-10-CM | POA: Diagnosis not present

## 2019-11-22 DIAGNOSIS — Z7901 Long term (current) use of anticoagulants: Secondary | ICD-10-CM

## 2019-11-22 LAB — POCT INR: INR: 2.4 (ref 2.0–3.0)

## 2019-11-22 NOTE — Patient Instructions (Signed)
Pre visit review using our clinic review tool, if applicable. No additional management support is needed unless otherwise documented below in the visit note.  Continue to take 1 1/2 tablets of the peach-colored warfarin tablets by mouth every day of the week.  Re-check in 6 weeks.   

## 2019-11-28 NOTE — Telephone Encounter (Signed)
    1.Medication Requested:zolpidem (AMBIEN) 10 MG tablet  2. Pharmacy (Name, [place], City):[facility]  3. On Med List: yes  4. Last Visit with PCP:  09/12/19  5. Next visit date with PCP: 03/07/20   Agent: Please be advised that RX refills may take up to 3 business days. We ask that you follow-up with your pharmacy.

## 2019-11-30 ENCOUNTER — Other Ambulatory Visit: Payer: Self-pay | Admitting: Internal Medicine

## 2019-11-30 MED ORDER — ZOLPIDEM TARTRATE 10 MG PO TABS: ORAL_TABLET | ORAL | 1 refills | Status: DC

## 2019-12-14 NOTE — Progress Notes (Signed)
Medical screening examination/treatment/procedure(s) were performed by non-physician practitioner and as supervising physician I was immediately available for consultation/collaboration. I agree with above. Zakk Borgen, MD  

## 2020-01-04 ENCOUNTER — Encounter (HOSPITAL_COMMUNITY): Payer: Self-pay | Admitting: Emergency Medicine

## 2020-01-04 ENCOUNTER — Emergency Department (HOSPITAL_COMMUNITY)
Admission: EM | Admit: 2020-01-04 | Discharge: 2020-01-04 | Disposition: A | Discharge status: Home / Self Care | Payer: Medicare Other | Attending: Emergency Medicine | Admitting: Emergency Medicine

## 2020-01-04 ENCOUNTER — Emergency Department (HOSPITAL_COMMUNITY): Payer: Medicare Other

## 2020-01-04 DIAGNOSIS — R0989 Other specified symptoms and signs involving the circulatory and respiratory systems: Secondary | ICD-10-CM | POA: Insufficient documentation

## 2020-01-04 DIAGNOSIS — R509 Fever, unspecified: Secondary | ICD-10-CM | POA: Diagnosis not present

## 2020-01-04 DIAGNOSIS — I10 Essential (primary) hypertension: Secondary | ICD-10-CM | POA: Insufficient documentation

## 2020-01-04 DIAGNOSIS — R05 Cough: Secondary | ICD-10-CM | POA: Insufficient documentation

## 2020-01-04 DIAGNOSIS — J209 Acute bronchitis, unspecified: Secondary | ICD-10-CM | POA: Diagnosis not present

## 2020-01-04 DIAGNOSIS — Z96641 Presence of right artificial hip joint: Secondary | ICD-10-CM | POA: Insufficient documentation

## 2020-01-04 DIAGNOSIS — R0602 Shortness of breath: Secondary | ICD-10-CM | POA: Diagnosis not present

## 2020-01-04 DIAGNOSIS — R0789 Other chest pain: Secondary | ICD-10-CM | POA: Diagnosis not present

## 2020-01-04 DIAGNOSIS — Z20822 Contact with and (suspected) exposure to covid-19: Secondary | ICD-10-CM | POA: Diagnosis not present

## 2020-01-04 DIAGNOSIS — Z79899 Other long term (current) drug therapy: Secondary | ICD-10-CM | POA: Insufficient documentation

## 2020-01-04 DIAGNOSIS — J439 Emphysema, unspecified: Secondary | ICD-10-CM | POA: Diagnosis not present

## 2020-01-04 LAB — CBC
HCT: 35.9 % — ABNORMAL LOW (ref 36.0–46.0)
Hemoglobin: 12.1 g/dL (ref 12.0–15.0)
MCH: 34.1 pg — ABNORMAL HIGH (ref 26.0–34.0)
MCHC: 33.7 g/dL (ref 30.0–36.0)
MCV: 101.1 fL — ABNORMAL HIGH (ref 80.0–100.0)
Platelets: 187 10*3/uL (ref 150–400)
RBC: 3.55 MIL/uL — ABNORMAL LOW (ref 3.87–5.11)
RDW: 13 % (ref 11.5–15.5)
WBC: 6.9 10*3/uL (ref 4.0–10.5)
nRBC: 0 % (ref 0.0–0.2)

## 2020-01-04 LAB — BASIC METABOLIC PANEL
Anion gap: 11 (ref 5–15)
BUN: 12 mg/dL (ref 8–23)
CO2: 25 mmol/L (ref 22–32)
Calcium: 8.7 mg/dL — ABNORMAL LOW (ref 8.9–10.3)
Chloride: 93 mmol/L — ABNORMAL LOW (ref 98–111)
Creatinine, Ser: 1.02 mg/dL — ABNORMAL HIGH (ref 0.44–1.00)
GFR calc Af Amer: 58 mL/min — ABNORMAL LOW (ref >60)
GFR calc non Af Amer: 50 mL/min — ABNORMAL LOW (ref >60)
Glucose, Bld: 99 mg/dL (ref 70–99)
Potassium: 3.9 mmol/L (ref 3.5–5.1)
Sodium: 129 mmol/L — ABNORMAL LOW (ref 135–145)

## 2020-01-04 LAB — SARS CORONAVIRUS 2 BY RT PCR (HOSPITAL ORDER, PERFORMED IN ~~LOC~~ HOSPITAL LAB): SARS Coronavirus 2: NEGATIVE

## 2020-01-04 LAB — TROPONIN I (HIGH SENSITIVITY): Troponin I (High Sensitivity): 27 ng/L — ABNORMAL HIGH (ref <18)

## 2020-01-04 MED ORDER — PREDNISONE 10 MG PO TABS
20.0000 mg | ORAL_TABLET | Freq: Two times a day (BID) | ORAL | 0 refills | Status: DC
Start: 2020-01-04 — End: 2020-01-16

## 2020-01-04 MED ORDER — AZITHROMYCIN 250 MG PO TABS
250.0000 mg | ORAL_TABLET | Freq: Every day | ORAL | 0 refills | Status: DC
Start: 2020-01-04 — End: 2020-08-16

## 2020-01-04 MED ORDER — PREDNISONE 20 MG PO TABS
20.0000 mg | ORAL_TABLET | Freq: Once | ORAL | Status: AC
  Administered 2020-01-04: 20 mg via ORAL
  Filled 2020-01-04: qty 1

## 2020-01-04 MED ORDER — ALBUTEROL SULFATE HFA 108 (90 BASE) MCG/ACT IN AERS
2.0000 | INHALATION_SPRAY | Freq: Once | RESPIRATORY_TRACT | Status: AC
  Administered 2020-01-04: 2 via RESPIRATORY_TRACT
  Filled 2020-01-04: qty 6.7

## 2020-01-04 MED ORDER — AZITHROMYCIN 250 MG PO TABS
500.0000 mg | ORAL_TABLET | Freq: Once | ORAL | Status: AC
  Administered 2020-01-04: 500 mg via ORAL
  Filled 2020-01-04: qty 2

## 2020-01-04 MED ORDER — ACETAMINOPHEN 325 MG PO TABS
650.0000 mg | ORAL_TABLET | Freq: Once | ORAL | Status: AC | PRN
  Administered 2020-01-04: 650 mg via ORAL
  Filled 2020-01-04: qty 2

## 2020-01-04 MED ORDER — BENZONATATE 100 MG PO CAPS
100.0000 mg | ORAL_CAPSULE | Freq: Three times a day (TID) | ORAL | 0 refills | Status: DC
Start: 2020-01-04 — End: 2020-01-16

## 2020-01-04 NOTE — ED Triage Notes (Signed)
Per pt, states productive,cough, chest congestion, chest pain since Sunday-states she cant stop coughing-PCP told her to come to ED

## 2020-01-04 NOTE — Discharge Instructions (Addendum)
Begin taking Zithromax and prednisone as prescribed.  Use the inhaler, 2 puffs every 4 hours as needed for difficulty breathing or persistent cough.  Tessalon as prescribed as needed for cough.  Return to the emergency department if you develop severe chest pain, worsening breathing, or other new and concerning symptoms.

## 2020-01-04 NOTE — ED Provider Notes (Signed)
[facility] DEPT Provider Note   CSN: [id number] Arrival date & time: 01/04/20  3235     History Chief Complaint  Patient presents with  . Cough  . Chest Pain    Tracy Peters is a 86 y.o. female.  Patient is an 86 year old female with past medical history of anemia, GERD, irritable bowel.  She presents today for a several day history of persistent cough and fever.  Patient describes cough productive of clear mucus.  She also describes some tightness in her chest with coughing.  She does feel somewhat short of breath.  She denies any ill contacts or exposures to COVID-19.  Patient did receive her vaccine early in 2021.  The history is provided by the patient.  Cough Cough characteristics:  Productive Sputum characteristics:  White Severity:  Moderate Onset quality:  Gradual Duration:  3 days Timing:  Constant Progression:  Worsening Chronicity:  New Relieved by:  Nothing Worsened by:  Nothing      Past Medical History:  Diagnosis Date  . Adenomatous colon polyp   . Allergic rhinitis    uses FLonase nightly  . Anemia   . Arthritis    joint pain  . Blood transfusion   . Bronchitis    hx of-7-58yr ago  . Carpal tunnel syndrome    numbness and tingling   . Cataracts, bilateral   . Chronic anticoagulation 03/21/2013  . Congenital absence of one kidney    pt only has one kidney  . Diarrhea    takes Immodium daily as needed or Lomotil   . Diverticulosis   . GERD (gastroesophageal reflux disease)    mild  . Herpes    takes Valtrex daily  . History of colon polyps   . History of DVT (deep vein thrombosis) 1998   right arm  . History of staph infection   . Hyperlipidemia    Medical MD doesn't think its absolutely necessary  . Hypertension    takes Losartan daily  . IBS (irritable bowel syndrome)    takes AElectronics engineerdaily  . Insomnia    takes Ambien nightly as needed  . LBP (low back pain)   . Long term (current) use of  anticoagulants    Dr. GBeryle Beams . Nocturia   . Osteoporosis   . Personal history of radiation therapy   . Raynaud's disease   . Sarcoma of left lower extremity ([place]    Dr. AWinfield Cunas . Seizures ([facility] 10/20/2017   has not had a seizure since may 2019  . 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 Urinary leakage   . UTI (urinary tract infection)     Patient Active Problem List   Diagnosis Date Noted  . Cellulitis 02/22/2019  . Hearing loss 08/26/2018  . Ductal carcinoma in situ (DCIS) of left breast 08/03/2018  . LFT elevation 12/22/2017  . Weakness 12/16/2017  . Hyponatremia 12/16/2017  . Seizure ([place] 10/20/2017  . Dislocation of internal right hip prosthesis ([place] 08/23/2017  . Anemia, iron deficiency 07/29/2017  . S/P right THA, PA 06/15/2017  . Right bursectomy 06/01/2017  . Preop exam for internal medicine 05/20/2017  . Cerumen impaction 01/27/2017  . Actinic keratosis 01/27/2017  . Primary hypercoagulable state ([place] [D68.59] 11/17/2016  . Long term (current) use of anticoagulants [Z79.01] 11/17/2016  . Osteopenia 07/14/2016  . Right-sided chest wall pain 04/17/2016  . 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 Dysuria 06/15/2015  . Cervical spondylosis with myelopathy and radiculopathy 01/18/2014  . Cervical radiculitis 12/28/2013  . Left knee pain 12/28/2013  . Paronychia 12/28/2013  . Chronic anticoagulation 03/21/2013  . Well adult exam 06/02/2012  . 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[place]) 09/16/2011  . Urticaria 05/28/2011  . DIVERTICULOSIS, COLON 04/30/2010  . COLONIC POLYPS, ADENOMATOUS, HX OF 04/30/2010  . Dyslipidemia 09/20/2009  . VERTIGO 09/20/2009  . ALLERGIC RHINITIS 03/27/2009  . INSOMNIA, PERSISTENT 09/20/2008  . PARESTHESIA 09/20/2008  . RASH AND OTHER NONSPECIFIC SKIN ERUPTION 03/22/2008  . ACUTE BRONCHITIS 02/22/2008  . Nonspecific (abnormal) findings on radiological and  other examination of body structure 02/22/2008  . CHEST XRAY, ABNORMAL 02/22/2008  . GERD 09/21/2007  . Essential hypertension 03/23/2007  . LOW BACK PAIN 03/23/2007  . DVT, HX OF 03/23/2007  . Diarrhea 03/18/2007    Past Surgical History:  Procedure Laterality Date  .  kidney disease left     . ABDOMINAL HYSTERECTOMY    . ANTERIOR CERVICAL DECOMP/DISCECTOMY FUSION N/A 01/18/2014   Procedure: ANTERIOR CERVICAL DECOMPRESSION/DISCECTOMY FUSION 3 LEVELS  Cervical  four/five, five/six, six/seven anterior cervical decompression with fusion interbody prosthesis with plating and bonegraft;  Surgeon: Newman Pies, MD;  Location: [facility];  Service: Neurosurgery;  Laterality: N/A;  t  . APPENDECTOMY    . BACK SURGERY     2  . BREAST BIOPSY Left 30+ yrs ago   benign  . BREAST BIOPSY Left 07/09/2018  . BREAST LUMPECTOMY Left 08/06/2018  . BREAST LUMPECTOMY WITH RADIOACTIVE SEED LOCALIZATION Left 08/06/2018   Procedure: LEFT BREAST LUMPECTOMY WITH RADIOACTIVE SEED LOCALIZATION;  Surgeon: Fanny Skates, MD;  Location: [facility];  Service: General;  Laterality: Left;  . BREAST SURGERY     cystectomy-benign  . CARPAL TUNNEL RELEASE Right 06/12/2014   Procedure: CARPAL TUNNEL RELEASE;  Surgeon: Newman Pies, MD;  Location: [facility];  Service: Neurosurgery;  Laterality: Right;  Right Carpal Tunnel Release  . CATARACT EXTRACTION Bilateral 02/15/2015  . CHOLECYSTECTOMY    . COLONOSCOPY    . [place]  . HIP CLOSED REDUCTION Right 08/23/2017   Procedure: CLOSED MANIPULATION HIP;  Surgeon: Nicholes Stairs, MD;  Location: [facility];  Service: Orthopedics;  Laterality: Right;  . I&D of abdomen  1988   couple of wks after gallbladder removed  . KNEE SURGERY     left x 3  . LUMBAR LAMINECTOMY     X 2  . mortons neuromas removed    . OPEN SURGICAL REPAIR OF GLUTEAL TENDON Right 06/15/2017   Procedure: OPEN SURGICAL REPAIR OF GLUTEALmedius TENDON;   Surgeon: Paralee Cancel, MD;  Location: [facility];  Service: Orthopedics;  Laterality: Right;  . SHOULDER SURGERY     Right  . TOTAL HIP ARTHROPLASTY Right 06/15/2017   Procedure: Right total hip arthroplasty, bursectomy, repair of gluteal tendon, posterior approach;  Surgeon: Paralee Cancel, MD;  Location: [facility];  Service: Orthopedics;  Laterality: Right;  90 mins for all procedures together     OB History   No obstetric history on file.     Family History  Problem Relation Age of Onset  . Breast cancer Sister   . Kidney disease Sister 70       nephritis  . Parkinsonism Mother   . Heart disease Brother   . Osteoarthritis Other   . Ulcerative colitis Sister   . Kidney disease Sister   . Breast cancer  Sister   . Colon cancer Neg Hx     Social History   Tobacco Use  . Smoking status: Never Smoker  . Smokeless tobacco: Never Used  Vaping Use  . Vaping Use: Never used  Substance Use Topics  . Alcohol use: No    Alcohol/week: 0.0 standard drinks  . Drug use: No    Home Medications Prior to Admission medications   Medication Sig Start Date End Date Taking? Authorizing Provider  carvedilol (COREG) 3.125 MG tablet Take 1 tablet (3.125 mg total) by mouth 2 (two) times daily with a meal. 06/15/19   Plotnikov, Evie Lacks, MD  Cholecalciferol (VITAMIN D3) 1.25 MG (50000 UT) CAPS Take 1 capsule by mouth every 14 (fourteen) days. 09/12/19   Plotnikov, Evie Lacks, MD  ciprofloxacin (CIPRO) 250 MG tablet Take 1 tablet (250 mg total) by mouth 2 (two) times daily. 11/18/19   Plotnikov, Evie Lacks, MD  colesevelam (WELCHOL) 625 MG tablet Take 2 tablets (1,250 mg total) by mouth 2 (two) times daily with a meal. 04/20/18 04/20/19  Plotnikov, Evie Lacks, MD  diphenoxylate-atropine (LOMOTIL) 2.5-0.025 MG tablet Take 1-2 tablets by mouth 4 (four) times daily as needed for diarrhea or loose stools. 09/12/19   Plotnikov, Evie Lacks, MD  enoxaparin (LOVENOX) 100 MG/ML injection Inject 0.9 mLs (90 mg total) into  the skin daily. 08/02/18   Pennie Banter, RPH-CPP  EPINEPHrine 0.3 mg/0.3 mL IJ SOAJ injection USE AS DIRECTED 10/12/18   Plotnikov, Evie Lacks, MD  ferrous sulfate 325 (65 FE) MG tablet Take 1 tablet (325 mg total) by mouth every other day. 06/15/19   Plotnikov, Evie Lacks, MD  fluticasone (FLONASE) 50 MCG/ACT nasal spray Place 2 sprays into both nostrils daily. 06/15/19   Plotnikov, Evie Lacks, MD  lacosamide (VIMPAT) 50 MG TABS tablet Take 1 tablet (50 mg total) by mouth 2 (two) times daily. 09/12/19   Plotnikov, Evie Lacks, MD  losartan (COZAAR) 100 MG tablet Take 1 tablet (100 mg total) by mouth daily. 09/12/19   Plotnikov, Evie Lacks, MD  methylPREDNISolone (MEDROL) 4 MG tablet Take 6 tabs po day 1, 5 tabs po day 2, 4 tabs po day 3, 3 tabs po day 4, 2 tabs po day 5, 1 tab po day 6 then stop 10/07/18   Hayden Pedro, PA-C  mupirocin ointment [facility]) 2 % On leg wound w/dressing change qd or bid 02/22/19   Plotnikov, Evie Lacks, MD  nitrofurantoin, macrocrystal-monohydrate, (MACROBID) 100 MG capsule Take 1 capsule (100 mg total) by mouth 2 (two) times daily. 09/28/18   Marrian Salvage, FNP  Probiotic Product (ALIGN PO) Take by mouth. One cap by mouth Bid.    [provider]  traMADol (ULTRAM) 50 MG tablet Take 1-2 tablets (50-100 mg total) by mouth 2 (two) times daily as needed for severe pain. 06/15/19   Plotnikov, Evie Lacks, MD  valACYclovir (VALTREX) 1000 MG tablet Take 1 tablet (1,000 mg total) by mouth 2 (two) times daily. 06/15/19   Plotnikov, Evie Lacks, MD  warfarin (COUMADIN) 5 MG tablet Take 1 1/2 tablets daily or as directed by anticoagulation clinic. 09/12/19   Plotnikov, Evie Lacks, MD  zolpidem (AMBIEN) 10 MG tablet 1 & 1/2 TABLETS (15MG) BY MOUTH AT BEDTIME AS NEEDED FOR SLEEP, OK TOREPEAT IN 4HRS 11/30/19   Plotnikov, Evie Lacks, MD    Allergies    Bee venom, Ivp dye [iodinated diagnostic agents], Shellfish allergy, Temazepam, Amlodipine besylate, Aspirin, Atorvastatin,  Cholestyramine, Codeine phosphate, Demerol [meperidine],  Diphenhydramine hcl, Doxycycline, Gentamicin sulfate, Keppra [levetiracetam], Meclizine hcl, Meperidine hcl, Metronidazole, Moxifloxacin, Sulfamethoxazole, and Penicillins  Review of Systems   Review of Systems  All other systems reviewed and are negative.   Physical Exam Updated Vital Signs BP (!) 193/105 (BP Location: Left Arm)   Pulse 98   Temp (!) 102.3 F (39.1 C) (Oral)   Resp 19   SpO2 95%   Physical Exam Vitals and nursing note reviewed.  Constitutional:      General: She is not in acute distress.    Appearance: She is well-developed. She is not diaphoretic.  HENT:     Head: Normocephalic and atraumatic.  Cardiovascular:     Rate and Rhythm: Normal rate and regular rhythm.     Heart sounds: No murmur heard.  No friction rub. No gallop.   Pulmonary:     Effort: Pulmonary effort is normal. No respiratory distress.     Breath sounds: Examination of the right-middle field reveals rhonchi. Examination of the left-middle field reveals rhonchi. Rhonchi present. No wheezing.  Abdominal:     General: Bowel sounds are normal. There is no distension.     Palpations: Abdomen is soft.     Tenderness: There is no abdominal tenderness.  Musculoskeletal:        General: Normal range of motion.     Cervical back: Normal range of motion and neck supple.     Right lower leg: No tenderness. No edema.     Left lower leg: No tenderness. No edema.  Skin:    General: Skin is warm and dry.  Neurological:     Mental Status: She is alert and oriented to person, place, and time.     ED Results / Procedures / Treatments   Labs (all labs ordered are listed, but only abnormal results are displayed) Labs Reviewed  SARS CORONAVIRUS 2 BY RT PCR (HOSPITAL ORDER, [facility])  BASIC METABOLIC PANEL  CBC  TROPONIN I (HIGH SENSITIVITY)    EKG EKG Interpretation  Date/Time:  Wednesday January 04 2020 08:40:17  EDT Ventricular Rate:  93 PR Interval:    QRS Duration: 88 QT Interval:  333 QTC Calculation: 415 R Axis:   51 Text Interpretation: Sinus rhythm Probable anteroseptal infarct, recent No significant change since 10/20/2017 Confirmed by Veryl Speak [id number]) on 01/04/2020 9:20:35 AM   Radiology DG Chest 2 View  Result Date: 01/04/2020 CLINICAL DATA:  Productive cough and chest congestion since Sunday. EXAM: CHEST - 2 VIEW COMPARISON:  10/20/2017 FINDINGS: The cardiac silhouette, mediastinal and hilar contours are within normal limits and stable. Mild tortuosity and calcification of the thoracic aorta. Mild underlying emphysematous and chronic bronchitic changes but no definite infiltrates, edema or effusions. The bony thorax is intact. IMPRESSION: Chronic lung changes but no acute pulmonary findings. Electronically Signed   By: Marijo Sanes M.D.   On: 01/04/2020 09:05    Procedures Procedures (including critical care time)  Medications Ordered in ED Medications  acetaminophen (TYLENOL) tablet 650 mg (has no administration in time range)    ED Course  I have reviewed the triage vital signs and the nursing notes.  Pertinent labs & imaging results that were available during my care of the patient were reviewed by me and considered in my medical decision making (see chart for details).    MDM Rules/Calculators/A&P  Patient presenting here with complaints of fever and cough. Patient's work-up shows negative chest x-ray for pneumonia. Patient also with negative Covid  test. Patient will be treated for bronchitis with Zithromax, prednisone, and given an MDI inhaler. She also be given cough medication to take primarily at night.  Patient has no hypoxia, tachycardia, and vital signs are otherwise stable. She appears clinically well and I believe appropriate for outpatient therapy. If she worsens, she is to return to the ER.  Final Clinical Impression(s) / ED Diagnoses Final diagnoses:  None      Rx / DC Orders ED Discharge Orders    None       Veryl Speak, MD 01/04/20 1116

## 2020-01-16 ENCOUNTER — Ambulatory Visit (INDEPENDENT_AMBULATORY_CARE_PROVIDER_SITE_OTHER): Payer: Medicare Other

## 2020-01-16 ENCOUNTER — Encounter: Payer: Self-pay | Admitting: Internal Medicine

## 2020-01-16 ENCOUNTER — Ambulatory Visit (INDEPENDENT_AMBULATORY_CARE_PROVIDER_SITE_OTHER): Payer: Medicare Other | Admitting: Internal Medicine

## 2020-01-16 ENCOUNTER — Other Ambulatory Visit: Payer: Self-pay

## 2020-01-16 VITALS — BP 152/90 | HR 72 | Temp 98.1°F | Ht 65.0 in | Wt 124.0 lb

## 2020-01-16 DIAGNOSIS — R531 Weakness: Secondary | ICD-10-CM | POA: Diagnosis not present

## 2020-01-16 DIAGNOSIS — Z7901 Long term (current) use of anticoagulants: Secondary | ICD-10-CM | POA: Diagnosis not present

## 2020-01-16 DIAGNOSIS — J069 Acute upper respiratory infection, unspecified: Secondary | ICD-10-CM | POA: Diagnosis not present

## 2020-01-16 DIAGNOSIS — R202 Paresthesia of skin: Secondary | ICD-10-CM | POA: Diagnosis not present

## 2020-01-16 DIAGNOSIS — I10 Essential (primary) hypertension: Secondary | ICD-10-CM | POA: Diagnosis not present

## 2020-01-16 DIAGNOSIS — E559 Vitamin D deficiency, unspecified: Secondary | ICD-10-CM | POA: Diagnosis not present

## 2020-01-16 DIAGNOSIS — D6859 Other primary thrombophilia: Secondary | ICD-10-CM | POA: Diagnosis not present

## 2020-01-16 DIAGNOSIS — R5383 Other fatigue: Secondary | ICD-10-CM | POA: Diagnosis not present

## 2020-01-16 LAB — COMPLETE METABOLIC PANEL WITH GFR
AG Ratio: 1.3 (calc) (ref 1.0–2.5)
ALT: 11 U/L (ref 6–29)
AST: 19 U/L (ref 10–35)
Albumin: 3.8 g/dL (ref 3.6–5.1)
Alkaline phosphatase (APISO): 46 U/L (ref 37–153)
BUN: 11 mg/dL (ref 7–25)
CO2: 28 mmol/L (ref 20–32)
Calcium: 9.2 mg/dL (ref 8.6–10.4)
Chloride: 98 mmol/L (ref 98–110)
Creat: 0.88 mg/dL (ref 0.60–0.88)
GFR, Est African American: 70 mL/min/{1.73_m2} (ref >=60)
GFR, Est Non African American: 60 mL/min/{1.73_m2} (ref >=60)
Globulin: 2.9 g/dL (calc) (ref 1.9–3.7)
Glucose, Bld: 88 mg/dL (ref 65–99)
Potassium: 4.3 mmol/L (ref 3.5–5.3)
Sodium: 132 mmol/L — ABNORMAL LOW (ref 135–146)
Total Bilirubin: 0.5 mg/dL (ref 0.2–1.2)
Total Protein: 6.7 g/dL (ref 6.1–8.1)

## 2020-01-16 MED ORDER — BENZONATATE 100 MG PO CAPS: 100.0000 mg | ORAL_CAPSULE | Freq: Three times a day (TID) | ORAL | 0 refills | Status: DC | PRN

## 2020-01-16 NOTE — Assessment & Plan Note (Signed)
On Coumadin 

## 2020-01-16 NOTE — Progress Notes (Signed)
Subjective:  Patient ID: Tracy Peters, female    DOB: Dec 18, 1933  Age: 86 y.o. MRN: [id number]  CC: No chief complaint on file.   HPI Tracy Peters presents for URI since Jan 03, 2020 - she took a Zpac and prednisone 2 wks ago. Feeling better....ER notes reviewed  Outpatient Medications Prior to Visit  Medication Sig Dispense Refill  . azithromycin (ZITHROMAX) 250 MG tablet Take 1 tablet (250 mg total) by mouth daily. 4 tablet 0  . Cholecalciferol (VITAMIN D3) 1.25 MG (50000 UT) CAPS Take 1 capsule by mouth every 14 (fourteen) days. 6 capsule 3  . diphenoxylate-atropine (LOMOTIL) 2.5-0.025 MG tablet Take 1-2 tablets by mouth 4 (four) times daily as needed for diarrhea or loose stools. 120 tablet 2  . enalapril (VASOTEC) 10 MG tablet Take 10 mg by mouth daily.    Marland Kitchen EPINEPHrine 0.3 mg/0.3 mL IJ SOAJ injection USE AS DIRECTED (Patient taking differently: Inject 0.3 mg into the muscle as needed for anaphylaxis. ) 2 Device 1  . ferrous sulfate 325 (65 FE) MG tablet Take 1 tablet (325 mg total) by mouth every other day.    . fluticasone (FLONASE) 50 MCG/ACT nasal spray Place 2 sprays into both nostrils daily. 48 g 3  . lacosamide (VIMPAT) 50 MG TABS tablet Take 1 tablet (50 mg total) by mouth 2 (two) times daily. 180 tablet 3  . losartan (COZAAR) 100 MG tablet Take 1 tablet (100 mg total) by mouth daily. 90 tablet 3  . predniSONE (DELTASONE) 10 MG tablet Take 2 tablets (20 mg total) by mouth 2 (two) times daily with a meal. 12 tablet 0  . Probiotic Product (ALIGN PO) Take 1 capsule by mouth in the morning and at bedtime.     Marland Kitchen tobramycin-dexamethasone (TOBRADEX) ophthalmic solution Place 1 drop into both eyes at bedtime.    . traMADol (ULTRAM) 50 MG tablet Take 1-2 tablets (50-100 mg total) by mouth 2 (two) times daily as needed for severe pain. 120 tablet 2  . valACYclovir (VALTREX) 1000 MG tablet Take 1 tablet (1,000 mg total) by mouth 2 (two) times daily. 180 tablet 1  .  warfarin (COUMADIN) 5 MG tablet Take 1 1/2 tablets daily or as directed by anticoagulation clinic. (Patient taking differently: Take 7.5 mg by mouth every evening. Unless otherwise directed by anticoag clinic) 135 tablet 1  . zolpidem (AMBIEN) 10 MG tablet 1 & 1/2 TABLETS (15MG ) BY MOUTH AT BEDTIME AS NEEDED FOR SLEEP, OK TOREPEAT IN 4HRS (Patient taking differently: Take 15 mg by mouth at bedtime as needed for sleep (May repeat in 4 hrs if needed after intial dose). ) 135 tablet 1  . benzonatate (TESSALON) 100 MG capsule Take 1 capsule (100 mg total) by mouth every 8 (eight) hours. (Patient not taking: Reported on 01/16/2020) 21 capsule 0   No facility-administered medications prior to visit.    ROS: Review of Systems  Constitutional: Positive for fatigue. Negative for activity change, appetite change, chills and unexpected weight change.  HENT: Negative for congestion, mouth sores and sinus pressure.   Eyes: Negative for visual disturbance.  Respiratory: Positive for cough and shortness of breath. Negative for chest tightness.   Gastrointestinal: Negative for abdominal pain and nausea.  Genitourinary: Negative for difficulty urinating, frequency and vaginal pain.  Musculoskeletal: Negative for back pain and gait problem.  Skin: Negative for pallor and rash.  Neurological: Negative for dizziness, tremors, weakness, numbness and headaches.  Psychiatric/Behavioral: Negative for confusion and sleep disturbance.  The patient is nervous/anxious.     Objective:  BP (!) 152/90 (BP Location: Left Arm, Patient Position: Sitting, Cuff Size: Normal)   Pulse 72   Temp 98.1 F (36.7 C) (Oral)   Ht 5\' 5"  (1.651 m)   Wt 124 lb (56.2 kg)   SpO2 99%   BMI 20.63 kg/m   BP Readings from Last 3 Encounters:  01/16/20 (!) 152/90  01/04/20 (!) 158/86  09/12/19 (!) 166/88    Wt Readings from Last 3 Encounters:  01/16/20 124 lb (56.2 kg)  09/12/19 127 lb (57.6 kg)  06/15/19 130 lb (59 kg)     Physical Exam Constitutional:      General: She is not in acute distress.    Appearance: She is well-developed.  HENT:     Head: Normocephalic.     Right Ear: External ear normal.     Left Ear: External ear normal.     Nose: Nose normal.  Eyes:     General:        Right eye: No discharge.        Left eye: No discharge.     Conjunctiva/sclera: Conjunctivae normal.     Pupils: Pupils are equal, round, and reactive to light.  Neck:     Thyroid: No thyromegaly.     Vascular: No JVD.     Trachea: No tracheal deviation.  Cardiovascular:     Rate and Rhythm: Normal rate and regular rhythm.     Heart sounds: Normal heart sounds.  Pulmonary:     Effort: No respiratory distress.     Breath sounds: No stridor. No wheezing.  Abdominal:     General: Bowel sounds are normal. There is no distension.     Palpations: Abdomen is soft. There is no mass.     Tenderness: There is no abdominal tenderness. There is no guarding or rebound.  Musculoskeletal:        General: No tenderness.     Cervical back: Normal range of motion and neck supple.  Lymphadenopathy:     Cervical: No cervical adenopathy.  Skin:    Findings: No erythema or rash.  Neurological:     Cranial Nerves: No cranial nerve deficit.     Motor: No abnormal muscle tone.     Coordination: Coordination abnormal.     Deep Tendon Reflexes: Reflexes normal.  Psychiatric:        Behavior: Behavior normal.        Thought Content: Thought content normal.        Judgment: Judgment normal.    Looks tired   Lab Results  Component Value Date   WBC 6.9 01/04/2020   HGB 12.1 01/04/2020   HCT 35.9 (L) 01/04/2020   PLT 187 01/04/2020   GLUCOSE 99 01/04/2020   CHOL 196 01/03/2016   TRIG 85.0 01/03/2016   HDL 63.10 01/03/2016   LDLDIRECT 129.8 05/31/2012   LDLCALC 116 (H) 01/03/2016   ALT 13 09/12/2019   AST 19 09/12/2019   NA 129 (L) 01/04/2020   K 3.9 01/04/2020   CL 93 (L) 01/04/2020   CREATININE 1.02 (H) 01/04/2020    BUN 12 01/04/2020   CO2 25 01/04/2020   TSH 3.51 09/12/2019   INR 2.4 11/22/2019    DG Chest 2 View  Result Date: 01/04/2020 CLINICAL DATA:  Productive cough and chest congestion since Sunday. EXAM: CHEST - 2 VIEW COMPARISON:  10/20/2017 FINDINGS: The cardiac silhouette, mediastinal and hilar contours are within normal limits and  stable. Mild tortuosity and calcification of the thoracic aorta. Mild underlying emphysematous and chronic bronchitic changes but no definite infiltrates, edema or effusions. The bony thorax is intact. IMPRESSION: Chronic lung changes but no acute pulmonary findings. Electronically Signed   By: Marijo Sanes M.D.   On: 01/04/2020 09:05    Assessment & Plan:    [name], MD

## 2020-01-16 NOTE — Assessment & Plan Note (Signed)
Post-URI

## 2020-01-16 NOTE — Assessment & Plan Note (Signed)
Repeat CXR Slow improvement

## 2020-01-16 NOTE — Assessment & Plan Note (Signed)
BP Readings from Last 3 Encounters:  01/16/20 (!) 152/90  01/04/20 (!) 158/86  09/12/19 (!) 166/88

## 2020-01-16 NOTE — Addendum Note (Signed)
Addended by: Steward Ros on: 01/16/2020 11:18 AM   Modules accepted: Orders

## 2020-01-17 ENCOUNTER — Other Ambulatory Visit: Payer: Self-pay | Admitting: Internal Medicine

## 2020-01-17 ENCOUNTER — Ambulatory Visit: Payer: Medicare Other

## 2020-01-17 LAB — CBC WITH DIFFERENTIAL/PLATELET
Absolute Monocytes: 1061 cells/uL — ABNORMAL HIGH (ref 200–950)
Basophils Absolute: 61 cells/uL (ref 0–200)
Basophils Relative: 0.7 %
Eosinophils Absolute: 78 cells/uL (ref 15–500)
Eosinophils Relative: 0.9 %
HCT: 37.1 % (ref 35.0–45.0)
Hemoglobin: 12.2 g/dL (ref 11.7–15.5)
Lymphs Abs: 2297 cells/uL (ref 850–3900)
MCH: 33.7 pg — ABNORMAL HIGH (ref 27.0–33.0)
MCHC: 32.9 g/dL (ref 32.0–36.0)
MCV: 102.5 fL — ABNORMAL HIGH (ref 80.0–100.0)
MPV: 9.8 fL (ref 7.5–12.5)
Monocytes Relative: 12.2 %
Neutro Abs: 5203 cells/uL (ref 1500–7800)
Neutrophils Relative %: 59.8 %
Platelets: 303 10*3/uL (ref 140–400)
RBC: 3.62 10*6/uL — ABNORMAL LOW (ref 3.80–5.10)
RDW: 12.5 % (ref 11.0–15.0)
Total Lymphocyte: 26.4 %
WBC: 8.7 10*3/uL (ref 3.8–10.8)

## 2020-01-17 LAB — URINALYSIS
Bilirubin Urine: NEGATIVE
Glucose, UA: NEGATIVE
Ketones, ur: NEGATIVE
Leukocytes,Ua: NEGATIVE
Nitrite: NEGATIVE
Protein, ur: NEGATIVE
Specific Gravity, Urine: 1.009 (ref 1.001–1.03)
pH: 6 (ref 5.0–8.0)

## 2020-01-17 LAB — VITAMIN D 25 HYDROXY (VIT D DEFICIENCY, FRACTURES): Vit D, 25-Hydroxy: 30 ng/mL (ref 30–100)

## 2020-01-17 LAB — TSH: TSH: 4.99 mIU/L — ABNORMAL HIGH (ref 0.40–4.50)

## 2020-01-17 LAB — VITAMIN B12: Vitamin B-12: 522 pg/mL (ref 200–1100)

## 2020-01-17 MED ORDER — VITAMIN D3 1.25 MG (50000 UT) PO CAPS: ORAL_CAPSULE | ORAL | 3 refills | Status: DC

## 2020-02-16 ENCOUNTER — Telehealth: Payer: Self-pay

## 2020-02-16 NOTE — Telephone Encounter (Signed)
LVM for pt to RTN my call to schedule AWV-S with [facility]

## 2020-02-28 ENCOUNTER — Encounter: Payer: Self-pay | Admitting: Internal Medicine

## 2020-02-28 ENCOUNTER — Other Ambulatory Visit: Payer: Self-pay

## 2020-02-28 ENCOUNTER — Ambulatory Visit: Payer: Medicare Other | Admitting: General Practice

## 2020-02-28 ENCOUNTER — Ambulatory Visit (INDEPENDENT_AMBULATORY_CARE_PROVIDER_SITE_OTHER): Payer: Medicare Other | Admitting: Internal Medicine

## 2020-02-28 ENCOUNTER — Other Ambulatory Visit: Payer: Self-pay | Admitting: Internal Medicine

## 2020-02-28 VITALS — BP 180/92 | HR 81 | Temp 98.1°F | Ht 65.0 in | Wt 122.0 lb

## 2020-02-28 DIAGNOSIS — Z23 Encounter for immunization: Secondary | ICD-10-CM

## 2020-02-28 DIAGNOSIS — Z86718 Personal history of other venous thrombosis and embolism: Secondary | ICD-10-CM | POA: Diagnosis not present

## 2020-02-28 DIAGNOSIS — G4700 Insomnia, unspecified: Secondary | ICD-10-CM

## 2020-02-28 DIAGNOSIS — I742 Embolism and thrombosis of arteries of the upper extremities: Secondary | ICD-10-CM

## 2020-02-28 DIAGNOSIS — I10 Essential (primary) hypertension: Secondary | ICD-10-CM | POA: Diagnosis not present

## 2020-02-28 DIAGNOSIS — E871 Hypo-osmolality and hyponatremia: Secondary | ICD-10-CM

## 2020-02-28 DIAGNOSIS — R413 Other amnesia: Secondary | ICD-10-CM

## 2020-02-28 MED ORDER — WARFARIN SODIUM 5 MG PO TABS: ORAL_TABLET | ORAL | 1 refills | Status: DC

## 2020-02-28 MED ORDER — ZOLPIDEM TARTRATE 10 MG PO TABS: ORAL_TABLET | ORAL | 1 refills | Status: DC

## 2020-02-28 MED ORDER — AMLODIPINE BESYLATE 2.5 MG PO TABS: 2.5000 mg | ORAL_TABLET | Freq: Every day | ORAL | 3 refills | Status: DC

## 2020-02-28 NOTE — Assessment & Plan Note (Signed)
High BP. We added Norvasc

## 2020-02-28 NOTE — Assessment & Plan Note (Signed)
Better  

## 2020-02-28 NOTE — Assessment & Plan Note (Signed)
Coumadin 

## 2020-02-28 NOTE — Progress Notes (Signed)
Subjective:  Patient ID: Tracy Peters, female    DOB: [birth year]  Age: 86 y.o. MRN: [id number]  CC: No chief complaint on file.   HPI Tracy Peters presents for insomnia - she has to take 30 mg total of Ambien per night 4 hrs apart - otherwise not able to sleep... - ran out early... F/u memory issues - better, anticoagulation, HTN f/u  Outpatient Medications Prior to Visit  Medication Sig Dispense Refill  . azithromycin (ZITHROMAX) 250 MG tablet Take 1 tablet (250 mg total) by mouth daily. 4 tablet 0  . benzonatate (TESSALON) 100 MG capsule Take 1-2 capsules (100-200 mg total) by mouth 3 (three) times daily as needed for cough. 60 capsule 0  . Cholecalciferol (VITAMIN D3) 1.25 MG (50000 UT) CAPS Take 1 capsule every 10 days 9 capsule 3  . diphenoxylate-atropine (LOMOTIL) 2.5-0.025 MG tablet Take 1-2 tablets by mouth 4 (four) times daily as needed for diarrhea or loose stools. 120 tablet 2  . enalapril (VASOTEC) 10 MG tablet Take 10 mg by mouth daily.    Marland Kitchen EPINEPHrine 0.3 mg/0.3 mL IJ SOAJ injection USE AS DIRECTED (Patient taking differently: Inject 0.3 mg into the muscle as needed for anaphylaxis. ) 2 Device 1  . ferrous sulfate 325 (65 FE) MG tablet Take 1 tablet (325 mg total) by mouth every other day.    . fluticasone (FLONASE) 50 MCG/ACT nasal spray Place 2 sprays into both nostrils daily. 48 g 3  . lacosamide (VIMPAT) 50 MG TABS tablet Take 1 tablet (50 mg total) by mouth 2 (two) times daily. 180 tablet 3  . losartan (COZAAR) 100 MG tablet Take 1 tablet (100 mg total) by mouth daily. 90 tablet 3  . Probiotic Product (ALIGN PO) Take 1 capsule by mouth in the morning and at bedtime.     Marland Kitchen tobramycin-dexamethasone (TOBRADEX) ophthalmic solution Place 1 drop into both eyes at bedtime.    . traMADol (ULTRAM) 50 MG tablet Take 1-2 tablets (50-100 mg total) by mouth 2 (two) times daily as needed for severe pain. 120 tablet 2  . valACYclovir (VALTREX) 1000 MG tablet Take 1  tablet (1,000 mg total) by mouth 2 (two) times daily. 180 tablet 1  . warfarin (COUMADIN) 5 MG tablet Take 1 1/2 tablets daily or as directed by anticoagulation clinic. (Patient taking differently: Take 7.5 mg by mouth every evening. Unless otherwise directed by anticoag clinic) 135 tablet 1  . zolpidem (AMBIEN) 10 MG tablet 1 & 1/2 TABLETS (15MG ) BY MOUTH AT BEDTIME AS NEEDED FOR SLEEP, OK TOREPEAT IN 4HRS (Patient taking differently: Take 15 mg by mouth at bedtime as needed for sleep (May repeat in 4 hrs if needed after intial dose). ) 135 tablet 1   No facility-administered medications prior to visit.    ROS: Review of Systems  Constitutional: Positive for fatigue. Negative for activity change, appetite change, chills and unexpected weight change.  HENT: Negative for congestion, mouth sores and sinus pressure.   Eyes: Negative for visual disturbance.  Respiratory: Negative for cough and chest tightness.   Gastrointestinal: Negative for abdominal pain and nausea.  Genitourinary: Negative for difficulty urinating, frequency and vaginal pain.  Musculoskeletal: Positive for arthralgias and back pain. Negative for gait problem.  Skin: Negative for pallor and rash.  Neurological: Negative for dizziness, tremors, weakness, numbness and headaches.  Psychiatric/Behavioral: Positive for sleep disturbance. Negative for confusion.    Objective:  BP (!) 180/92 (BP Location: Left Arm, Patient Position: Sitting, Cuff  Size: Normal)   Pulse 81   Temp 98.1 F (36.7 C) (Oral)   Ht 5\' 5"  (1.651 m)   Wt 122 lb (55.3 kg)   SpO2 98%   BMI 20.30 kg/m   BP Readings from Last 3 Encounters:  02/28/20 (!) 180/92  01/16/20 (!) 152/90  01/04/20 (!) 158/86    Wt Readings from Last 3 Encounters:  02/28/20 122 lb (55.3 kg)  01/16/20 124 lb (56.2 kg)  09/12/19 127 lb (57.6 kg)    Physical Exam Constitutional:      General: She is not in acute distress.    Appearance: She is well-developed.  HENT:      Head: Normocephalic.     Right Ear: External ear normal.     Left Ear: External ear normal.     Nose: Nose normal.  Eyes:     General:        Right eye: No discharge.        Left eye: No discharge.     Conjunctiva/sclera: Conjunctivae normal.     Pupils: Pupils are equal, round, and reactive to light.  Neck:     Thyroid: No thyromegaly.     Vascular: No JVD.     Trachea: No tracheal deviation.  Cardiovascular:     Rate and Rhythm: Normal rate and regular rhythm.     Heart sounds: Normal heart sounds.  Pulmonary:     Effort: No respiratory distress.     Breath sounds: No stridor. No wheezing.  Abdominal:     General: Bowel sounds are normal. There is no distension.     Palpations: Abdomen is soft. There is no mass.     Tenderness: There is no abdominal tenderness. There is no guarding or rebound.  Musculoskeletal:        General: Tenderness present.     Cervical back: Normal range of motion and neck supple.  Lymphadenopathy:     Cervical: No cervical adenopathy.  Skin:    Findings: No erythema or rash.  Neurological:     Cranial Nerves: No cranial nerve deficit.     Motor: No abnormal muscle tone.     Coordination: Coordination abnormal.     Gait: Gait abnormal.     Deep Tendon Reflexes: Reflexes normal.  Psychiatric:        Behavior: Behavior normal.        Thought Content: Thought content normal.        Judgment: Judgment normal.     Lab Results  Component Value Date   WBC 8.7 01/16/2020   HGB 12.2 01/16/2020   HCT 37.1 01/16/2020   PLT 303 01/16/2020   GLUCOSE 88 01/16/2020   CHOL 196 01/03/2016   TRIG 85.0 01/03/2016   HDL 63.10 01/03/2016   LDLDIRECT 129.8 05/31/2012   LDLCALC 116 (H) 01/03/2016   ALT 11 01/16/2020   AST 19 01/16/2020   NA 132 (L) 01/16/2020   K 4.3 01/16/2020   CL 98 01/16/2020   CREATININE 0.88 01/16/2020   BUN 11 01/16/2020   CO2 28 01/16/2020   TSH 4.99 (H) 01/16/2020   INR 2.4 11/22/2019    DG Chest 2 View  Result Date:  01/04/2020 CLINICAL DATA:  Productive cough and chest congestion since Sunday. EXAM: CHEST - 2 VIEW COMPARISON:  10/20/2017 FINDINGS: The cardiac silhouette, mediastinal and hilar contours are within normal limits and stable. Mild tortuosity and calcification of the thoracic aorta. Mild underlying emphysematous and chronic bronchitic changes but no definite  infiltrates, edema or effusions. The bony thorax is intact. IMPRESSION: Chronic lung changes but no acute pulmonary findings. Electronically Signed   By: Marijo Sanes M.D.   On: 01/04/2020 09:05    Assessment & Plan:   There are no diagnoses linked to this encounter.    [name], MD

## 2020-02-28 NOTE — Assessment & Plan Note (Signed)
BP Readings from Last 3 Encounters:  02/28/20 (!) 180/92  01/16/20 (!) 152/90  01/04/20 (!) 158/86

## 2020-02-28 NOTE — Assessment & Plan Note (Signed)
Labs

## 2020-02-28 NOTE — Assessment & Plan Note (Signed)
F/u insomnia - she has to take 30 mg total of Ambien per night 4 hrs apart - otherwise not able to sleep.Tracy KitchenMarland Peters

## 2020-02-29 DIAGNOSIS — H01001 Unspecified blepharitis right upper eyelid: Secondary | ICD-10-CM | POA: Diagnosis not present

## 2020-02-29 DIAGNOSIS — H5203 Hypermetropia, bilateral: Secondary | ICD-10-CM | POA: Diagnosis not present

## 2020-02-29 DIAGNOSIS — H01004 Unspecified blepharitis left upper eyelid: Secondary | ICD-10-CM | POA: Diagnosis not present

## 2020-02-29 DIAGNOSIS — H04123 Dry eye syndrome of bilateral lacrimal glands: Secondary | ICD-10-CM | POA: Diagnosis not present

## 2020-02-29 LAB — BASIC METABOLIC PANEL WITH GFR
BUN/Creatinine Ratio: 14 (calc) (ref 6–22)
BUN: 15 mg/dL (ref 7–25)
CO2: 28 mmol/L (ref 20–32)
Calcium: 9.6 mg/dL (ref 8.6–10.4)
Chloride: 97 mmol/L — ABNORMAL LOW (ref 98–110)
Creat: 1.08 mg/dL — ABNORMAL HIGH (ref 0.60–0.88)
GFR, Est African American: 55 mL/min/{1.73_m2} — ABNORMAL LOW (ref >=60)
GFR, Est Non African American: 47 mL/min/{1.73_m2} — ABNORMAL LOW (ref >=60)
Glucose, Bld: 90 mg/dL (ref 65–99)
Potassium: 4.6 mmol/L (ref 3.5–5.3)
Sodium: 133 mmol/L — ABNORMAL LOW (ref 135–146)

## 2020-03-07 ENCOUNTER — Ambulatory Visit: Payer: Medicare Other | Admitting: Internal Medicine

## 2020-03-15 ENCOUNTER — Other Ambulatory Visit: Payer: Self-pay

## 2020-03-15 ENCOUNTER — Ambulatory Visit (INDEPENDENT_AMBULATORY_CARE_PROVIDER_SITE_OTHER): Payer: Medicare Other | Admitting: General Practice

## 2020-03-15 DIAGNOSIS — Z7901 Long term (current) use of anticoagulants: Secondary | ICD-10-CM | POA: Diagnosis not present

## 2020-03-15 DIAGNOSIS — Z86718 Personal history of other venous thrombosis and embolism: Secondary | ICD-10-CM

## 2020-03-15 DIAGNOSIS — I742 Embolism and thrombosis of arteries of the upper extremities: Secondary | ICD-10-CM

## 2020-03-15 DIAGNOSIS — D6859 Other primary thrombophilia: Secondary | ICD-10-CM

## 2020-03-15 LAB — POCT INR: INR: 2.7 (ref 2.0–3.0)

## 2020-03-15 NOTE — Patient Instructions (Addendum)
Pre visit review using our clinic review tool, if applicable. No additional management support is needed unless otherwise documented below in the visit note.  Continue to take 1 1/2 tablets of the peach-colored warfarin tablets by mouth every day of the week.  Re-check in 6 weeks.   

## 2020-03-29 ENCOUNTER — Other Ambulatory Visit: Payer: Self-pay | Admitting: Internal Medicine

## 2020-03-29 MED ORDER — LACOSAMIDE 50 MG PO TABS: 50.0000 mg | ORAL_TABLET | Freq: Two times a day (BID) | ORAL | 3 refills | Status: DC

## 2020-03-31 IMAGING — MG MM PLC BREAST LOC DEV 1ST LESION INC MAMMO GUIDE*L*
8 of 9 series · 8 of 9 positions shown · non-contrast
Comparison: Previous exam(s).

CLINICAL DATA: Localization prior to surgery

EXAM:
MAMMOGRAPHIC GUIDED RADIOACTIVE SEED LOCALIZATION OF THE LEFT BREAST

[L CC (1 of 4)]
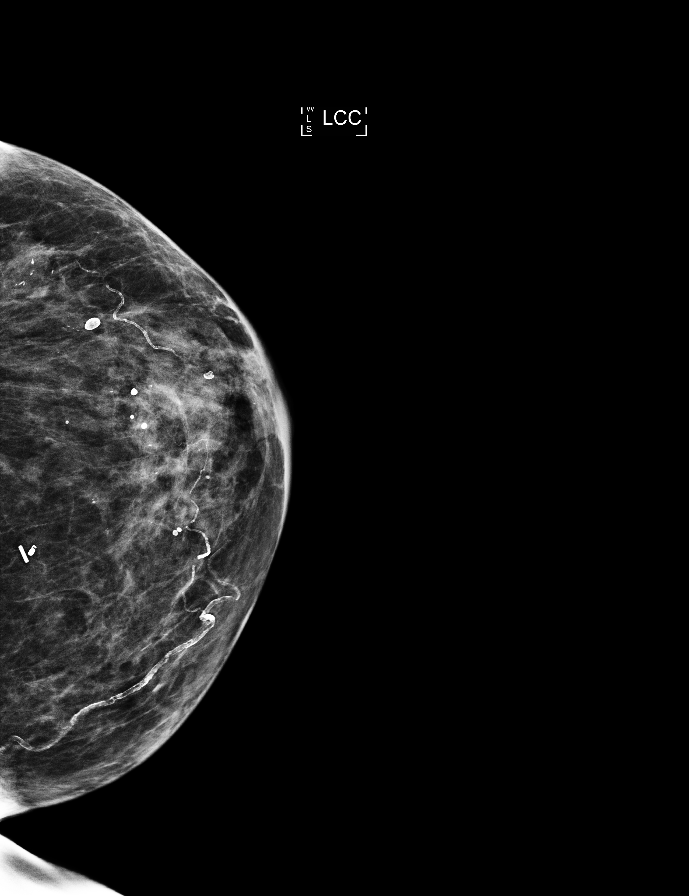

[L CC (2 of 4)]
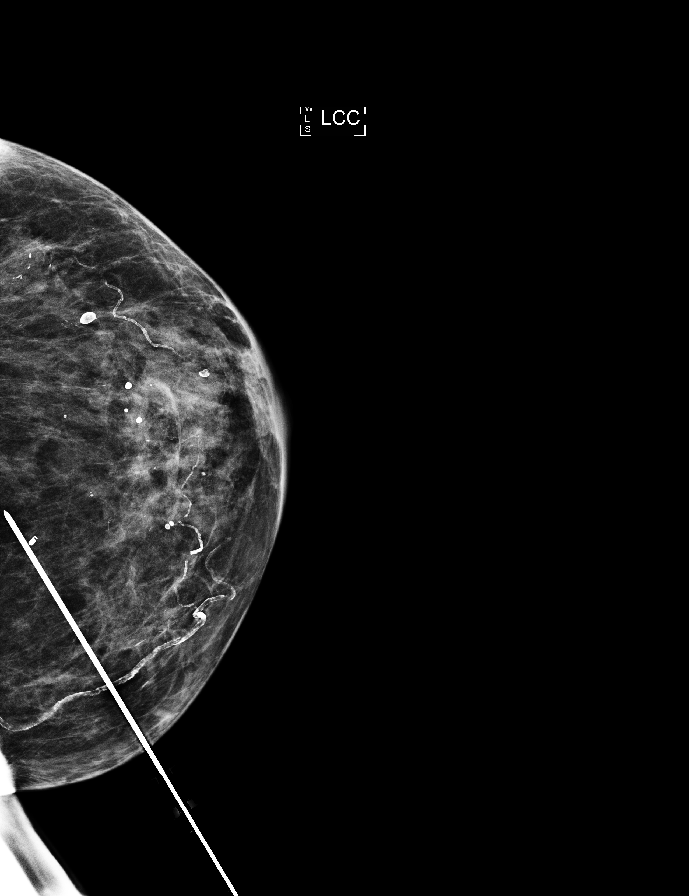

[L ML (1 of 3)]
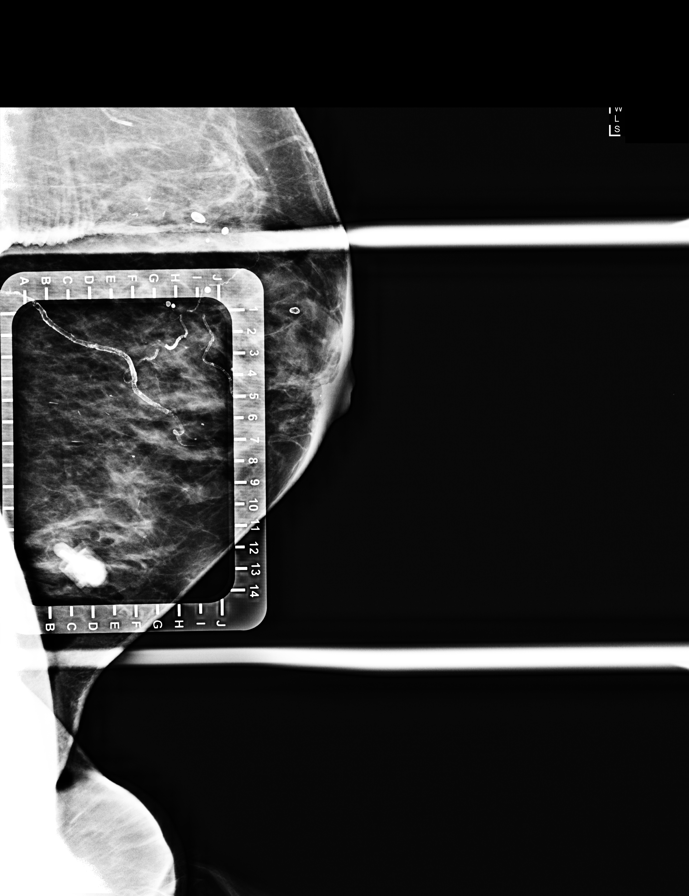

[L CC (3 of 4)]
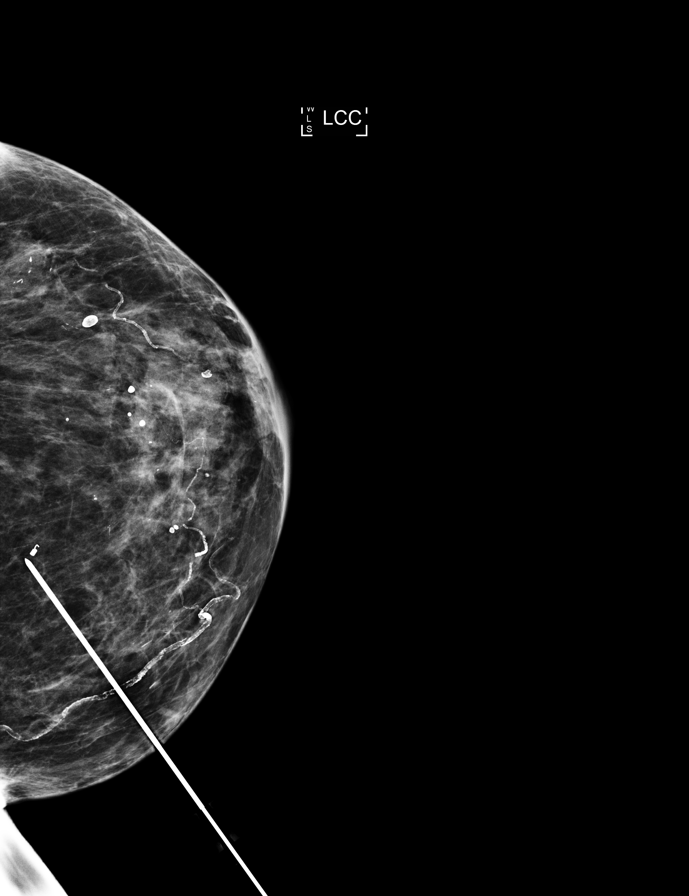

[L ML (2 of 3)]
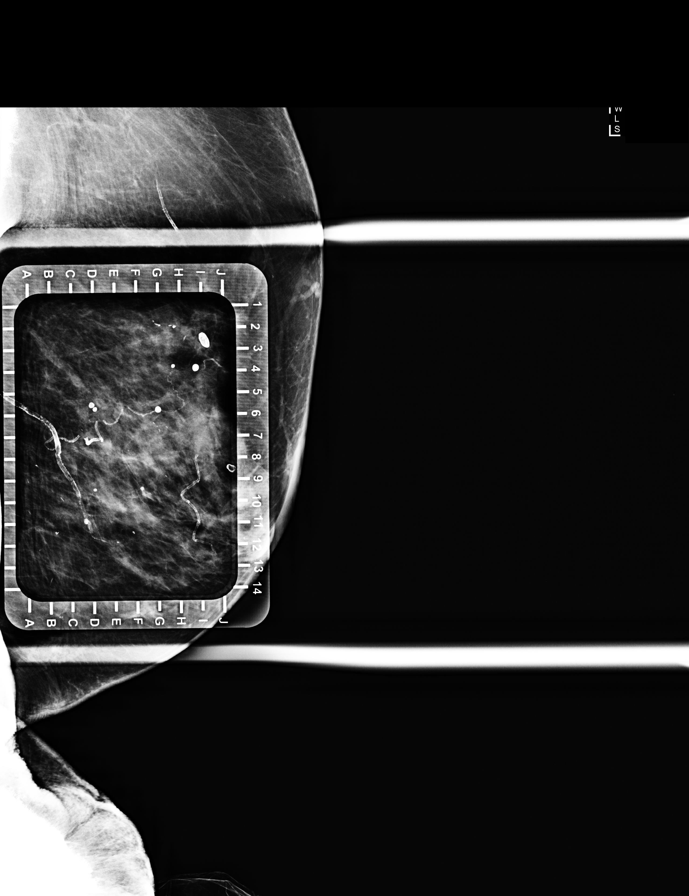

[L LM]
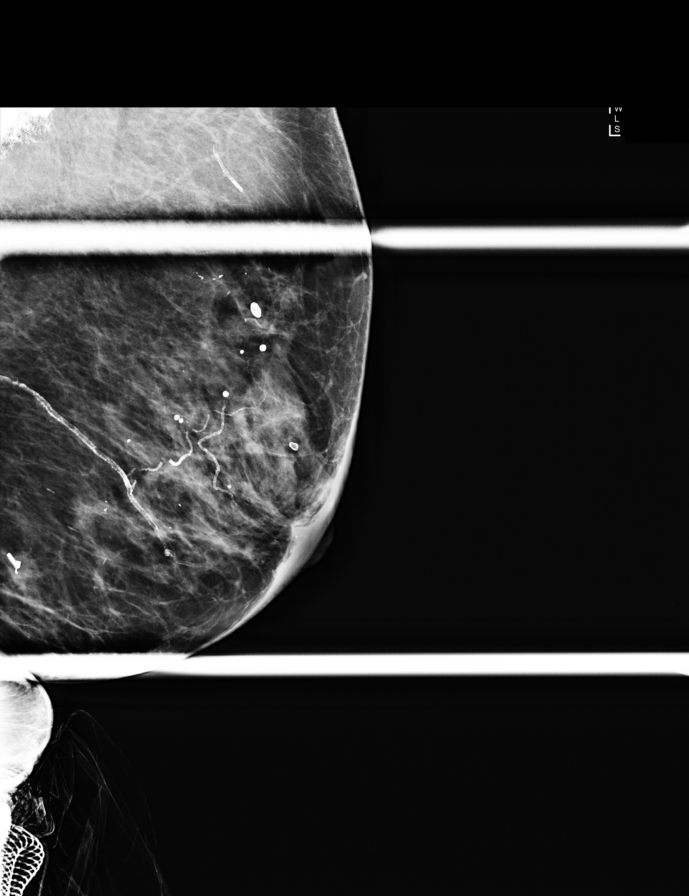

[L ML (3 of 3)]
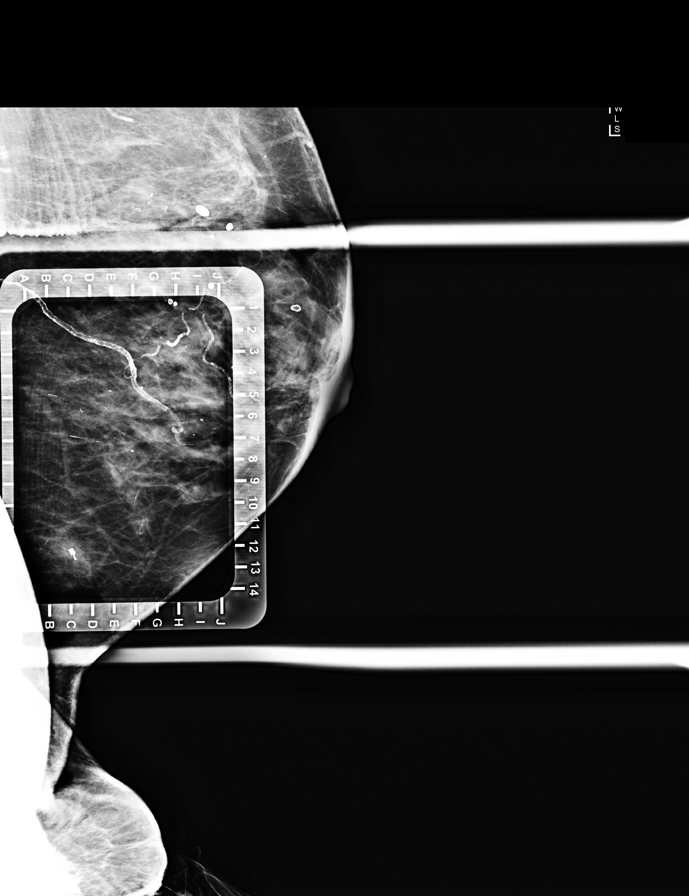

[L CC (4 of 4)]
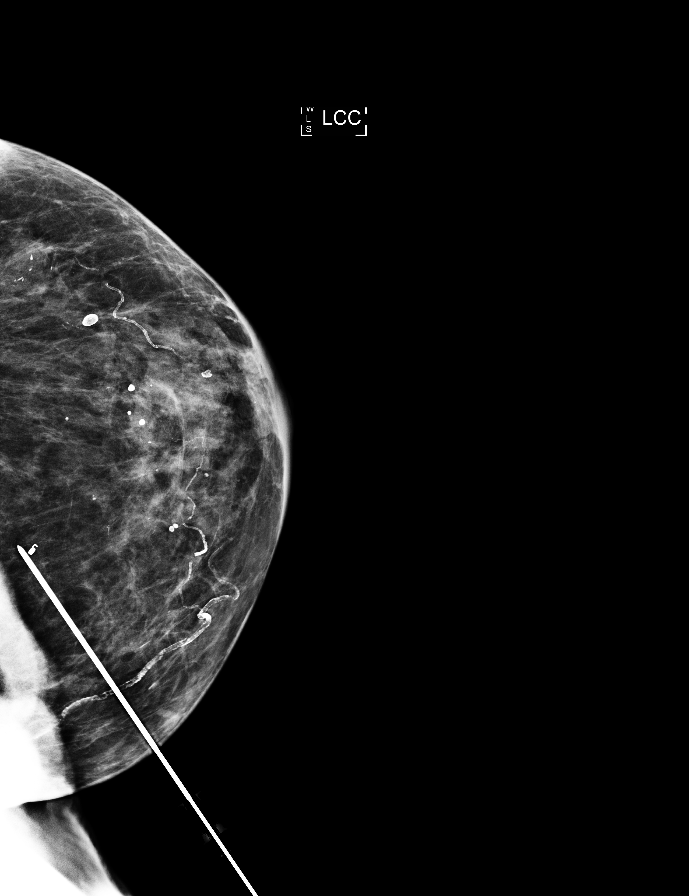

[8 of 9 positions shown; findings below may reference images not displayed]



The usual time-out protocol was performed immediately prior to the
procedure.

Using mammographic guidance, sterile technique, 1% lidocaine and an
W-HHT radioactive seed, the biopsy clip was localized using a medial
approach. The follow-up mammogram images confirm the seed in the
expected location and were marked for the surgeon.

Follow-up survey of the patient confirms presence of the radioactive
seed.

Order number of W-HHT seed:  [id number].

Total activity:  0.253 millicuries reference Date: July 19, 2018

The patient tolerated the procedure well and was released from the
[REDACTED]. She was given instructions regarding seed removal.
IMPRESSION: Radioactive seed localization left breast. No apparent
complications.

## 2020-04-06 DIAGNOSIS — R309 Painful micturition, unspecified: Secondary | ICD-10-CM | POA: Diagnosis not present

## 2020-04-06 DIAGNOSIS — N390 Urinary tract infection, site not specified: Secondary | ICD-10-CM | POA: Diagnosis not present

## 2020-04-18 DIAGNOSIS — M7061 Trochanteric bursitis, right hip: Secondary | ICD-10-CM | POA: Diagnosis not present

## 2020-04-22 ENCOUNTER — Other Ambulatory Visit: Payer: Self-pay | Admitting: Internal Medicine

## 2020-04-24 ENCOUNTER — Ambulatory Visit (INDEPENDENT_AMBULATORY_CARE_PROVIDER_SITE_OTHER): Payer: Medicare Other | Admitting: General Practice

## 2020-04-24 ENCOUNTER — Other Ambulatory Visit: Payer: Self-pay

## 2020-04-24 DIAGNOSIS — Z86718 Personal history of other venous thrombosis and embolism: Secondary | ICD-10-CM

## 2020-04-24 DIAGNOSIS — Z7901 Long term (current) use of anticoagulants: Secondary | ICD-10-CM | POA: Diagnosis not present

## 2020-04-24 DIAGNOSIS — D6859 Other primary thrombophilia: Secondary | ICD-10-CM | POA: Diagnosis not present

## 2020-04-24 DIAGNOSIS — I742 Embolism and thrombosis of arteries of the upper extremities: Secondary | ICD-10-CM

## 2020-04-24 LAB — POCT INR: INR: 1.6 — AB (ref 2.0–3.0)

## 2020-04-24 NOTE — Patient Instructions (Incomplete)
Pre visit review using our clinic review tool, if applicable. No additional management support is needed unless otherwise documented below in the visit note.  Take 2 tablets today and tomorrow and then continue to take 1 1/2 tablets of the peach-colored warfarin tablets by mouth every day of the week.  Re-check in 3 weeks.

## 2020-05-01 ENCOUNTER — Other Ambulatory Visit: Payer: Self-pay | Admitting: Internal Medicine

## 2020-05-14 ENCOUNTER — Other Ambulatory Visit: Payer: Self-pay | Admitting: Internal Medicine

## 2020-05-15 ENCOUNTER — Other Ambulatory Visit: Payer: Self-pay

## 2020-05-15 ENCOUNTER — Ambulatory Visit (INDEPENDENT_AMBULATORY_CARE_PROVIDER_SITE_OTHER): Payer: Medicare Other | Admitting: General Practice

## 2020-05-15 DIAGNOSIS — I742 Embolism and thrombosis of arteries of the upper extremities: Secondary | ICD-10-CM

## 2020-05-15 DIAGNOSIS — Z7901 Long term (current) use of anticoagulants: Secondary | ICD-10-CM | POA: Diagnosis not present

## 2020-05-15 DIAGNOSIS — Z86718 Personal history of other venous thrombosis and embolism: Secondary | ICD-10-CM

## 2020-05-15 DIAGNOSIS — D6859 Other primary thrombophilia: Secondary | ICD-10-CM

## 2020-05-15 LAB — POCT INR: INR: 2.7 (ref 2.0–3.0)

## 2020-05-15 NOTE — Patient Instructions (Incomplete)
Pre visit review using our clinic review tool, if applicable. No additional management support is needed unless otherwise documented below in the visit note.  Continue to take 1 1/2 tablets of the peach-colored warfarin tablets by mouth every day of the week.  Re-check in 4 to 5 weeks.

## 2020-05-18 DIAGNOSIS — M7061 Trochanteric bursitis, right hip: Secondary | ICD-10-CM | POA: Diagnosis not present

## 2020-05-18 NOTE — Telephone Encounter (Signed)
   Spouse calling for status of refill for Tramadol

## 2020-05-21 MED ORDER — DIPHENOXYLATE-ATROPINE 2.5-0.025 MG PO TABS
ORAL_TABLET | ORAL | 0 refills | Status: DC
Start: 2020-05-21 — End: 2020-08-13

## 2020-05-24 ENCOUNTER — Other Ambulatory Visit: Payer: Self-pay

## 2020-05-29 DIAGNOSIS — D0512 Intraductal carcinoma in situ of left breast: Secondary | ICD-10-CM | POA: Diagnosis not present

## 2020-06-03 ENCOUNTER — Other Ambulatory Visit: Payer: Self-pay | Admitting: Internal Medicine

## 2020-06-26 ENCOUNTER — Other Ambulatory Visit: Payer: Self-pay

## 2020-06-26 ENCOUNTER — Ambulatory Visit (INDEPENDENT_AMBULATORY_CARE_PROVIDER_SITE_OTHER): Payer: Medicare Other | Admitting: General Practice

## 2020-06-26 DIAGNOSIS — Z7901 Long term (current) use of anticoagulants: Secondary | ICD-10-CM | POA: Diagnosis not present

## 2020-06-26 DIAGNOSIS — D6859 Other primary thrombophilia: Secondary | ICD-10-CM

## 2020-06-26 DIAGNOSIS — Z86718 Personal history of other venous thrombosis and embolism: Secondary | ICD-10-CM

## 2020-06-26 DIAGNOSIS — I742 Embolism and thrombosis of arteries of the upper extremities: Secondary | ICD-10-CM

## 2020-06-26 LAB — POCT INR: INR: 2.5 (ref 2.0–3.0)

## 2020-06-26 NOTE — Progress Notes (Signed)
Medical screening examination/treatment/procedure(s) were performed by non-physician practitioner and as supervising physician I was immediately available for consultation/collaboration. I agree with above. Ascencion Coye, MD   

## 2020-06-26 NOTE — Patient Instructions (Signed)
Pre visit review using our clinic review tool, if applicable. No additional management support is needed unless otherwise documented below in the visit note.  Continue to take 1 1/2 tablets of the peach-colored warfarin tablets by mouth every day of the week.  Re-check in 6 weeks.   

## 2020-06-28 ENCOUNTER — Other Ambulatory Visit: Payer: Self-pay | Admitting: Internal Medicine

## 2020-07-09 DIAGNOSIS — Z6820 Body mass index (BMI) 20.0-20.9, adult: Secondary | ICD-10-CM | POA: Diagnosis not present

## 2020-07-09 DIAGNOSIS — M816 Localized osteoporosis [Lequesne]: Secondary | ICD-10-CM | POA: Diagnosis not present

## 2020-07-13 ENCOUNTER — Other Ambulatory Visit: Payer: Self-pay | Admitting: Obstetrics and Gynecology

## 2020-07-13 DIAGNOSIS — Z9889 Other specified postprocedural states: Secondary | ICD-10-CM

## 2020-07-19 ENCOUNTER — Telehealth: Payer: Self-pay | Admitting: Internal Medicine

## 2020-07-19 NOTE — Telephone Encounter (Signed)
Patient is having UTI symptoms and bad pain. The last time she was prescribed Cipro but she would prefer to use the Nitrofurantoin instead.   Please advise.   Preferred pharmacy:  [facility], [place] Phone:  [phone]  Fax:  [phone]

## 2020-07-20 MED ORDER — NITROFURANTOIN MONOHYD MACRO 100 MG PO CAPS: 100.0000 mg | ORAL_CAPSULE | Freq: Two times a day (BID) | ORAL | 0 refills | Status: AC

## 2020-07-20 NOTE — Telephone Encounter (Signed)
Called pt there was no answer LMOM w/MD response. Rx sent to [facility].Marland KitchenJohny Chess

## 2020-07-20 NOTE — Telephone Encounter (Signed)
Okay.  Thanks.

## 2020-07-27 ENCOUNTER — Other Ambulatory Visit: Payer: Self-pay | Admitting: Internal Medicine

## 2020-08-07 ENCOUNTER — Other Ambulatory Visit: Payer: Self-pay

## 2020-08-07 ENCOUNTER — Ambulatory Visit (INDEPENDENT_AMBULATORY_CARE_PROVIDER_SITE_OTHER): Payer: Medicare Other | Admitting: General Practice

## 2020-08-07 DIAGNOSIS — D6859 Other primary thrombophilia: Secondary | ICD-10-CM

## 2020-08-07 DIAGNOSIS — I742 Embolism and thrombosis of arteries of the upper extremities: Secondary | ICD-10-CM

## 2020-08-07 DIAGNOSIS — Z86718 Personal history of other venous thrombosis and embolism: Secondary | ICD-10-CM

## 2020-08-07 DIAGNOSIS — Z7901 Long term (current) use of anticoagulants: Secondary | ICD-10-CM

## 2020-08-07 LAB — POCT INR: INR: 2.1 (ref 2.0–3.0)

## 2020-08-07 NOTE — Patient Instructions (Addendum)
Pre visit review using our clinic review tool, if applicable. No additional management support is needed unless otherwise documented below in the visit note.  Continue to take 1 1/2 tablets of the peach-colored warfarin tablets by mouth every day of the week.  Re-check in 6 weeks.   

## 2020-08-09 ENCOUNTER — Other Ambulatory Visit: Payer: Self-pay | Admitting: Internal Medicine

## 2020-08-14 NOTE — Progress Notes (Signed)
Medical screening examination/treatment/procedure(s) were performed by non-physician practitioner and as supervising physician I was immediately available for consultation/collaboration. I agree with above. Myrta Mercer, MD  

## 2020-08-16 ENCOUNTER — Other Ambulatory Visit: Payer: Self-pay

## 2020-08-16 ENCOUNTER — Encounter: Payer: Self-pay | Admitting: Internal Medicine

## 2020-08-16 ENCOUNTER — Ambulatory Visit (INDEPENDENT_AMBULATORY_CARE_PROVIDER_SITE_OTHER): Payer: Medicare Other | Admitting: Internal Medicine

## 2020-08-16 VITALS — BP 184/86 | HR 79 | Temp 98.3°F | Ht 65.0 in | Wt 113.4 lb

## 2020-08-16 DIAGNOSIS — R531 Weakness: Secondary | ICD-10-CM

## 2020-08-16 DIAGNOSIS — R5382 Chronic fatigue, unspecified: Secondary | ICD-10-CM

## 2020-08-16 DIAGNOSIS — E871 Hypo-osmolality and hyponatremia: Secondary | ICD-10-CM

## 2020-08-16 DIAGNOSIS — D500 Iron deficiency anemia secondary to blood loss (chronic): Secondary | ICD-10-CM

## 2020-08-16 DIAGNOSIS — R110 Nausea: Secondary | ICD-10-CM

## 2020-08-16 LAB — COMPREHENSIVE METABOLIC PANEL
ALT: 12 U/L (ref 0–35)
AST: 17 U/L (ref 0–37)
Albumin: 4.1 g/dL (ref 3.5–5.2)
Alkaline Phosphatase: 43 U/L (ref 39–117)
BUN: 15 mg/dL (ref 6–23)
CO2: 27 mEq/L (ref 19–32)
Calcium: 9.4 mg/dL (ref 8.4–10.5)
Chloride: 95 mEq/L — ABNORMAL LOW (ref 96–112)
Creatinine, Ser: 0.83 mg/dL (ref 0.40–1.20)
GFR: 64.48 mL/min (ref >60.00)
Glucose, Bld: 88 mg/dL (ref 70–99)
Potassium: 4.3 mEq/L (ref 3.5–5.1)
Sodium: 127 mEq/L — ABNORMAL LOW (ref 135–145)
Total Bilirubin: 0.6 mg/dL (ref 0.2–1.2)
Total Protein: 7.2 g/dL (ref 6.0–8.3)

## 2020-08-16 LAB — HEPATIC FUNCTION PANEL
ALT: 12 U/L (ref 0–35)
AST: 17 U/L (ref 0–37)
Albumin: 4.1 g/dL (ref 3.5–5.2)
Alkaline Phosphatase: 43 U/L (ref 39–117)
Bilirubin, Direct: 0.1 mg/dL (ref 0.0–0.3)
Total Bilirubin: 0.6 mg/dL (ref 0.2–1.2)
Total Protein: 7.2 g/dL (ref 6.0–8.3)

## 2020-08-16 LAB — CBC WITH DIFFERENTIAL/PLATELET
Basophils Absolute: 0 10*3/uL (ref 0.0–0.1)
Basophils Relative: 0.6 % (ref 0.0–3.0)
Eosinophils Absolute: 0.1 10*3/uL (ref 0.0–0.7)
Eosinophils Relative: 1.1 % (ref 0.0–5.0)
HCT: 36 % (ref 36.0–46.0)
Hemoglobin: 12.3 g/dL (ref 12.0–15.0)
Lymphocytes Relative: 25.2 % (ref 12.0–46.0)
Lymphs Abs: 1.8 10*3/uL (ref 0.7–4.0)
MCHC: 34.2 g/dL (ref 30.0–36.0)
MCV: 105.7 fl — ABNORMAL HIGH (ref 78.0–100.0)
Monocytes Absolute: 0.7 10*3/uL (ref 0.1–1.0)
Monocytes Relative: 9.4 % (ref 3.0–12.0)
Neutro Abs: 4.5 10*3/uL (ref 1.4–7.7)
Neutrophils Relative %: 63.7 % (ref 43.0–77.0)
Platelets: 270 10*3/uL (ref 150.0–400.0)
RBC: 3.41 Mil/uL — ABNORMAL LOW (ref 3.87–5.11)
RDW: 13.7 % (ref 11.5–15.5)
WBC: 7.1 10*3/uL (ref 4.0–10.5)

## 2020-08-16 LAB — BASIC METABOLIC PANEL
BUN: 15 mg/dL (ref 6–23)
CO2: 27 mEq/L (ref 19–32)
Calcium: 9.4 mg/dL (ref 8.4–10.5)
Chloride: 95 mEq/L — ABNORMAL LOW (ref 96–112)
Creatinine, Ser: 0.83 mg/dL (ref 0.40–1.20)
GFR: 64.48 mL/min (ref >60.00)
Glucose, Bld: 88 mg/dL (ref 70–99)
Potassium: 4.3 mEq/L (ref 3.5–5.1)
Sodium: 127 mEq/L — ABNORMAL LOW (ref 135–145)

## 2020-08-16 LAB — URINALYSIS
Bilirubin Urine: NEGATIVE
Ketones, ur: NEGATIVE
Leukocytes,Ua: NEGATIVE
Nitrite: NEGATIVE
Specific Gravity, Urine: <=1.005 — AB (ref 1.000–1.030)
Total Protein, Urine: NEGATIVE
Urine Glucose: NEGATIVE
Urobilinogen, UA: 0.2 (ref 0.0–1.0)
pH: 6 (ref 5.0–8.0)

## 2020-08-16 LAB — LIPASE: Lipase: 21 U/L (ref 11.0–59.0)

## 2020-08-16 MED ORDER — VALACYCLOVIR HCL 1 G PO TABS: 1000.0000 mg | ORAL_TABLET | Freq: Every day | ORAL | 3 refills | Status: DC

## 2020-08-16 MED ORDER — METOPROLOL SUCCINATE ER 25 MG PO TB24: 25.0000 mg | ORAL_TABLET | Freq: Every day | ORAL | 3 refills | Status: DC

## 2020-08-16 MED ORDER — TRIAMCINOLONE ACETONIDE 0.5 % EX CREA: 1.0000 "application " | TOPICAL_CREAM | Freq: Three times a day (TID) | CUTANEOUS | 3 refills | Status: AC

## 2020-08-16 MED ORDER — ZOLPIDEM TARTRATE 10 MG PO TABS: ORAL_TABLET | ORAL | 1 refills | Status: DC

## 2020-08-16 NOTE — Progress Notes (Signed)
Subjective:  Patient ID: Tracy Peters, female    DOB: 25-Mar-1934  Age: 87 y.o. MRN: [id number]  CC: Dizziness (Pt states on Sun, Mon, and Tues was nauseated along with being fatigue)   HPI Tracy Peters presents for dizziness, nausea, fatigue on Sun and Mon Better now  F/u HTN, seizure disorder  Outpatient Medications Prior to Visit  Medication Sig Dispense Refill  . amLODipine (NORVASC) 2.5 MG tablet Take 1 tablet (2.5 mg total) by mouth daily. 90 tablet 3  . Cholecalciferol (VITAMIN D3) 1.25 MG (50000 UT) CAPS Take 1 capsule every 10 days 9 capsule 3  . diphenoxylate-atropine (LOMOTIL) 2.5-0.025 MG tablet TAKE 1-2 TABLETS 4 TIMES A DAY AS NEEDED FOR DIARRHEA OR LOOSE STOOLS. 120 tablet 0  . enalapril (VASOTEC) 10 MG tablet Take 10 mg by mouth daily.    Marland Kitchen EPINEPHrine 0.3 mg/0.3 mL IJ SOAJ injection USE AS DIRECTED (Patient taking differently: Inject 0.3 mg into the muscle as needed for anaphylaxis. ) 2 Device 1  . ferrous sulfate 325 (65 FE) MG tablet Take 1 tablet (325 mg total) by mouth every other day.    . losartan (COZAAR) 100 MG tablet TAKE 1 TABLET BY MOUTH DAILY. 90 tablet 3  . Probiotic Product (ALIGN PO) Take 1 capsule by mouth in the morning and at bedtime.     Marland Kitchen tobramycin-dexamethasone (TOBRADEX) ophthalmic solution Place 1 drop into both eyes at bedtime.    . traMADol (ULTRAM) 50 MG tablet TAKE 1-2 TABLETS TWICE A DAY AS NEEDED FOR SEVERE PAIN. 92 tablet 1  . VIMPAT 50 MG TABS tablet TAKE 1 TABLET BY MOUTH TWICE DAILY. 60 tablet 11  . warfarin (COUMADIN) 5 MG tablet Take 1 1/2 tablets daily or as directed by anticoagulation clinic. 135 tablet 1  . zolpidem (AMBIEN) 10 MG tablet 1 & 1/2 TABLETS (15MG ) BY MOUTH AT BEDTIME AS NEEDED FOR SLEEP, OK TO REPEAT 1&1/2 IN 4HRS 270 tablet 1  . azithromycin (ZITHROMAX) 250 MG tablet Take 1 tablet (250 mg total) by mouth daily. (Patient not taking: Reported on 08/16/2020) 4 tablet 0  . benzonatate (TESSALON) 100 MG  capsule Take 1-2 capsules (100-200 mg total) by mouth 3 (three) times daily as needed for cough. (Patient not taking: Reported on 08/16/2020) 60 capsule 0  . fluticasone (FLONASE) 50 MCG/ACT nasal spray Place 2 sprays into both nostrils daily. (Patient not taking: Reported on 08/16/2020) 48 g 3  . valACYclovir (VALTREX) 1000 MG tablet Take 1 tablet (1,000 mg total) by mouth 2 (two) times daily. (Patient not taking: Reported on 08/16/2020) 180 tablet 1   No facility-administered medications prior to visit.    ROS: Review of Systems  Constitutional: Negative for activity change, appetite change, chills, fatigue and unexpected weight change.  HENT: Negative for congestion, mouth sores and sinus pressure.   Eyes: Negative for visual disturbance.  Respiratory: Negative for cough and chest tightness.   Gastrointestinal: Negative for abdominal pain and nausea.  Genitourinary: Negative for difficulty urinating, frequency and vaginal pain.  Musculoskeletal: Negative for back pain and gait problem.  Skin: Negative for pallor and rash.  Neurological: Positive for weakness and light-headedness. Negative for dizziness, tremors, numbness and headaches.  Psychiatric/Behavioral: Negative for confusion and sleep disturbance.    Objective:  BP (!) 184/86 (BP Location: Left Arm)   Pulse 79   Temp 98.3 F (36.8 C) (Oral)   Ht 5\' 5"  (1.651 m)   Wt 113 lb 6.4 oz (51.4 kg)  SpO2 98%   BMI 18.87 kg/m   BP Readings from Last 3 Encounters:  08/16/20 (!) 184/86  02/28/20 (!) 180/92  01/16/20 (!) 152/90    Wt Readings from Last 3 Encounters:  08/16/20 113 lb 6.4 oz (51.4 kg)  02/28/20 122 lb (55.3 kg)  01/16/20 124 lb (56.2 kg)    Physical Exam Constitutional:      General: She is not in acute distress.    Appearance: She is well-developed.  HENT:     Head: Normocephalic.     Right Ear: External ear normal.     Left Ear: External ear normal.     Nose: Nose normal.     Mouth/Throat:     Mouth:  Oropharynx is clear and moist.  Eyes:     General:        Right eye: No discharge.        Left eye: No discharge.     Conjunctiva/sclera: Conjunctivae normal.     Pupils: Pupils are equal, round, and reactive to light.  Neck:     Thyroid: No thyromegaly.     Vascular: No JVD.     Trachea: No tracheal deviation.  Cardiovascular:     Rate and Rhythm: Normal rate and regular rhythm.     Heart sounds: Normal heart sounds.  Pulmonary:     Effort: No respiratory distress.     Breath sounds: No stridor. No wheezing.  Abdominal:     General: Bowel sounds are normal. There is no distension.     Palpations: Abdomen is soft. There is no mass.     Tenderness: There is no abdominal tenderness. There is no guarding or rebound.  Musculoskeletal:        General: No tenderness or edema.     Cervical back: Normal range of motion and neck supple.  Lymphadenopathy:     Cervical: No cervical adenopathy.  Skin:    Findings: No erythema or rash.  Neurological:     Mental Status: She is oriented to person, place, and time.     Cranial Nerves: No cranial nerve deficit.     Motor: Weakness present. No abnormal muscle tone.     Coordination: Coordination abnormal.     Gait: Gait abnormal.     Deep Tendon Reflexes: Reflexes normal.  Psychiatric:        Mood and Affect: Mood and affect normal.        Behavior: Behavior normal.        Thought Content: Thought content normal.        Judgment: Judgment normal.     Lab Results  Component Value Date   WBC 8.7 01/16/2020   HGB 12.2 01/16/2020   HCT 37.1 01/16/2020   PLT 303 01/16/2020   GLUCOSE 90 02/28/2020   CHOL 196 01/03/2016   TRIG 85.0 01/03/2016   HDL 63.10 01/03/2016   LDLDIRECT 129.8 05/31/2012   LDLCALC 116 (H) 01/03/2016   ALT 11 01/16/2020   AST 19 01/16/2020   NA 133 (L) 02/28/2020   K 4.6 02/28/2020   CL 97 (L) 02/28/2020   CREATININE 1.08 (H) 02/28/2020   BUN 15 02/28/2020   CO2 28 02/28/2020   TSH 4.99 (H) 01/16/2020    INR 2.1 08/07/2020    DG Chest 2 View  Result Date: 01/04/2020 CLINICAL DATA:  Productive cough and chest congestion since Sunday. EXAM: CHEST - 2 VIEW COMPARISON:  10/20/2017 FINDINGS: The cardiac silhouette, mediastinal and hilar contours are within normal  limits and stable. Mild tortuosity and calcification of the thoracic aorta. Mild underlying emphysematous and chronic bronchitic changes but no definite infiltrates, edema or effusions. The bony thorax is intact. IMPRESSION: Chronic lung changes but no acute pulmonary findings. Electronically Signed   By: Marijo Sanes M.D.   On: 01/04/2020 09:05    Assessment & Plan:   There are no diagnoses linked to this encounter.   No orders of the defined types were placed in this encounter.    Follow-up: No follow-ups on file.  [name], MD

## 2020-08-29 DIAGNOSIS — H04123 Dry eye syndrome of bilateral lacrimal glands: Secondary | ICD-10-CM | POA: Diagnosis not present

## 2020-08-29 DIAGNOSIS — H01001 Unspecified blepharitis right upper eyelid: Secondary | ICD-10-CM | POA: Diagnosis not present

## 2020-09-03 ENCOUNTER — Other Ambulatory Visit: Payer: Self-pay | Admitting: General Surgery

## 2020-09-03 DIAGNOSIS — Z9889 Other specified postprocedural states: Secondary | ICD-10-CM

## 2020-09-10 DIAGNOSIS — M7061 Trochanteric bursitis, right hip: Secondary | ICD-10-CM | POA: Diagnosis not present

## 2020-09-17 ENCOUNTER — Other Ambulatory Visit: Payer: Self-pay

## 2020-09-17 ENCOUNTER — Ambulatory Visit (INDEPENDENT_AMBULATORY_CARE_PROVIDER_SITE_OTHER): Payer: Medicare Other | Admitting: Internal Medicine

## 2020-09-17 ENCOUNTER — Encounter: Payer: Self-pay | Admitting: Internal Medicine

## 2020-09-17 DIAGNOSIS — M5440 Lumbago with sciatica, unspecified side: Secondary | ICD-10-CM | POA: Diagnosis not present

## 2020-09-17 DIAGNOSIS — G8929 Other chronic pain: Secondary | ICD-10-CM

## 2020-09-17 DIAGNOSIS — I10 Essential (primary) hypertension: Secondary | ICD-10-CM | POA: Diagnosis not present

## 2020-09-17 DIAGNOSIS — E785 Hyperlipidemia, unspecified: Secondary | ICD-10-CM | POA: Diagnosis not present

## 2020-09-17 DIAGNOSIS — W19XXXS Unspecified fall, sequela: Secondary | ICD-10-CM

## 2020-09-17 DIAGNOSIS — R531 Weakness: Secondary | ICD-10-CM

## 2020-09-17 DIAGNOSIS — Z86718 Personal history of other venous thrombosis and embolism: Secondary | ICD-10-CM

## 2020-09-17 DIAGNOSIS — R413 Other amnesia: Secondary | ICD-10-CM | POA: Diagnosis not present

## 2020-09-17 DIAGNOSIS — E871 Hypo-osmolality and hyponatremia: Secondary | ICD-10-CM

## 2020-09-17 DIAGNOSIS — R634 Abnormal weight loss: Secondary | ICD-10-CM | POA: Diagnosis not present

## 2020-09-17 DIAGNOSIS — W19XXXA Unspecified fall, initial encounter: Secondary | ICD-10-CM | POA: Insufficient documentation

## 2020-09-17 MED ORDER — CLONIDINE HCL 0.1 MG PO TABS: 0.1000 mg | ORAL_TABLET | Freq: Two times a day (BID) | ORAL | 3 refills | Status: DC

## 2020-09-17 NOTE — Assessment & Plan Note (Signed)
Overall stable memory No recent falls

## 2020-09-17 NOTE — Assessment & Plan Note (Addendum)
BP at home is 180/85 - uncontrolled.  Our choice of blood pressure meds is limited. Add Clonidine with caution.  Start with half a tablet if able to break it- warned re: fatigue, weakness, falls - d/c if issues

## 2020-09-17 NOTE — Assessment & Plan Note (Signed)
Discussed diet CMET, TSH

## 2020-09-17 NOTE — Assessment & Plan Note (Signed)
Check CMET. 

## 2020-09-17 NOTE — Assessment & Plan Note (Signed)
Try Welchol

## 2020-09-17 NOTE — Assessment & Plan Note (Signed)
On Coumadin 

## 2020-09-17 NOTE — Assessment & Plan Note (Addendum)
Overall stable memory better No recent falls

## 2020-09-17 NOTE — Assessment & Plan Note (Signed)
Tramadol prn - was tolerating ok. Stopped 3 mo ago due to to some nausea.   Potential benefits of a long term opioids use as well as potential risks (i.e. addiction risk, apnea etc) and complications (i.e. Somnolence, constipation and others) were explained to the patient and were aknowledged.

## 2020-09-17 NOTE — Assessment & Plan Note (Signed)
A little better 

## 2020-09-17 NOTE — Progress Notes (Signed)
Subjective:  Patient ID: Tracy Peters, female    DOB: 01-14-1934  Age: 87 y.o. MRN: [id number]  CC: Follow-up (Ongoing back pain)   HPI Tracy Peters presents for HTN, ataxia, LBP f/u   Overall stable memory No recent falls  Outpatient Medications Prior to Visit  Medication Sig Dispense Refill  . amLODipine (NORVASC) 2.5 MG tablet Take 1 tablet (2.5 mg total) by mouth daily. 90 tablet 3  . Cholecalciferol (VITAMIN D3) 1.25 MG (50000 UT) CAPS Take 1 capsule every 10 days 9 capsule 3  . diphenoxylate-atropine (LOMOTIL) 2.5-0.025 MG tablet TAKE 1-2 TABLETS 4 TIMES A DAY AS NEEDED FOR DIARRHEA OR LOOSE STOOLS. 120 tablet 0  . EPINEPHrine 0.3 mg/0.3 mL IJ SOAJ injection USE AS DIRECTED (Patient taking differently: Inject 0.3 mg into the muscle as needed for anaphylaxis.) 2 Device 1  . ferrous sulfate 325 (65 FE) MG tablet Take 1 tablet (325 mg total) by mouth every other day.    . losartan (COZAAR) 100 MG tablet TAKE 1 TABLET BY MOUTH DAILY. 90 tablet 3  . metoprolol succinate (TOPROL-XL) 25 MG 24 hr tablet Take 1 tablet (25 mg total) by mouth daily. 90 tablet 3  . Probiotic Product (ALIGN PO) Take 1 capsule by mouth in the morning and at bedtime.     Marland Kitchen tobramycin-dexamethasone (TOBRADEX) ophthalmic solution Place 1 drop into both eyes at bedtime.    . traMADol (ULTRAM) 50 MG tablet TAKE 1-2 TABLETS TWICE A DAY AS NEEDED FOR SEVERE PAIN. 92 tablet 1  . triamcinolone cream (KENALOG) 0.5 % Apply 1 application topically 3 (three) times daily. 45 g 3  . valACYclovir (VALTREX) 1000 MG tablet Take 1 tablet (1,000 mg total) by mouth daily. 90 tablet 3  . VIMPAT 50 MG TABS tablet TAKE 1 TABLET BY MOUTH TWICE DAILY. 60 tablet 11  . warfarin (COUMADIN) 5 MG tablet Take 1 1/2 tablets daily or as directed by anticoagulation clinic. 135 tablet 1  . zolpidem (AMBIEN) 10 MG tablet 1 & 1/2 TABLETS (15MG ) BY MOUTH AT BEDTIME AS NEEDED FOR SLEEP, OK TO REPEAT 1&1/2 IN 4HRS 270 tablet 1    No facility-administered medications prior to visit.    ROS: Review of Systems  Constitutional: Positive for fatigue. Negative for activity change, appetite change, chills and unexpected weight change.  HENT: Negative for congestion, mouth sores and sinus pressure.   Eyes: Negative for visual disturbance.  Respiratory: Negative for cough and chest tightness.   Gastrointestinal: Negative for abdominal pain and nausea.  Genitourinary: Negative for difficulty urinating, frequency and vaginal pain.  Musculoskeletal: Positive for arthralgias, back pain and gait problem.  Skin: Negative for pallor and rash.  Neurological: Positive for weakness. Negative for dizziness, tremors, numbness and headaches.  Psychiatric/Behavioral: Negative for confusion and sleep disturbance. The patient is nervous/anxious.     Objective:  BP (!) 182/82 (BP Location: Left Arm)   Pulse 64   Temp 98.1 F (36.7 C) (Oral)   SpO2 97%   BP Readings from Last 3 Encounters:  09/17/20 (!) 182/82  08/16/20 (!) 184/86  02/28/20 (!) 180/92    Wt Readings from Last 3 Encounters:  08/16/20 113 lb 6.4 oz (51.4 kg)  02/28/20 122 lb (55.3 kg)  01/16/20 124 lb (56.2 kg)    Physical Exam Constitutional:      General: She is not in acute distress.    Appearance: Normal appearance. She is well-developed.  HENT:     Head: Normocephalic.  Right Ear: External ear normal.     Left Ear: External ear normal.     Nose: Nose normal.  Eyes:     General:        Right eye: No discharge.        Left eye: No discharge.     Conjunctiva/sclera: Conjunctivae normal.     Pupils: Pupils are equal, round, and reactive to light.  Neck:     Thyroid: No thyromegaly.     Vascular: No JVD.     Trachea: No tracheal deviation.  Cardiovascular:     Rate and Rhythm: Normal rate and regular rhythm.     Heart sounds: Normal heart sounds.  Pulmonary:     Effort: No respiratory distress.     Breath sounds: No stridor. No wheezing.   Abdominal:     General: Bowel sounds are normal. There is no distension.     Palpations: Abdomen is soft. There is no mass.     Tenderness: There is no abdominal tenderness. There is no guarding or rebound.  Musculoskeletal:        General: Tenderness present.     Cervical back: Normal range of motion and neck supple.  Lymphadenopathy:     Cervical: No cervical adenopathy.  Skin:    Findings: No erythema or rash.  Neurological:     Mental Status: She is oriented to person, place, and time.     Cranial Nerves: No cranial nerve deficit.     Motor: No abnormal muscle tone.     Coordination: Coordination abnormal.     Gait: Gait abnormal.     Deep Tendon Reflexes: Reflexes normal.  Psychiatric:        Behavior: Behavior normal.        Thought Content: Thought content normal.        Judgment: Judgment normal.   Thin Slightly ataxic gait   Lab Results  Component Value Date   WBC 7.1 08/16/2020   HGB 12.3 08/16/2020   HCT 36.0 08/16/2020   PLT 270.0 08/16/2020   GLUCOSE 88 08/16/2020   GLUCOSE 88 08/16/2020   CHOL 196 01/03/2016   TRIG 85.0 01/03/2016   HDL 63.10 01/03/2016   LDLDIRECT 129.8 05/31/2012   LDLCALC 116 (H) 01/03/2016   ALT 12 08/16/2020   ALT 12 08/16/2020   AST 17 08/16/2020   AST 17 08/16/2020   NA 127 (L) 08/16/2020   NA 127 (L) 08/16/2020   K 4.3 08/16/2020   K 4.3 08/16/2020   CL 95 (L) 08/16/2020   CL 95 (L) 08/16/2020   CREATININE 0.83 08/16/2020   CREATININE 0.83 08/16/2020   BUN 15 08/16/2020   BUN 15 08/16/2020   CO2 27 08/16/2020   CO2 27 08/16/2020   TSH 4.99 (H) 01/16/2020   INR 2.1 08/07/2020    DG Chest 2 View  Result Date: 01/04/2020 CLINICAL DATA:  Productive cough and chest congestion since Sunday. EXAM: CHEST - 2 VIEW COMPARISON:  10/20/2017 FINDINGS: The cardiac silhouette, mediastinal and hilar contours are within normal limits and stable. Mild tortuosity and calcification of the thoracic aorta. Mild underlying emphysematous  and chronic bronchitic changes but no definite infiltrates, edema or effusions. The bony thorax is intact. IMPRESSION: Chronic lung changes but no acute pulmonary findings. Electronically Signed   By: Marijo Sanes M.D.   On: 01/04/2020 09:05    Assessment & Plan:     [name], MD

## 2020-09-18 ENCOUNTER — Ambulatory Visit (INDEPENDENT_AMBULATORY_CARE_PROVIDER_SITE_OTHER): Payer: Medicare Other | Admitting: General Practice

## 2020-09-18 DIAGNOSIS — Z86718 Personal history of other venous thrombosis and embolism: Secondary | ICD-10-CM | POA: Diagnosis not present

## 2020-09-18 DIAGNOSIS — I742 Embolism and thrombosis of arteries of the upper extremities: Secondary | ICD-10-CM | POA: Diagnosis not present

## 2020-09-18 DIAGNOSIS — D6859 Other primary thrombophilia: Secondary | ICD-10-CM | POA: Diagnosis not present

## 2020-09-18 DIAGNOSIS — Z7901 Long term (current) use of anticoagulants: Secondary | ICD-10-CM | POA: Diagnosis not present

## 2020-09-18 LAB — POCT INR: INR: 2.8 (ref 2.0–3.0)

## 2020-09-18 NOTE — Patient Instructions (Addendum)
Pre visit review using our clinic review tool, if applicable. No additional management support is needed unless otherwise documented below in the visit note.  Continue to take 1 1/2 tablets of the peach-colored warfarin tablets by mouth every day of the week.  Re-check in 6 weeks.

## 2020-09-18 NOTE — Progress Notes (Signed)
Medical screening examination/treatment/procedure(s) were performed by non-physician practitioner and as supervising physician I was immediately available for consultation/collaboration. I agree with above. Kamelia Lampkins, MD   

## 2020-09-25 ENCOUNTER — Ambulatory Visit
Admission: RE | Admit: 2020-09-25 | Discharge: 2020-09-25 | Disposition: A | Discharge status: Home / Self Care | Payer: Medicare Other | Source: Ambulatory Visit | Attending: General Surgery | Admitting: General Surgery

## 2020-09-25 ENCOUNTER — Other Ambulatory Visit: Payer: Self-pay

## 2020-09-25 DIAGNOSIS — Z9889 Other specified postprocedural states: Secondary | ICD-10-CM

## 2020-09-25 DIAGNOSIS — R922 Inconclusive mammogram: Secondary | ICD-10-CM | POA: Diagnosis not present

## 2020-09-25 DIAGNOSIS — Z853 Personal history of malignant neoplasm of breast: Secondary | ICD-10-CM | POA: Diagnosis not present

## 2020-10-08 ENCOUNTER — Other Ambulatory Visit: Payer: Self-pay | Admitting: Internal Medicine

## 2020-10-11 ENCOUNTER — Telehealth: Payer: Self-pay | Admitting: Internal Medicine

## 2020-10-11 NOTE — Telephone Encounter (Signed)
LVM for pt to rtn my call to schedule awv with nha. 

## 2020-10-24 ENCOUNTER — Other Ambulatory Visit: Payer: Self-pay | Admitting: Internal Medicine

## 2020-10-24 NOTE — Telephone Encounter (Signed)
Rx printed faxed manually to [facility].Marland KitchenJohny Chess

## 2020-10-30 ENCOUNTER — Ambulatory Visit (INDEPENDENT_AMBULATORY_CARE_PROVIDER_SITE_OTHER): Payer: Medicare Other | Admitting: General Practice

## 2020-10-30 ENCOUNTER — Other Ambulatory Visit: Payer: Self-pay

## 2020-10-30 ENCOUNTER — Ambulatory Visit (INDEPENDENT_AMBULATORY_CARE_PROVIDER_SITE_OTHER): Payer: Medicare Other

## 2020-10-30 ENCOUNTER — Other Ambulatory Visit: Payer: Self-pay | Admitting: Oncology

## 2020-10-30 VITALS — BP 150/80 | HR 82 | Temp 97.6°F | Ht 65.0 in | Wt 117.2 lb

## 2020-10-30 DIAGNOSIS — Z0000 Encounter for general adult medical examination without abnormal findings: Secondary | ICD-10-CM | POA: Diagnosis not present

## 2020-10-30 DIAGNOSIS — D6859 Other primary thrombophilia: Secondary | ICD-10-CM | POA: Diagnosis not present

## 2020-10-30 DIAGNOSIS — Z7901 Long term (current) use of anticoagulants: Secondary | ICD-10-CM | POA: Diagnosis not present

## 2020-10-30 DIAGNOSIS — Z86718 Personal history of other venous thrombosis and embolism: Secondary | ICD-10-CM

## 2020-10-30 DIAGNOSIS — I742 Embolism and thrombosis of arteries of the upper extremities: Secondary | ICD-10-CM

## 2020-10-30 LAB — POCT INR: INR: 3.8 — AB (ref 2.0–3.0)

## 2020-10-30 NOTE — Progress Notes (Signed)
Medical screening examination/treatment/procedure(s) were performed by non-physician practitioner and as supervising physician I was immediately available for consultation/collaboration. I agree with above. Dannilynn Gallina, MD   

## 2020-10-30 NOTE — Patient Instructions (Addendum)
Pre visit review using our clinic review tool, if applicable. No additional management support is needed unless otherwise documented below in the visit note.  Skip dosage of coumadin today (5/17) and then change dosage and take 1 1/2 tablets of the peach-colored warfarin tablets by mouth every day except take 1 tablet every Monday.  Re-check in 4 weeks.

## 2020-10-30 NOTE — Progress Notes (Signed)
Subjective:   Tracy Peters is a 87 y.o. female who presents for Medicare Annual (Subsequent) preventive examination.  Review of Systems    No ROS. Medicare Wellness Visit. Additional risk factors are reflected in social history. Cardiac Risk Factors include: advanced age (>39men, >58 women);hypertension     Objective:    Today's Vitals   10/30/20 1034  BP: (!) 150/80  Pulse: 82  Temp: 97.6 F (36.4 C)  SpO2: 97%  Weight: 117 lb 3.2 oz (53.2 kg)  Height: 5\' 5"  (1.651 m)  PainSc: 0-No pain   Body mass index is 19.5 kg/m.  Advanced Directives 10/30/2020 08/25/2018 08/09/2018 08/03/2018 07/30/2018 10/20/2017 08/23/2017  Does Patient Have a Medical Advance Directive? Yes Yes Yes Yes Yes Yes Yes  Type of Paramedic of [employer];Living will Living will [place];Living will [place];Living will [place];Living will [place];Living will Living will  Does patient want to make changes to medical advance directive? No - Patient declined - No - Patient declined No - Patient declined No - Patient declined No - Patient declined No - Patient declined  Copy of [place] in Chart? No - copy requested Yes - validated most recent copy scanned in chart (See row information) Yes - validated most recent copy scanned in chart (See row information) Yes - validated most recent copy scanned in chart (See row information) No - copy requested Yes -  Would patient like information on creating a medical advance directive? - - No - Patient declined No - Patient declined - - -    Current Medications (verified) Outpatient Encounter Medications as of 10/30/2020  Medication Sig  . amLODipine (NORVASC) 2.5 MG tablet Take 1 tablet (2.5 mg total) by mouth daily.  . Cholecalciferol (VITAMIN D3) 1.25 MG (50000 UT) CAPS Take 1 capsule every 10 days  . cloNIDine (CATAPRES) 0.1 MG tablet Take 1  tablet (0.1 mg total) by mouth 2 (two) times daily.  . diphenoxylate-atropine (LOMOTIL) 2.5-0.025 MG tablet TAKE 1-2 TABLETS 4 TIMES A DAY AS NEEDED FOR DIARRHEA OR LOOSE STOOLS.  Marland Kitchen EPINEPHrine 0.3 mg/0.3 mL IJ SOAJ injection USE AS DIRECTED (Patient taking differently: Inject 0.3 mg into the muscle as needed for anaphylaxis.)  . ferrous sulfate 325 (65 FE) MG tablet Take 1 tablet (325 mg total) by mouth every other day.  . losartan (COZAAR) 100 MG tablet TAKE 1 TABLET BY MOUTH DAILY.  . metoprolol succinate (TOPROL-XL) 25 MG 24 hr tablet Take 1 tablet (25 mg total) by mouth daily.  . Probiotic Product (ALIGN PO) Take 1 capsule by mouth in the morning and at bedtime.   Marland Kitchen tobramycin-dexamethasone (TOBRADEX) ophthalmic solution Place 1 drop into both eyes at bedtime.  . traMADol (ULTRAM) 50 MG tablet TAKE 1-2 TABLETS TWICE A DAY AS NEEDED FOR SEVERE PAIN.  Marland Kitchen triamcinolone cream (KENALOG) 0.5 % Apply 1 application topically 3 (three) times daily.  . valACYclovir (VALTREX) 1000 MG tablet Take 1 tablet (1,000 mg total) by mouth daily.  Marland Kitchen VIMPAT 50 MG TABS tablet TAKE 1 TABLET BY MOUTH TWICE DAILY.  Marland Kitchen warfarin (COUMADIN) 5 MG tablet Take 1 1/2 tablets daily or as directed by anticoagulation clinic.  Marland Kitchen zolpidem (AMBIEN) 10 MG tablet 1 & 1/2 TABLETS (15MG ) BY MOUTH AT BEDTIME AS NEEDED FOR SLEEP, OK TO REPEAT 1&1/2 IN 4HRS   No facility-administered encounter medications on file as of 10/30/2020.    Allergies (verified)  Bee venom, Ivp dye [iodinated diagnostic agents], Shellfish allergy, Temazepam, Amlodipine besylate, Aspirin, Atorvastatin, Cholestyramine, Codeine phosphate, Demerol [meperidine], Diphenhydramine hcl, Doxycycline, Gentamicin sulfate, Keppra [levetiracetam], Meclizine hcl, Meperidine hcl, Metronidazole, Moxifloxacin, Sulfamethoxazole, and Penicillins   History: Past Medical History:  Diagnosis Date  . Adenomatous colon polyp   . Allergic rhinitis    uses FLonase nightly  . Anemia    . Arthritis    joint pain  . Blood transfusion   . Bronchitis    hx of-7-63yrs ago  . Carpal tunnel syndrome    numbness and tingling   . Cataracts, bilateral   . Chronic anticoagulation 03/21/2013  . Congenital absence of one kidney    pt only has one kidney  . Diarrhea    takes Immodium daily as needed or Lomotil   . Diverticulosis   . GERD (gastroesophageal reflux disease)    mild  . Herpes    takes Valtrex daily  . History of colon polyps   . History of DVT (deep vein thrombosis) 1998   right arm  . History of staph infection   . Hyperlipidemia    Medical MD doesn't think its absolutely necessary  . Hypertension    takes Losartan daily  . IBS (irritable bowel syndrome)    takes Electronics engineer daily  . Insomnia    takes Ambien nightly as needed  . LBP (low back pain)   . Long term (current) use of anticoagulants    Dr. Beryle Beams  . Nocturia   . Osteoporosis   . Personal history of radiation therapy   . Raynaud's disease   . 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 Winfield Cunas  . Seizures ([place]) 10/20/2017   has not had a seizure since may 2019  . 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 Urinary leakage   . UTI (urinary tract infection)    Past Surgical History:  Procedure Laterality Date  .  kidney disease left     . ABDOMINAL HYSTERECTOMY    . ANTERIOR CERVICAL DECOMP/DISCECTOMY FUSION N/A 01/18/2014   Procedure: ANTERIOR CERVICAL DECOMPRESSION/DISCECTOMY FUSION 3 LEVELS  Cervical  four/five, five/six, six/seven anterior cervical decompression with fusion interbody prosthesis with plating and bonegraft;  Surgeon: Newman Pies, MD;  Location: [facility];  Service: Neurosurgery;  Laterality: N/A;  t  . APPENDECTOMY    . BACK SURGERY     2  . BREAST BIOPSY Left 30+ yrs ago   benign  . BREAST BIOPSY Left 07/09/2018  . BREAST LUMPECTOMY Left 08/06/2018  . BREAST LUMPECTOMY WITH RADIOACTIVE SEED LOCALIZATION  Left 08/06/2018   Procedure: LEFT BREAST LUMPECTOMY WITH RADIOACTIVE SEED LOCALIZATION;  Surgeon: Fanny Skates, MD;  Location: [facility];  Service: General;  Laterality: Left;  . BREAST SURGERY     cystectomy-benign  . CARPAL TUNNEL RELEASE Right 06/12/2014   Procedure: CARPAL TUNNEL RELEASE;  Surgeon: Newman Pies, MD;  Location: [facility];  Service: Neurosurgery;  Laterality: Right;  Right Carpal Tunnel Release  . CATARACT EXTRACTION Bilateral 02/15/2015  . CHOLECYSTECTOMY    . COLONOSCOPY    . [place]  . HIP CLOSED REDUCTION Right 08/23/2017   Procedure: CLOSED MANIPULATION HIP;  Surgeon: Nicholes Stairs, MD;  Location: [facility];  Service: Orthopedics;  Laterality: Right;  . I&D of abdomen  1988   couple of wks after gallbladder removed  . KNEE SURGERY  left x 3  . LUMBAR LAMINECTOMY     X 2  . mortons neuromas removed    . OPEN SURGICAL REPAIR OF GLUTEAL TENDON Right 06/15/2017   Procedure: OPEN SURGICAL REPAIR OF GLUTEALmedius TENDON;  Surgeon: Paralee Cancel, MD;  Location: [facility];  Service: Orthopedics;  Laterality: Right;  . SHOULDER SURGERY     Right  . TOTAL HIP ARTHROPLASTY Right 06/15/2017   Procedure: Right total hip arthroplasty, bursectomy, repair of gluteal tendon, posterior approach;  Surgeon: Paralee Cancel, MD;  Location: [facility];  Service: Orthopedics;  Laterality: Right;  90 mins for all procedures together   Family History  Problem Relation Age of Onset  . Breast cancer Sister   . Kidney disease Sister 80       nephritis  . Parkinsonism Mother   . Heart disease Brother   . Osteoarthritis Other   . Ulcerative colitis Sister   . Kidney disease Sister   . Breast cancer Sister   . Colon cancer Neg Hx    Social History   Socioeconomic History  . Marital status: Married    Spouse name: Not on file  . Number of children: 2  . Years of education: Not on file  . Highest education level: Not on file   Occupational History  . Occupation: Retired    Fish farm manager: RETIRED  Tobacco Use  . Smoking status: Never Smoker  . Smokeless tobacco: Never Used  Vaping Use  . Vaping Use: Never used  Substance and Sexual Activity  . Alcohol use: No    Alcohol/week: 0.0 standard drinks  . Drug use: No  . Sexual activity: Never  Other Topics Concern  . Not on file  Social History Narrative   Lives home with husband.  Education- graduate level.  Children 2.     Social Determinants of Health   Financial Resource Strain: Low Risk   . Difficulty of Paying Living Expenses: Not hard at all  Food Insecurity: No Food Insecurity  . Worried About Charity fundraiser in the Last Year: Never true  . Ran Out of Food in the Last Year: Never true  Transportation Needs: No Transportation Needs  . Lack of Transportation (Medical): No  . Lack of Transportation (Non-Medical): No  Physical Activity: Sufficiently Active  . Days of Exercise per Week: 5 days  . Minutes of Exercise per Session: 30 min  Stress: No Stress Concern Present  . Feeling of Stress : Not at all  Social Connections: Socially Integrated  . Frequency of Communication with Friends and Family: More than three times a week  . Frequency of Social Gatherings with Friends and Family: More than three times a week  . Attends Religious Services: More than 4 times per year  . Active Member of Clubs or Organizations: Yes  . Attends Archivist Meetings: More than 4 times per year  . Marital Status: Married    Tobacco Counseling Counseling given: Not Answered   Clinical Intake:  Pre-visit preparation completed: Yes  Pain : No/denies pain Pain Score: 0-No pain     BMI - recorded: 19.5 Nutritional Status: BMI of 19-24  Normal Nutritional Risks: None Diabetes: No  How often do you need to have someone help you when you read instructions, pamphlets, or other written materials from your doctor or pharmacy?: 1 - Never What is the last  grade level you completed in school?: Graduate Degree  Diabetic? no  Interpreter Needed?: No  Information entered by ::  Lisette Abu, LPN   Activities of Daily Living In your present state of health, do you have any difficulty performing the following activities: 10/30/2020  Hearing? N  Vision? N  Difficulty concentrating or making decisions? Y  Walking or climbing stairs? N  Dressing or bathing? N  Doing errands, shopping? N  Preparing Food and eating ? N  Using the Toilet? N  In the past six months, have you accidently leaked urine? Y  Do you have problems with loss of bowel control? N  Managing your Medications? N  Managing your Finances? N  Housekeeping or managing your Housekeeping? N  Some recent data might be hidden    Patient Care Team: Plotnikov, Evie Lacks, MD as PCP - General Justice Britain, MD (Orthopedic Surgery) Annia Belt, MD (Hematology and Oncology) Molli Posey, MD (Obstetrics and Gynecology) Lafayette Dragon, MD (Inactive) (Gastroenterology) Newman Pies, MD as Consulting Physician (Neurosurgery) Gaynelle Arabian, MD as Consulting Physician (Orthopedic Surgery) Paralee Cancel, MD as Consulting Physician (Orthopedic Surgery) Magrinat, Virgie Dad, MD as Consulting Physician (Oncology) Fanny Skates, MD as Consulting Physician (General Surgery) Woodroe Mode, MD as Consulting Physician (Obstetrics and Gynecology)  Indicate any recent Medical Services you may have received from other than [facility] providers in the past year (date may be approximate).     Assessment:   This is a routine wellness examination for [place].  Hearing/Vision screen No exam data present  Dietary issues and exercise activities discussed: Current Exercise Habits: Home exercise routine, Type of exercise: walking, Time (Minutes): 30, Frequency (Times/Week): 5, Weekly Exercise (Minutes/Week): 150, Intensity: Moderate, Exercise limited by: orthopedic  condition(s)  Goals Addressed            This Visit's Progress   . Patient Stated       To maintain my current health status by continuing to eat healthy, stay physically active and socially active.      Depression Screen PHQ 2/9 Scores 10/30/2020 01/16/2020 08/09/2018 05/27/2017 03/02/2017 08/18/2016 07/31/2015  PHQ - 2 Score 0 0 0 0 0 0 0    Fall Risk Fall Risk  10/30/2020 01/16/2020 08/09/2018 05/27/2017 03/02/2017  Falls in the past year? 0 0 0 Yes Yes  Number falls in past yr: 0 0 - 2 or more 2 or more  Injury with Fall? 0 0 - Yes Yes  Risk Factor Category  - - - High Fall Risk High Fall Risk  Risk for fall due to : No Fall Risks - - Impaired balance/gait;Impaired mobility Impaired balance/gait;History of fall(s);Other (Comment)  Risk for fall due to: Comment - - - - Also felled out of the bed.  Follow up Falls evaluation completed - Falls prevention discussed Falls prevention discussed Falls prevention discussed    FALL RISK PREVENTION PERTAINING TO THE HOME:  Any stairs in or around the home? No  If so, are there any without handrails? No  Home free of loose throw rugs in walkways, pet beds, electrical cords, etc? Yes  Adequate lighting in your home to reduce risk of falls? Yes   ASSISTIVE DEVICES UTILIZED TO PREVENT FALLS:  Life alert? No  Use of a cane, walker or w/c? No  Grab bars in the bathroom? Yes  Shower chair or bench in shower? Yes  Elevated toilet seat or a handicapped toilet? Yes   TIMED UP AND GO:  Was the test performed? No .  Length of time to ambulate 10 feet: 0 sec.   Gait steady and fast without use  of assistive device  Cognitive Function: Normal cognitive status assessed by direct observation by this Nurse Health Advisor. No abnormalities found.          Immunizations Immunization History  Administered Date(s) Administered  . Fluad Quad(high Dose 65+) 02/12/2019, 02/28/2020  . Influenza Split 02/25/2011, 04/06/2012  . Influenza Whole  03/27/2009, 04/24/2010  . Influenza, High Dose Seasonal PF 04/13/2013, 04/19/2015, 03/21/2016, 04/15/2017, 03/05/2018  . Influenza,inj,Quad PF,6+ Mos 03/16/2014  . Moderna SARS-COV2 Booster Vaccination 09/27/2020  . Moderna Sars-Covid-2 Vaccination 06/27/2019, 07/25/2019, 04/06/2020  . Pneumococcal Conjugate-13 06/29/2013  . Pneumococcal Polysaccharide-23 09/20/2009  . Td 04/25/2004, 07/13/2015  . Zoster 04/29/2007    TDAP status: Up to date  Flu Vaccine status: Up to date  Pneumococcal vaccine status: Up to date  Covid-19 vaccine status: Completed vaccines  Qualifies for Shingles Vaccine? Yes   Zostavax completed Yes   Shingrix Completed?: No.    Education has been provided regarding the importance of this vaccine. Patient has been advised to call insurance company to determine out of pocket expense if they have not yet received this vaccine. Advised may also receive vaccine at local pharmacy or Health Dept. Verbalized acceptance and understanding.  Screening Tests Health Maintenance  Topic Date Due  . DEXA SCAN  Never done  . INFLUENZA VACCINE  01/14/2021  . TETANUS/TDAP  07/12/2025  . COVID-19 Vaccine  Completed  . PNA vac Low Risk Adult  Completed  . HPV VACCINES  Aged Out    Health Maintenance  Health Maintenance Due  Topic Date Due  . DEXA SCAN  Never done    Colorectal cancer screening: No longer required.   Mammogram status: Completed 4/102/2022. Repeat every year  Bone density status: never done  Lung Cancer Screening: (Low Dose CT Chest recommended if Age 46-80 years, 30 pack-year currently smoking OR have quit w/in 15years.) does not qualify.   Lung Cancer Screening Referral: no  Additional Screening:  Hepatitis C Screening: does not qualify; Completed no  Vision Screening: Recommended annual ophthalmology exams for early detection of glaucoma and other disorders of the eye. Is the patient up to date with their annual eye exam?  Yes  Who is the  provider or what is the name of the office in which the patient attends annual eye exams? Katy Apo, MD. If pt is not established with a provider, would they like to be referred to a provider to establish care? No .   Dental Screening: Recommended annual dental exams for proper oral hygiene  Community Resource Referral / Chronic Care Management: CRR required this visit?  No   CCM required this visit?  No      Plan:     I have personally reviewed and noted the following in the patient's chart:   . Medical and social history . Use of alcohol, tobacco or illicit drugs  . Current medications and supplements including opioid prescriptions.  . Functional ability and status . Nutritional status . Physical activity . Advanced directives . List of other physicians . Hospitalizations, surgeries, and ER visits in previous 12 months . Vitals . Screenings to include cognitive, depression, and falls . Referrals and appointments  In addition, I have reviewed and discussed with patient certain preventive protocols, quality metrics, and best practice recommendations. A written personalized care plan for preventive services as well as general preventive health recommendations were provided to patient.     Sheral Flow, LPN   [id number]/62/[id number]   Nurse Notes: n/a

## 2020-10-30 NOTE — Patient Instructions (Signed)
Tracy Peters , Thank you for taking time to come for your Medicare Wellness Visit. I appreciate your ongoing commitment to your health goals. Please review the following plan we discussed and let me know if I can assist you in the future.   Screening recommendations/referrals: Colonoscopy: not a candidate for colon cancer screening due to age 87: 09/25/2020 Bone Density: never done Recommended yearly ophthalmology/optometry visit for glaucoma screening and checkup Recommended yearly dental visit for hygiene and checkup  Vaccinations: Influenza vaccine: 02/28/2020 Pneumococcal vaccine: 09/20/2009, 06/29/2013 Tdap vaccine: 07/13/2015; due every 10 years Shingles vaccine: completed Covid-19: 06/27/2019, 07/25/2019, 04/06/2020, 09/27/2020  Advanced directives: Please bring a copy of your health care power of attorney and living will to the office at your convenience.  Conditions/risks identified: Yes; Reviewed health maintenance screenings with patient today and relevant education, vaccines, and/or referrals were provided. Please continue to do your personal lifestyle choices by: daily care of teeth and gums, regular physical activity (goal should be 5 days a week for 30 minutes), eat a healthy diet, avoid tobacco and drug use, limiting any alcohol intake, taking a low-dose aspirin (if not allergic or have been advised by your provider otherwise) and taking vitamins and minerals as recommended by your provider. Continue doing brain stimulating activities (puzzles, reading, adult coloring books, staying active) to keep memory sharp. Continue to eat heart healthy diet (full of fruits, vegetables, whole grains, lean protein, water--limit salt, fat, and sugar intake) and increase physical activity as tolerated.  Next appointment: Please schedule your next Medicare Wellness Visit with your Nurse Health Advisor in 1 year by calling [phone].   Preventive Care 84 Years and Older, Female Preventive care  refers to lifestyle choices and visits with your health care provider that can promote health and wellness. What does preventive care include?  A yearly physical exam. This is also called an annual well check.  Dental exams once or twice a year.  Routine eye exams. Ask your health care provider how often you should have your eyes checked.  Personal lifestyle choices, including:  Daily care of your teeth and gums.  Regular physical activity.  Eating a healthy diet.  Avoiding tobacco and drug use.  Limiting alcohol use.  Practicing safe sex.  Taking low-dose aspirin every day.  Taking vitamin and mineral supplements as recommended by your health care provider. What happens during an annual well check? The services and screenings done by your health care provider during your annual well check will depend on your age, overall health, lifestyle risk factors, and family history of disease. Counseling  Your health care provider may ask you questions about your:  Alcohol use.  Tobacco use.  Drug use.  Emotional well-being.  Home and relationship well-being.  Sexual activity.  Eating habits.  History of falls.  Memory and ability to understand (cognition).  Work and work Statistician.  Reproductive health. Screening  You may have the following tests or measurements:  Height, weight, and BMI.  Blood pressure.  Lipid and cholesterol levels. These may be checked every 5 years, or more frequently if you are over 71 years old.  Skin check.  Lung cancer screening. You may have this screening every year starting at age 19 if you have a 30-pack-year history of smoking and currently smoke or have quit within the past 15 years.  Fecal occult blood test (FOBT) of the stool. You may have this test every year starting at age 58.  Flexible sigmoidoscopy or colonoscopy. You may have a  sigmoidoscopy every 5 years or a colonoscopy every 10 years starting at age  51.  Hepatitis C blood test.  Hepatitis B blood test.  Sexually transmitted disease (STD) testing.  Diabetes screening. This is done by checking your blood sugar (glucose) after you have not eaten for a while (fasting). You may have this done every 1-3 years.  Bone density scan. This is done to screen for osteoporosis. You may have this done starting at age 43.  Mammogram. This may be done every 1-2 years. Talk to your health care provider about how often you should have regular mammograms. Talk with your health care provider about your test results, treatment options, and if necessary, the need for more tests. Vaccines  Your health care provider may recommend certain vaccines, such as:  Influenza vaccine. This is recommended every year.  Tetanus, diphtheria, and acellular pertussis (Tdap, Td) vaccine. You may need a Td booster every 10 years.  Zoster vaccine. You may need this after age 39.  Pneumococcal 13-valent conjugate (PCV13) vaccine. One dose is recommended after age 53.  Pneumococcal polysaccharide (PPSV23) vaccine. One dose is recommended after age 75. Talk to your health care provider about which screenings and vaccines you need and how often you need them. This information is not intended to replace advice given to you by your health care provider. Make sure you discuss any questions you have with your health care provider. Document Released: 06/29/2015 Document Revised: 02/20/2016 Document Reviewed: 04/03/2015 Elsevier Interactive Patient Education  2017 [employer] Prevention in the Home Falls can cause injuries. They can happen to people of all ages. There are many things you can do to make your home safe and to help prevent falls. What can I do on the outside of my home?  Regularly fix the edges of walkways and driveways and fix any cracks.  Remove anything that might make you trip as you walk through a door, such as a raised step or threshold.  Trim  any bushes or trees on the path to your home.  Use bright outdoor lighting.  Clear any walking paths of anything that might make someone trip, such as rocks or tools.  Regularly check to see if handrails are loose or broken. Make sure that both sides of any steps have handrails.  Any raised decks and porches should have guardrails on the edges.  Have any leaves, snow, or ice cleared regularly.  Use sand or salt on walking paths during winter.  Clean up any spills in your garage right away. This includes oil or grease spills. What can I do in the bathroom?  Use night lights.  Install grab bars by the toilet and in the tub and shower. Do not use towel bars as grab bars.  Use non-skid mats or decals in the tub or shower.  If you need to sit down in the shower, use a plastic, non-slip stool.  Keep the floor dry. Clean up any water that spills on the floor as soon as it happens.  Remove soap buildup in the tub or shower regularly.  Attach bath mats securely with double-sided non-slip rug tape.  Do not have throw rugs and other things on the floor that can make you trip. What can I do in the bedroom?  Use night lights.  Make sure that you have a light by your bed that is easy to reach.  Do not use any sheets or blankets that are too big for your bed. They  should not hang down onto the floor.  Have a firm chair that has side arms. You can use this for support while you get dressed.  Do not have throw rugs and other things on the floor that can make you trip. What can I do in the kitchen?  Clean up any spills right away.  Avoid walking on wet floors.  Keep items that you use a lot in easy-to-reach places.  If you need to reach something above you, use a strong step stool that has a grab bar.  Keep electrical cords out of the way.  Do not use floor polish or wax that makes floors slippery. If you must use wax, use non-skid floor wax.  Do not have throw rugs and other  things on the floor that can make you trip. What can I do with my stairs?  Do not leave any items on the stairs.  Make sure that there are handrails on both sides of the stairs and use them. Fix handrails that are broken or loose. Make sure that handrails are as long as the stairways.  Check any carpeting to make sure that it is firmly attached to the stairs. Fix any carpet that is loose or worn.  Avoid having throw rugs at the top or bottom of the stairs. If you do have throw rugs, attach them to the floor with carpet tape.  Make sure that you have a light switch at the top of the stairs and the bottom of the stairs. If you do not have them, ask someone to add them for you. What else can I do to help prevent falls?  Wear shoes that:  Do not have high heels.  Have rubber bottoms.  Are comfortable and fit you well.  Are closed at the toe. Do not wear sandals.  If you use a stepladder:  Make sure that it is fully opened. Do not climb a closed stepladder.  Make sure that both sides of the stepladder are locked into place.  Ask someone to hold it for you, if possible.  Clearly mark and make sure that you can see:  Any grab bars or handrails.  First and last steps.  Where the edge of each step is.  Use tools that help you move around (mobility aids) if they are needed. These include:  Canes.  Walkers.  Scooters.  Crutches.  Turn on the lights when you go into a dark area. Replace any light bulbs as soon as they burn out.  Set up your furniture so you have a clear path. Avoid moving your furniture around.  If any of your floors are uneven, fix them.  If there are any pets around you, be aware of where they are.  Review your medicines with your doctor. Some medicines can make you feel dizzy. This can increase your chance of falling. Ask your doctor what other things that you can do to help prevent falls. This information is not intended to replace advice given to  you by your health care provider. Make sure you discuss any questions you have with your health care provider. Document Released: 03/29/2009 Document Revised: 11/08/2015 Document Reviewed: 07/07/2014 Elsevier Interactive Patient Education  2017 [employer].

## 2020-11-29 ENCOUNTER — Other Ambulatory Visit: Payer: Self-pay | Admitting: Internal Medicine

## 2020-12-04 ENCOUNTER — Other Ambulatory Visit: Payer: Self-pay

## 2020-12-04 ENCOUNTER — Ambulatory Visit (INDEPENDENT_AMBULATORY_CARE_PROVIDER_SITE_OTHER): Payer: Medicare Other | Admitting: General Practice

## 2020-12-04 DIAGNOSIS — D6859 Other primary thrombophilia: Secondary | ICD-10-CM

## 2020-12-04 DIAGNOSIS — Z7901 Long term (current) use of anticoagulants: Secondary | ICD-10-CM | POA: Diagnosis not present

## 2020-12-04 DIAGNOSIS — Z86718 Personal history of other venous thrombosis and embolism: Secondary | ICD-10-CM

## 2020-12-04 DIAGNOSIS — I742 Embolism and thrombosis of arteries of the upper extremities: Secondary | ICD-10-CM | POA: Diagnosis not present

## 2020-12-04 LAB — POCT INR: INR: 2.8 (ref 2.0–3.0)

## 2020-12-04 NOTE — Patient Instructions (Addendum)
Pre visit review using our clinic review tool, if applicable. No additional management support is needed unless otherwise documented below in the visit note.  Continue to take 1 1/2 tablets of the peach-colored warfarin tablets by mouth every day except take 1 tablet every Monday.  Re-check in 6 weeks.

## 2020-12-04 NOTE — Progress Notes (Signed)
Medical screening examination/treatment/procedure(s) were performed by non-physician practitioner and as supervising physician I was immediately available for consultation/collaboration. I agree with above. Logen Fowle, MD   

## 2020-12-13 ENCOUNTER — Other Ambulatory Visit: Payer: Self-pay

## 2020-12-13 ENCOUNTER — Encounter: Payer: Self-pay | Admitting: Internal Medicine

## 2020-12-13 ENCOUNTER — Ambulatory Visit (INDEPENDENT_AMBULATORY_CARE_PROVIDER_SITE_OTHER): Payer: Medicare Other | Admitting: Internal Medicine

## 2020-12-13 DIAGNOSIS — R634 Abnormal weight loss: Secondary | ICD-10-CM | POA: Diagnosis not present

## 2020-12-13 DIAGNOSIS — W19XXXS Unspecified fall, sequela: Secondary | ICD-10-CM

## 2020-12-13 DIAGNOSIS — M5440 Lumbago with sciatica, unspecified side: Secondary | ICD-10-CM | POA: Diagnosis not present

## 2020-12-13 DIAGNOSIS — R569 Unspecified convulsions: Secondary | ICD-10-CM

## 2020-12-13 DIAGNOSIS — I10 Essential (primary) hypertension: Secondary | ICD-10-CM

## 2020-12-13 DIAGNOSIS — G8929 Other chronic pain: Secondary | ICD-10-CM

## 2020-12-13 DIAGNOSIS — E871 Hypo-osmolality and hyponatremia: Secondary | ICD-10-CM

## 2020-12-13 MED ORDER — TRAMADOL HCL 50 MG PO TABS: ORAL_TABLET | ORAL | 1 refills | Status: DC

## 2020-12-13 MED ORDER — ZOLPIDEM TARTRATE 10 MG PO TABS: ORAL_TABLET | ORAL | 1 refills | Status: DC

## 2020-12-13 MED ORDER — WARFARIN SODIUM 5 MG PO TABS: ORAL_TABLET | ORAL | 1 refills | Status: DC

## 2020-12-13 MED ORDER — DIPHENOXYLATE-ATROPINE 2.5-0.025 MG PO TABS: ORAL_TABLET | ORAL | 0 refills | Status: DC

## 2020-12-13 MED ORDER — AMLODIPINE BESYLATE 2.5 MG PO TABS: 2.5000 mg | ORAL_TABLET | Freq: Every day | ORAL | 3 refills | Status: DC

## 2020-12-13 MED ORDER — BENZONATATE 100 MG PO CAPS: 100.0000 mg | ORAL_CAPSULE | Freq: Three times a day (TID) | ORAL | 1 refills | Status: DC

## 2020-12-13 NOTE — Progress Notes (Signed)
Subjective:  Patient ID: Tracy Peters, female    DOB: 1933-10-25  Age: 87 y.o. MRN: [id number]  CC: Follow-up (Requesting medication refills amlodipine, Lomotil, Tramadol, zolpidem, Warfarin and Benzonatate )   HPI Maryan Sivak presents for HTN, LBP C/o bruising - Bruise behind R knee. INR was 2.8. CBC was ok 3 mo ago C/o skin lesion - L cheek - Dr Allyson Sabal  Outpatient Medications Prior to Visit  Medication Sig Dispense Refill   Cholecalciferol (VITAMIN D3) 1.25 MG (50000 UT) CAPS Take 1 capsule every 10 days 9 capsule 3   cloNIDine (CATAPRES) 0.1 MG tablet Take 1 tablet (0.1 mg total) by mouth 2 (two) times daily. 180 tablet 3   EPINEPHrine 0.3 mg/0.3 mL IJ SOAJ injection USE AS DIRECTED (Patient taking differently: Inject 0.3 mg into the muscle as needed for anaphylaxis.) 2 Device 1   ferrous sulfate 325 (65 FE) MG tablet Take 1 tablet (325 mg total) by mouth every other day.     lacosamide (VIMPAT) 50 MG TABS tablet TAKE 1 TABLET BY MOUTH TWICE DAILY. 60 tablet 5   losartan (COZAAR) 100 MG tablet TAKE 1 TABLET BY MOUTH DAILY. 90 tablet 3   metoprolol succinate (TOPROL-XL) 25 MG 24 hr tablet Take 1 tablet (25 mg total) by mouth daily. 90 tablet 3   Probiotic Product (ALIGN PO) Take 1 capsule by mouth in the morning and at bedtime.      tobramycin-dexamethasone (TOBRADEX) ophthalmic solution Place 1 drop into both eyes at bedtime.     triamcinolone cream (KENALOG) 0.5 % Apply 1 application topically 3 (three) times daily. 45 g 3   valACYclovir (VALTREX) 1000 MG tablet Take 1 tablet (1,000 mg total) by mouth daily. 90 tablet 3   amLODipine (NORVASC) 2.5 MG tablet Take 1 tablet (2.5 mg total) by mouth daily. 90 tablet 3   diphenoxylate-atropine (LOMOTIL) 2.5-0.025 MG tablet TAKE 1-2 TABLETS 4 TIMES A DAY AS NEEDED FOR DIARRHEA OR LOOSE STOOLS. 120 tablet 0   traMADol (ULTRAM) 50 MG tablet TAKE 1-2 TABLETS TWICE A DAY AS NEEDED FOR SEVERE PAIN. 92 tablet 1   warfarin  (COUMADIN) 5 MG tablet Take 1 1/2 tablets daily or as directed by anticoagulation clinic. 135 tablet 1   zolpidem (AMBIEN) 10 MG tablet 1 & 1/2 TABLETS (15MG ) BY MOUTH AT BEDTIME AS NEEDED FOR SLEEP, OK TO REPEAT 1&1/2 IN 4HRS 270 tablet 1   No facility-administered medications prior to visit.    ROS: Review of Systems  Constitutional:  Positive for fatigue. Negative for activity change, appetite change, chills and unexpected weight change.  HENT:  Negative for congestion, mouth sores and sinus pressure.   Eyes:  Negative for visual disturbance.  Respiratory:  Negative for cough and chest tightness.   Gastrointestinal:  Negative for abdominal pain and nausea.  Genitourinary:  Negative for difficulty urinating, frequency and vaginal pain.  Musculoskeletal:  Positive for arthralgias and back pain. Negative for gait problem.  Skin:  Negative for pallor and rash.  Neurological:  Negative for dizziness, tremors, weakness, numbness and headaches.  Hematological:  Negative for adenopathy. Bruises/bleeds easily.  Psychiatric/Behavioral:  Negative for confusion and sleep disturbance.    Objective:  BP (!) 162/82 (BP Location: Left Arm)   Pulse 75   Temp 98.5 F (36.9 C) (Oral)   Ht 5\' 5"  (1.651 m)   Wt 114 lb 3.2 oz (51.8 kg)   SpO2 97%   BMI 19.00 kg/m   BP Readings from Last  3 Encounters:  12/13/20 (!) 162/82  10/30/20 (!) 150/80  09/17/20 (!) 182/82    Wt Readings from Last 3 Encounters:  12/13/20 114 lb 3.2 oz (51.8 kg)  10/30/20 117 lb 3.2 oz (53.2 kg)  08/16/20 113 lb 6.4 oz (51.4 kg)    Physical Exam Constitutional:      General: She is not in acute distress.    Appearance: Normal appearance. She is well-developed.  HENT:     Head: Normocephalic.     Right Ear: External ear normal.     Left Ear: External ear normal.     Nose: Nose normal.  Eyes:     General:        Right eye: No discharge.        Left eye: No discharge.     Conjunctiva/sclera: Conjunctivae  normal.     Pupils: Pupils are equal, round, and reactive to light.  Neck:     Thyroid: No thyromegaly.     Vascular: No JVD.     Trachea: No tracheal deviation.  Cardiovascular:     Rate and Rhythm: Normal rate and regular rhythm.     Heart sounds: Normal heart sounds.  Pulmonary:     Effort: No respiratory distress.     Breath sounds: No stridor. No wheezing.  Abdominal:     General: Bowel sounds are normal. There is no distension.     Palpations: Abdomen is soft. There is no mass.     Tenderness: There is no abdominal tenderness. There is no guarding or rebound.  Musculoskeletal:        General: Tenderness present.     Cervical back: Normal range of motion and neck supple. No rigidity.  Lymphadenopathy:     Cervical: No cervical adenopathy.  Skin:    Findings: Bruising present. No erythema or rash.  Neurological:     Mental Status: She is oriented to person, place, and time.     Cranial Nerves: No cranial nerve deficit.     Motor: No abnormal muscle tone.     Coordination: Coordination normal.     Gait: Gait abnormal.     Deep Tendon Reflexes: Reflexes normal.  Psychiatric:        Behavior: Behavior normal.        Thought Content: Thought content normal.        Judgment: Judgment normal.   Bruise behind R knee Ataxic a little SK on L cheek vs other  Lab Results  Component Value Date   WBC 7.1 08/16/2020   HGB 12.3 08/16/2020   HCT 36.0 08/16/2020   PLT 270.0 08/16/2020   GLUCOSE 88 08/16/2020   GLUCOSE 88 08/16/2020   CHOL 196 01/03/2016   TRIG 85.0 01/03/2016   HDL 63.10 01/03/2016   LDLDIRECT 129.8 05/31/2012   LDLCALC 116 (H) 01/03/2016   ALT 12 08/16/2020   ALT 12 08/16/2020   AST 17 08/16/2020   AST 17 08/16/2020   NA 127 (L) 08/16/2020   NA 127 (L) 08/16/2020   K 4.3 08/16/2020   K 4.3 08/16/2020   CL 95 (L) 08/16/2020   CL 95 (L) 08/16/2020   CREATININE 0.83 08/16/2020   CREATININE 0.83 08/16/2020   BUN 15 08/16/2020   BUN 15 08/16/2020    CO2 27 08/16/2020   CO2 27 08/16/2020   TSH 4.99 (H) 01/16/2020   INR 2.8 12/04/2020    MM DIAG BREAST TOMO BILATERAL  Result Date: 09/25/2020 CLINICAL DATA:  History of a  left lumpectomy for breast carcinoma performed in February 2020, treated with adjuvant radiation therapy. No current breast complaints. EXAM: DIGITAL DIAGNOSTIC BILATERAL MAMMOGRAM WITH TOMOSYNTHESIS AND CAD TECHNIQUE: Bilateral digital diagnostic mammography and breast tomosynthesis was performed. The images were evaluated with computer-aided detection. COMPARISON:  Previous exam(s). ACR Breast Density Category c: The breast tissue is heterogeneously dense, which may obscure small masses. FINDINGS: Post lumpectomy changes on the left are stable. There are no masses or areas of nonsurgical architectural distortion. There are no suspicious calcifications. IMPRESSION: 1. No evidence of new or recurrent breast carcinoma. 2. Benign post lumpectomy changes on the left. RECOMMENDATION: Screening mammogram in one year.(Code:SM-B-01Y) I have discussed the findings and recommendations with the patient. If applicable, a reminder letter will be sent to the patient regarding the next appointment. BI-RADS CATEGORY  2: Benign. Electronically Signed   By: Lajean Manes M.D.   On: 09/25/2020 11:47    Assessment & Plan:   There are no diagnoses linked to this encounter.   Meds ordered this encounter  Medications   benzonatate (TESSALON) 100 MG capsule    Sig: Take 1 capsule (100 mg total) by mouth every 8 (eight) hours.    Dispense:  21 capsule    Refill:  1   amLODipine (NORVASC) 2.5 MG tablet    Sig: Take 1 tablet (2.5 mg total) by mouth daily.    Dispense:  90 tablet    Refill:  3   diphenoxylate-atropine (LOMOTIL) 2.5-0.025 MG tablet    Sig: Take 1-2 tablets 4 times a day as needed for diarrhea or loose stools    Dispense:  120 tablet    Refill:  0   traMADol (ULTRAM) 50 MG tablet    Sig: TAKE 1-2 TABLETS TWICE A DAY AS NEEDED  FOR SEVERE PAIN.    Dispense:  90 tablet    Refill:  1   zolpidem (AMBIEN) 10 MG tablet    Sig: 1 & 1/2 TABLETS (15MG ) BY MOUTH AT BEDTIME AS NEEDED FOR SLEEP, OK TO REPEAT 1&1/2 IN 4HRS    Dispense:  270 tablet    Refill:  1   warfarin (COUMADIN) 5 MG tablet    Sig: Take 1 1/2 tablets daily or as directed by anticoagulation clinic.    Dispense:  135 tablet    Refill:  1    90 day     Follow-up: No follow-ups on file.  [name], MD

## 2020-12-13 NOTE — Assessment & Plan Note (Signed)
Chronic  Tramadol prn - was tolerating ok  Potential benefits of a long term opioids use as well as potential risks (i.e. addiction risk, apnea etc) and complications (i.e. Somnolence, constipation and others) were explained to the patient and were aknowledged.

## 2020-12-17 NOTE — Assessment & Plan Note (Signed)
Monitor lab work-c-Met

## 2020-12-17 NOTE — Assessment & Plan Note (Signed)
No relapse 

## 2020-12-17 NOTE — Assessment & Plan Note (Signed)
Continue with amlodipine, clonidine

## 2020-12-17 NOTE — Assessment & Plan Note (Signed)
Wt Readings from Last 3 Encounters:  12/13/20 114 lb 3.2 oz (51.8 kg)  10/30/20 117 lb 3.2 oz (53.2 kg)  08/16/20 113 lb 6.4 oz (51.4 kg)  Improve diet

## 2020-12-17 NOTE — Assessment & Plan Note (Signed)
Continue with Vimpat

## 2021-01-10 DIAGNOSIS — L570 Actinic keratosis: Secondary | ICD-10-CM | POA: Diagnosis not present

## 2021-01-10 DIAGNOSIS — D485 Neoplasm of uncertain behavior of skin: Secondary | ICD-10-CM | POA: Diagnosis not present

## 2021-01-10 DIAGNOSIS — C44329 Squamous cell carcinoma of skin of other parts of face: Secondary | ICD-10-CM | POA: Diagnosis not present

## 2021-01-10 DIAGNOSIS — L820 Inflamed seborrheic keratosis: Secondary | ICD-10-CM | POA: Diagnosis not present

## 2021-01-15 ENCOUNTER — Ambulatory Visit (INDEPENDENT_AMBULATORY_CARE_PROVIDER_SITE_OTHER): Payer: Medicare Other | Admitting: General Practice

## 2021-01-15 ENCOUNTER — Other Ambulatory Visit: Payer: Self-pay

## 2021-01-15 DIAGNOSIS — Z7901 Long term (current) use of anticoagulants: Secondary | ICD-10-CM

## 2021-01-15 DIAGNOSIS — Z86718 Personal history of other venous thrombosis and embolism: Secondary | ICD-10-CM | POA: Diagnosis not present

## 2021-01-15 DIAGNOSIS — D6859 Other primary thrombophilia: Secondary | ICD-10-CM

## 2021-01-15 DIAGNOSIS — I742 Embolism and thrombosis of arteries of the upper extremities: Secondary | ICD-10-CM | POA: Diagnosis not present

## 2021-01-15 LAB — POCT INR: INR: 3.6 — AB (ref 2.0–3.0)

## 2021-01-15 NOTE — Patient Instructions (Signed)
Pre visit review using our clinic review tool, if applicable. No additional management support is needed unless otherwise documented below in the visit note.  Skip dosage today (8/2) and then continue to take 1 1/2 tablets of the peach-colored warfarin tablets by mouth every day except take 1 tablet every Monday.  Re-check in 4 to 6 weeks.

## 2021-01-16 NOTE — Progress Notes (Signed)
Medical screening examination/treatment/procedure(s) were performed by non-physician practitioner and as supervising physician I was immediately available for consultation/collaboration. I agree with above. Simuel Stebner, MD   

## 2021-01-18 DIAGNOSIS — M7061 Trochanteric bursitis, right hip: Secondary | ICD-10-CM | POA: Diagnosis not present

## 2021-01-18 DIAGNOSIS — Z96641 Presence of right artificial hip joint: Secondary | ICD-10-CM | POA: Diagnosis not present

## 2021-02-07 ENCOUNTER — Other Ambulatory Visit: Payer: Self-pay | Admitting: Internal Medicine

## 2021-02-08 ENCOUNTER — Other Ambulatory Visit: Payer: Self-pay

## 2021-02-11 ENCOUNTER — Other Ambulatory Visit: Payer: Self-pay | Admitting: Internal Medicine

## 2021-02-11 MED ORDER — DIPHENOXYLATE-ATROPINE 2.5-0.025 MG PO TABS: ORAL_TABLET | ORAL | 0 refills | Status: DC

## 2021-02-19 ENCOUNTER — Other Ambulatory Visit: Payer: Self-pay

## 2021-02-19 ENCOUNTER — Ambulatory Visit (INDEPENDENT_AMBULATORY_CARE_PROVIDER_SITE_OTHER): Payer: Medicare Other

## 2021-02-19 DIAGNOSIS — Z7901 Long term (current) use of anticoagulants: Secondary | ICD-10-CM

## 2021-02-19 LAB — POCT INR: INR: 1.9 — AB (ref 2.0–3.0)

## 2021-02-19 NOTE — Patient Instructions (Addendum)
Pre visit review using our clinic review tool, if applicable. No additional management support is needed unless otherwise documented below in the visit note.  Increase dose tomorrow to 2 tablets if bleeding is under control after dermatology procedure. Then continue to take 1 1/2 tablets of the peach-colored warfarin tablets by mouth every day except take 1 tablet every Monday.  Re-check in 4 weeks.

## 2021-02-19 NOTE — Progress Notes (Signed)
Medical screening examination/treatment/procedure(s) were performed by non-physician practitioner and as supervising physician I was immediately available for consultation/collaboration. I agree with above. Helem Reesor, MD   

## 2021-02-20 DIAGNOSIS — C44329 Squamous cell carcinoma of skin of other parts of face: Secondary | ICD-10-CM | POA: Diagnosis not present

## 2021-03-19 ENCOUNTER — Ambulatory Visit (INDEPENDENT_AMBULATORY_CARE_PROVIDER_SITE_OTHER): Payer: Medicare Other

## 2021-03-19 ENCOUNTER — Other Ambulatory Visit: Payer: Self-pay

## 2021-03-19 ENCOUNTER — Ambulatory Visit (INDEPENDENT_AMBULATORY_CARE_PROVIDER_SITE_OTHER): Payer: Medicare Other | Admitting: Internal Medicine

## 2021-03-19 ENCOUNTER — Encounter: Payer: Self-pay | Admitting: Internal Medicine

## 2021-03-19 VITALS — BP 190/82 | HR 79 | Temp 98.5°F | Ht 65.0 in | Wt 117.8 lb

## 2021-03-19 DIAGNOSIS — Z23 Encounter for immunization: Secondary | ICD-10-CM

## 2021-03-19 DIAGNOSIS — E871 Hypo-osmolality and hyponatremia: Secondary | ICD-10-CM

## 2021-03-19 DIAGNOSIS — C4490 Unspecified malignant neoplasm of skin, unspecified: Secondary | ICD-10-CM | POA: Diagnosis not present

## 2021-03-19 DIAGNOSIS — I10 Essential (primary) hypertension: Secondary | ICD-10-CM

## 2021-03-19 DIAGNOSIS — W19XXXS Unspecified fall, sequela: Secondary | ICD-10-CM

## 2021-03-19 DIAGNOSIS — G8929 Other chronic pain: Secondary | ICD-10-CM | POA: Diagnosis not present

## 2021-03-19 DIAGNOSIS — M5440 Lumbago with sciatica, unspecified side: Secondary | ICD-10-CM

## 2021-03-19 DIAGNOSIS — R413 Other amnesia: Secondary | ICD-10-CM

## 2021-03-19 DIAGNOSIS — Z7901 Long term (current) use of anticoagulants: Secondary | ICD-10-CM | POA: Diagnosis not present

## 2021-03-19 LAB — CBC WITH DIFFERENTIAL/PLATELET
Basophils Absolute: 0 10*3/uL (ref 0.0–0.1)
Basophils Relative: 0.3 % (ref 0.0–3.0)
Eosinophils Absolute: 0.1 10*3/uL (ref 0.0–0.7)
Eosinophils Relative: 0.6 % (ref 0.0–5.0)
HCT: 37.2 % (ref 36.0–46.0)
Hemoglobin: 12.5 g/dL (ref 12.0–15.0)
Lymphocytes Relative: 19.8 % (ref 12.0–46.0)
Lymphs Abs: 1.8 10*3/uL (ref 0.7–4.0)
MCHC: 33.5 g/dL (ref 30.0–36.0)
MCV: 102.6 fl — ABNORMAL HIGH (ref 78.0–100.0)
Monocytes Absolute: 0.7 10*3/uL (ref 0.1–1.0)
Monocytes Relative: 8.3 % (ref 3.0–12.0)
Neutro Abs: 6.3 10*3/uL (ref 1.4–7.7)
Neutrophils Relative %: 71 % (ref 43.0–77.0)
Platelets: 241 10*3/uL (ref 150.0–400.0)
RBC: 3.62 Mil/uL — ABNORMAL LOW (ref 3.87–5.11)
RDW: 14 % (ref 11.5–15.5)
WBC: 8.9 10*3/uL (ref 4.0–10.5)

## 2021-03-19 LAB — COMPREHENSIVE METABOLIC PANEL
ALT: 19 U/L (ref 0–35)
AST: 25 U/L (ref 0–37)
Albumin: 4.2 g/dL (ref 3.5–5.2)
Alkaline Phosphatase: 41 U/L (ref 39–117)
BUN: 24 mg/dL — ABNORMAL HIGH (ref 6–23)
CO2: 24 mEq/L (ref 19–32)
Calcium: 9.5 mg/dL (ref 8.4–10.5)
Chloride: 101 mEq/L (ref 96–112)
Creatinine, Ser: 0.95 mg/dL (ref 0.40–1.20)
GFR: 54.61 mL/min — ABNORMAL LOW (ref >60.00)
Glucose, Bld: 86 mg/dL (ref 70–99)
Potassium: 5 mEq/L (ref 3.5–5.1)
Sodium: 134 mEq/L — ABNORMAL LOW (ref 135–145)
Total Bilirubin: 0.7 mg/dL (ref 0.2–1.2)
Total Protein: 7.3 g/dL (ref 6.0–8.3)

## 2021-03-19 LAB — POCT INR: INR: 2.5 (ref 2.0–3.0)

## 2021-03-19 LAB — TSH: TSH: 2.14 u[IU]/mL (ref 0.35–5.50)

## 2021-03-19 MED ORDER — TRAMADOL HCL 50 MG PO TABS: ORAL_TABLET | ORAL | 1 refills | Status: DC

## 2021-03-19 MED ORDER — METOPROLOL SUCCINATE ER 25 MG PO TB24: 25.0000 mg | ORAL_TABLET | Freq: Every day | ORAL | 3 refills | Status: DC

## 2021-03-19 NOTE — Assessment & Plan Note (Signed)
Will check a CMET

## 2021-03-19 NOTE — Progress Notes (Signed)
Subjective:  Patient ID: Tracy Peters, female    DOB: June 29, 1933  Age: 87 y.o. MRN: [id number]  CC: Follow-up (3 month f/u- flu shot)   HPI [facility] presents for LBP, hyponatremia, anticoagulation, seizure No falls  Outpatient Medications Prior to Visit  Medication Sig Dispense Refill   amLODipine (NORVASC) 2.5 MG tablet Take 1 tablet (2.5 mg total) by mouth daily. 90 tablet 3   benzonatate (TESSALON) 100 MG capsule Take 1 capsule (100 mg total) by mouth every 8 (eight) hours. 21 capsule 1   Cholecalciferol (VITAMIN D3) 1.25 MG (50000 UT) CAPS Take 1 capsule every 10 days 9 capsule 3   cloNIDine (CATAPRES) 0.1 MG tablet Take 1 tablet (0.1 mg total) by mouth 2 (two) times daily. 180 tablet 3   diphenoxylate-atropine (LOMOTIL) 2.5-0.025 MG tablet 1 po qid prn 120 tablet 0   EPINEPHrine 0.3 mg/0.3 mL IJ SOAJ injection USE AS DIRECTED (Patient taking differently: Inject 0.3 mg into the muscle as needed for anaphylaxis.) 2 Device 1   ferrous sulfate 325 (65 FE) MG tablet Take 1 tablet (325 mg total) by mouth every other day.     lacosamide (VIMPAT) 50 MG TABS tablet TAKE 1 TABLET BY MOUTH TWICE DAILY. 60 tablet 5   losartan (COZAAR) 100 MG tablet TAKE 1 TABLET BY MOUTH DAILY. 90 tablet 3   metoprolol succinate (TOPROL-XL) 25 MG 24 hr tablet Take 1 tablet (25 mg total) by mouth daily. 90 tablet 3   Probiotic Product (ALIGN PO) Take 1 capsule by mouth in the morning and at bedtime.      tobramycin-dexamethasone (TOBRADEX) ophthalmic solution Place 1 drop into both eyes at bedtime.     traMADol (ULTRAM) 50 MG tablet TAKE 1-2 TABLETS TWICE A DAY AS NEEDED FOR SEVERE PAIN. 90 tablet 1   triamcinolone cream (KENALOG) 0.5 % Apply 1 application topically 3 (three) times daily. 45 g 3   valACYclovir (VALTREX) 1000 MG tablet Take 1 tablet (1,000 mg total) by mouth daily. 90 tablet 3   warfarin (COUMADIN) 5 MG tablet Take 1 1/2 tablets daily or as directed by anticoagulation  clinic. 135 tablet 1   zolpidem (AMBIEN) 10 MG tablet 1 & 1/2 TABLETS (15MG ) BY MOUTH AT BEDTIME AS NEEDED FOR SLEEP, OK TO REPEAT 1&1/2 IN 4HRS 270 tablet 1   No facility-administered medications prior to visit.    ROS: Review of Systems  Constitutional:  Positive for fatigue. Negative for activity change, appetite change, chills and unexpected weight change.  HENT:  Negative for congestion, mouth sores and sinus pressure.   Eyes:  Negative for visual disturbance.  Respiratory:  Negative for cough and chest tightness.   Gastrointestinal:  Negative for abdominal pain and nausea.  Genitourinary:  Negative for difficulty urinating, frequency and vaginal pain.  Musculoskeletal:  Positive for arthralgias, back pain and gait problem.  Skin:  Negative for pallor and rash.  Neurological:  Negative for dizziness, tremors, weakness, numbness and headaches.  Psychiatric/Behavioral:  Positive for decreased concentration. Negative for confusion, sleep disturbance and suicidal ideas. The patient is nervous/anxious.    Objective:  BP (!) 190/82 (BP Location: Left Arm)   Pulse 79   Temp 98.5 F (36.9 C) (Oral)   Ht 5\' 5"  (1.651 m)   Wt 117 lb 12.8 oz (53.4 kg)   SpO2 97%   BMI 19.60 kg/m   BP Readings from Last 3 Encounters:  03/19/21 (!) 190/82  12/13/20 (!) 162/82  10/30/20 (!) 150/80  Wt Readings from Last 3 Encounters:  03/19/21 117 lb 12.8 oz (53.4 kg)  12/13/20 114 lb 3.2 oz (51.8 kg)  10/30/20 117 lb 3.2 oz (53.2 kg)    Physical Exam Constitutional:      General: She is not in acute distress.    Appearance: Normal appearance. She is well-developed.  HENT:     Head: Normocephalic.     Right Ear: External ear normal.     Left Ear: External ear normal.     Nose: Nose normal.  Eyes:     General:        Right eye: No discharge.        Left eye: No discharge.     Conjunctiva/sclera: Conjunctivae normal.     Pupils: Pupils are equal, round, and reactive to light.  Neck:      Thyroid: No thyromegaly.     Vascular: No JVD.     Trachea: No tracheal deviation.  Cardiovascular:     Rate and Rhythm: Normal rate and regular rhythm.     Heart sounds: Normal heart sounds.  Pulmonary:     Effort: No respiratory distress.     Breath sounds: No stridor. No wheezing.  Abdominal:     General: Bowel sounds are normal. There is no distension.     Palpations: Abdomen is soft. There is no mass.     Tenderness: There is no abdominal tenderness. There is no guarding or rebound.  Musculoskeletal:        General: Tenderness present.     Cervical back: Normal range of motion and neck supple. No rigidity.  Lymphadenopathy:     Cervical: No cervical adenopathy.  Skin:    Findings: No erythema or rash.  Neurological:     Mental Status: She is oriented to person, place, and time.     Cranial Nerves: No cranial nerve deficit.     Motor: No abnormal muscle tone.     Coordination: Coordination abnormal.     Gait: Gait abnormal.     Deep Tendon Reflexes: Reflexes normal.  Psychiatric:        Behavior: Behavior normal.        Thought Content: Thought content normal.        Judgment: Judgment normal.  A little ataxic  Lab Results  Component Value Date   WBC 7.1 08/16/2020   HGB 12.3 08/16/2020   HCT 36.0 08/16/2020   PLT 270.0 08/16/2020   GLUCOSE 88 08/16/2020   GLUCOSE 88 08/16/2020   CHOL 196 01/03/2016   TRIG 85.0 01/03/2016   HDL 63.10 01/03/2016   LDLDIRECT 129.8 05/31/2012   LDLCALC 116 (H) 01/03/2016   ALT 12 08/16/2020   ALT 12 08/16/2020   AST 17 08/16/2020   AST 17 08/16/2020   NA 127 (L) 08/16/2020   NA 127 (L) 08/16/2020   K 4.3 08/16/2020   K 4.3 08/16/2020   CL 95 (L) 08/16/2020   CL 95 (L) 08/16/2020   CREATININE 0.83 08/16/2020   CREATININE 0.83 08/16/2020   BUN 15 08/16/2020   BUN 15 08/16/2020   CO2 27 08/16/2020   CO2 27 08/16/2020   TSH 4.99 (H) 01/16/2020   INR 2.5 03/19/2021    MM DIAG BREAST TOMO BILATERAL  Result Date:  09/25/2020 CLINICAL DATA:  History of a left lumpectomy for breast carcinoma performed in February 2020, treated with adjuvant radiation therapy. No current breast complaints. EXAM: DIGITAL DIAGNOSTIC BILATERAL MAMMOGRAM WITH TOMOSYNTHESIS AND CAD TECHNIQUE: Bilateral digital  diagnostic mammography and breast tomosynthesis was performed. The images were evaluated with computer-aided detection. COMPARISON:  Previous exam(s). ACR Breast Density Category c: The breast tissue is heterogeneously dense, which may obscure small masses. FINDINGS: Post lumpectomy changes on the left are stable. There are no masses or areas of nonsurgical architectural distortion. There are no suspicious calcifications. IMPRESSION: 1. No evidence of new or recurrent breast carcinoma. 2. Benign post lumpectomy changes on the left. RECOMMENDATION: Screening mammogram in one year.(Code:SM-B-01Y) I have discussed the findings and recommendations with the patient. If applicable, a reminder letter will be sent to the patient regarding the next appointment. BI-RADS CATEGORY  2: Benign. Electronically Signed   By: Lajean Manes M.D.   On: 09/25/2020 11:47    Assessment & Plan:   Problem List Items Addressed This Visit     Essential hypertension - Primary    Continue on Losartan, Norvasc      Relevant Orders   CBC with Differential/Platelet   Comprehensive metabolic panel   TSH   Falls    No relapse      Hyponatremia    Will check a CMET      Relevant Orders   CBC with Differential/Platelet   Comprehensive metabolic panel   TSH   LOW BACK PAIN    Continue on Tramadol prn - was tolerating ok  Potential benefits of a long term opioids use as well as potential risks (i.e. addiction risk, apnea etc) and complications (i.e. Somnolence, constipation and others) were explained to the patient and were aknowledged.      Relevant Orders   CBC with Differential/Platelet   Memory difficulties    Stable Minor issues      Skin  cancer    R forehead      Other Visit Diagnoses     Needs flu shot       Relevant Orders   Flu Vaccine QUAD High Dose(Fluad) (Completed)         Follow-up: Return in about 3 months (around 06/19/2021) for a follow-up visit.  [name], MD

## 2021-03-19 NOTE — Assessment & Plan Note (Signed)
No relapse 

## 2021-03-19 NOTE — Assessment & Plan Note (Signed)
Continue on Tramadol prn - was tolerating ok  Potential benefits of a long term opioids use as well as potential risks (i.e. addiction risk, apnea etc) and complications (i.e. Somnolence, constipation and others) were explained to the patient and were aknowledged.

## 2021-03-19 NOTE — Addendum Note (Signed)
Addended by: Jacobo Forest on: 03/19/2021 02:18 PM   Modules accepted: Orders

## 2021-03-19 NOTE — Assessment & Plan Note (Signed)
R forehead

## 2021-03-19 NOTE — Patient Instructions (Addendum)
Pre visit review using our clinic review tool, if applicable. No additional management support is needed unless otherwise documented below in the visit note.  Continue to take 1 1/2 tablets of the peach-colored warfarin tablets by mouth every day except take 1 tablet every Monday.  Re-check in 6 weeks.

## 2021-03-19 NOTE — Assessment & Plan Note (Signed)
Stable Minor issues

## 2021-03-19 NOTE — Assessment & Plan Note (Addendum)
  Continue on Losartan, Norvasc. Clonidine prn high BP 

## 2021-03-22 DIAGNOSIS — Z08 Encounter for follow-up examination after completed treatment for malignant neoplasm: Secondary | ICD-10-CM | POA: Diagnosis not present

## 2021-03-22 DIAGNOSIS — C4491 Basal cell carcinoma of skin, unspecified: Secondary | ICD-10-CM | POA: Diagnosis not present

## 2021-03-22 DIAGNOSIS — C4492 Squamous cell carcinoma of skin, unspecified: Secondary | ICD-10-CM | POA: Diagnosis not present

## 2021-03-22 DIAGNOSIS — L905 Scar conditions and fibrosis of skin: Secondary | ICD-10-CM | POA: Diagnosis not present

## 2021-03-22 DIAGNOSIS — Z85828 Personal history of other malignant neoplasm of skin: Secondary | ICD-10-CM | POA: Diagnosis not present

## 2021-04-12 DIAGNOSIS — Z86007 Personal history of in-situ neoplasm of skin: Secondary | ICD-10-CM | POA: Diagnosis not present

## 2021-04-12 DIAGNOSIS — C44329 Squamous cell carcinoma of skin of other parts of face: Secondary | ICD-10-CM | POA: Diagnosis not present

## 2021-04-12 DIAGNOSIS — D1801 Hemangioma of skin and subcutaneous tissue: Secondary | ICD-10-CM | POA: Diagnosis not present

## 2021-04-12 DIAGNOSIS — L814 Other melanin hyperpigmentation: Secondary | ICD-10-CM | POA: Diagnosis not present

## 2021-04-12 DIAGNOSIS — D225 Melanocytic nevi of trunk: Secondary | ICD-10-CM | POA: Diagnosis not present

## 2021-04-12 DIAGNOSIS — L821 Other seborrheic keratosis: Secondary | ICD-10-CM | POA: Diagnosis not present

## 2021-04-12 DIAGNOSIS — D485 Neoplasm of uncertain behavior of skin: Secondary | ICD-10-CM | POA: Diagnosis not present

## 2021-04-12 DIAGNOSIS — L570 Actinic keratosis: Secondary | ICD-10-CM | POA: Diagnosis not present

## 2021-04-12 DIAGNOSIS — Z08 Encounter for follow-up examination after completed treatment for malignant neoplasm: Secondary | ICD-10-CM | POA: Diagnosis not present

## 2021-04-12 DIAGNOSIS — Z85828 Personal history of other malignant neoplasm of skin: Secondary | ICD-10-CM | POA: Diagnosis not present

## 2021-04-29 DIAGNOSIS — M7061 Trochanteric bursitis, right hip: Secondary | ICD-10-CM | POA: Diagnosis not present

## 2021-04-30 ENCOUNTER — Ambulatory Visit (INDEPENDENT_AMBULATORY_CARE_PROVIDER_SITE_OTHER): Payer: Medicare Other

## 2021-04-30 ENCOUNTER — Other Ambulatory Visit: Payer: Self-pay

## 2021-04-30 DIAGNOSIS — Z7901 Long term (current) use of anticoagulants: Secondary | ICD-10-CM | POA: Diagnosis not present

## 2021-04-30 LAB — POCT INR: INR: 2.3 (ref 2.0–3.0)

## 2021-04-30 NOTE — Progress Notes (Addendum)
Continue to take 1 1/2 tablets of the peach-colored warfarin tablets by mouth every day except take 1 tablet every Monday.  Re-check in 8 weeks   Medical screening examination/treatment/procedure(s) were performed by non-physician practitioner and as supervising physician I was immediately available for consultation/collaboration.  I agree with above. Lew Dawes, MD

## 2021-04-30 NOTE — Patient Instructions (Addendum)
Pre visit review using our clinic review tool, if applicable. No additional management support is needed unless otherwise documented below in the visit note.  Continue to take 1 1/2 tablets of the peach-colored warfarin tablets by mouth every day except take 1 tablet every Monday.  Re-check in 8 weeks

## 2021-05-11 ENCOUNTER — Encounter: Payer: Self-pay | Admitting: Internal Medicine

## 2021-05-17 ENCOUNTER — Emergency Department (HOSPITAL_COMMUNITY)
Admission: EM | Admit: 2021-05-17 | Discharge: 2021-05-18 | Disposition: A | Discharge status: Home / Self Care | Payer: Medicare Other | Attending: Emergency Medicine | Admitting: Emergency Medicine

## 2021-05-17 ENCOUNTER — Encounter (HOSPITAL_COMMUNITY): Payer: Self-pay

## 2021-05-17 ENCOUNTER — Emergency Department (HOSPITAL_COMMUNITY): Payer: Medicare Other

## 2021-05-17 ENCOUNTER — Other Ambulatory Visit: Payer: Self-pay

## 2021-05-17 DIAGNOSIS — I719 Aortic aneurysm of unspecified site, without rupture: Secondary | ICD-10-CM | POA: Insufficient documentation

## 2021-05-17 DIAGNOSIS — Z7901 Long term (current) use of anticoagulants: Secondary | ICD-10-CM | POA: Insufficient documentation

## 2021-05-17 DIAGNOSIS — R0602 Shortness of breath: Secondary | ICD-10-CM | POA: Diagnosis not present

## 2021-05-17 DIAGNOSIS — I7122 Aneurysm of the aortic arch, without rupture: Secondary | ICD-10-CM

## 2021-05-17 DIAGNOSIS — Z79899 Other long term (current) drug therapy: Secondary | ICD-10-CM | POA: Insufficient documentation

## 2021-05-17 DIAGNOSIS — R072 Precordial pain: Secondary | ICD-10-CM | POA: Diagnosis present

## 2021-05-17 DIAGNOSIS — Z20822 Contact with and (suspected) exposure to covid-19: Secondary | ICD-10-CM | POA: Insufficient documentation

## 2021-05-17 DIAGNOSIS — I10 Essential (primary) hypertension: Secondary | ICD-10-CM | POA: Insufficient documentation

## 2021-05-17 DIAGNOSIS — Z96641 Presence of right artificial hip joint: Secondary | ICD-10-CM | POA: Diagnosis not present

## 2021-05-17 DIAGNOSIS — I712 Thoracic aortic aneurysm, without rupture, unspecified: Secondary | ICD-10-CM | POA: Diagnosis not present

## 2021-05-17 DIAGNOSIS — R079 Chest pain, unspecified: Secondary | ICD-10-CM | POA: Diagnosis not present

## 2021-05-17 DIAGNOSIS — R0789 Other chest pain: Secondary | ICD-10-CM | POA: Diagnosis not present

## 2021-05-17 LAB — BASIC METABOLIC PANEL
Anion gap: 7 (ref 5–15)
BUN: 19 mg/dL (ref 8–23)
CO2: 23 mmol/L (ref 22–32)
Calcium: 8.8 mg/dL — ABNORMAL LOW (ref 8.9–10.3)
Chloride: 99 mmol/L (ref 98–111)
Creatinine, Ser: 0.95 mg/dL (ref 0.44–1.00)
GFR, Estimated: 59 mL/min — ABNORMAL LOW (ref >60)
Glucose, Bld: 88 mg/dL (ref 70–99)
Potassium: 4.5 mmol/L (ref 3.5–5.1)
Sodium: 129 mmol/L — ABNORMAL LOW (ref 135–145)

## 2021-05-17 LAB — CBC
HCT: 36.3 % (ref 36.0–46.0)
Hemoglobin: 12.1 g/dL (ref 12.0–15.0)
MCH: 34.4 pg — ABNORMAL HIGH (ref 26.0–34.0)
MCHC: 33.3 g/dL (ref 30.0–36.0)
MCV: 103.1 fL — ABNORMAL HIGH (ref 80.0–100.0)
Platelets: 244 10*3/uL (ref 150–400)
RBC: 3.52 MIL/uL — ABNORMAL LOW (ref 3.87–5.11)
RDW: 13.8 % (ref 11.5–15.5)
WBC: 6.1 10*3/uL (ref 4.0–10.5)
nRBC: 0 % (ref 0.0–0.2)

## 2021-05-17 LAB — PROTIME-INR
INR: 2.5 — ABNORMAL HIGH (ref 0.8–1.2)
Prothrombin Time: 27.3 seconds — ABNORMAL HIGH (ref 11.4–15.2)

## 2021-05-17 LAB — RESP PANEL BY RT-PCR (FLU A&B, COVID) ARPGX2
Influenza A by PCR: NEGATIVE
Influenza B by PCR: NEGATIVE
SARS Coronavirus 2 by RT PCR: NEGATIVE

## 2021-05-17 LAB — TROPONIN I (HIGH SENSITIVITY): Troponin I (High Sensitivity): 10 ng/L (ref <18)

## 2021-05-17 MED ORDER — LIDOCAINE VISCOUS HCL 2 % MT SOLN
15.0000 mL | Freq: Once | OROMUCOSAL | Status: AC
  Administered 2021-05-17: 15 mL via ORAL
  Filled 2021-05-17: qty 15

## 2021-05-17 MED ORDER — ALUM & MAG HYDROXIDE-SIMETH 200-200-20 MG/5ML PO SUSP
30.0000 mL | Freq: Once | ORAL | Status: AC
  Administered 2021-05-17: 30 mL via ORAL
  Filled 2021-05-17: qty 30

## 2021-05-17 MED ORDER — MORPHINE SULFATE (PF) 4 MG/ML IV SOLN
4.0000 mg | Freq: Once | INTRAVENOUS | Status: AC
  Administered 2021-05-17: 4 mg via INTRAVENOUS
  Filled 2021-05-17: qty 1

## 2021-05-17 MED ORDER — ONDANSETRON HCL 4 MG/2ML IJ SOLN
4.0000 mg | Freq: Once | INTRAMUSCULAR | Status: AC
  Administered 2021-05-17: 4 mg via INTRAVENOUS
  Filled 2021-05-17: qty 2

## 2021-05-17 MED ORDER — AMLODIPINE BESYLATE 5 MG PO TABS
2.5000 mg | ORAL_TABLET | Freq: Once | ORAL | Status: AC
  Administered 2021-05-17: 2.5 mg via ORAL
  Filled 2021-05-17: qty 1

## 2021-05-17 MED ORDER — CLONIDINE HCL 0.1 MG PO TABS
0.1000 mg | ORAL_TABLET | Freq: Once | ORAL | Status: AC
  Administered 2021-05-17: 0.1 mg via ORAL
  Filled 2021-05-17: qty 1

## 2021-05-17 NOTE — ED Provider Notes (Signed)
Emergency Medicine Provider Triage Evaluation Note  Tracy Peters , a 87 y.o. female  was evaluated in triage.  Pt complains of chest pain.  Pt's MD advised her to come in   Review of Systems  Positive: Short of breath and ankle swelling  Negative: fever  Physical Exam  BP (!) 191/98 (BP Location: Right Arm)   Pulse 66   Temp 97.9 F (36.6 C) (Oral)   Resp 18   Ht 5\' 4"  (1.626 m)   Wt 50.3 kg   SpO2 98%   BMI 19.05 kg/m  Gen:   Awake, no distress   Resp:  Normal effort  MSK:   Moves extremities without difficulty  Other:    Medical Decision Making  Medically screening exam initiated at 1:25 PM.  Appropriate orders placed.  Tracy Peters was informed that the remainder of the evaluation will be completed by another provider, this initial triage assessment does not replace that evaluation, and the importance of remaining in the ED until their evaluation is complete.     Tracy Peters, [place] 05/17/21 1327    Tracy Pence, MD 05/17/21 [phone]

## 2021-05-17 NOTE — ED Triage Notes (Signed)
Patient c/o intermittent chest pain since 04/29/21. Patient also c/o intermittent SOB and at times occurs with the chest pain.

## 2021-05-17 NOTE — ED Provider Notes (Addendum)
[facility] DEPT Provider Note   CSN: [id number] Arrival date & time: 05/17/21  1248     History Chief Complaint  Patient presents with   Chest Pain    Tracy Peters is a 87 y.o. female.  The history is provided by the patient and medical records. No language interpreter was used.  Chest Pain  87 year old female significant history of hypertension, DVT currently on Coumadin, seizures, GERD, hyperlipidemia who presents complaining of chest pain.  Patient report for more than 2 weeks she has had recurrent midsternal chest pain.  She described pain as an uncomfortable sensation, waxing and waning, usually worsening with taking a deep breath.  Currently rates the pain as 5 out of 10.  She denies any associated fever chills lightheadedness dizziness diaphoresis nausea vomiting productive cough hemoptysis abdominal pain dysuria rash.  She denies any recent injury.  She tries taking heartburn medication without relief.  She is on chronic warfarin and has been compliant with her medication.  She did not have an opportunity to take her blood pressure medication today  Past Medical History:  Diagnosis Date   Adenomatous colon polyp    Allergic rhinitis    uses FLonase nightly   Anemia    Arthritis    joint pain   Blood transfusion    Bronchitis    hx of-7-63yr ago   Carpal tunnel syndrome    numbness and tingling    Cataracts, bilateral    Chronic anticoagulation 03/21/2013   Congenital absence of one kidney    pt only has one kidney   Diarrhea    takes Immodium daily as needed or Lomotil    Diverticulosis    GERD (gastroesophageal reflux disease)    mild   Herpes    takes Valtrex daily   History of colon polyps    History of DVT (deep vein thrombosis) 1998   right arm   History of staph infection    Hyperlipidemia    Medical MD doesn't think its absolutely necessary   Hypertension    takes Losartan daily   IBS (irritable bowel  syndrome)    takes Align daily   Insomnia    takes Ambien nightly as needed   LBP (low back pain)    Long term (current) use of anticoagulants    Dr. GBeryle Beams  Nocturia    Osteoporosis    Personal history of radiation therapy    Raynaud's disease    Sarcoma of left lower extremity ([facility]    Dr. 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/2011   Right digital artery 1998 idiopathic    Urinary leakage    UTI (urinary tract infection)     Patient Active Problem List   Diagnosis Date Noted   Skin cancer 03/19/2021   Falls 09/17/2020   Memory difficulties 02/28/2020   Upper respiratory infection 01/16/2020   Cellulitis 02/22/2019   Hearing loss 08/26/2018   Ductal carcinoma in situ (DCIS) of left breast 08/03/2018   LFT elevation 12/22/2017   Weakness 12/16/2017   Hyponatremia 12/16/2017   Seizure ([facility] 10/20/2017   Dislocation of internal right hip prosthesis ([facility] 08/23/2017   Anemia, iron deficiency 07/29/2017   S/P right THA, PA 06/15/2017   Right bursectomy 06/01/2017   Preop exam for internal medicine 05/20/2017   Cerumen impaction 01/27/2017   Actinic keratosis 01/27/2017   Primary hypercoagulable  [facility]) [D68.59] 11/17/2016   Long term (current) use of anticoagulants [Z79.01] 11/17/2016   Osteopenia 07/14/2016   Right-sided chest wall pain 04/17/2016   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   Dysuria 06/15/2015   Cervical spondylosis with myelopathy and radiculopathy 01/18/2014   Cervical radiculitis 12/28/2013   Left knee pain 12/28/2013   Paronychia 12/28/2013   Chronic anticoagulation 03/21/2013   Well adult exam 06/02/2012   Weight loss 10/01/2011   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[place]) 09/16/2011   Urticaria 05/28/2011   DIVERTICULOSIS, COLON 04/30/2010   COLONIC POLYPS, ADENOMATOUS, HX OF 04/30/2010   Dyslipidemia 09/20/2009   VERTIGO 09/20/2009    ALLERGIC RHINITIS 03/27/2009   INSOMNIA, PERSISTENT 09/20/2008   PARESTHESIA 09/20/2008   RASH AND OTHER NONSPECIFIC SKIN ERUPTION 03/22/2008   ACUTE BRONCHITIS 02/22/2008   Nonspecific (abnormal) findings on radiological and other examination of body structure 02/22/2008   CHEST XRAY, ABNORMAL 02/22/2008   GERD 09/21/2007   Essential hypertension 03/23/2007   LOW BACK PAIN 03/23/2007   DVT, HX OF 03/23/2007   Diarrhea 03/18/2007    Past Surgical History:  Procedure Laterality Date    kidney disease left      ABDOMINAL HYSTERECTOMY     ANTERIOR CERVICAL DECOMP/DISCECTOMY FUSION N/A 01/18/2014   Procedure: ANTERIOR CERVICAL DECOMPRESSION/DISCECTOMY FUSION 3 LEVELS  Cervical  four/five, five/six, six/seven anterior cervical decompression with fusion interbody prosthesis with plating and bonegraft;  Surgeon: Newman Pies, MD;  Location: [facility];  Service: Neurosurgery;  Laterality: N/A;  t   APPENDECTOMY     BACK SURGERY     2   BREAST BIOPSY Left 30+ yrs ago   benign   BREAST BIOPSY Left 07/09/2018   BREAST LUMPECTOMY Left 08/06/2018   BREAST LUMPECTOMY WITH RADIOACTIVE SEED LOCALIZATION Left 08/06/2018   Procedure: LEFT BREAST LUMPECTOMY WITH RADIOACTIVE SEED LOCALIZATION;  Surgeon: Fanny Skates, MD;  Location: [facility];  Service: General;  Laterality: Left;   BREAST SURGERY     cystectomy-benign   CARPAL TUNNEL RELEASE Right 06/12/2014   Procedure: CARPAL TUNNEL RELEASE;  Surgeon: Newman Pies, MD;  Location: [facility];  Service: Neurosurgery;  Laterality: Right;  Right Carpal Tunnel Release   CATARACT EXTRACTION Bilateral 02/15/2015   CHOLECYSTECTOMY     COLONOSCOPY     ECTOPIC PREGNANCY SURGERY  1959   HIP CLOSED REDUCTION Right 08/23/2017   Procedure: CLOSED MANIPULATION HIP;  Surgeon: Nicholes Stairs, MD;  Location: [facility];  Service: Orthopedics;  Laterality: Right;   I&D of abdomen  1988   couple of wks after gallbladder removed   KNEE  SURGERY     left x 3   LUMBAR LAMINECTOMY     X 2   mortons neuromas removed     OPEN SURGICAL REPAIR OF GLUTEAL TENDON Right 06/15/2017   Procedure: OPEN SURGICAL REPAIR OF GLUTEALmedius TENDON;  Surgeon: Paralee Cancel, MD;  Location: [facility];  Service: Orthopedics;  Laterality: Right;   SHOULDER SURGERY     Right   TOTAL HIP ARTHROPLASTY Right 06/15/2017   Procedure: Right total hip arthroplasty, bursectomy, repair of gluteal tendon, posterior approach;  Surgeon: Paralee Cancel, MD;  Location: [facility];  Service: Orthopedics;  Laterality: Right;  90 mins for all procedures together     OB History   No obstetric history on file.     Family History  Problem Relation Age of Onset   Breast cancer Sister    Kidney disease Sister 37  nephritis   Parkinsonism Mother    Heart disease Brother    Osteoarthritis Other    Ulcerative colitis Sister    Kidney disease Sister    Breast cancer Sister    Colon cancer Neg Hx     Social History   Tobacco Use   Smoking status: Never   Smokeless tobacco: Never  Vaping Use   Vaping Use: Never used  Substance Use Topics   Alcohol use: No    Alcohol/week: 0.0 standard drinks   Drug use: No    Home Medications Prior to Admission medications   Medication Sig Start Date End Date Taking? Authorizing Provider  amLODipine (NORVASC) 2.5 MG tablet Take 1 tablet (2.5 mg total) by mouth daily. 12/13/20   Plotnikov, Evie Lacks, MD  benzonatate (TESSALON) 100 MG capsule Take 1 capsule (100 mg total) by mouth every 8 (eight) hours. 12/13/20   Plotnikov, Evie Lacks, MD  Cholecalciferol (VITAMIN D3) 1.25 MG (50000 UT) CAPS Take 1 capsule every 10 days 01/17/20   Plotnikov, Evie Lacks, MD  cloNIDine (CATAPRES) 0.1 MG tablet Take 1 tablet (0.1 mg total) by mouth 2 (two) times daily. 09/17/20   Plotnikov, Evie Lacks, MD  diphenoxylate-atropine (LOMOTIL) 2.5-0.025 MG tablet 1 po qid prn 02/11/21   Plotnikov, Evie Lacks, MD  EPINEPHrine 0.3 mg/0.3 mL IJ SOAJ  injection USE AS DIRECTED Patient taking differently: Inject 0.3 mg into the muscle as needed for anaphylaxis. 10/12/18   Plotnikov, Evie Lacks, MD  ferrous sulfate 325 (65 FE) MG tablet Take 1 tablet (325 mg total) by mouth every other day. 06/15/19   Plotnikov, Evie Lacks, MD  lacosamide (VIMPAT) 50 MG TABS tablet TAKE 1 TABLET BY MOUTH TWICE DAILY. 11/30/20   Plotnikov, Evie Lacks, MD  losartan (COZAAR) 100 MG tablet TAKE 1 TABLET BY MOUTH DAILY. 06/03/20   Plotnikov, Evie Lacks, MD  metoprolol succinate (TOPROL-XL) 25 MG 24 hr tablet Take 1 tablet (25 mg total) by mouth daily. 03/19/21   Plotnikov, Evie Lacks, MD  Probiotic Product (ALIGN PO) Take 1 capsule by mouth in the morning and at bedtime.     [provider]  tobramycin-dexamethasone [facility]) ophthalmic solution Place 1 drop into both eyes at bedtime. 10/06/19   [provider]  traMADol (ULTRAM) 50 MG tablet TAKE 1-2 TABLETS TWICE A DAY AS NEEDED FOR SEVERE PAIN. 03/19/21   Plotnikov, Evie Lacks, MD  triamcinolone cream (KENALOG) 0.5 % Apply 1 application topically 3 (three) times daily. 08/16/20 08/16/21  Plotnikov, Evie Lacks, MD  valACYclovir (VALTREX) 1000 MG tablet Take 1 tablet (1,000 mg total) by mouth daily. 08/16/20   Plotnikov, Evie Lacks, MD  warfarin (COUMADIN) 5 MG tablet Take 1 1/2 tablets daily or as directed by anticoagulation clinic. 12/13/20   Plotnikov, Evie Lacks, MD  zolpidem (AMBIEN) 10 MG tablet 1 & 1/2 TABLETS (15MG) BY MOUTH AT BEDTIME AS NEEDED FOR SLEEP, OK TO REPEAT 1&1/2 IN 4HRS 12/13/20   Plotnikov, Evie Lacks, MD    Allergies    Bee venom, Ivp dye [iodinated diagnostic agents], Shellfish allergy, Temazepam, Amlodipine besylate, Aspirin, Atorvastatin, Cholestyramine, Codeine phosphate, Demerol [meperidine], Diphenhydramine hcl, Doxycycline, Gentamicin sulfate, Keppra [levetiracetam], Meclizine hcl, Meperidine hcl, Metronidazole, Moxifloxacin, Sulfamethoxazole, and Penicillins  Review of Systems   Review of  Systems  Cardiovascular:  Positive for chest pain.  All other systems reviewed and are negative.  Physical Exam Updated Vital Signs BP (!) 189/83   Pulse 62   Temp 98.2 F (36.8 C) (Oral)  Resp 20   Ht 5' 4"  (1.626 m)   Wt 50.3 kg   SpO2 100%   BMI 19.05 kg/m   Physical Exam Vitals and nursing note reviewed.  Constitutional:      General: She is not in acute distress.    Appearance: She is well-developed.  HENT:     Head: Atraumatic.     Mouth/Throat:     Mouth: Mucous membranes are moist.  Eyes:     Conjunctiva/sclera: Conjunctivae normal.  Cardiovascular:     Rate and Rhythm: Normal rate and regular rhythm.     Heart sounds: Murmur heard.  Pulmonary:     Effort: Pulmonary effort is normal.  Chest:     Chest wall: Tenderness (Mild midsternal tenderness to palpation no crepitus or emphysema.) present.  Abdominal:     Palpations: Abdomen is soft.     Tenderness: There is no abdominal tenderness.  Musculoskeletal:     Cervical back: Neck supple.     Right lower leg: No edema.     Left lower leg: No edema.  Skin:    Findings: No rash.  Neurological:     Mental Status: She is alert.  Psychiatric:        Mood and Affect: Mood normal.    ED Results / Procedures / Treatments   Labs (all labs ordered are listed, but only abnormal results are displayed) Labs Reviewed  BASIC METABOLIC PANEL - Abnormal; Notable for the following components:      Result Value   Sodium 129 (*)    Calcium 8.8 (*)    GFR, Estimated 59 (*)    All other components within normal limits  CBC - Abnormal; Notable for the following components:   RBC 3.52 (*)    MCV 103.1 (*)    MCH 34.4 (*)    All other components within normal limits  RESP PANEL BY RT-PCR (FLU A&B, COVID) [id number]  TROPONIN I (HIGH SENSITIVITY)  TROPONIN I (HIGH SENSITIVITY)    EKG None ED ECG REPORT   Date: 05/18/2021  Rate: 67  Rhythm: normal sinus rhythm  QRS Axis: normal  Intervals: normal  ST/T Wave  abnormalities: nonspecific ST changes  Conduction Disutrbances:none  Narrative Interpretation:   Old EKG Reviewed: unchanged  I have personally reviewed the EKG tracing and agree with the computerized printout as noted.   Radiology DG Chest 2 View  Result Date: 05/17/2021 CLINICAL DATA:  Chest pain EXAM: CHEST - 2 VIEW COMPARISON:  Chest x-ray 01/16/2020 FINDINGS: Heart size and mediastinal contours are within normal limits. No suspicious pulmonary opacities identified. No pleural effusion or pneumothorax visualized. No acute osseous abnormality appreciated. IMPRESSION: No acute intrathoracic process identified. Electronically Signed   By: Ofilia Neas M.D.   On: 05/17/2021 13:28    Procedures Procedures   Medications Ordered in ED Medications  morphine 4 MG/ML injection 4 mg (has no administration in time range)  morphine 4 MG/ML injection 4 mg (4 mg Intravenous Given 05/17/21 2239)  ondansetron (ZOFRAN) injection 4 mg (4 mg Intravenous Given 05/17/21 2239)  cloNIDine (CATAPRES) tablet 0.1 mg (0.1 mg Oral Given 05/17/21 2237)  amLODipine (NORVASC) tablet 2.5 mg (2.5 mg Oral Given 05/17/21 2236)  alum & mag hydroxide-simeth (MAALOX/MYLANTA) 200-200-20 MG/5ML suspension 30 mL (30 mLs Oral Given 05/17/21 2237)    And  lidocaine (XYLOCAINE) 2 % viscous mouth solution 15 mL (15 mLs Oral Given 05/17/21 2237)    ED Course  I have reviewed the triage vital signs  and the nursing notes.  Pertinent labs & imaging results that were available during my care of the patient were reviewed by me and considered in my medical decision making (see chart for details).    MDM Rules/Calculators/A&P                           BP (!) 98/53 (BP Location: Left Arm)   Pulse (!) 56   Temp 97.9 F (36.6 C) (Oral)   Resp 16   Ht 5' 4"  (1.626 m)   Wt 50.3 kg   SpO2 96%   BMI 19.05 kg/m   Final Clinical Impression(s) / ED Diagnoses Final diagnoses:  None    Rx / DC Orders ED Discharge Orders      None      9:40 PM Patient here with recurrent midsternal chest pain for more than 2 weeks.  Pain not relieved with heartburn medication.  She does have multiple comorbidities but overall well-appearing.  Mild tenderness to mid chest on palpation.  She endorsed pain with breathing, chest x-ray unremarkable.  She is currently on Coumadin, will check Coumadin level, and will obtain additional work-up.  CAre discussed with Dr. Roderic Palau   12:14 AM Pt with atypical chest pain ongoing x 2 weeks.  Initial trop negative, will need delta trop.  She still endorse pain.  She has elevated BP, was given her BP medication.  Pt sign out to oncoming team who will f/u on her labs and reassess.  Anticipate d/c   Nurse notify bp soft and pt has more pain.  Will obtain chest/abd/pelvis dissection study.  Oncoming provider is aware.     Domenic Moras, PA-C 05/18/21 Faustino Congress, MD 05/18/21 [phone]

## 2021-05-17 NOTE — ED Notes (Signed)
Pt ambulatory to the restroom.  

## 2021-05-18 ENCOUNTER — Emergency Department (HOSPITAL_COMMUNITY): Payer: Medicare Other

## 2021-05-18 DIAGNOSIS — I712 Thoracic aortic aneurysm, without rupture, unspecified: Secondary | ICD-10-CM | POA: Diagnosis not present

## 2021-05-18 LAB — HEPATIC FUNCTION PANEL
ALT: 95 U/L — ABNORMAL HIGH (ref 0–44)
AST: 237 U/L — ABNORMAL HIGH (ref 15–41)
Albumin: 3.9 g/dL (ref 3.5–5.0)
Alkaline Phosphatase: 49 U/L (ref 38–126)
Bilirubin, Direct: 0.2 mg/dL (ref 0.0–0.2)
Indirect Bilirubin: 0.7 mg/dL (ref 0.3–0.9)
Total Bilirubin: 0.9 mg/dL (ref 0.3–1.2)
Total Protein: 7.1 g/dL (ref 6.5–8.1)

## 2021-05-18 LAB — LIPASE, BLOOD: Lipase: 46 U/L (ref 11–51)

## 2021-05-18 LAB — TROPONIN I (HIGH SENSITIVITY)
Troponin I (High Sensitivity): 11 ng/L (ref <18)
Troponin I (High Sensitivity): 8 ng/L (ref <18)
Troponin I (High Sensitivity): 6 ng/L (ref <18)

## 2021-05-18 LAB — BRAIN NATRIURETIC PEPTIDE: B Natriuretic Peptide: 452 pg/mL — ABNORMAL HIGH (ref 0.0–100.0)

## 2021-05-18 MED ORDER — PANTOPRAZOLE SODIUM 40 MG IV SOLR
40.0000 mg | Freq: Once | INTRAVENOUS | Status: AC
  Administered 2021-05-18: 40 mg via INTRAVENOUS

## 2021-05-18 MED ORDER — PANTOPRAZOLE SODIUM 40 MG IV SOLR
40.0000 mg | Freq: Once | INTRAVENOUS | Status: DC
  Filled 2021-05-18: qty 40

## 2021-05-18 MED ORDER — MORPHINE SULFATE (PF) 4 MG/ML IV SOLN: 4.0000 mg | Freq: Once | INTRAVENOUS | Status: DC

## 2021-05-18 MED ORDER — DIPHENHYDRAMINE HCL 25 MG PO CAPS: 50.0000 mg | ORAL_CAPSULE | Freq: Once | ORAL | Status: DC

## 2021-05-18 MED ORDER — DIPHENHYDRAMINE HCL 50 MG/ML IJ SOLN: 50.0000 mg | Freq: Once | INTRAMUSCULAR | Status: DC

## 2021-05-18 MED ORDER — GADOBUTROL 1 MMOL/ML IV SOLN
5.0000 mL | Freq: Once | INTRAVENOUS | Status: AC | PRN
  Administered 2021-05-18: 5 mL via INTRAVENOUS

## 2021-05-18 MED ORDER — HYDROCORTISONE SOD SUC (PF) 250 MG IJ SOLR
200.0000 mg | Freq: Once | INTRAMUSCULAR | Status: AC
  Administered 2021-05-18: 200 mg via INTRAVENOUS
  Filled 2021-05-18: qty 200

## 2021-05-18 NOTE — ED Notes (Signed)
Patient is complaining of more pain and wants more pain medication. PA notified.

## 2021-05-18 NOTE — ED Provider Notes (Signed)
Signout received on this 87 year old female who presents with 2-week duration of chest pain.  Trip and negative x3, chest x-ray unremarkable, EKG without acute ischemic changes.  Patient at signout awaiting MRA to evaluate for aortic dissection.  Patient while in the emergency room had an episode of hypotension following receiving antihypertensives as well as morphine.  Her blood pressure has recovered.  Her chest pain remains stable.  Per signout team discussion had with patient regarding low clinical suspicion for aortic dissection given duration of pain, and clinical stability however there would like to proceed as planned originally.  Patient initially was due to have a CTA however patient is allergic to contrast dye.  Pending MRA this morning.  Physical Exam  BP 120/65   Pulse (!) 58   Temp 97.8 F (36.6 C) (Oral)   Resp 15   Ht 5\' 4"  (1.626 m)   Wt 50.3 kg   SpO2 98%   BMI 19.05 kg/m     ED Course/Procedures     Procedures  MDM  MRA without dissection.  Incidental finding of 3.1 cm aortic arch aneurysm without complicating factors.  Will provide patient follow-up with CT surgery for ongoing monitoring.  Discussed importance of follow-up from the standpoint as well as follow-up with GI and PCP for further work-up and management of her chest pain.  She has an appointment scheduled with her PCP for Tuesday afternoon.  She will call CT surgeon and GI Monday.  Discussed with Dr. Rogene Houston who is in agreement with plan.       Evlyn Courier, PA-C 05/18/21 1313    Fredia Sorrow, MD 05/23/21 [phone]

## 2021-05-18 NOTE — Discharge Instructions (Addendum)
Cardiac work-up today was reassuring. Your pain may be gastrointestinal in nature.  Can follow-up with GI physician if you would like or go back to see your primary care doctor. Return here for new/acute changes.  Your MRA was without dissection, but it did show you had a 3.1 cm aneurysm.  This is not emergent but it is important that you follow-up with cardiothoracic surgeon listed above.  It is important that this be monitored.

## 2021-05-18 NOTE — ED Notes (Signed)
Bilateral blood pressures: LA) 123/70 RA) 121/75

## 2021-05-18 NOTE — ED Provider Notes (Signed)
  Received patient at shift change.  See prior notes for full H&P.  Briefly, 87 y.o. F here with 2 weeks of chest pain.  Was hypertensive here in the ED, given her home meds as well as dose of morphine.  Initial labs reassuring, trop negative.    Plan:  delta trop pending.  BP noted to drop quite a bit so dissection study was ordered.  Will follow-up on these results, monitor BP.  1:43 AM Patient has documented contrast allergy with IV dye with anaphylaxis since 2013.  This was not disclosed to me at time of sign out.  Scan was ordered due to brief hypotension, however suspect this is related to morphine and other BP medications given concurrently.  BP has improved now.  I feel it would be highly unlikely to have dissection x2 weeks and risk of anaphylaxis is quite high.  Pain seems more GI in nature.  Will add on LFT's, lipase, 3rd trop, and repeat EKG.    5:37 AM Labs are still in process.  Patient has continued to have intermittent episodes.  Family at beside remains concerned and wants her to have dissection study as initially planned.  Will order MRA to be done this AM.  6:07 AM LFT's, lipase, and 3rd trop are all WNL.  VS remain stable.  Family still wanting to wait for MRA this morning.  Care will be signed out to oncoming provider pending MRA chest.   Larene Pickett, PA-C 05/18/21 [id number]    Ezequiel Essex, MD 05/18/21 [phone]

## 2021-05-18 NOTE — ED Notes (Signed)
Patient back from MRI.

## 2021-05-20 ENCOUNTER — Telehealth: Payer: Self-pay | Admitting: Internal Medicine

## 2021-05-20 NOTE — Telephone Encounter (Signed)
Appt 05/21/21.Marland KitchenJohny Peters

## 2021-05-20 NOTE — Telephone Encounter (Signed)
Pt. Connected to [facility] [employer] 12.2.2022.    Caller states she feels like she is having severe indigestion. The chest pain is in the middle of the chest. It is most of the time, she feels this. Sometimes the pain goes into the back and shoulder area. No fever or other symptoms. Started 3 or 4 days ago.   Advised to go to ED.

## 2021-05-21 ENCOUNTER — Ambulatory Visit (INDEPENDENT_AMBULATORY_CARE_PROVIDER_SITE_OTHER): Payer: Medicare Other | Admitting: Internal Medicine

## 2021-05-21 ENCOUNTER — Encounter: Payer: Self-pay | Admitting: Internal Medicine

## 2021-05-21 ENCOUNTER — Other Ambulatory Visit: Payer: Self-pay

## 2021-05-21 DIAGNOSIS — R0789 Other chest pain: Secondary | ICD-10-CM | POA: Diagnosis not present

## 2021-05-21 DIAGNOSIS — G8929 Other chronic pain: Secondary | ICD-10-CM | POA: Diagnosis not present

## 2021-05-21 DIAGNOSIS — R569 Unspecified convulsions: Secondary | ICD-10-CM

## 2021-05-21 DIAGNOSIS — M5440 Lumbago with sciatica, unspecified side: Secondary | ICD-10-CM

## 2021-05-21 DIAGNOSIS — E871 Hypo-osmolality and hyponatremia: Secondary | ICD-10-CM

## 2021-05-21 DIAGNOSIS — I10 Essential (primary) hypertension: Secondary | ICD-10-CM | POA: Diagnosis not present

## 2021-05-21 MED ORDER — VALACYCLOVIR HCL 500 MG PO TABS: 500.0000 mg | ORAL_TABLET | Freq: Two times a day (BID) | ORAL | 3 refills | Status: DC

## 2021-05-21 MED ORDER — FAMOTIDINE 20 MG PO TABS: 20.0000 mg | ORAL_TABLET | Freq: Two times a day (BID) | ORAL | 3 refills | Status: DC

## 2021-05-21 MED ORDER — VITAMIN D3 1.25 MG (50000 UT) PO CAPS: ORAL_CAPSULE | ORAL | 3 refills | Status: DC

## 2021-05-21 NOTE — Assessment & Plan Note (Signed)
Continue on Losartan, Norvasc. Clonidine prn high BP 

## 2021-05-21 NOTE — Progress Notes (Signed)
Back pain. Should  Very good right  Subjective:  Patient ID: Tracy Peters, female    DOB: Jul 08, 1933  Age: 87 y.o. MRN: [id number]  CC: No chief complaint on file.   HPI Tracy Peters presents for atypical CP - post - ER f/u  Per hx:  "87 year old female who presents with 2-week duration of chest pain.  Trip and negative x3, chest x-ray unremarkable, EKG without acute ischemic changes.  Patient at signout awaiting MRA to evaluate for aortic dissection.  Patient while in the emergency room had an episode of hypotension following receiving antihypertensives as well as morphine.  Her blood pressure has recovered.  Her chest pain remains stable.  Per signout team discussion had with patient regarding low clinical suspicion for aortic dissection given duration of pain, and clinical stability however there would like to proceed as planned originally.  Patient initially was due to have a CTA however patient is allergic to contrast dye.  MRI angio IMPRESSION: 1. 3.1 cm thoracic aortic arch aneurysm without complicating features. Recommend annual imaging followup by CTA or MRA. This recommendation follows 2010 ACCF/AHA/AATS/ACR/ASA/SCA/SCAI/SIR/STS/SVM Guidelines for the Diagnosis and Management of Patients with Thoracic Aortic Disease. Circulation.2010; 121: M384-Y659. Aortic aneurysm NOS (ICD10-I71.9) 2. Long segment stenosis of the left renal artery with left renal parenchymal atrophy incidentally noted."  Outpatient Medications Prior to Visit  Medication Sig Dispense Refill   amLODipine (NORVASC) 2.5 MG tablet Take 1 tablet (2.5 mg total) by mouth daily. 90 tablet 3   benzonatate (TESSALON) 100 MG capsule Take 1 capsule (100 mg total) by mouth every 8 (eight) hours. 21 capsule 1   cloNIDine (CATAPRES) 0.1 MG tablet Take 1 tablet (0.1 mg total) by mouth 2 (two) times daily. 180 tablet 3   denosumab (PROLIA) 60 MG/ML SOSY injection Prolia 60 mg/mL subcutaneous syringe      diphenoxylate-atropine (LOMOTIL) 2.5-0.025 MG tablet 1 po qid prn 120 tablet 0   EPINEPHrine 0.3 mg/0.3 mL IJ SOAJ injection USE AS DIRECTED (Patient taking differently: Inject 0.3 mg into the muscle as needed for anaphylaxis.) 2 Device 1   ferrous sulfate 325 (65 FE) MG tablet Take 1 tablet (325 mg total) by mouth every other day.     lacosamide (VIMPAT) 50 MG TABS tablet TAKE 1 TABLET BY MOUTH TWICE DAILY. 60 tablet 5   losartan (COZAAR) 100 MG tablet TAKE 1 TABLET BY MOUTH DAILY. 90 tablet 3   metoprolol succinate (TOPROL-XL) 25 MG 24 hr tablet Take 1 tablet (25 mg total) by mouth daily. 90 tablet 3   Probiotic Product (ALIGN PO) Take 1 capsule by mouth in the morning and at bedtime.      tobramycin-dexamethasone (TOBRADEX) ophthalmic solution Place 1 drop into both eyes at bedtime.     traMADol (ULTRAM) 50 MG tablet TAKE 1-2 TABLETS TWICE A DAY AS NEEDED FOR SEVERE PAIN. 90 tablet 1   triamcinolone cream (KENALOG) 0.5 % Apply 1 application topically 3 (three) times daily. 45 g 3   warfarin (COUMADIN) 5 MG tablet Take 1 1/2 tablets daily or as directed by anticoagulation clinic. 135 tablet 1   zolpidem (AMBIEN) 10 MG tablet 1 & 1/2 TABLETS (15MG ) BY MOUTH AT BEDTIME AS NEEDED FOR SLEEP, OK TO REPEAT 1&1/2 IN 4HRS 270 tablet 1   Cholecalciferol (VITAMIN D3) 1.25 MG (50000 UT) CAPS Take 1 capsule every 10 days 9 capsule 3   valACYclovir (VALTREX) 1000 MG tablet Take 1 tablet (1,000 mg total) by mouth daily. 90 tablet 3  No facility-administered medications prior to visit.    ROS: Review of Systems  Constitutional:  Positive for fatigue. Negative for activity change, appetite change, fever and unexpected weight change.  HENT:  Negative for congestion, mouth sores and sinus pressure.   Eyes:  Negative for visual disturbance.  Respiratory:  Positive for chest tightness.   Cardiovascular:  Positive for chest pain.  Gastrointestinal:  Negative for abdominal pain and nausea.  Genitourinary:   Negative for difficulty urinating, frequency and vaginal pain.  Musculoskeletal:  Positive for gait problem. Negative for back pain.  Skin:  Negative for pallor and rash.  Neurological:  Positive for weakness. Negative for dizziness, tremors, numbness and headaches.  Psychiatric/Behavioral:  Positive for decreased concentration. Negative for confusion, sleep disturbance and suicidal ideas.    Objective:  BP 130/90 (BP Location: Left Arm, Patient Position: Sitting, Cuff Size: Large)   Pulse 62   Temp 98.8 F (37.1 C) (Oral)   Ht 5\' 4"  (1.626 m)   Wt 116 lb (52.6 kg)   SpO2 98%   BMI 19.91 kg/m   BP Readings from Last 3 Encounters:  05/21/21 130/90  05/18/21 (!) 148/67  03/19/21 (!) 190/82    Wt Readings from Last 3 Encounters:  05/21/21 116 lb (52.6 kg)  05/17/21 111 lb (50.3 kg)  03/19/21 117 lb 12.8 oz (53.4 kg)    Physical Exam Constitutional:      General: She is not in acute distress.    Appearance: She is well-developed.  HENT:     Head: Normocephalic.     Right Ear: External ear normal.     Left Ear: External ear normal.     Nose: Nose normal.  Eyes:     General:        Right eye: No discharge.        Left eye: No discharge.     Conjunctiva/sclera: Conjunctivae normal.     Pupils: Pupils are equal, round, and reactive to light.  Neck:     Thyroid: No thyromegaly.     Vascular: No JVD.     Trachea: No tracheal deviation.  Cardiovascular:     Rate and Rhythm: Normal rate and regular rhythm.     Heart sounds: Normal heart sounds.  Pulmonary:     Effort: No respiratory distress.     Breath sounds: No stridor. No wheezing.  Abdominal:     General: Bowel sounds are normal. There is no distension.     Palpations: Abdomen is soft. There is no mass.     Tenderness: There is no abdominal tenderness. There is no guarding or rebound.  Musculoskeletal:        General: No tenderness.     Cervical back: Normal range of motion and neck supple. No rigidity.   Lymphadenopathy:     Cervical: No cervical adenopathy.  Skin:    Findings: No erythema or rash.  Neurological:     Cranial Nerves: No cranial nerve deficit.     Motor: No abnormal muscle tone.     Coordination: Coordination normal.     Deep Tendon Reflexes: Reflexes normal.  Psychiatric:        Behavior: Behavior normal.        Thought Content: Thought content normal.        Judgment: Judgment normal.    Lab Results  Component Value Date   WBC 6.1 05/17/2021   HGB 12.1 05/17/2021   HCT 36.3 05/17/2021   PLT 244 05/17/2021   GLUCOSE  88 05/17/2021   CHOL 196 01/03/2016   TRIG 85.0 01/03/2016   HDL 63.10 01/03/2016   LDLDIRECT 129.8 05/31/2012   LDLCALC 116 (H) 01/03/2016   ALT 95 (H) 05/18/2021   AST 237 (H) 05/18/2021   NA 129 (L) 05/17/2021   K 4.5 05/17/2021   CL 99 05/17/2021   CREATININE 0.95 05/17/2021   BUN 19 05/17/2021   CO2 23 05/17/2021   TSH 2.14 03/19/2021   INR 2.5 (H) 05/17/2021    DG Chest 2 View  Result Date: 05/17/2021 CLINICAL DATA:  Chest pain EXAM: CHEST - 2 VIEW COMPARISON:  Chest x-ray 01/16/2020 FINDINGS: Heart size and mediastinal contours are within normal limits. No suspicious pulmonary opacities identified. No pleural effusion or pneumothorax visualized. No acute osseous abnormality appreciated. IMPRESSION: No acute intrathoracic process identified. Electronically Signed   By: Ofilia Neas M.D.   On: 05/17/2021 13:28   MR ANGIO CHEST W WO CONTRAST  Result Date: 05/18/2021 CLINICAL DATA:  Thoracic aortic aneurysm, rule out dissection EXAM: MRA CHEST WITH OR WITHOUT CONTRAST TECHNIQUE: Angiographic images of the chest were obtained using MRA technique with intravenous contrast. CONTRAST:  2mL GADAVIST GADOBUTROL 1 MMOL/ML IV SOLN COMPARISON:  None available FINDINGS: Cardiovascular: Heart size normal. No pericardial effusion. The RV and central pulmonary arteries are unremarkable. Turbulence noted just distal to the aortic valve. Aortic  Root: --Valve: 2.4 cm --Sinuses: 3.1 cm --Sinotubular Junction: 2.6 cm Limitations by motion: Mild Thoracic Aorta: --Ascending Aorta: 3.4 cm --Aortic Arch: 3.1 cm --Descending Aorta: 2.6 cm No evidence of aortic dissection or stenosis. Classic 3 vessel brachiocephalic arterial origin anatomy without proximal stenosis. Visualized proximal abdominal aorta unremarkable. Mediastinum/Nodes: No mass or adenopathy. Lungs/Pleura: No pleural effusion.  No pulmonary mass evident. Upper Abdomen: Diffuse narrowing of the left renal artery. Left renal parenchymal atrophy. Musculoskeletal: No chest wall abnormality. No acute or significant osseous findings. Spinal cord: Limited evaluation, unremarkable. IMPRESSION: 1. 3.1 cm thoracic aortic arch aneurysm without complicating features. Recommend annual imaging followup by CTA or MRA. This recommendation follows 2010 ACCF/AHA/AATS/ACR/ASA/SCA/SCAI/SIR/STS/SVM Guidelines for the Diagnosis and Management of Patients with Thoracic Aortic Disease. Circulation.2010; 121: X528-U132. Aortic aneurysm NOS (ICD10-I71.9) 2. Long segment stenosis of the left renal artery with left renal parenchymal atrophy incidentally noted. Electronically Signed   By: Lucrezia Europe M.D.   On: 05/18/2021 11:19    Assessment & Plan:   Problem List Items Addressed This Visit     Chest pain, atypical     Probable esophageal spasm - ?pill esophagitis. Change Valtrex to a smaller size pill - 500 mg/d Take Pepcid daily      Essential hypertension    Continue on Losartan, Norvasc. Clonidine prn high BP      Hyponatremia    Lab Results  Component Value Date   NA 129 (L) 05/17/2021   K 4.5 05/17/2021   CO2 23 05/17/2021   GLUCOSE 88 05/17/2021   BUN 19 05/17/2021   CREATININE 0.95 05/17/2021   CALCIUM 8.8 (L) 05/17/2021   GFRNONAA 59 (L) 05/17/2021  Monitor sodium      LOW BACK PAIN    Continue on Tramadol prn - was tolerating ok  Potential benefits of a long term opioids use as well as  potential risks (i.e. addiction risk, apnea etc) and complications (i.e. Somnolence, constipation and others) were explained to the patient and were aknowledged.      Seizure ([place])    No relapse         Meds  ordered this encounter  Medications   Cholecalciferol (VITAMIN D3) 1.25 MG (50000 UT) CAPS    Sig: Take 1 capsule every 14 days    Dispense:  6 capsule    Refill:  3   valACYclovir (VALTREX) 500 MG tablet    Sig: Take 1 tablet (500 mg total) by mouth 2 (two) times daily.    Dispense:  180 tablet    Refill:  3   famotidine (PEPCID) 20 MG tablet    Sig: Take 1 tablet (20 mg total) by mouth 2 (two) times daily.    Dispense:  30 tablet    Refill:  3      Follow-up: Return in about 2 months (around 07/22/2021) for a follow-up visit.  [name], MD

## 2021-05-21 NOTE — Assessment & Plan Note (Addendum)
Probable esophageal spasm - ?pill esophagitis. Change Valtrex to a smaller size pill - 500 mg/d Take Pepcid daily

## 2021-05-21 NOTE — Assessment & Plan Note (Signed)
No relapse 

## 2021-05-21 NOTE — Patient Instructions (Addendum)
Probable esophageal spasm - ?pill esophagitis. Change Valtrex to a smaller size pill - 500 mg  Take Pepcid daily for 2 months  Rice sock heating pad

## 2021-05-21 NOTE — Assessment & Plan Note (Signed)
Continue on Tramadol prn - was tolerating ok  Potential benefits of a long term opioids use as well as potential risks (i.e. addiction risk, apnea etc) and complications (i.e. Somnolence, constipation and others) were explained to the patient and were aknowledged.

## 2021-05-21 NOTE — Assessment & Plan Note (Signed)
Lab Results  Component Value Date   NA 129 (L) 05/17/2021   K 4.5 05/17/2021   CO2 23 05/17/2021   GLUCOSE 88 05/17/2021   BUN 19 05/17/2021   CREATININE 0.95 05/17/2021   CALCIUM 8.8 (L) 05/17/2021   GFRNONAA 59 (L) 05/17/2021   Monitor sodium

## 2021-05-22 ENCOUNTER — Encounter: Payer: Self-pay | Admitting: Internal Medicine

## 2021-05-28 ENCOUNTER — Other Ambulatory Visit: Payer: Self-pay | Admitting: Internal Medicine

## 2021-05-28 MED ORDER — ONDANSETRON HCL 4 MG PO TABS: 4.0000 mg | ORAL_TABLET | Freq: Three times a day (TID) | ORAL | 0 refills | Status: DC | PRN

## 2021-06-06 DIAGNOSIS — L570 Actinic keratosis: Secondary | ICD-10-CM | POA: Diagnosis not present

## 2021-06-06 DIAGNOSIS — C44729 Squamous cell carcinoma of skin of left lower limb, including hip: Secondary | ICD-10-CM | POA: Diagnosis not present

## 2021-06-06 DIAGNOSIS — D485 Neoplasm of uncertain behavior of skin: Secondary | ICD-10-CM | POA: Diagnosis not present

## 2021-06-21 ENCOUNTER — Other Ambulatory Visit: Payer: Self-pay | Admitting: Internal Medicine

## 2021-06-22 ENCOUNTER — Other Ambulatory Visit: Payer: Self-pay | Admitting: Internal Medicine

## 2021-06-24 ENCOUNTER — Other Ambulatory Visit: Payer: Self-pay | Admitting: Internal Medicine

## 2021-06-25 ENCOUNTER — Ambulatory Visit: Payer: Medicare Other

## 2021-06-27 ENCOUNTER — Telehealth: Payer: Self-pay

## 2021-06-27 NOTE — Telephone Encounter (Signed)
Pt missed apt at [facility] on 1/10. LVM to call to [facility]. Can fit pt in this afternoon if she can make it.

## 2021-06-27 NOTE — Telephone Encounter (Signed)
Pt [facility] apt for 1/12. Pt agreed to date and time.

## 2021-07-02 ENCOUNTER — Other Ambulatory Visit: Payer: Self-pay | Admitting: Internal Medicine

## 2021-07-02 ENCOUNTER — Other Ambulatory Visit: Payer: Self-pay

## 2021-07-02 ENCOUNTER — Ambulatory Visit (INDEPENDENT_AMBULATORY_CARE_PROVIDER_SITE_OTHER): Payer: Medicare Other

## 2021-07-02 DIAGNOSIS — Z7901 Long term (current) use of anticoagulants: Secondary | ICD-10-CM

## 2021-07-02 LAB — POCT INR: INR: 4 — AB (ref 2.0–3.0)

## 2021-07-02 NOTE — Progress Notes (Addendum)
Hold dose today and then change weekly dose to take 1 tablet daily except take 1 1/2 tablets on Sun and Thurs. Recheck in 2 weeks.  Medical screening examination/treatment/procedure(s) were performed by non-physician practitioner and as supervising physician I was immediately available for consultation/collaboration.  I agree with above. Lew Dawes, MD

## 2021-07-02 NOTE — Patient Instructions (Addendum)
Pre visit review using our clinic review tool, if applicable. No additional management support is needed unless otherwise documented below in the visit note.  Hold dose today and then change weekly dose to take 1 tablet daily except take 1 1/2 tablets on Sun and Thurs. Recheck in 2 weeks.

## 2021-07-02 NOTE — Telephone Encounter (Signed)
Pt goes to coumadin clinic will forward to [facility], Therapist, sports.Marland KitchenJohny Chess

## 2021-07-02 NOTE — Telephone Encounter (Signed)
Pt is compliant with warfarin management and PCP apts. ?Sent in refill.  ?

## 2021-07-03 ENCOUNTER — Telehealth: Payer: Self-pay

## 2021-07-03 DIAGNOSIS — C4490 Unspecified malignant neoplasm of skin, unspecified: Secondary | ICD-10-CM

## 2021-07-03 NOTE — Telephone Encounter (Signed)
Pt was in yesterday for coumadin clinic and discussing her recent skin cancer removal and her dermatologist. She reported she is not particularly happy with that dermatologist and is requesting a referral to another dermatologist. She does not think she was referred to her current dermatologist. She said it was her husbands and she thinks that is why she is seeing him. Advised a msg could be sent to PCP to request a referral. Advised if she did not hear anything within a week to contact office and ask for referrals. Pt appreciative and verbalized understanding.

## 2021-07-05 DIAGNOSIS — I872 Venous insufficiency (chronic) (peripheral): Secondary | ICD-10-CM | POA: Diagnosis not present

## 2021-07-05 DIAGNOSIS — C44729 Squamous cell carcinoma of skin of left lower limb, including hip: Secondary | ICD-10-CM | POA: Diagnosis not present

## 2021-07-06 NOTE — Telephone Encounter (Signed)
Tracy Peters  I will refer her to [facility] dermatology.  Thanks

## 2021-07-08 NOTE — Telephone Encounter (Signed)
I will make sure pt is aware at her coumadin clinic apt on 1/31

## 2021-07-10 DIAGNOSIS — Z6821 Body mass index (BMI) 21.0-21.9, adult: Secondary | ICD-10-CM | POA: Diagnosis not present

## 2021-07-16 ENCOUNTER — Ambulatory Visit (INDEPENDENT_AMBULATORY_CARE_PROVIDER_SITE_OTHER): Payer: Medicare Other

## 2021-07-16 ENCOUNTER — Other Ambulatory Visit: Payer: Self-pay

## 2021-07-16 DIAGNOSIS — Z7901 Long term (current) use of anticoagulants: Secondary | ICD-10-CM | POA: Diagnosis not present

## 2021-07-16 LAB — POCT INR: INR: 2.6 (ref 2.0–3.0)

## 2021-07-16 NOTE — Telephone Encounter (Signed)
Advised pt about referral and she is wanting to establish with [facility]. Gave pt number to [facility]. Pt appreciative.

## 2021-07-16 NOTE — Patient Instructions (Addendum)
Pre visit review using our clinic review tool, if applicable. No additional management support is needed unless otherwise documented below in the visit note.  Continue 1 tablet daily except take 1 1/2 tablets on Sun and Thurs. Recheck in 4 weeks.

## 2021-07-16 NOTE — Progress Notes (Addendum)
Continue 1 tablet daily except take 1 1/2 tablets on Sun and Thurs. Recheck in 4 weeks.  Medical screening examination/treatment/procedure(s) were performed by non-physician practitioner and as supervising physician I was immediately available for consultation/collaboration.  I agree with above. Lew Dawes, MD

## 2021-07-19 DIAGNOSIS — Z4801 Encounter for change or removal of surgical wound dressing: Secondary | ICD-10-CM | POA: Diagnosis not present

## 2021-07-22 DIAGNOSIS — Z4801 Encounter for change or removal of surgical wound dressing: Secondary | ICD-10-CM | POA: Diagnosis not present

## 2021-07-25 DIAGNOSIS — S0501XA Injury of conjunctiva and corneal abrasion without foreign body, right eye, initial encounter: Secondary | ICD-10-CM | POA: Diagnosis not present

## 2021-07-25 DIAGNOSIS — H04121 Dry eye syndrome of right lacrimal gland: Secondary | ICD-10-CM | POA: Diagnosis not present

## 2021-07-25 DIAGNOSIS — H0100B Unspecified blepharitis left eye, upper and lower eyelids: Secondary | ICD-10-CM | POA: Diagnosis not present

## 2021-07-25 DIAGNOSIS — H0100A Unspecified blepharitis right eye, upper and lower eyelids: Secondary | ICD-10-CM | POA: Diagnosis not present

## 2021-07-29 ENCOUNTER — Other Ambulatory Visit: Payer: Self-pay | Admitting: Internal Medicine

## 2021-08-02 DIAGNOSIS — S81802A Unspecified open wound, left lower leg, initial encounter: Secondary | ICD-10-CM | POA: Diagnosis not present

## 2021-08-02 DIAGNOSIS — I872 Venous insufficiency (chronic) (peripheral): Secondary | ICD-10-CM | POA: Diagnosis not present

## 2021-08-06 ENCOUNTER — Ambulatory Visit (INDEPENDENT_AMBULATORY_CARE_PROVIDER_SITE_OTHER): Payer: Medicare Other | Admitting: Internal Medicine

## 2021-08-06 ENCOUNTER — Encounter: Payer: Self-pay | Admitting: Internal Medicine

## 2021-08-06 ENCOUNTER — Other Ambulatory Visit: Payer: Self-pay

## 2021-08-06 DIAGNOSIS — E871 Hypo-osmolality and hyponatremia: Secondary | ICD-10-CM | POA: Diagnosis not present

## 2021-08-06 DIAGNOSIS — M5440 Lumbago with sciatica, unspecified side: Secondary | ICD-10-CM

## 2021-08-06 DIAGNOSIS — C4490 Unspecified malignant neoplasm of skin, unspecified: Secondary | ICD-10-CM | POA: Diagnosis not present

## 2021-08-06 DIAGNOSIS — G8929 Other chronic pain: Secondary | ICD-10-CM

## 2021-08-06 DIAGNOSIS — I10 Essential (primary) hypertension: Secondary | ICD-10-CM | POA: Diagnosis not present

## 2021-08-06 DIAGNOSIS — R569 Unspecified convulsions: Secondary | ICD-10-CM | POA: Diagnosis not present

## 2021-08-06 LAB — COMPREHENSIVE METABOLIC PANEL
ALT: 27 U/L (ref 0–35)
AST: 25 U/L (ref 0–37)
Albumin: 4 g/dL (ref 3.5–5.2)
Alkaline Phosphatase: 43 U/L (ref 39–117)
BUN: 14 mg/dL (ref 6–23)
CO2: 28 mEq/L (ref 19–32)
Calcium: 9.4 mg/dL (ref 8.4–10.5)
Chloride: 97 mEq/L (ref 96–112)
Creatinine, Ser: 0.91 mg/dL (ref 0.40–1.20)
GFR: 57.35 mL/min — ABNORMAL LOW (ref >60.00)
Glucose, Bld: 90 mg/dL (ref 70–99)
Potassium: 4.2 mEq/L (ref 3.5–5.1)
Sodium: 130 mEq/L — ABNORMAL LOW (ref 135–145)
Total Bilirubin: 0.6 mg/dL (ref 0.2–1.2)
Total Protein: 7 g/dL (ref 6.0–8.3)

## 2021-08-06 MED ORDER — METOPROLOL SUCCINATE ER 25 MG PO TB24: 25.0000 mg | ORAL_TABLET | Freq: Every day | ORAL | 3 refills | Status: DC

## 2021-08-06 MED ORDER — TRAMADOL HCL 50 MG PO TABS: ORAL_TABLET | ORAL | 1 refills | Status: DC

## 2021-08-06 MED ORDER — ZOLPIDEM TARTRATE 10 MG PO TABS: ORAL_TABLET | ORAL | 1 refills | Status: DC

## 2021-08-06 NOTE — Assessment & Plan Note (Signed)
LLE wraped, edema above [place] wrap post-op. Elevate leg. F/u w/Derm

## 2021-08-06 NOTE — Assessment & Plan Note (Signed)
Monitoring CMET

## 2021-08-06 NOTE — Assessment & Plan Note (Signed)
On Vimpat 

## 2021-08-06 NOTE — Assessment & Plan Note (Signed)
Continue on Losartan, Norvasc.

## 2021-08-06 NOTE — Progress Notes (Signed)
Subjective:  Patient ID: Tracy Peters, female    DOB: November 27, 1933  Age: 88 y.o. MRN: [id number]  CC: Follow-up   HPI Maysa Lynn presents for HTN, seizures, insomnia, LBP  Outpatient Medications Prior to Visit  Medication Sig Dispense Refill   amLODipine (NORVASC) 2.5 MG tablet Take 1 tablet (2.5 mg total) by mouth daily. 90 tablet 3   Cholecalciferol (VITAMIN D3) 1.25 MG (50000 UT) CAPS Take 1 capsule every 14 days 6 capsule 3   cloNIDine (CATAPRES) 0.1 MG tablet Take 1 tablet (0.1 mg total) by mouth 2 (two) times daily. 180 tablet 3   denosumab (PROLIA) 60 MG/ML SOSY injection Prolia 60 mg/mL subcutaneous syringe     diphenoxylate-atropine (LOMOTIL) 2.5-0.025 MG tablet TAKE ONE TABLET BY MOUTH FOUR TIMES DAILY 120 tablet 0   doxycycline (VIBRA-TABS) 100 MG tablet Take 100 mg by mouth 2 (two) times daily.     EPINEPHrine 0.3 mg/0.3 mL IJ SOAJ injection USE AS DIRECTED (Patient taking differently: Inject 0.3 mg into the muscle as needed for anaphylaxis.) 2 Device 1   famotidine (PEPCID) 20 MG tablet Take 1 tablet (20 mg total) by mouth 2 (two) times daily. 30 tablet 3   ferrous sulfate 325 (65 FE) MG tablet Take 1 tablet (325 mg total) by mouth every other day.     losartan (COZAAR) 100 MG tablet TAKE 1 TABLET BY MOUTH DAILY. 90 tablet 2   ondansetron (ZOFRAN) 4 MG tablet Take 1 tablet (4 mg total) by mouth every 8 (eight) hours as needed for nausea or vomiting. 20 tablet 0   Probiotic Product (ALIGN PO) Take 1 capsule by mouth in the morning and at bedtime.      tobramycin-dexamethasone (TOBRADEX) ophthalmic solution Place 1 drop into both eyes at bedtime.     triamcinolone cream (KENALOG) 0.5 % Apply 1 application topically 3 (three) times daily. 45 g 3   valACYclovir (VALTREX) 500 MG tablet Take 1 tablet (500 mg total) by mouth 2 (two) times daily. 180 tablet 3   VIMPAT 50 MG TABS tablet TAKE 1 TABLET TWICE A DAY. 60 tablet 5   warfarin (COUMADIN) 5 MG tablet TAKE 1  1/2 TABLETS BY MOUTH DAILY OR AS DIRECTED BY ANTICOAGULATION CLINIC 135 tablet 1   metoprolol succinate (TOPROL-XL) 25 MG 24 hr tablet Take 1 tablet (25 mg total) by mouth daily. 90 tablet 3   traMADol (ULTRAM) 50 MG tablet TAKE 1-2 TABLETS TWICE A DAY AS NEEDED FOR SEVERE PAIN. 90 tablet 1   zolpidem (AMBIEN) 10 MG tablet 1 & 1/2 TABLETS (15MG ) BY MOUTH AT BEDTIME AS NEEDED FOR SLEEP, OK TO REPEAT 1&1/2 IN 4HRS 270 tablet 1   benzonatate (TESSALON) 100 MG capsule Take 1 capsule (100 mg total) by mouth every 8 (eight) hours. (Patient not taking: Reported on 08/06/2021) 21 capsule 1   No facility-administered medications prior to visit.    ROS: Review of Systems  Constitutional:  Negative for activity change, appetite change, chills, fatigue and unexpected weight change.  HENT:  Negative for congestion, mouth sores and sinus pressure.   Eyes:  Negative for visual disturbance.  Respiratory:  Negative for cough and chest tightness.   Gastrointestinal:  Negative for abdominal pain and nausea.  Genitourinary:  Negative for difficulty urinating, frequency and vaginal pain.  Musculoskeletal:  Positive for arthralgias and back pain. Negative for gait problem.  Skin:  Negative for pallor and rash.  Neurological:  Negative for dizziness, tremors, weakness, numbness and headaches.  Hematological:  Negative for adenopathy. Bruises/bleeds easily.  Psychiatric/Behavioral:  Positive for decreased concentration and sleep disturbance. Negative for behavioral problems, confusion and suicidal ideas. The patient is nervous/anxious.    Objective:  BP (!) 160/92 (BP Location: Left Arm, Patient Position: Sitting, Cuff Size: Normal)    Pulse 80    Temp 98 F (36.7 C) (Oral)    Ht 5\' 4"  (1.626 m)    Wt 121 lb (54.9 kg)    SpO2 98%    BMI 20.77 kg/m   BP Readings from Last 3 Encounters:  08/06/21 (!) 160/92  05/21/21 130/90  05/18/21 (!) 148/67    Wt Readings from Last 3 Encounters:  08/06/21 121 lb (54.9  kg)  05/21/21 116 lb (52.6 kg)  05/17/21 111 lb (50.3 kg)    Physical Exam Constitutional:      General: She is not in acute distress.    Appearance: She is well-developed.  HENT:     Head: Normocephalic.     Right Ear: External ear normal.     Left Ear: External ear normal.     Nose: Nose normal.  Eyes:     General:        Right eye: No discharge.        Left eye: No discharge.     Conjunctiva/sclera: Conjunctivae normal.     Pupils: Pupils are equal, round, and reactive to light.  Neck:     Thyroid: No thyromegaly.     Vascular: No JVD.     Trachea: No tracheal deviation.  Cardiovascular:     Rate and Rhythm: Normal rate and regular rhythm.     Heart sounds: Normal heart sounds.  Pulmonary:     Effort: No respiratory distress.     Breath sounds: No stridor. No wheezing.  Abdominal:     General: Bowel sounds are normal. There is no distension.     Palpations: Abdomen is soft. There is no mass.     Tenderness: There is no abdominal tenderness. There is no guarding or rebound.  Musculoskeletal:        General: Swelling and tenderness present.     Cervical back: Normal range of motion and neck supple. No rigidity.  Lymphadenopathy:     Cervical: No cervical adenopathy.  Skin:    Findings: No erythema or rash.  Neurological:     Mental Status: She is oriented to person, place, and time.     Cranial Nerves: No cranial nerve deficit.     Motor: No abnormal muscle tone.     Coordination: Coordination normal.     Deep Tendon Reflexes: Reflexes normal.  Psychiatric:        Behavior: Behavior normal.        Thought Content: Thought content normal.        Judgment: Judgment normal.   LLE wraped, edema above Koban wrap post-op  Lab Results  Component Value Date   WBC 6.1 05/17/2021   HGB 12.1 05/17/2021   HCT 36.3 05/17/2021   PLT 244 05/17/2021   GLUCOSE 88 05/17/2021   CHOL 196 01/03/2016   TRIG 85.0 01/03/2016   HDL 63.10 01/03/2016   LDLDIRECT 129.8 05/31/2012    LDLCALC 116 (H) 01/03/2016   ALT 95 (H) 05/18/2021   AST 237 (H) 05/18/2021   NA 129 (L) 05/17/2021   K 4.5 05/17/2021   CL 99 05/17/2021   CREATININE 0.95 05/17/2021   BUN 19 05/17/2021   CO2 23 05/17/2021   TSH  2.14 03/19/2021   INR 2.6 07/16/2021    DG Chest 2 View  Result Date: 05/17/2021 CLINICAL DATA:  Chest pain EXAM: CHEST - 2 VIEW COMPARISON:  Chest x-ray 01/16/2020 FINDINGS: Heart size and mediastinal contours are within normal limits. No suspicious pulmonary opacities identified. No pleural effusion or pneumothorax visualized. No acute osseous abnormality appreciated. IMPRESSION: No acute intrathoracic process identified. Electronically Signed   By: Ofilia Neas M.D.   On: 05/17/2021 13:28   MR ANGIO CHEST W WO CONTRAST  Result Date: 05/18/2021 CLINICAL DATA:  Thoracic aortic aneurysm, rule out dissection EXAM: MRA CHEST WITH OR WITHOUT CONTRAST TECHNIQUE: Angiographic images of the chest were obtained using MRA technique with intravenous contrast. CONTRAST:  13mL GADAVIST GADOBUTROL 1 MMOL/ML IV SOLN COMPARISON:  None available FINDINGS: Cardiovascular: Heart size normal. No pericardial effusion. The RV and central pulmonary arteries are unremarkable. Turbulence noted just distal to the aortic valve. Aortic Root: --Valve: 2.4 cm --Sinuses: 3.1 cm --Sinotubular Junction: 2.6 cm Limitations by motion: Mild Thoracic Aorta: --Ascending Aorta: 3.4 cm --Aortic Arch: 3.1 cm --Descending Aorta: 2.6 cm No evidence of aortic dissection or stenosis. Classic 3 vessel brachiocephalic arterial origin anatomy without proximal stenosis. Visualized proximal abdominal aorta unremarkable. Mediastinum/Nodes: No mass or adenopathy. Lungs/Pleura: No pleural effusion.  No pulmonary mass evident. Upper Abdomen: Diffuse narrowing of the left renal artery. Left renal parenchymal atrophy. Musculoskeletal: No chest wall abnormality. No acute or significant osseous findings. Spinal cord: Limited evaluation,  unremarkable. IMPRESSION: 1. 3.1 cm thoracic aortic arch aneurysm without complicating features. Recommend annual imaging followup by CTA or MRA. This recommendation follows 2010 ACCF/AHA/AATS/ACR/ASA/SCA/SCAI/SIR/STS/SVM Guidelines for the Diagnosis and Management of Patients with Thoracic Aortic Disease. Circulation.2010; 121: Z610-R604. Aortic aneurysm NOS (ICD10-I71.9) 2. Long segment stenosis of the left renal artery with left renal parenchymal atrophy incidentally noted. Electronically Signed   By: Lucrezia Europe M.D.   On: 05/18/2021 11:19    Assessment & Plan:   Problem List Items Addressed This Visit     Essential hypertension    Continue on Losartan, Norvasc.      Relevant Medications   metoprolol succinate (TOPROL-XL) 25 MG 24 hr tablet   Other Relevant Orders   Comprehensive metabolic panel   Hyponatremia    Monitoring CMET      Relevant Orders   Comprehensive metabolic panel   LOW BACK PAIN    Chronic  Continue on Tramadol prn - was tolerating ok  Potential benefits of a long term opioids use as well as potential risks (i.e. addiction risk, apnea etc) and complications (i.e. Somnolence, constipation and others) were explained to the patient and were aknowledged.      Relevant Medications   traMADol (ULTRAM) 50 MG tablet   Seizure (HCC)     On Vimpat      Relevant Medications   zolpidem (AMBIEN) 10 MG tablet   Skin cancer    LLE wraped, edema above Koban wrap post-op. Elevate leg. F/u w/Derm      Relevant Medications   doxycycline (VIBRA-TABS) 100 MG tablet      Meds ordered this encounter  Medications   metoprolol succinate (TOPROL-XL) 25 MG 24 hr tablet    Sig: Take 1 tablet (25 mg total) by mouth daily.    Dispense:  90 tablet    Refill:  3   zolpidem (AMBIEN) 10 MG tablet    Sig: 1 & 1/2 TABLETS (15MG ) BY MOUTH AT BEDTIME AS NEEDED FOR SLEEP, OK TO REPEAT 1&1/2 IN 4HRS  Dispense:  270 tablet    Refill:  1   traMADol (ULTRAM) 50 MG tablet    Sig:  TAKE 1-2 TABLETS TWICE A DAY AS NEEDED FOR SEVERE PAIN.    Dispense:  270 tablet    Refill:  1      Follow-up: Return in about 3 months (around 11/03/2021) for a follow-up visit.  [name], MD

## 2021-08-06 NOTE — Assessment & Plan Note (Signed)
Chronic  Continue on Tramadol prn - was tolerating ok  Potential benefits of a long term opioids use as well as potential risks (i.e. addiction risk, apnea etc) and complications (i.e. Somnolence, constipation and others) were explained to the patient and were aknowledged.

## 2021-08-07 DIAGNOSIS — L814 Other melanin hyperpigmentation: Secondary | ICD-10-CM | POA: Diagnosis not present

## 2021-08-07 DIAGNOSIS — L810 Postinflammatory hyperpigmentation: Secondary | ICD-10-CM | POA: Diagnosis not present

## 2021-08-07 DIAGNOSIS — Z08 Encounter for follow-up examination after completed treatment for malignant neoplasm: Secondary | ICD-10-CM | POA: Diagnosis not present

## 2021-08-07 DIAGNOSIS — Z86007 Personal history of in-situ neoplasm of skin: Secondary | ICD-10-CM | POA: Diagnosis not present

## 2021-08-13 ENCOUNTER — Other Ambulatory Visit: Payer: Self-pay

## 2021-08-13 ENCOUNTER — Ambulatory Visit (INDEPENDENT_AMBULATORY_CARE_PROVIDER_SITE_OTHER): Payer: Medicare Other

## 2021-08-13 DIAGNOSIS — Z7901 Long term (current) use of anticoagulants: Secondary | ICD-10-CM | POA: Diagnosis not present

## 2021-08-13 LAB — POCT INR: INR: 1.4 — AB (ref 2.0–3.0)

## 2021-08-13 NOTE — Progress Notes (Addendum)
Pt reports eating 1 1/2 pints of blueberries in the last few days. She is now finished with the blueberries. Educated pt about the vitamin K content.  Increase dose today to take 1 1/2 tablets and increase dose tomorrow to take 1 1/2 tablets and then continue 1 tablet daily except take 1 1/2 tablets on Sun and Thurs. Recheck in 2 weeks.  Medical screening examination/treatment/procedure(s) were performed by non-physician practitioner and as supervising physician I was immediately available for consultation/collaboration.  I agree with above. Lew Dawes, MD

## 2021-08-13 NOTE — Patient Instructions (Addendum)
Pre visit review using our clinic review tool, if applicable. No additional management support is needed unless otherwise documented below in the visit note.  Increase dose today to take 1 1/2 tablets and increase dose tomorrow to take 1 1/2 tablets and then continue 1 tablet daily except take 1 1/2 tablets on Sun and Thurs. Recheck in 2 weeks.

## 2021-08-14 ENCOUNTER — Telehealth: Payer: Self-pay | Admitting: *Deleted

## 2021-08-14 DIAGNOSIS — I872 Venous insufficiency (chronic) (peripheral): Secondary | ICD-10-CM | POA: Diagnosis not present

## 2021-08-14 NOTE — Telephone Encounter (Signed)
PA for Tramadol started ?Key: [id number] ?

## 2021-08-27 ENCOUNTER — Other Ambulatory Visit: Payer: Self-pay

## 2021-08-27 ENCOUNTER — Ambulatory Visit (INDEPENDENT_AMBULATORY_CARE_PROVIDER_SITE_OTHER): Payer: Medicare Other

## 2021-08-27 DIAGNOSIS — Z7901 Long term (current) use of anticoagulants: Secondary | ICD-10-CM | POA: Diagnosis not present

## 2021-08-27 LAB — POCT INR: INR: 2.2 (ref 2.0–3.0)

## 2021-08-27 NOTE — Patient Instructions (Addendum)
Pre visit review using our clinic review tool, if applicable. No additional management support is needed unless otherwise documented below in the visit note. ? ?Continue 1 tablet daily except take 1 1/2 tablets on Sun and Thurs. Recheck in 4 weeks. ?

## 2021-08-27 NOTE — Progress Notes (Addendum)
Continue 1 tablet daily except take 1 1/2 tablets on Sun and Thurs. Recheck in 4 weeks. ? ?Medical screening examination/treatment/procedure(s) were performed by non-physician practitioner and as supervising physician I was immediately available for consultation/collaboration.  I agree with above. Lew Dawes, MD ?

## 2021-08-28 DIAGNOSIS — I872 Venous insufficiency (chronic) (peripheral): Secondary | ICD-10-CM | POA: Diagnosis not present

## 2021-09-09 DIAGNOSIS — S81802A Unspecified open wound, left lower leg, initial encounter: Secondary | ICD-10-CM | POA: Diagnosis not present

## 2021-09-14 ENCOUNTER — Other Ambulatory Visit: Payer: Self-pay | Admitting: Internal Medicine

## 2021-09-16 NOTE — Telephone Encounter (Signed)
LOV 08/06/21

## 2021-09-27 ENCOUNTER — Ambulatory Visit (INDEPENDENT_AMBULATORY_CARE_PROVIDER_SITE_OTHER): Payer: Medicare Other

## 2021-09-27 DIAGNOSIS — Z7901 Long term (current) use of anticoagulants: Secondary | ICD-10-CM

## 2021-09-27 LAB — POCT INR: INR: 2.7 (ref 2.0–3.0)

## 2021-09-27 NOTE — Patient Instructions (Addendum)
Pre visit review using our clinic review tool, if applicable. No additional management support is needed unless otherwise documented below in the visit note. ? ?Continue 1 tablet daily except take 1 1/2 tablets on Sun and Thurs. Recheck in 5 weeks. ?

## 2021-09-27 NOTE — Progress Notes (Addendum)
Continue 1 tablet daily except take 1 1/2 tablets on Sun and Thurs. Recheck in 5 weeks. ? ?Medical screening examination/treatment/procedure(s) were performed by non-physician practitioner and as supervising physician I was immediately available for consultation/collaboration.  I agree with above. Lew Dawes, MD ? ?

## 2021-10-14 DIAGNOSIS — Z08 Encounter for follow-up examination after completed treatment for malignant neoplasm: Secondary | ICD-10-CM | POA: Diagnosis not present

## 2021-10-14 DIAGNOSIS — L814 Other melanin hyperpigmentation: Secondary | ICD-10-CM | POA: Diagnosis not present

## 2021-10-14 DIAGNOSIS — L218 Other seborrheic dermatitis: Secondary | ICD-10-CM | POA: Diagnosis not present

## 2021-10-14 DIAGNOSIS — Z86007 Personal history of in-situ neoplasm of skin: Secondary | ICD-10-CM | POA: Diagnosis not present

## 2021-10-14 DIAGNOSIS — L810 Postinflammatory hyperpigmentation: Secondary | ICD-10-CM | POA: Diagnosis not present

## 2021-10-14 DIAGNOSIS — L570 Actinic keratosis: Secondary | ICD-10-CM | POA: Diagnosis not present

## 2021-10-16 DIAGNOSIS — L928 Other granulomatous disorders of the skin and subcutaneous tissue: Secondary | ICD-10-CM | POA: Diagnosis not present

## 2021-10-16 DIAGNOSIS — Z4801 Encounter for change or removal of surgical wound dressing: Secondary | ICD-10-CM | POA: Diagnosis not present

## 2021-10-29 ENCOUNTER — Ambulatory Visit: Payer: Medicare Other

## 2021-10-29 ENCOUNTER — Ambulatory Visit (INDEPENDENT_AMBULATORY_CARE_PROVIDER_SITE_OTHER): Payer: Medicare Other

## 2021-10-29 ENCOUNTER — Telehealth: Payer: Self-pay | Admitting: Internal Medicine

## 2021-10-29 DIAGNOSIS — Z7901 Long term (current) use of anticoagulants: Secondary | ICD-10-CM | POA: Diagnosis not present

## 2021-10-29 LAB — POCT INR: INR: 2.2 (ref 2.0–3.0)

## 2021-10-29 NOTE — Telephone Encounter (Signed)
LVM for pt to rtn my call to schedule AWV with [facility]. Please schedule AWV with [facility] if pt calls the office.  ?

## 2021-10-29 NOTE — Patient Instructions (Addendum)
Pre visit review using our clinic review tool, if applicable. No additional management support is needed unless otherwise documented below in the visit note.  Continue 1 tablet daily except take 1 1/2 tablets on Sun and Thurs. Recheck in 6 weeks. 

## 2021-10-29 NOTE — Progress Notes (Addendum)
Continue 1 tablet daily except take 1 1/2 tablets on Sun and Thurs. Recheck in 6 weeks. ?

## 2021-11-04 ENCOUNTER — Ambulatory Visit (INDEPENDENT_AMBULATORY_CARE_PROVIDER_SITE_OTHER): Payer: Medicare Other

## 2021-11-04 DIAGNOSIS — Z0000 Encounter for general adult medical examination without abnormal findings: Secondary | ICD-10-CM

## 2021-11-04 NOTE — Progress Notes (Cosign Needed Addendum)
Subjective:   Tracy Peters is a 88 y.o. female who presents for Medicare Annual (Subsequent) preventive examination.   I connected with Cathren Laine today by telephone and verified that I am speaking with the correct person using two identifiers. Location patient: home Location provider: work Persons participating in the virtual visit: patient, provider.   I discussed the limitations, risks, security and privacy concerns of performing an evaluation and management service by telephone and the availability of in person appointments. I also discussed with the patient that there may be a patient responsible charge related to this service. The patient expressed understanding and verbally consented to this telephonic visit.    Interactive audio and video telecommunications were attempted between this provider and patient, however failed, due to patient having technical difficulties OR patient did not have access to video capability.  We continued and completed visit with audio only.    Review of Systems     Cardiac Risk Factors include: advanced age (>37mn, >>29women);hypertension     Objective:    Today's Vitals   There is no height or weight on file to calculate BMI.     11/04/2021    3:49 PM 05/17/2021    1:12 PM 10/30/2020   10:42 AM 08/25/2018    9:20 AM 08/09/2018   10:21 AM 08/03/2018    1:51 PM 07/30/2018   10:27 AM  Advanced Directives  Does Patient Have a Medical Advance Directive? Yes Yes Yes Yes Yes Yes Yes  Type of [place]of [place] will [place] will [place] will Living will [place]Living will [place]Living will HLas PiedrasLiving will  Does patient want to make changes to medical advance directive?   No - Patient declined  No - Patient declined No - Patient declined No - Patient declined  Copy of [place] Chart? No - copy requested  No - copy requested Yes - validated most recent copy scanned in chart (See row information) Yes - validated most recent copy scanned in chart (See row information) Yes - validated most recent copy scanned in chart (See row information) No - copy requested  Would patient like information on creating a medical advance directive?     No - Patient declined No - Patient declined     Current Medications (verified) Outpatient Encounter Medications as of 11/04/2021  Medication Sig   amLODipine (NORVASC) 2.5 MG tablet Take 1 tablet (2.5 mg total) by mouth daily.   benzonatate (TESSALON) 100 MG capsule Take 1 capsule (100 mg total) by mouth every 8 (eight) hours.   Cholecalciferol (VITAMIN D3) 1.25 MG (50000 UT) CAPS Take 1 capsule every 14 days   cloNIDine (CATAPRES) 0.1 MG tablet Take 1 tablet (0.1 mg total) by mouth 2 (two) times daily.   denosumab (PROLIA) 60 MG/ML SOSY injection Prolia 60 mg/mL subcutaneous syringe   diphenoxylate-atropine (LOMOTIL) 2.5-0.025 MG tablet TAKE ONE TABLET BY MOUTH FOUR TIMES DAILY   doxycycline (VIBRA-TABS) 100 MG tablet Take 100 mg by mouth 2 (two) times daily.   EPINEPHrine 0.3 mg/0.3 mL IJ SOAJ injection USE AS DIRECTED (Patient taking differently: Inject 0.3 mg into the muscle as needed for anaphylaxis.)   famotidine (PEPCID) 20 MG tablet Take 1 tablet (20 mg total) by mouth 2 (two) times daily.   ferrous sulfate 325 (65 FE) MG tablet Take 1 tablet (325 mg total) by mouth every other day.  losartan (COZAAR) 100 MG tablet TAKE 1 TABLET BY MOUTH DAILY.   metoprolol succinate (TOPROL-XL) 25 MG 24 hr tablet Take 1 tablet (25 mg total) by mouth daily.   ondansetron (ZOFRAN) 4 MG tablet Take 1 tablet (4 mg total) by mouth every 8 (eight) hours as needed for nausea or vomiting.   Probiotic Product (ALIGN PO) Take 1 capsule by mouth in the morning and at bedtime.    tobramycin-dexamethasone (TOBRADEX) ophthalmic solution Place  1 drop into both eyes at bedtime.   traMADol (ULTRAM) 50 MG tablet TAKE 1-2 TABLETS TWICE A DAY AS NEEDED FOR SEVERE PAIN.   valACYclovir (VALTREX) 500 MG tablet Take 1 tablet (500 mg total) by mouth 2 (two) times daily.   VIMPAT 50 MG TABS tablet TAKE 1 TABLET TWICE A DAY.   warfarin (COUMADIN) 5 MG tablet TAKE 1 1/2 TABLETS BY MOUTH DAILY OR AS DIRECTED BY ANTICOAGULATION CLINIC   zolpidem (AMBIEN) 10 MG tablet 1 & 1/2 TABLETS ('15MG'$ ) BY MOUTH AT BEDTIME AS NEEDED FOR SLEEP, OK TO REPEAT 1&1/2 IN 4HRS   No facility-administered encounter medications on file as of 11/04/2021.    Allergies (verified) Bee venom, Honey bee venom, Iohexol, Ivp dye [iodinated contrast media], Meperidine hcl, Shellfish allergy, Temazepam, Amlodipine besylate, Aspirin, Atorvastatin, Cholestyramine, Codeine phosphate, Demerol [meperidine], Diphenhydramine hcl, Doxycycline, Erythromycin base, Gentamicin sulfate, Keppra [levetiracetam], Meclizine, Meclizine hcl, Metronidazole, Moxifloxacin, Niacin, Sulfamethoxazole, Amoxicillin, Iodine, Penicillins, and Sulfa antibiotics   History: Past Medical History:  Diagnosis Date   Adenomatous colon polyp    Allergic rhinitis    uses FLonase nightly   Anemia    Arthritis    joint pain   Blood transfusion    Bronchitis    hx of-7-42yr ago   Carpal tunnel syndrome    numbness and tingling    Cataracts, bilateral    Chronic anticoagulation 03/21/2013   Congenital absence of one kidney    pt only has one kidney   Diarrhea    takes Immodium daily as needed or Lomotil    Diverticulosis    GERD (gastroesophageal reflux disease)    mild   Herpes    takes Valtrex daily   History of colon polyps    History of DVT (deep vein thrombosis) 1998   right arm   History of staph infection    Hyperlipidemia    Medical MD doesn't think its absolutely necessary   Hypertension    takes Losartan daily   IBS (irritable bowel syndrome)    takes Align daily   Insomnia    takes  Ambien nightly as needed   LBP (low back pain)    Long term (current) use of anticoagulants    Dr. GBeryle Beams  Nocturia    Osteoporosis    Personal history of radiation therapy    Raynaud's disease    Sarcoma of left lower extremity ([place]    Dr. 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 digital artery 1998 idiopathic    Urinary leakage    UTI (urinary tract infection)    Past Surgical History:  Procedure Laterality Date    kidney disease left      ABDOMINAL HYSTERECTOMY     ANTERIOR CERVICAL DECOMP/DISCECTOMY FUSION N/A 01/18/2014   Procedure: ANTERIOR CERVICAL DECOMPRESSION/DISCECTOMY FUSION 3 LEVELS  Cervical  four/five, five/six, six/seven anterior cervical decompression with fusion interbody prosthesis with plating and  bonegraft;  Surgeon: Newman Pies, MD;  Location: [facility];  Service: Neurosurgery;  Laterality: N/A;  t   APPENDECTOMY     BACK SURGERY     2   BREAST BIOPSY Left 30+ yrs ago   benign   BREAST BIOPSY Left 07/09/2018   BREAST LUMPECTOMY Left 08/06/2018   BREAST LUMPECTOMY WITH RADIOACTIVE SEED LOCALIZATION Left 08/06/2018   Procedure: LEFT BREAST LUMPECTOMY WITH RADIOACTIVE SEED LOCALIZATION;  Surgeon: Fanny Skates, MD;  Location: [facility];  Service: General;  Laterality: Left;   BREAST SURGERY     cystectomy-benign   CARPAL TUNNEL RELEASE Right 06/12/2014   Procedure: CARPAL TUNNEL RELEASE;  Surgeon: Newman Pies, MD;  Location: [facility];  Service: Neurosurgery;  Laterality: Right;  Right Carpal Tunnel Release   CATARACT EXTRACTION Bilateral 02/15/2015   CHOLECYSTECTOMY     COLONOSCOPY     ECTOPIC PREGNANCY SURGERY  1959   HIP CLOSED REDUCTION Right 08/23/2017   Procedure: CLOSED MANIPULATION HIP;  Surgeon: Nicholes Stairs, MD;  Location: [facility];  Service: Orthopedics;  Laterality: Right;   I&D of abdomen  1988   couple  of wks after gallbladder removed   KNEE SURGERY     left x 3   LUMBAR LAMINECTOMY     X 2   mortons neuromas removed     OPEN SURGICAL REPAIR OF GLUTEAL TENDON Right 06/15/2017   Procedure: OPEN SURGICAL REPAIR OF GLUTEALmedius TENDON;  Surgeon: Paralee Cancel, MD;  Location: [facility];  Service: Orthopedics;  Laterality: Right;   SHOULDER SURGERY     Right   TOTAL HIP ARTHROPLASTY Right 06/15/2017   Procedure: Right total hip arthroplasty, bursectomy, repair of gluteal tendon, posterior approach;  Surgeon: Paralee Cancel, MD;  Location: [facility];  Service: Orthopedics;  Laterality: Right;  90 mins for all procedures together   Family History  Problem Relation Age of Onset   Breast cancer Sister    Kidney disease Sister 5       nephritis   Parkinsonism Mother    Heart disease Brother    Osteoarthritis Other    Ulcerative colitis Sister    Kidney disease Sister    Breast cancer Sister    Colon cancer Neg Hx    Social History   Socioeconomic History   Marital status: Married    Spouse name: Not on file   Number of children: 2   Years of education: Not on file   Highest education level: Not on file  Occupational History   Occupation: Retired    Fish farm manager: RETIRED  Tobacco Use   Smoking status: Never   Smokeless tobacco: Never  Vaping Use   Vaping Use: Never used  Substance and Sexual Activity   Alcohol use: No    Alcohol/week: 0.0 standard drinks   Drug use: No   Sexual activity: Never  Other Topics Concern   Not on file  Social History Narrative   Lives home with husband.  Education- graduate level.  Children 2.     Social Determinants of Health   Financial Resource Strain: Low Risk    Difficulty of Paying Living Expenses: Not hard at all  Food Insecurity: No Food Insecurity   Worried About Charity fundraiser in the Last Year: Never true   [place] in the Last Year: Never true  Transportation Needs: No Transportation Needs   Lack of Transportation (Medical):  No   Lack of Transportation (Non-Medical): No  Physical  Activity: Insufficiently Active   Days of Exercise per Week: 5 days   Minutes of Exercise per Session: 10 min  Stress: No Stress Concern Present   Feeling of Stress : Not at all  Social Connections: Moderately Integrated   Frequency of Communication with Friends and Family: Three times a week   Frequency of Social Gatherings with Friends and Family: Three times a week   Attends Religious Services: 1 to 4 times per year   Active Member of Clubs or Organizations: No   Attends Archivist Meetings: Never   Marital Status: Married    Tobacco Counseling Counseling given: Not Answered   Clinical Intake:  Pre-visit preparation completed: Yes  Pain : No/denies pain     Nutritional Risks: None Diabetes: No  How often do you need to have someone help you when you read instructions, pamphlets, or other written materials from your doctor or pharmacy?: 1 - Never What is the last grade level you completed in school?: Master  Diabetic?no   Interpreter Needed?: No  Information entered by :: [place] of Daily Living    11/04/2021    3:51 PM  In your present state of health, do you have any difficulty performing the following activities:  Hearing? 0  Vision? 0  Difficulty concentrating or making decisions? 0  Walking or climbing stairs? 0  Dressing or bathing? 0  Doing errands, shopping? 0  Preparing Food and eating ? N  Using the Toilet? N  In the past six months, have you accidently leaked urine? N  Do you have problems with loss of bowel control? N  Managing your Medications? N  Managing your Finances? N  Housekeeping or managing your Housekeeping? N    Patient Care Team: Plotnikov, Evie Lacks, MD as PCP - General Justice Britain, MD (Orthopedic Surgery) Annia Belt, MD (Hematology and Oncology) Molli Posey, MD (Obstetrics and Gynecology) Lafayette Dragon, MD (Inactive)  (Gastroenterology) Newman Pies, MD as Consulting Physician (Neurosurgery) Gaynelle Arabian, MD as Consulting Physician (Orthopedic Surgery) Paralee Cancel, MD as Consulting Physician (Orthopedic Surgery) Magrinat, Virgie Dad, MD (Inactive) as Consulting Physician (Oncology) Fanny Skates, MD as Consulting Physician (General Surgery) Woodroe Mode, MD as Consulting Physician (Obstetrics and Gynecology) Katy Apo, MD as Consulting Physician (Ophthalmology)  Indicate any recent Medical Services you may have received from other than [facility] providers in the past year (date may be approximate).     Assessment:   This is a routine wellness examination for [place].  Hearing/Vision screen Vision Screening - Comments:: Annual eye exams wears glasses   Dietary issues and exercise activities discussed: Current Exercise Habits: The patient does not participate in regular exercise at present;Home exercise routine, Type of exercise: walking, Time (Minutes): 10, Frequency (Times/Week): 5, Weekly Exercise (Minutes/Week): 50, Intensity: Mild, Exercise limited by: orthopedic condition(s)   Goals Addressed   None    Depression Screen    11/04/2021    3:50 PM 11/04/2021    3:48 PM 08/06/2021    1:43 PM 10/30/2020   11:01 AM 01/16/2020   10:32 AM 08/09/2018   10:17 AM 05/27/2017    1:44 PM  PHQ 2/9 Scores  PHQ - 2 Score 0 0 0 0 0 0 0    Fall Risk    11/04/2021    3:50 PM 08/06/2021    1:43 PM 10/30/2020   11:02 AM 01/16/2020   10:32 AM 08/09/2018   10:16 AM  [place] in the  past year? 0 0 0 0 0  Number falls in past yr: 0 0 0 0   Injury with Fall? 0 0 0 0   Risk for fall due to :   No Fall Risks    Follow up Falls evaluation completed;Education provided  Falls evaluation completed  Falls prevention discussed    FALL RISK PREVENTION PERTAINING TO THE HOME:  Any stairs in or around the home? No  If so, are there any without handrails? No  Home free of loose throw rugs in walkways,  pet beds, electrical cords, etc? Yes  Adequate lighting in your home to reduce risk of falls? Yes   ASSISTIVE DEVICES UTILIZED TO PREVENT FALLS:  Life alert? No  Use of a cane, walker or w/c? No  Grab bars in the bathroom? Yes  Shower chair or bench in shower? No  Elevated toilet seat or a handicapped toilet? No     Cognitive Function:    Normal cognitive status assessed by telephone conversation by this Nurse Health Advisor. No abnormalities found.      Immunizations Immunization History  Administered Date(s) Administered   Fluad Quad(high Dose 65+) 02/12/2019, 02/28/2020, 03/19/2021   Influenza Split 02/25/2011, 04/06/2012   Influenza Whole 03/27/2009, 04/24/2010   Influenza, High Dose Seasonal PF 04/13/2013, 04/19/2015, 03/21/2016, 04/15/2017, 03/05/2018   Influenza,inj,Quad PF,6+ Mos 03/16/2014   Moderna Covid-19 Vaccine Bivalent Booster 3yr & up 04/09/2021   Moderna SARS-COV2 Booster Vaccination 09/27/2020   Moderna Sars-Covid-2 Vaccination 06/27/2019, 07/25/2019, 04/06/2020   Pneumococcal Conjugate-13 06/29/2013   Pneumococcal Polysaccharide-23 09/20/2009   Td 04/25/2004, 07/13/2015   Zoster Recombinat (Shingrix) 04/25/2021, 07/17/2021   Zoster, Live 04/29/2007    TDAP status: Up to date  Flu Vaccine status: Up to date  Pneumococcal vaccine status: Up to date  Covid-19 vaccine status: Completed vaccines  Qualifies for Shingles Vaccine? Yes   Zostavax completed Yes   Shingrix Completed?: Yes  Screening Tests Health Maintenance  Topic Date Due   DEXA SCAN  Never done   INFLUENZA VACCINE  01/14/2022   TETANUS/TDAP  07/12/2025   Pneumonia Vaccine 88 Years old  Completed   COVID-19 Vaccine  Completed   Zoster Vaccines- Shingrix  Completed   HPV VACCINES  Aged Out    Health Maintenance  Health Maintenance Due  Topic Date Due   DEXA SCAN  Never done    Colorectal cancer screening: No longer required.   Mammogram status: No longer required due to  age.  Bone Density status: Ordered patient declined . Pt provided with contact info and advised to call to schedule appt.  Lung Cancer Screening: (Low Dose CT Chest recommended if Age 86-80years, 30 pack-year currently smoking OR have quit w/in 15years.) does not qualify.   Lung Cancer Screening Referral: n/a  Additional Screening:  Hepatitis C Screening: does not qualify;   Vision Screening: Recommended annual ophthalmology exams for early detection of glaucoma and other disorders of the eye. Is the patient up to date with their annual eye exam?  Yes  Who is the provider or what is the name of the office in which the patient attends annual eye exams? Dr,Lyles  If pt is not established with a provider, would they like to be referred to a provider to establish care? No .   Dental Screening: Recommended annual dental exams for proper oral hygiene  Community Resource Referral / Chronic Care Management: CRR required this visit?  No   CCM required this visit?  No  Plan:     I have personally reviewed and noted the following in the patient's chart:   Medical and social history Use of alcohol, tobacco or illicit drugs  Current medications and supplements including opioid prescriptions.  Functional ability and status Nutritional status Physical activity Advanced directives List of other physicians Hospitalizations, surgeries, and ER visits in previous 12 months Vitals Screenings to include cognitive, depression, and falls Referrals and appointments  In addition, I have reviewed and discussed with patient certain preventive protocols, quality metrics, and best practice recommendations. A written personalized care plan for preventive services as well as general preventive health recommendations were provided to patient.     Randel Pigg, LPN   8/41/2820   Nurse Notes: none     Medical screening examination/treatment/procedure(s) were performed by non-physician  practitioner and as supervising physician I was immediately available for consultation/collaboration.  I agree with above. Lew Dawes, MD

## 2021-11-04 NOTE — Patient Instructions (Signed)
Tracy Peters , Thank you for taking time to come for your Medicare Wellness Visit. I appreciate your ongoing commitment to your health goals. Please review the following plan we discussed and let me know if I can assist you in the future.   Screening recommendations/referrals: Colonoscopy: no longer required  Mammogram: no longer required  Bone Density: patient declined  Recommended yearly ophthalmology/optometry visit for glaucoma screening and checkup Recommended yearly dental visit for hygiene and checkup  Vaccinations: Influenza vaccine: completed  Pneumococcal vaccine: completed  Tdap vaccine: 07/13/2015 Shingles vaccine: completed     Advanced directives: yes   Conditions/risks identified: none   Next appointment: none    Preventive Care 80 Years and Older, Female Preventive care refers to lifestyle choices and visits with your health care provider that can promote health and wellness. What does preventive care include? A yearly physical exam. This is also called an annual well check. Dental exams once or twice a year. Routine eye exams. Ask your health care provider how often you should have your eyes checked. Personal lifestyle choices, including: Daily care of your teeth and gums. Regular physical activity. Eating a healthy diet. Avoiding tobacco and drug use. Limiting alcohol use. Practicing safe sex. Taking low-dose aspirin every day. Taking vitamin and mineral supplements as recommended by your health care provider. What happens during an annual well check? The services and screenings done by your health care provider during your annual well check will depend on your age, overall health, lifestyle risk factors, and family history of disease. Counseling  Your health care provider may ask you questions about your: Alcohol use. Tobacco use. Drug use. Emotional well-being. Home and relationship well-being. Sexual activity. Eating habits. History of falls. Memory  and ability to understand (cognition). Work and work Statistician. Reproductive health. Screening  You may have the following tests or measurements: Height, weight, and BMI. Blood pressure. Lipid and cholesterol levels. These may be checked every 5 years, or more frequently if you are over 54 years old. Skin check. Lung cancer screening. You may have this screening every year starting at age 73 if you have a 30-pack-year history of smoking and currently smoke or have quit within the past 15 years. Fecal occult blood test (FOBT) of the stool. You may have this test every year starting at age 12. Flexible sigmoidoscopy or colonoscopy. You may have a sigmoidoscopy every 5 years or a colonoscopy every 10 years starting at age 44. Hepatitis C blood test. Hepatitis B blood test. Sexually transmitted disease (STD) testing. Diabetes screening. This is done by checking your blood sugar (glucose) after you have not eaten for a while (fasting). You may have this done every 1-3 years. Bone density scan. This is done to screen for osteoporosis. You may have this done starting at age 42. Mammogram. This may be done every 1-2 years. Talk to your health care provider about how often you should have regular mammograms. Talk with your health care provider about your test results, treatment options, and if necessary, the need for more tests. Vaccines  Your health care provider may recommend certain vaccines, such as: Influenza vaccine. This is recommended every year. Tetanus, diphtheria, and acellular pertussis (Tdap, Td) vaccine. You may need a Td booster every 10 years. Zoster vaccine. You may need this after age 57. Pneumococcal 13-valent conjugate (PCV13) vaccine. One dose is recommended after age 43. Pneumococcal polysaccharide (PPSV23) vaccine. One dose is recommended after age 20. Talk to your health care provider about which screenings  and vaccines you need and how often you need them. This  information is not intended to replace advice given to you by your health care provider. Make sure you discuss any questions you have with your health care provider. Document Released: 06/29/2015 Document Revised: 02/20/2016 Document Reviewed: 04/03/2015 Elsevier Interactive Patient Education  2017 [place] Prevention in the Home Falls can cause injuries. They can happen to people of all ages. There are many things you can do to make your home safe and to help prevent falls. What can I do on the outside of my home? Regularly fix the edges of walkways and driveways and fix any cracks. Remove anything that might make you trip as you walk through a door, such as a raised step or threshold. Trim any bushes or trees on the path to your home. Use bright outdoor lighting. Clear any walking paths of anything that might make someone trip, such as rocks or tools. Regularly check to see if handrails are loose or broken. Make sure that both sides of any steps have handrails. Any raised decks and porches should have guardrails on the edges. Have any leaves, snow, or ice cleared regularly. Use sand or salt on walking paths during winter. Clean up any spills in your garage right away. This includes oil or grease spills. What can I do in the bathroom? Use night lights. Install grab bars by the toilet and in the tub and shower. Do not use towel bars as grab bars. Use non-skid mats or decals in the tub or shower. If you need to sit down in the shower, use a plastic, non-slip stool. Keep the floor dry. Clean up any water that spills on the floor as soon as it happens. Remove soap buildup in the tub or shower regularly. Attach bath mats securely with double-sided non-slip rug tape. Do not have throw rugs and other things on the floor that can make you trip. What can I do in the bedroom? Use night lights. Make sure that you have a light by your bed that is easy to reach. Do not use any sheets or  blankets that are too big for your bed. They should not hang down onto the floor. Have a firm chair that has side arms. You can use this for support while you get dressed. Do not have throw rugs and other things on the floor that can make you trip. What can I do in the kitchen? Clean up any spills right away. Avoid walking on wet floors. Keep items that you use a lot in easy-to-reach places. If you need to reach something above you, use a strong step stool that has a grab bar. Keep electrical cords out of the way. Do not use floor polish or wax that makes floors slippery. If you must use wax, use non-skid floor wax. Do not have throw rugs and other things on the floor that can make you trip. What can I do with my stairs? Do not leave any items on the stairs. Make sure that there are handrails on both sides of the stairs and use them. Fix handrails that are broken or loose. Make sure that handrails are as long as the stairways. Check any carpeting to make sure that it is firmly attached to the stairs. Fix any carpet that is loose or worn. Avoid having throw rugs at the top or bottom of the stairs. If you do have throw rugs, attach them to the floor with carpet tape.  Make sure that you have a light switch at the top of the stairs and the bottom of the stairs. If you do not have them, ask someone to add them for you. What else can I do to help prevent falls? Wear shoes that: Do not have high heels. Have rubber bottoms. Are comfortable and fit you well. Are closed at the toe. Do not wear sandals. If you use a stepladder: Make sure that it is fully opened. Do not climb a closed stepladder. Make sure that both sides of the stepladder are locked into place. Ask someone to hold it for you, if possible. Clearly mark and make sure that you can see: Any grab bars or handrails. First and last steps. Where the edge of each step is. Use tools that help you move around (mobility aids) if they are  needed. These include: Canes. Walkers. Scooters. Crutches. Turn on the lights when you go into a dark area. Replace any light bulbs as soon as they burn out. Set up your furniture so you have a clear path. Avoid moving your furniture around. If any of your floors are uneven, fix them. If there are any pets around you, be aware of where they are. Review your medicines with your doctor. Some medicines can make you feel dizzy. This can increase your chance of falling. Ask your doctor what other things that you can do to help prevent falls. This information is not intended to replace advice given to you by your health care provider. Make sure you discuss any questions you have with your health care provider. Document Released: 03/29/2009 Document Revised: 11/08/2015 Document Reviewed: 07/07/2014 Elsevier Interactive Patient Education  2017 [employer].

## 2021-11-06 ENCOUNTER — Ambulatory Visit (INDEPENDENT_AMBULATORY_CARE_PROVIDER_SITE_OTHER): Payer: Medicare Other

## 2021-11-06 ENCOUNTER — Encounter: Payer: Self-pay | Admitting: Internal Medicine

## 2021-11-06 ENCOUNTER — Ambulatory Visit (INDEPENDENT_AMBULATORY_CARE_PROVIDER_SITE_OTHER): Payer: Medicare Other | Admitting: Internal Medicine

## 2021-11-06 DIAGNOSIS — G8929 Other chronic pain: Secondary | ICD-10-CM | POA: Diagnosis not present

## 2021-11-06 DIAGNOSIS — M542 Cervicalgia: Secondary | ICD-10-CM

## 2021-11-06 DIAGNOSIS — M898X1 Other specified disorders of bone, shoulder: Secondary | ICD-10-CM

## 2021-11-06 DIAGNOSIS — I10 Essential (primary) hypertension: Secondary | ICD-10-CM

## 2021-11-06 DIAGNOSIS — D500 Iron deficiency anemia secondary to blood loss (chronic): Secondary | ICD-10-CM | POA: Diagnosis not present

## 2021-11-06 DIAGNOSIS — M19012 Primary osteoarthritis, left shoulder: Secondary | ICD-10-CM | POA: Diagnosis not present

## 2021-11-06 DIAGNOSIS — Z853 Personal history of malignant neoplasm of breast: Secondary | ICD-10-CM | POA: Insufficient documentation

## 2021-11-06 DIAGNOSIS — R569 Unspecified convulsions: Secondary | ICD-10-CM

## 2021-11-06 DIAGNOSIS — E785 Hyperlipidemia, unspecified: Secondary | ICD-10-CM | POA: Insufficient documentation

## 2021-11-06 LAB — COMPREHENSIVE METABOLIC PANEL
ALT: 21 U/L (ref 0–35)
AST: 27 U/L (ref 0–37)
Albumin: 4.3 g/dL (ref 3.5–5.2)
Alkaline Phosphatase: 58 U/L (ref 39–117)
BUN: 25 mg/dL — ABNORMAL HIGH (ref 6–23)
CO2: 25 mEq/L (ref 19–32)
Calcium: 9.7 mg/dL (ref 8.4–10.5)
Chloride: 99 mEq/L (ref 96–112)
Creatinine, Ser: 0.98 mg/dL (ref 0.40–1.20)
GFR: 52.37 mL/min — ABNORMAL LOW (ref >60.00)
Glucose, Bld: 92 mg/dL (ref 70–99)
Potassium: 4.7 mEq/L (ref 3.5–5.1)
Sodium: 133 mEq/L — ABNORMAL LOW (ref 135–145)
Total Bilirubin: 0.7 mg/dL (ref 0.2–1.2)
Total Protein: 7.6 g/dL (ref 6.0–8.3)

## 2021-11-06 LAB — CBC WITH DIFFERENTIAL/PLATELET
Basophils Absolute: 0 10*3/uL (ref 0.0–0.1)
Basophils Relative: 0.5 % (ref 0.0–3.0)
Eosinophils Absolute: 0.1 10*3/uL (ref 0.0–0.7)
Eosinophils Relative: 0.9 % (ref 0.0–5.0)
HCT: 38.3 % (ref 36.0–46.0)
Hemoglobin: 12.6 g/dL (ref 12.0–15.0)
Lymphocytes Relative: 29.7 % (ref 12.0–46.0)
Lymphs Abs: 2.1 10*3/uL (ref 0.7–4.0)
MCHC: 32.9 g/dL (ref 30.0–36.0)
MCV: 101 fl — ABNORMAL HIGH (ref 78.0–100.0)
Monocytes Absolute: 0.7 10*3/uL (ref 0.1–1.0)
Monocytes Relative: 10.1 % (ref 3.0–12.0)
Neutro Abs: 4.2 10*3/uL (ref 1.4–7.7)
Neutrophils Relative %: 58.8 % (ref 43.0–77.0)
Platelets: 276 10*3/uL (ref 150.0–400.0)
RBC: 3.79 Mil/uL — ABNORMAL LOW (ref 3.87–5.11)
RDW: 13.4 % (ref 11.5–15.5)
WBC: 7.1 10*3/uL (ref 4.0–10.5)

## 2021-11-06 LAB — TSH: TSH: 4.01 u[IU]/mL (ref 0.35–5.50)

## 2021-11-06 MED ORDER — ZOLPIDEM TARTRATE 10 MG PO TABS: ORAL_TABLET | ORAL | 1 refills | Status: DC

## 2021-11-06 MED ORDER — AMLODIPINE BESYLATE 2.5 MG PO TABS: 2.5000 mg | ORAL_TABLET | Freq: Every day | ORAL | 3 refills | Status: DC

## 2021-11-06 MED ORDER — LACOSAMIDE 50 MG PO TABS: 50.0000 mg | ORAL_TABLET | Freq: Two times a day (BID) | ORAL | 3 refills | Status: DC

## 2021-11-06 MED ORDER — TRAMADOL HCL 50 MG PO TABS: ORAL_TABLET | ORAL | 1 refills | Status: DC

## 2021-11-06 MED ORDER — LOSARTAN POTASSIUM 100 MG PO TABS: 100.0000 mg | ORAL_TABLET | Freq: Every day | ORAL | 3 refills | Status: DC

## 2021-11-06 NOTE — Assessment & Plan Note (Signed)
On Vimpat No relapse

## 2021-11-06 NOTE — Progress Notes (Signed)
Subjective:  Patient ID: Tracy Peters, female    DOB: [birth year]  Age: 88 y.o. MRN: [id number]  CC: Follow-up   HPI Gilberta Peeters presents for seizure disorder, HTN, insomnia C/o Medial L clavicle head w/pain x 6 wks  Outpatient Medications Prior to Visit  Medication Sig Dispense Refill   benzonatate (TESSALON) 100 MG capsule Take 1 capsule (100 mg total) by mouth every 8 (eight) hours. 21 capsule 1   Cholecalciferol (VITAMIN D3) 1.25 MG (50000 UT) CAPS Take 1 capsule every 14 days 6 capsule 3   cloNIDine (CATAPRES) 0.1 MG tablet Take 1 tablet (0.1 mg total) by mouth 2 (two) times daily. 180 tablet 3   denosumab (PROLIA) 60 MG/ML SOSY injection Prolia 60 mg/mL subcutaneous syringe     diphenoxylate-atropine (LOMOTIL) 2.5-0.025 MG tablet TAKE ONE TABLET BY MOUTH FOUR TIMES DAILY 120 tablet 1   doxycycline (VIBRA-TABS) 100 MG tablet Take 100 mg by mouth 2 (two) times daily.     EPINEPHrine 0.3 mg/0.3 mL IJ SOAJ injection USE AS DIRECTED (Patient taking differently: Inject 0.3 mg into the muscle as needed for anaphylaxis.) 2 Device 1   famotidine (PEPCID) 20 MG tablet Take 1 tablet (20 mg total) by mouth 2 (two) times daily. 30 tablet 3   ferrous sulfate 325 (65 FE) MG tablet Take 1 tablet (325 mg total) by mouth every other day.     metoprolol succinate (TOPROL-XL) 25 MG 24 hr tablet Take 1 tablet (25 mg total) by mouth daily. 90 tablet 3   ondansetron (ZOFRAN) 4 MG tablet Take 1 tablet (4 mg total) by mouth every 8 (eight) hours as needed for nausea or vomiting. 20 tablet 0   Probiotic Product (ALIGN PO) Take 1 capsule by mouth in the morning and at bedtime.      tobramycin-dexamethasone (TOBRADEX) ophthalmic solution Place 1 drop into both eyes at bedtime.     valACYclovir (VALTREX) 500 MG tablet Take 1 tablet (500 mg total) by mouth 2 (two) times daily. 180 tablet 3   warfarin (COUMADIN) 5 MG tablet TAKE 1 1/2 TABLETS BY MOUTH DAILY OR AS DIRECTED BY ANTICOAGULATION  CLINIC 135 tablet 1   amLODipine (NORVASC) 2.5 MG tablet Take 1 tablet (2.5 mg total) by mouth daily. 90 tablet 3   losartan (COZAAR) 100 MG tablet TAKE 1 TABLET BY MOUTH DAILY. 90 tablet 2   traMADol (ULTRAM) 50 MG tablet TAKE 1-2 TABLETS TWICE A DAY AS NEEDED FOR SEVERE PAIN. 270 tablet 1   VIMPAT 50 MG TABS tablet TAKE 1 TABLET TWICE A DAY. 60 tablet 5   zolpidem (AMBIEN) 10 MG tablet 1 & 1/2 TABLETS ('15MG'$ ) BY MOUTH AT BEDTIME AS NEEDED FOR SLEEP, OK TO REPEAT 1&1/2 IN 4HRS 270 tablet 1   No facility-administered medications prior to visit.    ROS: Review of Systems  Constitutional:  Positive for fatigue. Negative for activity change, appetite change, chills and unexpected weight change.  HENT:  Negative for congestion, mouth sores and sinus pressure.   Eyes:  Negative for visual disturbance.  Respiratory:  Negative for cough and chest tightness.   Gastrointestinal:  Negative for abdominal pain and nausea.  Genitourinary:  Negative for difficulty urinating, frequency and vaginal pain.  Musculoskeletal:  Negative for back pain and gait problem.  Skin:  Negative for pallor and rash.  Neurological:  Negative for dizziness, tremors, weakness, numbness and headaches.  Hematological:  Bruises/bleeds easily.  Psychiatric/Behavioral:  Positive for sleep disturbance. Negative for confusion and  suicidal ideas.    Objective:  BP 140/80   Pulse 67   Temp 98.4 F (36.9 C) (Oral)   Ht '5\' 4"'$  (1.626 m)   Wt 119 lb 6.4 oz (54.2 kg)   SpO2 98%   BMI 20.49 kg/m   BP Readings from Last 3 Encounters:  11/06/21 140/80  08/06/21 (!) 160/92  05/21/21 130/90    Wt Readings from Last 3 Encounters:  11/06/21 119 lb 6.4 oz (54.2 kg)  08/06/21 121 lb (54.9 kg)  05/21/21 116 lb (52.6 kg)    Physical Exam Constitutional:      General: She is not in acute distress.    Appearance: She is well-developed.  HENT:     Head: Normocephalic.     Right Ear: External ear normal.     Left Ear: External  ear normal.     Nose: Nose normal.  Eyes:     General:        Right eye: No discharge.        Left eye: No discharge.     Conjunctiva/sclera: Conjunctivae normal.     Pupils: Pupils are equal, round, and reactive to light.  Neck:     Thyroid: No thyromegaly.     Vascular: No JVD.     Trachea: No tracheal deviation.  Cardiovascular:     Rate and Rhythm: Normal rate and regular rhythm.     Heart sounds: Normal heart sounds.  Pulmonary:     Effort: No respiratory distress.     Breath sounds: No stridor. No wheezing.  Abdominal:     General: Bowel sounds are normal. There is no distension.     Palpations: Abdomen is soft. There is no mass.     Tenderness: There is no abdominal tenderness. There is no guarding or rebound.  Musculoskeletal:        General: Tenderness present.     Cervical back: Normal range of motion and neck supple. No rigidity.  Lymphadenopathy:     Cervical: No cervical adenopathy.  Skin:    Findings: No erythema or rash.  Neurological:     Cranial Nerves: No cranial nerve deficit.     Motor: No abnormal muscle tone.     Coordination: Coordination abnormal.     Gait: Gait abnormal.     Deep Tendon Reflexes: Reflexes normal.  Psychiatric:        Behavior: Behavior normal.        Thought Content: Thought content normal.        Judgment: Judgment normal.  A little ataxic Medial L clavicle head w/pain   Lab Results  Component Value Date   WBC 6.1 05/17/2021   HGB 12.1 05/17/2021   HCT 36.3 05/17/2021   PLT 244 05/17/2021   GLUCOSE 90 08/06/2021   CHOL 196 01/03/2016   TRIG 85.0 01/03/2016   HDL 63.10 01/03/2016   LDLDIRECT 129.8 05/31/2012   LDLCALC 116 (H) 01/03/2016   ALT 27 08/06/2021   AST 25 08/06/2021   NA 130 (L) 08/06/2021   K 4.2 08/06/2021   CL 97 08/06/2021   CREATININE 0.91 08/06/2021   BUN 14 08/06/2021   CO2 28 08/06/2021   TSH 2.14 03/19/2021   INR 2.2 10/29/2021    DG Chest 2 View  Result Date: 05/17/2021 CLINICAL DATA:   Chest pain EXAM: CHEST - 2 VIEW COMPARISON:  Chest x-ray 01/16/2020 FINDINGS: Heart size and mediastinal contours are within normal limits. No suspicious pulmonary opacities identified. No pleural effusion  or pneumothorax visualized. No acute osseous abnormality appreciated. IMPRESSION: No acute intrathoracic process identified. Electronically Signed   By: Ofilia Neas M.D.   On: 05/17/2021 13:28   MR ANGIO CHEST W WO CONTRAST  Result Date: 05/18/2021 CLINICAL DATA:  Thoracic aortic aneurysm, rule out dissection EXAM: MRA CHEST WITH OR WITHOUT CONTRAST TECHNIQUE: Angiographic images of the chest were obtained using MRA technique with intravenous contrast. CONTRAST:  58m GADAVIST GADOBUTROL 1 MMOL/ML IV SOLN COMPARISON:  None available FINDINGS: Cardiovascular: Heart size normal. No pericardial effusion. The RV and central pulmonary arteries are unremarkable. Turbulence noted just distal to the aortic valve. Aortic Root: --Valve: 2.4 cm --Sinuses: 3.1 cm --Sinotubular Junction: 2.6 cm Limitations by motion: Mild Thoracic Aorta: --Ascending Aorta: 3.4 cm --Aortic Arch: 3.1 cm --Descending Aorta: 2.6 cm No evidence of aortic dissection or stenosis. Classic 3 vessel brachiocephalic arterial origin anatomy without proximal stenosis. Visualized proximal abdominal aorta unremarkable. Mediastinum/Nodes: No mass or adenopathy. Lungs/Pleura: No pleural effusion.  No pulmonary mass evident. Upper Abdomen: Diffuse narrowing of the left renal artery. Left renal parenchymal atrophy. Musculoskeletal: No chest wall abnormality. No acute or significant osseous findings. Spinal cord: Limited evaluation, unremarkable. IMPRESSION: 1. 3.1 cm thoracic aortic arch aneurysm without complicating features. Recommend annual imaging followup by CTA or MRA. This recommendation follows 2010 ACCF/AHA/AATS/ACR/ASA/SCA/SCAI/SIR/STS/SVM Guidelines for the Diagnosis and Management of Patients with Thoracic Aortic Disease. Circulation.2010;  121:: I347-Q259 Aortic aneurysm NOS (ICD10-I71.9) 2. Long segment stenosis of the left renal artery with left renal parenchymal atrophy incidentally noted. Electronically Signed   By: DLucrezia EuropeM.D.   On: 05/18/2021 11:19    Assessment & Plan:   Problem List Items Addressed This Visit     Essential hypertension     Continue on Losartan, Norvasc. Clonidine prn high BP      Relevant Medications   amLODipine (NORVASC) 2.5 MG tablet   losartan (COZAAR) 100 MG tablet   Other Relevant Orders   Comprehensive metabolic panel   TSH   Chronic neck pain    Continue on Tramadol prn - was tolerating ok  Potential benefits of a long term opioids use as well as potential risks (i.e. addiction risk, apnea etc) and complications (i.e. Somnolence, constipation and others) were explained to the patient and were aknowledged.        Relevant Medications   lacosamide (VIMPAT) 50 MG TABS tablet   traMADol (ULTRAM) 50 MG tablet   Anemia, iron deficiency    Monitor CBC       Relevant Orders   CBC with Differential/Platelet   Comprehensive metabolic panel   TSH   Seizure (HCC)    On Vimpat No relapse      Relevant Medications   lacosamide (VIMPAT) 50 MG TABS tablet   zolpidem (AMBIEN) 10 MG tablet   Other Relevant Orders   TSH   Clavicle pain    Medial L clavicle head w/pain, no mass - new ?OA Will X ray      Relevant Orders   DG Clavicle Left      Meds ordered this encounter  Medications   lacosamide (VIMPAT) 50 MG TABS tablet    Sig: Take 1 tablet (50 mg total) by mouth 2 (two) times daily.    Dispense:  180 tablet    Refill:  3   amLODipine (NORVASC) 2.5 MG tablet    Sig: Take 1 tablet (2.5 mg total) by mouth daily.    Dispense:  90 tablet    Refill:  3   losartan (COZAAR) 100 MG tablet    Sig: Take 1 tablet (100 mg total) by mouth daily.    Dispense:  90 tablet    Refill:  3   traMADol (ULTRAM) 50 MG tablet    Sig: TAKE 1-2 TABLETS TWICE A DAY AS NEEDED FOR SEVERE  PAIN.    Dispense:  270 tablet    Refill:  1   zolpidem (AMBIEN) 10 MG tablet    Sig: 1 & 1/2 TABLETS ('15MG'$ ) BY MOUTH AT BEDTIME AS NEEDED FOR SLEEP, OK TO REPEAT 1&1/2 IN 4HRS    Dispense:  270 tablet    Refill:  1    Medial L clavicle head w/pain, no mass  Follow-up: Return in about 6 months (around 05/09/2022) for Wellness Exam.  [name], MD

## 2021-11-06 NOTE — Assessment & Plan Note (Signed)
Continue on Tramadol prn - was tolerating ok  Potential benefits of a long term opioids use as well as potential risks (i.e. addiction risk, apnea etc) and complications (i.e. Somnolence, constipation and others) were explained to the patient and were aknowledged.

## 2021-11-06 NOTE — Assessment & Plan Note (Signed)
Monitor CBC 

## 2021-11-06 NOTE — Assessment & Plan Note (Signed)
Medial L clavicle head w/pain, no mass - new ?OA Will X ray

## 2021-11-06 NOTE — Assessment & Plan Note (Signed)
  Continue on Losartan, Norvasc. Clonidine prn high BP 

## 2021-11-20 DIAGNOSIS — S81802A Unspecified open wound, left lower leg, initial encounter: Secondary | ICD-10-CM | POA: Diagnosis not present

## 2021-12-10 ENCOUNTER — Ambulatory Visit (INDEPENDENT_AMBULATORY_CARE_PROVIDER_SITE_OTHER): Payer: Medicare Other

## 2021-12-10 DIAGNOSIS — Z7901 Long term (current) use of anticoagulants: Secondary | ICD-10-CM

## 2021-12-10 LAB — POCT INR: INR: 2 (ref 2.0–3.0)

## 2021-12-10 NOTE — Progress Notes (Addendum)
Continue 1 tablet daily except take 1 1/2 tablets on Sun and Thurs. Recheck in 6 weeks.  Medical screening examination/treatment/procedure(s) were performed by non-physician practitioner and as supervising physician I was immediately available for consultation/collaboration.  I agree with above. Aleksei Plotnikov, MD 

## 2021-12-20 DIAGNOSIS — C44329 Squamous cell carcinoma of skin of other parts of face: Secondary | ICD-10-CM | POA: Diagnosis not present

## 2021-12-20 DIAGNOSIS — D485 Neoplasm of uncertain behavior of skin: Secondary | ICD-10-CM | POA: Diagnosis not present

## 2021-12-20 DIAGNOSIS — S81802A Unspecified open wound, left lower leg, initial encounter: Secondary | ICD-10-CM | POA: Diagnosis not present

## 2022-01-10 ENCOUNTER — Other Ambulatory Visit: Payer: Self-pay | Admitting: Internal Medicine

## 2022-01-10 DIAGNOSIS — H04123 Dry eye syndrome of bilateral lacrimal glands: Secondary | ICD-10-CM | POA: Diagnosis not present

## 2022-01-10 DIAGNOSIS — H524 Presbyopia: Secondary | ICD-10-CM | POA: Diagnosis not present

## 2022-01-10 DIAGNOSIS — Z961 Presence of intraocular lens: Secondary | ICD-10-CM | POA: Diagnosis not present

## 2022-01-10 DIAGNOSIS — H01001 Unspecified blepharitis right upper eyelid: Secondary | ICD-10-CM | POA: Diagnosis not present

## 2022-01-15 ENCOUNTER — Other Ambulatory Visit: Payer: Self-pay | Admitting: Internal Medicine

## 2022-01-17 DIAGNOSIS — D0439 Carcinoma in situ of skin of other parts of face: Secondary | ICD-10-CM | POA: Diagnosis not present

## 2022-01-17 DIAGNOSIS — S81802A Unspecified open wound, left lower leg, initial encounter: Secondary | ICD-10-CM | POA: Diagnosis not present

## 2022-01-21 ENCOUNTER — Ambulatory Visit (INDEPENDENT_AMBULATORY_CARE_PROVIDER_SITE_OTHER): Payer: Medicare Other

## 2022-01-21 DIAGNOSIS — Z7901 Long term (current) use of anticoagulants: Secondary | ICD-10-CM | POA: Diagnosis not present

## 2022-01-21 LAB — POCT INR: INR: 2.1 (ref 2.0–3.0)

## 2022-01-21 NOTE — Progress Notes (Cosign Needed Addendum)
Continue 1 tablet daily except take 1 1/2 tablets on Sun and Thurs. Recheck in 6 weeks.  Medical screening examination/treatment/procedure(s) were performed by non-physician practitioner and as supervising physician I was immediately available for consultation/collaboration.  I agree with above. Lew Dawes, MD

## 2022-01-21 NOTE — Patient Instructions (Addendum)
Pre visit review using our clinic review tool, if applicable. No additional management support is needed unless otherwise documented below in the visit note.  Continue 1 tablet daily except take 1 1/2 tablets on Sun and Thurs. Recheck in 6 weeks.

## 2022-02-24 ENCOUNTER — Other Ambulatory Visit: Payer: Self-pay | Admitting: Internal Medicine

## 2022-02-24 DIAGNOSIS — Z7901 Long term (current) use of anticoagulants: Secondary | ICD-10-CM

## 2022-02-25 NOTE — Telephone Encounter (Signed)
Patient compliant with warfarin management and PCP appointments. Refill sent.

## 2022-03-04 ENCOUNTER — Ambulatory Visit (INDEPENDENT_AMBULATORY_CARE_PROVIDER_SITE_OTHER): Payer: Medicare Other

## 2022-03-04 DIAGNOSIS — Z7901 Long term (current) use of anticoagulants: Secondary | ICD-10-CM

## 2022-03-04 LAB — POCT INR: INR: 3.5 — AB (ref 2.0–3.0)

## 2022-03-04 NOTE — Patient Instructions (Addendum)
Hold today's dose. Then change weekly dose to 1 tablet daily except take 1 1/2 tablets on Thursdays. Recheck in 3 weeks.

## 2022-03-04 NOTE — Progress Notes (Addendum)
Pt reports eating less greens, denies any other diet changes. Also reports slight bilateral lower extremity edema.   Hold today's dose. Continue 1 tablet daily except take 1 1/2 tablets on Thursdays. Recheck in 3 weeks.  Medical screening examination/treatment/procedure(s) were performed by non-physician practitioner and as supervising physician I was immediately available for consultation/collaboration.  I agree with above. Lew Dawes, MD

## 2022-03-05 ENCOUNTER — Other Ambulatory Visit: Payer: Self-pay | Admitting: Obstetrics and Gynecology

## 2022-03-05 DIAGNOSIS — N644 Mastodynia: Secondary | ICD-10-CM

## 2022-03-07 DIAGNOSIS — S81802A Unspecified open wound, left lower leg, initial encounter: Secondary | ICD-10-CM | POA: Diagnosis not present

## 2022-03-12 ENCOUNTER — Ambulatory Visit
Admission: RE | Admit: 2022-03-12 | Discharge: 2022-03-12 | Disposition: A | Discharge status: Home / Self Care | Payer: Medicare Other | Source: Ambulatory Visit | Attending: Obstetrics and Gynecology | Admitting: Obstetrics and Gynecology

## 2022-03-12 DIAGNOSIS — N644 Mastodynia: Secondary | ICD-10-CM

## 2022-03-12 DIAGNOSIS — R922 Inconclusive mammogram: Secondary | ICD-10-CM | POA: Diagnosis not present

## 2022-03-18 ENCOUNTER — Encounter: Payer: Self-pay | Admitting: Internal Medicine

## 2022-03-22 ENCOUNTER — Encounter: Payer: Self-pay | Admitting: Internal Medicine

## 2022-03-25 ENCOUNTER — Ambulatory Visit: Payer: Medicare Other

## 2022-03-28 ENCOUNTER — Telehealth: Payer: Self-pay

## 2022-03-28 NOTE — Telephone Encounter (Signed)
Called to check in on pt. Missed [facility] appt on 10/10.  Rescheduled to 10/17.

## 2022-04-01 ENCOUNTER — Ambulatory Visit (INDEPENDENT_AMBULATORY_CARE_PROVIDER_SITE_OTHER): Payer: Medicare Other

## 2022-04-01 DIAGNOSIS — Z7901 Long term (current) use of anticoagulants: Secondary | ICD-10-CM | POA: Diagnosis not present

## 2022-04-01 LAB — POCT INR: INR: 1.7 — AB (ref 2.0–3.0)

## 2022-04-01 NOTE — Progress Notes (Addendum)
Increase today's dose to 1 1/2 tablets. Then continue to take 1 tablet daily except take 1 1/2 tablets on Thursdays. Recheck in 3 weeks.    Medical screening examination/treatment/procedure(s) were performed by non-physician practitioner and as supervising physician I was immediately available for consultation/collaboration.  I agree with above. Lew Dawes, MD

## 2022-04-01 NOTE — Patient Instructions (Signed)
Increase today's dose to 1 1/2 tablets. Then continue to take 1 tablet daily except take 1 1/2 tablets on Thursdays. Recheck in 3 weeks.

## 2022-04-09 DIAGNOSIS — S81802A Unspecified open wound, left lower leg, initial encounter: Secondary | ICD-10-CM | POA: Diagnosis not present

## 2022-04-14 DIAGNOSIS — L810 Postinflammatory hyperpigmentation: Secondary | ICD-10-CM | POA: Diagnosis not present

## 2022-04-14 DIAGNOSIS — D1801 Hemangioma of skin and subcutaneous tissue: Secondary | ICD-10-CM | POA: Diagnosis not present

## 2022-04-14 DIAGNOSIS — Z08 Encounter for follow-up examination after completed treatment for malignant neoplasm: Secondary | ICD-10-CM | POA: Diagnosis not present

## 2022-04-14 DIAGNOSIS — L814 Other melanin hyperpigmentation: Secondary | ICD-10-CM | POA: Diagnosis not present

## 2022-04-14 DIAGNOSIS — L570 Actinic keratosis: Secondary | ICD-10-CM | POA: Diagnosis not present

## 2022-04-14 DIAGNOSIS — Z86007 Personal history of in-situ neoplasm of skin: Secondary | ICD-10-CM | POA: Diagnosis not present

## 2022-04-14 DIAGNOSIS — D225 Melanocytic nevi of trunk: Secondary | ICD-10-CM | POA: Diagnosis not present

## 2022-04-14 DIAGNOSIS — L821 Other seborrheic keratosis: Secondary | ICD-10-CM | POA: Diagnosis not present

## 2022-04-22 ENCOUNTER — Ambulatory Visit (INDEPENDENT_AMBULATORY_CARE_PROVIDER_SITE_OTHER): Payer: Medicare Other

## 2022-04-22 ENCOUNTER — Ambulatory Visit: Payer: Medicare Other

## 2022-04-22 DIAGNOSIS — Z7901 Long term (current) use of anticoagulants: Secondary | ICD-10-CM | POA: Diagnosis not present

## 2022-04-22 LAB — POCT INR: INR: 1.4 — AB (ref 2.0–3.0)

## 2022-04-22 NOTE — Patient Instructions (Signed)
Increase today's dose to 1 1/2 tablets and increase dose tomorrow to 1 1/2 tablets.   Then change weekly dose to 1 tablet daily except take 1 1/2 tablets on Tuesdays and Thursdays. Recheck in 3 weeks, on 05/13/22 at 2:00.

## 2022-04-22 NOTE — Progress Notes (Addendum)
Increase today's dose to 1 1/2 tablets and increase dose tomorrow to 1 1/2 tablets.   Then change weekly dose to 1 tablet daily except take 1 1/2 tablets on Tuesdays and Thursdays. Recheck in 3 weeks per pt request.     Medical screening examination/treatment/procedure(s) were performed by non-physician practitioner and as supervising physician I was immediately available for consultation/collaboration.  I agree with above. Lew Dawes, MD

## 2022-05-12 ENCOUNTER — Other Ambulatory Visit: Payer: Self-pay | Admitting: Internal Medicine

## 2022-05-13 ENCOUNTER — Ambulatory Visit (INDEPENDENT_AMBULATORY_CARE_PROVIDER_SITE_OTHER): Payer: Medicare Other

## 2022-05-13 DIAGNOSIS — Z7901 Long term (current) use of anticoagulants: Secondary | ICD-10-CM

## 2022-05-13 LAB — POCT INR: INR: 2.1 (ref 2.0–3.0)

## 2022-05-13 NOTE — Patient Instructions (Signed)
Continue 1 tablet daily except take 1 1/2 tablets on Tuesdays and Thursdays. Recheck on 06/20/22 at 2:00.

## 2022-05-13 NOTE — Progress Notes (Cosign Needed)
Continue 1 tablet daily except take 1 1/2 tablets on Tuesdays and Thursdays. Recheck in 5 weeks per pt request.   Medical screening examination/treatment/procedure(s) were performed by non-physician practitioner and as supervising physician I was immediately available for consultation/collaboration.  I agree with above. Lew Dawes, MD

## 2022-05-26 ENCOUNTER — Other Ambulatory Visit: Payer: Self-pay | Admitting: Internal Medicine

## 2022-06-20 ENCOUNTER — Ambulatory Visit (INDEPENDENT_AMBULATORY_CARE_PROVIDER_SITE_OTHER): Payer: Medicare Other

## 2022-06-20 DIAGNOSIS — Z7901 Long term (current) use of anticoagulants: Secondary | ICD-10-CM

## 2022-06-20 LAB — POCT INR: INR: 1.6 — AB (ref 2.0–3.0)

## 2022-06-20 NOTE — Patient Instructions (Addendum)
Pre visit review using our clinic review tool, if applicable. No additional management support is needed unless otherwise documented below in the visit note.  Increase dose to take 1 1/2 tablets today and increase dose tomorrow to take 1 1/2 tablets and then continue 1 tablet daily except take 1 1/2 tablets on Tuesdays and Thursdays. Recheck in 3 weeks.

## 2022-06-20 NOTE — Progress Notes (Cosign Needed Addendum)
Pt unsure what may have caused the subtherapeutic INR today. Discussed medication and diet. Pt denies any changes.  Increase dose to take 1 1/2 tablets today and increase dose tomorrow to take 1 1/2 tablets and then continue 1 tablet daily except take 1 1/2 tablets on Tuesdays and Thursdays. Recheck in 3 weeks.  Medical screening examination/treatment/procedure(s) were performed by non-physician practitioner and as supervising physician I was immediately available for consultation/collaboration.  I agree with above. Lew Dawes, MD

## 2022-06-23 ENCOUNTER — Encounter: Payer: Self-pay | Admitting: Internal Medicine

## 2022-06-23 ENCOUNTER — Ambulatory Visit (INDEPENDENT_AMBULATORY_CARE_PROVIDER_SITE_OTHER): Payer: Medicare Other | Admitting: Internal Medicine

## 2022-06-23 VITALS — BP 124/78 | HR 70 | Temp 98.0°F | Ht 64.0 in | Wt 123.0 lb

## 2022-06-23 DIAGNOSIS — R569 Unspecified convulsions: Secondary | ICD-10-CM | POA: Diagnosis not present

## 2022-06-23 DIAGNOSIS — D500 Iron deficiency anemia secondary to blood loss (chronic): Secondary | ICD-10-CM | POA: Diagnosis not present

## 2022-06-23 DIAGNOSIS — E782 Mixed hyperlipidemia: Secondary | ICD-10-CM | POA: Diagnosis not present

## 2022-06-23 DIAGNOSIS — R197 Diarrhea, unspecified: Secondary | ICD-10-CM

## 2022-06-23 DIAGNOSIS — I10 Essential (primary) hypertension: Secondary | ICD-10-CM | POA: Diagnosis not present

## 2022-06-23 DIAGNOSIS — E785 Hyperlipidemia, unspecified: Secondary | ICD-10-CM

## 2022-06-23 LAB — CBC WITH DIFFERENTIAL/PLATELET
Basophils Absolute: 0 10*3/uL (ref 0.0–0.1)
Basophils Relative: 0.5 % (ref 0.0–3.0)
Eosinophils Absolute: 0.1 10*3/uL (ref 0.0–0.7)
Eosinophils Relative: 0.8 % (ref 0.0–5.0)
HCT: 35.1 % — ABNORMAL LOW (ref 36.0–46.0)
Hemoglobin: 11.6 g/dL — ABNORMAL LOW (ref 12.0–15.0)
Lymphocytes Relative: 21.9 % (ref 12.0–46.0)
Lymphs Abs: 1.8 10*3/uL (ref 0.7–4.0)
MCHC: 33 g/dL (ref 30.0–36.0)
MCV: 101.5 fl — ABNORMAL HIGH (ref 78.0–100.0)
Monocytes Absolute: 0.9 10*3/uL (ref 0.1–1.0)
Monocytes Relative: 10.6 % (ref 3.0–12.0)
Neutro Abs: 5.4 10*3/uL (ref 1.4–7.7)
Neutrophils Relative %: 66.2 % (ref 43.0–77.0)
Platelets: 233 10*3/uL (ref 150.0–400.0)
RBC: 3.46 Mil/uL — ABNORMAL LOW (ref 3.87–5.11)
RDW: 14 % (ref 11.5–15.5)
WBC: 8.2 10*3/uL (ref 4.0–10.5)

## 2022-06-23 LAB — URINALYSIS
Bilirubin Urine: NEGATIVE
Ketones, ur: NEGATIVE
Leukocytes,Ua: NEGATIVE
Nitrite: NEGATIVE
Specific Gravity, Urine: 1.01 (ref 1.000–1.030)
Total Protein, Urine: NEGATIVE
Urine Glucose: NEGATIVE
Urobilinogen, UA: 0.2 (ref 0.0–1.0)
pH: 6 (ref 5.0–8.0)

## 2022-06-23 LAB — COMPREHENSIVE METABOLIC PANEL
ALT: 7 U/L (ref 0–35)
AST: 18 U/L (ref 0–37)
Albumin: 3.9 g/dL (ref 3.5–5.2)
Alkaline Phosphatase: 45 U/L (ref 39–117)
BUN: 16 mg/dL (ref 6–23)
CO2: 28 mEq/L (ref 19–32)
Calcium: 9.1 mg/dL (ref 8.4–10.5)
Chloride: 96 mEq/L (ref 96–112)
Creatinine, Ser: 0.92 mg/dL (ref 0.40–1.20)
GFR: 56.25 mL/min — ABNORMAL LOW (ref >60.00)
Glucose, Bld: 91 mg/dL (ref 70–99)
Potassium: 4.9 mEq/L (ref 3.5–5.1)
Sodium: 130 mEq/L — ABNORMAL LOW (ref 135–145)
Total Bilirubin: 0.8 mg/dL (ref 0.2–1.2)
Total Protein: 7 g/dL (ref 6.0–8.3)

## 2022-06-23 LAB — LIPID PANEL
Cholesterol: 179 mg/dL (ref 0–200)
HDL: 49.4 mg/dL (ref >39.00)
LDL Cholesterol: 111 mg/dL — ABNORMAL HIGH (ref 0–99)
NonHDL: 129.78
Total CHOL/HDL Ratio: 4
Triglycerides: 96 mg/dL (ref 0.0–149.0)
VLDL: 19.2 mg/dL (ref 0.0–40.0)

## 2022-06-23 LAB — TSH: TSH: 2.5 u[IU]/mL (ref 0.35–5.50)

## 2022-06-23 MED ORDER — FLUTICASONE PROPIONATE 50 MCG/ACT NA SUSP: 2.0000 | Freq: Every day | NASAL | 3 refills | Status: DC

## 2022-06-23 MED ORDER — ZOLPIDEM TARTRATE 10 MG PO TABS: ORAL_TABLET | ORAL | 1 refills | Status: DC

## 2022-06-23 MED ORDER — AMLODIPINE BESYLATE 2.5 MG PO TABS: 2.5000 mg | ORAL_TABLET | Freq: Every day | ORAL | 3 refills | Status: DC

## 2022-06-23 MED ORDER — METOPROLOL SUCCINATE ER 25 MG PO TB24: 25.0000 mg | ORAL_TABLET | Freq: Every day | ORAL | 3 refills | Status: DC

## 2022-06-23 MED ORDER — TRAMADOL HCL 50 MG PO TABS: ORAL_TABLET | ORAL | 1 refills | Status: DC

## 2022-06-23 MED ORDER — TRIAMCINOLONE ACETONIDE 0.5 % EX CREA: 1.0000 | TOPICAL_CREAM | Freq: Three times a day (TID) | CUTANEOUS | 1 refills | Status: DC

## 2022-06-23 MED ORDER — LACOSAMIDE 50 MG PO TABS: 50.0000 mg | ORAL_TABLET | Freq: Two times a day (BID) | ORAL | 3 refills | Status: DC

## 2022-06-23 MED ORDER — DIPHENOXYLATE-ATROPINE 2.5-0.025 MG PO TABS: 1.0000 | ORAL_TABLET | Freq: Four times a day (QID) | ORAL | 1 refills | Status: DC

## 2022-06-23 NOTE — Assessment & Plan Note (Signed)
On Vimpat

## 2022-06-23 NOTE — Assessment & Plan Note (Signed)
On lomotil prn

## 2022-06-23 NOTE — Assessment & Plan Note (Signed)
Continue on Losartan, Norvasc. Clonidine prn high BP

## 2022-06-23 NOTE — Addendum Note (Signed)
Addended by: Cassandria Anger on: 06/23/2022 04:11 PM   Modules accepted: Orders

## 2022-06-23 NOTE — Assessment & Plan Note (Signed)
  On diet  

## 2022-06-23 NOTE — Progress Notes (Signed)
Subjective:  Patient ID: Tracy Peters, female    DOB: 1933-12-23  Age: 89 y.o. MRN: [id number]  CC: Follow-up (Hit right elbow 3 weeks ago is tender and still has pain )   HPI Tracy Peters presents for R elbow pain x 3 wks ago - better  F/u HTN, LBP, allergies  Outpatient Medications Prior to Visit  Medication Sig Dispense Refill   Cholecalciferol (VITAMIN D3) 1.25 MG (50000 UT) CAPS Take 1 capsule every 14 days 6 capsule 3   cloNIDine (CATAPRES) 0.1 MG tablet Take 1 tablet (0.1 mg total) by mouth 2 (two) times daily. 180 tablet 3   denosumab (PROLIA) 60 MG/ML SOSY injection Prolia 60 mg/mL subcutaneous syringe     EPINEPHrine 0.3 mg/0.3 mL IJ SOAJ injection USE AS DIRECTED (Patient taking differently: Inject 0.3 mg into the muscle as needed for anaphylaxis.) 2 Device 1   famotidine (PEPCID) 20 MG tablet Take 1 tablet (20 mg total) by mouth 2 (two) times daily. 30 tablet 3   ferrous sulfate 325 (65 FE) MG tablet Take 1 tablet (325 mg total) by mouth every other day.     losartan (COZAAR) 100 MG tablet Take 1 tablet (100 mg total) by mouth daily. 90 tablet 3   metroNIDAZOLE (METROGEL) 0.75 % vaginal gel INSERT ONE APPLICATORFUL VAGINALLY AT BEDTIME FOR 5 DAYS     nitrofurantoin, macrocrystal-monohydrate, (MACROBID) 100 MG capsule TAKE ONE CAPSULE BY MOUTH TWICE DAILY FOR 7 DAYS     ondansetron (ZOFRAN) 4 MG tablet Take 1 tablet (4 mg total) by mouth every 8 (eight) hours as needed for nausea or vomiting. 20 tablet 0   Probiotic Product (ALIGN PO) Take 1 capsule by mouth in the morning and at bedtime.      tobramycin-dexamethasone (TOBRADEX) ophthalmic solution Place 1 drop into both eyes at bedtime.     valACYclovir (VALTREX) 500 MG tablet Take 1 tablet (500 mg total) by mouth 2 (two) times daily. 180 tablet 3   warfarin (COUMADIN) 5 MG tablet TAKE 1 TABLET BY MOUTH DAILY EXCEPT TAKE 1 1/2 TABLETS ON SUNDAYS AND THURSDAYS OR AS DIRECTED BY ANTICOAGULATION CLINIC 110  tablet 1   amLODipine (NORVASC) 2.5 MG tablet Take 1 tablet (2.5 mg total) by mouth daily. 90 tablet 3   azithromycin (ZITHROMAX) 250 MG tablet      benzonatate (TESSALON) 100 MG capsule Take 1 capsule (100 mg total) by mouth every 8 (eight) hours. 21 capsule 1   colesevelam (WELCHOL) 625 MG tablet      diphenoxylate-atropine (LOMOTIL) 2.5-0.025 MG tablet TAKE ONE TABLET BY MOUTH FOUR TIMES DAILY 120 tablet 1   divalproex (DEPAKOTE) 250 MG DR tablet      doxycycline (VIBRA-TABS) 100 MG tablet Take 100 mg by mouth 2 (two) times daily.     FLUAD QUADRIVALENT 0.5 ML injection Inject 0.5 mLs into the muscle once.     fluticasone (CUTIVATE) 0.05 % cream apply TWICE DAILY TO affected AREA AS NEEDED FOR irritation     lacosamide (VIMPAT) 50 MG TABS tablet Take 1 tablet (50 mg total) by mouth 2 (two) times daily. Overdue for follow-up appt must see MD for refills 60 tablet 0   methocarbamol (ROBAXIN) 500 MG tablet as needed.     metoprolol succinate (TOPROL-XL) 25 MG 24 hr tablet Take 1 tablet (25 mg total) by mouth daily. 90 tablet 3   traMADol (ULTRAM) 50 MG tablet TAKE 1-2 TABLETS TWICE A DAY AS NEEDED FOR SEVERE PAIN. [place]  tablet 1   zolpidem (AMBIEN) 10 MG tablet 1 & 1/2 TABLETS ('15MG'$ ) BY MOUTH AT BEDTIME AS NEEDED FOR SLEEP, OK TO REPEAT 1&1/2 IN 4HRS 270 tablet 1   No facility-administered medications prior to visit.    ROS: Review of Systems  Constitutional:  Negative for activity change, appetite change, chills, fatigue and unexpected weight change.  HENT:  Negative for congestion, mouth sores and sinus pressure.   Eyes:  Negative for visual disturbance.  Respiratory:  Negative for cough and chest tightness.   Gastrointestinal:  Negative for abdominal pain and nausea.  Genitourinary:  Negative for difficulty urinating, frequency and vaginal pain.  Musculoskeletal:  Negative for back pain and gait problem.  Skin:  Negative for pallor and rash.  Neurological:  Negative for dizziness,  tremors, weakness, numbness and headaches.  Psychiatric/Behavioral:  Negative for confusion, sleep disturbance and suicidal ideas.     Objective:  BP 124/78 (BP Location: Right Arm, Patient Position: Sitting, Cuff Size: Normal)   Pulse 70   Temp 98 F (36.7 C) (Oral)   Ht '5\' 4"'$  (1.626 m)   Wt 123 lb (55.8 kg)   SpO2 100%   BMI 21.11 kg/m   BP Readings from Last 3 Encounters:  06/23/22 124/78  11/06/21 140/80  08/06/21 (!) 160/92    Wt Readings from Last 3 Encounters:  06/23/22 123 lb (55.8 kg)  11/06/21 119 lb 6.4 oz (54.2 kg)  08/06/21 121 lb (54.9 kg)    Physical Exam Constitutional:      General: She is not in acute distress.    Appearance: She is well-developed.  HENT:     Head: Normocephalic.     Right Ear: External ear normal.     Left Ear: External ear normal.     Nose: Nose normal.  Eyes:     General:        Right eye: No discharge.        Left eye: No discharge.     Conjunctiva/sclera: Conjunctivae normal.     Pupils: Pupils are equal, round, and reactive to light.  Neck:     Thyroid: No thyromegaly.     Vascular: No JVD.     Trachea: No tracheal deviation.  Cardiovascular:     Rate and Rhythm: Normal rate and regular rhythm.     Heart sounds: Normal heart sounds.  Pulmonary:     Effort: No respiratory distress.     Breath sounds: No stridor. No wheezing.  Abdominal:     General: Bowel sounds are normal. There is no distension.     Palpations: Abdomen is soft. There is no mass.     Tenderness: There is no abdominal tenderness. There is no guarding or rebound.  Musculoskeletal:        General: No tenderness.     Cervical back: Normal range of motion and neck supple. No rigidity.  Lymphadenopathy:     Cervical: No cervical adenopathy.  Skin:    Findings: No erythema or rash.  Neurological:     Cranial Nerves: No cranial nerve deficit.     Motor: No abnormal muscle tone.     Coordination: Coordination normal.     Gait: Gait normal.     Deep  Tendon Reflexes: Reflexes normal.  Psychiatric:        Behavior: Behavior normal.        Thought Content: Thought content normal.        Judgment: Judgment normal.     Lab Results  Component Value Date   WBC 7.1 11/06/2021   HGB 12.6 11/06/2021   HCT 38.3 11/06/2021   PLT 276.0 11/06/2021   GLUCOSE 92 11/06/2021   CHOL 196 01/03/2016   TRIG 85.0 01/03/2016   HDL 63.10 01/03/2016   LDLDIRECT 129.8 05/31/2012   LDLCALC 116 (H) 01/03/2016   ALT 21 11/06/2021   AST 27 11/06/2021   NA 133 (L) 11/06/2021   K 4.7 11/06/2021   CL 99 11/06/2021   CREATININE 0.98 11/06/2021   BUN 25 (H) 11/06/2021   CO2 25 11/06/2021   TSH 4.01 11/06/2021   INR 1.6 (A) 06/20/2022    MM DIAG BREAST TOMO BILATERAL  Result Date: 03/12/2022 CLINICAL DATA:  Focal tenderness in the medial left breast for the past 6 weeks. Status post left lumpectomy and radiation therapy for breast cancer in 2020. EXAM: DIGITAL DIAGNOSTIC BILATERAL MAMMOGRAM WITH TOMOSYNTHESIS; ULTRASOUND LEFT BREAST LIMITED TECHNIQUE: Bilateral digital diagnostic mammography and breast tomosynthesis was performed.; Targeted ultrasound examination of the left breast was performed. COMPARISON:  Previous exam(s). ACR Breast Density Category c: The breast tissue is heterogeneously dense, which may obscure small masses. FINDINGS: Stable post lumpectomy changes on the left. No interval findings suspicious for malignancy in either breast. No abnormality seen to explain the focal tenderness in the medial left breast, marked with a metallic marker. On physical exam, the patient is tender to palpation in the far medial left breast from the 10 to 7:30 o'clock position. No palpable mass. Targeted ultrasound is performed, showing post lumpectomy scar tissue in the lower inner left breast in the area of focal tenderness to palpation. The superior extent of the focal tenderness to palpation is at the medial extent of the surgical scar. No mass or other findings  suspicious for malignancy seen. IMPRESSION: No evidence of malignancy. RECOMMENDATION: Bilateral screening mammogram in 1 year. I have discussed the findings and recommendations with the patient. If applicable, a reminder letter will be sent to the patient regarding the next appointment. BI-RADS CATEGORY  2: Benign. Electronically Signed   By: Claudie Revering M.D.   On: 03/12/2022 13:59  US BREAST LTD UNI LEFT INC AXILLA  Result Date: 03/12/2022 CLINICAL DATA:  Focal tenderness in the medial left breast for the past 6 weeks. Status post left lumpectomy and radiation therapy for breast cancer in 2020. EXAM: DIGITAL DIAGNOSTIC BILATERAL MAMMOGRAM WITH TOMOSYNTHESIS; ULTRASOUND LEFT BREAST LIMITED TECHNIQUE: Bilateral digital diagnostic mammography and breast tomosynthesis was performed.; Targeted ultrasound examination of the left breast was performed. COMPARISON:  Previous exam(s). ACR Breast Density Category c: The breast tissue is heterogeneously dense, which may obscure small masses. FINDINGS: Stable post lumpectomy changes on the left. No interval findings suspicious for malignancy in either breast. No abnormality seen to explain the focal tenderness in the medial left breast, marked with a metallic marker. On physical exam, the patient is tender to palpation in the far medial left breast from the 10 to 7:30 o'clock position. No palpable mass. Targeted ultrasound is performed, showing post lumpectomy scar tissue in the lower inner left breast in the area of focal tenderness to palpation. The superior extent of the focal tenderness to palpation is at the medial extent of the surgical scar. No mass or other findings suspicious for malignancy seen. IMPRESSION: No evidence of malignancy. RECOMMENDATION: Bilateral screening mammogram in 1 year. I have discussed the findings and recommendations with the patient. If applicable, a reminder letter will be sent to the patient regarding the next appointment. BI-RADS  CATEGORY  2: Benign. Electronically Signed   By: Claudie Revering M.D.   On: 03/12/2022 13:59   Assessment & Plan:   Problem List Items Addressed This Visit       Cardiovascular and Mediastinum   Essential hypertension - Primary    Continue on Losartan, Norvasc. Clonidine prn high BP      Relevant Medications   amLODipine (NORVASC) 2.5 MG tablet   metoprolol succinate (TOPROL-XL) 25 MG 24 hr tablet   Other Relevant Orders   TSH   Urinalysis   CBC with Differential/Platelet   Lipid panel   Comprehensive metabolic panel     Other   Seizure (HCC)    On Vimpat      Relevant Medications   lacosamide (VIMPAT) 50 MG TABS tablet   zolpidem (AMBIEN) 10 MG tablet   Hyperlipidemia   Relevant Medications   amLODipine (NORVASC) 2.5 MG tablet   metoprolol succinate (TOPROL-XL) 25 MG 24 hr tablet   Other Relevant Orders   TSH   Urinalysis   CBC with Differential/Platelet   Lipid panel   Comprehensive metabolic panel   Dyslipidemia    On diet      Diarrhea    On lomotil prn      Anemia, iron deficiency   Relevant Orders   TSH   Urinalysis   CBC with Differential/Platelet   Lipid panel   Comprehensive metabolic panel      Meds ordered this encounter  Medications   amLODipine (NORVASC) 2.5 MG tablet    Sig: Take 1 tablet (2.5 mg total) by mouth daily.    Dispense:  90 tablet    Refill:  3   diphenoxylate-atropine (LOMOTIL) 2.5-0.025 MG tablet    Sig: Take 1 tablet by mouth 4 (four) times daily.    Dispense:  120 tablet    Refill:  1   lacosamide (VIMPAT) 50 MG TABS tablet    Sig: Take 1 tablet (50 mg total) by mouth 2 (two) times daily. Overdue for follow-up appt must see MD for refills    Dispense:  180 tablet    Refill:  3   traMADol (ULTRAM) 50 MG tablet    Sig: TAKE 1-2 TABLETS TWICE A DAY AS NEEDED FOR SEVERE PAIN.    Dispense:  270 tablet    Refill:  1   zolpidem (AMBIEN) 10 MG tablet    Sig: 1 & 1/2 TABLETS ('15MG'$ ) BY MOUTH AT BEDTIME AS NEEDED FOR SLEEP,  OK TO REPEAT 1&1/2 IN 4HRS    Dispense:  270 tablet    Refill:  1   triamcinolone cream (KENALOG) 0.5 %    Sig: Apply 1 Application topically 3 (three) times daily.    Dispense:  45 g    Refill:  1   fluticasone (FLONASE) 50 MCG/ACT nasal spray    Sig: Place 2 sprays into both nostrils daily.    Dispense:  48 g    Refill:  3   metoprolol succinate (TOPROL-XL) 25 MG 24 hr tablet    Sig: Take 1 tablet (25 mg total) by mouth daily.    Dispense:  90 tablet    Refill:  3      Follow-up: No follow-ups on file.  [name], MD

## 2022-07-02 ENCOUNTER — Ambulatory Visit: Payer: Medicare Other | Admitting: Internal Medicine

## 2022-07-11 ENCOUNTER — Ambulatory Visit (INDEPENDENT_AMBULATORY_CARE_PROVIDER_SITE_OTHER): Payer: Medicare Other

## 2022-07-11 DIAGNOSIS — Z7901 Long term (current) use of anticoagulants: Secondary | ICD-10-CM | POA: Diagnosis not present

## 2022-07-11 LAB — POCT INR: INR: 1.8 — AB (ref 2.0–3.0)

## 2022-07-11 NOTE — Progress Notes (Addendum)
Pt unsure what may have caused the subtherapeutic INR today. Discussed medication and diet. Pt denies any changes.  Increase dose to take 1 1/2 tablets today and then change weekly dose to take 1 tablet daily except take 1 1/2 tablets on Mondays, Wednesdays and Fridays. Recheck in 3 weeks.  Pt requested refill of warfarin. Pt is compliant with warfarin management and PCP apts. Sent in refill.

## 2022-07-11 NOTE — Patient Instructions (Addendum)
Pre visit review using our clinic review tool, if applicable. No additional management support is needed unless otherwise documented below in the visit note.  Increase dose to take 1 1/2 tablets today and then change weekly dose to take 1 tablet daily except take 1 1/2 tablets on Mondays, Wednesdays and Fridays. Recheck in 3 weeks.

## 2022-07-15 ENCOUNTER — Ambulatory Visit: Payer: Medicare Other | Admitting: Internal Medicine

## 2022-07-31 DIAGNOSIS — H6121 Impacted cerumen, right ear: Secondary | ICD-10-CM | POA: Diagnosis not present

## 2022-08-01 ENCOUNTER — Ambulatory Visit: Payer: Medicare Other

## 2022-08-05 ENCOUNTER — Ambulatory Visit (INDEPENDENT_AMBULATORY_CARE_PROVIDER_SITE_OTHER): Payer: Medicare Other

## 2022-08-05 DIAGNOSIS — Z7901 Long term (current) use of anticoagulants: Secondary | ICD-10-CM

## 2022-08-05 LAB — POCT INR: INR: 1.7 — AB (ref 2.0–3.0)

## 2022-08-05 NOTE — Progress Notes (Addendum)
Pt reports eating a lot of cashews for the last month. These contain a high amount of vitamin K. Increase dose to take 1 1/2 tablets today and then change weekly dose to take 1 1/2 tablets daily except take 1 tablet on Mondays, Wednesdays and Fridays . Recheck in 3 weeks.   Medical screening examination/treatment/procedure(s) were performed by non-physician practitioner and as supervising physician I was immediately available for consultation/collaboration.  I agree with above. Lew Dawes, MD

## 2022-08-05 NOTE — Patient Instructions (Addendum)
Pre visit review using our clinic review tool, if applicable. No additional management support is needed unless otherwise documented below in the visit note.  Increase dose to take 1 1/2 tablets today and then change weekly dose to take 1 1/2 tablets daily except take 1 tablet on Mondays, Wednesdays and Fridays . Recheck in 3 weeks.

## 2022-08-10 ENCOUNTER — Other Ambulatory Visit: Payer: Self-pay

## 2022-08-10 ENCOUNTER — Emergency Department (HOSPITAL_BASED_OUTPATIENT_CLINIC_OR_DEPARTMENT_OTHER): Payer: Medicare Other | Admitting: Radiology

## 2022-08-10 ENCOUNTER — Encounter (HOSPITAL_BASED_OUTPATIENT_CLINIC_OR_DEPARTMENT_OTHER): Payer: Self-pay | Admitting: Emergency Medicine

## 2022-08-10 ENCOUNTER — Inpatient Hospital Stay (HOSPITAL_COMMUNITY): Payer: Medicare Other

## 2022-08-10 DIAGNOSIS — D62 Acute posthemorrhagic anemia: Secondary | ICD-10-CM | POA: Diagnosis not present

## 2022-08-10 DIAGNOSIS — Z9181 History of falling: Secondary | ICD-10-CM

## 2022-08-10 DIAGNOSIS — M7989 Other specified soft tissue disorders: Secondary | ICD-10-CM | POA: Diagnosis present

## 2022-08-10 DIAGNOSIS — N134 Hydroureter: Secondary | ICD-10-CM | POA: Diagnosis not present

## 2022-08-10 DIAGNOSIS — I214 Non-ST elevation (NSTEMI) myocardial infarction: Secondary | ICD-10-CM

## 2022-08-10 DIAGNOSIS — M79674 Pain in right toe(s): Secondary | ICD-10-CM | POA: Diagnosis present

## 2022-08-10 DIAGNOSIS — M79671 Pain in right foot: Secondary | ICD-10-CM | POA: Diagnosis not present

## 2022-08-10 DIAGNOSIS — Z882 Allergy status to sulfonamides status: Secondary | ICD-10-CM

## 2022-08-10 DIAGNOSIS — B009 Herpesviral infection, unspecified: Secondary | ICD-10-CM | POA: Diagnosis present

## 2022-08-10 DIAGNOSIS — Z923 Personal history of irradiation: Secondary | ICD-10-CM

## 2022-08-10 DIAGNOSIS — I21A1 Myocardial infarction type 2: Secondary | ICD-10-CM | POA: Diagnosis not present

## 2022-08-10 DIAGNOSIS — R7989 Other specified abnormal findings of blood chemistry: Secondary | ICD-10-CM

## 2022-08-10 DIAGNOSIS — Z91041 Radiographic dye allergy status: Secondary | ICD-10-CM | POA: Diagnosis not present

## 2022-08-10 DIAGNOSIS — R102 Pelvic and perineal pain: Secondary | ICD-10-CM | POA: Diagnosis not present

## 2022-08-10 DIAGNOSIS — K589 Irritable bowel syndrome without diarrhea: Secondary | ICD-10-CM | POA: Diagnosis present

## 2022-08-10 DIAGNOSIS — Z881 Allergy status to other antibiotic agents status: Secondary | ICD-10-CM

## 2022-08-10 DIAGNOSIS — I951 Orthostatic hypotension: Secondary | ICD-10-CM | POA: Diagnosis not present

## 2022-08-10 DIAGNOSIS — Z8619 Personal history of other infectious and parasitic diseases: Secondary | ICD-10-CM

## 2022-08-10 DIAGNOSIS — Q600 Renal agenesis, unilateral: Secondary | ICD-10-CM

## 2022-08-10 DIAGNOSIS — N179 Acute kidney failure, unspecified: Secondary | ICD-10-CM | POA: Diagnosis present

## 2022-08-10 DIAGNOSIS — R062 Wheezing: Secondary | ICD-10-CM | POA: Diagnosis not present

## 2022-08-10 DIAGNOSIS — Z96641 Presence of right artificial hip joint: Secondary | ICD-10-CM | POA: Diagnosis present

## 2022-08-10 DIAGNOSIS — J189 Pneumonia, unspecified organism: Secondary | ICD-10-CM | POA: Diagnosis present

## 2022-08-10 DIAGNOSIS — D649 Anemia, unspecified: Secondary | ICD-10-CM | POA: Diagnosis not present

## 2022-08-10 DIAGNOSIS — M810 Age-related osteoporosis without current pathological fracture: Secondary | ICD-10-CM | POA: Diagnosis present

## 2022-08-10 DIAGNOSIS — I252 Old myocardial infarction: Secondary | ICD-10-CM

## 2022-08-10 DIAGNOSIS — Z9049 Acquired absence of other specified parts of digestive tract: Secondary | ICD-10-CM

## 2022-08-10 DIAGNOSIS — E871 Hypo-osmolality and hyponatremia: Secondary | ICD-10-CM | POA: Diagnosis not present

## 2022-08-10 DIAGNOSIS — Z86718 Personal history of other venous thrombosis and embolism: Secondary | ICD-10-CM | POA: Diagnosis not present

## 2022-08-10 DIAGNOSIS — I468 Cardiac arrest due to other underlying condition: Secondary | ICD-10-CM | POA: Diagnosis not present

## 2022-08-10 DIAGNOSIS — I10 Essential (primary) hypertension: Secondary | ICD-10-CM | POA: Diagnosis not present

## 2022-08-10 DIAGNOSIS — I509 Heart failure, unspecified: Secondary | ICD-10-CM

## 2022-08-10 DIAGNOSIS — Z7901 Long term (current) use of anticoagulants: Secondary | ICD-10-CM

## 2022-08-10 DIAGNOSIS — I350 Nonrheumatic aortic (valve) stenosis: Secondary | ICD-10-CM

## 2022-08-10 DIAGNOSIS — I272 Pulmonary hypertension, unspecified: Secondary | ICD-10-CM | POA: Diagnosis not present

## 2022-08-10 DIAGNOSIS — M25561 Pain in right knee: Secondary | ICD-10-CM | POA: Diagnosis present

## 2022-08-10 DIAGNOSIS — Z880 Allergy status to penicillin: Secondary | ICD-10-CM

## 2022-08-10 DIAGNOSIS — R54 Age-related physical debility: Secondary | ICD-10-CM | POA: Diagnosis present

## 2022-08-10 DIAGNOSIS — I110 Hypertensive heart disease with heart failure: Secondary | ICD-10-CM | POA: Diagnosis not present

## 2022-08-10 DIAGNOSIS — Z90710 Acquired absence of both cervix and uterus: Secondary | ICD-10-CM

## 2022-08-10 DIAGNOSIS — I471 Supraventricular tachycardia, unspecified: Secondary | ICD-10-CM | POA: Diagnosis not present

## 2022-08-10 DIAGNOSIS — R079 Chest pain, unspecified: Secondary | ICD-10-CM | POA: Diagnosis not present

## 2022-08-10 DIAGNOSIS — R569 Unspecified convulsions: Secondary | ICD-10-CM | POA: Diagnosis not present

## 2022-08-10 DIAGNOSIS — Z1152 Encounter for screening for COVID-19: Secondary | ICD-10-CM | POA: Diagnosis not present

## 2022-08-10 DIAGNOSIS — R58 Hemorrhage, not elsewhere classified: Secondary | ICD-10-CM | POA: Diagnosis not present

## 2022-08-10 DIAGNOSIS — R55 Syncope and collapse: Secondary | ICD-10-CM | POA: Insufficient documentation

## 2022-08-10 DIAGNOSIS — I700 Atherosclerosis of aorta: Secondary | ICD-10-CM | POA: Diagnosis not present

## 2022-08-10 DIAGNOSIS — E876 Hypokalemia: Secondary | ICD-10-CM | POA: Diagnosis not present

## 2022-08-10 DIAGNOSIS — Z888 Allergy status to other drugs, medicaments and biological substances status: Secondary | ICD-10-CM

## 2022-08-10 DIAGNOSIS — K219 Gastro-esophageal reflux disease without esophagitis: Secondary | ICD-10-CM | POA: Diagnosis not present

## 2022-08-10 DIAGNOSIS — R001 Bradycardia, unspecified: Secondary | ICD-10-CM | POA: Diagnosis not present

## 2022-08-10 DIAGNOSIS — Z79899 Other long term (current) drug therapy: Secondary | ICD-10-CM | POA: Diagnosis not present

## 2022-08-10 DIAGNOSIS — E785 Hyperlipidemia, unspecified: Secondary | ICD-10-CM | POA: Diagnosis present

## 2022-08-10 DIAGNOSIS — Z86010 Personal history of colonic polyps: Secondary | ICD-10-CM

## 2022-08-10 DIAGNOSIS — Z803 Family history of malignant neoplasm of breast: Secondary | ICD-10-CM

## 2022-08-10 DIAGNOSIS — Z841 Family history of disorders of kidney and ureter: Secondary | ICD-10-CM

## 2022-08-10 DIAGNOSIS — K683 Retroperitoneal hematoma: Secondary | ICD-10-CM | POA: Diagnosis not present

## 2022-08-10 DIAGNOSIS — Z8719 Personal history of other diseases of the digestive system: Secondary | ICD-10-CM

## 2022-08-10 DIAGNOSIS — N1330 Unspecified hydronephrosis: Secondary | ICD-10-CM | POA: Diagnosis not present

## 2022-08-10 DIAGNOSIS — Z01818 Encounter for other preprocedural examination: Secondary | ICD-10-CM | POA: Diagnosis not present

## 2022-08-10 DIAGNOSIS — I5033 Acute on chronic diastolic (congestive) heart failure: Secondary | ICD-10-CM | POA: Diagnosis not present

## 2022-08-10 DIAGNOSIS — G5601 Carpal tunnel syndrome, right upper limb: Secondary | ICD-10-CM | POA: Diagnosis not present

## 2022-08-10 DIAGNOSIS — I7300 Raynaud's syndrome without gangrene: Secondary | ICD-10-CM | POA: Diagnosis present

## 2022-08-10 DIAGNOSIS — Z85831 Personal history of malignant neoplasm of soft tissue: Secondary | ICD-10-CM

## 2022-08-10 DIAGNOSIS — Z8249 Family history of ischemic heart disease and other diseases of the circulatory system: Secondary | ICD-10-CM

## 2022-08-10 DIAGNOSIS — I5031 Acute diastolic (congestive) heart failure: Secondary | ICD-10-CM | POA: Diagnosis not present

## 2022-08-10 DIAGNOSIS — Z01810 Encounter for preprocedural cardiovascular examination: Secondary | ICD-10-CM | POA: Diagnosis not present

## 2022-08-10 DIAGNOSIS — R0602 Shortness of breath: Secondary | ICD-10-CM | POA: Diagnosis not present

## 2022-08-10 DIAGNOSIS — Z981 Arthrodesis status: Secondary | ICD-10-CM

## 2022-08-10 LAB — CBC
HCT: 37.1 % (ref 36.0–46.0)
Hemoglobin: 12.3 g/dL (ref 12.0–15.0)
MCH: 33 pg (ref 26.0–34.0)
MCHC: 33.2 g/dL (ref 30.0–36.0)
MCV: 99.5 fL (ref 80.0–100.0)
Platelets: 240 10*3/uL (ref 150–400)
RBC: 3.73 MIL/uL — ABNORMAL LOW (ref 3.87–5.11)
RDW: 13.7 % (ref 11.5–15.5)
WBC: 7.1 10*3/uL (ref 4.0–10.5)
nRBC: 0 % (ref 0.0–0.2)

## 2022-08-10 LAB — BRAIN NATRIURETIC PEPTIDE: B Natriuretic Peptide: 1585.6 pg/mL — ABNORMAL HIGH (ref 0.0–100.0)

## 2022-08-10 LAB — RESP PANEL BY RT-PCR (RSV, FLU A&B, COVID)  RVPGX2
Influenza A by PCR: NEGATIVE
Influenza B by PCR: NEGATIVE
Resp Syncytial Virus by PCR: NEGATIVE
SARS Coronavirus 2 by RT PCR: NEGATIVE

## 2022-08-10 LAB — TROPONIN I (HIGH SENSITIVITY)
Troponin I (High Sensitivity): 199 ng/L (ref <18)
Troponin I (High Sensitivity): 189 ng/L (ref <18)
Troponin I (High Sensitivity): 241 ng/L (ref <18)
Troponin I (High Sensitivity): 250 ng/L (ref <18)

## 2022-08-10 LAB — BASIC METABOLIC PANEL
Anion gap: 13 (ref 5–15)
BUN: 15 mg/dL (ref 8–23)
CO2: 21 mmol/L — ABNORMAL LOW (ref 22–32)
Calcium: 9.4 mg/dL (ref 8.9–10.3)
Chloride: 101 mmol/L (ref 98–111)
Creatinine, Ser: 0.99 mg/dL (ref 0.44–1.00)
GFR, Estimated: 56 mL/min — ABNORMAL LOW (ref >60)
Glucose, Bld: 94 mg/dL (ref 70–99)
Potassium: 3.9 mmol/L (ref 3.5–5.1)
Sodium: 135 mmol/L (ref 135–145)

## 2022-08-10 LAB — PROTIME-INR
INR: 2.2 — ABNORMAL HIGH (ref 0.8–1.2)
Prothrombin Time: 23.9 seconds — ABNORMAL HIGH (ref 11.4–15.2)

## 2022-08-10 LAB — GLUCOSE, CAPILLARY: Glucose-Capillary: 122 mg/dL — ABNORMAL HIGH (ref 70–99)

## 2022-08-10 LAB — D-DIMER, QUANTITATIVE: D-Dimer, Quant: 3 ug/mL-FEU — ABNORMAL HIGH (ref 0.00–0.50)

## 2022-08-10 LAB — HEPARIN LEVEL (UNFRACTIONATED): Heparin Unfractionated: 0.21 IU/mL — ABNORMAL LOW (ref 0.30–0.70)

## 2022-08-10 LAB — MAGNESIUM: Magnesium: 1.8 mg/dL (ref 1.7–2.4)

## 2022-08-10 MED ORDER — TRAMADOL HCL 50 MG PO TABS
50.0000 mg | ORAL_TABLET | Freq: Two times a day (BID) | ORAL | Status: DC | PRN
  Administered 2022-08-10: 50 mg via ORAL
  Filled 2022-08-10: qty 1

## 2022-08-10 MED ORDER — FUROSEMIDE 10 MG/ML IJ SOLN
40.0000 mg | Freq: Once | INTRAMUSCULAR | Status: AC
  Administered 2022-08-10: 40 mg via INTRAVENOUS
  Filled 2022-08-10: qty 4

## 2022-08-10 MED ORDER — ACETAMINOPHEN 325 MG PO TABS
650.0000 mg | ORAL_TABLET | ORAL | Status: DC | PRN
  Administered 2022-08-11 – 2022-08-15 (×3): 650 mg via ORAL
  Filled 2022-08-10 (×3): qty 2

## 2022-08-10 MED ORDER — ALBUTEROL SULFATE (2.5 MG/3ML) 0.083% IN NEBU: 2.5000 mg | INHALATION_SOLUTION | Freq: Once | RESPIRATORY_TRACT | Status: AC

## 2022-08-10 MED ORDER — DOXYCYCLINE HYCLATE 100 MG PO TABS
100.0000 mg | ORAL_TABLET | Freq: Once | ORAL | Status: AC
  Administered 2022-08-10: 100 mg via ORAL
  Filled 2022-08-10: qty 1

## 2022-08-10 MED ORDER — ASPIRIN 81 MG PO CHEW
324.0000 mg | CHEWABLE_TABLET | ORAL | Status: AC
  Administered 2022-08-10: 324 mg via ORAL
  Filled 2022-08-10: qty 4

## 2022-08-10 MED ORDER — DIPHENHYDRAMINE HCL 25 MG PO CAPS: 50.0000 mg | ORAL_CAPSULE | Freq: Once | ORAL | Status: DC

## 2022-08-10 MED ORDER — DIPHENHYDRAMINE HCL 50 MG/ML IJ SOLN: 50.0000 mg | Freq: Once | INTRAMUSCULAR | Status: DC

## 2022-08-10 MED ORDER — ASPIRIN 300 MG RE SUPP
300.0000 mg | RECTAL | Status: AC
  Filled 2022-08-10: qty 1

## 2022-08-10 MED ORDER — NITROGLYCERIN 0.4 MG SL SUBL
0.4000 mg | SUBLINGUAL_TABLET | SUBLINGUAL | Status: DC | PRN
  Administered 2022-08-11: 0.4 mg via SUBLINGUAL
  Filled 2022-08-10: qty 1

## 2022-08-10 MED ORDER — ONDANSETRON HCL 4 MG/2ML IJ SOLN
4.0000 mg | Freq: Four times a day (QID) | INTRAMUSCULAR | Status: DC | PRN
  Administered 2022-08-16 – 2022-08-17 (×5): 4 mg via INTRAVENOUS
  Filled 2022-08-10 (×5): qty 2

## 2022-08-10 MED ORDER — TECHNETIUM TO 99M ALBUMIN AGGREGATED
4.0000 | Freq: Once | INTRAVENOUS | Status: AC | PRN
  Administered 2022-08-10: 4 via INTRAVENOUS

## 2022-08-10 MED ORDER — METOPROLOL TARTRATE 50 MG PO TABS
50.0000 mg | ORAL_TABLET | Freq: Two times a day (BID) | ORAL | Status: DC
  Filled 2022-08-10: qty 1

## 2022-08-10 MED ORDER — LOSARTAN POTASSIUM 50 MG PO TABS
100.0000 mg | ORAL_TABLET | Freq: Every day | ORAL | Status: DC
  Administered 2022-08-10 – 2022-08-11 (×2): 100 mg via ORAL
  Filled 2022-08-10 (×2): qty 2

## 2022-08-10 MED ORDER — HEPARIN (PORCINE) 25000 UT/250ML-% IV SOLN
900.0000 [IU]/h | INTRAVENOUS | Status: DC
  Administered 2022-08-10: 800 [IU]/h via INTRAVENOUS
  Filled 2022-08-10: qty 250

## 2022-08-10 MED ORDER — ZOLPIDEM TARTRATE 5 MG PO TABS
5.0000 mg | ORAL_TABLET | Freq: Every evening | ORAL | Status: DC | PRN
  Administered 2022-08-10 – 2022-08-17 (×8): 5 mg via ORAL
  Filled 2022-08-10 (×10): qty 1

## 2022-08-10 MED ORDER — ALBUTEROL SULFATE (2.5 MG/3ML) 0.083% IN NEBU
INHALATION_SOLUTION | RESPIRATORY_TRACT | Status: AC
  Administered 2022-08-10: 2.5 mg via RESPIRATORY_TRACT
  Filled 2022-08-10: qty 3

## 2022-08-10 MED ORDER — ASPIRIN 81 MG PO TBEC
81.0000 mg | DELAYED_RELEASE_TABLET | Freq: Every day | ORAL | Status: DC
  Administered 2022-08-11 – 2022-08-17 (×7): 81 mg via ORAL
  Filled 2022-08-10 (×7): qty 1

## 2022-08-10 MED ORDER — FUROSEMIDE 10 MG/ML IJ SOLN
60.0000 mg | Freq: Two times a day (BID) | INTRAMUSCULAR | Status: DC
  Administered 2022-08-10 – 2022-08-11 (×3): 60 mg via INTRAVENOUS
  Filled 2022-08-10 (×3): qty 6

## 2022-08-10 MED ORDER — ISOSORBIDE MONONITRATE ER 30 MG PO TB24
30.0000 mg | ORAL_TABLET | Freq: Every day | ORAL | Status: DC
  Administered 2022-08-10 – 2022-08-11 (×2): 30 mg via ORAL
  Filled 2022-08-10 (×2): qty 1

## 2022-08-10 MED ORDER — FAMOTIDINE 20 MG PO TABS
20.0000 mg | ORAL_TABLET | Freq: Every day | ORAL | Status: DC
  Administered 2022-08-11 – 2022-08-18 (×8): 20 mg via ORAL
  Filled 2022-08-10 (×8): qty 1

## 2022-08-10 MED ORDER — SODIUM CHLORIDE 0.9 % IV SOLN
1.0000 g | Freq: Once | INTRAVENOUS | Status: AC
  Administered 2022-08-10: 1 g via INTRAVENOUS
  Filled 2022-08-10: qty 10

## 2022-08-10 MED ORDER — ROSUVASTATIN CALCIUM 5 MG PO TABS
10.0000 mg | ORAL_TABLET | Freq: Every day | ORAL | Status: DC
  Administered 2022-08-10 – 2022-08-11 (×2): 10 mg via ORAL
  Filled 2022-08-10 (×4): qty 2

## 2022-08-10 MED ORDER — METHYLPREDNISOLONE SODIUM SUCC 40 MG IJ SOLR: 40.0000 mg | Freq: Once | INTRAMUSCULAR | Status: DC

## 2022-08-10 NOTE — Progress Notes (Signed)
ANTICOAGULATION CONSULT NOTE - Initial Consult  Pharmacy Consult for heparin Indication: pulmonary embolus  Allergies  Allergen Reactions   Bee Venom Anaphylaxis and Other (See Comments)    Yellow jackets, wasps as well   Honey Bee Venom Anaphylaxis   Iohexol Anaphylaxis   Ivp Dye [Iodinated Contrast Media] Anaphylaxis   Meperidine Hcl Anaphylaxis   Shellfish Allergy Anaphylaxis   Temazepam Other (See Comments)    Severe Hallucinations and Paranoia   Amlodipine Besylate Swelling   Aspirin Other (See Comments)    REACTION: break out in sweats   Atorvastatin Diarrhea   Cholestyramine Other (See Comments)    REACTION: mouth irritation   Codeine Phosphate Nausea And Vomiting    Can take Tramadol ok   Demerol [Meperidine] Other (See Comments)    Severe Hallucination!!!!   Diphenhydramine Hcl Other (See Comments)    hyperactive   Doxycycline Nausea Only   Erythromycin Base Other (See Comments)   Gentamicin Sulfate Other (See Comments)    REACTION: Shortness of breath    Keppra [Levetiracetam]     Made her too groggy/ sleepy   Meclizine Other (See Comments)   Meclizine Hcl Other (See Comments)    REACTION: more dizziness on it   Metronidazole Other (See Comments)    REACTION: bad taste   Moxifloxacin Other (See Comments)    REACTION: insomnia Able to use Cipro   Niacin Other (See Comments)   Sulfamethoxazole Other (See Comments)    Kidney problems   Amoxicillin Rash   Iodine Rash   Penicillins Other (See Comments), Rash and Swelling    Has patient had a PCN reaction causing immediate rash, facial/tongue/throat swelling, SOB or lightheadedness with hypotension: Yes  Has patient had a PCN reaction causing severe rash involving mucus membranes or skin necrosis: No  Has patient had a PCN reaction that required hospitalization: No  Has patient had a PCN reaction occurring within the last 10 years: No  If all of the above answers are "NO", then may proceed with  Cephalosporin use.   Sulfa Antibiotics Rash and Other (See Comments)    Patient Measurements:   Heparin Dosing Weight: TBW  Vital Signs: Temp: 98 F (36.7 C) (02/25 0900) Temp Source: Oral (02/25 0900) BP: 176/104 (02/25 0900) Pulse Rate: 110 (02/25 0900)  Labs: Recent Labs    08/13/2022 0900  HGB 12.3  HCT 37.1  PLT 240  LABPROT 23.9*  INR 2.2*  CREATININE 0.99  TROPONINIHS 199*    CrCl cannot be calculated (Unknown ideal weight.).   Medical History: Past Medical History:  Diagnosis Date   Adenomatous colon polyp    Allergic rhinitis    uses FLonase nightly   Anemia    Arthritis    joint pain   Blood transfusion    Bronchitis    hx of-7-59yr ago   Carpal tunnel syndrome    numbness and tingling    Cataracts, bilateral    Chronic anticoagulation 03/21/2013   Congenital absence of one kidney    pt only has one kidney   Diarrhea    takes Immodium daily as needed or Lomotil    Diverticulosis    GERD (gastroesophageal reflux disease)    mild   Herpes    takes Valtrex daily   History of colon polyps    History of DVT (deep vein thrombosis) 1998   right arm   History of staph infection    Hyperlipidemia    Medical MD doesn't think its absolutely necessary  Hypertension    takes Losartan daily   IBS (irritable bowel syndrome)    takes Align daily   Insomnia    takes Ambien nightly as needed   LBP (low back pain)    Long term (current) use of anticoagulants    Dr. 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  Discussed with EDP and will start conservatively with heparin, no bolus, pending VQ scan  Goal of Therapy:  Heparin level 0.3-0.7 units/ml Monitor platelets by anticoagulation protocol: Yes   Plan:  Heparin gtt at 800 units/hr, no bolus F/u 8 hour heparin level F/u PE workup/VQ scan results  Bertis Ruddy, PharmD, [place] Pharmacist ED Pharmacist Phone # [phone] 08/13/2022 11:28 AM

## 2022-08-10 NOTE — Progress Notes (Signed)
   08/01/2022 1652  Assess: MEWS Score  Temp 98.9 F (37.2 C)  BP 134/81  MAP (mmHg) 97  Pulse Rate (!) 105  ECG Heart Rate (!) 105  Resp (!) 22  SpO2 97 %  O2 Device Nasal Cannula  Assess: MEWS Score  MEWS Temp 0  MEWS Systolic 0  MEWS Pulse 1  MEWS RR 1  MEWS LOC 0  MEWS Score 2  MEWS Score Color Yellow  Assess: if the MEWS score is Yellow or Red  Were vital signs taken at a resting state? Yes  Focused Assessment No change from prior assessment  Does the patient meet 2 or more of the SIRS criteria? Yes  Does the patient have a confirmed or suspected source of infection? No  MEWS guidelines implemented  Yes, yellow  Treat  MEWS Interventions Considered administering scheduled or prn medications/treatments as ordered  Take Vital Signs  Increase Vital Sign Frequency  Yellow: Q2hr x1, continue Q4hrs until patient remains green for 12hrs  Escalate  MEWS: Escalate Yellow: Discuss with charge nurse and consider notifying provider and/or RRT  Notify: Charge Nurse/RN  Name of Charge Nurse/RN Notified Sandy  Provider Notification  Provider Name/Title Gasper Sells  Date Provider Notified 08/06/2022  Time Provider Notified 1715  Method of Notification Face-to-face  Notification Reason Other (Comment) (rounding)  Provider response At bedside;No new orders  Assess: SIRS CRITERIA  SIRS Temperature  0  SIRS Pulse 1  SIRS Respirations  1  SIRS WBC 0  SIRS Score Sum  2

## 2022-08-10 NOTE — ED Notes (Signed)
Pt. Is in no distress. Her husband remains with her. Plan to give meds upon return from [place].

## 2022-08-10 NOTE — ED Triage Notes (Signed)
Chest pain and wheezing since yesterday. Congested since yesterday.

## 2022-08-10 NOTE — Progress Notes (Signed)
Rounding Note    Patient Name: Tracy Peters Date of Encounter: 08/13/2022  [facility] Cardiologist: Werner Lean, MD ***  Subjective   ***  Inpatient Medications    Scheduled Meds:  [START ON 08/11/2022] aspirin EC  81 mg Oral Daily   [START ON 08/11/2022] famotidine  20 mg Oral Daily   furosemide  60 mg Intravenous BID   isosorbide mononitrate  30 mg Oral Daily   losartan  100 mg Oral Daily   rosuvastatin  10 mg Oral Daily   Continuous Infusions:  heparin 800 Units/hr (07/23/2022 1247)   PRN Meds: acetaminophen, nitroGLYCERIN, ondansetron (ZOFRAN) IV, traMADol, zolpidem   Vital Signs    Vitals:   08/01/2022 1432 07/19/2022 1500 08/11/2022 1652 08/07/2022 1703  BP: (!) 149/80 106/63 134/81 (!) 149/79  Pulse: (!) 105  (!) 105 (!) 102  Resp: 18  (!) 22 18  Temp: 98.2 F (36.8 C)  98.9 F (37.2 C) 98.8 F (37.1 C)  TempSrc: Oral  Oral Oral  SpO2: 96%  97% 97%  Weight: 54.4 kg     Height: '5\' 4"'$  (1.626 m)       Intake/Output Summary (Last 24 hours) at 07/20/2022 1934 Last data filed at 08/13/2022 1800 Gross per 24 hour  Intake 275.47 ml  Output --  Net 275.47 ml      07/29/2022    2:32 PM 06/23/2022    3:21 PM 11/06/2021   10:46 AM  Last 3 Weights  Weight (lbs) 120 lb 123 lb 119 lb 6.4 oz  Weight (kg) 54.432 kg 55.792 kg 54.159 kg      Telemetry    *** - Personally Reviewed  ECG    *** - Personally Reviewed  Physical Exam  *** GEN: No acute distress.   Neck: No JVD Cardiac: RRR, no murmurs, rubs, or gallops.  Respiratory: Clear to auscultation bilaterally. GI: Soft, nontender, non-distended  MS: No edema; No deformity. Neuro:  Nonfocal  Psych: Normal affect   Labs    High Sensitivity Troponin:   Recent Labs  Lab 07/27/2022 0900 07/20/2022 1056 07/20/2022 1616 08/11/2022 1743  TROPONINIHS 199* 189* 241* 250*     Chemistry Recent Labs  Lab 08/11/2022 0900 07/30/2022 1056  NA 135  --   K 3.9  --   CL 101  --   CO2 21*   --   GLUCOSE 94  --   BUN 15  --   CREATININE 0.99  --   CALCIUM 9.4  --   MG  --  1.8  GFRNONAA 56*  --   ANIONGAP 13  --     Lipids No results for input(s): "CHOL", "TRIG", "HDL", "LABVLDL", "LDLCALC", "CHOLHDL" in the last 168 hours.  Hematology Recent Labs  Lab 07/29/2022 0900  WBC 7.1  RBC 3.73*  HGB 12.3  HCT 37.1  MCV 99.5  MCH 33.0  MCHC 33.2  RDW 13.7  PLT 240   Thyroid No results for input(s): "TSH", "FREET4" in the last 168 hours.  BNP Recent Labs  Lab 07/17/2022 0900  BNP 1,585.6*    DDimer  Recent Labs  Lab 08/11/2022 0900  DDIMER 3.00*     Radiology    NM Pulmonary Perfusion  Result Date: 08/01/2022 CLINICAL DATA:  Pulmonary embolism (PE) suspected, high prob Shortness of breath.  Chest pain. EXAM: NUCLEAR MEDICINE PERFUSION LUNG SCAN TECHNIQUE: Perfusion images were obtained in multiple projections after intravenous injection of radiopharmaceutical. Ventilation scans intentionally deferred if perfusion  scan and chest x-ray adequate for interpretation during COVID 19 epidemic. RADIOPHARMACEUTICALS:  4 mCi Tc-50mMAA IV COMPARISON:  Chest radiograph earlier today. FINDINGS: Homogeneous perfusion throughout both lungs. No peripheral or wedge-shaped defects to suggest pulmonary embolus. IMPRESSION: No scintigraphic evidence of pulmonary embolus. Electronically Signed   By: MKeith RakeM.D.   On: 08/09/2022 17:19   DG Knee Complete 4 Views Right  Result Date: 07/26/2022 CLINICAL DATA:  RIGHT knee pain EXAM: RIGHT KNEE - COMPLETE 4+ VIEW COMPARISON:  None Available. FINDINGS: Osseous alignment is within normal limits. No fracture line or displaced fracture fragment. No acute-appearing cortical irregularity or osseous lesion. Tricompartmental degenerative osteoarthritic changes, mild to moderate in degree with joint space narrowing and mild osseous spurring. Faint calcifications within the medial and lateral compartments, compatible with associated  chondrocalcinosis. Probable small joint effusion within the suprapatellar bursa. Extensive atherosclerosis of the superficial femoral artery. Superficial soft tissues about the RIGHT knee are otherwise unremarkable. IMPRESSION: 1. No acute-appearing osseous abnormality. 2. Probable small joint effusion. 3. Tricompartmental degenerative osteoarthritic changes, mild to moderate in degree. 4. Chondrocalcinosis within the medial and lateral compartments, compatible with underlying CPPD. 5. Extensive atherosclerosis of the superficial femoral artery. Electronically Signed   By: SFranki CabotM.D.   On: 07/20/2022 10:41   DG Foot Complete Right  Result Date: 07/28/2022 CLINICAL DATA:  RIGHT foot pain EXAM: RIGHT FOOT COMPLETE - 3+ VIEW COMPARISON:  None Available. FINDINGS: No acute-appearing osseous abnormality. No fracture line or displaced fracture fragment is seen, although osteopenia limits characterization of osseous detail. Soft tissues about the RIGHT w foot are unremarkable. Degenerative osteoarthritic changes at the first MTP joint, at least moderate in degree with joint space loss and articular surface sclerosis. Milder degenerative changes are seen at the TMT, interphalangeal joint spaces and midfoot. IMPRESSION: 1. No acute findings. 2. Degenerative osteoarthritic changes, as detailed above. 3. Osteopenia. Electronically Signed   By: SFranki CabotM.D.   On: 08/01/2022 10:39   DG Chest Port 1 View  Result Date: 08/01/2022 CLINICAL DATA:  Chest pain and wheezing since yesterday. EXAM: PORTABLE CHEST 1 VIEW COMPARISON:  Chest x-rays dated 05/17/2021 and 02/05/2020 FINDINGS: Coarse lung markings are seen bilaterally. Increased interstitial markings are seen throughout both lungs. More confluent opacity at the LEFT lung base. Probable small LEFT pleural effusion. No pneumothorax is seen. Heart size and mediastinal contours are stable. IMPRESSION: 1. Increased interstitial markings throughout both lungs,  most likely pulmonary edema and/or atypical pneumonia superimposed on chronic interstitial lung disease, alternatively a rapidly worsening chronic interstitial lung disease. 2. More confluent opacity at the LEFT lung base, atelectasis versus pneumonia. 3. Probable small LEFT pleural effusion. Electronically Signed   By: SFranki CabotM.D.   On: 07/23/2022 10:36    Cardiac Studies   ***  Patient Profile     89y.o. female with history of HTN, HLD, PAD, prior DVT and mild carotid disease who presented with worsening chest pain and SOB found to have acute heart failure exacerbation, NSTEMI and suspected AS for which Cardiology was consulted.   Assessment & Plan    #Acute HF:  #NSTEMI:  #Loud Systolic Murmur:  #HTN:  #HLD:   {Are we signing off today?:[id number]}  For questions or updates, please contact CRio LindaPlease consult [email or web address] for contact info under        Signed, HFreada Bergeron MD  07/23/2022, 7:34 PM

## 2022-08-10 NOTE — ED Notes (Signed)
Pt. Remains in no distress. [facility] is loading her onto their stretcher as I write this.

## 2022-08-10 NOTE — ED Provider Notes (Signed)
[facility] Provider Note   CSN: [id number] Arrival date & time: 07/30/2022  [id number]     History  Chief Complaint  Patient presents with   Chest Pain   Wheezing    Tracy Peters is a 89 y.o. female.   Chest Pain Wheezing Associated symptoms: chest pain     89 year old female presents emergency department with complaints of shortness of breath and chest pain.  Patient states that symptoms began insidiously yesterday when she was sitting in her recliner watching TV.  Patient states that symptoms persisted through the night and worsened upon awakening this.  States she has never had symptoms like this before.  She reports chest pain described as midsternal just left of sternal as pressure without radiation.  Reports "having to take deep breaths at rest to breathe."  She also reports accompanying cough over the past 2 to 3 days as well as increased lower extremity swelling over the same amount of time.  Patient on warfarin secondary to history of DVT and right arterial thrombosis of which she states compliance with her at home medications. Denies fever, abdominal pain, nausea, vomiting, urinary symptoms, change in bowel habits.  Patient also reports 2 falls over the past 2 weeks with most recent fall this past Thursday.  Fall described as mechanical in nature when she was getting out of the bed to use the restroom around 2 to 3 AM.  Notes tripping and landing on her right knee.  She denies trauma to head or loss of consciousness.  She is currently complaining of continued right knee pain as well as right fourth toe pain.  Denies fever, chills, night sweats, abdominal pain, nausea, vomiting, urinary symptoms, change in bowel habits.  Patient has been able to ambulate independently since most recent fall.  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 Authorizing Provider  amLODipine (NORVASC) 2.5 MG tablet Take 1 tablet (2.5 mg total) by mouth daily. 06/23/22  Yes Plotnikov, Evie Lacks, MD  Cholecalciferol (VITAMIN D3) 1.25 MG (50000 UT) CAPS Take 1 capsule every 14 days 05/21/21  Yes Plotnikov, Evie Lacks, MD  cloNIDine (CATAPRES) 0.1 MG tablet Take 1 tablet (0.1 mg total) by mouth 2 (two) times daily. 09/17/20  Yes Plotnikov, Evie Lacks, MD  famotidine (PEPCID) 20 MG tablet Take 1 tablet (20 mg total) by mouth 2 (two) times daily. 05/21/21  Yes Plotnikov, Evie Lacks, MD  ferrous sulfate 325 (65 FE) MG tablet Take 1 tablet (325 mg total) by mouth every other day. 06/15/19  Yes Plotnikov, Evie Lacks, MD  fluticasone (FLONASE) 50 MCG/ACT nasal spray Place 2 sprays into both nostrils daily. 06/23/22  Yes Plotnikov, Evie Lacks, MD  lacosamide (VIMPAT) 50 MG TABS tablet Take 1 tablet (50 mg total) by mouth 2 (two) times daily. Overdue for follow-up appt must see MD for refills 06/23/22  Yes Plotnikov, Evie Lacks, MD  losartan (COZAAR) 100 MG tablet Take 1 tablet (100 mg total) by mouth daily. 11/06/21  Yes Plotnikov, Evie Lacks, MD  metoprolol succinate (TOPROL-XL) 25 MG 24 hr tablet Take 1 tablet (25 mg total) by mouth daily. 06/23/22  Yes Plotnikov, Evie Lacks, MD  tobramycin-dexamethasone [facility]) ophthalmic solution Place 1 drop into both eyes at bedtime. 10/06/19  Yes [provider]  traMADol (  ULTRAM) 50 MG tablet TAKE 1-2 TABLETS TWICE A DAY AS NEEDED FOR SEVERE PAIN. Patient taking differently: Take 50 mg by mouth 2 (two) times daily. 06/23/22  Yes Plotnikov, Evie Lacks, MD  triamcinolone cream (KENALOG) 0.5 % Apply 1 Application topically 3 (three) times daily. Patient taking differently: Apply 1 Application topically daily as needed (rash). 06/23/22 06/23/23 Yes Plotnikov, Evie Lacks, MD  warfarin (COUMADIN) 5 MG tablet TAKE  1 TABLET BY MOUTH DAILY EXCEPT TAKE 1 1/2 TABLETS ON SUNDAYS AND THURSDAYS OR AS DIRECTED BY ANTICOAGULATION CLINIC Patient taking differently: Take 7.5 mg by mouth daily. TAKE 1 TABLET BY MOUTH DAILY EXCEPT TAKE 1 1/2 TABLETS ON SUNDAYS AND THURSDAYS OR AS DIRECTED BY ANTICOAGULATION CLINIC 02/25/22  Yes Plotnikov, Evie Lacks, MD  zolpidem (AMBIEN) 10 MG tablet 1 & 1/2 TABLETS ('15MG'$ ) BY MOUTH AT BEDTIME AS NEEDED FOR SLEEP, OK TO REPEAT 1&1/2 IN 4HRS Patient taking differently: Take 15 mg by mouth at bedtime. 06/23/22  Yes Plotnikov, Evie Lacks, MD  diphenoxylate-atropine (LOMOTIL) 2.5-0.025 MG tablet Take 1 tablet by mouth 4 (four) times daily. Patient not taking: Reported on 08/05/2022 06/23/22   Plotnikov, Evie Lacks, MD  EPINEPHrine 0.3 mg/0.3 mL IJ SOAJ injection USE AS DIRECTED Patient taking differently: Inject 0.3 mg into the muscle as needed for anaphylaxis. 10/12/18   Plotnikov, Evie Lacks, MD  ondansetron (ZOFRAN) 4 MG tablet Take 1 tablet (4 mg total) by mouth every 8 (eight) hours as needed for nausea or vomiting. Patient not taking: Reported on 08/13/2022 05/28/21   Plotnikov, Evie Lacks, MD  valACYclovir (VALTREX) 500 MG tablet Take 1 tablet (500 mg total) by mouth 2 (two) times daily. Patient not taking: Reported on 07/24/2022 05/21/21   Plotnikov, Evie Lacks, MD      Allergies    Bee venom, Honey bee venom, Iohexol, Ivp dye [iodinated contrast media], Meperidine hcl, Shellfish allergy, Temazepam, Amlodipine besylate, Aspirin, Atorvastatin, Cholestyramine, Codeine phosphate, Demerol [meperidine], Diphenhydramine hcl, Doxycycline, Erythromycin base, Gentamicin sulfate, Keppra [levetiracetam], Meclizine, Meclizine hcl, Metronidazole, Moxifloxacin, Niacin, Sulfamethoxazole, Amoxicillin, Iodine, Penicillins, and Sulfa antibiotics    Review of Systems   Review of Systems  Respiratory:  Positive for wheezing.   Cardiovascular:  Positive for chest pain.  All other systems reviewed and are  negative.   Physical Exam Updated Vital Signs BP (!) 149/79 (BP Location: Left Arm)   Pulse (!) 102   Temp 98.8 F (37.1 C) (Oral)   Resp 18   Ht '5\' 4"'$  (1.626 m)   Wt 54.4 kg   SpO2 97%   BMI 20.60 kg/m  Physical Exam Vitals and nursing note reviewed.  Constitutional:      General: She is not in acute distress.    Appearance: She is well-developed.  HENT:     Head: Normocephalic and atraumatic.     Nose: No congestion or rhinorrhea.     Mouth/Throat:     Pharynx: No oropharyngeal exudate or posterior oropharyngeal erythema.  Eyes:     Conjunctiva/sclera: Conjunctivae normal.  Cardiovascular:     Rate and Rhythm: Normal rate and regular rhythm.     Heart sounds: Murmur heard.     Systolic murmur is present.     Comments: Systolic murmur appreciated right upper sternal border. Pulmonary:     Effort: Pulmonary effort is normal. No respiratory distress.     Breath sounds: Rales present. No wheezing or rhonchi.  Abdominal:     Palpations: Abdomen is soft.     Tenderness: There is  no abdominal tenderness. There is no right CVA tenderness or left CVA tenderness.  Musculoskeletal:        General: No swelling.     Cervical back: Neck supple. No rigidity or tenderness.     Comments: 1-2+ bilateral lower extremity edema appreciated.  Tender to palpation right knee as well as right fourth toe.  Skin:    General: Skin is warm and dry.     Capillary Refill: Capillary refill takes less than 2 seconds.  Neurological:     Mental Status: She is alert.  Psychiatric:        Mood and Affect: Mood normal.     ED Results / Procedures / Treatments   Labs (all labs ordered are listed, but only abnormal results are displayed) Labs Reviewed  BASIC METABOLIC PANEL - Abnormal; Notable for the following components:      Result Value   CO2 21 (*)    GFR, Estimated 56 (*)    All other components within normal limits  CBC - Abnormal; Notable for the following components:   RBC 3.73 (*)     All other components within normal limits  BRAIN NATRIURETIC PEPTIDE - Abnormal; Notable for the following components:   B Natriuretic Peptide 1,585.6 (*)    All other components within normal limits  PROTIME-INR - Abnormal; Notable for the following components:   Prothrombin Time 23.9 (*)    INR 2.2 (*)    All other components within normal limits  D-DIMER, QUANTITATIVE - Abnormal; Notable for the following components:   D-Dimer, Quant 3.00 (*)    All other components within normal limits  TROPONIN I (HIGH SENSITIVITY) - Abnormal; Notable for the following components:   Troponin I (High Sensitivity) 199 (*)    All other components within normal limits  TROPONIN I (HIGH SENSITIVITY) - Abnormal; Notable for the following components:   Troponin I (High Sensitivity) 189 (*)    All other components within normal limits  TROPONIN I (HIGH SENSITIVITY) - Abnormal; Notable for the following components:   Troponin I (High Sensitivity) 241 (*)    All other components within normal limits  RESP PANEL BY RT-PCR (RSV, FLU A&B, COVID)  RVPGX2  MAGNESIUM  HEPARIN LEVEL (UNFRACTIONATED)  LIPOPROTEIN A (LPA)  LIPID PANEL  CBC  BASIC METABOLIC PANEL  HEPARIN LEVEL (UNFRACTIONATED)  TROPONIN I (HIGH SENSITIVITY)    EKG EKG Interpretation  Date/Time:  Sunday August 10 2022 08:56:03 EST Ventricular Rate:  114 PR Interval:  159 QRS Duration: 91 QT Interval:  326 QTC Calculation: 449 R Axis:   63 Text Interpretation: Sinus tachycardia Anterior infarct, old Lalteral ST depression with TWI new changes noted, repear ekg ordered Confirmed by Varney Biles [phone]) on 08/08/2022 10:09:41 AM  Radiology NM Pulmonary Perfusion  Result Date: 07/26/2022 CLINICAL DATA:  Pulmonary embolism (PE) suspected, high prob Shortness of breath.  Chest pain. EXAM: NUCLEAR MEDICINE PERFUSION LUNG SCAN TECHNIQUE: Perfusion images were obtained in multiple projections after intravenous injection of  radiopharmaceutical. Ventilation scans intentionally deferred if perfusion scan and chest x-ray adequate for interpretation during COVID 19 epidemic. RADIOPHARMACEUTICALS:  4 mCi Tc-69mMAA IV COMPARISON:  Chest radiograph earlier today. FINDINGS: Homogeneous perfusion throughout both lungs. No peripheral or wedge-shaped defects to suggest pulmonary embolus. IMPRESSION: No scintigraphic evidence of pulmonary embolus. Electronically Signed   By: MKeith RakeM.D.   On: 07/22/2022 17:19   DG Knee Complete 4 Views Right  Result Date: 08/01/2022 CLINICAL DATA:  RIGHT knee pain EXAM:  RIGHT KNEE - COMPLETE 4+ VIEW COMPARISON:  None Available. FINDINGS: Osseous alignment is within normal limits. No fracture line or displaced fracture fragment. No acute-appearing cortical irregularity or osseous lesion. Tricompartmental degenerative osteoarthritic changes, mild to moderate in degree with joint space narrowing and mild osseous spurring. Faint calcifications within the medial and lateral compartments, compatible with associated chondrocalcinosis. Probable small joint effusion within the suprapatellar bursa. Extensive atherosclerosis of the superficial femoral artery. Superficial soft tissues about the RIGHT knee are otherwise unremarkable. IMPRESSION: 1. No acute-appearing osseous abnormality. 2. Probable small joint effusion. 3. Tricompartmental degenerative osteoarthritic changes, mild to moderate in degree. 4. Chondrocalcinosis within the medial and lateral compartments, compatible with underlying CPPD. 5. Extensive atherosclerosis of the superficial femoral artery. Electronically Signed   By: Franki Cabot M.D.   On: 08/04/2022 10:41   DG Foot Complete Right  Result Date: 07/19/2022 CLINICAL DATA:  RIGHT foot pain EXAM: RIGHT FOOT COMPLETE - 3+ VIEW COMPARISON:  None Available. FINDINGS: No acute-appearing osseous abnormality. No fracture line or displaced fracture fragment is seen, although osteopenia limits  characterization of osseous detail. Soft tissues about the RIGHT w foot are unremarkable. Degenerative osteoarthritic changes at the first MTP joint, at least moderate in degree with joint space loss and articular surface sclerosis. Milder degenerative changes are seen at the TMT, interphalangeal joint spaces and midfoot. IMPRESSION: 1. No acute findings. 2. Degenerative osteoarthritic changes, as detailed above. 3. Osteopenia. Electronically Signed   By: Franki Cabot M.D.   On: 07/23/2022 10:39   DG Chest Port 1 View  Result Date: 07/28/2022 CLINICAL DATA:  Chest pain and wheezing since yesterday. EXAM: PORTABLE CHEST 1 VIEW COMPARISON:  Chest x-rays dated 05/17/2021 and 02/05/2020 FINDINGS: Coarse lung markings are seen bilaterally. Increased interstitial markings are seen throughout both lungs. More confluent opacity at the LEFT lung base. Probable small LEFT pleural effusion. No pneumothorax is seen. Heart size and mediastinal contours are stable. IMPRESSION: 1. Increased interstitial markings throughout both lungs, most likely pulmonary edema and/or atypical pneumonia superimposed on chronic interstitial lung disease, alternatively a rapidly worsening chronic interstitial lung disease. 2. More confluent opacity at the LEFT lung base, atelectasis versus pneumonia. 3. Probable small LEFT pleural effusion. Electronically Signed   By: Franki Cabot M.D.   On: 07/18/2022 10:36    Procedures .Critical Care  Performed by: Wilnette Kales, PA Authorized by: Wilnette Kales, PA   Critical care provider statement:    Critical care time (minutes):  49   Critical care was necessary to treat or prevent imminent or life-threatening deterioration of the following conditions:  Cardiac failure   Critical care was time spent personally by me on the following activities:  Development of treatment plan with patient or surrogate, discussions with consultants, evaluation of patient's response to treatment,  examination of patient, ordering and review of laboratory studies, ordering and review of radiographic studies, ordering and performing treatments and interventions, pulse oximetry, re-evaluation of patient's condition and review of old charts     Medications Ordered in ED Medications  heparin ADULT infusion 100 units/mL (25000 units/236m) (800 Units/hr Intravenous Transfusing/Transfer 08/07/2022 1447)  traMADol (ULTRAM) tablet 50 mg (has no administration in time range)  losartan (COZAAR) tablet 100 mg (100 mg Oral Given 08/06/2022 1703)  zolpidem (AMBIEN) tablet 5 mg (has no administration in time range)  famotidine (PEPCID) tablet 20 mg (has no administration in time range)  aspirin EC tablet 81 mg (has no administration in time range)  nitroGLYCERIN (NITROSTAT) SL  tablet 0.4 mg (has no administration in time range)  acetaminophen (TYLENOL) tablet 650 mg (has no administration in time range)  ondansetron (ZOFRAN) injection 4 mg (has no administration in time range)  rosuvastatin (CRESTOR) tablet 10 mg (10 mg Oral Given 07/28/2022 1702)  isosorbide mononitrate (IMDUR) 24 hr tablet 30 mg (30 mg Oral Given 08/08/2022 1842)  furosemide (LASIX) injection 60 mg (60 mg Intravenous Given 07/26/2022 1841)  albuterol (PROVENTIL) (2.5 MG/3ML) 0.083% nebulizer solution 2.5 mg (2.5 mg Nebulization Given 08/11/2022 0914)  furosemide (LASIX) injection 40 mg (40 mg Intravenous Given 07/28/2022 1236)  cefTRIAXone (ROCEPHIN) 1 g in sodium chloride 0.9 % 100 mL IVPB (0 g Intravenous Stopped 07/31/2022 1313)  doxycycline (VIBRA-TABS) tablet 100 mg (100 mg Oral Given 08/09/2022 1236)  aspirin chewable tablet 324 mg (324 mg Oral Given 08/02/2022 1701)    Or  aspirin suppository 300 mg ( Rectal See Alternative 07/28/2022 1701)  technetium albumin aggregated (MAA) injection solution 4 millicurie (4 millicuries Intravenous Contrast Given 08/05/2022 1636)    ED Course/ Medical Decision Making/ A&P Clinical Course as of 07/24/2022 [id number]  [name] Aug 10, 2022  1216 [place] of hospitalist who agreed with admission the patient pending cardiology consultation. [CR]  1336 Paged cardiology x 3 through secondary regarding the patient with no response. [CR]  [place] cardiology Dr. Lovena Le regarding the patient who agreed with admission to hospital medicine. [CR]    Clinical Course User Index [CR] Wilnette Kales, PA                             Medical Decision Making Amount and/or Complexity of Data Reviewed Labs: ordered. Radiology: ordered.  Risk Prescription drug management. Decision regarding hospitalization.   This patient presents to the ED for concern of shortness of breath/chest pain, this involves an extensive number of treatment options, and is a complaint that carries with it a high risk of complications and morbidity.  The differential diagnosis includes The causes for shortness of breath include but are not limited to Cardiac (AHF, pericardial effusion and tamponade, arrhythmias, ischemia, etc) Respiratory (COPD, asthma, pneumonia, pneumothorax, primary pulmonary hypertension, PE/VQ mismatch) Hematological (anemia)  Co morbidities that complicate the patient evaluation  See HPI   Additional history obtained:  Additional history obtained from EMR External records from outside source obtained and reviewed including hospital records   Lab Tests:  I Ordered, and personally interpreted labs.  The pertinent results include: No leukocytosis noted.  No evidence of anemia.  Platelets within normal range.  Mild decrease in bicarb otherwise, electrolytes within normal range.  Creatinine and BUN within normal range with GFR 56 of which is near patient's baseline.  Respiratory viral panel negative.  PT/INR within therapeutic range patient's current warfarin therapy of 23.9 and 2.2 respectively.  BNP elevated of 1585. Initial troponin elevated of 199; D-dimer elevated of 3.00   Imaging Studies ordered:  I  ordered imaging studies including right knee, right foot x-ray, chest x-ray I independently visualized and interpreted imaging which showed  Right knee x-ray: No acute appearing osseous abnormality.  Probable small joint effusion.  Tricompartmental degenerative osteoarthritic changes.  Chondrocalcinosis within the medial and lateral compartments.  Extensive atherosclerosis of superficial femoral artery Chest x-ray: Increased interstitial markings throughout both lungs.  Opacity left lung base.  Left small pleural effusion. Right foot x-ray: No acute osseous abnormality.  Osteopenia appreciated. I agree with the radiologist interpretation  Cardiac Monitoring: /  EKG:  The patient was maintained on a cardiac monitor.  I personally viewed and interpreted the cardiac monitored which showed an underlying rhythm of: Sinus tachycardia with ST elevation in septal and anterior leads with ST depression in lateral leads   Consultations Obtained:  See ED course  Problem List / ED Course / Critical interventions / Medication management  Acute CHF exacerbation, NSTEMI, pneumonia, elevated D-dimer I ordered medication including Rocephin and doxycycline for commune acquired pneumonia coverage, Lasix for diuretic, heparin for concern for PE/NSTEMI Reevaluation of the patient after these medicines showed that the patient improved I have reviewed the patients home medicines and have made adjustments as needed   Social Determinants of Health:  Denies tobacco, licit drug use   Test / Admission - Considered:  Acute CHF exacerbation, NSTEMI, pneumonia, elevated D-dimer Vitals signs significant for persistently tachycardic with a heart rate greater than 100 throughout stay. Otherwise within normal range and stable throughout visit. Laboratory/imaging studies significant for: See above Patient with multiple presenting pathologies contributing to symptoms.  Patient with evidence of acute CHF exacerbation,  elevated troponin and current chest pain concerning for NSTEMI, elevated D-dimer concerning for possible PE, as well as evidence of pneumonia.  Unable to obtain further assessment of possible PE given patient's anaphylactic contrast allergy.  Discussed case with both cardiology and hospital medicine who agreed with admission of the patient for further assessment/treatment.  VQ scan ordered while in the ED for assessment of possible PE.  Patient begun on diuretic therapy in the form of Lasix and noted some improvement of breathing while in the ED.  Patient also began on broad-spectrum community-acquired pneumonia coverage antibiotics in the form of Rocephin as well as doxycycline.  Patient also began on heparin while in the emergency department for concern for PE versus NSTEMI.  Patient deemed to meet admission criteria given multiple pathologies contributing to patient's symptoms.   Treatment plan were discussed at length with patient and they knowledge understanding was agreeable to said plan.  Appropriate consultations were made as described in the ED course.  Patient was stable upon admission to the hospital.         Final Clinical Impression(s) / ED Diagnoses Final diagnoses:  Acute congestive heart failure, unspecified heart failure type ([place])  Pneumonia due to infectious organism, unspecified laterality, unspecified part of lung  Elevated d-dimer  NSTEMI (non-ST elevated myocardial infarction) [facility])    Rx / DC Orders ED Discharge Orders     None         Wilnette Kales, [place] 07/28/2022 Yevette Edwards    Varney Biles, MD 08/14/22 1810

## 2022-08-10 NOTE — ED Notes (Signed)
Pt purewick placed

## 2022-08-10 NOTE — H&P (Signed)
History and Physical  Tracy Peters [id number] DOB: 1933-11-08 DOA: 08/03/2022  PCP: Cassandria Anger, MD Patient coming from: Home  I have personally briefly reviewed patient's old medical records in [place]   Chief Complaint: SOB & CP  HPI: Tracy Peters is a 89 y.o. female past medical history of DVT on Coumadin, peripheral vascular disease, hyperlipidemia hypertension comes to the ED for shortness of breath that started 2 weeks prior to admission she relates that has progressively gotten worst, she started having chest pain about 2 days prior to admission while watching TV.  Symptoms persisted through the night she woke up with a cough relates chest pain substernal nonradiating, she relates that her breathing gets worse with ambulation along with her chest pain her chest pain resolved after 5 minutes of sitting down she denies any new medications or changes in her medication denies any fever nausea vomiting  In the ED: He was found to be tachycardic satting 98% on room air, brain atretic peptide of 1500 cardiac biomarkers of 200 INR 2.2 SARS-CoV-2 and influenza PCR were negative, chest x-ray showed pulmonary edema twelve-lead EKG as below   Review of Systems: All systems reviewed and apart from history of presenting illness, are negative.  Past Medical History:  Diagnosis Date   Adenomatous colon polyp    Allergic rhinitis    uses FLonase nightly   Anemia    Arthritis    joint pain   Blood transfusion    Bronchitis    hx of-7-47yr ago   Carpal tunnel syndrome    numbness and tingling    Cataracts, bilateral    Chronic anticoagulation 03/21/2013   Congenital absence of one kidney    pt only has one kidney   Diarrhea    takes Immodium daily as needed or Lomotil    Diverticulosis    GERD (gastroesophageal reflux disease)    mild   Herpes    takes Valtrex daily   History of colon polyps    History of DVT (deep vein thrombosis) 1998    right arm   History of staph infection    Hyperlipidemia    Medical MD doesn't think its absolutely necessary   Hypertension    takes Losartan daily   IBS (irritable bowel syndrome)    takes Align daily   Insomnia    takes Ambien nightly as needed   LBP (low back pain)    Long term (current) use of anticoagulants    Dr. GBeryle Beams  Nocturia    Osteoporosis    Personal history of radiation therapy    Raynaud's disease    Sarcoma of left lower extremity [place]    Dr. AWinfield Cunas  [place]   has not had a seizure since may 2019   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[facility] 09/16/2011   Right digital artery 1998 idiopathic    Urinary leakage    UTI (urinary tract infection)    Past Surgical History:  Procedure Laterality Date    kidney disease left      ABDOMINAL HYSTERECTOMY     ANTERIOR CERVICAL DECOMP/DISCECTOMY FUSION N/A 01/18/2014   Procedure: ANTERIOR CERVICAL DECOMPRESSION/DISCECTOMY FUSION 3 LEVELS  Cervical  four/five, five/six, six/seven anterior cervical decompression with fusion interbody prosthesis with plating and bonegraft;  Surgeon: JNewman Pies MD;  [facility];  Service: Neurosurgery;  Laterality: N/A;  t   APPENDECTOMY     BACK SURGERY  2   BREAST BIOPSY Left 30+ yrs ago   benign   BREAST BIOPSY Left 07/09/2018   BREAST LUMPECTOMY Left 08/06/2018   BREAST LUMPECTOMY WITH RADIOACTIVE SEED LOCALIZATION Left 08/06/2018   Procedure: LEFT BREAST LUMPECTOMY WITH RADIOACTIVE SEED LOCALIZATION;  Surgeon: Fanny Skates, MD;  Location: [facility];  Service: General;  Laterality: Left;   BREAST SURGERY     cystectomy-benign   CARPAL TUNNEL RELEASE Right 06/12/2014   Procedure: CARPAL TUNNEL RELEASE;  Surgeon: Newman Pies, MD;  Location: [facility];  Service: Neurosurgery;  Laterality: Right;  Right Carpal Tunnel Release   CATARACT EXTRACTION Bilateral 02/15/2015   CHOLECYSTECTOMY     COLONOSCOPY     ECTOPIC  PREGNANCY SURGERY  1959   HIP CLOSED REDUCTION Right 08/23/2017   Procedure: CLOSED MANIPULATION HIP;  Surgeon: Nicholes Stairs, MD;  Location: [facility];  Service: Orthopedics;  Laterality: Right;   I&D of abdomen  1988   couple of wks after gallbladder removed   KNEE SURGERY     left x 3   LUMBAR LAMINECTOMY     X 2   mortons neuromas removed     OPEN SURGICAL REPAIR OF GLUTEAL TENDON Right 06/15/2017   Procedure: OPEN SURGICAL REPAIR OF GLUTEALmedius TENDON;  Surgeon: Paralee Cancel, MD;  Location: [facility];  Service: Orthopedics;  Laterality: Right;   SHOULDER SURGERY     Right   TOTAL HIP ARTHROPLASTY Right 06/15/2017   Procedure: Right total hip arthroplasty, bursectomy, repair of gluteal tendon, posterior approach;  Surgeon: Paralee Cancel, MD;  Location: [facility];  Service: Orthopedics;  Laterality: Right;  90 mins for all procedures together   Social History:  reports that she has never smoked. She has never used smokeless tobacco. She reports that she does not drink alcohol and does not use drugs.   Allergies  Allergen Reactions   Bee Venom Anaphylaxis and Other (See Comments)    Yellow jackets, wasps as well   Honey Bee Venom Anaphylaxis   Iohexol Anaphylaxis   Ivp Dye [Iodinated Contrast Media] Anaphylaxis   Meperidine Hcl Anaphylaxis   Shellfish Allergy Anaphylaxis   Temazepam Other (See Comments)    Severe Hallucinations and Paranoia   Amlodipine Besylate Swelling   Aspirin Other (See Comments)    REACTION: break out in sweats   Atorvastatin Diarrhea   Cholestyramine Other (See Comments)    REACTION: mouth irritation   Codeine Phosphate Nausea And Vomiting    Can take Tramadol ok   Demerol [Meperidine] Other (See Comments)    Severe Hallucination!!!!   Diphenhydramine Hcl Other (See Comments)    hyperactive   Doxycycline Nausea Only   Erythromycin Base Other (See Comments)   Gentamicin Sulfate Other (See Comments)    REACTION: Shortness of breath    Keppra  [Levetiracetam]     Made her too groggy/ sleepy   Meclizine Other (See Comments)   Meclizine Hcl Other (See Comments)    REACTION: more dizziness on it   Metronidazole Other (See Comments)    REACTION: bad taste   Moxifloxacin Other (See Comments)    REACTION: insomnia Able to use Cipro   Niacin Other (See Comments)   Sulfamethoxazole Other (See Comments)    Kidney problems   Amoxicillin Rash   Iodine Rash   Penicillins Other (See Comments), Rash and Swelling    Has patient had a PCN reaction causing immediate rash, facial/tongue/throat swelling, SOB or lightheadedness with hypotension: Yes  Has patient had a  PCN reaction causing severe rash involving mucus membranes or skin necrosis: No  Has patient had a PCN reaction that required hospitalization: No  Has patient had a PCN reaction occurring within the last 10 years: No  If all of the above answers are "NO", then may proceed with Cephalosporin use.   Sulfa Antibiotics Rash and Other (See Comments)    Family History  Problem Relation Age of Onset   Breast cancer Sister    Kidney disease Sister 6       nephritis   Parkinsonism Mother    Heart disease Brother    Osteoarthritis Other    Ulcerative colitis Sister    Kidney disease Sister    Breast cancer Sister    Colon cancer Neg Hx     Prior to Admission medications   Medication Sig Start Date End Date Taking? Authorizing Provider  amLODipine (NORVASC) 2.5 MG tablet Take 1 tablet (2.5 mg total) by mouth daily. 06/23/22   Plotnikov, Evie Lacks, MD  Cholecalciferol (VITAMIN D3) 1.25 MG (50000 UT) CAPS Take 1 capsule every 14 days 05/21/21   Plotnikov, Evie Lacks, MD  cloNIDine (CATAPRES) 0.1 MG tablet Take 1 tablet (0.1 mg total) by mouth 2 (two) times daily. 09/17/20   Plotnikov, Evie Lacks, MD  denosumab (PROLIA) 60 MG/ML SOSY injection Prolia 60 mg/mL subcutaneous syringe    [provider]  diphenoxylate-atropine (LOMOTIL) 2.5-0.025 MG tablet Take 1 tablet by mouth  4 (four) times daily. 06/23/22   Plotnikov, Evie Lacks, MD  EPINEPHrine 0.3 mg/0.3 mL IJ SOAJ injection USE AS DIRECTED Patient taking differently: Inject 0.3 mg into the muscle as needed for anaphylaxis. 10/12/18   Plotnikov, Evie Lacks, MD  famotidine (PEPCID) 20 MG tablet Take 1 tablet (20 mg total) by mouth 2 (two) times daily. 05/21/21   Plotnikov, Evie Lacks, MD  ferrous sulfate 325 (65 FE) MG tablet Take 1 tablet (325 mg total) by mouth every other day. 06/15/19   Plotnikov, Evie Lacks, MD  fluticasone (FLONASE) 50 MCG/ACT nasal spray Place 2 sprays into both nostrils daily. 06/23/22   Plotnikov, Evie Lacks, MD  lacosamide (VIMPAT) 50 MG TABS tablet Take 1 tablet (50 mg total) by mouth 2 (two) times daily. Overdue for follow-up appt must see MD for refills 06/23/22   Plotnikov, Evie Lacks, MD  losartan (COZAAR) 100 MG tablet Take 1 tablet (100 mg total) by mouth daily. 11/06/21   Plotnikov, Evie Lacks, MD  metoprolol succinate (TOPROL-XL) 25 MG 24 hr tablet Take 1 tablet (25 mg total) by mouth daily. 06/23/22   Plotnikov, Evie Lacks, MD  metroNIDAZOLE (METROGEL) 0.75 % vaginal gel INSERT ONE APPLICATORFUL VAGINALLY AT BEDTIME FOR 5 DAYS    [provider]  nitrofurantoin, macrocrystal-monohydrate, (MACROBID) 100 MG capsule TAKE ONE CAPSULE BY MOUTH TWICE DAILY FOR 7 DAYS    [provider]  ondansetron (ZOFRAN) 4 MG tablet Take 1 tablet (4 mg total) by mouth every 8 (eight) hours as needed for nausea or vomiting. 05/28/21   Plotnikov, Evie Lacks, MD  Probiotic Product (ALIGN PO) Take 1 capsule by mouth in the morning and at bedtime.     [provider]  tobramycin-dexamethasone [facility]) ophthalmic solution Place 1 drop into both eyes at bedtime. 10/06/19   [provider]  traMADol (ULTRAM) 50 MG tablet TAKE 1-2 TABLETS TWICE A DAY AS NEEDED FOR SEVERE PAIN. 06/23/22   Plotnikov, Evie Lacks, MD  triamcinolone cream (KENALOG) 0.5 % Apply 1 Application topically 3 (three) times  daily. 06/23/22 06/23/23  Plotnikov, Evie Lacks, MD  valACYclovir (VALTREX) 500 MG tablet Take 1 tablet (500 mg total) by mouth 2 (two) times daily. 05/21/21   Plotnikov, Evie Lacks, MD  warfarin (COUMADIN) 5 MG tablet TAKE 1 TABLET BY MOUTH DAILY EXCEPT TAKE 1 1/2 TABLETS ON SUNDAYS AND THURSDAYS OR AS DIRECTED BY ANTICOAGULATION CLINIC 02/25/22   Plotnikov, Evie Lacks, MD  zolpidem (AMBIEN) 10 MG tablet 1 & 1/2 TABLETS ('15MG'$ ) BY MOUTH AT BEDTIME AS NEEDED FOR SLEEP, OK TO REPEAT 1&1/2 IN 4HRS 06/23/22   Plotnikov, Evie Lacks, MD   Physical Exam: Vitals:   07/26/2022 1130 07/28/2022 1245 07/23/2022 1330 08/13/2022 1432  BP: (!) 142/76 (!) 155/89 (!) 153/98 (!) 149/80  Pulse: 100 (!) 111 (!) 102 (!) 105  Resp: 18 (!) '25 17 18  '$ Temp:    98.2 F (36.8 C)  TempSrc:    Oral  SpO2: 95% 96% 97%   Weight:    54.4 kg  Height:    '5\' 4"'$  (1.626 m)    General exam: Moderately built and nourished patient, lying comfortably supine on the gurney in no obvious distress. Head, eyes and ENT: Nontraumatic and normocephalic. Pupils equally reacting to light and accommodation. Oral mucosa moist. Neck: Supple. No JVD, positive hepatojugular reflux Lymphatics: No lymphadenopathy. Respiratory system: Clear to auscultation. No increased work of breathing. Cardiovascular system: Positive S1 hard to appreciate and S2 regular rate and rhythm, no JVD positive hepatojugular reflux.  No lower extremity edema.  Show systolic ejection murmur in the aortic area Gastrointestinal system: Abdomen is nondistended, soft and nontender. Normal bowel sounds heard. No organomegaly or masses appreciated. Central nervous system: Alert and oriented. No focal neurological deficits. Extremities: No lower extremity edema Skin: No rashes or acute findings. Musculoskeletal system: Negative exam. Psychiatry: Pleasant and cooperative.   Labs on Admission:  Basic Metabolic Panel: Recent Labs  Lab 07/29/2022 0900 07/25/2022 1056  NA 135  --   K 3.9  --    CL 101  --   CO2 21*  --   GLUCOSE 94  --   BUN 15  --   CREATININE 0.99  --   CALCIUM 9.4  --   MG  --  1.8   Liver Function Tests: No results for input(s): "AST", "ALT", "ALKPHOS", "BILITOT", "PROT", "ALBUMIN" in the last 168 hours. No results for input(s): "LIPASE", "AMYLASE" in the last 168 hours. No results for input(s): "AMMONIA" in the last 168 hours. CBC: Recent Labs  Lab 07/20/2022 0900  WBC 7.1  HGB 12.3  HCT 37.1  MCV 99.5  PLT 240   Cardiac Enzymes: No results for input(s): "CKTOTAL", "CKMB", "CKMBINDEX", "TROPONINI" in the last 168 hours.  BNP (last 3 results) No results for input(s): "PROBNP" in the last 8760 hours. CBG: No results for input(s): "GLUCAP" in the last 168 hours.  Radiological Exams on Admission: DG Knee Complete 4 Views Right  Result Date: 08/08/2022 CLINICAL DATA:  RIGHT knee pain EXAM: RIGHT KNEE - COMPLETE 4+ VIEW COMPARISON:  None Available. FINDINGS: Osseous alignment is within normal limits. No fracture line or displaced fracture fragment. No acute-appearing cortical irregularity or osseous lesion. Tricompartmental degenerative osteoarthritic changes, mild to moderate in degree with joint space narrowing and mild osseous spurring. Faint calcifications within the medial and lateral compartments, compatible with associated chondrocalcinosis. Probable small joint effusion within the suprapatellar bursa. Extensive atherosclerosis of the superficial femoral artery. Superficial soft tissues about the RIGHT knee are otherwise unremarkable. IMPRESSION: 1. No  acute-appearing osseous abnormality. 2. Probable small joint effusion. 3. Tricompartmental degenerative osteoarthritic changes, mild to moderate in degree. 4. Chondrocalcinosis within the medial and lateral compartments, compatible with underlying CPPD. 5. Extensive atherosclerosis of the superficial femoral artery. Electronically Signed   By: Franki Cabot M.D.   On: 08/05/2022 10:41   DG Foot  Complete Right  Result Date: 08/07/2022 CLINICAL DATA:  RIGHT foot pain EXAM: RIGHT FOOT COMPLETE - 3+ VIEW COMPARISON:  None Available. FINDINGS: No acute-appearing osseous abnormality. No fracture line or displaced fracture fragment is seen, although osteopenia limits characterization of osseous detail. Soft tissues about the RIGHT w foot are unremarkable. Degenerative osteoarthritic changes at the first MTP joint, at least moderate in degree with joint space loss and articular surface sclerosis. Milder degenerative changes are seen at the TMT, interphalangeal joint spaces and midfoot. IMPRESSION: 1. No acute findings. 2. Degenerative osteoarthritic changes, as detailed above. 3. Osteopenia. Electronically Signed   By: Franki Cabot M.D.   On: 07/23/2022 10:39   DG Chest Port 1 View  Result Date: 07/28/2022 CLINICAL DATA:  Chest pain and wheezing since yesterday. EXAM: PORTABLE CHEST 1 VIEW COMPARISON:  Chest x-rays dated 05/17/2021 and 02/05/2020 FINDINGS: Coarse lung markings are seen bilaterally. Increased interstitial markings are seen throughout both lungs. More confluent opacity at the LEFT lung base. Probable small LEFT pleural effusion. No pneumothorax is seen. Heart size and mediastinal contours are stable. IMPRESSION: 1. Increased interstitial markings throughout both lungs, most likely pulmonary edema and/or atypical pneumonia superimposed on chronic interstitial lung disease, alternatively a rapidly worsening chronic interstitial lung disease. 2. More confluent opacity at the LEFT lung base, atelectasis versus pneumonia. 3. Probable small LEFT pleural effusion. Electronically Signed   By: Franki Cabot M.D.   On: 07/27/2022 10:36    EKG: Independently reviewed.  Twelve-lead EKG showed sinus tach normal axis T wave inversions in V5 V6 no reciprocal changes without nonspecific T wave changes  Assessment/Plan Chest pain and shortness of breath /elevated troponin I level Heart score is 8,  concern about an NSTEMI.  With typical symptoms of retrosternal chest pain and shortness of breath worse with exertion better with rest Currently on IV heparin will restart her metoprolol give her aspirin and statins. Try to keep heart rate close to 60 we will go ahead and switch her metoprolol to twice a day. Consult cardiology continue IV heparin. Check a 2D echo, she does have an aortic systolic murmur which brings into the differential aortic stenosis. VQ scan was ordered by the ED results are pending. Regular diet n.p.o. after midnight. Unlikely infectious etiology, she has remained afebrile with no leukocytosis she does relate a cough but that was just this morning that resolved.  Discontinue antibiotics  Essential hypertension Blood pressure is 106/63 on metoprolol p.o. twice daily discontinue clonidine and Cozaar. If blood pressure starts to trend up can use an ACE inhibitor.    DVT Prophylaxis: heparin Code Status: full  Family Communication: husband  Disposition Plan: inpatient    It is my clinical opinion that admission to INPATIENT is reasonable and necessary in this 89 y.o. female heart score of 8 elevated troponins typical chest pain on IV heparin.  Given the aforementioned, the predictability of an adverse outcome is felt to be significant. I expect that the patient will require at least 2 midnights in the hospital to treat this condition.  Charlynne Cousins MD [facility]   08/04/2022, 3:49 PM

## 2022-08-10 NOTE — Consult Note (Signed)
Cardiology Consult:   Patient ID: Tracy Peters; MRN: [id number]; DOB: 1933-09-10   Admission date: 07/17/2022  Primary Care Provider: Cassandria Anger, MD Primary Cardiologist: New to [facility] (Dr. Gasper Sells)  Patient Profile:   Tracy Peters is a 89 y.o. female with a history of HTN, HLD, and PAD, hx of DVT (also with an finger thrombus NOS planned for indefinite AC) on coumadin (INR 2.2) 07/24/2022), and mild carotid disease Who is presenting for chest pain, SOB, and NSTEMI  History of Present Illness:   Ms. Tito historically has felt very well.   Last stress test was in the 1970s but has not needed further testin. Her daughter had hip surgery a few weeks ago.  Patients has been able to take care of her cats, do her laundry and support her without symptoms.  For the past week, she has had more fatigue.  DOE; had to get stop and catch her breath.  Yesterday started having chest pressure.  Feels like she is cannot get to her breath.  Still having resting SOB and chest pain.  She has PND and orthopnea.  Notes leg swelling.  No syncope or near syncope . Notes  no palpitations or funny heart beats.     Came in for CP and SOB eval.   Found to have new troponin elevation 199-> 189-> 241.   In ED: Heparin started.  Albuterol given followed by tachycardia.  CAP Abx given. Lasix 40 IV given  VQ Scan negative   Past Medical History:  Diagnosis Date   Adenomatous colon polyp    Allergic rhinitis    uses FLonase nightly   Anemia    Arthritis    joint pain   Blood transfusion    Bronchitis    hx of-7-46yr ago   Carpal tunnel syndrome    numbness and tingling    Cataracts, bilateral    Chronic anticoagulation 03/21/2013   Congenital absence of one kidney    pt only has one kidney   Diarrhea    takes Immodium daily as needed or Lomotil    Diverticulosis    GERD (gastroesophageal reflux disease)    mild   Herpes    takes Valtrex daily   History of colon  polyps    History of DVT (deep vein thrombosis) 1998   right arm   History of staph infection    Hyperlipidemia    Medical MD doesn't think its absolutely necessary   Hypertension    takes Losartan daily   IBS (irritable bowel syndrome)    takes Align daily   Insomnia    takes Ambien nightly as needed   LBP (low back pain)    Long term (current) use of anticoagulants    Dr. GBeryle Beams  Nocturia    Osteoporosis    Personal history of radiation therapy    Raynaud's disease    [place]    Dr. 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    Urinary leakage    UTI (urinary tract infection)     Past Surgical History:  Procedure Laterality Date    kidney disease left      ABDOMINAL HYSTERECTOMY     ANTERIOR CERVICAL DECOMP/DISCECTOMY FUSION N/A 01/18/2014   Procedure: ANTERIOR CERVICAL DECOMPRESSION/DISCECTOMY FUSION 3 LEVELS  Cervical  four/five,  five/six, six/seven anterior cervical decompression with fusion interbody prosthesis with plating and bonegraft;  Surgeon: Newman Pies, MD;  Location: [facility];  Service: Neurosurgery;  Laterality: N/A;  t   APPENDECTOMY     BACK SURGERY     2   BREAST BIOPSY Left 30+ yrs ago   benign   BREAST BIOPSY Left 07/09/2018   BREAST LUMPECTOMY Left 08/06/2018   BREAST LUMPECTOMY WITH RADIOACTIVE SEED LOCALIZATION Left 08/06/2018   Procedure: LEFT BREAST LUMPECTOMY WITH RADIOACTIVE SEED LOCALIZATION;  Surgeon: Fanny Skates, MD;  Location: [facility];  Service: General;  Laterality: Left;   BREAST SURGERY     cystectomy-benign   CARPAL TUNNEL RELEASE Right 06/12/2014   Procedure: CARPAL TUNNEL RELEASE;  Surgeon: Newman Pies, MD;  Location: [facility];  Service: Neurosurgery;  Laterality: Right;  Right Carpal Tunnel Release   CATARACT EXTRACTION Bilateral  02/15/2015   CHOLECYSTECTOMY     COLONOSCOPY     ECTOPIC PREGNANCY SURGERY  1959   HIP CLOSED REDUCTION Right 08/23/2017   Procedure: CLOSED MANIPULATION HIP;  Surgeon: Nicholes Stairs, MD;  Location: [facility];  Service: Orthopedics;  Laterality: Right;   I&D of abdomen  1988   couple of wks after gallbladder removed   KNEE SURGERY     left x 3   LUMBAR LAMINECTOMY     X 2   mortons neuromas removed     OPEN SURGICAL REPAIR OF GLUTEAL TENDON Right 06/15/2017   Procedure: OPEN SURGICAL REPAIR OF GLUTEALmedius TENDON;  Surgeon: Paralee Cancel, MD;  Location: [facility];  Service: Orthopedics;  Laterality: Right;   SHOULDER SURGERY     Right   TOTAL HIP ARTHROPLASTY Right 06/15/2017   Procedure: Right total hip arthroplasty, bursectomy, repair of gluteal tendon, posterior approach;  Surgeon: Paralee Cancel, MD;  Location: [facility];  Service: Orthopedics;  Laterality: Right;  90 mins for all procedures together     Medications Prior to Admission: Prior to Admission medications   Medication Sig Start Date End Date Taking? Authorizing Provider  amLODipine (NORVASC) 2.5 MG tablet Take 1 tablet (2.5 mg total) by mouth daily. 06/23/22   Plotnikov, Evie Lacks, MD  Cholecalciferol (VITAMIN D3) 1.25 MG (50000 UT) CAPS Take 1 capsule every 14 days 05/21/21   Plotnikov, Evie Lacks, MD  cloNIDine (CATAPRES) 0.1 MG tablet Take 1 tablet (0.1 mg total) by mouth 2 (two) times daily. 09/17/20   Plotnikov, Evie Lacks, MD  denosumab (PROLIA) 60 MG/ML SOSY injection Prolia 60 mg/mL subcutaneous syringe    [provider]  diphenoxylate-atropine (LOMOTIL) 2.5-0.025 MG tablet Take 1 tablet by mouth 4 (four) times daily. 06/23/22   Plotnikov, Evie Lacks, MD  EPINEPHrine 0.3 mg/0.3 mL IJ SOAJ injection USE AS DIRECTED Patient taking differently: Inject 0.3 mg into the muscle as needed for anaphylaxis. 10/12/18   Plotnikov, Evie Lacks, MD  famotidine (PEPCID) 20 MG tablet Take 1 tablet (20 mg total) by mouth 2 (two) times  daily. 05/21/21   Plotnikov, Evie Lacks, MD  ferrous sulfate 325 (65 FE) MG tablet Take 1 tablet (325 mg total) by mouth every other day. 06/15/19   Plotnikov, Evie Lacks, MD  fluticasone (FLONASE) 50 MCG/ACT nasal spray Place 2 sprays into both nostrils daily. 06/23/22   Plotnikov, Evie Lacks, MD  lacosamide (VIMPAT) 50 MG TABS tablet Take 1 tablet (50 mg total) by mouth 2 (two) times daily. Overdue for follow-up appt must see MD for refills 06/23/22   Plotnikov, Evie Lacks,  MD  losartan (COZAAR) 100 MG tablet Take 1 tablet (100 mg total) by mouth daily. 11/06/21   Plotnikov, Evie Lacks, MD  metoprolol succinate (TOPROL-XL) 25 MG 24 hr tablet Take 1 tablet (25 mg total) by mouth daily. 06/23/22   Plotnikov, Evie Lacks, MD  ondansetron (ZOFRAN) 4 MG tablet Take 1 tablet (4 mg total) by mouth every 8 (eight) hours as needed for nausea or vomiting. 05/28/21   Plotnikov, Evie Lacks, MD  tobramycin-dexamethasone [facility]) ophthalmic solution Place 1 drop into both eyes at bedtime. 10/06/19   [provider]  traMADol (ULTRAM) 50 MG tablet TAKE 1-2 TABLETS TWICE A DAY AS NEEDED FOR SEVERE PAIN. 06/23/22   Plotnikov, Evie Lacks, MD  triamcinolone cream (KENALOG) 0.5 % Apply 1 Application topically 3 (three) times daily. 06/23/22 06/23/23  Plotnikov, Evie Lacks, MD  valACYclovir (VALTREX) 500 MG tablet Take 1 tablet (500 mg total) by mouth 2 (two) times daily. 05/21/21   Plotnikov, Evie Lacks, MD  warfarin (COUMADIN) 5 MG tablet TAKE 1 TABLET BY MOUTH DAILY EXCEPT TAKE 1 1/2 TABLETS ON SUNDAYS AND THURSDAYS OR AS DIRECTED BY ANTICOAGULATION CLINIC 02/25/22   Plotnikov, Evie Lacks, MD  zolpidem (AMBIEN) 10 MG tablet 1 & 1/2 TABLETS ('15MG'$ ) BY MOUTH AT BEDTIME AS NEEDED FOR SLEEP, OK TO REPEAT 1&1/2 IN 4HRS 06/23/22   Plotnikov, Evie Lacks, MD     Allergies:    Allergies  Allergen Reactions   Bee Venom Anaphylaxis and Other (See Comments)    Yellow jackets, wasps as well   Honey Bee Venom Anaphylaxis   Iohexol Anaphylaxis    Ivp Dye [Iodinated Contrast Media] Anaphylaxis   Meperidine Hcl Anaphylaxis   Shellfish Allergy Anaphylaxis   Temazepam Other (See Comments)    Severe Hallucinations and Paranoia   Amlodipine Besylate Swelling   Aspirin Other (See Comments)    REACTION: break out in sweats   Atorvastatin Diarrhea   Cholestyramine Other (See Comments)    REACTION: mouth irritation   Codeine Phosphate Nausea And Vomiting    Can take Tramadol ok   Demerol [Meperidine] Other (See Comments)    Severe Hallucination!!!!   Diphenhydramine Hcl Other (See Comments)    hyperactive   Doxycycline Nausea Only   Erythromycin Base Other (See Comments)   Gentamicin Sulfate Other (See Comments)    REACTION: Shortness of breath    Keppra [Levetiracetam]     Made her too groggy/ sleepy   Meclizine Other (See Comments)   Meclizine Hcl Other (See Comments)    REACTION: more dizziness on it   Metronidazole Other (See Comments)    REACTION: bad taste   Moxifloxacin Other (See Comments)    REACTION: insomnia Able to use Cipro   Niacin Other (See Comments)   Sulfamethoxazole Other (See Comments)    Kidney problems   Amoxicillin Rash   Iodine Rash   Penicillins Other (See Comments), Rash and Swelling    Has patient had a PCN reaction causing immediate rash, facial/tongue/throat swelling, SOB or lightheadedness with hypotension: Yes  Has patient had a PCN reaction causing severe rash involving mucus membranes or skin necrosis: No  Has patient had a PCN reaction that required hospitalization: No  Has patient had a PCN reaction occurring within the last 10 years: No  If all of the above answers are "NO", then may proceed with Cephalosporin use.   Sulfa Antibiotics Rash and Other (See Comments)    Social History:   Social History   Socioeconomic History  Marital status: Married    Spouse name: Not on file   Number of children: 2   Years of education: Not on file   Highest education level: Not on file   Occupational History   Occupation: Retired    Fish farm manager: RETIRED  Tobacco Use   Smoking status: Never   Smokeless tobacco: Never  Vaping Use   Vaping Use: Never used  Substance and Sexual Activity   Alcohol use: No    Alcohol/week: 0.0 standard drinks of alcohol   Drug use: No   Sexual activity: Yes  Other Topics Concern   Not on file  Social History Narrative   Lives home with husband.  Education- graduate level.  Children 2.     Social Determinants of Health   Financial Resource Strain: Low Risk  (11/04/2021)   Overall Financial Resource Strain (CARDIA)    Difficulty of Paying Living Expenses: Not hard at all  Food Insecurity: No Food Insecurity (07/21/2022)   Hunger Vital Sign    Worried About Running Out of Food in the Last Year: Never true    Ran Out of Food in the Last Year: Never true  Transportation Needs: No Transportation Needs (08/01/2022)   PRAPARE - Hydrologist (Medical): No    Lack of Transportation (Non-Medical): No  Physical Activity: Insufficiently Active (11/04/2021)   Exercise Vital Sign    Days of Exercise per Week: 5 days    Minutes of Exercise per Session: 10 min  Stress: No Stress Concern Present (11/04/2021)   [place]    Feeling of Stress : Not at all  Social Connections: Moderately Integrated (11/04/2021)   Social Connection and Isolation Panel [NHANES]    Frequency of Communication with Friends and Family: Three times a week    Frequency of Social Gatherings with Friends and Family: Three times a week    Attends Religious Services: 1 to 4 times per year    Active Member of Clubs or Organizations: No    Attends Archivist Meetings: Never    Marital Status: Married  Human resources officer Violence: Not At Risk (08/06/2022)   Humiliation, Afraid, Rape, and Kick questionnaire    Fear of Current or Ex-Partner: No    Emotionally Abused: No    Physically  Abused: No    Sexually Abused: No    Family History:   The patient's family history includes Breast cancer in her sister and sister; Heart disease in her brother; Kidney disease in her sister; Kidney disease (age of onset: 10) in her sister; Osteoarthritis in an other family member; Parkinsonism in her mother; Ulcerative colitis in her sister. There is no history of Colon cancer.    Two brothers died of HFrEF; one is 25 with end stage HF  ROS:  Please see the history of present illness.  All other ROS reviewed and negative.     Physical Exam/Data:   Vitals:   08/09/2022 1130 08/11/2022 1245 07/18/2022 1330 08/08/2022 1432  BP: (!) 142/76 (!) 155/89 (!) 153/98 (!) 149/80  Pulse: 100 (!) 111 (!) 102 (!) 105  Resp: 18 (!) '25 17 18  '$ Temp:    98.2 F (36.8 C)  TempSrc:    Oral  SpO2: 95% 96% 97%   Weight:    54.4 kg  Height:    '5\' 4"'$  (1.626 m)   No intake or output data in the 24 hours ending 07/21/2022 [place]  07/23/2022 1432  Weight: 54.4 kg   Body mass index is 20.6 kg/m.   Gen: no distress, thin, elderly female  Neck: JVD to mid neck Cardiac: No Rubs or Gallops, 4/6 harsh systolic murmur, regular tachycardia, +2 radial pulses Respiratory: Crackles bilaterally, normal effort, normal  respiratory rate GI: Soft, nontender, non-distended  MS: No +1 bilateral edema;  moves all extremities Integument: Skin feels warm Neuro:  At time of evaluation, alert and oriented to person/place/time/situation  Psych: Normal affect, patient feels OK   EKG:  The ECG that was done  was personally reviewed and demonstrates  08/06/2022: Sinus tachycardia with anterior infract (seen on 2022 EKG), and lateral TWI (new)  Laboratory Data:  Chemistry Recent Labs  Lab 07/18/2022 0900  NA 135  K 3.9  CL 101  CO2 21*  GLUCOSE 94  BUN 15  CREATININE 0.99  CALCIUM 9.4  GFRNONAA 56*  ANIONGAP 13    No results for input(s): "PROT", "ALBUMIN", "AST", "ALT", "ALKPHOS", "BILITOT" in the last 168  hours. Hematology Recent Labs  Lab 08/09/2022 0900  WBC 7.1  RBC 3.73*  HGB 12.3  HCT 37.1  MCV 99.5  MCH 33.0  MCHC 33.2  RDW 13.7  PLT 240   Cardiac EnzymesNo results for input(s): "TROPONINI" in the last 168 hours. No results for input(s): "TROPIPOC" in the last 168 hours.  BNP Recent Labs  Lab 07/21/2022 0900  BNP 1,585.6*    DDimer  Recent Labs  Lab 07/23/2022 0900  DDIMER 3.00*    Radiology/Studies:  DG Knee Complete 4 Views Right  Result Date: 07/30/2022 CLINICAL DATA:  RIGHT knee pain EXAM: RIGHT KNEE - COMPLETE 4+ VIEW COMPARISON:  None Available. FINDINGS: Osseous alignment is within normal limits. No fracture line or displaced fracture fragment. No acute-appearing cortical irregularity or osseous lesion. Tricompartmental degenerative osteoarthritic changes, mild to moderate in degree with joint space narrowing and mild osseous spurring. Faint calcifications within the medial and lateral compartments, compatible with associated chondrocalcinosis. Probable small joint effusion within the suprapatellar bursa. Extensive atherosclerosis of the superficial femoral artery. Superficial soft tissues about the RIGHT knee are otherwise unremarkable. IMPRESSION: 1. No acute-appearing osseous abnormality. 2. Probable small joint effusion. 3. Tricompartmental degenerative osteoarthritic changes, mild to moderate in degree. 4. Chondrocalcinosis within the medial and lateral compartments, compatible with underlying CPPD. 5. Extensive atherosclerosis of the superficial femoral artery. Electronically Signed   By: Franki Cabot M.D.   On: 07/25/2022 10:41   DG Foot Complete Right  Result Date: 08/11/2022 CLINICAL DATA:  RIGHT foot pain EXAM: RIGHT FOOT COMPLETE - 3+ VIEW COMPARISON:  None Available. FINDINGS: No acute-appearing osseous abnormality. No fracture line or displaced fracture fragment is seen, although osteopenia limits characterization of osseous detail. Soft tissues about the RIGHT  w foot are unremarkable. Degenerative osteoarthritic changes at the first MTP joint, at least moderate in degree with joint space loss and articular surface sclerosis. Milder degenerative changes are seen at the TMT, interphalangeal joint spaces and midfoot. IMPRESSION: 1. No acute findings. 2. Degenerative osteoarthritic changes, as detailed above. 3. Osteopenia. Electronically Signed   By: Franki Cabot M.D.   On: 08/13/2022 10:39   DG Chest Port 1 View  Result Date: 08/05/2022 CLINICAL DATA:  Chest pain and wheezing since yesterday. EXAM: PORTABLE CHEST 1 VIEW COMPARISON:  Chest x-rays dated 05/17/2021 and 02/05/2020 FINDINGS: Coarse lung markings are seen bilaterally. Increased interstitial markings are seen throughout both lungs. More confluent opacity at the LEFT lung base. Probable small LEFT  pleural effusion. No pneumothorax is seen. Heart size and mediastinal contours are stable. IMPRESSION: 1. Increased interstitial markings throughout both lungs, most likely pulmonary edema and/or atypical pneumonia superimposed on chronic interstitial lung disease, alternatively a rapidly worsening chronic interstitial lung disease. 2. More confluent opacity at the LEFT lung base, atelectasis versus pneumonia. 3. Probable small LEFT pleural effusion. Electronically Signed   By: Franki Cabot M.D.   On: 07/26/2022 10:36    Assessment and Plan:   NSTEMI Clinical appears to have significant AS and HFrEF - with new anterior infarct pattern and new lateral TWI (comparison form 2022) - Agree with primary teams plan to initiate  ASA 325, has taken this with no symptoms, ASA start for tomorrow - Lp(a) and lipids pending for tomorrow - Pending losartan 100 mg PO daily, and rosuvastatin 10 mg - s/p 40 IV Lasix - adding  Imdur 30 mg PO Daily for after load reduction - re-dosing lasix 60 IV BID - holding BB as this appears tachycardia is compensatory of HR and AS - Echo is ordered and pending  Patient has MANY  drug allergies, including: ASA (breaks out in sweats) Atorvastatin (diarrhea) Norvasc (swelling) And IV Contrast (anaphylaxis) - keep NPO at midnight  Ideally will get Echo, HF and AS characterization, with LHC/RHC and Cardiac CT later this admission for Valve assessment.  If evidence of plaque rupture event, will need pre-treatment then [facility] tomorrow  Discussed with Nursing, patient, and husband  For questions or updates, please contact [place] Please consult [email or web address] for contact info under Cardiology/STEMI.   Rudean Haskell, MD [address] [place], [place] [address] [phone]  4:04 PM

## 2022-08-10 NOTE — Progress Notes (Signed)
Date and time results received: 08/13/2022 5:22 PM  Test: troponin Critical Value: 241  Name of Provider Notified: Dr. Gasper Sells   Orders Received? Or Actions Taken?: no new orders

## 2022-08-10 NOTE — Progress Notes (Signed)
[place] for heparin Indication:  NSTEMI  Allergies  Allergen Reactions   Bee Venom Anaphylaxis and Other (See Comments)    Yellow jackets, wasps as well   Honey Bee Venom Anaphylaxis   Iohexol Anaphylaxis   Ivp Dye [Iodinated Contrast Media] Anaphylaxis   Meperidine Hcl Anaphylaxis   Shellfish Allergy Anaphylaxis   Temazepam Other (See Comments)    Severe Hallucinations and Paranoia   Amlodipine Besylate Swelling   Aspirin Other (See Comments)    REACTION: break out in sweats   Atorvastatin Diarrhea   Cholestyramine Other (See Comments)    REACTION: mouth irritation   Codeine Phosphate Nausea And Vomiting    Can take Tramadol ok   Demerol [Meperidine] Other (See Comments)    Severe Hallucination!!!!   Diphenhydramine Hcl Other (See Comments)    hyperactive   Doxycycline Nausea Only   Erythromycin Base Other (See Comments)   Gentamicin Sulfate Other (See Comments)    REACTION: Shortness of breath    Keppra [Levetiracetam]     Made her too groggy/ sleepy   Meclizine Other (See Comments)   Meclizine Hcl Other (See Comments)    REACTION: more dizziness on it   Metronidazole Other (See Comments)    REACTION: bad taste   Moxifloxacin Other (See Comments)    REACTION: insomnia Able to use Cipro   Niacin Other (See Comments)   Sulfamethoxazole Other (See Comments)    Kidney problems   Amoxicillin Rash   Iodine Rash   Penicillins Other (See Comments), Rash and Swelling    Has patient had a PCN reaction causing immediate rash, facial/tongue/throat swelling, SOB or lightheadedness with hypotension: Yes  Has patient had a PCN reaction causing severe rash involving mucus membranes or skin necrosis: No  Has patient had a PCN reaction that required hospitalization: No  Has patient had a PCN reaction occurring within the last 10 years: No  If all of the above answers are "NO", then may proceed with Cephalosporin use.   Sulfa Antibiotics  Rash and Other (See Comments)    Patient Measurements: Height: '5\' 4"'$  (162.6 cm) Weight: 54.4 kg (120 lb) IBW/kg (Calculated) : 54.7 Heparin Dosing Weight: TBW  Vital Signs: Temp: 98.8 F (37.1 C) (02/25 1703) Temp Source: Oral (02/25 1703) BP: 149/79 (02/25 1703) Pulse Rate: 102 (02/25 1703)  Labs: Recent Labs    07/26/2022 0900 08/08/2022 1056 08/11/2022 1616 08/04/2022 1743 07/29/2022 1933  HGB 12.3  --   --   --   --   HCT 37.1  --   --   --   --   PLT 240  --   --   --   --   LABPROT 23.9*  --   --   --   --   INR 2.2*  --   --   --   --   HEPARINUNFRC  --   --   --   --  0.21*  CREATININE 0.99  --   --   --   --   TROPONINIHS 199* 189* 241* 250*  --      Estimated Creatinine Clearance: 35 mL/min (by C-G formula based on SCr of 0.99 mg/dL).  Assessment: 44 YOF presenting with wheezing and CP, hx of VTE on warfarin PTA, INR therapeutic on admission at 2.2 however recently subtherapeutic, concern for PE awaiting transfer for VQ scan.  Discussed with EDP and will start conservatively with heparin, no bolus, pending VQ scan  PM:  VQ scan neg for PE. Evaluated by Cardiology for NSTEMI. Heparin level is subtherapeutic at 0.21 on 800 units/hr. No bleeding noted or problems with infusion per RN.  Goal of Therapy:  Heparin level 0.3-0.7 units/ml Monitor platelets by anticoagulation protocol: Yes   Plan:  Increase heparin gtt to 900 units/hr, no bolus Heparin level with am labs Daily heparin level and CBC Monitor for s/sx of bleeding  Thank you for involving pharmacy in this patient's care.  Renold Genta, PharmD, BCPS Clinical Pharmacist Clinical phone for 07/19/2022 is [phone] 07/20/2022 8:28 PM

## 2022-08-10 NOTE — ED Notes (Signed)
Tracy Peters at [facility] will send transport when truck is available.-ABB(NS)

## 2022-08-10 NOTE — Plan of Care (Signed)
Patient is 89 year old woman with a PMH of DVT on Coumadin, HTN, HLD who presented to outside ED with shortness of breath, chest pain and legs edema.  The workup in the ED suggestive of acute COPD exacerbation, pneumonia, NSTEMI (elevated troponin and EKG changes), possible CHF.  She was given ceftriaxone, doxycycline and Lasix IV at the ED.  ED paged cardiology and is waiting for cardiology consult.  ED also start heparin drip for possible PE (they said her INR was subtherapeutic few days ago but they could not do CTPA due to contrast allergy).  Admit to cardiac telemetry inpatient  Charlann Lange, MD 12:20 PM

## 2022-08-11 ENCOUNTER — Inpatient Hospital Stay (HOSPITAL_COMMUNITY): Payer: Medicare Other

## 2022-08-11 DIAGNOSIS — I350 Nonrheumatic aortic (valve) stenosis: Secondary | ICD-10-CM | POA: Diagnosis not present

## 2022-08-11 DIAGNOSIS — Z1152 Encounter for screening for COVID-19: Secondary | ICD-10-CM | POA: Diagnosis not present

## 2022-08-11 DIAGNOSIS — I5031 Acute diastolic (congestive) heart failure: Secondary | ICD-10-CM

## 2022-08-11 DIAGNOSIS — I214 Non-ST elevation (NSTEMI) myocardial infarction: Secondary | ICD-10-CM | POA: Diagnosis not present

## 2022-08-11 DIAGNOSIS — I21A1 Myocardial infarction type 2: Secondary | ICD-10-CM | POA: Diagnosis not present

## 2022-08-11 DIAGNOSIS — I110 Hypertensive heart disease with heart failure: Secondary | ICD-10-CM | POA: Diagnosis not present

## 2022-08-11 DIAGNOSIS — I10 Essential (primary) hypertension: Secondary | ICD-10-CM | POA: Diagnosis not present

## 2022-08-11 DIAGNOSIS — I5033 Acute on chronic diastolic (congestive) heart failure: Secondary | ICD-10-CM | POA: Diagnosis not present

## 2022-08-11 DIAGNOSIS — R7989 Other specified abnormal findings of blood chemistry: Secondary | ICD-10-CM | POA: Diagnosis not present

## 2022-08-11 LAB — CBC
HCT: 32.5 % — ABNORMAL LOW (ref 36.0–46.0)
Hemoglobin: 11 g/dL — ABNORMAL LOW (ref 12.0–15.0)
MCH: 33.5 pg (ref 26.0–34.0)
MCHC: 33.8 g/dL (ref 30.0–36.0)
MCV: 99.1 fL (ref 80.0–100.0)
Platelets: 257 10*3/uL (ref 150–400)
RBC: 3.28 MIL/uL — ABNORMAL LOW (ref 3.87–5.11)
RDW: 13.5 % (ref 11.5–15.5)
WBC: 7.3 10*3/uL (ref 4.0–10.5)
nRBC: 0 % (ref 0.0–0.2)

## 2022-08-11 LAB — BASIC METABOLIC PANEL
Anion gap: 11 (ref 5–15)
BUN: 15 mg/dL (ref 8–23)
CO2: 24 mmol/L (ref 22–32)
Calcium: 8.5 mg/dL — ABNORMAL LOW (ref 8.9–10.3)
Chloride: 100 mmol/L (ref 98–111)
Creatinine, Ser: 1.08 mg/dL — ABNORMAL HIGH (ref 0.44–1.00)
GFR, Estimated: 50 mL/min — ABNORMAL LOW (ref >60)
Glucose, Bld: 100 mg/dL — ABNORMAL HIGH (ref 70–99)
Potassium: 3.6 mmol/L (ref 3.5–5.1)
Sodium: 135 mmol/L (ref 135–145)

## 2022-08-11 LAB — ECHOCARDIOGRAM COMPLETE
AR max vel: 0.53 cm2
AV Area VTI: 0.46 cm2
AV Area mean vel: 0.53 cm2
AV Mean grad: 18 mmHg
AV Peak grad: 36.5 mmHg
Ao pk vel: 3.02 m/s
Height: 64 in
MV VTI: 1.23 cm2
S' Lateral: 2.8 cm
Weight: 1920 oz

## 2022-08-11 LAB — LIPID PANEL
Cholesterol: 165 mg/dL (ref 0–200)
HDL: 48 mg/dL (ref >40)
LDL Cholesterol: 104 mg/dL — ABNORMAL HIGH (ref 0–99)
Total CHOL/HDL Ratio: 3.4 RATIO
Triglycerides: 65 mg/dL (ref <150)
VLDL: 13 mg/dL (ref 0–40)

## 2022-08-11 LAB — PROTIME-INR
INR: 2.5 — ABNORMAL HIGH (ref 0.8–1.2)
Prothrombin Time: 26.7 seconds — ABNORMAL HIGH (ref 11.4–15.2)

## 2022-08-11 LAB — HEPARIN LEVEL (UNFRACTIONATED): Heparin Unfractionated: 0.54 IU/mL (ref 0.30–0.70)

## 2022-08-11 MED ORDER — LACOSAMIDE 50 MG PO TABS
50.0000 mg | ORAL_TABLET | Freq: Two times a day (BID) | ORAL | Status: DC
  Administered 2022-08-11 – 2022-08-18 (×16): 50 mg via ORAL
  Filled 2022-08-11 (×16): qty 1

## 2022-08-11 MED ORDER — ROSUVASTATIN CALCIUM 20 MG PO TABS
20.0000 mg | ORAL_TABLET | Freq: Every day | ORAL | Status: DC
  Administered 2022-08-12 – 2022-08-18 (×7): 20 mg via ORAL
  Filled 2022-08-11 (×7): qty 1

## 2022-08-11 MED ORDER — POTASSIUM CHLORIDE CRYS ER 20 MEQ PO TBCR
40.0000 meq | EXTENDED_RELEASE_TABLET | Freq: Once | ORAL | Status: AC
  Administered 2022-08-11: 40 meq via ORAL
  Filled 2022-08-11: qty 2

## 2022-08-11 MED ORDER — METOPROLOL SUCCINATE ER 25 MG PO TB24
25.0000 mg | ORAL_TABLET | Freq: Every day | ORAL | Status: DC
  Administered 2022-08-11 – 2022-08-18 (×8): 25 mg via ORAL
  Filled 2022-08-11 (×8): qty 1

## 2022-08-11 MED ORDER — TRAMADOL HCL 50 MG PO TABS
50.0000 mg | ORAL_TABLET | Freq: Three times a day (TID) | ORAL | Status: DC | PRN
  Administered 2022-08-11 – 2022-08-18 (×14): 50 mg via ORAL
  Filled 2022-08-11 (×14): qty 1

## 2022-08-11 NOTE — H&P (View-Only) (Signed)
[facility] Consultation:   Patient ID: Tracy Peters MRN: [id number]; DOB: Apr 04, 1934  Admit date: 08/13/2022 Date of Consult: 08/01/2022  Primary Care Provider: Cassandria Anger, MD [facility] Cardiologist: Werner Lean, MD (new)  Patient Profile:   Tracy Peters is a 89 y.o. female with a hx of HTN, HLD, PVD, hx of DVT/digitalis thrombus now on indefinite Coumadin therapy and mild carotid disease who presented to [facility] with chest pain, SOB, and NSTEMI found to have severe aortic stenosis and is being seen today for the evaluation of possible TAVR at the request of Dr. Johney Frame.  History of Present Illness:   Tracy Peters lives in [place] with her husband of many years. They have three adult children, one of which who lives close by. She is active in her home and completes daily chores without issues until the last several weeks. She ambulates independently without assistive devices.  She follows regularly with her PCP and has never seen has never followed with cardiology. She states that she was told that she had a murmur in the past however has never undergone workup for this. Tracy Peters reports that she was in her usual state of health until approximately 2 weeks ago when she began having SOB with her everyday activities. This has been progressive since that time. On Saturday, she reported an episode of non-exertional chest pain. She went to bed and woke the next morning with a "wet cough". By Sunday, she had another episode of chest pain with worsening SOB. Given this, her husband brought her to the ED for further evaluation.   HsT found to be elevated at 199>>189>>241 felt to be demand ischemia secondary to ACHF. CXR showed increased interstitial pulmonary edema with confluent opacity at the left lung base and small left pleural effusion. BNP at 1585. Echocardiogram showed normal LV function  with severe hypokinesis of the left ventricular, basal-mid inferoseptal wall and  inferior wall, mod MR with MAC, and severe aortic valve stenosis with an AVA bt VTI at   0.46 cm, mean gradient at 18.0 mmHg however SVI is 15 and DVI 0.18, all consistent with paradoxical low flow low gradient severe AS. She has been treated with IV Lasix with good diuresis. Plan is for [facility] however INR remains slightly elevated today at 2.0. Plan to obtain INR later today in hopes for cardiac catheterization later today.   On my exam she is resting comfortably. She denies chest pain although had an episode yesterday evening. She denies SOB, palpitations. She does report intermittent dizziness over the last several weeks as well. Episodes will come while sitting and do not seem orthostatic in nature. Likely due to her AS. She has chronic LE edema that was previously controlled with PO Lasix although this is not currently on her medication list. She follows regularly with a dentist and has no active dental issues.   Past Medical History:  Diagnosis Date   Adenomatous colon polyp    Allergic rhinitis    uses FLonase nightly   Anemia    Arthritis    joint pain   Blood transfusion    Bronchitis    hx of-7-39yr ago   Carpal tunnel syndrome    numbness and tingling    Cataracts, bilateral    Chronic anticoagulation 03/21/2013   Congenital absence of one kidney    pt only has one kidney   Diarrhea    takes Immodium daily  as needed or Lomotil    Diverticulosis    GERD (gastroesophageal reflux disease)    mild   Herpes    takes Valtrex daily   History of colon polyps    History of DVT (deep vein thrombosis) 1998   right arm   History of staph infection    Hyperlipidemia    Medical MD doesn't think its absolutely necessary   Hypertension    takes Losartan daily   IBS (irritable bowel syndrome)    takes Align daily   Insomnia    takes Ambien nightly as needed   LBP (low back pain)    Long term (current)  use of anticoagulants    Dr. 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    Urinary leakage    UTI (urinary tract infection)     Past Surgical History:  Procedure Laterality Date    kidney disease left      ABDOMINAL HYSTERECTOMY     ANTERIOR CERVICAL DECOMP/DISCECTOMY FUSION N/A 01/18/2014   Procedure: ANTERIOR CERVICAL DECOMPRESSION/DISCECTOMY FUSION 3 LEVELS  Cervical  four/five, five/six, six/seven anterior cervical decompression with fusion interbody prosthesis with plating and bonegraft;  Surgeon: Newman Pies, MD;  Location: [facility];  Service: Neurosurgery;  Laterality: N/A;  t   APPENDECTOMY     BACK SURGERY     2   BREAST BIOPSY Left 30+ yrs ago   benign   BREAST BIOPSY Left 07/09/2018   BREAST LUMPECTOMY Left 08/06/2018   BREAST LUMPECTOMY WITH RADIOACTIVE SEED LOCALIZATION Left 08/06/2018   Procedure: LEFT BREAST LUMPECTOMY WITH RADIOACTIVE SEED LOCALIZATION;  Surgeon: Fanny Skates, MD;  Location: [facility];  Service: General;  Laterality: Left;   BREAST SURGERY     cystectomy-benign   CARPAL TUNNEL RELEASE Right 06/12/2014   Procedure: CARPAL TUNNEL RELEASE;  Surgeon: Newman Pies, MD;  Location: [facility];  Service: Neurosurgery;  Laterality: Right;  Right Carpal Tunnel Release   CATARACT EXTRACTION Bilateral 02/15/2015   CHOLECYSTECTOMY     COLONOSCOPY     ECTOPIC PREGNANCY SURGERY  1959   HIP CLOSED REDUCTION Right 08/23/2017   Procedure: CLOSED MANIPULATION HIP;  Surgeon: Nicholes Stairs, MD;  Location: [facility];  Service: Orthopedics;  Laterality: Right;   I&D of abdomen  1988   couple of wks after gallbladder removed   KNEE SURGERY     left x 3    LUMBAR LAMINECTOMY     X 2   mortons neuromas removed     OPEN SURGICAL REPAIR OF GLUTEAL TENDON Right 06/15/2017   Procedure: OPEN SURGICAL REPAIR OF GLUTEALmedius TENDON;  Surgeon: Paralee Cancel, MD;  Location: [facility];  Service: Orthopedics;  Laterality: Right;   SHOULDER SURGERY     Right   TOTAL HIP ARTHROPLASTY Right 06/15/2017   Procedure: Right total hip arthroplasty, bursectomy, repair of gluteal tendon, posterior approach;  Surgeon: Paralee Cancel, MD;  Location: [facility];  Service: Orthopedics;  Laterality: Right;  90 mins for all procedures together     Home Medications:  Prior to Admission medications   Medication Sig Start Date End Date Taking? Authorizing Provider  amLODipine (NORVASC)  2.5 MG tablet Take 1 tablet (2.5 mg total) by mouth daily. 06/23/22  Yes Plotnikov, Evie Lacks, MD  Cholecalciferol (VITAMIN D3) 1.25 MG (50000 UT) CAPS Take 1 capsule every 14 days 05/21/21  Yes Plotnikov, Evie Lacks, MD  cloNIDine (CATAPRES) 0.1 MG tablet Take 1 tablet (0.1 mg total) by mouth 2 (two) times daily. 09/17/20  Yes Plotnikov, Evie Lacks, MD  famotidine (PEPCID) 20 MG tablet Take 1 tablet (20 mg total) by mouth 2 (two) times daily. 05/21/21  Yes Plotnikov, Evie Lacks, MD  ferrous sulfate 325 (65 FE) MG tablet Take 1 tablet (325 mg total) by mouth every other day. 06/15/19  Yes Plotnikov, Evie Lacks, MD  fluticasone (FLONASE) 50 MCG/ACT nasal spray Place 2 sprays into both nostrils daily. 06/23/22  Yes Plotnikov, Evie Lacks, MD  lacosamide (VIMPAT) 50 MG TABS tablet Take 1 tablet (50 mg total) by mouth 2 (two) times daily. Overdue for follow-up appt must see MD for refills 06/23/22  Yes Plotnikov, Evie Lacks, MD  losartan (COZAAR) 100 MG tablet Take 1 tablet (100 mg total) by mouth daily. 11/06/21  Yes Plotnikov, Evie Lacks, MD  metoprolol succinate (TOPROL-XL) 25 MG 24 hr tablet Take 1 tablet (25 mg total) by mouth daily. 06/23/22  Yes Plotnikov, Evie Lacks, MD  tobramycin-dexamethasone [facility]) ophthalmic  solution Place 1 drop into both eyes at bedtime. 10/06/19  Yes [provider]  traMADol (ULTRAM) 50 MG tablet TAKE 1-2 TABLETS TWICE A DAY AS NEEDED FOR SEVERE PAIN. Patient taking differently: Take 50 mg by mouth 2 (two) times daily. 06/23/22  Yes Plotnikov, Evie Lacks, MD  triamcinolone cream (KENALOG) 0.5 % Apply 1 Application topically 3 (three) times daily. Patient taking differently: Apply 1 Application topically daily as needed (rash). 06/23/22 06/23/23 Yes Plotnikov, Evie Lacks, MD  warfarin (COUMADIN) 5 MG tablet TAKE 1 TABLET BY MOUTH DAILY EXCEPT TAKE 1 1/2 TABLETS ON SUNDAYS AND THURSDAYS OR AS DIRECTED BY ANTICOAGULATION CLINIC Patient taking differently: Take 7.5 mg by mouth daily. TAKE 1 TABLET BY MOUTH DAILY EXCEPT TAKE 1 1/2 TABLETS ON SUNDAYS AND THURSDAYS OR AS DIRECTED BY ANTICOAGULATION CLINIC 02/25/22  Yes Plotnikov, Evie Lacks, MD  zolpidem (AMBIEN) 10 MG tablet 1 & 1/2 TABLETS ('15MG'$ ) BY MOUTH AT BEDTIME AS NEEDED FOR SLEEP, OK TO REPEAT 1&1/2 IN 4HRS Patient taking differently: Take 15 mg by mouth at bedtime. 06/23/22  Yes Plotnikov, Evie Lacks, MD  diphenoxylate-atropine (LOMOTIL) 2.5-0.025 MG tablet Take 1 tablet by mouth 4 (four) times daily. Patient not taking: Reported on 08/08/2022 06/23/22   Plotnikov, Evie Lacks, MD  EPINEPHrine 0.3 mg/0.3 mL IJ SOAJ injection USE AS DIRECTED Patient taking differently: Inject 0.3 mg into the muscle as needed for anaphylaxis. 10/12/18   Plotnikov, Evie Lacks, MD  ondansetron (ZOFRAN) 4 MG tablet Take 1 tablet (4 mg total) by mouth every 8 (eight) hours as needed for nausea or vomiting. Patient not taking: Reported on 08/09/2022 05/28/21   Plotnikov, Evie Lacks, MD  valACYclovir (VALTREX) 500 MG tablet Take 1 tablet (500 mg total) by mouth 2 (two) times daily. Patient not taking: Reported on 08/11/2022 05/21/21   Plotnikov, Evie Lacks, MD    Inpatient Medications: Scheduled Meds:  aspirin EC  81 mg Oral Daily   famotidine  20 mg Oral Daily    lacosamide  50 mg Oral BID   metoprolol succinate  25 mg Oral Daily   rosuvastatin  20 mg Oral Daily   Continuous Infusions:  heparin  PRN Meds: acetaminophen, ondansetron (ZOFRAN) IV, traMADol, zolpidem  Allergies:    Allergies  Allergen Reactions   Bee Venom Anaphylaxis and Other (See Comments)    Yellow jackets, wasps as well   Honey Bee Venom Anaphylaxis   Iohexol Anaphylaxis   Ivp Dye [Iodinated Contrast Media] Anaphylaxis   Meperidine Hcl Anaphylaxis   Shellfish Allergy Anaphylaxis   Temazepam Other (See Comments)    Severe Hallucinations and Paranoia   Amlodipine Besylate Swelling   Aspirin Other (See Comments)    REACTION: break out in sweats   Atorvastatin Diarrhea   Cholestyramine Other (See Comments)    REACTION: mouth irritation   Codeine Phosphate Nausea And Vomiting    Can take Tramadol ok   Demerol [Meperidine] Other (See Comments)    Severe Hallucination!!!!   Diphenhydramine Hcl Other (See Comments)    hyperactive   Doxycycline Nausea Only   Erythromycin Base Other (See Comments)   Gentamicin Sulfate Other (See Comments)    REACTION: Shortness of breath    Keppra [Levetiracetam]     Made her too groggy/ sleepy   Meclizine Other (See Comments)   Meclizine Hcl Other (See Comments)    REACTION: more dizziness on it   Metronidazole Other (See Comments)    REACTION: bad taste   Moxifloxacin Other (See Comments)    REACTION: insomnia Able to use Cipro   Niacin Other (See Comments)   Sulfamethoxazole Other (See Comments)    Kidney problems   Amoxicillin Rash   Iodine Rash   Penicillins Other (See Comments), Rash and Swelling    Has patient had a PCN reaction causing immediate rash, facial/tongue/throat swelling, SOB or lightheadedness with hypotension: Yes  Has patient had a PCN reaction causing severe rash involving mucus membranes or skin necrosis: No  Has patient had a PCN reaction that required hospitalization: No  Has patient had a PCN  reaction occurring within the last 10 years: No  If all of the above answers are "NO", then may proceed with Cephalosporin use.   Sulfa Antibiotics Rash and Other (See Comments)    Social History:   Social History   Socioeconomic History   Marital status: Married    Spouse name: Not on file   Number of children: 2   Years of education: Not on file   Highest education level: Not on file  Occupational History   Occupation: Retired    Fish farm manager: RETIRED  Tobacco Use   Smoking status: Never   Smokeless tobacco: Never  Vaping Use   Vaping Use: Never used  Substance and Sexual Activity   Alcohol use: No    Alcohol/week: 0.0 standard drinks of alcohol   Drug use: No   Sexual activity: Yes  Other Topics Concern   Not on file  Social History Narrative   Lives home with husband.  Education- graduate level.  Children 2.     Social Determinants of Health   Financial Resource Strain: Low Risk  (11/04/2021)   Overall Financial Resource Strain (CARDIA)    Difficulty of Paying Living Expenses: Not hard at all  Food Insecurity: No Food Insecurity (08/06/2022)   Hunger Vital Sign    Worried About Running Out of Food in the Last Year: Never true    Ran Out of Food in the Last Year: Never true  Transportation Needs: No Transportation Needs (08/04/2022)   PRAPARE - Hydrologist (Medical): No    Lack of Transportation (Non-Medical): No  Physical Activity:  Insufficiently Active (11/04/2021)   Exercise Vital Sign    Days of Exercise per Week: 5 days    Minutes of Exercise per Session: 10 min  Stress: No Stress Concern Present (11/04/2021)   [place]    Feeling of Stress : Not at all  Social Connections: Moderately Integrated (11/04/2021)   Social Connection and Isolation Panel [NHANES]    Frequency of Communication with Friends and Family: Three times a week    Frequency of Social Gatherings with  Friends and Family: Three times a week    Attends Religious Services: 1 to 4 times per year    Active Member of Clubs or Organizations: No    Attends Archivist Meetings: Never    Marital Status: Married  Human resources officer Violence: Not At Risk (08/02/2022)   Humiliation, Afraid, Rape, and Kick questionnaire    Fear of Current or Ex-Partner: No    Emotionally Abused: No    Physically Abused: No    Sexually Abused: No    Family History:    Family History  Problem Relation Age of Onset   Breast cancer Sister    Kidney disease Sister 23       nephritis   Parkinsonism Mother    Heart disease Brother    Osteoarthritis Other    Ulcerative colitis Sister    Kidney disease Sister    Breast cancer Sister    Colon cancer Neg Hx     ROS:  Please see the history of present illness.   All other ROS reviewed and negative.     Physical Exam/Data:   Vitals:   08/11/2022 0321 07/31/2022 0700 07/25/2022 0802 07/22/2022 0823  BP: 114/68 120/74 119/64 119/64  Pulse:  77 82 78  Resp: '16  16 15  '$ Temp: (!) 97.3 F (36.3 C)  97.9 F (36.6 C) 97.6 F (36.4 C)  TempSrc: Axillary  Oral Oral  SpO2:  100% 100% 100%  Weight:      Height:        Intake/Output Summary (Last 24 hours) at 07/29/2022 0941 Last data filed at 08/11/2022 2300 Gross per 24 hour  Intake --  Output 400 ml  Net -400 ml      07/27/2022    3:19 AM 07/28/2022    2:32 PM 06/23/2022    3:21 PM  Last 3 Weights  Weight (lbs) 115 lb 1.3 oz 120 lb 123 lb  Weight (kg) 52.2 kg 54.432 kg 55.792 kg     Body mass index is 19.75 kg/m.   General: Elderly, petite, well nourished, NAD Neck: Negative for carotid bruits. No JVD Lungs:Clear to ausculation bilaterally. No wheezes, rales, or rhonchi. Breathing is unlabored. Cardiovascular: RRR with S1 S2. + harsh systolic murmur Abdomen: Soft, non-tender, non-distended. No obvious abdominal masses. Extremities: Trace edema. Neuro: Alert and oriented. No focal deficits. No facial  asymmetry. MAE spontaneously. Psych: Responds to questions appropriately with normal affect.    EKG:  The EKG was personally reviewed and demonstrates:  08/09/2022 NSR  Telemetry:  Telemetry was personally reviewed and demonstrates:  07/19/2022 NSR with LVH, HR 78bpm.   Relevant CV Studies:  Echocardiogram 08/11/22:   1. Left ventricular ejection fraction, by estimation, is 55 to 60%. The  left ventricle has normal function. The left ventricle has no regional  wall motion abnormalities. Left ventricular diastolic function could not  be evaluated. There is severe  hypokinesis of the left ventricular, basal-mid inferoseptal wall  and  inferior wall.   2. Right ventricular systolic function is normal. The right ventricular  size is normal. There is normal pulmonary artery systolic pressure. The  estimated right ventricular systolic pressure is Q000111Q mmHg.   3. The mitral valve is degenerative. Mild mitral valve regurgitation. No  evidence of mitral stenosis. Moderate mitral annular calcification.   4. The aortic valve is calcified. There is severe calcifcation of the  aortic valve. There is severe thickening of the aortic valve. Aortic valve  regurgitation is not visualized. Severe aortic valve stenosis. Aortic  valve area, by VTI measures 0.46 cm.  Aortic valve mean gradient measures 18.0 mmHg. Aortic valve Vmax measures  3.02 m/s. Although the mean AVG is mild Vmax is not in the severe range,  the SVI is low at 15 and DVI 0.18. These findings are consistent with  paradoxical low flow low gradient  severe AS.   5. The inferior vena cava is normal in size with greater than 50%  respiratory variability, suggesting right atrial pressure of 3 mmHg.   Carotid doppler 01/07/2018:  Final Interpretation:  Right Carotid: Velocities in the right ICA are consistent with a 1-39%  stenosis.   Left Carotid: Velocities in the left ICA are consistent with a 1-39%  stenosis.   Vertebrals: Bilateral  vertebral arteries demonstrate antegrade flow.  Subclavians: Normal flow hemodynamics were seen in bilateral subclavian               arteries.   Laboratory Data:  High Sensitivity Troponin:   Recent Labs  Lab 07/20/2022 0900 08/02/2022 1056 08/06/2022 1616 08/05/2022 1743  TROPONINIHS 199* 189* 241* 250*     Chemistry Recent Labs  Lab 07/28/2022 0900 08/11/22 0541 07/22/2022 0213  NA 135 135 136  K 3.9 3.6 3.6  CL 101 100 99  CO2 21* 24 26  GLUCOSE 94 100* 107*  BUN '15 15 20  '$ CREATININE 0.99 1.08* 1.23*  CALCIUM 9.4 8.5* 8.7*  GFRNONAA 56* 50* 43*  ANIONGAP '13 11 11    '$ Recent Labs  Lab 07/25/2022 0213  PROT 6.0*  ALBUMIN 3.0*  AST 18  ALT 11  ALKPHOS 39  BILITOT 0.5   Hematology Recent Labs  Lab 08/11/2022 0900 08/11/22 0541 08/06/2022 0213  WBC 7.1 7.3 8.2  RBC 3.73* 3.28* 3.26*  HGB 12.3 11.0* 10.9*  HCT 37.1 32.5* 32.7*  MCV 99.5 99.1 100.3*  MCH 33.0 33.5 33.4  MCHC 33.2 33.8 33.3  RDW 13.7 13.5 13.2  PLT 240 257 264   BNP Recent Labs  Lab 07/17/2022 0900  BNP 1,585.6*    DDimer  Recent Labs  Lab 08/09/2022 0900  DDIMER 3.00*     Radiology/Studies:  ECHOCARDIOGRAM COMPLETE  Result Date: 08/11/2022    ECHOCARDIOGRAM REPORT   Patient Name:   Tracy Peters [facility] Date of Exam: 08/11/2022 Medical Rec #:  [id number]              Height:       64.0 in Accession #:    [id number]             Weight:       120.0 lb Date of Birth:  21-Oct-1933               BSA:          1.575 m Patient Age:    46 years               BP:  118/68 mmHg Patient Gender: F                      HR:           97 bpm. Exam Location:  Inpatient Procedure: 2D Echo, Cardiac Doppler and Color Doppler Indications:    [id number] Acute diastolic (congestive) heart failure  History:        Patient has no prior history of Echocardiogram examinations.                 CAD, Signs/Symptoms:Shortness of Breath; Risk                 Factors:Non-Smoker, Hypertension and Dyslipidemia.  Sonographer:    Wilkie Aye RVT RCS Referring Phys: [place]  Sonographer Comments: Suboptimal parasternal window. IMPRESSIONS  1. Left ventricular ejection fraction, by estimation, is 55 to 60%. The left ventricle has normal function. The left ventricle has no regional wall motion abnormalities. Left ventricular diastolic function could not be evaluated. There is severe hypokinesis of the left ventricular, basal-mid inferoseptal wall and inferior wall.  2. Right ventricular systolic function is normal. The right ventricular size is normal. There is normal pulmonary artery systolic pressure. The estimated right ventricular systolic pressure is Q000111Q mmHg.  3. The mitral valve is degenerative. Mild mitral valve regurgitation. No evidence of mitral stenosis. Moderate mitral annular calcification.  4. The aortic valve is calcified. There is severe calcifcation of the aortic valve. There is severe thickening of the aortic valve. Aortic valve regurgitation is not visualized. Severe aortic valve stenosis. Aortic valve area, by VTI measures 0.46 cm. Aortic valve mean gradient measures 18.0 mmHg. Aortic valve Vmax measures 3.02 m/s. Although the mean AVG is mild Vmax is not in the severe range, the SVI is low at 15 and DVI 0.18. These findings are consistent with paradoxical low flow low gradient severe AS.  5. The inferior vena cava is normal in size with greater than 50% respiratory variability, suggesting right atrial pressure of 3 mmHg. FINDINGS  Left Ventricle: Left ventricular ejection fraction, by estimation, is 55 to 60%. The left ventricle has normal function. The left ventricle has no regional wall motion abnormalities. Severe hypokinesis of the left ventricular, basal-mid inferoseptal wall and inferior wall. The left ventricular internal cavity size was normal in size. There is no left ventricular hypertrophy. Left ventricular diastolic function could not be evaluated. Right Ventricle: The right ventricular size is  normal. No increase in right ventricular wall thickness. Right ventricular systolic function is normal. There is normal pulmonary artery systolic pressure. The tricuspid regurgitant velocity is 2.31 m/s, and  with an assumed right atrial pressure of 3 mmHg, the estimated right ventricular systolic pressure is Q000111Q mmHg. Left Atrium: Left atrial size was normal in size. Right Atrium: Right atrial size was normal in size. Pericardium: There is no evidence of pericardial effusion. Mitral Valve: The mitral valve is degenerative in appearance. There is moderate thickening of the mitral valve leaflet(s). There is moderate calcification of the mitral valve leaflet(s). Moderate mitral annular calcification. Mild mitral valve regurgitation. No evidence of mitral valve stenosis. MV peak gradient, 5.5 mmHg. The mean mitral valve gradient is 2.0 mmHg. Tricuspid Valve: The tricuspid valve is normal in structure. Tricuspid valve regurgitation is mild . No evidence of tricuspid stenosis. Aortic Valve: The aortic valve is calcified. There is severe calcifcation of the aortic valve. There is severe thickening of the aortic valve. Aortic valve regurgitation is  not visualized. Severe aortic stenosis is present. Aortic valve mean gradient measures 18.0 mmHg. Aortic valve peak gradient measures 36.5 mmHg. Aortic valve area, by VTI measures 0.46 cm. Pulmonic Valve: The pulmonic valve was normal in structure. Pulmonic valve regurgitation is trivial. No evidence of pulmonic stenosis. Aorta: The aortic root is normal in size and structure. Venous: The inferior vena cava is normal in size with greater than 50% respiratory variability, suggesting right atrial pressure of 3 mmHg. IAS/Shunts: No atrial level shunt detected by color flow Doppler.  LEFT VENTRICLE PLAX 2D LVIDd:         4.00 cm LVIDs:         2.80 cm LV PW:         0.90 cm LV IVS:        1.00 cm LVOT diam:     1.80 cm LV SV:         23 LV SV Index:   15 LVOT Area:     2.54 cm   RIGHT VENTRICLE             IVC RV Basal diam:  2.80 cm     IVC diam: 1.70 cm RV S prime:     15.70 cm/s TAPSE (M-mode): 2.5 cm LEFT ATRIUM             Index        RIGHT ATRIUM          Index LA diam:        3.80 cm 2.41 cm/m   RA Area:     6.82 cm LA Vol (A2C):   30.3 ml 19.24 ml/m  RA Volume:   14.40 ml 9.15 ml/m LA Vol (A4C):   46.3 ml 29.40 ml/m LA Biplane Vol: 38.6 ml 24.51 ml/m  AORTIC VALVE                     PULMONIC VALVE AV Area (Vmax):    0.53 cm      PV Vmax:          1.01 m/s AV Area (Vmean):   0.53 cm      PV Peak grad:     4.1 mmHg AV Area (VTI):     0.46 cm      PR End Diast Vel: 6.55 msec AV Vmax:           302.00 cm/s AV Vmean:          193.000 cm/s AV VTI:            0.501 m AV Peak Grad:      36.5 mmHg AV Mean Grad:      18.0 mmHg LVOT Vmax:         62.50 cm/s LVOT Vmean:        39.900 cm/s LVOT VTI:          0.090 m LVOT/AV VTI ratio: 0.18  AORTA Ao Root diam: 2.50 cm Ao Asc diam:  3.30 cm Ao Arch diam: 2.3 cm MITRAL VALVE             TRICUSPID VALVE MV Area VTI:  1.23 cm   TR Peak grad:   21.3 mmHg MV Peak grad: 5.5 mmHg   TR Vmax:        231.00 cm/s MV Mean grad: 2.0 mmHg MV Vmax:      1.17 m/s   SHUNTS MV Vmean:     70.7 cm/s  Systemic VTI:  0.09 m  Systemic Diam: 1.80 cm Fransico Him MD Electronically signed by Fransico Him MD Signature Date/Time: 08/11/2022/9:11:31 AM    Final    NM Pulmonary Perfusion  Result Date: 08/07/2022 CLINICAL DATA:  Pulmonary embolism (PE) suspected, high prob Shortness of breath.  Chest pain. EXAM: NUCLEAR MEDICINE PERFUSION LUNG SCAN TECHNIQUE: Perfusion images were obtained in multiple projections after intravenous injection of radiopharmaceutical. Ventilation scans intentionally deferred if perfusion scan and chest x-ray adequate for interpretation during COVID 19 epidemic. RADIOPHARMACEUTICALS:  4 mCi Tc-55mMAA IV COMPARISON:  Chest radiograph earlier today. FINDINGS: Homogeneous perfusion throughout both lungs. No  peripheral or wedge-shaped defects to suggest pulmonary embolus. IMPRESSION: No scintigraphic evidence of pulmonary embolus. Electronically Signed   By: MKeith RakeM.D.   On: 07/27/2022 17:19   DG Knee Complete 4 Views Right  Result Date: 08/02/2022 CLINICAL DATA:  RIGHT knee pain EXAM: RIGHT KNEE - COMPLETE 4+ VIEW COMPARISON:  None Available. FINDINGS: Osseous alignment is within normal limits. No fracture line or displaced fracture fragment. No acute-appearing cortical irregularity or osseous lesion. Tricompartmental degenerative osteoarthritic changes, mild to moderate in degree with joint space narrowing and mild osseous spurring. Faint calcifications within the medial and lateral compartments, compatible with associated chondrocalcinosis. Probable small joint effusion within the suprapatellar bursa. Extensive atherosclerosis of the superficial femoral artery. Superficial soft tissues about the RIGHT knee are otherwise unremarkable. IMPRESSION: 1. No acute-appearing osseous abnormality. 2. Probable small joint effusion. 3. Tricompartmental degenerative osteoarthritic changes, mild to moderate in degree. 4. Chondrocalcinosis within the medial and lateral compartments, compatible with underlying CPPD. 5. Extensive atherosclerosis of the superficial femoral artery. Electronically Signed   By: SFranki CabotM.D.   On: 07/31/2022 10:41   DG Foot Complete Right  Result Date: 07/19/2022 CLINICAL DATA:  RIGHT foot pain EXAM: RIGHT FOOT COMPLETE - 3+ VIEW COMPARISON:  None Available. FINDINGS: No acute-appearing osseous abnormality. No fracture line or displaced fracture fragment is seen, although osteopenia limits characterization of osseous detail. Soft tissues about the RIGHT w foot are unremarkable. Degenerative osteoarthritic changes at the first MTP joint, at least moderate in degree with joint space loss and articular surface sclerosis. Milder degenerative changes are seen at the TMT,  interphalangeal joint spaces and midfoot. IMPRESSION: 1. No acute findings. 2. Degenerative osteoarthritic changes, as detailed above. 3. Osteopenia. Electronically Signed   By: SFranki CabotM.D.   On: 08/05/2022 10:39   DG Chest Port 1 View  Result Date: 08/03/2022 CLINICAL DATA:  Chest pain and wheezing since yesterday. EXAM: PORTABLE CHEST 1 VIEW COMPARISON:  Chest x-rays dated 05/17/2021 and 02/05/2020 FINDINGS: Coarse lung markings are seen bilaterally. Increased interstitial markings are seen throughout both lungs. More confluent opacity at the LEFT lung base. Probable small LEFT pleural effusion. No pneumothorax is seen. Heart size and mediastinal contours are stable. IMPRESSION: 1. Increased interstitial markings throughout both lungs, most likely pulmonary edema and/or atypical pneumonia superimposed on chronic interstitial lung disease, alternatively a rapidly worsening chronic interstitial lung disease. 2. More confluent opacity at the LEFT lung base, atelectasis versus pneumonia. 3. Probable small LEFT pleural effusion. Electronically Signed   By: SFranki CabotM.D.   On: 07/29/2022 10:36    STS Risk Calculator: Procedure: AV Replacement  Procedure Type: Isolated AVR PERIOPERATIVE OUTCOME ESTIMATE % Operative Mortality 5.12% Morbidity & Mortality 11.8% Stroke 2.7% Renal Failure 2.64% Reoperation 3.75% Prolonged Ventilation 6.35% Deep Sternal Wound Infection 0.033% [facility] 6.85% Short Hospital Stay (<6 days)* 27.5%  Assessment and Plan:  Tracy Peters is a 89 y.o. female with symptoms of severe, stage D3 aortic stenosis with NYHA Class III/IV symptoms. I have reviewed the patient's recent echocardiogram which is notable for normal LV systolic function and paradoxical low flow low gradient severe aortic stenosis with peak gradient of 36.2mHg and mean transvalvular gradient of 112mg. The patient's dimensionless index is 0.18 and calculated aortic  valve area is 0.46 cm.    I have reviewed the natural history of aortic stenosis with the patient. We have discussed the limitations of medical therapy and the poor prognosis associated with symptomatic aortic stenosis. We have reviewed potential treatment options, including palliative medical therapy, conventional surgical aortic valve replacement, and transcatheter aortic valve replacement. We discussed treatment options in the context of this patient's specific comorbid medical conditions.    The patient's predicted risk of mortality with conventional aortic valve replacement is 5.12% primarily based on recent NSTEMI with unknown coronary anatomy, acute diastolic HF, moderate AKI, and advanced age.   Other significant comorbid conditions include hx of DVT on chronic coumadin. TAVR seems like a reasonable treatment option for this patient pending formal cardiac surgical consultation. We discussed typical evaluation which will require a R/LHC which is planned for later today if INR improved. She will also require a gated cardiac CTA and a CTA of the chest/abdomen/pelvis to evaluate both her cardiac anatomy and peripheral vasculature. S  For questions or updates, please contact [facility] consult [email or web address] for contact info under    Signed, JiKathyrn DrownNP  08/11/2022 9:41 AM   I have personally seen and examined this patient. I agree with the assessment and plan as outlined above. [age]o female with history of HTN, HLD, prior DVT with digital thrombus on long term coumadin therapy, new finding of severe paradoxical low flow/low gradient aortic stenosis that we are asked to see today to discuss her aortic stenosis and possible TAVR. She has not been followed by cardiology prior to this admission. She was admitted on 08/01/2022 with chest pain and dyspnea. She was felt to be volume overloaded and was diuresed with IV Lasix with improvement in her dyspnea. Mild troponin elevation with  flat trend (peak HsTrop 250). Echo with normal LV function. Severe paradoxical low flow/low gradient AS (mean gradient 18 mmHg, AVA 0.46 cm2, DI 0.18, SVI 15). Mild MR. She is currently awaiting a right and left heart cath. INR 2.0 earlier this am.   I have personally reviewed the echo images, EKG and all available labs.   She has no complaints today.   My exam:  General: Well developed, well nourished, NAD  HEENT: OP clear, mucus membranes moist  SKIN: warm, dry. No rashes. Neuro: No focal deficits  Musculoskeletal: Muscle strength 5/5 all ext  Psychiatric: Mood and affect normal  Neck: No JVD, no carotid bruits, no thyromegaly, no lymphadenopathy.  Lungs:Clear bilaterally, no wheezes, rhonci, crackles Cardiovascular: Regular rate and rhythm. Harsh systolic murmur.  Abdomen:Soft. Bowel sounds present. Non-tender.  Extremities: No lower extremity edema. Pulses are 2 + in the bilateral DP/PT.  Plan:  1. Severe Aortic Valve Stenosis: She has severe, stage D2 aortic valve stenosis (low flow/low gradient). I have personally reviewed the echo images. NYHA class 3 symptoms. The aortic valve is thickened, calcified with limited leaflet mobility. I think she would benefit from AVR. Given advanced age, she is not a good candidate for conventional AVR by surgical approach. I think she may be a good candidate for TAVR.  I have reviewed the natural history of aortic stenosis with the patient and their family members  who are present today. We have discussed the limitations of medical therapy and the poor prognosis associated with symptomatic aortic stenosis. We have reviewed potential treatment options, including palliative medical therapy, conventional surgical aortic valve replacement, and transcatheter aortic valve replacement. We discussed treatment options in the context of the patient's specific comorbid medical conditions.   She would like to proceed with planning for TAVR. Currently awaiting  right and left heart catheterization pending INR level. Risks and benefits of the cath procedure and the valve procedure are reviewed with the patient. After the cath, she will have a cardiac CT, CTA of the chest/abdomen and pelvis. We will hopefully be able to perform this before discharge home pending her renal function. She will then be referred to see one of the CT surgeons on our TAVR team.   Lauree Chandler, MD, [facility] 07/22/2022 10:11 AM

## 2022-08-11 NOTE — Care Management (Signed)
  Transition of Care [facility]) Screening Note   Patient Details  Name: Zahria Sieg Date of Birth: 1933-12-08   Transition of Care [facility]) CM/SW Contact:    Bethena Roys, RN Phone Number: 08/11/2022, 3:39 PM    [facility] has reviewed the patient and no TOC needs have been identified at this time. Patient presented for chest pain and wheezing. We will continue to monitor patient advancement through interdisciplinary progression rounds. If new patient transition needs arise, please place a TOC consult.

## 2022-08-11 NOTE — Consult Note (Incomplete)
[place]  Cardiology Consultation:   Patient ID: Tracy Peters MRN: [id number]; DOB: [birth year]  Admit date: 08/09/2022 Date of Consult: 08/06/2022  Primary Care Provider: Cassandria Anger, MD [facility] Cardiologist: Werner Lean, MD (new)  Patient Profile:   Tracy Peters is a 89 y.o. female with a hx of HTN, HLD, PVD, hx of DVT/digitalis thrombus now on indefinite Coumadin therapy and mild carotid disease who presented to [facility] with chest pain, SOB, and NSTEMI found to have severe aortic stenosis and is being seen today for the evaluation of possible TAVR at the request of Dr. Johney Frame.  History of Present Illness:   Ms. Dechene lives in [place] with her husband of many years. They have three adult children, one of which who lives close by. She is active in her home and completes daily chores without issues until the last several weeks. She ambulates independently without assistive devices.  She follows regularly with her PCP and has never seen has never followed with cardiology. She states that she was told that she had a murmur in the past however has never undergone workup for this. Ms. Gulbranson reports that she was in her usual state of health until approximately 2 weeks ago when she began having SOB with her everyday activities. This has been progressive since that time. On Saturday, she reported an episode of non-exertional chest pain. She went to bed and woke the next morning with a "wet cough". By Sunday, she had another episode of chest pain with worsening SOB. Given this, her husband brought her to the ED for further evaluation.   HsT found to be elevated at 199>>189>>241 felt to be demand ischemia secondary to ACHF. CXR showed increased interstitial pulmonary edema with confluent opacity at the left lung base and small left pleural effusion. BNP at 1585. Echocardiogram showed normal LV function  with severe hypokinesis of the left ventricular, basal-mid inferoseptal wall and  inferior wall, mod MR with MAC, and severe aortic valve stenosis with an AVA bt VTI at   0.46 cm, mean gradient at 18.0 mmHg however SVI is 15 and DVI 0.18, all consistent with paradoxical low flow low gradient severe AS. She has been treated with IV Lasix with good diuresis. Plan is for [facility] however INR remains slightly elevated today at 2.0. Plan to obtain INR later today in hopes for cardiac catheterization later today.   On my exam she is resting comfortably. She denies chest pain although had an episode yesterday evening. She denies SOB, palpitations. She does report intermittent dizziness over the last several weeks as well. Episodes will come while sitting and do not seem orthostatic in nature. Likely due to her AS. She has chronic LE edema that was previously controlled with PO Lasix although this is not currently on her medication list. She follows regularly with a dentist and has no active dental issues.   Past Medical History:  Diagnosis Date   Adenomatous colon polyp    Allergic rhinitis    uses FLonase nightly   Anemia    Arthritis    joint pain   Blood transfusion    Bronchitis    hx of-7-66yr ago   Carpal tunnel syndrome    numbness and tingling    Cataracts, bilateral    Chronic anticoagulation 03/21/2013   Congenital absence of one kidney    pt only has one kidney   Diarrhea    takes Immodium daily  as needed or Lomotil    Diverticulosis    GERD (gastroesophageal reflux disease)    mild   Herpes    takes Valtrex daily   History of colon polyps    History of DVT (deep vein thrombosis) 1998   right arm   History of staph infection    Hyperlipidemia    Medical MD doesn't think its absolutely necessary   Hypertension    takes Losartan daily   IBS (irritable bowel syndrome)    takes Align daily   Insomnia    takes Ambien nightly as needed   LBP (low back pain)    Long term (current)  use of anticoagulants    Dr. 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    Urinary leakage    UTI (urinary tract infection)     Past Surgical History:  Procedure Laterality Date    kidney disease left      ABDOMINAL HYSTERECTOMY     ANTERIOR CERVICAL DECOMP/DISCECTOMY FUSION N/A 01/18/2014   Procedure: ANTERIOR CERVICAL DECOMPRESSION/DISCECTOMY FUSION 3 LEVELS  Cervical  four/five, five/six, six/seven anterior cervical decompression with fusion interbody prosthesis with plating and bonegraft;  Surgeon: Newman Pies, MD;  Location: [facility];  Service: Neurosurgery;  Laterality: N/A;  t   APPENDECTOMY     BACK SURGERY     2   BREAST BIOPSY Left 30+ yrs ago   benign   BREAST BIOPSY Left 07/09/2018   BREAST LUMPECTOMY Left 08/06/2018   BREAST LUMPECTOMY WITH RADIOACTIVE SEED LOCALIZATION Left 08/06/2018   Procedure: LEFT BREAST LUMPECTOMY WITH RADIOACTIVE SEED LOCALIZATION;  Surgeon: Fanny Skates, MD;  Location: [facility];  Service: General;  Laterality: Left;   BREAST SURGERY     cystectomy-benign   CARPAL TUNNEL RELEASE Right 06/12/2014   Procedure: CARPAL TUNNEL RELEASE;  Surgeon: Newman Pies, MD;  Location: [facility];  Service: Neurosurgery;  Laterality: Right;  Right Carpal Tunnel Release   CATARACT EXTRACTION Bilateral 02/15/2015   CHOLECYSTECTOMY     COLONOSCOPY     ECTOPIC PREGNANCY SURGERY  1959   HIP CLOSED REDUCTION Right 08/23/2017   Procedure: CLOSED MANIPULATION HIP;  Surgeon: Nicholes Stairs, MD;  Location: [facility];  Service: Orthopedics;  Laterality: Right;   I&D of abdomen  1988   couple of wks after gallbladder removed   KNEE SURGERY     left x 3    LUMBAR LAMINECTOMY     X 2   mortons neuromas removed     OPEN SURGICAL REPAIR OF GLUTEAL TENDON Right 06/15/2017   Procedure: OPEN SURGICAL REPAIR OF GLUTEALmedius TENDON;  Surgeon: Paralee Cancel, MD;  Location: [facility];  Service: Orthopedics;  Laterality: Right;   SHOULDER SURGERY     Right   TOTAL HIP ARTHROPLASTY Right 06/15/2017   Procedure: Right total hip arthroplasty, bursectomy, repair of gluteal tendon, posterior approach;  Surgeon: Paralee Cancel, MD;  Location: [facility];  Service: Orthopedics;  Laterality: Right;  90 mins for all procedures together     Home Medications:  Prior to Admission medications   Medication Sig Start Date End Date Taking? Authorizing Provider  amLODipine (NORVASC)  2.5 MG tablet Take 1 tablet (2.5 mg total) by mouth daily. 06/23/22  Yes Plotnikov, Evie Lacks, MD  Cholecalciferol (VITAMIN D3) 1.25 MG (50000 UT) CAPS Take 1 capsule every 14 days 05/21/21  Yes Plotnikov, Evie Lacks, MD  cloNIDine (CATAPRES) 0.1 MG tablet Take 1 tablet (0.1 mg total) by mouth 2 (two) times daily. 09/17/20  Yes Plotnikov, Evie Lacks, MD  famotidine (PEPCID) 20 MG tablet Take 1 tablet (20 mg total) by mouth 2 (two) times daily. 05/21/21  Yes Plotnikov, Evie Lacks, MD  ferrous sulfate 325 (65 FE) MG tablet Take 1 tablet (325 mg total) by mouth every other day. 06/15/19  Yes Plotnikov, Evie Lacks, MD  fluticasone (FLONASE) 50 MCG/ACT nasal spray Place 2 sprays into both nostrils daily. 06/23/22  Yes Plotnikov, Evie Lacks, MD  lacosamide (VIMPAT) 50 MG TABS tablet Take 1 tablet (50 mg total) by mouth 2 (two) times daily. Overdue for follow-up appt must see MD for refills 06/23/22  Yes Plotnikov, Evie Lacks, MD  losartan (COZAAR) 100 MG tablet Take 1 tablet (100 mg total) by mouth daily. 11/06/21  Yes Plotnikov, Evie Lacks, MD  metoprolol succinate (TOPROL-XL) 25 MG 24 hr tablet Take 1 tablet (25 mg total) by mouth daily. 06/23/22  Yes Plotnikov, Evie Lacks, MD  tobramycin-dexamethasone [facility]) ophthalmic  solution Place 1 drop into both eyes at bedtime. 10/06/19  Yes [provider]  traMADol (ULTRAM) 50 MG tablet TAKE 1-2 TABLETS TWICE A DAY AS NEEDED FOR SEVERE PAIN. Patient taking differently: Take 50 mg by mouth 2 (two) times daily. 06/23/22  Yes Plotnikov, Evie Lacks, MD  triamcinolone cream (KENALOG) 0.5 % Apply 1 Application topically 3 (three) times daily. Patient taking differently: Apply 1 Application topically daily as needed (rash). 06/23/22 06/23/23 Yes Plotnikov, Evie Lacks, MD  warfarin (COUMADIN) 5 MG tablet TAKE 1 TABLET BY MOUTH DAILY EXCEPT TAKE 1 1/2 TABLETS ON SUNDAYS AND THURSDAYS OR AS DIRECTED BY ANTICOAGULATION CLINIC Patient taking differently: Take 7.5 mg by mouth daily. TAKE 1 TABLET BY MOUTH DAILY EXCEPT TAKE 1 1/2 TABLETS ON SUNDAYS AND THURSDAYS OR AS DIRECTED BY ANTICOAGULATION CLINIC 02/25/22  Yes Plotnikov, Evie Lacks, MD  zolpidem (AMBIEN) 10 MG tablet 1 & 1/2 TABLETS ('15MG'$ ) BY MOUTH AT BEDTIME AS NEEDED FOR SLEEP, OK TO REPEAT 1&1/2 IN 4HRS Patient taking differently: Take 15 mg by mouth at bedtime. 06/23/22  Yes Plotnikov, Evie Lacks, MD  diphenoxylate-atropine (LOMOTIL) 2.5-0.025 MG tablet Take 1 tablet by mouth 4 (four) times daily. Patient not taking: Reported on 07/28/2022 06/23/22   Plotnikov, Evie Lacks, MD  EPINEPHrine 0.3 mg/0.3 mL IJ SOAJ injection USE AS DIRECTED Patient taking differently: Inject 0.3 mg into the muscle as needed for anaphylaxis. 10/12/18   Plotnikov, Evie Lacks, MD  ondansetron (ZOFRAN) 4 MG tablet Take 1 tablet (4 mg total) by mouth every 8 (eight) hours as needed for nausea or vomiting. Patient not taking: Reported on 08/04/2022 05/28/21   Plotnikov, Evie Lacks, MD  valACYclovir (VALTREX) 500 MG tablet Take 1 tablet (500 mg total) by mouth 2 (two) times daily. Patient not taking: Reported on 08/02/2022 05/21/21   Plotnikov, Evie Lacks, MD    Inpatient Medications: Scheduled Meds:  aspirin EC  81 mg Oral Daily   famotidine  20 mg Oral Daily    lacosamide  50 mg Oral BID   metoprolol succinate  25 mg Oral Daily   rosuvastatin  20 mg Oral Daily   Continuous Infusions:  heparin  PRN Meds: acetaminophen, ondansetron (ZOFRAN) IV, traMADol, zolpidem  Allergies:    Allergies  Allergen Reactions   Bee Venom Anaphylaxis and Other (See Comments)    Yellow jackets, wasps as well   Honey Bee Venom Anaphylaxis   Iohexol Anaphylaxis   Ivp Dye [Iodinated Contrast Media] Anaphylaxis   Meperidine Hcl Anaphylaxis   Shellfish Allergy Anaphylaxis   Temazepam Other (See Comments)    Severe Hallucinations and Paranoia   Amlodipine Besylate Swelling   Aspirin Other (See Comments)    REACTION: break out in sweats   Atorvastatin Diarrhea   Cholestyramine Other (See Comments)    REACTION: mouth irritation   Codeine Phosphate Nausea And Vomiting    Can take Tramadol ok   Demerol [Meperidine] Other (See Comments)    Severe Hallucination!!!!   Diphenhydramine Hcl Other (See Comments)    hyperactive   Doxycycline Nausea Only   Erythromycin Base Other (See Comments)   Gentamicin Sulfate Other (See Comments)    REACTION: Shortness of breath    Keppra [Levetiracetam]     Made her too groggy/ sleepy   Meclizine Other (See Comments)   Meclizine Hcl Other (See Comments)    REACTION: more dizziness on it   Metronidazole Other (See Comments)    REACTION: bad taste   Moxifloxacin Other (See Comments)    REACTION: insomnia Able to use Cipro   Niacin Other (See Comments)   Sulfamethoxazole Other (See Comments)    Kidney problems   Amoxicillin Rash   Iodine Rash   Penicillins Other (See Comments), Rash and Swelling    Has patient had a PCN reaction causing immediate rash, facial/tongue/throat swelling, SOB or lightheadedness with hypotension: Yes  Has patient had a PCN reaction causing severe rash involving mucus membranes or skin necrosis: No  Has patient had a PCN reaction that required hospitalization: No  Has patient had a PCN  reaction occurring within the last 10 years: No  If all of the above answers are "NO", then may proceed with Cephalosporin use.   Sulfa Antibiotics Rash and Other (See Comments)    Social History:   Social History   Socioeconomic History   Marital status: Married    Spouse name: Not on file   Number of children: 2   Years of education: Not on file   Highest education level: Not on file  Occupational History   Occupation: Retired    Fish farm manager: RETIRED  Tobacco Use   Smoking status: Never   Smokeless tobacco: Never  Vaping Use   Vaping Use: Never used  Substance and Sexual Activity   Alcohol use: No    Alcohol/week: 0.0 standard drinks of alcohol   Drug use: No   Sexual activity: Yes  Other Topics Concern   Not on file  Social History Narrative   Lives home with husband.  Education- graduate level.  Children 2.     Social Determinants of Health   Financial Resource Strain: Low Risk  (11/04/2021)   Overall Financial Resource Strain (CARDIA)    Difficulty of Paying Living Expenses: Not hard at all  Food Insecurity: No Food Insecurity (08/06/2022)   Hunger Vital Sign    Worried About Running Out of Food in the Last Year: Never true    Ran Out of Food in the Last Year: Never true  Transportation Needs: No Transportation Needs (07/20/2022)   PRAPARE - Hydrologist (Medical): No    Lack of Transportation (Non-Medical): No  Physical Activity:  Insufficiently Active (11/04/2021)   Exercise Vital Sign    Days of Exercise per Week: 5 days    Minutes of Exercise per Session: 10 min  Stress: No Stress Concern Present (11/04/2021)   [place]    Feeling of Stress : Not at all  Social Connections: Moderately Integrated (11/04/2021)   Social Connection and Isolation Panel [NHANES]    Frequency of Communication with Friends and Family: Three times a week    Frequency of Social Gatherings with  Friends and Family: Three times a week    Attends Religious Services: 1 to 4 times per year    Active Member of Clubs or Organizations: No    Attends Archivist Meetings: Never    Marital Status: Married  Human resources officer Violence: Not At Risk (08/09/2022)   Humiliation, Afraid, Rape, and Kick questionnaire    Fear of Current or Ex-Partner: No    Emotionally Abused: No    Physically Abused: No    Sexually Abused: No    Family History:    Family History  Problem Relation Age of Onset   Breast cancer Sister    Kidney disease Sister 51       nephritis   Parkinsonism Mother    Heart disease Brother    Osteoarthritis Other    Ulcerative colitis Sister    Kidney disease Sister    Breast cancer Sister    Colon cancer Neg Hx     ROS:  Please see the history of present illness.   All other ROS reviewed and negative.     Physical Exam/Data:   Vitals:   08/01/2022 0321 08/08/2022 0700 08/01/2022 0802 08/04/2022 0823  BP: 114/68 120/74 119/64 119/64  Pulse:  77 82 78  Resp: '16  16 15  '$ Temp: (!) 97.3 F (36.3 C)  97.9 F (36.6 C) 97.6 F (36.4 C)  TempSrc: Axillary  Oral Oral  SpO2:  100% 100% 100%  Weight:      Height:        Intake/Output Summary (Last 24 hours) at 08/11/2022 0941 Last data filed at 08/11/2022 2300 Gross per 24 hour  Intake --  Output 400 ml  Net -400 ml      08/08/2022    3:19 AM 07/28/2022    2:32 PM 06/23/2022    3:21 PM  Last 3 Weights  Weight (lbs) 115 lb 1.3 oz 120 lb 123 lb  Weight (kg) 52.2 kg 54.432 kg 55.792 kg     Body mass index is 19.75 kg/m.   General: Elderly, petite, well nourished, NAD Neck: Negative for carotid bruits. No JVD Lungs:Clear to ausculation bilaterally. No wheezes, rales, or rhonchi. Breathing is unlabored. Cardiovascular: RRR with S1 S2. + harsh systolic murmur Abdomen: Soft, non-tender, non-distended. No obvious abdominal masses. Extremities: Trace edema. Neuro: Alert and oriented. No focal deficits. No facial  asymmetry. MAE spontaneously. Psych: Responds to questions appropriately with normal affect.    EKG:  The EKG was personally reviewed and demonstrates:  08/02/2022 NSR  Telemetry:  Telemetry was personally reviewed and demonstrates:  07/27/2022 NSR with LVH, HR 78bpm.   Relevant CV Studies:  Echocardiogram 08/11/22:   1. Left ventricular ejection fraction, by estimation, is 55 to 60%. The  left ventricle has normal function. The left ventricle has no regional  wall motion abnormalities. Left ventricular diastolic function could not  be evaluated. There is severe  hypokinesis of the left ventricular, basal-mid inferoseptal wall  and  inferior wall.   2. Right ventricular systolic function is normal. The right ventricular  size is normal. There is normal pulmonary artery systolic pressure. The  estimated right ventricular systolic pressure is Q000111Q mmHg.   3. The mitral valve is degenerative. Mild mitral valve regurgitation. No  evidence of mitral stenosis. Moderate mitral annular calcification.   4. The aortic valve is calcified. There is severe calcifcation of the  aortic valve. There is severe thickening of the aortic valve. Aortic valve  regurgitation is not visualized. Severe aortic valve stenosis. Aortic  valve area, by VTI measures 0.46 cm.  Aortic valve mean gradient measures 18.0 mmHg. Aortic valve Vmax measures  3.02 m/s. Although the mean AVG is mild Vmax is not in the severe range,  the SVI is low at 15 and DVI 0.18. These findings are consistent with  paradoxical low flow low gradient  severe AS.   5. The inferior vena cava is normal in size with greater than 50%  respiratory variability, suggesting right atrial pressure of 3 mmHg.   Carotid doppler 01/07/2018:  Final Interpretation:  Right Carotid: Velocities in the right ICA are consistent with a 1-39%  stenosis.   Left Carotid: Velocities in the left ICA are consistent with a 1-39%  stenosis.   Vertebrals: Bilateral  vertebral arteries demonstrate antegrade flow.  Subclavians: Normal flow hemodynamics were seen in bilateral subclavian               arteries.   Laboratory Data:  High Sensitivity Troponin:   Recent Labs  Lab 08/09/2022 0900 08/11/2022 1056 08/07/2022 1616 07/24/2022 1743  TROPONINIHS 199* 189* 241* 250*     Chemistry Recent Labs  Lab 08/11/2022 0900 08/11/22 0541 08/09/2022 0213  NA 135 135 136  K 3.9 3.6 3.6  CL 101 100 99  CO2 21* 24 26  GLUCOSE 94 100* 107*  BUN '15 15 20  '$ CREATININE 0.99 1.08* 1.23*  CALCIUM 9.4 8.5* 8.7*  GFRNONAA 56* 50* 43*  ANIONGAP '13 11 11    '$ Recent Labs  Lab 07/31/2022 0213  PROT 6.0*  ALBUMIN 3.0*  AST 18  ALT 11  ALKPHOS 39  BILITOT 0.5   Hematology Recent Labs  Lab 08/11/2022 0900 08/11/22 0541 08/03/2022 0213  WBC 7.1 7.3 8.2  RBC 3.73* 3.28* 3.26*  HGB 12.3 11.0* 10.9*  HCT 37.1 32.5* 32.7*  MCV 99.5 99.1 100.3*  MCH 33.0 33.5 33.4  MCHC 33.2 33.8 33.3  RDW 13.7 13.5 13.2  PLT 240 257 264   BNP Recent Labs  Lab 08/11/2022 0900  BNP 1,585.6*    DDimer  Recent Labs  Lab 07/29/2022 0900  DDIMER 3.00*     Radiology/Studies:  ECHOCARDIOGRAM COMPLETE  Result Date: 08/11/2022    ECHOCARDIOGRAM REPORT   Patient Name:   Tracy Peters [facility] Date of Exam: 08/11/2022 Medical Rec #:  [id number]              Height:       64.0 in Accession #:    [id number]             Weight:       120.0 lb Date of Birth:  02-20-1934               BSA:          1.575 m Patient Age:    59 years               BP:  118/68 mmHg Patient Gender: F                      HR:           97 bpm. Exam Location:  Inpatient Procedure: 2D Echo, Cardiac Doppler and Color Doppler Indications:    [id number] Acute diastolic (congestive) heart failure  History:        Patient has no prior history of Echocardiogram examinations.                 CAD, Signs/Symptoms:Shortness of Breath; Risk                 Factors:Non-Smoker, Hypertension and Dyslipidemia.  Sonographer:    Wilkie Aye RVT RCS Referring Phys: [place]  Sonographer Comments: Suboptimal parasternal window. IMPRESSIONS  1. Left ventricular ejection fraction, by estimation, is 55 to 60%. The left ventricle has normal function. The left ventricle has no regional wall motion abnormalities. Left ventricular diastolic function could not be evaluated. There is severe hypokinesis of the left ventricular, basal-mid inferoseptal wall and inferior wall.  2. Right ventricular systolic function is normal. The right ventricular size is normal. There is normal pulmonary artery systolic pressure. The estimated right ventricular systolic pressure is Q000111Q mmHg.  3. The mitral valve is degenerative. Mild mitral valve regurgitation. No evidence of mitral stenosis. Moderate mitral annular calcification.  4. The aortic valve is calcified. There is severe calcifcation of the aortic valve. There is severe thickening of the aortic valve. Aortic valve regurgitation is not visualized. Severe aortic valve stenosis. Aortic valve area, by VTI measures 0.46 cm. Aortic valve mean gradient measures 18.0 mmHg. Aortic valve Vmax measures 3.02 m/s. Although the mean AVG is mild Vmax is not in the severe range, the SVI is low at 15 and DVI 0.18. These findings are consistent with paradoxical low flow low gradient severe AS.  5. The inferior vena cava is normal in size with greater than 50% respiratory variability, suggesting right atrial pressure of 3 mmHg. FINDINGS  Left Ventricle: Left ventricular ejection fraction, by estimation, is 55 to 60%. The left ventricle has normal function. The left ventricle has no regional wall motion abnormalities. Severe hypokinesis of the left ventricular, basal-mid inferoseptal wall and inferior wall. The left ventricular internal cavity size was normal in size. There is no left ventricular hypertrophy. Left ventricular diastolic function could not be evaluated. Right Ventricle: The right ventricular size is  normal. No increase in right ventricular wall thickness. Right ventricular systolic function is normal. There is normal pulmonary artery systolic pressure. The tricuspid regurgitant velocity is 2.31 m/s, and  with an assumed right atrial pressure of 3 mmHg, the estimated right ventricular systolic pressure is Q000111Q mmHg. Left Atrium: Left atrial size was normal in size. Right Atrium: Right atrial size was normal in size. Pericardium: There is no evidence of pericardial effusion. Mitral Valve: The mitral valve is degenerative in appearance. There is moderate thickening of the mitral valve leaflet(s). There is moderate calcification of the mitral valve leaflet(s). Moderate mitral annular calcification. Mild mitral valve regurgitation. No evidence of mitral valve stenosis. MV peak gradient, 5.5 mmHg. The mean mitral valve gradient is 2.0 mmHg. Tricuspid Valve: The tricuspid valve is normal in structure. Tricuspid valve regurgitation is mild . No evidence of tricuspid stenosis. Aortic Valve: The aortic valve is calcified. There is severe calcifcation of the aortic valve. There is severe thickening of the aortic valve. Aortic valve regurgitation is  not visualized. Severe aortic stenosis is present. Aortic valve mean gradient measures 18.0 mmHg. Aortic valve peak gradient measures 36.5 mmHg. Aortic valve area, by VTI measures 0.46 cm. Pulmonic Valve: The pulmonic valve was normal in structure. Pulmonic valve regurgitation is trivial. No evidence of pulmonic stenosis. Aorta: The aortic root is normal in size and structure. Venous: The inferior vena cava is normal in size with greater than 50% respiratory variability, suggesting right atrial pressure of 3 mmHg. IAS/Shunts: No atrial level shunt detected by color flow Doppler.  LEFT VENTRICLE PLAX 2D LVIDd:         4.00 cm LVIDs:         2.80 cm LV PW:         0.90 cm LV IVS:        1.00 cm LVOT diam:     1.80 cm LV SV:         23 LV SV Index:   15 LVOT Area:     2.54 cm   RIGHT VENTRICLE             IVC RV Basal diam:  2.80 cm     IVC diam: 1.70 cm RV S prime:     15.70 cm/s TAPSE (M-mode): 2.5 cm LEFT ATRIUM             Index        RIGHT ATRIUM          Index LA diam:        3.80 cm 2.41 cm/m   RA Area:     6.82 cm LA Vol (A2C):   30.3 ml 19.24 ml/m  RA Volume:   14.40 ml 9.15 ml/m LA Vol (A4C):   46.3 ml 29.40 ml/m LA Biplane Vol: 38.6 ml 24.51 ml/m  AORTIC VALVE                     PULMONIC VALVE AV Area (Vmax):    0.53 cm      PV Vmax:          1.01 m/s AV Area (Vmean):   0.53 cm      PV Peak grad:     4.1 mmHg AV Area (VTI):     0.46 cm      PR End Diast Vel: 6.55 msec AV Vmax:           302.00 cm/s AV Vmean:          193.000 cm/s AV VTI:            0.501 m AV Peak Grad:      36.5 mmHg AV Mean Grad:      18.0 mmHg LVOT Vmax:         62.50 cm/s LVOT Vmean:        39.900 cm/s LVOT VTI:          0.090 m LVOT/AV VTI ratio: 0.18  AORTA Ao Root diam: 2.50 cm Ao Asc diam:  3.30 cm Ao Arch diam: 2.3 cm MITRAL VALVE             TRICUSPID VALVE MV Area VTI:  1.23 cm   TR Peak grad:   21.3 mmHg MV Peak grad: 5.5 mmHg   TR Vmax:        231.00 cm/s MV Mean grad: 2.0 mmHg MV Vmax:      1.17 m/s   SHUNTS MV Vmean:     70.7 cm/s  Systemic VTI:  0.09 m  Systemic Diam: 1.80 cm Fransico Him MD Electronically signed by Fransico Him MD Signature Date/Time: 08/11/2022/9:11:31 AM    Final    NM Pulmonary Perfusion  Result Date: 07/30/2022 CLINICAL DATA:  Pulmonary embolism (PE) suspected, high prob Shortness of breath.  Chest pain. EXAM: NUCLEAR MEDICINE PERFUSION LUNG SCAN TECHNIQUE: Perfusion images were obtained in multiple projections after intravenous injection of radiopharmaceutical. Ventilation scans intentionally deferred if perfusion scan and chest x-ray adequate for interpretation during COVID 19 epidemic. RADIOPHARMACEUTICALS:  4 mCi Tc-65mMAA IV COMPARISON:  Chest radiograph earlier today. FINDINGS: Homogeneous perfusion throughout both lungs. No  peripheral or wedge-shaped defects to suggest pulmonary embolus. IMPRESSION: No scintigraphic evidence of pulmonary embolus. Electronically Signed   By: MKeith RakeM.D.   On: 07/26/2022 17:19   DG Knee Complete 4 Views Right  Result Date: 07/29/2022 CLINICAL DATA:  RIGHT knee pain EXAM: RIGHT KNEE - COMPLETE 4+ VIEW COMPARISON:  None Available. FINDINGS: Osseous alignment is within normal limits. No fracture line or displaced fracture fragment. No acute-appearing cortical irregularity or osseous lesion. Tricompartmental degenerative osteoarthritic changes, mild to moderate in degree with joint space narrowing and mild osseous spurring. Faint calcifications within the medial and lateral compartments, compatible with associated chondrocalcinosis. Probable small joint effusion within the suprapatellar bursa. Extensive atherosclerosis of the superficial femoral artery. Superficial soft tissues about the RIGHT knee are otherwise unremarkable. IMPRESSION: 1. No acute-appearing osseous abnormality. 2. Probable small joint effusion. 3. Tricompartmental degenerative osteoarthritic changes, mild to moderate in degree. 4. Chondrocalcinosis within the medial and lateral compartments, compatible with underlying CPPD. 5. Extensive atherosclerosis of the superficial femoral artery. Electronically Signed   By: SFranki CabotM.D.   On: 07/20/2022 10:41   DG Foot Complete Right  Result Date: 07/30/2022 CLINICAL DATA:  RIGHT foot pain EXAM: RIGHT FOOT COMPLETE - 3+ VIEW COMPARISON:  None Available. FINDINGS: No acute-appearing osseous abnormality. No fracture line or displaced fracture fragment is seen, although osteopenia limits characterization of osseous detail. Soft tissues about the RIGHT w foot are unremarkable. Degenerative osteoarthritic changes at the first MTP joint, at least moderate in degree with joint space loss and articular surface sclerosis. Milder degenerative changes are seen at the TMT,  interphalangeal joint spaces and midfoot. IMPRESSION: 1. No acute findings. 2. Degenerative osteoarthritic changes, as detailed above. 3. Osteopenia. Electronically Signed   By: SFranki CabotM.D.   On: 07/17/2022 10:39   DG Chest Port 1 View  Result Date: 07/22/2022 CLINICAL DATA:  Chest pain and wheezing since yesterday. EXAM: PORTABLE CHEST 1 VIEW COMPARISON:  Chest x-rays dated 05/17/2021 and 02/05/2020 FINDINGS: Coarse lung markings are seen bilaterally. Increased interstitial markings are seen throughout both lungs. More confluent opacity at the LEFT lung base. Probable small LEFT pleural effusion. No pneumothorax is seen. Heart size and mediastinal contours are stable. IMPRESSION: 1. Increased interstitial markings throughout both lungs, most likely pulmonary edema and/or atypical pneumonia superimposed on chronic interstitial lung disease, alternatively a rapidly worsening chronic interstitial lung disease. 2. More confluent opacity at the LEFT lung base, atelectasis versus pneumonia. 3. Probable small LEFT pleural effusion. Electronically Signed   By: SFranki CabotM.D.   On: 08/11/2022 10:36    STS Risk Calculator: Procedure: AV Replacement  Procedure Type: Isolated AVR PERIOPERATIVE OUTCOME ESTIMATE % Operative Mortality 5.12% Morbidity & Mortality 11.8% Stroke 2.7% Renal Failure 2.64% Reoperation 3.75% Prolonged Ventilation 6.35% Deep Sternal Wound Infection 0.033% [facility] 6.85% Short Hospital Stay (<6 days)* 27.5%  Assessment and Plan:  Tracy Peters is a 89 y.o. female with symptoms of severe, stage D3 aortic stenosis with NYHA Class III/IV symptoms. I have reviewed the patient's recent echocardiogram which is notable for normal LV systolic function and paradoxical low flow low gradient severe aortic stenosis with peak gradient of 36.45mHg and mean transvalvular gradient of 160mg. The patient's dimensionless index is 0.18 and calculated aortic  valve area is 0.46 cm.    I have reviewed the natural history of aortic stenosis with the patient. We have discussed the limitations of medical therapy and the poor prognosis associated with symptomatic aortic stenosis. We have reviewed potential treatment options, including palliative medical therapy, conventional surgical aortic valve replacement, and transcatheter aortic valve replacement. We discussed treatment options in the context of this patient's specific comorbid medical conditions.    The patient's predicted risk of mortality with conventional aortic valve replacement is 5.12% primarily based on recent NSTEMI with unknown coronary anatomy, acute diastolic HF, moderate AKI, and advanced age.   Other significant comorbid conditions include hx of DVT on chronic coumadin. TAVR seems like a reasonable treatment option for this patient pending formal cardiac surgical consultation. We discussed typical evaluation which will require a R/LHC which is planned for later today if INR improved. She will also require a gated cardiac CTA and a CTA of the chest/abdomen/pelvis to evaluate both her cardiac anatomy and peripheral vasculature. S  For questions or updates, please contact [facility] consult [email or web address] for contact info under    Signed, JiKathyrn DrownNP  07/17/2022 9:41 AM   I have personally seen and examined this patient. I agree with the assessment and plan as outlined above. [age]o female with history of HTN, HLD, prior DVT with digital thrombus on long term coumadin therapy, new finding of severe paradoxical low flow/low gradient aortic stenosis that we are asked to see today to discuss her aortic stenosis and possible TAVR. She has not been followed by cardiology prior to this admission. She was admitted on 07/22/2022 with chest pain and dyspnea. She was felt to be volume overloaded and was diuresed with IV Lasix with improvement in her dyspnea. Mild troponin elevation with  flat trend (peak HsTrop 250). Echo with normal LV function. Severe paradoxical low flow/low gradient AS (mean gradient 18 mmHg, AVA 0.46 cm2, DI 0.18, SVI 15). Mild MR. She is currently awaiting a right and left heart cath. INR 2.0 earlier this am.   I have personally reviewed the echo images, EKG and all available labs.   She has no complaints today.   My exam:  General: Well developed, well nourished, NAD  HEENT: OP clear, mucus membranes moist  SKIN: warm, dry. No rashes. Neuro: No focal deficits  Musculoskeletal: Muscle strength 5/5 all ext  Psychiatric: Mood and affect normal  Neck: No JVD, no carotid bruits, no thyromegaly, no lymphadenopathy.  Lungs:Clear bilaterally, no wheezes, rhonci, crackles Cardiovascular: Regular rate and rhythm. Harsh systolic murmur.  Abdomen:Soft. Bowel sounds present. Non-tender.  Extremities: No lower extremity edema. Pulses are 2 + in the bilateral DP/PT.  Plan:  1. Severe Aortic Valve Stenosis: She has severe, stage D2 aortic valve stenosis (low flow/low gradient). I have personally reviewed the echo images. NYHA class 3 symptoms. The aortic valve is thickened, calcified with limited leaflet mobility. I think she would benefit from AVR. Given advanced age, she is not a good candidate for conventional AVR by surgical approach. I think she may be a good candidate for TAVR.  I have reviewed the natural history of aortic stenosis with the patient and their family members  who are present today. We have discussed the limitations of medical therapy and the poor prognosis associated with symptomatic aortic stenosis. We have reviewed potential treatment options, including palliative medical therapy, conventional surgical aortic valve replacement, and transcatheter aortic valve replacement. We discussed treatment options in the context of the patient's specific comorbid medical conditions.   She would like to proceed with planning for TAVR. Currently awaiting  right and left heart catheterization pending INR level. Risks and benefits of the cath procedure and the valve procedure are reviewed with the patient. After the cath, she will have a cardiac CT, CTA of the chest/abdomen and pelvis. We will hopefully be able to perform this before discharge home pending her renal function. She will then be referred to see one of the CT surgeons on our TAVR team.   Lauree Chandler, MD, [facility] 07/28/2022 10:11 AM

## 2022-08-11 NOTE — Progress Notes (Addendum)
PROGRESS NOTE    Tracy Peters  [id number] DOB: May 28, 1934 DOA: 07/23/2022 PCP: Tracy Anger, MD  Chief Complaint  Patient presents with   Chest Pain   Wheezing    Brief Narrative:   Tracy Peters is Tracy Peters 89 y.o. female past medical history of DVT on Coumadin, peripheral vascular disease, hyperlipidemia hypertension comes to the ED for shortness of breath that started 2 weeks prior to admission she relates that has progressively gotten worst, she started having chest pain about 2 days prior to admission while watching TV.  Symptoms persisted through the night she woke up with Tracy Peters cough relates chest pain substernal nonradiating, she relates that her breathing gets worse with ambulation along with her chest pain her chest pain resolved after 5 minutes of sitting down she denies any new medications or changes in her medication denies any fever nausea vomiting   In the ED: He was found to be tachycardic satting 98% on room air, brain atretic peptide of 1500 cardiac biomarkers of 200 INR 2.2 SARS-CoV-2 and influenza PCR were negative, chest x-ray showed pulmonary edema twelve-lead EKG as below  Assessment & Plan:   Principal Problem:   Elevated troponin I level Active Problems:   Essential hypertension   NSTEMI (non-ST elevated myocardial infarction) (HCC)   Elevated d-dimer   Elevated troponin  Shortness of Breath HFpEF  Aortic Stenosis Appreciate cards recs Continue lasix BID Echo with severe AS, severe hypokinesis of LV, basal mid inferoseptal wall and inferior wall  Strict I/O, daily weights  Chest Pain  NTEMI  Elevated Troponin Appreciate cardiology recommendations Heparin gtt when INR <2  Continue aspirin Follow A1c, lipids Losartan, crestor, imdur, metoprolol   Essential hypertension Metoprolol, losartan  Hx DVT Heparin while warfarin on hold - INR currently therapeutic - per cards  Abnormal CXR No infectious symptoms, monitor - suspect  related to HF above Repeat as indicated    DVT prophylaxis: heparin gtt Code Status: full Family Communication: husband at bedside Disposition:   Status is: Inpatient Remains inpatient appropriate because: continued cardiac w/u    Consultants:  cardiology  Procedures:  Echo IMPRESSIONS     1. Left ventricular ejection fraction, by estimation, is 55 to 60%. The  left ventricle has normal function. The left ventricle has no regional  wall motion abnormalities. Left ventricular diastolic function could not  be evaluated. There is severe  hypokinesis of the left ventricular, basal-mid inferoseptal wall and  inferior wall.   2. Right ventricular systolic function is normal. The right ventricular  size is normal. There is normal pulmonary artery systolic pressure. The  estimated right ventricular systolic pressure is Q000111Q mmHg.   3. The mitral valve is degenerative. Mild mitral valve regurgitation. No  evidence of mitral stenosis. Moderate mitral annular calcification.   4. The aortic valve is calcified. There is severe calcifcation of the  aortic valve. There is severe thickening of the aortic valve. Aortic valve  regurgitation is not visualized. Severe aortic valve stenosis. Aortic  valve area, by VTI measures 0.46 cm.  Aortic valve mean gradient measures 18.0 mmHg. Aortic valve Vmax measures  3.02 m/s. Although the mean AVG is mild Vmax is not in the severe range,  the SVI is low at 15 and DVI 0.18. These findings are consistent with  paradoxical low flow low gradient  severe AS.   5. The inferior vena cava is normal in size with greater than 50%  respiratory variability, suggesting right atrial pressure of 3  mmHg.   Antimicrobials:  Anti-infectives (From admission, onward)    Start     Dose/Rate Route Frequency Ordered Stop   08/09/2022 1145  cefTRIAXone (ROCEPHIN) 1 g in sodium chloride 0.9 % 100 mL IVPB        1 g 200 mL/hr over 30 Minutes Intravenous  Once 07/24/2022  1132 07/17/2022 1313   08/01/2022 1145  doxycycline (VIBRA-TABS) tablet 100 mg        100 mg Oral  Once 07/20/2022 1132 07/30/2022 1236       Subjective: Doesn't feel well Feels SOB  Objective: Vitals:   08/08/2022 2132 08/11/22 0116 08/11/22 0444 08/11/22 0901  BP: 110/73 120/70 118/68 127/70  Pulse: (!) 109 98 91 91  Resp: '18 18 18 18  '$ Temp: 97.6 F (36.4 C) 97.8 F (36.6 C) 97.6 F (36.4 C) 98.4 F (36.9 C)  TempSrc: Oral Oral Oral Oral  SpO2: 97% 99% 100% 99%  Weight:      Height:        Intake/Output Summary (Last 24 hours) at 08/11/2022 1002 Last data filed at 08/11/2022 0454 Gross per 24 hour  Intake 335.66 ml  Output 550 ml  Net -214.34 ml   Filed Weights   07/23/2022 1432  Weight: 54.4 kg    Examination:  General exam: mildly uncomfortable  Respiratory system: Clear to auscultation. Respiratory effort normal. Cardiovascular system: systolic murmur, regular Gastrointestinal system: Abdomen is nondistended, soft and nontender.  Central nervous system: Alert and oriented. No focal neurological deficits. Extremities: no LEE   Data Reviewed: I have personally reviewed following labs and imaging studies  CBC: Recent Labs  Lab 07/18/2022 0900 08/11/22 0541  WBC 7.1 7.3  HGB 12.3 11.0*  HCT 37.1 32.5*  MCV 99.5 99.1  PLT 240 [id number]    Basic Metabolic Panel: Recent Labs  Lab 08/01/2022 0900 07/20/2022 1056 08/11/22 0541  NA 135  --  135  K 3.9  --  3.6  CL 101  --  100  CO2 21*  --  24  GLUCOSE 94  --  100*  BUN 15  --  15  CREATININE 0.99  --  1.08*  CALCIUM 9.4  --  8.5*  MG  --  1.8  --     GFR: Estimated Creatinine Clearance: 32.1 mL/min (Tracy Peters) (by C-G formula based on SCr of 1.08 mg/dL (H)).  Liver Function Tests: No results for input(s): "AST", "ALT", "ALKPHOS", "BILITOT", "PROT", "ALBUMIN" in the last 168 hours.  CBG: Recent Labs  Lab 08/01/2022 2146  GLUCAP 122*     Recent Results (from the past 240 hour(s))  Resp panel by RT-PCR (RSV, Flu  Tracy Peters&B, Covid) Anterior Nasal Swab     Status: None   Collection Time: 08/08/2022  9:12 AM   Specimen: Anterior Nasal Swab  Result Value Ref Range Status   SARS Coronavirus 2 by RT PCR NEGATIVE NEGATIVE Final    Comment: (NOTE) SARS-CoV-2 target nucleic acids are NOT DETECTED.  The SARS-CoV-2 RNA is generally detectable in upper respiratory specimens during the acute phase of infection. The lowest concentration of SARS-CoV-2 viral copies this assay can detect is 138 copies/mL. Gean Laursen negative result does not preclude SARS-Cov-2 infection and should not be used as the sole basis for treatment or other patient management decisions. Shatavia Santor negative result may occur with  improper specimen collection/handling, submission of specimen other than nasopharyngeal swab, presence of viral mutation(s) within the areas targeted by this assay, and inadequate number of viral copies(<138 copies/mL).  Glee Lashomb negative result must be combined with clinical observations, patient history, and epidemiological information. The expected result is Negative.  Fact Sheet for Patients:  EntrepreneurPulse.com.au  Fact Sheet for Healthcare Providers:  IncredibleEmployment.be  This test is no t yet approved or cleared by the [place] FDA and  has been authorized for detection and/or diagnosis of SARS-CoV-2 by FDA under an Emergency Use Authorization (EUA). This EUA will remain  in effect (meaning this test can be used) for the duration of the COVID-19 declaration under Section 564(b)(1) of the Act, 21 U.S.C.section 360bbb-3(b)(1), unless the authorization is terminated  or revoked sooner.       Influenza Aadyn Buchheit by PCR NEGATIVE NEGATIVE Final   Influenza B by PCR NEGATIVE NEGATIVE Final    Comment: (NOTE) The Xpert Xpress SARS-CoV-2/FLU/RSV plus assay is intended as an aid in the diagnosis of influenza from Nasopharyngeal swab specimens and should not be used as Ayerim Berquist sole basis for treatment.  Nasal washings and aspirates are unacceptable for Xpert Xpress SARS-CoV-2/FLU/RSV testing.  Fact Sheet for Patients: EntrepreneurPulse.com.au  Fact Sheet for Healthcare Providers: IncredibleEmployment.be  This test is not yet approved or cleared by the [place] FDA and has been authorized for detection and/or diagnosis of SARS-CoV-2 by FDA under an Emergency Use Authorization (EUA). This EUA will remain in effect (meaning this test can be used) for the duration of the COVID-19 declaration under Section 564(b)(1) of the Act, 21 U.S.C. section 360bbb-3(b)(1), unless the authorization is terminated or revoked.     Resp Syncytial Virus by PCR NEGATIVE NEGATIVE Final    Comment: (NOTE) Fact Sheet for Patients: EntrepreneurPulse.com.au  Fact Sheet for Healthcare Providers: IncredibleEmployment.be  This test is not yet approved or cleared by the [place] FDA and has been authorized for detection and/or diagnosis of SARS-CoV-2 by FDA under an Emergency Use Authorization (EUA). This EUA will remain in effect (meaning this test can be used) for the duration of the COVID-19 declaration under Section 564(b)(1) of the Act, 21 U.S.C. section 360bbb-3(b)(1), unless the authorization is terminated or revoked.  Performed at [facility], [address] [place], [place] [address]          Radiology Studies: ECHOCARDIOGRAM COMPLETE  Result Date: 08/11/2022    ECHOCARDIOGRAM REPORT   Patient Name:   ASHBY LIFF [facility] Date of Exam: 08/11/2022 Medical Rec #:  [id number]              Height:       64.0 in Accession #:    [id number]             Weight:       120.0 lb Date of Birth:  1933/09/18               BSA:          1.575 m Patient Age:    77 years               BP:           118/68 mmHg Patient Gender: F                      HR:           97 bpm. Exam Location:  Inpatient Procedure: 2D  Echo, Cardiac Doppler and Color Doppler Indications:    [id number] Acute diastolic (congestive) heart failure  History:        Patient has no prior history of Echocardiogram examinations.  CAD, Signs/Symptoms:Shortness of Breath; Risk                 Factors:Non-Smoker, Hypertension and Dyslipidemia.  Sonographer:    Wilkie Aye RVT RCS Referring Phys: [place]  Sonographer Comments: Suboptimal parasternal window. IMPRESSIONS  1. Left ventricular ejection fraction, by estimation, is 55 to 60%. The left ventricle has normal function. The left ventricle has no regional wall motion abnormalities. Left ventricular diastolic function could not be evaluated. There is severe hypokinesis of the left ventricular, basal-mid inferoseptal wall and inferior wall.  2. Right ventricular systolic function is normal. The right ventricular size is normal. There is normal pulmonary artery systolic pressure. The estimated right ventricular systolic pressure is Q000111Q mmHg.  3. The mitral valve is degenerative. Mild mitral valve regurgitation. No evidence of mitral stenosis. Moderate mitral annular calcification.  4. The aortic valve is calcified. There is severe calcifcation of the aortic valve. There is severe thickening of the aortic valve. Aortic valve regurgitation is not visualized. Severe aortic valve stenosis. Aortic valve area, by VTI measures 0.46 cm. Aortic valve mean gradient measures 18.0 mmHg. Aortic valve Vmax measures 3.02 m/s. Although the mean AVG is mild Vmax is not in the severe range, the SVI is low at 15 and DVI 0.18. These findings are consistent with paradoxical low flow low gradient severe AS.  5. The inferior vena cava is normal in size with greater than 50% respiratory variability, suggesting right atrial pressure of 3 mmHg. FINDINGS  Left Ventricle: Left ventricular ejection fraction, by estimation, is 55 to 60%. The left ventricle has normal function. The left ventricle has no  regional wall motion abnormalities. Severe hypokinesis of the left ventricular, basal-mid inferoseptal wall and inferior wall. The left ventricular internal cavity size was normal in size. There is no left ventricular hypertrophy. Left ventricular diastolic function could not be evaluated. Right Ventricle: The right ventricular size is normal. No increase in right ventricular wall thickness. Right ventricular systolic function is normal. There is normal pulmonary artery systolic pressure. The tricuspid regurgitant velocity is 2.31 m/s, and  with an assumed right atrial pressure of 3 mmHg, the estimated right ventricular systolic pressure is Q000111Q mmHg. Left Atrium: Left atrial size was normal in size. Right Atrium: Right atrial size was normal in size. Pericardium: There is no evidence of pericardial effusion. Mitral Valve: The mitral valve is degenerative in appearance. There is moderate thickening of the mitral valve leaflet(s). There is moderate calcification of the mitral valve leaflet(s). Moderate mitral annular calcification. Mild mitral valve regurgitation. No evidence of mitral valve stenosis. MV peak gradient, 5.5 mmHg. The mean mitral valve gradient is 2.0 mmHg. Tricuspid Valve: The tricuspid valve is normal in structure. Tricuspid valve regurgitation is mild . No evidence of tricuspid stenosis. Aortic Valve: The aortic valve is calcified. There is severe calcifcation of the aortic valve. There is severe thickening of the aortic valve. Aortic valve regurgitation is not visualized. Severe aortic stenosis is present. Aortic valve mean gradient measures 18.0 mmHg. Aortic valve peak gradient measures 36.5 mmHg. Aortic valve area, by VTI measures 0.46 cm. Pulmonic Valve: The pulmonic valve was normal in structure. Pulmonic valve regurgitation is trivial. No evidence of pulmonic stenosis. Aorta: The aortic root is normal in size and structure. Venous: The inferior vena cava is normal in size with greater than  50% respiratory variability, suggesting right atrial pressure of 3 mmHg. IAS/Shunts: No atrial level shunt detected by color flow Doppler.  LEFT VENTRICLE PLAX  2D LVIDd:         4.00 cm LVIDs:         2.80 cm LV PW:         0.90 cm LV IVS:        1.00 cm LVOT diam:     1.80 cm LV SV:         23 LV SV Index:   15 LVOT Area:     2.54 cm  RIGHT VENTRICLE             IVC RV Basal diam:  2.80 cm     IVC diam: 1.70 cm RV S prime:     15.70 cm/s TAPSE (M-mode): 2.5 cm LEFT ATRIUM             Index        RIGHT ATRIUM          Index LA diam:        3.80 cm 2.41 cm/m   RA Area:     6.82 cm LA Vol (A2C):   30.3 ml 19.24 ml/m  RA Volume:   14.40 ml 9.15 ml/m LA Vol (A4C):   46.3 ml 29.40 ml/m LA Biplane Vol: 38.6 ml 24.51 ml/m  AORTIC VALVE                     PULMONIC VALVE AV Area (Vmax):    0.53 cm      PV Vmax:          1.01 m/s AV Area (Vmean):   0.53 cm      PV Peak grad:     4.1 mmHg AV Area (VTI):     0.46 cm      PR End Diast Vel: 6.55 msec AV Vmax:           302.00 cm/s AV Vmean:          193.000 cm/s AV VTI:            0.501 m AV Peak Grad:      36.5 mmHg AV Mean Grad:      18.0 mmHg LVOT Vmax:         62.50 cm/s LVOT Vmean:        39.900 cm/s LVOT VTI:          0.090 m LVOT/AV VTI ratio: 0.18  AORTA Ao Root diam: 2.50 cm Ao Asc diam:  3.30 cm Ao Arch diam: 2.3 cm MITRAL VALVE             TRICUSPID VALVE MV Area VTI:  1.23 cm   TR Peak grad:   21.3 mmHg MV Peak grad: 5.5 mmHg   TR Vmax:        231.00 cm/s MV Mean grad: 2.0 mmHg MV Vmax:      1.17 m/s   SHUNTS MV Vmean:     70.7 cm/s  Systemic VTI:  0.09 m                          Systemic Diam: 1.80 cm Fransico Him MD Electronically signed by Fransico Him MD Signature Date/Time: 08/11/2022/9:11:31 AM    Final    NM Pulmonary Perfusion  Result Date: 07/24/2022 CLINICAL DATA:  Pulmonary embolism (PE) suspected, high prob Shortness of breath.  Chest pain. EXAM: NUCLEAR MEDICINE PERFUSION LUNG SCAN TECHNIQUE: Perfusion images were obtained in multiple  projections after intravenous injection of radiopharmaceutical. Ventilation scans intentionally deferred  if perfusion scan and chest x-ray adequate for interpretation during COVID 19 epidemic. RADIOPHARMACEUTICALS:  4 mCi Tc-75mMAA IV COMPARISON:  Chest radiograph earlier today. FINDINGS: Homogeneous perfusion throughout both lungs. No peripheral or wedge-shaped defects to suggest pulmonary embolus. IMPRESSION: No scintigraphic evidence of pulmonary embolus. Electronically Signed   By: MKeith RakeM.D.   On: 08/09/2022 17:19   DG Knee Complete 4 Views Right  Result Date: 07/31/2022 CLINICAL DATA:  RIGHT knee pain EXAM: RIGHT KNEE - COMPLETE 4+ VIEW COMPARISON:  None Available. FINDINGS: Osseous alignment is within normal limits. No fracture line or displaced fracture fragment. No acute-appearing cortical irregularity or osseous lesion. Tricompartmental degenerative osteoarthritic changes, mild to moderate in degree with joint space narrowing and mild osseous spurring. Faint calcifications within the medial and lateral compartments, compatible with associated chondrocalcinosis. Probable small joint effusion within the suprapatellar bursa. Extensive atherosclerosis of the superficial femoral artery. Superficial soft tissues about the RIGHT knee are otherwise unremarkable. IMPRESSION: 1. No acute-appearing osseous abnormality. 2. Probable small joint effusion. 3. Tricompartmental degenerative osteoarthritic changes, mild to moderate in degree. 4. Chondrocalcinosis within the medial and lateral compartments, compatible with underlying CPPD. 5. Extensive atherosclerosis of the superficial femoral artery. Electronically Signed   By: SFranki CabotM.D.   On: 07/19/2022 10:41   DG Foot Complete Right  Result Date: 07/31/2022 CLINICAL DATA:  RIGHT foot pain EXAM: RIGHT FOOT COMPLETE - 3+ VIEW COMPARISON:  None Available. FINDINGS: No acute-appearing osseous abnormality. No fracture line or displaced fracture  fragment is seen, although osteopenia limits characterization of osseous detail. Soft tissues about the RIGHT w foot are unremarkable. Degenerative osteoarthritic changes at the first MTP joint, at least moderate in degree with joint space loss and articular surface sclerosis. Milder degenerative changes are seen at the TMT, interphalangeal joint spaces and midfoot. IMPRESSION: 1. No acute findings. 2. Degenerative osteoarthritic changes, as detailed above. 3. Osteopenia. Electronically Signed   By: SFranki CabotM.D.   On: 08/06/2022 10:39   DG Chest Port 1 View  Result Date: 08/03/2022 CLINICAL DATA:  Chest pain and wheezing since yesterday. EXAM: PORTABLE CHEST 1 VIEW COMPARISON:  Chest x-rays dated 05/17/2021 and 02/05/2020 FINDINGS: Coarse lung markings are seen bilaterally. Increased interstitial markings are seen throughout both lungs. More confluent opacity at the LEFT lung base. Probable small LEFT pleural effusion. No pneumothorax is seen. Heart size and mediastinal contours are stable. IMPRESSION: 1. Increased interstitial markings throughout both lungs, most likely pulmonary edema and/or atypical pneumonia superimposed on chronic interstitial lung disease, alternatively Alizea Pell rapidly worsening chronic interstitial lung disease. 2. More confluent opacity at the LEFT lung base, atelectasis versus pneumonia. 3. Probable small LEFT pleural effusion. Electronically Signed   By: SFranki CabotM.D.   On: 08/01/2022 10:36        Scheduled Meds:  aspirin EC  81 mg Oral Daily   famotidine  20 mg Oral Daily   furosemide  60 mg Intravenous BID   isosorbide mononitrate  30 mg Oral Daily   lacosamide  50 mg Oral BID   losartan  100 mg Oral Daily   metoprolol succinate  25 mg Oral Daily   potassium chloride  40 mEq Oral Once   [START ON 08/03/2022] rosuvastatin  20 mg Oral Daily   Continuous Infusions:  heparin 900 Units/hr (08/11/22 0107)     LOS: 1 day    Time spent: over 30 min    CFayrene Helper MD [facility]   To contact the  attending provider between 7A-7P or the covering provider during after hours 7P-7A, please log into the web site [email or web address] and access using universal [place] password for that web site. If you do not have the password, please call the hospital operator.  08/11/2022, 10:02 AM

## 2022-08-11 NOTE — Progress Notes (Addendum)
RN reports patient had sever chest pain after ambulating to the bathroom, was tachycardia with HR 140s. BP WNL.   Chart reviewed, severe AS, admitted for CHF and NSTEMI, pending cath once euvolemic.   Patient assessed at bedside, resting in bed, alert and oriented x3. She states she was having diarrhea because she ate ice cream earlier and she is lactase intolerant. She was feeling increasingly dizzy, diaphoretic, with mid-sternum chest pain. Symptoms similar to what she has been having at home, but more severe. Pain is now much improved with resting. Lung sounds clear bilaterally. [id number], systolic murmur grade III RUSB. Skin is warm and well perfused.    - Will repeat EKG - Advised minimize exertional activity, bedside commode  - discontinue PRN Nitro  - may consider PRN morphine for severe pain if BP allows >110/60, PRN tramadol can be continued - NPO after midnight, LHC/RHC planned tomorrow   No acute ischemic EKG change noted.

## 2022-08-11 NOTE — Progress Notes (Signed)
ANTICOAGULATION CONSULT NOTE  Pharmacy Consult for heparin Indication:  NSTEMI   Patient Measurements: Height: '5\' 4"'$  (162.6 cm) Weight: 54.4 kg (120 lb) IBW/kg (Calculated) : 54.7 Heparin Dosing Weight: TBW  Vital Signs: Temp: 98.4 F (36.9 C) (02/26 0901) Temp Source: Oral (02/26 0901) BP: 127/70 (02/26 0901) Pulse Rate: 91 (02/26 0901)  Labs: Recent Labs    08/03/2022 0900 07/18/2022 1056 07/23/2022 1616 08/07/2022 1743 07/28/2022 1933 08/11/22 0541  HGB 12.3  --   --   --   --  11.0*  HCT 37.1  --   --   --   --  32.5*  PLT 240  --   --   --   --  257  LABPROT 23.9*  --   --   --   --  26.7*  INR 2.2*  --   --   --   --  2.5*  HEPARINUNFRC  --   --   --   --  0.21* 0.54  CREATININE 0.99  --   --   --   --  1.08*  TROPONINIHS 199* 189* 241* 250*  --   --      Estimated Creatinine Clearance: 32.1 mL/min (A) (by C-G formula based on SCr of 1.08 mg/dL (H)).  Assessment: 68 YOF presenting with wheezing and CP, hx of VTE on warfarin PTA, INR therapeutic on admission at 2.2 however recently subtherapeutic, concern for PE awaiting transfer for VQ scan.  Discussed with EDP and will start conservatively with heparin, no bolus, pending VQ scan  Remains on heparin gtt while awaiting cath.  INR back up to 2.5 today (last Coumadin dose PTA 2/24).  Heparin level 0.54.  No overt bleeding or complications noted.  Goal of Therapy:  Heparin level 0.3-0.7 units/ml Monitor platelets by anticoagulation protocol: Yes   Plan:  Discussed with Dr. Johney Frame, will stop IV heparin until INR falls < 2. Monitor for s/sx of bleeding  Thank you for involving pharmacy in this patient's care.  [place] Crane, Roylene Reason, BCCP Clinical Pharmacist  08/11/2022 10:09 AM   [facility] pharmacy phone numbers are listed on [email or web address]

## 2022-08-12 ENCOUNTER — Encounter (HOSPITAL_COMMUNITY): Admission: EM | Disposition: E | Payer: Self-pay | Source: Home / Self Care | Attending: Internal Medicine

## 2022-08-12 DIAGNOSIS — I350 Nonrheumatic aortic (valve) stenosis: Secondary | ICD-10-CM

## 2022-08-12 DIAGNOSIS — I110 Hypertensive heart disease with heart failure: Secondary | ICD-10-CM | POA: Diagnosis not present

## 2022-08-12 DIAGNOSIS — I214 Non-ST elevation (NSTEMI) myocardial infarction: Secondary | ICD-10-CM | POA: Diagnosis not present

## 2022-08-12 DIAGNOSIS — I5033 Acute on chronic diastolic (congestive) heart failure: Secondary | ICD-10-CM | POA: Diagnosis not present

## 2022-08-12 DIAGNOSIS — R7989 Other specified abnormal findings of blood chemistry: Secondary | ICD-10-CM | POA: Diagnosis not present

## 2022-08-12 DIAGNOSIS — Z1152 Encounter for screening for COVID-19: Secondary | ICD-10-CM | POA: Diagnosis not present

## 2022-08-12 DIAGNOSIS — I5031 Acute diastolic (congestive) heart failure: Secondary | ICD-10-CM | POA: Diagnosis not present

## 2022-08-12 DIAGNOSIS — I21A1 Myocardial infarction type 2: Secondary | ICD-10-CM | POA: Diagnosis not present

## 2022-08-12 DIAGNOSIS — I10 Essential (primary) hypertension: Secondary | ICD-10-CM | POA: Diagnosis not present

## 2022-08-12 HISTORY — PX: RIGHT/LEFT HEART CATH AND CORONARY ANGIOGRAPHY: CATH118266

## 2022-08-12 LAB — POCT I-STAT 7, (LYTES, BLD GAS, ICA,H+H)
Acid-Base Excess: 3 mmol/L — ABNORMAL HIGH (ref 0.0–2.0)
Bicarbonate: 27.6 mmol/L (ref 20.0–28.0)
Calcium, Ion: 1.14 mmol/L — ABNORMAL LOW (ref 1.15–1.40)
HCT: 34 % — ABNORMAL LOW (ref 36.0–46.0)
Hemoglobin: 11.6 g/dL — ABNORMAL LOW (ref 12.0–15.0)
O2 Saturation: 93 %
Potassium: 3.5 mmol/L (ref 3.5–5.1)
Sodium: 137 mmol/L (ref 135–145)
TCO2: 29 mmol/L (ref 22–32)
pCO2 arterial: 41 mmHg (ref 32–48)
pH, Arterial: 7.437 (ref 7.35–7.45)
pO2, Arterial: 64 mmHg — ABNORMAL LOW (ref 83–108)

## 2022-08-12 LAB — COMPREHENSIVE METABOLIC PANEL
ALT: 11 U/L (ref 0–44)
AST: 18 U/L (ref 15–41)
Albumin: 3 g/dL — ABNORMAL LOW (ref 3.5–5.0)
Alkaline Phosphatase: 39 U/L (ref 38–126)
Anion gap: 11 (ref 5–15)
BUN: 20 mg/dL (ref 8–23)
CO2: 26 mmol/L (ref 22–32)
Calcium: 8.7 mg/dL — ABNORMAL LOW (ref 8.9–10.3)
Chloride: 99 mmol/L (ref 98–111)
Creatinine, Ser: 1.23 mg/dL — ABNORMAL HIGH (ref 0.44–1.00)
GFR, Estimated: 43 mL/min — ABNORMAL LOW (ref >60)
Glucose, Bld: 107 mg/dL — ABNORMAL HIGH (ref 70–99)
Potassium: 3.6 mmol/L (ref 3.5–5.1)
Sodium: 136 mmol/L (ref 135–145)
Total Bilirubin: 0.5 mg/dL (ref 0.3–1.2)
Total Protein: 6 g/dL — ABNORMAL LOW (ref 6.5–8.1)

## 2022-08-12 LAB — CBC WITH DIFFERENTIAL/PLATELET
Abs Immature Granulocytes: 0.01 10*3/uL (ref 0.00–0.07)
Basophils Absolute: 0 10*3/uL (ref 0.0–0.1)
Basophils Relative: 1 %
Eosinophils Absolute: 0.1 10*3/uL (ref 0.0–0.5)
Eosinophils Relative: 2 %
HCT: 32.7 % — ABNORMAL LOW (ref 36.0–46.0)
Hemoglobin: 10.9 g/dL — ABNORMAL LOW (ref 12.0–15.0)
Immature Granulocytes: 0 %
Lymphocytes Relative: 28 %
Lymphs Abs: 2.3 10*3/uL (ref 0.7–4.0)
MCH: 33.4 pg (ref 26.0–34.0)
MCHC: 33.3 g/dL (ref 30.0–36.0)
MCV: 100.3 fL — ABNORMAL HIGH (ref 80.0–100.0)
Monocytes Absolute: 0.9 10*3/uL (ref 0.1–1.0)
Monocytes Relative: 11 %
Neutro Abs: 4.8 10*3/uL (ref 1.7–7.7)
Neutrophils Relative %: 58 %
Platelets: 264 10*3/uL (ref 150–400)
RBC: 3.26 MIL/uL — ABNORMAL LOW (ref 3.87–5.11)
RDW: 13.2 % (ref 11.5–15.5)
WBC: 8.2 10*3/uL (ref 4.0–10.5)
nRBC: 0 % (ref 0.0–0.2)

## 2022-08-12 LAB — POCT I-STAT EG7
Acid-Base Excess: 4 mmol/L — ABNORMAL HIGH (ref 0.0–2.0)
Acid-Base Excess: 4 mmol/L — ABNORMAL HIGH (ref 0.0–2.0)
Bicarbonate: 28.9 mmol/L — ABNORMAL HIGH (ref 20.0–28.0)
Bicarbonate: 29.1 mmol/L — ABNORMAL HIGH (ref 20.0–28.0)
Calcium, Ion: 1.16 mmol/L (ref 1.15–1.40)
Calcium, Ion: 1.19 mmol/L (ref 1.15–1.40)
HCT: 33 % — ABNORMAL LOW (ref 36.0–46.0)
HCT: 35 % — ABNORMAL LOW (ref 36.0–46.0)
Hemoglobin: 11.2 g/dL — ABNORMAL LOW (ref 12.0–15.0)
Hemoglobin: 11.9 g/dL — ABNORMAL LOW (ref 12.0–15.0)
O2 Saturation: 63 %
O2 Saturation: 63 %
Potassium: 3.5 mmol/L (ref 3.5–5.1)
Potassium: 3.6 mmol/L (ref 3.5–5.1)
Sodium: 139 mmol/L (ref 135–145)
Sodium: 139 mmol/L (ref 135–145)
TCO2: 30 mmol/L (ref 22–32)
TCO2: 30 mmol/L (ref 22–32)
pCO2, Ven: 45.4 mmHg (ref 44–60)
pCO2, Ven: 46.4 mmHg (ref 44–60)
pH, Ven: 7.405 (ref 7.25–7.43)
pH, Ven: 7.411 (ref 7.25–7.43)
pO2, Ven: 32 mmHg (ref 32–45)
pO2, Ven: 33 mmHg (ref 32–45)

## 2022-08-12 LAB — PROTIME-INR
INR: 2 — ABNORMAL HIGH (ref 0.8–1.2)
INR: 1.8 — ABNORMAL HIGH (ref 0.8–1.2)
Prothrombin Time: 22.2 seconds — ABNORMAL HIGH (ref 11.4–15.2)
Prothrombin Time: 20.6 seconds — ABNORMAL HIGH (ref 11.4–15.2)

## 2022-08-12 LAB — MAGNESIUM: Magnesium: 1.7 mg/dL (ref 1.7–2.4)

## 2022-08-12 LAB — PHOSPHORUS: Phosphorus: 4.1 mg/dL (ref 2.5–4.6)

## 2022-08-12 LAB — MRSA NEXT GEN BY PCR, NASAL: MRSA by PCR Next Gen: NOT DETECTED

## 2022-08-12 SURGERY — RIGHT/LEFT HEART CATH AND CORONARY ANGIOGRAPHY
Anesthesia: LOCAL

## 2022-08-12 MED ORDER — IOHEXOL 350 MG/ML SOLN
INTRAVENOUS | Status: DC | PRN
  Administered 2022-08-12: 50 mL

## 2022-08-12 MED ORDER — METHYLPREDNISOLONE SODIUM SUCC 125 MG IJ SOLR
INTRAMUSCULAR | Status: DC | PRN
  Administered 2022-08-12: 125 mg via INTRAVENOUS

## 2022-08-12 MED ORDER — DIPHENHYDRAMINE HCL 50 MG/ML IJ SOLN
INTRAMUSCULAR | Status: AC
  Filled 2022-08-12: qty 1

## 2022-08-12 MED ORDER — SODIUM CHLORIDE 0.9 % IV SOLN: 250.0000 mL | INTRAVENOUS | Status: DC | PRN

## 2022-08-12 MED ORDER — HEPARIN (PORCINE) IN NACL 1000-0.9 UT/500ML-% IV SOLN
INTRAVENOUS | Status: DC | PRN
  Administered 2022-08-12 (×2): 500 mL

## 2022-08-12 MED ORDER — SODIUM CHLORIDE 0.9% FLUSH
3.0000 mL | Freq: Two times a day (BID) | INTRAVENOUS | Status: DC
  Administered 2022-08-13 – 2022-08-18 (×12): 3 mL via INTRAVENOUS

## 2022-08-12 MED ORDER — METHYLPREDNISOLONE SODIUM SUCC 125 MG IJ SOLR: 125.0000 mg | Freq: Once | INTRAMUSCULAR | Status: DC

## 2022-08-12 MED ORDER — MORPHINE SULFATE (PF) 2 MG/ML IV SOLN
2.0000 mg | Freq: Once | INTRAVENOUS | Status: AC
  Administered 2022-08-12: 2 mg via INTRAVENOUS
  Filled 2022-08-12: qty 1

## 2022-08-12 MED ORDER — FENTANYL CITRATE (PF) 100 MCG/2ML IJ SOLN
INTRAMUSCULAR | Status: AC
  Filled 2022-08-12: qty 2

## 2022-08-12 MED ORDER — HEPARIN SODIUM (PORCINE) 1000 UNIT/ML IJ SOLN
INTRAMUSCULAR | Status: DC | PRN
  Administered 2022-08-12: 3000 [IU] via INTRAVENOUS

## 2022-08-12 MED ORDER — SODIUM CHLORIDE 0.9% FLUSH: 3.0000 mL | Freq: Two times a day (BID) | INTRAVENOUS | Status: DC

## 2022-08-12 MED ORDER — LIDOCAINE HCL (PF) 1 % IJ SOLN
INTRAMUSCULAR | Status: AC
  Filled 2022-08-12: qty 30

## 2022-08-12 MED ORDER — HEPARIN SODIUM (PORCINE) 1000 UNIT/ML IJ SOLN
INTRAMUSCULAR | Status: AC
  Filled 2022-08-12: qty 10

## 2022-08-12 MED ORDER — FENTANYL CITRATE (PF) 100 MCG/2ML IJ SOLN
INTRAMUSCULAR | Status: DC | PRN
  Administered 2022-08-12: 25 ug via INTRAVENOUS

## 2022-08-12 MED ORDER — VERAPAMIL HCL 2.5 MG/ML IV SOLN
INTRAVENOUS | Status: AC
  Filled 2022-08-12: qty 2

## 2022-08-12 MED ORDER — LABETALOL HCL 5 MG/ML IV SOLN: 10.0000 mg | INTRAVENOUS | Status: AC | PRN

## 2022-08-12 MED ORDER — SODIUM CHLORIDE 0.9 % IV SOLN: INTRAVENOUS | Status: AC

## 2022-08-12 MED ORDER — HYDRALAZINE HCL 20 MG/ML IJ SOLN: 10.0000 mg | INTRAMUSCULAR | Status: AC | PRN

## 2022-08-12 MED ORDER — SODIUM CHLORIDE 0.9 % IV SOLN: INTRAVENOUS | Status: DC

## 2022-08-12 MED ORDER — HEPARIN (PORCINE) 25000 UT/250ML-% IV SOLN
900.0000 [IU]/h | INTRAVENOUS | Status: DC
  Administered 2022-08-12: 900 [IU]/h via INTRAVENOUS

## 2022-08-12 MED ORDER — MIDAZOLAM HCL 2 MG/2ML IJ SOLN
INTRAMUSCULAR | Status: DC | PRN
  Administered 2022-08-12: 1 mg via INTRAVENOUS

## 2022-08-12 MED ORDER — METHYLPREDNISOLONE SODIUM SUCC 125 MG IJ SOLR
INTRAMUSCULAR | Status: AC
  Filled 2022-08-12: qty 2

## 2022-08-12 MED ORDER — DIPHENHYDRAMINE HCL 50 MG/ML IJ SOLN
INTRAMUSCULAR | Status: DC | PRN
  Administered 2022-08-12: 25 mg via INTRAVENOUS

## 2022-08-12 MED ORDER — DIPHENHYDRAMINE HCL 50 MG/ML IJ SOLN: 25.0000 mg | Freq: Once | INTRAMUSCULAR | Status: DC

## 2022-08-12 MED ORDER — LIDOCAINE HCL (PF) 1 % IJ SOLN
INTRAMUSCULAR | Status: DC | PRN
  Administered 2022-08-12: 5 mL via INTRADERMAL

## 2022-08-12 MED ORDER — SODIUM CHLORIDE 0.9% FLUSH: 3.0000 mL | INTRAVENOUS | Status: DC | PRN

## 2022-08-12 MED ORDER — VERAPAMIL HCL 2.5 MG/ML IV SOLN
INTRAVENOUS | Status: DC | PRN
  Administered 2022-08-12: 7 mL via INTRA_ARTERIAL

## 2022-08-12 MED ORDER — HEPARIN (PORCINE) 25000 UT/250ML-% IV SOLN
800.0000 [IU]/h | INTRAVENOUS | Status: DC
  Administered 2022-08-12: 900 [IU]/h via INTRAVENOUS
  Administered 2022-08-13: 950 [IU]/h via INTRAVENOUS
  Administered 2022-08-15: 800 [IU]/h via INTRAVENOUS
  Filled 2022-08-12 (×3): qty 250

## 2022-08-12 MED ORDER — MIDAZOLAM HCL 2 MG/2ML IJ SOLN
INTRAMUSCULAR | Status: AC
  Filled 2022-08-12: qty 2

## 2022-08-12 SURGICAL SUPPLY — 11 items
BAND ZEPHYR COMPRESS 30 LONG (HEMOSTASIS) IMPLANT
CATH 5FR JL3.5 JR4 ANG PIG MP (CATHETERS) IMPLANT
CATH BALLN WEDGE 5F 110CM (CATHETERS) IMPLANT
GLIDESHEATH SLEND SS 6F .021 (SHEATH) IMPLANT
GUIDEWIRE INQWIRE 1.5J.035X260 (WIRE) IMPLANT
INQWIRE 1.5J .035X260CM (WIRE) ×1
KIT HEART LEFT (KITS) ×1 IMPLANT
PACK CARDIAC CATHETERIZATION (CUSTOM PROCEDURE TRAY) ×1 IMPLANT
SHEATH GLIDE SLENDER 4/5FR (SHEATH) IMPLANT
TRANSDUCER W/STOPCOCK (MISCELLANEOUS) ×1 IMPLANT
TUBING CIL FLEX 10 FLL-RA (TUBING) ×1 IMPLANT

## 2022-08-12 NOTE — Progress Notes (Signed)
PROGRESS NOTE    Tracy Peters  [id number] DOB: 1933/10/02 DOA: 07/24/2022 PCP: Tracy Anger, MD  Chief Complaint  Patient presents with   Chest Pain   Wheezing    Brief Narrative:   Tracy Peters is Tracy Peters 89 y.o. female past medical history of DVT on Coumadin, peripheral vascular disease, hyperlipidemia hypertension comes to the ED for shortness of breath that started 2 weeks prior to admission she relates that has progressively gotten worst, she started having chest pain about 2 days prior to admission while watching TV.  Symptoms persisted through the night she woke up with Tracy Peters cough relates chest pain substernal nonradiating, she relates that her breathing gets worse with ambulation along with her chest pain her chest pain resolved after 5 minutes of sitting down she denies any new medications or changes in her medication denies any fever nausea vomiting   In the ED: He was found to be tachycardic satting 98% on room air, brain atretic peptide of 1500 cardiac biomarkers of 200 INR 2.2 SARS-CoV-2 and influenza PCR were negative, chest x-ray showed pulmonary edema twelve-lead EKG as below  Assessment & Plan:   Principal Problem:   Elevated troponin I level Active Problems:   Essential hypertension   NSTEMI (non-ST elevated myocardial infarction) (HCC)   Elevated d-dimer   Elevated troponin  Shortness of Breath HFpEF  Aortic Stenosis Appreciate cards recs Diuresis per cardiology Echo with severe AS, severe hypokinesis of LV, basal mid inferoseptal wall and inferior wall  Strict I/O, daily weights  Chest Pain  NTEMI  Elevated Troponin Appreciate cardiology recommendations Heparin gtt when INR <2  Continue aspirin LHC with mild aortoostial disease involving the ostial RCA and ostial L main with less than 50% stenosis at both lesion sites, patent LAD, L circumflex, and RCA without any high grade stenoses, mild pHTN, severe calcific aortic stenosis ->  resume heparin in 8 hrs, TAVR evaluation with CT angio and CT surgery Follow A1c (pending), lipids LDL 104 Losartan, crestor, imdur, metoprolol   Essential hypertension Metoprolol, losartan  Hx DVT Heparin while warfarin on hold  INR 1.8 Heparin gtt while INR <2  Abnormal CXR No infectious symptoms, monitor - suspect related to HF above Repeat as indicated   DVT prophylaxis: heparin gtt Code Status: full Family Communication: husband at bedside Disposition:   Status is: Inpatient Remains inpatient appropriate because: continued cardiac w/u    Consultants:  cardiology  Procedures:  Echo IMPRESSIONS     1. Left ventricular ejection fraction, by estimation, is 55 to 60%. The  left ventricle has normal function. The left ventricle has no regional  wall motion abnormalities. Left ventricular diastolic function could not  be evaluated. There is severe  hypokinesis of the left ventricular, basal-mid inferoseptal wall and  inferior wall.   2. Right ventricular systolic function is normal. The right ventricular  size is normal. There is normal pulmonary artery systolic pressure. The  estimated right ventricular systolic pressure is Q000111Q mmHg.   3. The mitral valve is degenerative. Mild mitral valve regurgitation. No  evidence of mitral stenosis. Moderate mitral annular calcification.   4. The aortic valve is calcified. There is severe calcifcation of the  aortic valve. There is severe thickening of the aortic valve. Aortic valve  regurgitation is not visualized. Severe aortic valve stenosis. Aortic  valve area, by VTI measures 0.46 cm.  Aortic valve mean gradient measures 18.0 mmHg. Aortic valve Vmax measures  3.02 m/s. Although the mean AVG is  mild Vmax is not in the severe range,  the SVI is low at 15 and DVI 0.18. These findings are consistent with  paradoxical low flow low gradient  severe AS.   5. The inferior vena cava is normal in size with greater than 50%   respiratory variability, suggesting right atrial pressure of 3 mmHg.   Antimicrobials:  Anti-infectives (From admission, onward)    Start     Dose/Rate Route Frequency Ordered Stop   08/09/2022 1145  cefTRIAXone (ROCEPHIN) 1 g in sodium chloride 0.9 % 100 mL IVPB        1 g 200 mL/hr over 30 Minutes Intravenous  Once 07/24/2022 1132 07/31/2022 1313   07/24/2022 1145  doxycycline (VIBRA-TABS) tablet 100 mg        100 mg Oral  Once 07/24/2022 1132 08/13/2022 1236       Subjective: Feels generally poorly   Objective: Vitals:   08/04/2022 2132 08/11/22 0116 08/11/22 0444 08/11/22 0901  BP: 110/73 120/70 118/68 127/70  Pulse: (!) 109 98 91 91  Resp: '18 18 18 18  '$ Temp: 97.6 F (36.4 C) 97.8 F (36.6 C) 97.6 F (36.4 C) 98.4 F (36.9 C)  TempSrc: Oral Oral Oral Oral  SpO2: 97% 99% 100% 99%  Weight:      Height:        Intake/Output Summary (Last 24 hours) at 08/11/2022 1002 Last data filed at 08/11/2022 0454 Gross per 24 hour  Intake 335.66 ml  Output 550 ml  Net -214.34 ml   Filed Weights   08/03/2022 1432  Weight: 54.4 kg    Examination:  General: No acute distress. Cardiovascular: systolic murmur Lungs: diminished Abdomen: Soft, nontender, nondistended  Neurological: Alert and oriented 3. Moves all extremities 4 with equal strength. Cranial nerves II through XII grossly intact. Extremities: No clubbing or cyanosis. No edema.   Data Reviewed: I have personally reviewed following labs and imaging studies  CBC: Recent Labs  Lab 07/20/2022 0900 08/11/22 0541  WBC 7.1 7.3  HGB 12.3 11.0*  HCT 37.1 32.5*  MCV 99.5 99.1  PLT 240 [id number]    Basic Metabolic Panel: Recent Labs  Lab 08/11/2022 0900 07/29/2022 1056 08/11/22 0541  NA 135  --  135  K 3.9  --  3.6  CL 101  --  100  CO2 21*  --  24  GLUCOSE 94  --  100*  BUN 15  --  15  CREATININE 0.99  --  1.08*  CALCIUM 9.4  --  8.5*  MG  --  1.8  --     GFR: Estimated Creatinine Clearance: 32.1 mL/min (Tracy Peters) (by C-G  formula based on SCr of 1.08 mg/dL (H)).  Liver Function Tests: No results for input(s): "AST", "ALT", "ALKPHOS", "BILITOT", "PROT", "ALBUMIN" in the last 168 hours.  CBG: Recent Labs  Lab 08/05/2022 2146  GLUCAP 122*     Recent Results (from the past 240 hour(s))  Resp panel by RT-PCR (RSV, Flu Briauna Gilmartin&B, Covid) Anterior Nasal Swab     Status: None   Collection Time: 07/18/2022  9:12 AM   Specimen: Anterior Nasal Swab  Result Value Ref Range Status   SARS Coronavirus 2 by RT PCR NEGATIVE NEGATIVE Final    Comment: (NOTE) SARS-CoV-2 target nucleic acids are NOT DETECTED.  The SARS-CoV-2 RNA is generally detectable in upper respiratory specimens during the acute phase of infection. The lowest concentration of SARS-CoV-2 viral copies this assay can detect is 138 copies/mL. Tekia Waterbury negative result does  not preclude SARS-Cov-2 infection and should not be used as the sole basis for treatment or other patient management decisions. Addisynn Vassell negative result may occur with  improper specimen collection/handling, submission of specimen other than nasopharyngeal swab, presence of viral mutation(s) within the areas targeted by this assay, and inadequate number of viral copies(<138 copies/mL). Aarti Mankowski negative result must be combined with clinical observations, patient history, and epidemiological information. The expected result is Negative.  Fact Sheet for Patients:  EntrepreneurPulse.com.au  Fact Sheet for Healthcare Providers:  IncredibleEmployment.be  This test is no t yet approved or cleared by the [place] FDA and  has been authorized for detection and/or diagnosis of SARS-CoV-2 by FDA under an Emergency Use Authorization (EUA). This EUA will remain  in effect (meaning this test can be used) for the duration of the COVID-19 declaration under Section 564(b)(1) of the Act, 21 U.S.C.section 360bbb-3(b)(1), unless the authorization is terminated  or revoked sooner.        Influenza Contrina Orona by PCR NEGATIVE NEGATIVE Final   Influenza B by PCR NEGATIVE NEGATIVE Final    Comment: (NOTE) The Xpert Xpress SARS-CoV-2/FLU/RSV plus assay is intended as an aid in the diagnosis of influenza from Nasopharyngeal swab specimens and should not be used as Sheree Lalla sole basis for treatment. Nasal washings and aspirates are unacceptable for Xpert Xpress SARS-CoV-2/FLU/RSV testing.  Fact Sheet for Patients: EntrepreneurPulse.com.au  Fact Sheet for Healthcare Providers: IncredibleEmployment.be  This test is not yet approved or cleared by the [place] FDA and has been authorized for detection and/or diagnosis of SARS-CoV-2 by FDA under an Emergency Use Authorization (EUA). This EUA will remain in effect (meaning this test can be used) for the duration of the COVID-19 declaration under Section 564(b)(1) of the Act, 21 U.S.C. section 360bbb-3(b)(1), unless the authorization is terminated or revoked.     Resp Syncytial Virus by PCR NEGATIVE NEGATIVE Final    Comment: (NOTE) Fact Sheet for Patients: EntrepreneurPulse.com.au  Fact Sheet for Healthcare Providers: IncredibleEmployment.be  This test is not yet approved or cleared by the [place] FDA and has been authorized for detection and/or diagnosis of SARS-CoV-2 by FDA under an Emergency Use Authorization (EUA). This EUA will remain in effect (meaning this test can be used) for the duration of the COVID-19 declaration under Section 564(b)(1) of the Act, 21 U.S.C. section 360bbb-3(b)(1), unless the authorization is terminated or revoked.  Performed at [facility], [address], [place], [place] [address]          Radiology Studies: ECHOCARDIOGRAM COMPLETE  Result Date: 08/11/2022    ECHOCARDIOGRAM REPORT   Patient Name:   Tracy Peters [facility] Date of Exam: 08/11/2022 Medical Rec #:  [id number]               Height:       64.0 in Accession #:    [id number]             Weight:       120.0 lb Date of Birth:  March 12, 1934               BSA:          1.575 m Patient Age:    47 years               BP:           118/68 mmHg Patient Gender: F  HR:           97 bpm. Exam Location:  Inpatient Procedure: 2D Echo, Cardiac Doppler and Color Doppler Indications:    [id number] Acute diastolic (congestive) heart failure  History:        Patient has no prior history of Echocardiogram examinations.                 CAD, Signs/Symptoms:Shortness of Breath; Risk                 Factors:Non-Smoker, Hypertension and Dyslipidemia.  Sonographer:    Wilkie Aye RVT RCS Referring Phys: [place]  Sonographer Comments: Suboptimal parasternal window. IMPRESSIONS  1. Left ventricular ejection fraction, by estimation, is 55 to 60%. The left ventricle has normal function. The left ventricle has no regional wall motion abnormalities. Left ventricular diastolic function could not be evaluated. There is severe hypokinesis of the left ventricular, basal-mid inferoseptal wall and inferior wall.  2. Right ventricular systolic function is normal. The right ventricular size is normal. There is normal pulmonary artery systolic pressure. The estimated right ventricular systolic pressure is Q000111Q mmHg.  3. The mitral valve is degenerative. Mild mitral valve regurgitation. No evidence of mitral stenosis. Moderate mitral annular calcification.  4. The aortic valve is calcified. There is severe calcifcation of the aortic valve. There is severe thickening of the aortic valve. Aortic valve regurgitation is not visualized. Severe aortic valve stenosis. Aortic valve area, by VTI measures 0.46 cm. Aortic valve mean gradient measures 18.0 mmHg. Aortic valve Vmax measures 3.02 m/s. Although the mean AVG is mild Vmax is not in the severe range, the SVI is low at 15 and DVI 0.18. These findings are consistent with paradoxical low flow low  gradient severe AS.  5. The inferior vena cava is normal in size with greater than 50% respiratory variability, suggesting right atrial pressure of 3 mmHg. FINDINGS  Left Ventricle: Left ventricular ejection fraction, by estimation, is 55 to 60%. The left ventricle has normal function. The left ventricle has no regional wall motion abnormalities. Severe hypokinesis of the left ventricular, basal-mid inferoseptal wall and inferior wall. The left ventricular internal cavity size was normal in size. There is no left ventricular hypertrophy. Left ventricular diastolic function could not be evaluated. Right Ventricle: The right ventricular size is normal. No increase in right ventricular wall thickness. Right ventricular systolic function is normal. There is normal pulmonary artery systolic pressure. The tricuspid regurgitant velocity is 2.31 m/s, and  with an assumed right atrial pressure of 3 mmHg, the estimated right ventricular systolic pressure is Q000111Q mmHg. Left Atrium: Left atrial size was normal in size. Right Atrium: Right atrial size was normal in size. Pericardium: There is no evidence of pericardial effusion. Mitral Valve: The mitral valve is degenerative in appearance. There is moderate thickening of the mitral valve leaflet(s). There is moderate calcification of the mitral valve leaflet(s). Moderate mitral annular calcification. Mild mitral valve regurgitation. No evidence of mitral valve stenosis. MV peak gradient, 5.5 mmHg. The mean mitral valve gradient is 2.0 mmHg. Tricuspid Valve: The tricuspid valve is normal in structure. Tricuspid valve regurgitation is mild . No evidence of tricuspid stenosis. Aortic Valve: The aortic valve is calcified. There is severe calcifcation of the aortic valve. There is severe thickening of the aortic valve. Aortic valve regurgitation is not visualized. Severe aortic stenosis is present. Aortic valve mean gradient measures 18.0 mmHg. Aortic valve peak gradient measures  36.5 mmHg. Aortic valve area, by VTI  measures 0.46 cm. Pulmonic Valve: The pulmonic valve was normal in structure. Pulmonic valve regurgitation is trivial. No evidence of pulmonic stenosis. Aorta: The aortic root is normal in size and structure. Venous: The inferior vena cava is normal in size with greater than 50% respiratory variability, suggesting right atrial pressure of 3 mmHg. IAS/Shunts: No atrial level shunt detected by color flow Doppler.  LEFT VENTRICLE PLAX 2D LVIDd:         4.00 cm LVIDs:         2.80 cm LV PW:         0.90 cm LV IVS:        1.00 cm LVOT diam:     1.80 cm LV SV:         23 LV SV Index:   15 LVOT Area:     2.54 cm  RIGHT VENTRICLE             IVC RV Basal diam:  2.80 cm     IVC diam: 1.70 cm RV S prime:     15.70 cm/s TAPSE (M-mode): 2.5 cm LEFT ATRIUM             Index        RIGHT ATRIUM          Index LA diam:        3.80 cm 2.41 cm/m   RA Area:     6.82 cm LA Vol (A2C):   30.3 ml 19.24 ml/m  RA Volume:   14.40 ml 9.15 ml/m LA Vol (A4C):   46.3 ml 29.40 ml/m LA Biplane Vol: 38.6 ml 24.51 ml/m  AORTIC VALVE                     PULMONIC VALVE AV Area (Vmax):    0.53 cm      PV Vmax:          1.01 m/s AV Area (Vmean):   0.53 cm      PV Peak grad:     4.1 mmHg AV Area (VTI):     0.46 cm      PR End Diast Vel: 6.55 msec AV Vmax:           302.00 cm/s AV Vmean:          193.000 cm/s AV VTI:            0.501 m AV Peak Grad:      36.5 mmHg AV Mean Grad:      18.0 mmHg LVOT Vmax:         62.50 cm/s LVOT Vmean:        39.900 cm/s LVOT VTI:          0.090 m LVOT/AV VTI ratio: 0.18  AORTA Ao Root diam: 2.50 cm Ao Asc diam:  3.30 cm Ao Arch diam: 2.3 cm MITRAL VALVE             TRICUSPID VALVE MV Area VTI:  1.23 cm   TR Peak grad:   21.3 mmHg MV Peak grad: 5.5 mmHg   TR Vmax:        231.00 cm/s MV Mean grad: 2.0 mmHg MV Vmax:      1.17 m/s   SHUNTS MV Vmean:     70.7 cm/s  Systemic VTI:  0.09 m                          Systemic Diam:  1.80 cm Fransico Him MD Electronically signed by  Fransico Him MD Signature Date/Time: 08/11/2022/9:11:31 AM    Final    NM Pulmonary Perfusion  Result Date: 08/04/2022 CLINICAL DATA:  Pulmonary embolism (PE) suspected, high prob Shortness of breath.  Chest pain. EXAM: NUCLEAR MEDICINE PERFUSION LUNG SCAN TECHNIQUE: Perfusion images were obtained in multiple projections after intravenous injection of radiopharmaceutical. Ventilation scans intentionally deferred if perfusion scan and chest x-ray adequate for interpretation during COVID 19 epidemic. RADIOPHARMACEUTICALS:  4 mCi Tc-31mMAA IV COMPARISON:  Chest radiograph earlier today. FINDINGS: Homogeneous perfusion throughout both lungs. No peripheral or wedge-shaped defects to suggest pulmonary embolus. IMPRESSION: No scintigraphic evidence of pulmonary embolus. Electronically Signed   By: MKeith RakeM.D.   On: 08/08/2022 17:19   DG Knee Complete 4 Views Right  Result Date: 07/21/2022 CLINICAL DATA:  RIGHT knee pain EXAM: RIGHT KNEE - COMPLETE 4+ VIEW COMPARISON:  None Available. FINDINGS: Osseous alignment is within normal limits. No fracture line or displaced fracture fragment. No acute-appearing cortical irregularity or osseous lesion. Tricompartmental degenerative osteoarthritic changes, mild to moderate in degree with joint space narrowing and mild osseous spurring. Faint calcifications within the medial and lateral compartments, compatible with associated chondrocalcinosis. Probable small joint effusion within the suprapatellar bursa. Extensive atherosclerosis of the superficial femoral artery. Superficial soft tissues about the RIGHT knee are otherwise unremarkable. IMPRESSION: 1. No acute-appearing osseous abnormality. 2. Probable small joint effusion. 3. Tricompartmental degenerative osteoarthritic changes, mild to moderate in degree. 4. Chondrocalcinosis within the medial and lateral compartments, compatible with underlying CPPD. 5. Extensive atherosclerosis of the superficial femoral  artery. Electronically Signed   By: SFranki CabotM.D.   On: 08/07/2022 10:41   DG Foot Complete Right  Result Date: 07/29/2022 CLINICAL DATA:  RIGHT foot pain EXAM: RIGHT FOOT COMPLETE - 3+ VIEW COMPARISON:  None Available. FINDINGS: No acute-appearing osseous abnormality. No fracture line or displaced fracture fragment is seen, although osteopenia limits characterization of osseous detail. Soft tissues about the RIGHT w foot are unremarkable. Degenerative osteoarthritic changes at the first MTP joint, at least moderate in degree with joint space loss and articular surface sclerosis. Milder degenerative changes are seen at the TMT, interphalangeal joint spaces and midfoot. IMPRESSION: 1. No acute findings. 2. Degenerative osteoarthritic changes, as detailed above. 3. Osteopenia. Electronically Signed   By: SFranki CabotM.D.   On: 08/13/2022 10:39   DG Chest Port 1 View  Result Date: 08/05/2022 CLINICAL DATA:  Chest pain and wheezing since yesterday. EXAM: PORTABLE CHEST 1 VIEW COMPARISON:  Chest x-rays dated 05/17/2021 and 02/05/2020 FINDINGS: Coarse lung markings are seen bilaterally. Increased interstitial markings are seen throughout both lungs. More confluent opacity at the LEFT lung base. Probable small LEFT pleural effusion. No pneumothorax is seen. Heart size and mediastinal contours are stable. IMPRESSION: 1. Increased interstitial markings throughout both lungs, most likely pulmonary edema and/or atypical pneumonia superimposed on chronic interstitial lung disease, alternatively Nolin Grell rapidly worsening chronic interstitial lung disease. 2. More confluent opacity at the LEFT lung base, atelectasis versus pneumonia. 3. Probable small LEFT pleural effusion. Electronically Signed   By: SFranki CabotM.D.   On: 08/02/2022 10:36        Scheduled Meds:  aspirin EC  81 mg Oral Daily   famotidine  20 mg Oral Daily   furosemide  60 mg Intravenous BID   isosorbide mononitrate  30 mg Oral Daily    lacosamide  50 mg Oral BID   losartan  100 mg Oral  Daily   metoprolol succinate  25 mg Oral Daily   potassium chloride  40 mEq Oral Once   [START ON 07/30/2022] rosuvastatin  20 mg Oral Daily   Continuous Infusions:  heparin 900 Units/hr (08/11/22 0107)     LOS: 1 day    Time spent: over 30 min    Fayrene Helper, MD [facility]   To contact the attending provider between 7A-7P or the covering provider during after hours 7P-7A, please log into the web site [email or web address] and access using universal [place] password for that web site. If you do not have the password, please call the hospital operator.  08/11/2022, 10:02 AM

## 2022-08-12 NOTE — Progress Notes (Signed)
Persistent intermittent chest pain. EKG completed, consistent with other ekgs already reviewed by MD : bp 121/70, pulse 80. SpO2 100 on 4 liters. Denies nausea, diaphoresis. Discussed with MD on call , order for morphine administered

## 2022-08-12 NOTE — Progress Notes (Signed)
ANTICOAGULATION CONSULT NOTE  Pharmacy Consult for heparin Indication:  NSTEMI   Patient Measurements: Height: '5\' 4"'$  (162.6 cm) Weight: 52.2 kg (115 lb 1.3 oz) IBW/kg (Calculated) : 54.7 Heparin Dosing Weight: TBW  Vital Signs: Temp: 97.8 F (36.6 C) (02/27 1440) Temp Source: Oral (02/27 1440) BP: 126/57 (02/27 1440) Pulse Rate: 0 (02/27 1444)  Labs: Recent Labs    07/22/2022 0900 08/11/2022 1056 07/21/2022 1616 08/07/2022 1743 07/21/2022 1933 08/11/22 0541 08/04/2022 0213 07/22/2022 1209  HGB 12.3  --   --   --   --  11.0* 10.9*  --   HCT 37.1  --   --   --   --  32.5* 32.7*  --   PLT 240  --   --   --   --  257 264  --   LABPROT 23.9*  --   --   --   --  26.7* 22.2* 20.6*  INR 2.2*  --   --   --   --  2.5* 2.0* 1.8*  HEPARINUNFRC  --   --   --   --  0.21* 0.54  --   --   CREATININE 0.99  --   --   --   --  1.08* 1.23*  --   TROPONINIHS 199* 189* 241* 250*  --   --   --   --      Estimated Creatinine Clearance: 27.1 mL/min (A) (by C-G formula based on SCr of 1.23 mg/dL (H)).  Assessment: 77 YOF presenting with wheezing and CP, hx of VTE on warfarin PTA, INR therapeutic on admission at 2.2 however recently subtherapeutic, and concern for PE with VQ scan negative. Plans are for cardiac cath when volume status is improved. Pharmacy consulted to start heparin and plans were to start when INR < 2.0  Heparin started this am, INR precath 1.8. Heparin to resume in 8h for ongoing TAVR evaluation.  Goal of Therapy:  Heparin level 0.3-0.7 units/ml Monitor platelets by anticoagulation protocol: Yes   Plan:  -Resume heparin 900 units/h no bolus at 2230 -Check heparin level with am labs  Arrie Senate, PharmD, BCPS, [facility] Clinical Pharmacist [phone] Please check AMION for all [place] numbers 07/28/2022

## 2022-08-12 NOTE — Progress Notes (Signed)
Rounding Note    Patient Name: Tracy Peters Date of Encounter: 07/19/2022  [place] Cardiologist: Werner Lean, MD   Subjective   Had episode of severe chest discomfort, lightheadedness, diaphoresis when ambulating to the bathroom. Symptoms resolved with sitting and resting. ECG unchanged with NSR, LVH with strain, septal q-waves  Currently, states she has chest pain with deep breathing and has dyspnea with minimal exertion. No lightheadedness.   Inpatient Medications    Scheduled Meds:  aspirin EC  81 mg Oral Daily   famotidine  20 mg Oral Daily   lacosamide  50 mg Oral BID   losartan  100 mg Oral Daily   metoprolol succinate  25 mg Oral Daily   rosuvastatin  20 mg Oral Daily   Continuous Infusions:  heparin     PRN Meds: acetaminophen, ondansetron (ZOFRAN) IV, traMADol, zolpidem   Vital Signs    Vitals:   07/31/2022 0319 07/21/2022 0321 08/03/2022 0700 07/22/2022 0802  BP:  114/68 120/74 119/64  Pulse:   77 82  Resp:  16  16  Temp:  (!) 97.3 F (36.3 C)  97.9 F (36.6 C)  TempSrc:  Axillary  Oral  SpO2:   100% 100%  Weight: 52.2 kg     Height:        Intake/Output Summary (Last 24 hours) at 08/08/2022 0807 Last data filed at 08/11/2022 2300 Gross per 24 hour  Intake --  Output 400 ml  Net -400 ml       08/01/2022    3:19 AM 08/09/2022    2:32 PM 06/23/2022    3:21 PM  Last 3 Weights  Weight (lbs) 115 lb 1.3 oz 120 lb 123 lb  Weight (kg) 52.2 kg 54.432 kg 55.792 kg      Telemetry    NSR/ST - Personally Reviewed  ECG    NSR, LVH, septal infarct pattern - Personally Reviewed  Physical Exam   GEN: Sitting in bed, comfortable, NAD  Neck: No significant JVD Cardiac: RRR, 3/6 harsh systolic murmur Respiratory: Diminished at the bases. Otherwise clear GI: Soft, nontender, non-distended  MS: No edema, warm Neuro:  Nonfocal  Psych: Normal affect   Labs    High Sensitivity Troponin:   Recent Labs  Lab 08/02/2022 0900  07/27/2022 1056 07/27/2022 1616 07/26/2022 1743  TROPONINIHS 199* 189* 241* 250*      Chemistry Recent Labs  Lab 08/09/2022 0900 07/29/2022 1056 08/11/22 0541 08/09/2022 0213  NA 135  --  135 136  K 3.9  --  3.6 3.6  CL 101  --  100 99  CO2 21*  --  24 26  GLUCOSE 94  --  100* 107*  BUN 15  --  15 20  CREATININE 0.99  --  1.08* 1.23*  CALCIUM 9.4  --  8.5* 8.7*  MG  --  1.8  --  1.7  PROT  --   --   --  6.0*  ALBUMIN  --   --   --  3.0*  AST  --   --   --  18  ALT  --   --   --  11  ALKPHOS  --   --   --  39  BILITOT  --   --   --  0.5  GFRNONAA 56*  --  50* 43*  ANIONGAP 13  --  11 11     Lipids  Recent Labs  Lab 08/11/22 0541  CHOL  165  TRIG 65  HDL 48  LDLCALC 104*  CHOLHDL 3.4    Hematology Recent Labs  Lab 07/25/2022 0900 08/11/22 0541 08/13/2022 0213  WBC 7.1 7.3 8.2  RBC 3.73* 3.28* 3.26*  HGB 12.3 11.0* 10.9*  HCT 37.1 32.5* 32.7*  MCV 99.5 99.1 100.3*  MCH 33.0 33.5 33.4  MCHC 33.2 33.8 33.3  RDW 13.7 13.5 13.2  PLT 240 257 264    Thyroid No results for input(s): "TSH", "FREET4" in the last 168 hours.  BNP Recent Labs  Lab 07/30/2022 0900  BNP 1,585.6*     DDimer  Recent Labs  Lab 08/05/2022 0900  DDIMER 3.00*      Radiology    ECHOCARDIOGRAM COMPLETE  Result Date: 08/11/2022    ECHOCARDIOGRAM REPORT   Patient Name:   CLOA BERTZ [facility] Date of Exam: 08/11/2022 Medical Rec #:  [id number]              Height:       64.0 in Accession #:    [id number]             Weight:       120.0 lb Date of Birth:  May 13, 1934               BSA:          1.575 m Patient Age:    89 years               BP:           118/68 mmHg Patient Gender: F                      HR:           97 bpm. Exam Location:  Inpatient Procedure: 2D Echo, Cardiac Doppler and Color Doppler Indications:    [id number] Acute diastolic (congestive) heart failure  History:        Patient has no prior history of Echocardiogram examinations.                 CAD, Signs/Symptoms:Shortness of Breath;  Risk                 Factors:Non-Smoker, Hypertension and Dyslipidemia.  Sonographer:    Wilkie Aye RVT RCS Referring Phys: [place]  Sonographer Comments: Suboptimal parasternal window. IMPRESSIONS  1. Left ventricular ejection fraction, by estimation, is 55 to 60%. The left ventricle has normal function. The left ventricle has no regional wall motion abnormalities. Left ventricular diastolic function could not be evaluated. There is severe hypokinesis of the left ventricular, basal-mid inferoseptal wall and inferior wall.  2. Right ventricular systolic function is normal. The right ventricular size is normal. There is normal pulmonary artery systolic pressure. The estimated right ventricular systolic pressure is Q000111Q mmHg.  3. The mitral valve is degenerative. Mild mitral valve regurgitation. No evidence of mitral stenosis. Moderate mitral annular calcification.  4. The aortic valve is calcified. There is severe calcifcation of the aortic valve. There is severe thickening of the aortic valve. Aortic valve regurgitation is not visualized. Severe aortic valve stenosis. Aortic valve area, by VTI measures 0.46 cm. Aortic valve mean gradient measures 18.0 mmHg. Aortic valve Vmax measures 3.02 m/s. Although the mean AVG is mild Vmax is not in the severe range, the SVI is low at 15 and DVI 0.18. These findings are consistent with paradoxical low flow low gradient severe AS.  5. The inferior vena cava is normal  in size with greater than 50% respiratory variability, suggesting right atrial pressure of 3 mmHg. FINDINGS  Left Ventricle: Left ventricular ejection fraction, by estimation, is 55 to 60%. The left ventricle has normal function. The left ventricle has no regional wall motion abnormalities. Severe hypokinesis of the left ventricular, basal-mid inferoseptal wall and inferior wall. The left ventricular internal cavity size was normal in size. There is no left ventricular hypertrophy. Left  ventricular diastolic function could not be evaluated. Right Ventricle: The right ventricular size is normal. No increase in right ventricular wall thickness. Right ventricular systolic function is normal. There is normal pulmonary artery systolic pressure. The tricuspid regurgitant velocity is 2.31 m/s, and  with an assumed right atrial pressure of 3 mmHg, the estimated right ventricular systolic pressure is Q000111Q mmHg. Left Atrium: Left atrial size was normal in size. Right Atrium: Right atrial size was normal in size. Pericardium: There is no evidence of pericardial effusion. Mitral Valve: The mitral valve is degenerative in appearance. There is moderate thickening of the mitral valve leaflet(s). There is moderate calcification of the mitral valve leaflet(s). Moderate mitral annular calcification. Mild mitral valve regurgitation. No evidence of mitral valve stenosis. MV peak gradient, 5.5 mmHg. The mean mitral valve gradient is 2.0 mmHg. Tricuspid Valve: The tricuspid valve is normal in structure. Tricuspid valve regurgitation is mild . No evidence of tricuspid stenosis. Aortic Valve: The aortic valve is calcified. There is severe calcifcation of the aortic valve. There is severe thickening of the aortic valve. Aortic valve regurgitation is not visualized. Severe aortic stenosis is present. Aortic valve mean gradient measures 18.0 mmHg. Aortic valve peak gradient measures 36.5 mmHg. Aortic valve area, by VTI measures 0.46 cm. Pulmonic Valve: The pulmonic valve was normal in structure. Pulmonic valve regurgitation is trivial. No evidence of pulmonic stenosis. Aorta: The aortic root is normal in size and structure. Venous: The inferior vena cava is normal in size with greater than 50% respiratory variability, suggesting right atrial pressure of 3 mmHg. IAS/Shunts: No atrial level shunt detected by color flow Doppler.  LEFT VENTRICLE PLAX 2D LVIDd:         4.00 cm LVIDs:         2.80 cm LV PW:         0.90 cm LV  IVS:        1.00 cm LVOT diam:     1.80 cm LV SV:         23 LV SV Index:   15 LVOT Area:     2.54 cm  RIGHT VENTRICLE             IVC RV Basal diam:  2.80 cm     IVC diam: 1.70 cm RV S prime:     15.70 cm/s TAPSE (M-mode): 2.5 cm LEFT ATRIUM             Index        RIGHT ATRIUM          Index LA diam:        3.80 cm 2.41 cm/m   RA Area:     6.82 cm LA Vol (A2C):   30.3 ml 19.24 ml/m  RA Volume:   14.40 ml 9.15 ml/m LA Vol (A4C):   46.3 ml 29.40 ml/m LA Biplane Vol: 38.6 ml 24.51 ml/m  AORTIC VALVE                     PULMONIC VALVE AV Area (Vmax):  0.53 cm      PV Vmax:          1.01 m/s AV Area (Vmean):   0.53 cm      PV Peak grad:     4.1 mmHg AV Area (VTI):     0.46 cm      PR End Diast Vel: 6.55 msec AV Vmax:           302.00 cm/s AV Vmean:          193.000 cm/s AV VTI:            0.501 m AV Peak Grad:      36.5 mmHg AV Mean Grad:      18.0 mmHg LVOT Vmax:         62.50 cm/s LVOT Vmean:        39.900 cm/s LVOT VTI:          0.090 m LVOT/AV VTI ratio: 0.18  AORTA Ao Root diam: 2.50 cm Ao Asc diam:  3.30 cm Ao Arch diam: 2.3 cm MITRAL VALVE             TRICUSPID VALVE MV Area VTI:  1.23 cm   TR Peak grad:   21.3 mmHg MV Peak grad: 5.5 mmHg   TR Vmax:        231.00 cm/s MV Mean grad: 2.0 mmHg MV Vmax:      1.17 m/s   SHUNTS MV Vmean:     70.7 cm/s  Systemic VTI:  0.09 m                          Systemic Diam: 1.80 cm Fransico Him MD Electronically signed by Fransico Him MD Signature Date/Time: 08/11/2022/9:11:31 AM    Final    NM Pulmonary Perfusion  Result Date: 07/18/2022 CLINICAL DATA:  Pulmonary embolism (PE) suspected, high prob Shortness of breath.  Chest pain. EXAM: NUCLEAR MEDICINE PERFUSION LUNG SCAN TECHNIQUE: Perfusion images were obtained in multiple projections after intravenous injection of radiopharmaceutical. Ventilation scans intentionally deferred if perfusion scan and chest x-ray adequate for interpretation during COVID 19 epidemic. RADIOPHARMACEUTICALS:  4 mCi Tc-4mMAA IV  COMPARISON:  Chest radiograph earlier today. FINDINGS: Homogeneous perfusion throughout both lungs. No peripheral or wedge-shaped defects to suggest pulmonary embolus. IMPRESSION: No scintigraphic evidence of pulmonary embolus. Electronically Signed   By: MKeith RakeM.D.   On: 07/23/2022 17:19   DG Knee Complete 4 Views Right  Result Date: 07/20/2022 CLINICAL DATA:  RIGHT knee pain EXAM: RIGHT KNEE - COMPLETE 4+ VIEW COMPARISON:  None Available. FINDINGS: Osseous alignment is within normal limits. No fracture line or displaced fracture fragment. No acute-appearing cortical irregularity or osseous lesion. Tricompartmental degenerative osteoarthritic changes, mild to moderate in degree with joint space narrowing and mild osseous spurring. Faint calcifications within the medial and lateral compartments, compatible with associated chondrocalcinosis. Probable small joint effusion within the suprapatellar bursa. Extensive atherosclerosis of the superficial femoral artery. Superficial soft tissues about the RIGHT knee are otherwise unremarkable. IMPRESSION: 1. No acute-appearing osseous abnormality. 2. Probable small joint effusion. 3. Tricompartmental degenerative osteoarthritic changes, mild to moderate in degree. 4. Chondrocalcinosis within the medial and lateral compartments, compatible with underlying CPPD. 5. Extensive atherosclerosis of the superficial femoral artery. Electronically Signed   By: SFranki CabotM.D.   On: 08/01/2022 10:41   DG Foot Complete Right  Result Date: 08/06/2022 CLINICAL DATA:  RIGHT foot pain EXAM: RIGHT FOOT COMPLETE - 3+ VIEW  COMPARISON:  None Available. FINDINGS: No acute-appearing osseous abnormality. No fracture line or displaced fracture fragment is seen, although osteopenia limits characterization of osseous detail. Soft tissues about the RIGHT w foot are unremarkable. Degenerative osteoarthritic changes at the first MTP joint, at least moderate in degree with joint  space loss and articular surface sclerosis. Milder degenerative changes are seen at the TMT, interphalangeal joint spaces and midfoot. IMPRESSION: 1. No acute findings. 2. Degenerative osteoarthritic changes, as detailed above. 3. Osteopenia. Electronically Signed   By: Franki Cabot M.D.   On: 08/13/2022 10:39   DG Chest Port 1 View  Result Date: 08/02/2022 CLINICAL DATA:  Chest pain and wheezing since yesterday. EXAM: PORTABLE CHEST 1 VIEW COMPARISON:  Chest x-rays dated 05/17/2021 and 02/05/2020 FINDINGS: Coarse lung markings are seen bilaterally. Increased interstitial markings are seen throughout both lungs. More confluent opacity at the LEFT lung base. Probable small LEFT pleural effusion. No pneumothorax is seen. Heart size and mediastinal contours are stable. IMPRESSION: 1. Increased interstitial markings throughout both lungs, most likely pulmonary edema and/or atypical pneumonia superimposed on chronic interstitial lung disease, alternatively a rapidly worsening chronic interstitial lung disease. 2. More confluent opacity at the LEFT lung base, atelectasis versus pneumonia. 3. Probable small LEFT pleural effusion. Electronically Signed   By: Franki Cabot M.D.   On: 08/03/2022 10:36    Cardiac Studies   TTE 08/11/22: IMPRESSIONS     1. Left ventricular ejection fraction, by estimation, is 55 to 60%. The  left ventricle has normal function. The left ventricle has no regional  wall motion abnormalities. Left ventricular diastolic function could not  be evaluated. There is severe  hypokinesis of the left ventricular, basal-mid inferoseptal wall and  inferior wall.   2. Right ventricular systolic function is normal. The right ventricular  size is normal. There is normal pulmonary artery systolic pressure. The  estimated right ventricular systolic pressure is Q000111Q mmHg.   3. The mitral valve is degenerative. Mild mitral valve regurgitation. No  evidence of mitral stenosis. Moderate mitral  annular calcification.   4. The aortic valve is calcified. There is severe calcifcation of the  aortic valve. There is severe thickening of the aortic valve. Aortic valve  regurgitation is not visualized. Severe aortic valve stenosis. Aortic  valve area, by VTI measures 0.46 cm.  Aortic valve mean gradient measures 18.0 mmHg. Aortic valve Vmax measures  3.02 m/s. Although the mean AVG is mild Vmax is not in the severe range,  the SVI is low at 15 and DVI 0.18. These findings are consistent with  paradoxical low flow low gradient  severe AS.   5. The inferior vena cava is normal in size with greater than 50%  respiratory variability, suggesting right atrial pressure of 3 mmHg.   Patient Profile     89 y.o. female with history of HTN, HLD, PAD, prior DVT and mild carotid disease who presented with worsening chest pain and SOB found to have acute heart failure exacerbation, NSTEMI and suspected AS for which Cardiology was consulted.   Assessment & Plan    #Acute Diastolic HF: Patient presented with worsening SOB and chest pain found to have [id number]. CXR with pulmonary edema. TTE today with EF 50% with basal to mid septal and inferior wall hypokinesis and severe, parardoxical low flow, low gradient aortic stenosis. Volume status significantly improved. Will hold IV diuretics today and give gentle fluid pre-cath. If INR <2, will plan for cath today. -Hold diuretics -Plan for RHC/LHC today  vs tomorrow pending INR (repeat INR planned at 12pm)  #NSTEMI: Trop elevated to 250. Suspect demand in the setting of HF and AS, but wall motion on TTE concerning for underlying CAD as well. Will continue ASA and start heparin gtt given INR 2. Plan for cath once INR<2. -Plan for RHC/LHC today vs tomorrow pending INR -Plan for heparin gtt no bolus given INR 2 -Continue ASA '81mg'$  daily -Continue metop '25mg'$  XL daily -Hold ARB in prep for cath  #Severe Paradoxical AS: TTE with mean gradient 39mHg, with Vmax  3.0 but with AVA 0.5cm2, DI 0.18 with low SVI consistent with severe, paradoxical AS. Visually, the valve appears severely stenotic. Suspect this is the primary driver of acute decompensation. Will plan for LHC/RHC and TAVR work-up as above. -Plan for LHC/RHC and TAVR work-up -Structural Heart Team consulted  #HTN: -Continue metop '25mg'$  XL daily -Hold losartan in prep for cath  #HLD: -Continue crestor '20mg'$  daily (dose increased this admission for LDL 104) -Repeat lipids in 8 weeks  #History of DVT on Warfarin: -Hold warfarin as will need cath; plan to start heparin -Repeat INR this afternoon  INFORMED CONSENT: I have reviewed the risks, indications, and alternatives to cardiac catheterization, possible angioplasty, and stenting with the patient. Risks include but are not limited to bleeding, infection, vascular injury, stroke, myocardial infection, arrhythmia, kidney injury, radiation-related injury in the case of prolonged fluoroscopy use, emergency cardiac surgery, and death. The patient understands the risks of serious complication is 1-2 in [id number] diagnostic cardiac cath and 1-2% or less with angioplasty/stenting.       For questions or updates, please contact CColstripPlease consult [email or web address] for contact info under        Signed, HFreada Bergeron MD  07/25/2022, 8:07 AM

## 2022-08-12 NOTE — Interval H&P Note (Signed)
History and Physical Interval Note:  07/18/2022 1:45 PM  Tracy Peters  has presented today for surgery, with the diagnosis of aortic stenosis.  The various methods of treatment have been discussed with the patient and family. After consideration of risks, benefits and other options for treatment, the patient has consented to  Procedure(s): RIGHT/LEFT HEART CATH AND CORONARY ANGIOGRAPHY (N/A) as a surgical intervention.  The patient's history has been reviewed, patient examined, no change in status, stable for surgery.  I have reviewed the patient's chart and labs.  Questions were answered to the patient's satisfaction.     Sherren Mocha

## 2022-08-12 NOTE — Progress Notes (Addendum)
ANTICOAGULATION CONSULT NOTE  Pharmacy Consult for heparin Indication:  NSTEMI   Patient Measurements: Height: '5\' 4"'$  (162.6 cm) Weight: 52.2 kg (115 lb 1.3 oz) IBW/kg (Calculated) : 54.7 Heparin Dosing Weight: TBW  Vital Signs: Temp: 97.3 F (36.3 C) (02/27 0321) Temp Source: Axillary (02/27 0321) BP: 120/74 (02/27 0700) Pulse Rate: 77 (02/27 0700)  Labs: Recent Labs    07/20/2022 0900 07/23/2022 1056 07/26/2022 1616 08/13/2022 1743 07/17/2022 1933 08/11/22 0541 08/07/2022 0213  HGB 12.3  --   --   --   --  11.0* 10.9*  HCT 37.1  --   --   --   --  32.5* 32.7*  PLT 240  --   --   --   --  257 264  LABPROT 23.9*  --   --   --   --  26.7* 22.2*  INR 2.2*  --   --   --   --  2.5* 2.0*  HEPARINUNFRC  --   --   --   --  0.21* 0.54  --   CREATININE 0.99  --   --   --   --  1.08* 1.23*  TROPONINIHS 199* 189* 241* 250*  --   --   --      Estimated Creatinine Clearance: 27.1 mL/min (A) (by C-G formula based on SCr of 1.23 mg/dL (H)).  Assessment: 26 YOF presenting with wheezing and CP, hx of VTE on warfarin PTA, INR therapeutic on admission at 2.2 however recently subtherapeutic, and concern for PE with VQ scan negative. Plans are for cardiac cath when volume status is improved. Pharmacy consulted to start heparin and plans were to start when INR < 2.0 -INR =2 (last Coumadin dose PTA 2/24).  -CBC stable -heparin was previously at goal on 900 units/hr  Goal of Therapy:  Heparin level 0.3-0.7 units/ml Monitor platelets by anticoagulation protocol: Yes   Plan:  -With INR= 2.0 will start heparin at 900 units/hr -Heparin level in 8 hours and daily wth CBC daily  Hildred Laser, PharmD Clinical Pharmacist **Pharmacist phone directory can now be found on [email or web address] ([email or web address]).  Listed under [place].

## 2022-08-13 ENCOUNTER — Encounter (HOSPITAL_COMMUNITY): Payer: Self-pay | Admitting: Cardiovascular Disease

## 2022-08-13 DIAGNOSIS — I10 Essential (primary) hypertension: Secondary | ICD-10-CM | POA: Diagnosis not present

## 2022-08-13 DIAGNOSIS — I5031 Acute diastolic (congestive) heart failure: Secondary | ICD-10-CM | POA: Diagnosis not present

## 2022-08-13 DIAGNOSIS — I350 Nonrheumatic aortic (valve) stenosis: Secondary | ICD-10-CM | POA: Diagnosis not present

## 2022-08-13 DIAGNOSIS — I214 Non-ST elevation (NSTEMI) myocardial infarction: Secondary | ICD-10-CM | POA: Diagnosis not present

## 2022-08-13 DIAGNOSIS — I509 Heart failure, unspecified: Secondary | ICD-10-CM | POA: Diagnosis not present

## 2022-08-13 LAB — BASIC METABOLIC PANEL
Anion gap: 12 (ref 5–15)
BUN: 23 mg/dL (ref 8–23)
CO2: 23 mmol/L (ref 22–32)
Calcium: 8.8 mg/dL — ABNORMAL LOW (ref 8.9–10.3)
Chloride: 103 mmol/L (ref 98–111)
Creatinine, Ser: 0.89 mg/dL (ref 0.44–1.00)
GFR, Estimated: >60 mL/min (ref >60)
Glucose, Bld: 106 mg/dL — ABNORMAL HIGH (ref 70–99)
Potassium: 3.8 mmol/L (ref 3.5–5.1)
Sodium: 138 mmol/L (ref 135–145)

## 2022-08-13 LAB — CBC
HCT: 33.7 % — ABNORMAL LOW (ref 36.0–46.0)
Hemoglobin: 11.5 g/dL — ABNORMAL LOW (ref 12.0–15.0)
MCH: 33.7 pg (ref 26.0–34.0)
MCHC: 34.1 g/dL (ref 30.0–36.0)
MCV: 98.8 fL (ref 80.0–100.0)
Platelets: 276 10*3/uL (ref 150–400)
RBC: 3.41 MIL/uL — ABNORMAL LOW (ref 3.87–5.11)
RDW: 13 % (ref 11.5–15.5)
WBC: 4.1 10*3/uL (ref 4.0–10.5)
nRBC: 0 % (ref 0.0–0.2)

## 2022-08-13 LAB — PROTIME-INR
INR: 1.4 — ABNORMAL HIGH (ref 0.8–1.2)
Prothrombin Time: 17.1 seconds — ABNORMAL HIGH (ref 11.4–15.2)

## 2022-08-13 LAB — HEMOGLOBIN A1C
Hgb A1c MFr Bld: 5.2 % (ref 4.8–5.6)
Mean Plasma Glucose: 103 mg/dL

## 2022-08-13 LAB — HEPARIN LEVEL (UNFRACTIONATED)
Heparin Unfractionated: 0.24 IU/mL — ABNORMAL LOW (ref 0.30–0.70)
Heparin Unfractionated: 0.97 IU/mL — ABNORMAL HIGH (ref 0.30–0.70)

## 2022-08-13 MED ORDER — PREDNISONE 20 MG PO TABS
50.0000 mg | ORAL_TABLET | Freq: Four times a day (QID) | ORAL | Status: AC
  Administered 2022-08-13 – 2022-08-14 (×3): 50 mg via ORAL
  Filled 2022-08-13 (×3): qty 1

## 2022-08-13 MED ORDER — DIPHENHYDRAMINE HCL 25 MG PO CAPS
50.0000 mg | ORAL_CAPSULE | Freq: Once | ORAL | Status: AC
  Administered 2022-08-14: 50 mg via ORAL
  Filled 2022-08-13: qty 2

## 2022-08-13 NOTE — Evaluation (Signed)
Physical Therapy Evaluation Patient Details Name: Tracy Peters MRN: [id number] DOB: 1934-05-09 Today's Date: 08/13/2022  History of Present Illness  Tracy Peters is a 89 y.o. female past medical history of DVT on Coumadin, peripheral vascular disease & hypertension who presented to the ED on 2/25 for shortness of breath x 2 weeks, progressively worsening as well as 2 days of chest pain at rest.  Found to have NSTEMI and severe Aortic Stenosis.  Work up underway for [employer].  Clinical Impression  Patient presents with decreased activity tolerance and decreased cardiorespiratory endurance.  She was able to ambulate in hallway for pre-TAVR assessment, however, became symptomatic with hallway ambulation with decreased responsiveness as documented below and in TAVR note.  PT will follow up after procedure to progress mobility when she is able to tolerate.        Recommendations for follow up therapy are one component of a multi-disciplinary discharge planning process, led by the attending physician.  Recommendations may be updated based on patient status, additional functional criteria and insurance authorization.  Follow Up Recommendations No PT follow up      Assistance Recommended at Discharge Set up Supervision/Assistance  Patient can return home with the following  A little help with walking and/or transfers;Assistance with cooking/housework;Assist for transportation;Help with stairs or ramp for entrance;A little help with bathing/dressing/bathroom    Equipment Recommendations None recommended by PT  Recommendations for Other Services       Functional Status Assessment Patient has had a recent decline in their functional status and demonstrates the ability to make significant improvements in function in a reasonable and predictable amount of time.     Precautions / Restrictions Precautions Precautions: Fall Precaution Comments: watch HR, BP      Mobility  Bed  Mobility Overal bed mobility: Modified Independent                  Transfers Overall transfer level: Modified independent Equipment used: Rolling walker (2 wheels)                    Ambulation/Gait Ambulation/Gait assistance: Min guard Gait Distance (Feet): 340 Feet Assistive device: Rolling walker (2 wheels) Gait Pattern/deviations: Step-through pattern, Decreased stride length, Shuffle       General Gait Details: ambulating well with minguard only for safety, but pt became SOB and standing to rest, then felt dizzy so assisted to chair with legs elevated and pt not responding, transferred to bed with pt able to assist HR 133, then became less responsive again HR 41 RN in to assist and pt roused and with dry heaves BP 160/90, HR 95, SpO2 100% on 4L O2  Stairs            Wheelchair Mobility    Modified Rankin (Stroke Patients Only)       Balance Overall balance assessment: Needs assistance   Sitting balance-Leahy Scale: Good       Standing balance-Leahy Scale: Fair Standing balance comment: can stand without UE support, uses RW for ambulation at home                             Pertinent Vitals/Pain Pain Assessment Pain Assessment: 0-10 Pain Score: 6  Pain Location: chest after ambulation Pain Descriptors / Indicators: Aching Pain Intervention(s): Monitored during session, Repositioned, Other (comment) (RN aware)    Home Living Family/patient expects to be discharged to:: Private residence Living Arrangements: Spouse/significant other  Available Help at Discharge: Family Type of Home: House Home Access: Stairs to enter Entrance Stairs-Rails: Left Entrance Stairs-Number of Steps: 4   Home Layout: One level Home Equipment: Grab bars - tub/shower;Shower Land (2 wheels)      Prior Function Prior Level of Function : Independent/Modified Independent                     Hand Dominance   Dominant Hand:  Right    Extremity/Trunk Assessment   Upper Extremity Assessment Upper Extremity Assessment: Generalized weakness    Lower Extremity Assessment Lower Extremity Assessment: Generalized weakness    Cervical / Trunk Assessment Cervical / Trunk Assessment: Kyphotic  Communication   Communication: No difficulties  Cognition Arousal/Alertness: Awake/alert Behavior During Therapy: WFL for tasks assessed/performed Overall Cognitive Status: Within Functional Limits for tasks assessed                                          General Comments General comments (skin integrity, edema, etc.): Family in the room and supportive,  Patient relates a lot of stress lately with her son having cancer surgery and daughter in rehab after prolonged hospital stay.    Exercises     Assessment/Plan    PT Assessment Patient needs continued PT services  PT Problem List Decreased activity tolerance;Cardiopulmonary status limiting activity;Decreased balance       PT Treatment Interventions DME instruction;Therapeutic activities;Patient/family education;Therapeutic exercise;Gait training;Functional mobility training;Balance training    PT Goals (Current goals can be found in the Care Plan section)  Acute Rehab PT Goals Patient Stated Goal: to have procedure for her heart PT Goal Formulation: With patient/family Time For Goal Achievement: 08/27/22 Potential to Achieve Goals: Good    Frequency Min 3X/week     Co-evaluation               AM-PAC PT "6 Clicks" Mobility  Outcome Measure Help needed turning from your back to your side while in a flat bed without using bedrails?: None Help needed moving from lying on your back to sitting on the side of a flat bed without using bedrails?: None Help needed moving to and from a bed to a chair (including a wheelchair)?: None Help needed standing up from a chair using your arms (e.g., wheelchair or bedside chair)?: None Help needed  to walk in hospital room?: A Little Help needed climbing 3-5 steps with a railing? : Total 6 Click Score: 20    End of Session Equipment Utilized During Treatment: Gait belt;Oxygen Activity Tolerance: Treatment limited secondary to medical complications (Comment) (decreased responsiveness, HR elevation, then bradycardia) Patient left: in bed;with call bell/phone within reach;with family/visitor present;with nursing/sitter in room   PT Visit Diagnosis: Muscle weakness (generalized) (M62.81)    Time: [id number] PT Time Calculation (min) (ACUTE ONLY): 35 min   Charges:   PT Evaluation $PT Eval Moderate Complexity: 1 Mod PT Treatments $Gait Training: 8-22 mins        Magda Kiel, PT Acute Rehabilitation Services Office:[phone] 08/13/2022   Reginia Naas 08/13/2022, 5:14 PM

## 2022-08-13 NOTE — Progress Notes (Signed)
  [place] VALVE TEAM   Patient underwent R/LHC yesterday with no significant CAD noted. Plan for pre-TAVR CT scans however given her contrast dye allergy, she will need to be pre-medicated per contrast allergy protocol for a total of 13 hours. Scans will be performed tomorrow, 08/14/22. We will have TCTS consult once CT imaging complete.   Kathyrn Drown NP-C Structural Heart Team  Pager: [phone] Phone: [phone]

## 2022-08-13 NOTE — TOC Initial Note (Addendum)
Transition of Care [facility]) - Initial/Assessment Note    Patient Details  Name: Tracy Peters MRN: [id number] Date of Birth: 1934-04-24  Transition of Care [facility]) CM/SW Contact:    Bethena Roys, RN Phone Number: 08/13/2022, 10:20 AM  Clinical Narrative:   Risk for readmission assessment completed. Patient presented for chest pain and wheezing. Plan for continued TAVR evaluation. PTA patient was from home with the support of spouse. Patient reports that she has a PCP and gets to appointments without issues. Patient has DME rolling walker and shower chair in the home. Case Manager will continue to follow for transition of care needs as the patient progresses.                 Expected Discharge Plan: Home/Self Care Barriers to Discharge: Continued Medical Work up   Patient Goals and CMS Choice Patient states their goals for this hospitalization and ongoing recovery are:: to return home.   Choice offered to / list presented to : NA      Expected Discharge Plan and Services In-house Referral: NA Discharge Planning Services: CM Consult Post Acute Care Choice: NA Living arrangements for the past 2 months: Single Family Home                   DME Agency: NA   Prior Living Arrangements/Services Living arrangements for the past 2 months: Single Family Home Lives with:: Spouse Patient language and need for interpreter reviewed:: Yes Do you feel safe going back to the place where you live?: Yes      Need for Family Participation in Patient Care: Yes (Comment) Care giver support system in place?: Yes (comment) Current home services: DME (Rolling walker and shower chair.) Criminal Activity/Legal Involvement Pertinent to Current Situation/Hospitalization: No - Comment as needed  Activities of Daily Living Home Assistive Devices/Equipment: Cane (specify quad or straight) ADL Screening (condition at time of admission) Patient's cognitive ability adequate to safely  complete daily activities?: Yes Is the patient deaf or have difficulty hearing?: No Does the patient have difficulty seeing, even when wearing glasses/contacts?: No Does the patient have difficulty concentrating, remembering, or making decisions?: No Patient able to express need for assistance with ADLs?: Yes Does the patient have difficulty dressing or bathing?: No Independently performs ADLs?: Yes (appropriate for developmental age) Does the patient have difficulty walking or climbing stairs?: No Weakness of Legs: None Weakness of Arms/Hands: None  Permission Sought/Granted Permission sought to share information with : Family Supports, Case Manager     Emotional Assessment Appearance:: Appears stated age Attitude/Demeanor/Rapport: Engaged Affect (typically observed): Appropriate Orientation: : Oriented to Situation, Oriented to  Time, Oriented to Place, Oriented to Self Alcohol / Substance Use: Not Applicable Psych Involvement: No (comment)  Admission diagnosis:  Elevated troponin [R79.89] NSTEMI (non-ST elevated myocardial infarction) ([place]) [I21.4] Elevated d-dimer [R79.89] Pneumonia due to infectious organism, unspecified laterality, unspecified part of lung [J18.9] Acute congestive heart failure, unspecified heart failure type [facility]) [I50.9] Patient Active Problem List   Diagnosis Date Noted   Severe aortic stenosis 08/09/2022   Elevated troponin I level 07/17/2022   NSTEMI (non-ST elevated myocardial infarction) ([place]) 07/27/2022   Elevated d-dimer 08/08/2022   Elevated troponin 08/01/2022   History of malignant neoplasm of breast 11/06/2021   Hyperlipidemia 11/06/2021   Clavicle pain 11/06/2021   Chest pain, atypical 05/21/2021   Skin cancer 03/19/2021   Falls 09/17/2020   Memory difficulties 02/28/2020   Upper respiratory infection 01/16/2020   Hammer toe 07/26/2019  Metatarsalgia of right foot 07/26/2019   Cellulitis 02/22/2019   Presbycusis of both ears  11/23/2018   Hearing loss 08/26/2018   Ductal carcinoma in situ (DCIS) of left breast 08/03/2018   Anemia 06/29/2018   Genital herpes simplex 06/28/2018   Irritable bowel syndrome 06/28/2018   Kidney disease 06/28/2018   Osteoporosis 06/28/2018   Urinary tract infectious disease 06/28/2018   Pain in joint of right hip 01/25/2018   LFT elevation 12/22/2017   Weakness 12/16/2017   Hyponatremia 12/16/2017   Seizure ([place]) 10/20/2017   Dislocation of internal right hip prosthesis ([place]) 08/23/2017   Anemia, iron deficiency 07/29/2017   S/P right THA, PA 06/15/2017   History of revision of total replacement of right hip joint 06/15/2017   Trochanteric bursitis of right hip 06/01/2017   Preop exam for internal medicine 05/20/2017   Impacted cerumen of right ear 01/27/2017   Actinic keratosis 01/27/2017   Primary hypercoagulable state ([place]) [D68.59] 11/17/2016   Long term (current) use of anticoagulants [Z79.01] 11/17/2016   Osteopenia 07/14/2016   Right-sided chest wall pain 04/17/2016   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   Dysuria 06/15/2015   Lumbar radiculopathy 01/30/2015   Carpal tunnel syndrome 05/19/2014   Cervical radiculitis 12/28/2013   Pain in left foot 12/28/2013   Paronychia 12/28/2013   Cervical spondylosis with radiculopathy 10/25/2013   Degenerative cervical spinal stenosis 10/25/2013   Chronic anticoagulation 03/21/2013   Well adult exam 06/02/2012   Weight loss 10/01/2011   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[place]) 09/16/2011   Urticaria 05/28/2011   DIVERTICULOSIS, COLON 04/30/2010   COLONIC POLYPS, ADENOMATOUS, HX OF 04/30/2010   Dyslipidemia 09/20/2009   VERTIGO 09/20/2009   ALLERGIC RHINITIS 03/27/2009   INSOMNIA, PERSISTENT 09/20/2008   PARESTHESIA 09/20/2008   RASH AND OTHER NONSPECIFIC SKIN ERUPTION 03/22/2008   ACUTE BRONCHITIS 02/22/2008   Nonspecific (abnormal) findings on radiological and other examination of body structure 02/22/2008    CHEST XRAY, ABNORMAL 02/22/2008   GERD 09/21/2007   Essential hypertension 03/23/2007   Chronic neck pain 03/23/2007   DVT, HX OF 03/23/2007   Diarrhea 03/18/2007   PCP:  Cassandria Anger, MD Pharmacy:   [facility], [place] [address] Phone: [phone] Fax: [phone]  Social Determinants of Health (SDOH) Social History: SDOH Screenings   Food Insecurity: No Food Insecurity (08/05/2022)  Housing: Low Risk  (08/02/2022)  Transportation Needs: No Transportation Needs (08/09/2022)  Utilities: Not At Risk (08/05/2022)  Alcohol Screen: Low Risk  (11/04/2021)  Depression (PHQ2-9): Low Risk  (06/23/2022)  Financial Resource Strain: Low Risk  (11/04/2021)  Physical Activity: Insufficiently Active (11/04/2021)  Social Connections: Moderately Integrated (11/04/2021)  Stress: No Stress Concern Present (11/04/2021)  Tobacco Use: Low Risk  (08/13/2022)  Readmission Risk Interventions    08/13/2022   10:18 AM  Readmission Risk Prevention Plan  Transportation Screening Complete  HRI or Home Care Consult Complete  Social Work Consult for [place] Planning/Counseling Complete  Palliative Care Screening Not Applicable  Medication Review [employer]) Referral to Pharmacy

## 2022-08-13 NOTE — Progress Notes (Signed)
   Called to bedside by nurse. Patient was up working with PT , became tachycardiac on telemetry. Per nursing staff, she came back to the room and became minimally responsive, pale. Telemetry shows sinus bradycardia with HR in the 40s. She was diaphoretic and nauseated, very lethargic. Then had episode of SVT with rates in the 150s. Return of sinus rhythm in the 80s currently with BP in the 140s. She is now feeling better with nausea passing. Advised I will contact the structural heart team with an update regarding symptoms as she is in the process of TAVR work up with CT scans pending for tomorrow. Instructed nursing staff to keep patient on bedrest at this time. Family updated regarding plan.   SignedReino Bellis, NP-C 08/13/2022, 5:15 PM Pager: [phone]

## 2022-08-13 NOTE — Progress Notes (Signed)
ANTICOAGULATION CONSULT NOTE  Pharmacy Consult for heparin Indication:  NSTEMI   Patient Measurements: Height: '5\' 4"'$  (162.6 cm) Weight: 55.2 kg (121 lb 11.2 oz) IBW/kg (Calculated) : 54.7 Heparin Dosing Weight: TBW  Vital Signs: Temp: 97.8 F (36.6 C) (02/27 2057) Temp Source: Oral (02/27 2057) BP: 116/53 (02/28 0427) Pulse Rate: 71 (02/28 0427)  Labs: Recent Labs    08/07/2022 0900 07/23/2022 1056 07/26/2022 1616 08/13/2022 1743 07/30/2022 1933 08/11/22 0541 07/23/2022 0213 08/11/2022 1209 08/09/2022 1410 07/21/2022 1411 07/19/2022 1415 08/13/22 0219  HGB 12.3  --   --   --   --  11.0* 10.9*  --    < > 11.9* 11.6* 11.5*  HCT 37.1  --   --   --   --  32.5* 32.7*  --    < > 35.0* 34.0* 33.7*  PLT 240  --   --   --   --  257 264  --   --   --   --  276  LABPROT 23.9*  --   --   --   --  26.7* 22.2* 20.6*  --   --   --  17.1*  INR 2.2*  --   --   --   --  2.5* 2.0* 1.8*  --   --   --  1.4*  HEPARINUNFRC  --   --   --   --  0.21* 0.54  --   --   --   --   --  0.24*  CREATININE 0.99  --   --   --   --  1.08* 1.23*  --   --   --   --   --   TROPONINIHS 199* 189* 241* 250*  --   --   --   --   --   --   --   --    < > = values in this interval not displayed.     Estimated Creatinine Clearance: 28.4 mL/min (A) (by C-G formula based on SCr of 1.23 mg/dL (H)).  Assessment: 69 YOF presenting with wheezing and CP, hx of VTE on warfarin PTA, INR therapeutic on admission at 2.2 however recently subtherapeutic, and concern for PE with VQ scan negative. Plans are for cardiac cath when volume status is improved. Pharmacy consulted to start heparin and plans were to start when INR < 2.0  Now s/p RHC/LHC on heparin for TAVR evaluation -heparin level = 0.24 on 900 units/hr, CBC stable  Goal of Therapy:  Heparin level 0.3-0.7 units/ml Monitor platelets by anticoagulation protocol: Yes   Plan:  -Increase heparin to 1050 units/hr -Heparin level in 8 hours and daily wth CBC daily  Hildred Laser,  PharmD Clinical Pharmacist **Pharmacist phone directory can now be found on [email or web address] ([email or web address]).  Listed under [place].

## 2022-08-13 NOTE — Progress Notes (Addendum)
ANTICOAGULATION CONSULT NOTE  Pharmacy Consult for heparin Indication:  NSTEMI   Patient Measurements: Height: '5\' 4"'$  (162.6 cm) Weight: 55.2 kg (121 lb 11.2 oz) IBW/kg (Calculated) : 54.7 Heparin Dosing Weight: TBW  Vital Signs: Temp: 97.9 F (36.6 C) (02/28 1446) Temp Source: Oral (02/28 1446) BP: 143/95 (02/28 1646) Pulse Rate: 93 (02/28 1646)  Labs: Recent Labs    08/03/2022 1743 07/23/2022 1933 08/11/22 0541 08/06/2022 0213 07/26/2022 1209 07/26/2022 1410 07/20/2022 1411 07/17/2022 1415 08/13/22 0219 08/13/22 0732 08/13/22 1604  HGB  --    < > 11.0* 10.9*  --    < > 11.9* 11.6* 11.5*  --   --   HCT  --    < > 32.5* 32.7*  --    < > 35.0* 34.0* 33.7*  --   --   PLT  --   --  257 264  --   --   --   --  276  --   --   LABPROT  --    < > 26.7* 22.2* 20.6*  --   --   --  17.1*  --   --   INR  --    < > 2.5* 2.0* 1.8*  --   --   --  1.4*  --   --   HEPARINUNFRC  --    < > 0.54  --   --   --   --   --  0.24*  --  0.97*  CREATININE  --   --  1.08* 1.23*  --   --   --   --   --  0.89  --   TROPONINIHS 250*  --   --   --   --   --   --   --   --   --   --    < > = values in this interval not displayed.    Estimated Creatinine Clearance: 39.2 mL/min (by C-G formula based on SCr of 0.89 mg/dL).  Assessment: 52 YOF presenting with wheezing and CP, hx of VTE on warfarin PTA, INR therapeutic on admission at 2.2 however recently subtherapeutic, and concern for PE with VQ scan negative. Plans are for cardiac cath when volume status is improved. Pharmacy consulted to start heparin and plans were to start when INR < 2.0  Now s/p RHC/LHC on heparin for TAVR evaluation. Heparin level now up at 0.97 with minor bleeding from PIV site (controlled with pressure, no further bleeding once heparin switched to other site). Confirmed heparin level was drawn from opposite arm. Will decrease rate and monitor closely.  Goal of Therapy:  Heparin level 0.3-0.7 units/ml Monitor platelets by anticoagulation  protocol: Yes   Plan:  Decrease heparin infusion to 950 units/hr Check heparin level in 8 hours and daily while on heparin Continue to monitor H&H and platelets  Thank you for allowing pharmacy to be a part of this patient's care.  Ardyth Harps, PharmD Clinical Pharmacist

## 2022-08-13 NOTE — Care Management Important Message (Signed)
Important Message  Patient Details  Name: Tracy Peters MRN: [id number] Date of Birth: 14-Dec-1933   Medicare Important Message Given:  Yes     Shelda Altes 08/13/2022, 8:14 AM

## 2022-08-13 NOTE — Progress Notes (Signed)
Rounding Note    Patient Name: Tracy Peters Date of Encounter: 08/13/2022  [place] Cardiologist: Werner Lean, MD   Subjective   She has some pleuritic cp and a cough. No fever. Otherwise, is staying in bed, no significant issues  Inpatient Medications    Scheduled Meds:  aspirin EC  81 mg Oral Daily   [START ON 08/14/2022] diphenhydrAMINE  50 mg Oral Once   famotidine  20 mg Oral Daily   lacosamide  50 mg Oral BID   metoprolol succinate  25 mg Oral Daily   predniSONE  50 mg Oral Q6H   rosuvastatin  20 mg Oral Daily   sodium chloride flush  3 mL Intravenous Q12H   Continuous Infusions:  sodium chloride     heparin 1,050 Units/hr (08/13/22 0756)   PRN Meds: sodium chloride, acetaminophen, ondansetron (ZOFRAN) IV, sodium chloride flush, traMADol, zolpidem   Vital Signs    Vitals:   07/18/2022 1500 08/11/2022 2057 08/13/22 0427 08/13/22 0600  BP: 115/60 111/84 (!) 116/53   Pulse: 92 93 71   Resp: 16 18    Temp:  97.8 F (36.6 C)    TempSrc:  Oral    SpO2: 96% 92% 95%   Weight:    55.2 kg  Height:        Intake/Output Summary (Last 24 hours) at 08/13/2022 0913 Last data filed at 08/13/2022 0400 Gross per 24 hour  Intake 49.96 ml  Output 400 ml  Net -350.04 ml      08/13/2022    6:00 AM 07/25/2022   11:35 AM 08/11/2022    3:19 AM  Last 3 Weights  Weight (lbs) 121 lb 11.2 oz 115 lb 1.3 oz 115 lb 1.3 oz  Weight (kg) 55.203 kg 52.2 kg 52.2 kg      Telemetry    NSR - Personally Reviewed  ECG    No new - Personally Reviewed  Physical Exam   GEN: No acute distress.   Neck: No significant JVD Cardiac: RRR, 3/6 harsh systolic murmur Respiratory: nl wob, clear BL,  GI: Soft, nontender, non-distended  MS: No edema, warm Neuro:  Nonfocal  Psych: Normal affect   Labs    High Sensitivity Troponin:   Recent Labs  Lab 07/19/2022 0900 08/13/2022 1056 08/06/2022 1616 07/24/2022 1743  TROPONINIHS 199* 189* 241* 250*      Chemistry Recent Labs  Lab 08/07/2022 1056 08/11/22 0541 08/09/2022 0213 08/11/2022 1410 07/27/2022 1411 07/21/2022 1415 08/13/22 0732  NA  --  135 136   < > 139 137 138  K  --  3.6 3.6   < > 3.6 3.5 3.8  CL  --  100 99  --   --   --  103  CO2  --  24 26  --   --   --  23  GLUCOSE  --  100* 107*  --   --   --  106*  BUN  --  15 20  --   --   --  23  CREATININE  --  1.08* 1.23*  --   --   --  0.89  CALCIUM  --  8.5* 8.7*  --   --   --  8.8*  MG 1.8  --  1.7  --   --   --   --   PROT  --   --  6.0*  --   --   --   --  ALBUMIN  --   --  3.0*  --   --   --   --   AST  --   --  18  --   --   --   --   ALT  --   --  11  --   --   --   --   ALKPHOS  --   --  39  --   --   --   --   BILITOT  --   --  0.5  --   --   --   --   GFRNONAA  --  50* 43*  --   --   --  >60  ANIONGAP  --  11 11  --   --   --  12   < > = values in this interval not displayed.    Lipids  Recent Labs  Lab 08/11/22 0541  CHOL 165  TRIG 65  HDL 48  LDLCALC 104*  CHOLHDL 3.4    Hematology Recent Labs  Lab 08/11/22 0541 08/13/2022 0213 08/02/2022 1410 08/01/2022 1411 07/24/2022 1415 08/13/22 0219  WBC 7.3 8.2  --   --   --  4.1  RBC 3.28* 3.26*  --   --   --  3.41*  HGB 11.0* 10.9*   < > 11.9* 11.6* 11.5*  HCT 32.5* 32.7*   < > 35.0* 34.0* 33.7*  MCV 99.1 100.3*  --   --   --  98.8  MCH 33.5 33.4  --   --   --  33.7  MCHC 33.8 33.3  --   --   --  34.1  RDW 13.5 13.2  --   --   --  13.0  PLT 257 264  --   --   --  276   < > = values in this interval not displayed.   Thyroid No results for input(s): "TSH", "FREET4" in the last 168 hours.  BNP Recent Labs  Lab 07/17/2022 0900  BNP 1,585.6*    DDimer  Recent Labs  Lab 08/01/2022 0900  DDIMER 3.00*     Radiology    CARDIAC CATHETERIZATION  Result Date: 07/22/2022 1.  Mild aorto ostial disease involving the ostial RCA and the ostial left main with less than 50% stenosis at both lesion sites 2.  Patent LAD, left circumflex, and RCA without any high-grade  stenoses 3.  Mild pulmonary hypertension with mean PA pressure 26 mmHg, mildly increased wedge pressure of 19 mmHg, and otherwise normal right heart hemodynamics.  Preserved cardiac output and index of 4.6 L/min and 2.99 L/min/m 4.  Known severe calcific aortic stenosis with heavy calcification and restriction of the aortic valve leaflets seen on plain fluoroscopy Recommendations: Resume heparin in 8 hours.  Continue TAVR evaluation with CT angiogram studies and formal cardiac surgical consultation as previously outlined    Cardiac Studies   TTE 08/11/22: IMPRESSIONS     1. Left ventricular ejection fraction, by estimation, is 55 to 60%. The  left ventricle has normal function. The left ventricle has no regional  wall motion abnormalities. Left ventricular diastolic function could not  be evaluated. There is severe  hypokinesis of the left ventricular, basal-mid inferoseptal wall and  inferior wall.   2. Right ventricular systolic function is normal. The right ventricular  size is normal. There is normal pulmonary artery systolic pressure. The  estimated right ventricular systolic pressure is [id number] mmHg.   3. The mitral  valve is degenerative. Mild mitral valve regurgitation. No  evidence of mitral stenosis. Moderate mitral annular calcification.   4. The aortic valve is calcified. There is severe calcifcation of the  aortic valve. There is severe thickening of the aortic valve. Aortic valve  regurgitation is not visualized. Severe aortic valve stenosis. Aortic  valve area, by VTI measures 0.46 cm.  Aortic valve mean gradient measures 18.0 mmHg. Aortic valve Vmax measures  3.02 m/s. Although the mean AVG is mild Vmax is not in the severe range,  the SVI is low at 15 and DVI 0.18. These findings are consistent with  paradoxical low flow low gradient  severe AS.   5. The inferior vena cava is normal in size with greater than 50%  respiratory variability, suggesting right atrial pressure of  3 mmHg.   08/01/2022 LHC/RHC 1.  Mild aorto ostial disease involving the ostial RCA and the ostial left main with less than 50% stenosis at both lesion sites 2.  Patent LAD, left circumflex, and RCA without any high-grade stenoses 3.  Mild pulmonary hypertension with mean PA pressure 26 mmHg, mildly increased wedge pressure of 19 mmHg, and otherwise normal right heart hemodynamics.  Preserved cardiac output and index of 4.6 L/min and 2.99 L/min/m 4.  Known severe calcific aortic stenosis with heavy calcification and restriction of the aortic valve leaflets seen on plain fluoroscopy   Recommendations: Resume heparin in 8 hours.  Continue TAVR evaluation with CT angiogram studies and formal cardiac surgical consultation as previously outlined  Patient Profile     89 y.o. female with history of HTN, HLD, PAD, prior DVT and mild carotid disease who presented with worsening chest pain and SOB found to have acute heart failure exacerbation in the setting of severe AS, ongoing TAVR w/u  Assessment & Plan  #Severe Paradoxical AS: TTE with mean gradient 1mHg, with Vmax 3.0 but with AVA 0.5cm2, DI 0.18 with low SVI consistent with severe, paradoxical AS. Visually, the valve appears severely stenotic. Suspect this is the primary driver of acute decompensation. S/p LHC/RHC. Ongoing TAVR work-up  [facility] pending [facility] T[facility] protocol (scheduled) and CT SUR Eval (discussed with them, they plan to see after the CT scan) prior to TAVR planning. Followed by Dr. CBurt Knack   #Acute Diastolic HF: Patient presented with worsening SOB and chest pain found to have [id number] CXR with pulmonary edema. TTE today with EF 50% with basal to mid septal and inferior wall hypokinesis and severe, parardoxical low flow, low gradient aortic stenosis planned for TAVR -euvolemic - RHC, normal hemodynamics, wedge mild at 19 -s/p IV lasix   #NSTEMI: Trop elevated to 250. Suspect demand in the setting of HF and  AS, but wall motion on TTE concerning for underlying CAD as well.  -no significant coronary lesions -Continue ASA '81mg'$  daily -Started metop '25mg'$  XL daily   #HTN: -Started metop '25mg'$  XL daily -holding losartan '100mg'$  daily for BP room  #HLD: -Increased crestor to '20mg'$  daily as LDL above goal at 104 -Repeat lipids in 8 weeks  #History of DVT on Warfarin: -Hold warfarin as will need cath -can continue heparin once INR<2   Time Spent Directly with Patient:  I have spent a total of 35 minutes with the patient reviewing hospital notes, telemetry, EKGs, labs and examining the patient as well as establishing an assessment and plan that was discussed personally with the patient.  > 50% of time was spent in direct patient care.  For questions or updates, please contact [place] Please consult [email or web address] for contact info under        Signed, Janina Mayo, MD  08/13/2022, 9:13 AM

## 2022-08-13 NOTE — Progress Notes (Signed)
PT Note:  Patient seen with complete evaluation to in separate note.  Completed test for Pre-TAVR assessment, but with hallway ambulation pt symptomatic with dizziness, decreased responsiveness, patient assisted to chair HR up to 133, then after assisted back to bed HR down to 41.  O2 appplied and RN in to assess with pt recovering and  HR 95, SpO1 100% on 4L O2 and BP 160/90.  MD made aware and family in the room.    5 Meter Walk Test:  Trial 1 18.6 seconds  Trial 2 16.7 seconds  Trial 3 20.2 seconds  3 Trial Average/Gait Speed 18.5 seconds/1.13 ft/sec (<1.8 ft/sec indicates high fall risk)    Magda Kiel, PT Acute Rehabilitation Services Office:[phone] 08/13/2022

## 2022-08-13 NOTE — Consult Note (Signed)
[address]       [place] [address]             [phone]           Karter Culbreth [facility]illed[facility] [place] Medical Record [id number] Date of Birth: 07-01-1933  No ref. provider found Plotnikov, Evie Lacks, MD  Chief Complaint:    Chief Complaint  Patient presents with   Chest Pain   Wheezing    History of Present Illness:     Pt is a very pleasant 89 yo with recent admission for increasing fatigue, SOB with exertion associated with CP. Pt also had increasing lower ext edema but no syncope or lightheadedness. She underwent echo with normal LV function and with a severely calcified Aortic valve with paradoxically low flow low gradient AS with a mean gradient of 77mHg but with a SVI of 15 and DVI of 0.18. Pt had cath without significant CAD and is awaiting CT scan after premedicated since her allergy to contrast dye. She is currently comfortable in bed and is hopeful to move forward with procedure this admission. She has been able to do all her activities of daily living prior to this admission. She is on chronic coumadin therapy due to embolism and has siblings all having CHF and oldest sibling is 9100and two sisters have passed with CHF.      Past Medical History:  Diagnosis Date   Adenomatous colon polyp    Allergic rhinitis    uses FLonase nightly   Anemia    Arthritis    joint pain   Blood transfusion    Bronchitis    [id number]   Carpal tunnel syndrome    numbness and tingling    Cataracts, bilateral    Chronic anticoagulation 03/21/2013   Congenital absence of one kidney    pt only has one kidney   Diarrhea    takes Immodium daily as needed or Lomotil    Diverticulosis    GERD (gastroesophageal reflux disease)    mild   Herpes    takes Valtrex daily   History of colon polyps    History of DVT (deep vein thrombosis) 1998   right arm   History of staph infection    Hyperlipidemia    Medical MD doesn't think its absolutely necessary    Hypertension    takes Losartan daily   IBS (irritable bowel syndrome)    takes Align daily   Insomnia    takes Ambien nightly as needed   LBP (low back pain)    Long term (current) use of anticoagulants    Dr. GrBeryle Beams Nocturia    Osteoporosis    Personal history of radiation therapy    Raynaud's disease    Sarcoma of left lower extremity ([facility]   Dr. AlWinfield [place] Seizures ([facility] [place]/12[place]2017   has not had a seizure since may 2019   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[facility]07[place]2011   Right digital artery 1998 idiopathic    Urinary leakage    UTI (urinary tract infection)     Past Surgical History:  Procedure Laterality Date    kidney disease left      ABDOMINAL HYSTERECTOMY     ANTERIOR CERVICAL DECOMP/DISCECTOMY FUSION N/A 01/18/2014   Procedure: ANTERIOR CERVICAL DECOMPRESSION/DISCECTOMY FUSION 3 LEVELS  Cervical  four/five, five/six, six/seven anterior cervical decompression with fusion interbody prosthesis with plating and bonegraft;  Surgeon: JeNewman PiesMD;  Location:  [facility];  Service: Neurosurgery;  Laterality: N/A;  t   APPENDECTOMY     BACK SURGERY     2   BREAST BIOPSY Left 30+ yrs ago   benign   BREAST BIOPSY Left 07/09/2018   BREAST LUMPECTOMY Left 08/06/2018   BREAST LUMPECTOMY WITH RADIOACTIVE SEED LOCALIZATION Left 08/06/2018   Procedure: LEFT BREAST LUMPECTOMY WITH RADIOACTIVE SEED LOCALIZATION;  Surgeon: Fanny Skates, MD;  Location: [facility];  Service: General;  Laterality: Left;   BREAST SURGERY     cystectomy-benign   CARPAL TUNNEL RELEASE Right 06/12/2014   Procedure: CARPAL TUNNEL RELEASE;  Surgeon: Newman Pies, MD;  Location: [facility];  Service: Neurosurgery;  Laterality: Right;  Right Carpal Tunnel Release   CATARACT EXTRACTION Bilateral 02/15/2015   CHOLECYSTECTOMY     COLONOSCOPY     ECTOPIC PREGNANCY SURGERY  1959   HIP CLOSED REDUCTION Right 08/23/2017   Procedure: CLOSED MANIPULATION HIP;  Surgeon:  Nicholes Stairs, MD;  Location: [facility];  Service: Orthopedics;  Laterality: Right;   I&D of abdomen  1988   couple of wks after gallbladder removed   KNEE SURGERY     left x 3   LUMBAR LAMINECTOMY     X 2   mortons neuromas removed     OPEN SURGICAL REPAIR OF GLUTEAL TENDON Right 06/15/2017   Procedure: OPEN SURGICAL REPAIR OF GLUTEALmedius TENDON;  Surgeon: Paralee Cancel, MD;  Location: [facility];  Service: Orthopedics;  Laterality: Right;   RIGHT/LEFT HEART CATH AND CORONARY ANGIOGRAPHY N/A 07/29/2022   Procedure: RIGHT/LEFT HEART CATH AND CORONARY ANGIOGRAPHY;  Surgeon: Sherren Mocha, MD;  Location: [facility];  Service: Cardiovascular;  Laterality: N/A;   SHOULDER SURGERY     Right   TOTAL HIP ARTHROPLASTY Right 06/15/2017   Procedure: Right total hip arthroplasty, bursectomy, repair of gluteal tendon, posterior approach;  Surgeon: Paralee Cancel, MD;  Location: [facility];  Service: Orthopedics;  Laterality: Right;  90 mins for all procedures together    Social History   Tobacco Use  Smoking Status Never  Smokeless Tobacco Never    Social History   Substance and Sexual Activity  Alcohol Use No   Alcohol/week: 0.0 standard drinks of alcohol    Social History   Socioeconomic History   Marital status: Married    Spouse name: Not on file   Number of children: 2   Years of education: Not on file   Highest education level: Not on file  Occupational History   Occupation: Retired    Fish farm manager: RETIRED  Tobacco Use   Smoking status: Never   Smokeless tobacco: Never  Vaping Use   Vaping Use: Never used  Substance and Sexual Activity   Alcohol use: No    Alcohol/week: 0.0 standard drinks of alcohol   Drug use: No   Sexual activity: Yes  Other Topics Concern   Not on file  Social History Narrative   Lives home with husband.  Education- graduate level.  Children 2.     Social Determinants of Health   Financial Resource Strain: Low Risk  (11/04/2021)   Overall  Financial Resource Strain (CARDIA)    Difficulty of Paying Living Expenses: Not hard at all  Food Insecurity: No Food Insecurity (08/09/2022)   Hunger Vital Sign    Worried About Running Out of Food in the Last Year: Never true    Ran Out of Food in the Last Year: Never true  Transportation Needs: No  Transportation Needs (07/19/2022)   PRAPARE - Hydrologist (Medical): No    Lack of Transportation (Non-Medical): No  Physical Activity: Insufficiently Active (11/04/2021)   Exercise Vital Sign    Days of Exercise per Week: 5 days    Minutes of Exercise per Session: 10 min  Stress: No Stress Concern Present (11/04/2021)   [place]    Feeling of Stress : Not at all  Social Connections: Moderately Integrated (11/04/2021)   Social Connection and Isolation Panel [NHANES]    Frequency of Communication with Friends and Family: Three times a week    Frequency of Social Gatherings with Friends and Family: Three times a week    Attends Religious Services: 1 to 4 times per year    Active Member of Clubs or Organizations: No    Attends Archivist Meetings: Never    Marital Status: Married  Human resources officer Violence: Not At Risk (07/24/2022)   Humiliation, Afraid, Rape, and Kick questionnaire    Fear of Current or Ex-Partner: No    Emotionally Abused: No    Physically Abused: No    Sexually Abused: No    Allergies  Allergen Reactions   Bee Venom Anaphylaxis and Other (See Comments)    Yellow jackets, wasps as well   Honey Bee Venom Anaphylaxis   Iohexol Anaphylaxis   Ivp Dye [Iodinated Contrast Media] Anaphylaxis   Meperidine Hcl Anaphylaxis   Shellfish Allergy Anaphylaxis   Temazepam Other (See Comments)    Severe Hallucinations and Paranoia   Amlodipine Besylate Swelling   Aspirin Other (See Comments)    REACTION: break out in sweats   Atorvastatin Diarrhea   Cholestyramine Other (See  Comments)    REACTION: mouth irritation   Codeine Phosphate Nausea And Vomiting    Can take Tramadol ok   Demerol [Meperidine] Other (See Comments)    Severe Hallucination!!!!   Diphenhydramine Hcl Other (See Comments)    hyperactive   Doxycycline Nausea Only   Erythromycin Base Other (See Comments)   Gentamicin Sulfate Other (See Comments)    REACTION: Shortness of breath    Keppra [Levetiracetam]     Made her too groggy/ sleepy   Meclizine Other (See Comments)   Meclizine Hcl Other (See Comments)    REACTION: more dizziness on it   Metronidazole Other (See Comments)    REACTION: bad taste   Moxifloxacin Other (See Comments)    REACTION: insomnia Able to use Cipro   Niacin Other (See Comments)   Sulfamethoxazole Other (See Comments)    Kidney problems   Amoxicillin Rash   Iodine Rash   Penicillins Other (See Comments), Rash and Swelling    Has patient had a PCN reaction causing immediate rash, facial/tongue/throat swelling, SOB or lightheadedness with hypotension: Yes  Has patient had a PCN reaction causing severe rash involving mucus membranes or skin necrosis: No  Has patient had a PCN reaction that required hospitalization: No  Has patient had a PCN reaction occurring within the last 10 years: No  If all of the above answers are "NO", then may proceed with Cephalosporin use.   Sulfa Antibiotics Rash and Other (See Comments)    Current Facility-Administered Medications  Medication Dose Route Frequency Provider Last Rate Last Admin   0.9 %  sodium chloride infusion  250 mL Intravenous PRN Sherren Mocha, MD       acetaminophen (TYLENOL) tablet 650 mg  650 mg Oral Q4H  PRN Charlynne Cousins, MD   650 mg at 08/11/2022 2059   aspirin EC tablet 81 mg  81 mg Oral Daily Charlynne Cousins, MD   81 mg at 08/13/22 1004   [START ON 08/14/2022] diphenhydrAMINE (BENADRYL) capsule 50 mg  50 mg Oral Once Tommie Raymond, NP       famotidine (PEPCID) tablet 20 mg  20 mg Oral  Daily Charlynne Cousins, MD   20 mg at 08/13/22 1004   heparin ADULT infusion 100 units/mL (25000 units/222m)  1,050 Units/hr Intravenous Continuous MKris Mouton RPH 10.5 mL/hr at 08/13/22 0756 1,050 Units/hr at 08/13/22 0756   lacosamide (VIMPAT) tablet 50 mg  50 mg Oral BID PElodia Florence, MD   50 mg at 08/13/22 1004   metoprolol succinate (TOPROL-XL) 24 hr tablet 25 mg  25 mg Oral Daily PFreada Bergeron MD   25 mg at 08/13/22 1005   ondansetron (ZOFRAN) injection 4 mg  4 mg Intravenous Q6H PRN FCharlynne Cousins MD       predniSONE (DELTASONE) tablet 50 mg  50 mg Oral Q6H MKathyrn DrownD, NP       rosuvastatin (CRESTOR) tablet 20 mg  20 mg Oral Daily PFreada Bergeron MD   20 mg at 08/13/22 1004   sodium chloride flush (NS) 0.9 % injection 3 mL  3 mL Intravenous Q12H CSherren Mocha MD   3 mL at 08/13/22 1006   sodium chloride flush (NS) 0.9 % injection 3 mL  3 mL Intravenous PRN CSherren Mocha MD       traMADol (Veatrice [place]bon tablet 50 mg  50 mg Oral Q8H PRN ZMargie Billet NP   50 mg at 08/11/2022 2223   zolpidem (AMBIEN) tablet 5 mg  5 mg Oral QHS PRN FCharlynne Cousins MD   5 mg at 07/24/2022 2059     Family History  Problem Relation Age of Onset   Breast cancer Sister    Kidney disease Sister 260      nephritis   Parkinsonism Mother    Heart disease Brother    Osteoarthritis Other    Ulcerative colitis Sister    Kidney disease Sister    Breast cancer Sister    Colon cancer Neg Hx        Physical Exam: BP 112/65 (BP Location: Right Arm)   Pulse 79   Temp 97.9 F (36.6 C) (Oral)   Resp 16   Ht '5\' 4"'$  (1.626 m)   Wt 55.2 kg   SpO2 100%   BMI 20.89 kg/m   Lungs:crackles at bases Card: rr with high pitched 3/6 sem Ext: thin with 1+ edema of lower ext Neuro: alert and oriented Teeth in good repair   Diagnostic Studies & Laboratory data: I have personally reviewed the following studies and agree with the findings   TTE (07/2022) IMPRESSIONS      1. Left ventricular ejection fraction, by estimation, is 55 to 60%. The  left ventricle has normal function. The left ventricle has no regional  wall motion abnormalities. Left ventricular diastolic function could not  be evaluated. There is severe  hypokinesis of the left ventricular, basal-mid inferoseptal wall and  inferior wall.   2. Right ventricular systolic function is normal. The right ventricular  size is normal. There is normal pulmonary artery systolic pressure. The  estimated right ventricular systolic pressure is 2Q000111QmmHg.   3. The mitral valve is degenerative. Mild mitral valve regurgitation. No  evidence of mitral stenosis. Moderate mitral annular calcification.   4. The aortic valve is calcified. There is severe calcifcation of the  aortic valve. There is severe thickening of the aortic valve. Aortic valve  regurgitation is not visualized. Severe aortic valve stenosis. Aortic  valve area, by VTI measures 0.46 cm.  Aortic valve mean gradient measures 18.0 mmHg. Aortic valve Vmax measures  3.02 m/s. Although the mean AVG is mild Vmax is not in the severe range,  the SVI is low at 15 and DVI 0.18. These findings are consistent with  paradoxical low flow low gradient  severe AS.   5. The inferior vena cava is normal in size with greater than 50%  respiratory variability, suggesting right atrial pressure of 3 mmHg.   FINDINGS   Left Ventricle: Left ventricular ejection fraction, by estimation, is 55  to 60%. The left ventricle has normal function. The left ventricle has no  regional wall motion abnormalities. Severe hypokinesis of the left  ventricular, basal-mid inferoseptal  wall and inferior wall. The left ventricular internal cavity size was  normal in size. There is no left ventricular hypertrophy. Left ventricular  diastolic function could not be evaluated.   Right Ventricle: The right ventricular size is normal. No increase in  right ventricular wall  thickness. Right ventricular systolic function is  normal. There is normal pulmonary artery systolic pressure. The tricuspid  regurgitant velocity is 2.31 m/s, and   with an assumed right atrial pressure of 3 mmHg, the estimated right  ventricular systolic pressure is Q000111Q mmHg.   Left Atrium: Left atrial size was normal in size.   Right Atrium: Right atrial size was normal in size.   Pericardium: There is no evidence of pericardial effusion.   Mitral Valve: The mitral valve is degenerative in appearance. There is  moderate thickening of the mitral valve leaflet(s). There is moderate  calcification of the mitral valve leaflet(s). Moderate mitral annular  calcification. Mild mitral valve  regurgitation. No evidence of mitral valve stenosis. MV peak gradient, 5.5  mmHg. The mean mitral valve gradient is 2.0 mmHg.   Tricuspid Valve: The tricuspid valve is normal in structure. Tricuspid  valve regurgitation is mild . No evidence of tricuspid stenosis.   Aortic Valve: The aortic valve is calcified. There is severe calcifcation  of the aortic valve. There is severe thickening of the aortic valve.  Aortic valve regurgitation is not visualized. Severe aortic stenosis is  present. Aortic valve mean gradient  measures 18.0 mmHg. Aortic valve peak gradient measures 36.5 mmHg. Aortic  valve area, by VTI measures 0.46 cm.   Pulmonic Valve: The pulmonic valve was normal in structure. Pulmonic valve  regurgitation is trivial. No evidence of pulmonic stenosis.   Aorta: The aortic root is normal in size and structure.   Venous: The inferior vena cava is normal in size with greater than 50%  respiratory variability, suggesting right atrial pressure of 3 mmHg.   IAS/Shunts: No atrial level shunt detected by color flow Doppler.     LEFT VENTRICLE  PLAX 2D  LVIDd:         4.00 cm  LVIDs:         2.80 cm  LV PW:         0.90 cm  LV IVS:        1.00 cm  LVOT diam:     1.80 cm  LV SV:          23  LV SV  Index:   15  LVOT Area:     2.54 cm     RIGHT VENTRICLE             IVC  RV Basal diam:  2.80 cm     IVC diam: 1.70 cm  RV S prime:     15.70 cm/s  TAPSE (M-mode): 2.5 cm   LEFT ATRIUM             Index        RIGHT ATRIUM          Index  LA diam:        3.80 cm 2.41 cm/m   RA Area:     6.82 cm  LA Vol (A2C):   30.3 ml 19.24 ml/m  RA Volume:   14.40 ml 9.15 ml/m  LA Vol (A4C):   46.3 ml 29.40 ml/m  LA Biplane Vol: 38.6 ml 24.51 ml/m   AORTIC VALVE                     PULMONIC VALVE  AV Area (Vmax):    0.53 cm      PV Vmax:          1.01 m/s  AV Area (Vmean):   0.53 cm      PV Peak grad:     4.1 mmHg  AV Area (VTI):     0.46 cm      PR End Diast Vel: 6.55 msec  AV Vmax:           302.00 cm/s  AV Vmean:          193.000 cm/s  AV VTI:            0.501 m  AV Peak Grad:      36.5 mmHg  AV Mean Grad:      18.0 mmHg  LVOT Vmax:         62.50 cm/s  LVOT Vmean:        39.900 cm/s  LVOT VTI:          0.090 m  LVOT/AV VTI ratio: 0.18    AORTA  Ao Root diam: 2.50 cm  Ao Asc diam:  3.30 cm  Ao Arch diam: 2.3 cm   MITRAL VALVE             TRICUSPID VALVE  MV Area VTI:  1.23 cm   TR Peak grad:   21.3 mmHg  MV Peak grad: 5.5 mmHg   TR Vmax:        231.00 cm/s  MV Mean grad: 2.0 mmHg  MV Vmax:      1.17 m/s   SHUNTS  MV Vmean:     70.7 cm/s  Systemic VTI:  0.09 m                           Systemic Diam: 1.80 cm    Recent Radiology Findings:   CARDIAC CATHETERIZATION  Result Date: 08/01/2022 1.  Mild aorto ostial disease involving the ostial RCA and the ostial left main with less than 50% stenosis at both lesion sites 2.  Patent LAD, left circumflex, and RCA without any high-grade stenoses 3.  Mild pulmonary hypertension with mean PA pressure 26 mmHg, mildly increased wedge pressure of 19 mmHg, and otherwise normal right heart hemodynamics.  Preserved cardiac output and index of 4.6 L/min and 2.99 L/min/m 4.  Known severe calcific aortic stenosis with  heavy  calcification and restriction of the aortic valve leaflets seen on plain fluoroscopy Recommendations: Resume heparin in 8 hours.  Continue TAVR evaluation with CT angiogram studies and formal cardiac surgical consultation as previously outlined      Recent Lab Findings: Lab Results  Component Value Date   WBC 4.1 08/13/2022   HGB 11.5 (L) 08/13/2022   HCT 33.7 (L) 08/13/2022   PLT 276 08/13/2022   GLUCOSE 106 (H) 08/13/2022   CHOL 165 08/11/2022   TRIG 65 08/11/2022   HDL 48 08/11/2022   LDLDIRECT 129.8 05/31/2012   LDLCALC 104 (H) 08/11/2022   ALT 11 08/09/2022   AST 18 08/08/2022   NA 138 08/13/2022   K 3.8 08/13/2022   CL 103 08/13/2022   CREATININE 0.89 08/13/2022   BUN 23 08/13/2022   CO2 23 08/13/2022   TSH 2.50 06/23/2022   INR 1.4 (H) 08/13/2022   HGBA1C 5.2 08/11/2022      Assessment / Plan:     89 yo female with NYHA class 3 symptoms of severe paradoxical low flow low gradient AS. Pt has normal LV function but with her age and comorbidities would best be served with TAVR. We are awaiting CT scan for final assesment of anatomy. Pt wishes to proceed after all the risks and goals of the procedure were discussed including arterial injury, need for PPM, stroke and death. She understands that if aortic disruption occurs at time of procedure that it would only be if reasonable would we proceed with open repair.    I have spent 60 min in review of the records, viewing studies and in face to face with patient and in coordination of future care    Coralie Common 08/13/2022 4:05 PM

## 2022-08-13 NOTE — Progress Notes (Signed)
PROGRESS NOTE    Tracy Peters  [id number] DOB: October 09, 1933 DOA: 07/22/2022 PCP: Cassandria Anger, MD  Chief Complaint  Patient presents with   Chest Pain   Wheezing    Brief Narrative:   Tracy Peters is a 89 y.o. female past medical history of DVT on Coumadin, peripheral vascular disease & hypertension who presented to the ED on 2/25 for shortness of breath x 2 weeks, progressively worsening as well as 2 days of chest pain at rest.  The emergency room, patient found to have new onset heart failure with a BNP of 1500 and chest x-ray noting pulmonary edema.  Patient admitted for treatment of new onset acute congestive heart failure.  Cardiology consulted.  Workup revealed severe aortic stenosis.  Patient underwent left and right heart catheterization revealing mild pulmonary hypertension and confirming severe calcific aortic stenosis.  CT surgery consulted and will see patient following CT angiogram as per cardiac protocol.  Assessment & Plan:   Principal Problem:   Elevated troponin I level Active Problems:   Essential hypertension   NSTEMI (non-ST elevated myocardial infarction) (HCC)   Elevated d-dimer   Elevated troponin   Severe aortic stenosis  Shortness of Breath HFpEF  secondary to severe aortic Stenosis Appreciate cardiology help.  Continue diuresis.  Status post left and right heart catheterization.  Will need CT surgery intervention.    Chest Pain  NSTEMI  Elevated Troponin Appreciate cardiology recommendations.  Elevated troponins initially thought to be demand ischemia in the setting of heart failure and aortic stenosis, but wall motion abnormalities on 2D echo concerning for underlying CAD.  Status post left and right heart catheterization with no significant coronary lesions noted.  Continue heparin infusion plus aspirin.  Continue Crestor, LDL at 104 with target below 70.  Continue antihypertensives   Essential hypertension Metoprolol,  losartan  Hx DVT Heparin while warfarin on hold  INR 1.8 Heparin gtt while INR <2  Abnormal CXR No infectious symptoms, monitor - suspect related to HF above Repeat as indicated   DVT prophylaxis: heparin gtt Code Status: full Family Communication: Will call family Disposition:   Status is: Inpatient Remains inpatient appropriate because: Awaiting evaluation by CT surgery   Consultants:  Cardiology Cardiothoracic surgery  Procedures:  Status post left and right heart catheterization  Echo IMPRESSIONS     1. Left ventricular ejection fraction, by estimation, is 55 to 60%. The  left ventricle has normal function. The left ventricle has no regional  wall motion abnormalities. Left ventricular diastolic function could not  be evaluated. There is severe  hypokinesis of the left ventricular, basal-mid inferoseptal wall and  inferior wall.   2. Right ventricular systolic function is normal. The right ventricular  size is normal. There is normal pulmonary artery systolic pressure. The  estimated right ventricular systolic pressure is [id number] mmHg.   3. The mitral valve is degenerative. Mild mitral valve regurgitation. No  evidence of mitral stenosis. Moderate mitral annular calcification.   4. The aortic valve is calcified. There is severe calcifcation of the  aortic valve. There is severe thickening of the aortic valve. Aortic valve  regurgitation is not visualized. Severe aortic valve stenosis. Aortic  valve area, by VTI measures 0.46 cm.  Aortic valve mean gradient measures 18.0 mmHg. Aortic valve Vmax measures  3.02 m/s. Although the mean AVG is mild Vmax is not in the severe range,  the SVI is low at 15 and DVI 0.18. These findings are consistent with  paradoxical  low flow low gradient  severe AS.   5. The inferior vena cava is normal in size with greater than 50%  respiratory variability, suggesting right atrial pressure of 3 mmHg.   Antimicrobials:  Anti-infectives  (From admission, onward)    Start     Dose/Rate Route Frequency Ordered Stop   07/22/2022 1145  cefTRIAXone (ROCEPHIN) 1 g in sodium chloride 0.9 % 100 mL IVPB        1 g 200 mL/hr over 30 Minutes Intravenous  Once 07/23/2022 1132 07/26/2022 1313   07/30/2022 1145  doxycycline (VIBRA-TABS) tablet 100 mg        100 mg Oral  Once 07/31/2022 1132 08/09/2022 1236       Subjective: Tired  Objective: Vitals:   08/13/22 0427 08/13/22 0600 08/13/22 0955 08/13/22 0958  BP: (!) 116/53  (!) 88/73 (!) 112/49  Pulse: 71   83  Resp:      Temp:      TempSrc:      SpO2: 95%   97%  Weight:  55.2 kg    Height:        Intake/Output Summary (Last 24 hours) at 08/13/2022 1401 Last data filed at 08/13/2022 0400 Gross per 24 hour  Intake 49.96 ml  Output 400 ml  Net -350.04 ml    Filed Weights   08/09/2022 0319 08/09/2022 1135 08/13/22 0600  Weight: 52.2 kg 52.2 kg 55.2 kg    Examination:  General: Alert and oriented x 3, no acute distress. Cardiovascular: Regular rate and rhythm, S1-S2, 3 out of 6 systolic ejection murmur Lungs: Poor inspiratory effort, otherwise clear to auscultation bilaterally Abdomen: Soft, nontender, nondistended, positive bowel sounds Neurological: No focal deficits Extremities: No clubbing or cyanosis.  Trace pitting edema.   Data Reviewed: I have personally reviewed following labs and imaging studies  CBC: Recent Labs  Lab 08/07/2022 0900 08/11/22 0541 08/07/2022 0213 08/01/2022 1410 07/30/2022 1411 08/09/2022 1415 08/13/22 0219  WBC 7.1 7.3 8.2  --   --   --  4.1  NEUTROABS  --   --  4.8  --   --   --   --   HGB 12.3 11.0* 10.9* 11.2* 11.9* 11.6* 11.5*  HCT 37.1 32.5* 32.7* 33.0* 35.0* 34.0* 33.7*  MCV 99.5 99.1 100.3*  --   --   --  98.8  PLT 240 257 264  --   --   --  276     Basic Metabolic Panel: Recent Labs  Lab 08/11/2022 0900 08/11/2022 1056 08/11/22 0541 08/06/2022 0213 07/30/2022 1410 08/03/2022 1411 07/30/2022 1415 08/13/22 0732  NA 135  --  135 136 139 139 137  138  K 3.9  --  3.6 3.6 3.5 3.6 3.5 3.8  CL 101  --  100 99  --   --   --  103  CO2 21*  --  24 26  --   --   --  23  GLUCOSE 94  --  100* 107*  --   --   --  106*  BUN 15  --  15 20  --   --   --  23  CREATININE 0.99  --  1.08* 1.23*  --   --   --  0.89  CALCIUM 9.4  --  8.5* 8.7*  --   --   --  8.8*  MG  --  1.8  --  1.7  --   --   --   --  PHOS  --   --   --  4.1  --   --   --   --      GFR: Estimated Creatinine Clearance: 39.2 mL/min (by C-G formula based on SCr of 0.89 mg/dL).  Liver Function Tests: Recent Labs  Lab 08/05/2022 0213  AST 18  ALT 11  ALKPHOS 39  BILITOT 0.5  PROT 6.0*  ALBUMIN 3.0*    CBG: Recent Labs  Lab 08/05/2022 2146  GLUCAP 122*      Recent Results (from the past 240 hour(s))  Resp panel by RT-PCR (RSV, Flu A&B, Covid) Anterior Nasal Swab     Status: None   Collection Time: 07/27/2022  9:12 AM   Specimen: Anterior Nasal Swab  Result Value Ref Range Status   SARS Coronavirus 2 by RT PCR NEGATIVE NEGATIVE Final    Comment: (NOTE) SARS-CoV-2 target nucleic acids are NOT DETECTED.  The SARS-CoV-2 RNA is generally detectable in upper respiratory specimens during the acute phase of infection. The lowest concentration of SARS-CoV-2 viral copies this assay can detect is 138 copies/mL. A negative result does not preclude SARS-Cov-2 infection and should not be used as the sole basis for treatment or other patient management decisions. A negative result may occur with  improper specimen collection/handling, submission of specimen other than nasopharyngeal swab, presence of viral mutation(s) within the areas targeted by this assay, and inadequate number of viral copies(<138 copies/mL). A negative result must be combined with clinical observations, patient history, and epidemiological information. The expected result is Negative.  Fact Sheet for Patients:  EntrepreneurPulse.com.au  Fact Sheet for Healthcare Providers:   IncredibleEmployment.be  This test is no t yet approved or cleared by the [place] FDA and  has been authorized for detection and/or diagnosis of SARS-CoV-2 by FDA under an Emergency Use Authorization (EUA). This EUA will remain  in effect (meaning this test can be used) for the duration of the COVID-19 declaration under Section 564(b)(1) of the Act, 21 U.S.C.section 360bbb-3(b)(1), unless the authorization is terminated  or revoked sooner.       Influenza A by PCR NEGATIVE NEGATIVE Final   Influenza B by PCR NEGATIVE NEGATIVE Final    Comment: (NOTE) The Xpert Xpress SARS-CoV-2/FLU/RSV plus assay is intended as an aid in the diagnosis of influenza from Nasopharyngeal swab specimens and should not be used as a sole basis for treatment. Nasal washings and aspirates are unacceptable for Xpert Xpress SARS-CoV-2/FLU/RSV testing.  Fact Sheet for Patients: EntrepreneurPulse.com.au  Fact Sheet for Healthcare Providers: IncredibleEmployment.be  This test is not yet approved or cleared by the [place] FDA and has been authorized for detection and/or diagnosis of SARS-CoV-2 by FDA under an Emergency Use Authorization (EUA). This EUA will remain in effect (meaning this test can be used) for the duration of the COVID-19 declaration under Section 564(b)(1) of the Act, 21 U.S.C. section 360bbb-3(b)(1), unless the authorization is terminated or revoked.     Resp Syncytial Virus by PCR NEGATIVE NEGATIVE Final    Comment: (NOTE) Fact Sheet for Patients: EntrepreneurPulse.com.au  Fact Sheet for Healthcare Providers: IncredibleEmployment.be  This test is not yet approved or cleared by the [place] FDA and has been authorized for detection and/or diagnosis of SARS-CoV-2 by FDA under an Emergency Use Authorization (EUA). This EUA will remain in effect (meaning this test can be used) for  the duration of the COVID-19 declaration under Section 564(b)(1) of the Act, 21 U.S.C. section 360bbb-3(b)(1), unless the authorization is terminated or  revoked.  Performed at [facility], [address] [place], [place] [address]   MRSA Next Gen by PCR, Nasal     Status: None   Collection Time: 08/11/22 11:57 PM   Specimen: Nasal Mucosa; Nasal Swab  Result Value Ref Range Status   MRSA by PCR Next Gen NOT DETECTED NOT DETECTED Final    Comment: (NOTE) The GeneXpert MRSA Assay (FDA approved for NASAL specimens only), is one component of a comprehensive MRSA colonization surveillance program. It is not intended to diagnose MRSA infection nor to guide or monitor treatment for MRSA infections. Test performance is not FDA approved in patients less than 33 years old. Performed at [facility], [place] [address], [place], [place] [address]          Radiology Studies: CARDIAC CATHETERIZATION  Result Date: 07/25/2022 1.  Mild aorto ostial disease involving the ostial RCA and the ostial left main with less than 50% stenosis at both lesion sites 2.  Patent LAD, left circumflex, and RCA without any high-grade stenoses 3.  Mild pulmonary hypertension with mean PA pressure 26 mmHg, mildly increased wedge pressure of 19 mmHg, and otherwise normal right heart hemodynamics.  Preserved cardiac output and index of 4.6 L/min and 2.99 L/min/m 4.  Known severe calcific aortic stenosis with heavy calcification and restriction of the aortic valve leaflets seen on plain fluoroscopy Recommendations: Resume heparin in 8 hours.  Continue TAVR evaluation with CT angiogram studies and formal cardiac surgical consultation as previously outlined        Scheduled Meds:  aspirin EC  81 mg Oral Daily   [START ON 08/14/2022] diphenhydrAMINE  50 mg Oral Once   famotidine  20 mg Oral Daily   lacosamide  50 mg Oral BID   metoprolol succinate  25 mg Oral Daily   predniSONE  50 mg Oral  Q6H   rosuvastatin  20 mg Oral Daily   sodium chloride flush  3 mL Intravenous Q12H   Continuous Infusions:  sodium chloride     heparin 1,050 Units/hr (08/13/22 0756)     LOS: 3 days      Annita Brod, MD [facility]   To contact the attending provider between 7A-7P or the covering provider during after hours 7P-7A, please log into the web site [email or web address] and access using universal [place] password for that web site. If you do not have the password, please call the hospital operator.  08/13/2022, 2:01 PM

## 2022-08-14 ENCOUNTER — Inpatient Hospital Stay (HOSPITAL_COMMUNITY): Payer: Medicare Other

## 2022-08-14 DIAGNOSIS — R7989 Other specified abnormal findings of blood chemistry: Secondary | ICD-10-CM | POA: Diagnosis not present

## 2022-08-14 DIAGNOSIS — I5031 Acute diastolic (congestive) heart failure: Secondary | ICD-10-CM | POA: Diagnosis not present

## 2022-08-14 DIAGNOSIS — I10 Essential (primary) hypertension: Secondary | ICD-10-CM | POA: Diagnosis not present

## 2022-08-14 DIAGNOSIS — I350 Nonrheumatic aortic (valve) stenosis: Secondary | ICD-10-CM | POA: Diagnosis not present

## 2022-08-14 DIAGNOSIS — I214 Non-ST elevation (NSTEMI) myocardial infarction: Secondary | ICD-10-CM | POA: Diagnosis not present

## 2022-08-14 DIAGNOSIS — R55 Syncope and collapse: Secondary | ICD-10-CM | POA: Insufficient documentation

## 2022-08-14 LAB — CBC
HCT: 33 % — ABNORMAL LOW (ref 36.0–46.0)
Hemoglobin: 11.1 g/dL — ABNORMAL LOW (ref 12.0–15.0)
MCH: 33.3 pg (ref 26.0–34.0)
MCHC: 33.6 g/dL (ref 30.0–36.0)
MCV: 99.1 fL (ref 80.0–100.0)
Platelets: 271 10*3/uL (ref 150–400)
RBC: 3.33 MIL/uL — ABNORMAL LOW (ref 3.87–5.11)
RDW: 12.9 % (ref 11.5–15.5)
WBC: 7.8 10*3/uL (ref 4.0–10.5)
nRBC: 0 % (ref 0.0–0.2)

## 2022-08-14 LAB — BASIC METABOLIC PANEL
Anion gap: 12 (ref 5–15)
BUN: 30 mg/dL — ABNORMAL HIGH (ref 8–23)
CO2: 24 mmol/L (ref 22–32)
Calcium: 8.5 mg/dL — ABNORMAL LOW (ref 8.9–10.3)
Chloride: 99 mmol/L (ref 98–111)
Creatinine, Ser: 0.96 mg/dL (ref 0.44–1.00)
GFR, Estimated: 58 mL/min — ABNORMAL LOW (ref >60)
Glucose, Bld: 134 mg/dL — ABNORMAL HIGH (ref 70–99)
Potassium: 3.9 mmol/L (ref 3.5–5.1)
Sodium: 135 mmol/L (ref 135–145)

## 2022-08-14 LAB — HEPARIN LEVEL (UNFRACTIONATED)
Heparin Unfractionated: 0.76 IU/mL — ABNORMAL HIGH (ref 0.30–0.70)
Heparin Unfractionated: 0.74 IU/mL — ABNORMAL HIGH (ref 0.30–0.70)

## 2022-08-14 LAB — PROTIME-INR
INR: 1.4 — ABNORMAL HIGH (ref 0.8–1.2)
Prothrombin Time: 17 seconds — ABNORMAL HIGH (ref 11.4–15.2)

## 2022-08-14 LAB — LIPOPROTEIN A (LPA): Lipoprotein (a): 97.5 nmol/L — ABNORMAL HIGH (ref <75.0)

## 2022-08-14 MED ORDER — WARFARIN - PHARMACIST DOSING INPATIENT: Freq: Every day | Status: DC

## 2022-08-14 MED ORDER — METOPROLOL TARTRATE 5 MG/5ML IV SOLN
INTRAVENOUS | Status: AC
  Filled 2022-08-14: qty 5

## 2022-08-14 MED ORDER — WARFARIN SODIUM 5 MG PO TABS
10.0000 mg | ORAL_TABLET | Freq: Once | ORAL | Status: AC
  Administered 2022-08-14: 10 mg via ORAL
  Filled 2022-08-14: qty 2

## 2022-08-14 MED ORDER — IOHEXOL 350 MG/ML SOLN
100.0000 mL | Freq: Once | INTRAVENOUS | Status: AC | PRN
  Administered 2022-08-14: 100 mL via INTRAVENOUS

## 2022-08-14 NOTE — Progress Notes (Signed)
ANTICOAGULATION CONSULT NOTE  Pharmacy Consult for heparin Indication:  NSTEMI   Patient Measurements: Height: '5\' 4"'$  (162.6 cm) Weight: 55.2 kg (121 lb 11.2 oz) IBW/kg (Calculated) : 54.7 Heparin Dosing Weight: TBW  Vital Signs: Temp: 97.7 F (36.5 C) (02/28 2012) Temp Source: Oral (02/28 2012) BP: 123/57 (02/28 2012) Pulse Rate: 89 (02/28 2012)  Labs: Recent Labs    07/21/2022 0213 07/25/2022 1209 08/02/2022 1410 07/22/2022 1415 08/13/22 0219 08/13/22 0732 08/13/22 1604 08/14/22 0203  HGB 10.9*  --    < > 11.6* 11.5*  --   --  11.1*  HCT 32.7*  --    < > 34.0* 33.7*  --   --  33.0*  PLT 264  --   --   --  276  --   --  271  LABPROT 22.2* 20.6*  --   --  17.1*  --   --  17.0*  INR 2.0* 1.8*  --   --  1.4*  --   --  1.4*  HEPARINUNFRC  --   --   --   --  0.24*  --  0.97* 0.76*  CREATININE 1.23*  --   --   --   --  0.89  --  0.96   < > = values in this interval not displayed.     Estimated Creatinine Clearance: 36.3 mL/min (by C-G formula based on SCr of 0.96 mg/dL).  Assessment: 50 YOF presenting with wheezing and CP, hx of VTE on warfarin PTA, INR therapeutic on admission at 2.2 however recently subtherapeutic, and concern for PE with VQ scan negative. Plans are for cardiac cath when volume status is improved. Pharmacy consulted to start heparin and plans were to start when INR < 2.0  Now s/p RHC/LHC on heparin for TAVR evaluation -heparin level = 0.76 on 950 units/hr, no issues with infusion or overt s/sx of bleeding per RN, CBC stable  Goal of Therapy:  Heparin level 0.3-0.7 units/ml Monitor platelets by anticoagulation protocol: Yes   Plan:  -Decrease heparin infusion (~2 units/kg/hr) to 850 units/hr -Heparin level in 8 hours and daily wth CBC daily  Georga Bora, PharmD Clinical Pharmacist 08/14/2022 3:35 AM Please check AMION for all [place] numbers

## 2022-08-14 NOTE — Progress Notes (Addendum)
[place] for heparin, warfarin Indication:  NSTEMI   Patient Measurements: Height: '5\' 4"'$  (162.6 cm) Weight: 54.6 kg (120 lb 5.9 oz) IBW/kg (Calculated) : 54.7 Heparin Dosing Weight: TBW  Vital Signs: Temp: 97.6 F (36.4 C) (02/29 1351) Temp Source: Oral (02/29 1351) BP: 120/63 (02/29 1351) Pulse Rate: 74 (02/29 1351)  Labs: Recent Labs    08/01/2022 0213 07/20/2022 1209 08/01/2022 1410 08/09/2022 1415 08/11/2022 1415 08/13/22 0219 08/13/22 0732 08/13/22 1604 08/14/22 0203 08/14/22 1148  HGB 10.9*  --    < > 11.6*  --  11.5*  --   --  11.1*  --   HCT 32.7*  --    < > 34.0*  --  33.7*  --   --  33.0*  --   PLT 264  --   --   --   --  276  --   --  271  --   LABPROT 22.2* 20.6*  --   --   --  17.1*  --   --  17.0*  --   INR 2.0* 1.8*  --   --   --  1.4*  --   --  1.4*  --   HEPARINUNFRC  --   --   --   --    < > 0.24*  --  0.97* 0.76* 0.74*  CREATININE 1.23*  --   --   --   --   --  0.89  --  0.96  --    < > = values in this interval not displayed.     Estimated Creatinine Clearance: 36.3 mL/min (by C-G formula based on SCr of 0.96 mg/dL).  Assessment: 68 YOF presenting with wheezing and CP, hx of VTE on warfarin PTA, INR therapeutic on admission at 2.2 however recently subtherapeutic, and concern for PE with VQ scan negative. Plans are for cardiac cath when volume status is improved. Pharmacy consulted to start heparin and plans were to start when INR < 2.0  Now s/p RHC/LHC on heparin for TAVR evaluation -heparin level = 0.76 on 950 units/hr  Restarting warfarin today -INR= 1.4 -clinic dosing (08/05/22): 7.'5mg'$ /day except take '5mg'$  MW   Goal of Therapy:  Heparin level 0.3-0.7 units/ml Monitor platelets by anticoagulation protocol: Yes   Plan:  -Decrease heparin infusion to 800 units/hr -Heparin level daily wth CBC daily -'10mg'$  warfarin today -Daily PT/INR  Hildred Laser, PharmD Clinical Pharmacist **Pharmacist phone directory  can now be found on [email or web address] ([email or web address]).  Listed under [place].

## 2022-08-14 NOTE — Progress Notes (Addendum)
[facility] VALVE TEAM  Patient Name: Tracy Peters Date of Encounter: 08/14/2022  Primary Cardiologist: Werner Lean, MD    Hospital Problem List     Principal Problem:   Elevated troponin I level Active Problems:   Essential hypertension   NSTEMI (non-ST elevated myocardial infarction) (HCC)   Elevated d-dimer   Elevated troponin   Severe aortic stenosis   Vasovagal near syncope  Subjective   Patient feeling well today with no complaints. No chest pain, SOB, or recurrent pre-syncope.   Inpatient Medications    Scheduled Meds:  aspirin EC  81 mg Oral Daily   famotidine  20 mg Oral Daily   lacosamide  50 mg Oral BID   metoprolol succinate  25 mg Oral Daily   metoprolol tartrate       rosuvastatin  20 mg Oral Daily   sodium chloride flush  3 mL Intravenous Q12H   Continuous Infusions:  sodium chloride     heparin 850 Units/hr (08/14/22 0415)   PRN Meds: sodium chloride, acetaminophen, metoprolol tartrate, ondansetron (ZOFRAN) IV, sodium chloride flush, traMADol, zolpidem   Vital Signs    Vitals:   08/13/22 2012 08/14/22 0415 08/14/22 0417 08/14/22 0949  BP: (!) 123/57 126/71  135/82  Pulse: 89 78  80  Resp: 20 18    Temp: 97.7 F (36.5 C) 97.9 F (36.6 C)    TempSrc: Oral Oral    SpO2: 100% 100%    Weight:   54.6 kg   Height:       No intake or output data in the 24 hours ending 08/14/22 1015 Filed Weights   07/31/2022 1135 08/13/22 0600 08/14/22 0417  Weight: 52.2 kg 55.2 kg 54.6 kg    Physical Exam   General: Well developed, well nourished, NAD Neck: Negative for carotid bruits. No JVD Lungs:Clear to ausculation bilaterally. No wheezes, rales, or rhonchi. Breathing is unlabored. Cardiovascular: RRR with S1 S2. + murmur Abdomen: Soft, non-tender, non-distended. No obvious abdominal masses. Extremities: No edema. Neuro: Alert and oriented. No focal deficits. No facial asymmetry. MAE  spontaneously. Psych: Responds to questions appropriately with normal affect.    Labs    CBC Recent Labs    08/09/2022 0213 08/05/2022 1410 08/13/22 0219 08/14/22 0203  WBC 8.2  --  4.1 7.8  NEUTROABS 4.8  --   --   --   HGB 10.9*   < > 11.5* 11.1*  HCT 32.7*   < > 33.7* 33.0*  MCV 100.3*  --  98.8 99.1  PLT 264  --  276 271   < > = values in this interval not displayed.   Basic Metabolic Panel Recent Labs    07/31/2022 0213 07/24/2022 1410 08/13/22 0732 08/14/22 0203  NA 136   < > 138 135  K 3.6   < > 3.8 3.9  CL 99  --  103 99  CO2 26  --  23 24  GLUCOSE 107*  --  106* 134*  BUN 20  --  23 30*  CREATININE 1.23*  --  0.89 0.96  CALCIUM 8.7*  --  8.8* 8.5*  MG 1.7  --   --   --   PHOS 4.1  --   --   --    < > = values in this interval not displayed.   Liver Function Tests Recent Labs    07/20/2022 0213  AST 18  ALT 11  ALKPHOS  39  BILITOT 0.5  PROT 6.0*  ALBUMIN 3.0*   No results for input(s): "LIPASE", "AMYLASE" in the last 72 hours. Cardiac Enzymes No results for input(s): "CKTOTAL", "CKMB", "CKMBINDEX", "TROPONINI" in the last 72 hours. BNP Invalid input(s): "POCBNP" D-Dimer No results for input(s): "DDIMER" in the last 72 hours. Hemoglobin A1C No results for input(s): "HGBA1C" in the last 72 hours. Fasting Lipid Panel No results for input(s): "CHOL", "HDL", "LDLCALC", "TRIG", "CHOLHDL", "LDLDIRECT" in the last 72 hours. Thyroid Function Tests No results for input(s): "TSH", "T4TOTAL", "T3FREE", "THYROIDAB" in the last 72 hours.  Invalid input(s): "FREET3"  Telemetry    NSR with HR 70's; no evidence of bradycardia or SVT on review - Personally Reviewed  ECG    No new tracing as of 08/14/22  - Personally Reviewed  Radiology    CARDIAC CATHETERIZATION  Result Date: 07/22/2022 1.  Mild aorto ostial disease involving the ostial RCA and the ostial left main with less than 50% stenosis at both lesion sites 2.  Patent LAD, left circumflex, and RCA  without any high-grade stenoses 3.  Mild pulmonary hypertension with mean PA pressure 26 mmHg, mildly increased wedge pressure of 19 mmHg, and otherwise normal right heart hemodynamics.  Preserved cardiac output and index of 4.6 L/min and 2.99 L/min/m 4.  Known severe calcific aortic stenosis with heavy calcification and restriction of the aortic valve leaflets seen on plain fluoroscopy Recommendations: Resume heparin in 8 hours.  Continue TAVR evaluation with CT angiogram studies and formal cardiac surgical consultation as previously outlined    Cardiac Studies   Echocardiogram 08/11/22:   1. Left ventricular ejection fraction, by estimation, is 55 to 60%. The  left ventricle has normal function. The left ventricle has no regional  wall motion abnormalities. Left ventricular diastolic function could not  be evaluated. There is severe  hypokinesis of the left ventricular, basal-mid inferoseptal wall and  inferior wall.   2. Right ventricular systolic function is normal. The right ventricular  size is normal. There is normal pulmonary artery systolic pressure. The  estimated right ventricular systolic pressure is [id number] mmHg.   3. The mitral valve is degenerative. Mild mitral valve regurgitation. No  evidence of mitral stenosis. Moderate mitral annular calcification.   4. The aortic valve is calcified. There is severe calcifcation of the  aortic valve. There is severe thickening of the aortic valve. Aortic valve  regurgitation is not visualized. Severe aortic valve stenosis. Aortic  valve area, by VTI measures 0.46 cm.  Aortic valve mean gradient measures 18.0 mmHg. Aortic valve Vmax measures  3.02 m/s. Although the mean AVG is mild Vmax is not in the severe range,  the SVI is low at 15 and DVI 0.18. These findings are consistent with  paradoxical low flow low gradient  severe AS.   5. The inferior vena cava is normal in size with greater than 50%  respiratory variability, suggesting right  atrial pressure of 3 mmHg.  R/LHC 07/30/2022:   1.  Mild aorto ostial disease involving the ostial RCA and the ostial left main with less than 50% stenosis at both lesion sites 2.  Patent LAD, left circumflex, and RCA without any high-grade stenoses 3.  Mild pulmonary hypertension with mean PA pressure 26 mmHg, mildly increased wedge pressure of 19 mmHg, and otherwise normal right heart hemodynamics.  Preserved cardiac output and index of 4.6 L/min and 2.99 L/min/m 4.  Known severe calcific aortic stenosis with heavy calcification and restriction of the aortic valve  leaflets seen on plain fluoroscopy   Recommendations: Resume heparin in 8 hours.  Continue TAVR evaluation with CT angiogram studies and formal cardiac surgical consultation as previously outlined  Patient Profile     89 y.o. female with a history of HTN, HLD, PAD, prior DVT, and mild carotid disease who was seen for the evaluation of severe aortic stenosis and possible. Patient presented with worsening chest pain and shortness of breath. Found to have acute heart failure exacerbation in the setting of severe AS. Ongoing TAVR workup    Assessment & Plan    Severe AS: Patient seen for further evaluation of severe AS. R/LHC showed mild osital RCA disease with ostial LM disease at <50%, patent LAD, LCX with no high grade stenosis. Echocardiogram notable for normal LV systolic function and paradoxical low flow low gradient severe aortic stenosis with peak gradient of 36.63mHg and mean transvalvular gradient of 174mg. The patient's dimensionless index is 0.18 and calculated aortic valve area is 0.46 cm. Underwent pre TAVR CT imaging today and will upload these for industry review. Seen by [facility] yesterday with plans to move forward with TAVR 08/26/22. No option to move this closer. Episode of what sounds like vasovagal response to ambulation yesterday. Tele reviewed with no alerts, bradycardia, or SVT noted. No recurrent episodes. Dr. McAngelena Formto review imaging and see later today. Continue ASA. Would aim to keep BP on the higher with AS.   Vasovagal event?/AS driven: Pt noted to be tachycardiac on telemetry while working with PT. She became minimally responsive, pale with tele showing sinus bradycardia with HR in the 40s. She was diaphoretic and nauseated, very lethargic. Then had an episode of SVT with rates in the 150s. Return of sinus rhythm in the 80s and no recurrence.  HTN: Stable with no changes needed at this time. Continue Toprol '25mg'$  QD  HLD: Continue statin  Prior DVT: On long term Coumadin therapy, no bridged with IV Heparin. INR today, 1.4  Signed, JiKathyrn DrownNP  08/14/2022, 10:15 AM  Pager [phone] have personally seen and examined this patient. I agree with the assessment and plan as outlined above.  She is feeling better today. No further episodes of dizziness today. Likely vasovagal response with ambulation yesterday. Tele with sinus today.  Pre TAVR CT scans today with formal reading pending.  Her TAVR will be planned for 08/26/22.  She can likely be discharged home tomorrow if no further near syncopal events.   ChLauree ChandlerMD, FASeymour Hospital/29/2024 12:55 PM

## 2022-08-14 NOTE — Progress Notes (Addendum)
Rounding Note    Patient Name: Jurlean Bickell Date of Encounter: 08/15/2022  [place] Cardiologist: Werner Lean, MD   Subjective   Patient reports that she is doing well this AM. She would prefer to have TAVR sooner, but understands that it is scheduled for 3/12. Prior to being admitted to the hospital, patient was fairly independent. She drove a car, walked her dogs, and could do chores around her house. Patient is a bit anxious to go home, but plans to be very cautious when she does go home.   She has not had recurrence of near syncope, chest pain, or racing heart beat since walking with PT on 2/28.   Inpatient Medications    Scheduled Meds:  aspirin EC  81 mg Oral Daily   famotidine  20 mg Oral Daily   lacosamide  50 mg Oral BID   metoprolol succinate  25 mg Oral Daily   rosuvastatin  20 mg Oral Daily   sodium chloride flush  3 mL Intravenous Q12H   Warfarin - Pharmacist Dosing Inpatient   Does not apply q1600   Continuous Infusions:  sodium chloride     heparin 800 Units/hr (08/15/22 0604)   PRN Meds: sodium chloride, acetaminophen, ondansetron (ZOFRAN) IV, sodium chloride flush, traMADol, zolpidem   Vital Signs    Vitals:   08/14/22 1351 08/14/22 2226 08/15/22 0434 08/15/22 0854  BP: 120/63 132/74 115/70 120/70  Pulse: 74 82 70 78  Resp: '16 18 16   '$ Temp: 97.6 F (36.4 C) 97.6 F (36.4 C) (!) 97.5 F (36.4 C)   TempSrc: Oral Oral Oral   SpO2: 99% 100% 99%   Weight:   54.9 kg   Height:        Intake/Output Summary (Last 24 hours) at 08/15/2022 0940 Last data filed at 08/15/2022 0855 Gross per 24 hour  Intake 3 ml  Output --  Net 3 ml      08/15/2022    4:34 AM 08/14/2022    4:17 AM 08/13/2022    6:00 AM  Last 3 Weights  Weight (lbs) 121 lb 0.5 oz 120 lb 5.9 oz 121 lb 11.2 oz  Weight (kg) 54.9 kg 54.6 kg 55.203 kg      Telemetry    Normal sinus rhythm, HR 70s - Personally Reviewed  ECG    No new tracings since 2/27 -  Personally Reviewed  Physical Exam   GEN: No acute distress. Laying flat in the bed with head elevated  Neck: No JVD Cardiac: RRR, grade 4/6 systolic murmur throughout. Right radial cath site stable  Respiratory: Clear to auscultation bilaterally. Normal WOB on room air  GI: Soft, nontender, non-distended MS: No edema in BLE; No deformity. Left hip is tender to palpation with faint bruising noted  Neuro:  Nonfocal  Psych: Normal affect   Labs    High Sensitivity Troponin:   Recent Labs  Lab 07/21/2022 0900 08/03/2022 1056 08/01/2022 1616 07/31/2022 1743  TROPONINIHS 199* 189* 241* 250*     Chemistry Recent Labs  Lab 07/27/2022 1056 08/11/22 0541 07/26/2022 0213 07/20/2022 1410 08/04/2022 1415 08/13/22 0732 08/14/22 0203  NA  --    < > 136   < > 137 138 135  K  --    < > 3.6   < > 3.5 3.8 3.9  CL  --    < > 99  --   --  103 99  CO2  --    < >  26  --   --  23 24  GLUCOSE  --    < > 107*  --   --  106* 134*  BUN  --    < > 20  --   --  23 30*  CREATININE  --    < > 1.23*  --   --  0.89 0.96  CALCIUM  --    < > 8.7*  --   --  8.8* 8.5*  MG 1.8  --  1.7  --   --   --   --   PROT  --   --  6.0*  --   --   --   --   ALBUMIN  --   --  3.0*  --   --   --   --   AST  --   --  18  --   --   --   --   ALT  --   --  11  --   --   --   --   ALKPHOS  --   --  39  --   --   --   --   BILITOT  --   --  0.5  --   --   --   --   GFRNONAA  --    < > 43*  --   --  >60 58*  ANIONGAP  --    < > 11  --   --  12 12   < > = values in this interval not displayed.    Lipids  Recent Labs  Lab 08/11/22 0541  CHOL 165  TRIG 65  HDL 48  LDLCALC 104*  CHOLHDL 3.4    Hematology Recent Labs  Lab 08/13/22 0219 08/14/22 0203 08/15/22 0431  WBC 4.1 7.8 11.3*  RBC 3.41* 3.33* 2.99*  HGB 11.5* 11.1* 10.0*  HCT 33.7* 33.0* 30.1*  MCV 98.8 99.1 100.7*  MCH 33.7 33.3 33.4  MCHC 34.1 33.6 33.2  RDW 13.0 12.9 12.9  PLT 276 271 257   Thyroid No results for input(s): "TSH", "FREET4" in the last 168  hours.  BNP Recent Labs  Lab 08/09/2022 0900  BNP 1,585.6*    DDimer  Recent Labs  Lab 07/25/2022 0900  DDIMER 3.00*     Radiology    CT ANGIO ABDOMEN PELVIS  W &/OR WO CONTRAST  Result Date: 08/15/2022 CLINICAL DATA:  89 year old female with history of severe aortic stenosis. Preprocedural study prior to potential transcatheter aortic valve replacement (TAVR) procedure. EXAM: CT ANGIOGRAPHY CHEST, ABDOMEN AND PELVIS TECHNIQUE: Multidetector CT imaging through the chest, abdomen and pelvis was performed using the standard protocol during bolus administration of intravenous contrast. Multiplanar reconstructed images and MIPs were obtained and reviewed to evaluate the vascular anatomy. RADIATION DOSE REDUCTION: This exam was performed according to the departmental dose-optimization program which includes automated exposure control, adjustment of the mA and/or kV according to patient size and/or use of iterative reconstruction technique. CONTRAST:  1105m OMNIPAQUE IOHEXOL 350 MG/ML SOLN COMPARISON:  None Available. FINDINGS: CTA CHEST FINDINGS Cardiovascular: Heart size is normal. There is no significant pericardial fluid, thickening or pericardial calcification. There is aortic atherosclerosis, as well as atherosclerosis of the great vessels of the mediastinum and the coronary arteries, including calcified atherosclerotic plaque in the left main coronary artery. Severe thickening and calcification of the aortic valve. Mediastinum/Lymph Nodes: No pathologically enlarged mediastinal or hilar lymph nodes. Esophagus is unremarkable in  appearance. No axillary lymphadenopathy. Lungs/Pleura: Small to moderate bilateral pleural effusions lying dependently with passive areas of subsegmental atelectasis in the lungs bilaterally. No confluent consolidative airspace disease. No definite suspicious appearing pulmonary nodules or masses are noted. Musculoskeletal/Soft Tissues: Orthopedic fixation hardware in the  cervical spine incidentally noted. There are no aggressive appearing lytic or blastic lesions noted in the visualized portions of the skeleton. CTA ABDOMEN AND PELVIS FINDINGS Hepatobiliary: No suspicious cystic or solid hepatic lesions. No intra or extrahepatic biliary ductal dilatation. Status post cholecystectomy. Pancreas: No pancreatic mass. No pancreatic ductal dilatation. No pancreatic or peripancreatic fluid collections or inflammatory changes. Spleen: Unremarkable. Adrenals/Urinary Tract: Severe atrophy of the left kidney. Multifocal cortical scarring in both kidneys. Right kidney and bilateral adrenal glands are otherwise unremarkable in appearance. No hydroureteronephrosis. Urinary bladder is normal in appearance. Stomach/Bowel: The appearance of the stomach is normal. There is no pathologic dilatation of small bowel or colon. The appendix is not confidently identified and may be surgically absent. Regardless, there are no inflammatory changes noted adjacent to the cecum to suggest the presence of an acute appendicitis at this time. Vascular/Lymphatic: Atherosclerosis throughout the abdominal and pelvic vasculature, with vascular findings and measurements pertinent to potential TAVR procedure, as detailed below. No lymphadenopathy noted in the abdomen or pelvis. Reproductive: Status post hysterectomy.  Ovaries are atrophic. Other: No significant volume of ascites.  No pneumoperitoneum. Musculoskeletal: Status post right hip arthroplasty. There are no aggressive appearing lytic or blastic lesions noted in the visualized portions of the skeleton. VASCULAR MEASUREMENTS PERTINENT TO TAVR: AORTA: Minimal Aortic Diameter-13 x 12 mm Severity of Aortic Calcification-moderate to severe RIGHT PELVIS: Right Common Iliac Artery - Minimal Diameter-5.5 x 6.7 mm Tortuosity-moderate Calcification-moderate Right External Iliac Artery - Minimal Diameter-5.6 x 6.0 mm Tortuosity-mild Calcification-none Right Common Femoral  Artery - Minimal Diameter-4.8 x 5.8 mm Tortuosity-mild Calcification-mild LEFT PELVIS: Left Common Iliac Artery - Minimal Diameter-5.5 x 6.3 mm Tortuosity-severe Calcification-moderate Left External Iliac Artery - Minimal Diameter-6.2 x 5.7 mm Tortuosity-mild Calcification-mild Left Common Femoral Artery - Minimal Diameter-5.1 x 5.1 mm Tortuosity-mild Calcification-mild Review of the MIP images confirms the above findings. IMPRESSION: 1. Vascular findings and measurements pertinent to potential TAVR procedure, as detailed above. 2. Severe thickening and calcification of the aortic valve, compatible with reported clinical history of severe aortic stenosis. 3. Small to moderate bilateral pleural effusions with areas of passive subsegmental atelectasis throughout the lungs bilaterally. 4. Aortic atherosclerosis, in addition to left main coronary artery disease. 5. Additional incidental findings, as above. Electronically Signed   By: Vinnie Langton M.D.   On: 08/15/2022 07:53   CT ANGIO CHEST AORTA W/CM & OR WO/CM  Result Date: 08/15/2022 CLINICAL DATA:  89 year old female with history of severe aortic stenosis. Preprocedural study prior to potential transcatheter aortic valve replacement (TAVR) procedure. EXAM: CT ANGIOGRAPHY CHEST, ABDOMEN AND PELVIS TECHNIQUE: Multidetector CT imaging through the chest, abdomen and pelvis was performed using the standard protocol during bolus administration of intravenous contrast. Multiplanar reconstructed images and MIPs were obtained and reviewed to evaluate the vascular anatomy. RADIATION DOSE REDUCTION: This exam was performed according to the departmental dose-optimization program which includes automated exposure control, adjustment of the mA and/or kV according to patient size and/or use of iterative reconstruction technique. CONTRAST:  180m OMNIPAQUE IOHEXOL 350 MG/ML SOLN COMPARISON:  None Available. FINDINGS: CTA CHEST FINDINGS Cardiovascular: Heart size is normal.  There is no significant pericardial fluid, thickening or pericardial calcification. There is aortic atherosclerosis, as well as atherosclerosis of  the great vessels of the mediastinum and the coronary arteries, including calcified atherosclerotic plaque in the left main coronary artery. Severe thickening and calcification of the aortic valve. Mediastinum/Lymph Nodes: No pathologically enlarged mediastinal or hilar lymph nodes. Esophagus is unremarkable in appearance. No axillary lymphadenopathy. Lungs/Pleura: Small to moderate bilateral pleural effusions lying dependently with passive areas of subsegmental atelectasis in the lungs bilaterally. No confluent consolidative airspace disease. No definite suspicious appearing pulmonary nodules or masses are noted. Musculoskeletal/Soft Tissues: Orthopedic fixation hardware in the cervical spine incidentally noted. There are no aggressive appearing lytic or blastic lesions noted in the visualized portions of the skeleton. CTA ABDOMEN AND PELVIS FINDINGS Hepatobiliary: No suspicious cystic or solid hepatic lesions. No intra or extrahepatic biliary ductal dilatation. Status post cholecystectomy. Pancreas: No pancreatic mass. No pancreatic ductal dilatation. No pancreatic or peripancreatic fluid collections or inflammatory changes. Spleen: Unremarkable. Adrenals/Urinary Tract: Severe atrophy of the left kidney. Multifocal cortical scarring in both kidneys. Right kidney and bilateral adrenal glands are otherwise unremarkable in appearance. No hydroureteronephrosis. Urinary bladder is normal in appearance. Stomach/Bowel: The appearance of the stomach is normal. There is no pathologic dilatation of small bowel or colon. The appendix is not confidently identified and may be surgically absent. Regardless, there are no inflammatory changes noted adjacent to the cecum to suggest the presence of an acute appendicitis at this time. Vascular/Lymphatic: Atherosclerosis throughout the  abdominal and pelvic vasculature, with vascular findings and measurements pertinent to potential TAVR procedure, as detailed below. No lymphadenopathy noted in the abdomen or pelvis. Reproductive: Status post hysterectomy.  Ovaries are atrophic. Other: No significant volume of ascites.  No pneumoperitoneum. Musculoskeletal: Status post right hip arthroplasty. There are no aggressive appearing lytic or blastic lesions noted in the visualized portions of the skeleton. VASCULAR MEASUREMENTS PERTINENT TO TAVR: AORTA: Minimal Aortic Diameter-13 x 12 mm Severity of Aortic Calcification-moderate to severe RIGHT PELVIS: Right Common Iliac Artery - Minimal Diameter-5.5 x 6.7 mm Tortuosity-moderate Calcification-moderate Right External Iliac Artery - Minimal Diameter-5.6 x 6.0 mm Tortuosity-mild Calcification-none Right Common Femoral Artery - Minimal Diameter-4.8 x 5.8 mm Tortuosity-mild Calcification-mild LEFT PELVIS: Left Common Iliac Artery - Minimal Diameter-5.5 x 6.3 mm Tortuosity-severe Calcification-moderate Left External Iliac Artery - Minimal Diameter-6.2 x 5.7 mm Tortuosity-mild Calcification-mild Left Common Femoral Artery - Minimal Diameter-5.1 x 5.1 mm Tortuosity-mild Calcification-mild Review of the MIP images confirms the above findings. IMPRESSION: 1. Vascular findings and measurements pertinent to potential TAVR procedure, as detailed above. 2. Severe thickening and calcification of the aortic valve, compatible with reported clinical history of severe aortic stenosis. 3. Small to moderate bilateral pleural effusions with areas of passive subsegmental atelectasis throughout the lungs bilaterally. 4. Aortic atherosclerosis, in addition to left main coronary artery disease. 5. Additional incidental findings, as above. Electronically Signed   By: Vinnie Langton M.D.   On: 08/15/2022 07:53   CT CORONARY MORPH W/CTA COR W/SCORE W/CA W/CM &/OR WO/CM  Addendum Date: 08/15/2022   ADDENDUM REPORT: 08/15/2022  06:57 ADDENDUM: Extracardiac findings will be described separately under dictation for contemporaneously obtained CTA chest, abdomen and pelvis dated 08/14/2022. Please see that dictation for full description of relevant extracardiac findings. Electronically Signed   By: Vinnie Langton M.D.   On: 08/15/2022 06:57   Result Date: 08/15/2022 CLINICAL DATA:  Aortic Valve pathology with assessment for TAVR EXAM: Cardiac TAVR CT TECHNIQUE: The patient was scanned on a Siemens Force [id number] slice scanner. A 120 kV retrospective scan was triggered in the descending thoracic aorta at 111 HU's.  Gantry rotation speed was 270 msecs and collimation was .9 mm. No beta blockade or nitro were given. The 3D data set was reconstructed in 5% intervals of the R-R cycle. Systolic and diastolic phases were analyzed on a dedicated work station using MPR, MIP and VRT modes. The patient received 100 cc of contrast. FINDINGS: Aortic Valve: Severely thickened aortic valve with heavy calcification and reduced excursion the planimeter valve area is 0.741 Sq cm consistent with critical aortic stenosis Number of leaflets: Three LVOT calcification: None Annular calcification: None Aortic Valve Calcium Score: 4115 Perimembranous septal diameter: 5 mm Mitral Valve: No stenosis without significant MAC Aortic Annulus Measurements- 30% Phase Major annulus diameter: 24 mm Minor annulus diameter: 21 mm Annular perimeter: 70 mm Annular area: 3.80 cm2 Aortic Root Measurements Sinotubular Junction: 27 mm Ascending Thoracic Aorta: 35 mm Aortic Arch: 28 mm Descending Thoracic Aorta: 23 mm Aortic atherosclerosis. Sinus of Valsalva Measurements: Right coronary cusp width: 28 mm Left coronary cusp width: 29 mm Non coronary cusp width: 28 mm Coronary Artery Height above Annulus: Left Main: 15 mm Left SoV height: 21 mm Right Coronary: 15 mm Right SoV height: 21 mm Optimum Fluoroscopic Angle for Delivery: LAO 14, CAU 11 Cusp overlay view angle:RAO 0, CAU 27 Valves  for structural team consideration: 23 mm Sapien Valve There are sufficient sinus dimensions to a 26 mm Core Valve Non TAVR Valve Findings: Coronary Arteries: Normal coronary origin. Study not completed with nitroglycerin. Coronary Calcium Score: Left main: 168 Left anterior descending artery: 1 Left circumflex artery: 1 Right coronary artery: 215 Total: 385 Systemic veins: Normal drainage Main Pulmonary artery: Normal dimensions Pulmonary veins: Normal variant anatomy-presence of a right middle pulmonary vein Left atrial appendage: Decreased filling in the distal tip of the left atrium; more consistent delayed filling that true thrombus. Interatrial septum: No communications. Extra Cardiac Findings as per separate reporting. IMPRESSION: 1. Critical Aortic stenosis. Findings pertinent to TAVR procedure are detailed above. RECOMMENDATIONS: The proposed cut-off value of 1,651 AU yielded a 93 % sensitivity and 75 % specificity in grading AS severity in patients with classical low-flow, low-gradient AS. Proposed different cut-off values to define severe AS for men and women as 2,065 AU and 1,274 AU, respectively. The joint European and American recommendations for the assessment of AS consider the aortic valve calcium score as a continuum - a very high calcium score suggests severe AS and a low calcium score suggests severe AS is unlikely. Kerman Passey, et al. 2017 ESC/EACTS Guidelines for the management of valvular heart disease. Eur Heart J [phone] Coronary artery calcium (CAC) score is a strong predictor of incident coronary heart disease (CHD) and provides predictive information beyond traditional risk factors. CAC scoring is reasonable to use in the decision to withhold, postpone, or initiate statin therapy in intermediate-risk or selected borderline-risk asymptomatic adults (age 51-75 years and LDL-C >=70 to <190 mg/dL) who do not have diabetes or established atherosclerotic cardiovascular  disease (ASCVD).* In intermediate-risk (10-year ASCVD risk >=7.5% to <20%) adults or selected borderline-risk (10-year ASCVD risk >=5% to <7.5%) adults in whom a CAC score is measured for the purpose of making a treatment decision the following recommendations have been made: If CAC = 0, it is reasonable to withhold statin therapy and reassess in 5 to 10 years, as long as higher risk conditions are absent (diabetes mellitus, family history of premature CHD in first degree relatives (males <55 years; females <65 years), cigarette smoking, LDL >=190 mg/dL or other  independent risk factors). If CAC is 1 to 99, it is reasonable to initiate statin therapy for patients >=94 years of age. If CAC is >=100 or >=75th percentile, it is reasonable to initiate statin therapy at any age. Cardiology referral should be considered for patients with CAC scores >=400 or >=75th percentile. *2018 AHA/ACC/AACVPR/AAPA/ABC/ACPM/ADA/AGS/APhA/ASPC/NLA/PCNA Guideline on the Management of Blood Cholesterol: A Report of the American College of Cardiology/American Heart Association Task Force on Clinical Practice Guidelines. J Am Coll Cardiol. 2019;73(24):3168-3209. Mahesh  Chandrasekhar Electronically Signed: By: Rudean Haskell M.D. On: 08/14/2022 18:03    Cardiac Studies   Echocardiogram 08/11/22  1. Left ventricular ejection fraction, by estimation, is 55 to 60%. The  left ventricle has normal function. The left ventricle has no regional  wall motion abnormalities. Left ventricular diastolic function could not  be evaluated. There is severe  hypokinesis of the left ventricular, basal-mid inferoseptal wall and  inferior wall.   2. Right ventricular systolic function is normal. The right ventricular  size is normal. There is normal pulmonary artery systolic pressure. The  estimated right ventricular systolic pressure is [id number] mmHg.   3. The mitral valve is degenerative. Mild mitral valve regurgitation. No  evidence of  mitral stenosis. Moderate mitral annular calcification.   4. The aortic valve is calcified. There is severe calcifcation of the  aortic valve. There is severe thickening of the aortic valve. Aortic valve  regurgitation is not visualized. Severe aortic valve stenosis. Aortic  valve area, by VTI measures 0.46 cm.  Aortic valve mean gradient measures 18.0 mmHg. Aortic valve Vmax measures  3.02 m/s. Although the mean AVG is mild Vmax is not in the severe range,  the SVI is low at 15 and DVI 0.18. These findings are consistent with  paradoxical low flow low gradient  severe AS.   5. The inferior vena cava is normal in size with greater than 50%  respiratory variability, suggesting right atrial pressure of 3 mmHg.   Right/Left Heart Catheterization 07/27/2022 1.  Mild aorto ostial disease involving the ostial RCA and the ostial left main with less than 50% stenosis at both lesion sites 2.  Patent LAD, left circumflex, and RCA without any high-grade stenoses 3.  Mild pulmonary hypertension with mean PA pressure 26 mmHg, mildly increased wedge pressure of 19 mmHg, and otherwise normal right heart hemodynamics.  Preserved cardiac output and index of 4.6 L/min and 2.99 L/min/m 4.  Known severe calcific aortic stenosis with heavy calcification and restriction of the aortic valve leaflets seen on plain fluoroscopy   Recommendations: Resume heparin in 8 hours.  Continue TAVR evaluation with CT angiogram studies and formal cardiac surgical consultation as previously outlined  Patient Profile     89 y.o. female with a past medical history of HTN, HLD, PAD, prior DVT, mild carotid disease. Patient presented with worsening chest pain and shortness of breath. Found to have acute heart failure exacerbation in the setting of severe AS. Ongoing TAVR workup   Assessment & Plan    Severe Paradoxical Aortic Stenosis  - Echocardiogram this admission showed severe calcification of the aortic valve with severe  stenosis. Mean gradient 18.0 mmHg. Vmax 3.02 m/s. Findings were consistent with paradoxical low flow low gradient severe AS - R/L heart catheterization this admission showed severe calcific aortic stenosis with heavy calcification and restriction of the aortic valve leaflets  - Patient is being evaluated by the structural heart team with plans for TAVR on 3/12. Underwent CT scan yesterday. -  Patient had chest pain/near syncope while walking in the halls on 2/28 and had an episode of SVT. Structural heart team saw patient yesterday, they continue to plan for TAVR on 3/12. Patient understands that if she is discharged, she has to be very cautious at home and limit her physical activity. Discussed ED return precautions  - Patient did have an episode of chest pain on 2/26 and was given SL nitro. Asking if she can have SL nitro for home, but she should not be given a prescription given concerns for hypotension with severe AS.   SVT  - On 2/28, patient was working with PT when she became tachycardic on telemetry. She went back to her room and became minimally responsive, HR decreased to the 40s. Then, she had an episode of SVT with HR to the 150s  - Per telemetry, patient has not had recurrence of SVT and is maintaining NSR   Acute on Chronic Diastolic Heart Failure  - Decompensation driven by severe AS  - Echo this admission with EF 55-60%, severe AS as above  - RHC 2/27 with wedge 19 - She was on IV lasix earlier this admission, but has been off lasix since 2/27. She is euvolemic on exam today  - Continue metoprolol succinate 25 mg daily   NSTEMI  - hsTn [phone]  - Cardiac catheterization on 2/27 showed mild aorto ostial disease involving the ostial RCA and the ostial left main with less than 50% stenosis at both lesion sites. LAD, left circumflex, and RCA were without high-grade stenoses.  - Suspect troponin elevation is demand ischemia in the setting of CHF and AS  - Continue daily ASA,  crestor 20 mg daily, metoprolol succinate 25 mg daily   HTN  - BP well controlled on metoprolol - Holding losartan to prevent hypotension given severe AS   History of DVT  - Patient was on coumadin PTA  - While admitted, patient has been on IV heparin. INR 1.4 this AM  - Will discuss with MD and structural heart team plans for anticoagulation prior to TAVR. Patient has never been on a DOAC   For questions or updates, please contact [place] Please consult [email or web address] for contact info under        Signed, Margie Billet, PA-C  08/15/2022, 9:40 AM    Patient seen and examined and agree with Vikki Ports, PA-C as detailed above.  In brief, the patient is a 89 y.o. female with a past medical history of HTN, HLD, PAD, prior DVT, and mild carotid disease who presented to the ER with chest pain and SOB found to have acute systolic HF exacerbation and newly diagnosed severe AS for which Cardiology was consulted.   Patient currently undergoing TAVR work-up. Cath without obstructive disease. Volume status markedly improved with IV lasix. Had an episode of near syncope during ambulation (? Vagal event as HR dropped to 40s and she felt very nasueas/diaphoretic) that has not recurred. She is hesitant to go home as she is afraid to ambulate due to the episode she had several days ago. Long discussion held with patient and her husband and they are agreeable to walk some today with the walker to ensure no recurrent symptoms. If stable, will likely discharge home tomorrow.  Of note, will change warfarin to apixaban for ease of dosing (on it for history of DVT) with plans to hold 5 days prior to her TAVR.  GEN: No acute distress.   Neck: No JVD Cardiac:  RRR, 3/6 harsh systolic murmur Respiratory: Clear to auscultation bilaterally. GI: Soft, nontender, non-distended  MS: No edema; No deformity. Neuro:  Nonfocal  Psych: Normal affect    Plan: -Ambulate today and ensure no  recurrent presyncopal events -If stable with ambulation, likely home tomorrow -Will start lasix '20mg'$  PO daily for maintenance diuretic -Continue metop '25mg'$  XL daily -Change warfarin to apixaban for ease of dosing (on it for history of DVT) -Tentatively planned for TAVR 3/12; will plan to hold apixaban 5 days prior to the procedure per their protocol  Gwyndolyn Kaufman, MD

## 2022-08-14 NOTE — Progress Notes (Signed)
PROGRESS NOTE    Tracy Peters  [id number] DOB: 12/06/1933 DOA: 08/01/2022 PCP: Cassandria Anger, MD  Chief Complaint  Patient presents with   Chest Pain   Wheezing    Brief Narrative:   Tracy Peters is a 89 y.o. female past medical history of DVT on Coumadin, peripheral vascular disease & hypertension who presented to the ED on 2/25 for shortness of breath x 2 weeks, progressively worsening as well as 2 days of chest pain at rest.  The emergency room, patient found to have new onset heart failure with a BNP of 1500 and chest x-ray noting pulmonary edema.  Patient admitted for treatment of new onset acute congestive heart failure.  Cardiology consulted.  Workup revealed severe aortic stenosis.  Patient underwent left and right heart catheterization revealing mild pulmonary hypertension and confirming severe calcific aortic stenosis.  CT surgery consulted and are recommending TAVR.  Unfortunately, this cannot be done until 3/12.  Patient seen by cardiology and if patient is comfortable, will plan to discharge on 3/1 with return to hospital for surgery.  Assessment & Plan:   Principal Problem:   Elevated troponin I level Active Problems:   Essential hypertension   NSTEMI (non-ST elevated myocardial infarction) (HCC)   Elevated d-dimer   Elevated troponin   Severe aortic stenosis   Vasovagal near syncope  Shortness of Breath HFpEF  secondary to severe aortic Stenosis Appreciate cardiology help.  Continue diuresis.  Status post left and right heart catheterization.  Due to surgery recommending TAVR, which cannot be done until 3/12.  Chest Pain  NSTEMI  Elevated Troponin Appreciate cardiology recommendations.  Elevated troponins initially thought to be demand ischemia in the setting of heart failure and aortic stenosis, but wall motion abnormalities on 2D echo concerning for underlying CAD.  Status post left and right heart catheterization with no significant  coronary lesions noted.  Continue heparin infusion plus aspirin.  Continue Crestor, LDL at 104 with target below 70.  Continue antihypertensives   Essential hypertension Metoprolol, losartan  Hx DVT Heparin while warfarin on hold  INR 1.8 Heparin gtt while INR <2  Abnormal CXR No infectious symptoms, monitor - suspect related to HF above Repeat as indicated   DVT prophylaxis: heparin gtt Code Status: full Family Communication: Will call family Disposition:   Status is: Inpatient Remains inpatient appropriate because: -Therapeutic INR   Consultants:  Cardiology Cardiothoracic surgery  Procedures:  Status post left and right heart catheterization  Echo IMPRESSIONS     1. Left ventricular ejection fraction, by estimation, is 55 to 60%. The  left ventricle has normal function. The left ventricle has no regional  wall motion abnormalities. Left ventricular diastolic function could not  be evaluated. There is severe  hypokinesis of the left ventricular, basal-mid inferoseptal wall and  inferior wall.   2. Right ventricular systolic function is normal. The right ventricular  size is normal. There is normal pulmonary artery systolic pressure. The  estimated right ventricular systolic pressure is Q000111Q mmHg.   3. The mitral valve is degenerative. Mild mitral valve regurgitation. No  evidence of mitral stenosis. Moderate mitral annular calcification.   4. The aortic valve is calcified. There is severe calcifcation of the  aortic valve. There is severe thickening of the aortic valve. Aortic valve  regurgitation is not visualized. Severe aortic valve stenosis. Aortic  valve area, by VTI measures 0.46 cm.  Aortic valve mean gradient measures 18.0 mmHg. Aortic valve Vmax measures  3.02 m/s. Although  the mean AVG is mild Vmax is not in the severe range,  the SVI is low at 15 and DVI 0.18. These findings are consistent with  paradoxical low flow low gradient  severe AS.   5. The  inferior vena cava is normal in size with greater than 50%  respiratory variability, suggesting right atrial pressure of 3 mmHg.   Antimicrobials:  Anti-infectives (From admission, onward)    Start     Dose/Rate Route Frequency Ordered Stop   08/04/2022 1145  cefTRIAXone (ROCEPHIN) 1 g in sodium chloride 0.9 % 100 mL IVPB        1 g 200 mL/hr over 30 Minutes Intravenous  Once 08/06/2022 1132 08/03/2022 1313   07/29/2022 1145  doxycycline (VIBRA-TABS) tablet 100 mg        100 mg Oral  Once 07/25/2022 1132 08/09/2022 1236       Subjective: Tired, no complaints  Objective: Vitals:   08/14/22 0415 08/14/22 0417 08/14/22 0949 08/14/22 1351  BP: 126/71  135/82 120/63  Pulse: 78  80 74  Resp: 18   16  Temp: 97.9 F (36.6 C)   97.6 F (36.4 C)  TempSrc: Oral   Oral  SpO2: 100%   99%  Weight:  54.6 kg    Height:       No intake or output data in the 24 hours ending 08/14/22 1452  Filed Weights   07/29/2022 1135 08/13/22 0600 08/14/22 0417  Weight: 52.2 kg 55.2 kg 54.6 kg    Examination:  General: Alert and oriented x 3, no acute distress. Cardiovascular: Regular rate and rhythm, S1-S2, 3 out of 6 systolic ejection murmur Lungs: Poor inspiratory effort, otherwise clear to auscultation bilaterally Abdomen: Soft, nontender, nondistended, positive bowel sounds Neurological: No focal deficits Extremities: No clubbing or cyanosis.  Trace pitting edema.   Data Reviewed: I have personally reviewed following labs and imaging studies  CBC: Recent Labs  Lab 08/07/2022 0900 08/11/22 0541 07/24/2022 0213 07/22/2022 1410 08/09/2022 1411 08/13/2022 1415 08/13/22 0219 08/14/22 0203  WBC 7.1 7.3 8.2  --   --   --  4.1 7.8  NEUTROABS  --   --  4.8  --   --   --   --   --   HGB 12.3 11.0* 10.9* 11.2* 11.9* 11.6* 11.5* 11.1*  HCT 37.1 32.5* 32.7* 33.0* 35.0* 34.0* 33.7* 33.0*  MCV 99.5 99.1 100.3*  --   --   --  98.8 99.1  PLT 240 257 264  --   --   --  276 271     Basic Metabolic Panel: Recent  Labs  Lab 08/06/2022 0900 08/13/2022 1056 08/11/22 0541 08/11/2022 0213 07/17/2022 1410 08/05/2022 1411 07/26/2022 1415 08/13/22 0732 08/14/22 0203  NA 135  --  135 136 139 139 137 138 135  K 3.9  --  3.6 3.6 3.5 3.6 3.5 3.8 3.9  CL 101  --  100 99  --   --   --  103 99  CO2 21*  --  24 26  --   --   --  23 24  GLUCOSE 94  --  100* 107*  --   --   --  106* 134*  BUN 15  --  15 20  --   --   --  23 30*  CREATININE 0.99  --  1.08* 1.23*  --   --   --  0.89 0.96  CALCIUM 9.4  --  8.5* 8.7*  --   --   --  8.8* 8.5*  MG  --  1.8  --  1.7  --   --   --   --   --   PHOS  --   --   --  4.1  --   --   --   --   --      GFR: Estimated Creatinine Clearance: 36.3 mL/min (by C-G formula based on SCr of 0.96 mg/dL).  Liver Function Tests: Recent Labs  Lab 07/18/2022 0213  AST 18  ALT 11  ALKPHOS 39  BILITOT 0.5  PROT 6.0*  ALBUMIN 3.0*     CBG: Recent Labs  Lab 07/29/2022 2146  GLUCAP 122*      Recent Results (from the past 240 hour(s))  Resp panel by RT-PCR (RSV, Flu A&B, Covid) Anterior Nasal Swab     Status: None   Collection Time: 08/03/2022  9:12 AM   Specimen: Anterior Nasal Swab  Result Value Ref Range Status   SARS Coronavirus 2 by RT PCR NEGATIVE NEGATIVE Final    Comment: (NOTE) SARS-CoV-2 target nucleic acids are NOT DETECTED.  The SARS-CoV-2 RNA is generally detectable in upper respiratory specimens during the acute phase of infection. The lowest concentration of SARS-CoV-2 viral copies this assay can detect is 138 copies/mL. A negative result does not preclude SARS-Cov-2 infection and should not be used as the sole basis for treatment or other patient management decisions. A negative result may occur with  improper specimen collection/handling, submission of specimen other than nasopharyngeal swab, presence of viral mutation(s) within the areas targeted by this assay, and inadequate number of viral copies(<138 copies/mL). A negative result must be combined  with clinical observations, patient history, and epidemiological information. The expected result is Negative.  Fact Sheet for Patients:  EntrepreneurPulse.com.au  Fact Sheet for Healthcare Providers:  IncredibleEmployment.be  This test is no t yet approved or cleared by the [place] FDA and  has been authorized for detection and/or diagnosis of SARS-CoV-2 by FDA under an Emergency Use Authorization (EUA). This EUA will remain  in effect (meaning this test can be used) for the duration of the COVID-19 declaration under Section 564(b)(1) of the Act, 21 U.S.C.section 360bbb-3(b)(1), unless the authorization is terminated  or revoked sooner.       Influenza A by PCR NEGATIVE NEGATIVE Final   Influenza B by PCR NEGATIVE NEGATIVE Final    Comment: (NOTE) The Xpert Xpress SARS-CoV-2/FLU/RSV plus assay is intended as an aid in the diagnosis of influenza from Nasopharyngeal swab specimens and should not be used as a sole basis for treatment. Nasal washings and aspirates are unacceptable for Xpert Xpress SARS-CoV-2/FLU/RSV testing.  Fact Sheet for Patients: EntrepreneurPulse.com.au  Fact Sheet for Healthcare Providers: IncredibleEmployment.be  This test is not yet approved or cleared by the [place] FDA and has been authorized for detection and/or diagnosis of SARS-CoV-2 by FDA under an Emergency Use Authorization (EUA). This EUA will remain in effect (meaning this test can be used) for the duration of the COVID-19 declaration under Section 564(b)(1) of the Act, 21 U.S.C. section 360bbb-3(b)(1), unless the authorization is terminated or revoked.     Resp Syncytial Virus by PCR NEGATIVE NEGATIVE Final    Comment: (NOTE) Fact Sheet for Patients: EntrepreneurPulse.com.au  Fact Sheet for Healthcare Providers: IncredibleEmployment.be  This test is not yet approved  or cleared by the [place] FDA and has been authorized for detection and/or diagnosis of SARS-CoV-2 by FDA under an Emergency Use Authorization (EUA). This EUA will  remain in effect (meaning this test can be used) for the duration of the COVID-19 declaration under Section 564(b)(1) of the Act, 21 U.S.C. section 360bbb-3(b)(1), unless the authorization is terminated or revoked.  Performed at [facility], [address], [place], [place] [address]   MRSA Next Gen by PCR, Nasal     Status: None   Collection Time: 08/11/22 11:57 PM   Specimen: Nasal Mucosa; Nasal Swab  Result Value Ref Range Status   MRSA by PCR Next Gen NOT DETECTED NOT DETECTED Final    Comment: (NOTE) The GeneXpert MRSA Assay (FDA approved for NASAL specimens only), is one component of a comprehensive MRSA colonization surveillance program. It is not intended to diagnose MRSA infection nor to guide or monitor treatment for MRSA infections. Test performance is not FDA approved in patients less than 51 years old. Performed at [facility], [place] [address], [place],  [address]          Radiology Studies: No results found.      Scheduled Meds:  aspirin EC  81 mg Oral Daily   famotidine  20 mg Oral Daily   lacosamide  50 mg Oral BID   metoprolol succinate  25 mg Oral Daily   metoprolol tartrate       rosuvastatin  20 mg Oral Daily   sodium chloride flush  3 mL Intravenous Q12H   Continuous Infusions:  sodium chloride     heparin 800 Units/hr (08/14/22 1443)     LOS: 4 days      Annita Brod, MD [facility]   To contact the attending provider between 7A-7P or the covering provider during after hours 7P-7A, please log into the web site [email or web address] and access using universal [place] password for that web site. If you do not have the password, please call the hospital operator.  08/14/2022, 2:52 PM

## 2022-08-14 NOTE — Progress Notes (Signed)
Rounding Note    Patient Name: Tracy Peters Date of Encounter: 08/14/2022  [place] Cardiologist: Werner Lean, MD   Subjective   Feels okay today. Had mild chest pain earlier this morning but has resolved. No SOB or presyncopal symptoms  Inpatient Medications    Scheduled Meds:  aspirin EC  81 mg Oral Daily   famotidine  20 mg Oral Daily   lacosamide  50 mg Oral BID   metoprolol succinate  25 mg Oral Daily   metoprolol tartrate       rosuvastatin  20 mg Oral Daily   sodium chloride flush  3 mL Intravenous Q12H   Continuous Infusions:  sodium chloride     heparin 850 Units/hr (08/14/22 0415)   PRN Meds: sodium chloride, acetaminophen, metoprolol tartrate, ondansetron (ZOFRAN) IV, sodium chloride flush, traMADol, zolpidem   Vital Signs    Vitals:   08/13/22 2012 08/14/22 0415 08/14/22 0417 08/14/22 0949  BP: (!) 123/57 126/71  135/82  Pulse: 89 78  80  Resp: 20 18    Temp: 97.7 F (36.5 C) 97.9 F (36.6 C)    TempSrc: Oral Oral    SpO2: 100% 100%    Weight:   54.6 kg   Height:       No intake or output data in the 24 hours ending 08/14/22 1127     08/14/2022    4:17 AM 08/13/2022    6:00 AM 07/20/2022   11:35 AM  Last 3 Weights  Weight (lbs) 120 lb 5.9 oz 121 lb 11.2 oz 115 lb 1.3 oz  Weight (kg) 54.6 kg 55.203 kg 52.2 kg      Telemetry    NSR - Personally Reviewed  ECG    No new tracing- Personally Reviewed  Physical Exam   GEN: Sitting in bed, comfortable, NAD  Neck: No significant JVD Cardiac: RRR, 3/6 harsh systolic murmur Respiratory: CTAB GI: Soft, nontender, non-distended  MS: No edema, warm Neuro:  Nonfocal  Psych: Normal affect   Labs    High Sensitivity Troponin:   Recent Labs  Lab 08/09/2022 0900 07/31/2022 1056 07/29/2022 1616 08/11/2022 1743  TROPONINIHS 199* 189* 241* 250*      Chemistry Recent Labs  Lab 07/23/2022 1056 08/11/22 0541 07/26/2022 0213 07/20/2022 1410 07/26/2022 1415  08/13/22 0732 08/14/22 0203  NA  --    < > 136   < > 137 138 135  K  --    < > 3.6   < > 3.5 3.8 3.9  CL  --    < > 99  --   --  103 99  CO2  --    < > 26  --   --  23 24  GLUCOSE  --    < > 107*  --   --  106* 134*  BUN  --    < > 20  --   --  23 30*  CREATININE  --    < > 1.23*  --   --  0.89 0.96  CALCIUM  --    < > 8.7*  --   --  8.8* 8.5*  MG 1.8  --  1.7  --   --   --   --   PROT  --   --  6.0*  --   --   --   --   ALBUMIN  --   --  3.0*  --   --   --   --  AST  --   --  18  --   --   --   --   ALT  --   --  11  --   --   --   --   ALKPHOS  --   --  39  --   --   --   --   BILITOT  --   --  0.5  --   --   --   --   GFRNONAA  --    < > 43*  --   --  >60 58*  ANIONGAP  --    < > 11  --   --  12 12   < > = values in this interval not displayed.     Lipids  Recent Labs  Lab 08/11/22 0541  CHOL 165  TRIG 65  HDL 48  LDLCALC 104*  CHOLHDL 3.4     Hematology Recent Labs  Lab 07/29/2022 0213 07/22/2022 1410 07/29/2022 1415 08/13/22 0219 08/14/22 0203  WBC 8.2  --   --  4.1 7.8  RBC 3.26*  --   --  3.41* 3.33*  HGB 10.9*   < > 11.6* 11.5* 11.1*  HCT 32.7*   < > 34.0* 33.7* 33.0*  MCV 100.3*  --   --  98.8 99.1  MCH 33.4  --   --  33.7 33.3  MCHC 33.3  --   --  34.1 33.6  RDW 13.2  --   --  13.0 12.9  PLT 264  --   --  276 271   < > = values in this interval not displayed.    Thyroid No results for input(s): "TSH", "FREET4" in the last 168 hours.  BNP Recent Labs  Lab 08/09/2022 0900  BNP 1,585.6*     DDimer  Recent Labs  Lab 08/08/2022 0900  DDIMER 3.00*      Radiology    CARDIAC CATHETERIZATION  Result Date: 08/11/2022 1.  Mild aorto ostial disease involving the ostial RCA and the ostial left main with less than 50% stenosis at both lesion sites 2.  Patent LAD, left circumflex, and RCA without any high-grade stenoses 3.  Mild pulmonary hypertension with mean PA pressure 26 mmHg, mildly increased wedge pressure of 19 mmHg, and otherwise normal right heart  hemodynamics.  Preserved cardiac output and index of 4.6 L/min and 2.99 L/min/m 4.  Known severe calcific aortic stenosis with heavy calcification and restriction of the aortic valve leaflets seen on plain fluoroscopy Recommendations: Resume heparin in 8 hours.  Continue TAVR evaluation with CT angiogram studies and formal cardiac surgical consultation as previously outlined    Cardiac Studies   08/09/2022 LHC/RHC 1.  Mild aorto ostial disease involving the ostial RCA and the ostial left main with less than 50% stenosis at both lesion sites 2.  Patent LAD, left circumflex, and RCA without any high-grade stenoses 3.  Mild pulmonary hypertension with mean PA pressure 26 mmHg, mildly increased wedge pressure of 19 mmHg, and otherwise normal right heart hemodynamics.  Preserved cardiac output and index of 4.6 L/min and 2.99 L/min/m 4.  Known severe calcific aortic stenosis with heavy calcification and restriction of the aortic valve leaflets seen on plain fluoroscopy   Recommendations: Resume heparin in 8 hours.  Continue TAVR evaluation with CT angiogram studies and formal cardiac surgical consultation as previously outlined  TTE 08/11/22: IMPRESSIONS     1. Left ventricular ejection fraction, by estimation, is 55 to  60%. The  left ventricle has normal function. The left ventricle has no regional  wall motion abnormalities. Left ventricular diastolic function could not  be evaluated. There is severe  hypokinesis of the left ventricular, basal-mid inferoseptal wall and  inferior wall.   2. Right ventricular systolic function is normal. The right ventricular  size is normal. There is normal pulmonary artery systolic pressure. The  estimated right ventricular systolic pressure is Q000111Q mmHg.   3. The mitral valve is degenerative. Mild mitral valve regurgitation. No  evidence of mitral stenosis. Moderate mitral annular calcification.   4. The aortic valve is calcified. There is severe  calcifcation of the  aortic valve. There is severe thickening of the aortic valve. Aortic valve  regurgitation is not visualized. Severe aortic valve stenosis. Aortic  valve area, by VTI measures 0.46 cm.  Aortic valve mean gradient measures 18.0 mmHg. Aortic valve Vmax measures  3.02 m/s. Although the mean AVG is mild Vmax is not in the severe range,  the SVI is low at 15 and DVI 0.18. These findings are consistent with  paradoxical low flow low gradient  severe AS.   5. The inferior vena cava is normal in size with greater than 50%  respiratory variability, suggesting right atrial pressure of 3 mmHg.   Patient Profile     89 y.o. female with history of HTN, HLD, PAD, prior DVT and mild carotid disease who presented with worsening chest pain and SOB found to have acute heart failure exacerbation, NSTEMI and suspected AS for which Cardiology was consulted.   Assessment & Plan    #Acute Diastolic HF: Patient presented with worsening SOB and chest pain found to have [id number]. CXR with pulmonary edema. TTE today with EF 50% with basal to mid septal and inferior wall hypokinesis and severe, parardoxical low flow, low gradient aortic stenosis. Cath without obstructive disease. Volume status improved with diuresis. Now undergoing TAVR work-up. -Off diuretics with improvement in volume status -Will add GDMT prior to discharge  #NSTEMI: Trop elevated to 250. Suspect demand in the setting of HF and AS. Cath without obstructive disease. -Continue heparin gtt pending plans for further procedures -Continue ASA '81mg'$  daily -Continue metop '25mg'$  XL daily  #Severe Paradoxical AS: TTE with mean gradient 71mHg, with Vmax 3.0 but with AVA 0.5cm2, DI 0.18 with low SVI consistent with severe, paradoxical AS. Visually, the valve appears severely stenotic. Suspect this is the primary driver of acute decompensation. Now followed by structural team. -Structural Heart Team consulted  #Possible vagal  episode #SVT: Patient became tachycardic while working with PT and then came back to the room and became minimally responsive with HR 40s. She then subsequently developed SVT. Possibly had a vagal episode with the drop in HR and presyncopal symptoms followed by SVT. Fortunately she converted spontaneously and now feels much better with no recurrence on telemetry today.  #HTN: -Continue metop '25mg'$  XL daily  #HLD: -Continue crestor '20mg'$  daily (dose increased this admission for LDL 104) -Repeat lipids in 8 weeks  #History of DVT on Warfarin: -Holding warfarin pending plans for possible procedure -Continue heparin gtt -Repeat INR this afternoon   For questions or updates, please contact CToyahPlease consult [email or web address] for contact info under        Signed, HFreada Bergeron MD  08/14/2022, 11:27 AM

## 2022-08-15 ENCOUNTER — Other Ambulatory Visit (HOSPITAL_COMMUNITY): Payer: Self-pay

## 2022-08-15 DIAGNOSIS — R7989 Other specified abnormal findings of blood chemistry: Secondary | ICD-10-CM | POA: Diagnosis not present

## 2022-08-15 DIAGNOSIS — I10 Essential (primary) hypertension: Secondary | ICD-10-CM | POA: Diagnosis not present

## 2022-08-15 DIAGNOSIS — I350 Nonrheumatic aortic (valve) stenosis: Secondary | ICD-10-CM | POA: Diagnosis not present

## 2022-08-15 DIAGNOSIS — I214 Non-ST elevation (NSTEMI) myocardial infarction: Secondary | ICD-10-CM | POA: Diagnosis not present

## 2022-08-15 DIAGNOSIS — I5031 Acute diastolic (congestive) heart failure: Secondary | ICD-10-CM | POA: Diagnosis not present

## 2022-08-15 LAB — CBC
HCT: 30.1 % — ABNORMAL LOW (ref 36.0–46.0)
Hemoglobin: 10 g/dL — ABNORMAL LOW (ref 12.0–15.0)
MCH: 33.4 pg (ref 26.0–34.0)
MCHC: 33.2 g/dL (ref 30.0–36.0)
MCV: 100.7 fL — ABNORMAL HIGH (ref 80.0–100.0)
Platelets: 257 10*3/uL (ref 150–400)
RBC: 2.99 MIL/uL — ABNORMAL LOW (ref 3.87–5.11)
RDW: 12.9 % (ref 11.5–15.5)
WBC: 11.3 10*3/uL — ABNORMAL HIGH (ref 4.0–10.5)
nRBC: 0 % (ref 0.0–0.2)

## 2022-08-15 LAB — PROTIME-INR
INR: 1.4 — ABNORMAL HIGH (ref 0.8–1.2)
Prothrombin Time: 17 seconds — ABNORMAL HIGH (ref 11.4–15.2)

## 2022-08-15 LAB — HEPARIN LEVEL (UNFRACTIONATED): Heparin Unfractionated: 0.48 IU/mL (ref 0.30–0.70)

## 2022-08-15 MED ORDER — APIXABAN 2.5 MG PO TABS
2.5000 mg | ORAL_TABLET | Freq: Two times a day (BID) | ORAL | 3 refills | Status: DC
  Filled 2022-08-15: qty 60, 30d supply, fill #0

## 2022-08-15 MED ORDER — WARFARIN SODIUM 5 MG PO TABS: 10.0000 mg | ORAL_TABLET | Freq: Once | ORAL | Status: DC

## 2022-08-15 MED ORDER — FUROSEMIDE 20 MG PO TABS
20.0000 mg | ORAL_TABLET | Freq: Every day | ORAL | Status: DC
  Administered 2022-08-15: 20 mg via ORAL
  Filled 2022-08-15: qty 1

## 2022-08-15 MED ORDER — SODIUM CHLORIDE 0.9 % IV SOLN: INTRAVENOUS | Status: DC

## 2022-08-15 MED ORDER — APIXABAN 2.5 MG PO TABS
2.5000 mg | ORAL_TABLET | Freq: Two times a day (BID) | ORAL | Status: DC
Start: 2022-08-15 — End: 2022-08-17
  Administered 2022-08-15 – 2022-08-17 (×5): 2.5 mg via ORAL
  Filled 2022-08-15 (×5): qty 1

## 2022-08-15 NOTE — TOC Benefit Eligibility Note (Signed)
Patient Advocate Encounter  Insurance verification completed.    The patient is currently admitted and upon discharge could be taking Eliquis 5 mg.  The current 30 day co-pay is $47.00.   The patient is insured through [employer] Medicare Part D   Bonifacio Pruden, CPHT Pharmacy Patient Advocate Specialist [facility] Patient Advocate Team Direct Number: [phone]  Fax: [phone]       

## 2022-08-15 NOTE — TOC Transition Note (Signed)
Discharge medications (1) are being stored in the main pharmacy on the ground floor until patient is ready for discharge.

## 2022-08-15 NOTE — Progress Notes (Signed)
CARDIAC REHAB PHASE I    Pt has just returned to bed from ambulating in room and to bathroom. Tolerated well with no CP and mild SOB, reports feeling weak and tired. Post MI education including MI booklet, restrictions, risk factors, exercise guidelines, site care, heart healthy diet and CRP2 reviewed. All questions and concerns addressed. Referral placed for [facility] CRP2 per protocol. However, pt pending surgery plan. Encouraged pt to ambulate later today, with focus on slow easy pace. Will continue to follow.    0900-1000 Vanessa Barbara, RN BSN 08/15/2022 9:50 AM

## 2022-08-15 NOTE — Progress Notes (Signed)
PROGRESS NOTE    Tracy Peters  [id number] DOB: 04-17-1934 DOA: 07/28/2022 PCP: Cassandria Anger, MD  Chief Complaint  Patient presents with   Chest Pain   Wheezing    Brief Narrative:   Tracy Peters is a 89 y.o. female past medical history of DVT on Coumadin, peripheral vascular disease & hypertension who presented to the ED on 2/25 for shortness of breath x 2 weeks, progressively worsening as well as 2 days of chest pain at rest.  The emergency room, patient found to have new onset heart failure with a BNP of 1500 and chest x-ray noting pulmonary edema.  Patient admitted for treatment of new onset acute congestive heart failure.  Cardiology consulted.  Workup revealed severe aortic stenosis.  Patient underwent left and right heart catheterization revealing mild pulmonary hypertension and confirming severe calcific aortic stenosis.  CT surgery consulted and are recommending TAVR.  Unfortunately, this cannot be done until 3/12.    Assessment & Plan:   Principal Problem:   Elevated troponin I level Active Problems:   Essential hypertension   NSTEMI (non-ST elevated myocardial infarction) (HCC)   Elevated d-dimer   Elevated troponin   Severe aortic stenosis   Vasovagal near syncope  Shortness of Breath HFpEF  secondary to severe aortic Stenosis Appreciate cardiology help.  IV Lasix changed to p.o.  Status post left and right heart catheterization.  Due to surgery recommending TAVR, which cannot be done until 3/12.   Chest Pain  NSTEMI  Elevated Troponin Appreciate cardiology recommendations.  Elevated troponins initially thought to be demand ischemia in the setting of heart failure and aortic stenosis, but wall motion abnormalities on 2D echo concerning for underlying CAD.  Status post left and right heart catheterization with no significant coronary lesions noted.  Continue heparin infusion plus aspirin.  Continue Crestor, LDL at 104 with target below 70.   Continue antihypertensives   Essential hypertension Metoprolol, losartan  Left hip pain Appears to be below left lower quadrant of abdomen, above the groin.  Tender to deep palpation.  Patient able to do straight leg raising and externally obturated.  As patient relates, patient had weight belt done by PT and when she started to fall, belt was used to help hold her and prevent her from falling.  Pain control for now.  Continue to monitor, expect this should improve on its own  Hx DVT Previously was on Coumadin and then Coumadin on hold and changed over to heparin.  Cardiology has since changed to Eliquis for better management  Abnormal CXR No infectious symptoms, monitor - suspect related to HF above Repeat as indicated   DVT prophylaxis: Eliquis Code Status: full Family Communication: Will call family Disposition:   Status is: Inpatient Remains inpatient appropriate because:  -Tolerance of adjustments in medication of adding Lasix and changing to Eliquis   Consultants:  Cardiology Cardiothoracic surgery  Procedures:  Status post left and right heart catheterization  Echo IMPRESSIONS     1. Left ventricular ejection fraction, by estimation, is 55 to 60%. The  left ventricle has normal function. The left ventricle has no regional  wall motion abnormalities. Left ventricular diastolic function could not  be evaluated. There is severe  hypokinesis of the left ventricular, basal-mid inferoseptal wall and  inferior wall.   2. Right ventricular systolic function is normal. The right ventricular  size is normal. There is normal pulmonary artery systolic pressure. The  estimated right ventricular systolic pressure is [id number] mmHg.  3. The mitral valve is degenerative. Mild mitral valve regurgitation. No  evidence of mitral stenosis. Moderate mitral annular calcification.   4. The aortic valve is calcified. There is severe calcifcation of the  aortic valve. There is severe  thickening of the aortic valve. Aortic valve  regurgitation is not visualized. Severe aortic valve stenosis. Aortic  valve area, by VTI measures 0.46 cm.  Aortic valve mean gradient measures 18.0 mmHg. Aortic valve Vmax measures  3.02 m/s. Although the mean AVG is mild Vmax is not in the severe range,  the SVI is low at 15 and DVI 0.18. These findings are consistent with  paradoxical low flow low gradient  severe AS.   5. The inferior vena cava is normal in size with greater than 50%  respiratory variability, suggesting right atrial pressure of 3 mmHg.   Antimicrobials:  Anti-infectives (From admission, onward)    Start     Dose/Rate Route Frequency Ordered Stop   07/26/2022 1145  cefTRIAXone (ROCEPHIN) 1 g in sodium chloride 0.9 % 100 mL IVPB        1 g 200 mL/hr over 30 Minutes Intravenous  Once 07/21/2022 1132 08/11/2022 1313   07/17/2022 1145  doxycycline (VIBRA-TABS) tablet 100 mg        100 mg Oral  Once 08/02/2022 1132 08/01/2022 1236       Subjective: Tired, complains of some left lower quadrant abdominal pain right above the groin.  Objective: Vitals:   08/14/22 2226 08/15/22 0434 08/15/22 0854 08/15/22 1420  BP: 132/74 115/70 120/70 (!) 143/78  Pulse: 82 70 78 77  Resp: '18 16  19  '$ Temp: 97.6 F (36.4 C) (!) 97.5 F (36.4 C)  (!) 97.5 F (36.4 C)  TempSrc: Oral Oral  Oral  SpO2: 100% 99%  100%  Weight:  54.9 kg    Height:        Intake/Output Summary (Last 24 hours) at 08/15/2022 1704 Last data filed at 08/15/2022 1659 Gross per 24 hour  Intake 809.99 ml  Output 600 ml  Net 209.99 ml    Filed Weights   08/13/22 0600 08/14/22 0417 08/15/22 0434  Weight: 55.2 kg 54.6 kg 54.9 kg    Examination:  General: Alert and oriented x 3, no acute distress. Cardiovascular: Regular rate and rhythm, S1-S2, 3 out of 6 systolic ejection murmur Lungs: Poor inspiratory effort, otherwise clear to auscultation bilaterally Abdomen: Soft, tenderness right below her left lower quadrant,  above the groin (appears to be more musculoskeletal), nondistended, positive bowel sounds Neurological: No focal deficits Extremities: No clubbing or cyanosis.  Trace pitting edema.   Data Reviewed: I have personally reviewed following labs and imaging studies  CBC: Recent Labs  Lab 08/11/22 0541 07/27/2022 0213 08/06/2022 1410 08/08/2022 1411 08/05/2022 1415 08/13/22 0219 08/14/22 0203 08/15/22 0431  WBC 7.3 8.2  --   --   --  4.1 7.8 11.3*  NEUTROABS  --  4.8  --   --   --   --   --   --   HGB 11.0* 10.9*   < > 11.9* 11.6* 11.5* 11.1* 10.0*  HCT 32.5* 32.7*   < > 35.0* 34.0* 33.7* 33.0* 30.1*  MCV 99.1 100.3*  --   --   --  98.8 99.1 100.7*  PLT 257 264  --   --   --  276 271 257   < > = values in this interval not displayed.     Basic Metabolic Panel: Recent Labs  Lab 08/01/2022 0900 07/22/2022 1056 08/11/22 0541 07/20/2022 0213 08/13/2022 1410 08/08/2022 1411 08/13/2022 1415 08/13/22 0732 08/14/22 0203  NA 135  --  135 136 139 139 137 138 135  K 3.9  --  3.6 3.6 3.5 3.6 3.5 3.8 3.9  CL 101  --  100 99  --   --   --  103 99  CO2 21*  --  24 26  --   --   --  23 24  GLUCOSE 94  --  100* 107*  --   --   --  106* 134*  BUN 15  --  15 20  --   --   --  23 30*  CREATININE 0.99  --  1.08* 1.23*  --   --   --  0.89 0.96  CALCIUM 9.4  --  8.5* 8.7*  --   --   --  8.8* 8.5*  MG  --  1.8  --  1.7  --   --   --   --   --   PHOS  --   --   --  4.1  --   --   --   --   --      GFR: Estimated Creatinine Clearance: 36.3 mL/min (by C-G formula based on SCr of 0.96 mg/dL).  Liver Function Tests: Recent Labs  Lab 07/26/2022 0213  AST 18  ALT 11  ALKPHOS 39  BILITOT 0.5  PROT 6.0*  ALBUMIN 3.0*     CBG: Recent Labs  Lab 07/22/2022 2146  GLUCAP 122*      Recent Results (from the past 240 hour(s))  Resp panel by RT-PCR (RSV, Flu A&B, Covid) Anterior Nasal Swab     Status: None   Collection Time: 08/05/2022  9:12 AM   Specimen: Anterior Nasal Swab  Result Value Ref Range Status    SARS Coronavirus 2 by RT PCR NEGATIVE NEGATIVE Final    Comment: (NOTE) SARS-CoV-2 target nucleic acids are NOT DETECTED.  The SARS-CoV-2 RNA is generally detectable in upper respiratory specimens during the acute phase of infection. The lowest concentration of SARS-CoV-2 viral copies this assay can detect is 138 copies/mL. A negative result does not preclude SARS-Cov-2 infection and should not be used as the sole basis for treatment or other patient management decisions. A negative result may occur with  improper specimen collection/handling, submission of specimen other than nasopharyngeal swab, presence of viral mutation(s) within the areas targeted by this assay, and inadequate number of viral copies(<138 copies/mL). A negative result must be combined with clinical observations, patient history, and epidemiological information. The expected result is Negative.  Fact Sheet for Patients:  EntrepreneurPulse.com.au  Fact Sheet for Healthcare Providers:  IncredibleEmployment.be  This test is no t yet approved or cleared by the [place] FDA and  has been authorized for detection and/or diagnosis of SARS-CoV-2 by FDA under an Emergency Use Authorization (EUA). This EUA will remain  in effect (meaning this test can be used) for the duration of the COVID-19 declaration under Section 564(b)(1) of the Act, 21 U.S.C.section 360bbb-3(b)(1), unless the authorization is terminated  or revoked sooner.       Influenza A by PCR NEGATIVE NEGATIVE Final   Influenza B by PCR NEGATIVE NEGATIVE Final    Comment: (NOTE) The Xpert Xpress SARS-CoV-2/FLU/RSV plus assay is intended as an aid in the diagnosis of influenza from Nasopharyngeal swab specimens and should not be used as a sole basis for treatment.  Nasal washings and aspirates are unacceptable for Xpert Xpress SARS-CoV-2/FLU/RSV testing.  Fact Sheet for  Patients: EntrepreneurPulse.com.au  Fact Sheet for Healthcare Providers: IncredibleEmployment.be  This test is not yet approved or cleared by the [place] FDA and has been authorized for detection and/or diagnosis of SARS-CoV-2 by FDA under an Emergency Use Authorization (EUA). This EUA will remain in effect (meaning this test can be used) for the duration of the COVID-19 declaration under Section 564(b)(1) of the Act, 21 U.S.C. section 360bbb-3(b)(1), unless the authorization is terminated or revoked.     Resp Syncytial Virus by PCR NEGATIVE NEGATIVE Final    Comment: (NOTE) Fact Sheet for Patients: EntrepreneurPulse.com.au  Fact Sheet for Healthcare Providers: IncredibleEmployment.be  This test is not yet approved or cleared by the [place] FDA and has been authorized for detection and/or diagnosis of SARS-CoV-2 by FDA under an Emergency Use Authorization (EUA). This EUA will remain in effect (meaning this test can be used) for the duration of the COVID-19 declaration under Section 564(b)(1) of the Act, 21 U.S.C. section 360bbb-3(b)(1), unless the authorization is terminated or revoked.  Performed at [facility], [address] [place], [place] [address]   MRSA Next Gen by PCR, Nasal     Status: None   Collection Time: 08/11/22 11:57 PM   Specimen: Nasal Mucosa; Nasal Swab  Result Value Ref Range Status   MRSA by PCR Next Gen NOT DETECTED NOT DETECTED Final    Comment: (NOTE) The GeneXpert MRSA Assay (FDA approved for NASAL specimens only), is one component of a comprehensive MRSA colonization surveillance program. It is not intended to diagnose MRSA infection nor to guide or monitor treatment for MRSA infections. Test performance is not FDA approved in patients less than 27 years old. Performed at [facility], [place] [address], [place], [place] [address]           Radiology Studies: CT ANGIO ABDOMEN PELVIS  W &/OR WO CONTRAST  Result Date: 08/15/2022 CLINICAL DATA:  89 year old female with history of severe aortic stenosis. Preprocedural study prior to potential transcatheter aortic valve replacement (TAVR) procedure. EXAM: CT ANGIOGRAPHY CHEST, ABDOMEN AND PELVIS TECHNIQUE: Multidetector CT imaging through the chest, abdomen and pelvis was performed using the standard protocol during bolus administration of intravenous contrast. Multiplanar reconstructed images and MIPs were obtained and reviewed to evaluate the vascular anatomy. RADIATION DOSE REDUCTION: This exam was performed according to the departmental dose-optimization program which includes automated exposure control, adjustment of the mA and/or kV according to patient size and/or use of iterative reconstruction technique. CONTRAST:  13m OMNIPAQUE IOHEXOL 350 MG/ML SOLN COMPARISON:  None Available. FINDINGS: CTA CHEST FINDINGS Cardiovascular: Heart size is normal. There is no significant pericardial fluid, thickening or pericardial calcification. There is aortic atherosclerosis, as well as atherosclerosis of the great vessels of the mediastinum and the coronary arteries, including calcified atherosclerotic plaque in the left main coronary artery. Severe thickening and calcification of the aortic valve. Mediastinum/Lymph Nodes: No pathologically enlarged mediastinal or hilar lymph nodes. Esophagus is unremarkable in appearance. No axillary lymphadenopathy. Lungs/Pleura: Small to moderate bilateral pleural effusions lying dependently with passive areas of subsegmental atelectasis in the lungs bilaterally. No confluent consolidative airspace disease. No definite suspicious appearing pulmonary nodules or masses are noted. Musculoskeletal/Soft Tissues: Orthopedic fixation hardware in the cervical spine incidentally noted. There are no aggressive appearing lytic or blastic lesions noted in the visualized  portions of the skeleton. CTA ABDOMEN AND PELVIS FINDINGS Hepatobiliary: No suspicious cystic or  solid hepatic lesions. No intra or extrahepatic biliary ductal dilatation. Status post cholecystectomy. Pancreas: No pancreatic mass. No pancreatic ductal dilatation. No pancreatic or peripancreatic fluid collections or inflammatory changes. Spleen: Unremarkable. Adrenals/Urinary Tract: Severe atrophy of the left kidney. Multifocal cortical scarring in both kidneys. Right kidney and bilateral adrenal glands are otherwise unremarkable in appearance. No hydroureteronephrosis. Urinary bladder is normal in appearance. Stomach/Bowel: The appearance of the stomach is normal. There is no pathologic dilatation of small bowel or colon. The appendix is not confidently identified and may be surgically absent. Regardless, there are no inflammatory changes noted adjacent to the cecum to suggest the presence of an acute appendicitis at this time. Vascular/Lymphatic: Atherosclerosis throughout the abdominal and pelvic vasculature, with vascular findings and measurements pertinent to potential TAVR procedure, as detailed below. No lymphadenopathy noted in the abdomen or pelvis. Reproductive: Status post hysterectomy.  Ovaries are atrophic. Other: No significant volume of ascites.  No pneumoperitoneum. Musculoskeletal: Status post right hip arthroplasty. There are no aggressive appearing lytic or blastic lesions noted in the visualized portions of the skeleton. VASCULAR MEASUREMENTS PERTINENT TO TAVR: AORTA: Minimal Aortic Diameter-13 x 12 mm Severity of Aortic Calcification-moderate to severe RIGHT PELVIS: Right Common Iliac Artery - Minimal Diameter-5.5 x 6.7 mm Tortuosity-moderate Calcification-moderate Right External Iliac Artery - Minimal Diameter-5.6 x 6.0 mm Tortuosity-mild Calcification-none Right Common Femoral Artery - Minimal Diameter-4.8 x 5.8 mm Tortuosity-mild Calcification-mild LEFT PELVIS: Left Common Iliac Artery -  Minimal Diameter-5.5 x 6.3 mm Tortuosity-severe Calcification-moderate Left External Iliac Artery - Minimal Diameter-6.2 x 5.7 mm Tortuosity-mild Calcification-mild Left Common Femoral Artery - Minimal Diameter-5.1 x 5.1 mm Tortuosity-mild Calcification-mild Review of the MIP images confirms the above findings. IMPRESSION: 1. Vascular findings and measurements pertinent to potential TAVR procedure, as detailed above. 2. Severe thickening and calcification of the aortic valve, compatible with reported clinical history of severe aortic stenosis. 3. Small to moderate bilateral pleural effusions with areas of passive subsegmental atelectasis throughout the lungs bilaterally. 4. Aortic atherosclerosis, in addition to left main coronary artery disease. 5. Additional incidental findings, as above. Electronically Signed   By: Vinnie Langton M.D.   On: 08/15/2022 07:53   CT ANGIO CHEST AORTA W/CM & OR WO/CM  Result Date: 08/15/2022 CLINICAL DATA:  89 year old female with history of severe aortic stenosis. Preprocedural study prior to potential transcatheter aortic valve replacement (TAVR) procedure. EXAM: CT ANGIOGRAPHY CHEST, ABDOMEN AND PELVIS TECHNIQUE: Multidetector CT imaging through the chest, abdomen and pelvis was performed using the standard protocol during bolus administration of intravenous contrast. Multiplanar reconstructed images and MIPs were obtained and reviewed to evaluate the vascular anatomy. RADIATION DOSE REDUCTION: This exam was performed according to the departmental dose-optimization program which includes automated exposure control, adjustment of the mA and/or kV according to patient size and/or use of iterative reconstruction technique. CONTRAST:  174m OMNIPAQUE IOHEXOL 350 MG/ML SOLN COMPARISON:  None Available. FINDINGS: CTA CHEST FINDINGS Cardiovascular: Heart size is normal. There is no significant pericardial fluid, thickening or pericardial calcification. There is aortic  atherosclerosis, as well as atherosclerosis of the great vessels of the mediastinum and the coronary arteries, including calcified atherosclerotic plaque in the left main coronary artery. Severe thickening and calcification of the aortic valve. Mediastinum/Lymph Nodes: No pathologically enlarged mediastinal or hilar lymph nodes. Esophagus is unremarkable in appearance. No axillary lymphadenopathy. Lungs/Pleura: Small to moderate bilateral pleural effusions lying dependently with passive areas of subsegmental atelectasis in the lungs bilaterally. No confluent consolidative airspace disease. No definite suspicious appearing pulmonary nodules or  masses are noted. Musculoskeletal/Soft Tissues: Orthopedic fixation hardware in the cervical spine incidentally noted. There are no aggressive appearing lytic or blastic lesions noted in the visualized portions of the skeleton. CTA ABDOMEN AND PELVIS FINDINGS Hepatobiliary: No suspicious cystic or solid hepatic lesions. No intra or extrahepatic biliary ductal dilatation. Status post cholecystectomy. Pancreas: No pancreatic mass. No pancreatic ductal dilatation. No pancreatic or peripancreatic fluid collections or inflammatory changes. Spleen: Unremarkable. Adrenals/Urinary Tract: Severe atrophy of the left kidney. Multifocal cortical scarring in both kidneys. Right kidney and bilateral adrenal glands are otherwise unremarkable in appearance. No hydroureteronephrosis. Urinary bladder is normal in appearance. Stomach/Bowel: The appearance of the stomach is normal. There is no pathologic dilatation of small bowel or colon. The appendix is not confidently identified and may be surgically absent. Regardless, there are no inflammatory changes noted adjacent to the cecum to suggest the presence of an acute appendicitis at this time. Vascular/Lymphatic: Atherosclerosis throughout the abdominal and pelvic vasculature, with vascular findings and measurements pertinent to potential TAVR  procedure, as detailed below. No lymphadenopathy noted in the abdomen or pelvis. Reproductive: Status post hysterectomy.  Ovaries are atrophic. Other: No significant volume of ascites.  No pneumoperitoneum. Musculoskeletal: Status post right hip arthroplasty. There are no aggressive appearing lytic or blastic lesions noted in the visualized portions of the skeleton. VASCULAR MEASUREMENTS PERTINENT TO TAVR: AORTA: Minimal Aortic Diameter-13 x 12 mm Severity of Aortic Calcification-moderate to severe RIGHT PELVIS: Right Common Iliac Artery - Minimal Diameter-5.5 x 6.7 mm Tortuosity-moderate Calcification-moderate Right External Iliac Artery - Minimal Diameter-5.6 x 6.0 mm Tortuosity-mild Calcification-none Right Common Femoral Artery - Minimal Diameter-4.8 x 5.8 mm Tortuosity-mild Calcification-mild LEFT PELVIS: Left Common Iliac Artery - Minimal Diameter-5.5 x 6.3 mm Tortuosity-severe Calcification-moderate Left External Iliac Artery - Minimal Diameter-6.2 x 5.7 mm Tortuosity-mild Calcification-mild Left Common Femoral Artery - Minimal Diameter-5.1 x 5.1 mm Tortuosity-mild Calcification-mild Review of the MIP images confirms the above findings. IMPRESSION: 1. Vascular findings and measurements pertinent to potential TAVR procedure, as detailed above. 2. Severe thickening and calcification of the aortic valve, compatible with reported clinical history of severe aortic stenosis. 3. Small to moderate bilateral pleural effusions with areas of passive subsegmental atelectasis throughout the lungs bilaterally. 4. Aortic atherosclerosis, in addition to left main coronary artery disease. 5. Additional incidental findings, as above. Electronically Signed   By: Vinnie Langton M.D.   On: 08/15/2022 07:53   CT CORONARY MORPH W/CTA COR W/SCORE W/CA W/CM &/OR WO/CM  Addendum Date: 08/15/2022   ADDENDUM REPORT: 08/15/2022 06:57 ADDENDUM: Extracardiac findings will be described separately under dictation for contemporaneously  obtained CTA chest, abdomen and pelvis dated 08/14/2022. Please see that dictation for full description of relevant extracardiac findings. Electronically Signed   By: Vinnie Langton M.D.   On: 08/15/2022 06:57   Result Date: 08/15/2022 CLINICAL DATA:  Aortic Valve pathology with assessment for TAVR EXAM: Cardiac TAVR CT TECHNIQUE: The patient was scanned on a Siemens Force [id number] slice scanner. A 120 kV retrospective scan was triggered in the descending thoracic aorta at 111 HU's. Gantry rotation speed was 270 msecs and collimation was .9 mm. No beta blockade or nitro were given. The 3D data set was reconstructed in 5% intervals of the R-R cycle. Systolic and diastolic phases were analyzed on a dedicated work station using MPR, MIP and VRT modes. The patient received 100 cc of contrast. FINDINGS: Aortic Valve: Severely thickened aortic valve with heavy calcification and reduced excursion the planimeter valve area is 0.741 Sq cm consistent  with critical aortic stenosis Number of leaflets: Three LVOT calcification: None Annular calcification: None Aortic Valve Calcium Score: 4115 Perimembranous septal diameter: 5 mm Mitral Valve: No stenosis without significant MAC Aortic Annulus Measurements- 30% Phase Major annulus diameter: 24 mm Minor annulus diameter: 21 mm Annular perimeter: 70 mm Annular area: 3.80 cm2 Aortic Root Measurements Sinotubular Junction: 27 mm Ascending Thoracic Aorta: 35 mm Aortic Arch: 28 mm Descending Thoracic Aorta: 23 mm Aortic atherosclerosis. Sinus of Valsalva Measurements: Right coronary cusp width: 28 mm Left coronary cusp width: 29 mm Non coronary cusp width: 28 mm Coronary Artery Height above Annulus: Left Main: 15 mm Left SoV height: 21 mm Right Coronary: 15 mm Right SoV height: 21 mm Optimum Fluoroscopic Angle for Delivery: LAO 14, CAU 11 Cusp overlay view angle:RAO 0, CAU 27 Valves for structural team consideration: 23 mm Sapien Valve There are sufficient sinus dimensions to a 26 mm  Core Valve Non TAVR Valve Findings: Coronary Arteries: Normal coronary origin. Study not completed with nitroglycerin. Coronary Calcium Score: Left main: 168 Left anterior descending artery: 1 Left circumflex artery: 1 Right coronary artery: 215 Total: 385 Systemic veins: Normal drainage Main Pulmonary artery: Normal dimensions Pulmonary veins: Normal variant anatomy-presence of a right middle pulmonary vein Left atrial appendage: Decreased filling in the distal tip of the left atrium; more consistent delayed filling that true thrombus. Interatrial septum: No communications. Extra Cardiac Findings as per separate reporting. IMPRESSION: 1. Critical Aortic stenosis. Findings pertinent to TAVR procedure are detailed above. RECOMMENDATIONS: The proposed cut-off value of 1,651 AU yielded a 93 % sensitivity and 75 % specificity in grading AS severity in patients with classical low-flow, low-gradient AS. Proposed different cut-off values to define severe AS for men and women as 2,065 AU and 1,274 AU, respectively. The joint European and American recommendations for the assessment of AS consider the aortic valve calcium score as a continuum - a very high calcium score suggests severe AS and a low calcium score suggests severe AS is unlikely. Kerman Passey, et al. 2017 ESC/EACTS Guidelines for the management of valvular heart disease. Eur Heart J [phone] Coronary artery calcium (CAC) score is a strong predictor of incident coronary heart disease (CHD) and provides predictive information beyond traditional risk factors. CAC scoring is reasonable to use in the decision to withhold, postpone, or initiate statin therapy in intermediate-risk or selected borderline-risk asymptomatic adults (age 86-75 years and LDL-C >=70 to <190 mg/dL) who do not have diabetes or established atherosclerotic cardiovascular disease (ASCVD).* In intermediate-risk (10-year ASCVD risk >=7.5% to <20%) adults or selected  borderline-risk (10-year ASCVD risk >=5% to <7.5%) adults in whom a CAC score is measured for the purpose of making a treatment decision the following recommendations have been made: If CAC = 0, it is reasonable to withhold statin therapy and reassess in 5 to 10 years, as long as higher risk conditions are absent (diabetes mellitus, family history of premature CHD in first degree relatives (males <55 years; females <65 years), cigarette smoking, LDL >=190 mg/dL or other independent risk factors). If CAC is 1 to 99, it is reasonable to initiate statin therapy for patients >=2 years of age. If CAC is >=100 or >=75th percentile, it is reasonable to initiate statin therapy at any age. Cardiology referral should be considered for patients with CAC scores >=400 or >=75th percentile. *2018 AHA/ACC/AACVPR/AAPA/ABC/ACPM/ADA/AGS/APhA/ASPC/NLA/PCNA Guideline on the Management of Blood Cholesterol: A Report of the [employer] of Cardiology/American Heart Association Task Force on Clinical Practice  Guidelines. J Am Coll Cardiol. 2019;73(24):3168-3209. Mahesh  Chandrasekhar Electronically Signed: By: Rudean Haskell M.D. On: 08/14/2022 18:03        Scheduled Meds:  apixaban  2.5 mg Oral BID   aspirin EC  81 mg Oral Daily   famotidine  20 mg Oral Daily   furosemide  20 mg Oral Daily   lacosamide  50 mg Oral BID   metoprolol succinate  25 mg Oral Daily   rosuvastatin  20 mg Oral Daily   sodium chloride flush  3 mL Intravenous Q12H   Continuous Infusions:  sodium chloride       LOS: 5 days      Annita Brod, MD [facility]   To contact the attending provider between 7A-7P or the covering provider during after hours 7P-7A, please log into the web site [email or web address] and access using universal [place] password for that web site. If you do not have the password, please call the hospital operator.  08/15/2022, 5:04 PM

## 2022-08-15 NOTE — Progress Notes (Signed)
Orient for heparin, warfarin Indication:  NSTEMI   Patient Measurements: Height: '5\' 4"'$  (162.6 cm) Weight: 54.9 kg (121 lb 0.5 oz) IBW/kg (Calculated) : 54.7 Heparin Dosing Weight: TBW  Vital Signs: Temp: 97.5 F (36.4 C) (03/01 0434) Temp Source: Oral (03/01 0434) BP: 120/70 (03/01 0854) Pulse Rate: 78 (03/01 0854)  Labs: Recent Labs    08/13/22 0219 08/13/22 0732 08/13/22 1604 08/14/22 0203 08/14/22 1148 08/15/22 0431  HGB 11.5*  --   --  11.1*  --  10.0*  HCT 33.7*  --   --  33.0*  --  30.1*  PLT 276  --   --  271  --  257  LABPROT 17.1*  --   --  17.0*  --  17.0*  INR 1.4*  --   --  1.4*  --  1.4*  HEPARINUNFRC 0.24*  --    < > 0.76* 0.74* 0.48  CREATININE  --  0.89  --  0.96  --   --    < > = values in this interval not displayed.     Estimated Creatinine Clearance: 36.3 mL/min (by C-G formula based on SCr of 0.96 mg/dL).  Assessment: 3 YOF presenting with wheezing and CP, hx of VTE on warfarin PTA, INR therapeutic on admission at 2.2 however recently subtherapeutic, and concern for PE with VQ scan negative.  Pharmacy consulted to start heparin and plans were to start when INR < 2.0  Now s/p RHC/LHC on heparin for TAVR evaluation  Restarting warfarin 2/29 -INR= 1.4 -clinic dosing (08/05/22): 7.'5mg'$ /day except take '5mg'$  MW  Heparin level at goal this AM, no overt bleeding or complications noted.  Goal of Therapy:  Heparin level 0.3-0.7 units/ml INR 2-3 Monitor platelets by anticoagulation protocol: Yes   Plan:  -Continue IV heparin at 800 units/hr -Heparin level daily wth CBC daily -'10mg'$  warfarin today -Daily PT/INR  Marguerite Olea, [facility] Clinical Pharmacist  08/15/2022 10:55 AM   [facility] pharmacy phone numbers are listed on [email or web address]

## 2022-08-15 NOTE — Progress Notes (Signed)
Pt ambulated about 50 ft with staff without any complaints.  HR remained stable.  Idolina Primer, RN

## 2022-08-15 NOTE — Progress Notes (Addendum)
Physical Therapy Treatment Patient Details Name: Tracy Peters MRN: [id number] DOB: 05-25-1934 Today's Date: 08/15/2022   History of Present Illness Tracy Peters is a 89 y.o. female past medical history of DVT on Coumadin, peripheral vascular disease & hypertension who presented to the ED on 2/25 for shortness of breath x 2 weeks, progressively worsening as well as 2 days of chest pain at rest.  Found to have NSTEMI and severe Aortic Stenosis.  Work up underway for [employer].    PT Comments    Pt tolerated treatment well today. Pt was able to ambulate in hallway 25f  with RW +2 CGA for safety. Pt and husband are anxious about any mobility following previous session. Pt and husband report new groin and lower abdominal pain which they attribute to gait belt being pulled to prevent pt from falling during previous session. Pt would be benefit from continued mobility during acute stay within tolerance. No change in DC/DME recs  Recommendations for follow up therapy are one component of a multi-disciplinary discharge planning process, led by the attending physician.  Recommendations may be updated based on patient status, additional functional criteria and insurance authorization.  Follow Up Recommendations  No PT follow up     Assistance Recommended at Discharge Set up Supervision/Assistance  Patient can return home with the following A little help with walking and/or transfers;Assistance with cooking/housework;Assist for transportation;Help with stairs or ramp for entrance;A little help with bathing/dressing/bathroom   Equipment Recommendations  None recommended by PT    Recommendations for Other Services       Precautions / Restrictions Precautions Precautions: Fall Precaution Comments: watch HR, BP Restrictions Weight Bearing Restrictions: No     Mobility  Bed Mobility Overal bed mobility: Needs Assistance Bed Mobility: Supine to Sit, Sit to Supine     Supine to  sit: Modified independent (Device/Increase time) Sit to supine: Min assist   General bed mobility comments: Assistance with BLE getting back in bed    Transfers Overall transfer level: Modified independent Equipment used: Rolling walker (2 wheels)                    Ambulation/Gait Ambulation/Gait assistance: Min guard, +2 safety/equipment Gait Distance (Feet): 50 Feet Assistive device: Rolling walker (2 wheels) Gait Pattern/deviations: Step-through pattern, Decreased stride length Gait velocity: decreased     General Gait Details: +2 for safety after episode of pt passing out during previous session. Overall Min guard for mobility. 2 standing rest breaks   Stairs             Wheelchair Mobility    Modified Rankin (Stroke Patients Only)       Balance Overall balance assessment: Mild deficits observed, not formally tested                                          Cognition Arousal/Alertness: Awake/alert Behavior During Therapy: WFL for tasks assessed/performed Overall Cognitive Status: Within Functional Limits for tasks assessed                                          Exercises      General Comments General comments (skin integrity, edema, etc.): Supportive husband in room. Pt and husband report new lower abdominal and groin pain which they  attribute to gait belt being pulled to prevent pt from falling during epiosde from previous session.      Pertinent Vitals/Pain Pain Assessment Pain Assessment: Faces Faces Pain Scale: Hurts little more Pain Location: Groin/Lower abdominal pain (Pt and husband believe it is from gait belt) Pain Descriptors / Indicators: Aching Pain Intervention(s): Monitored during session    Home Living                          Prior Function            PT Goals (current goals can now be found in the care plan section) Progress towards PT goals: Progressing toward goals     Frequency    Min 3X/week      PT Plan Current plan remains appropriate    Co-evaluation              AM-PAC PT "6 Clicks" Mobility   Outcome Measure  Help needed turning from your back to your side while in a flat bed without using bedrails?: None Help needed moving from lying on your back to sitting on the side of a flat bed without using bedrails?: None Help needed moving to and from a bed to a chair (including a wheelchair)?: None Help needed standing up from a chair using your arms (e.g., wheelchair or bedside chair)?: None Help needed to walk in hospital room?: A Little Help needed climbing 3-5 steps with a railing? : Total 6 Click Score: 20    End of Session Equipment Utilized During Treatment: Other (comment) (Pt refused gait belt due to pain around lower abdominal region) Activity Tolerance: Patient tolerated treatment well Patient left: in bed;with call bell/phone within reach;with family/visitor present;with nursing/sitter in room Nurse Communication: Mobility status PT Visit Diagnosis: Muscle weakness (generalized) (M62.81)     Time: [id number] PT Time Calculation (min) (ACUTE ONLY): 15 min  Charges:  $Gait Training: 8-22 mins                     Shelby Mattocks, PT, DPT Acute Rehab Services [id number]    Viann Shove 08/15/2022, 4:34 PM

## 2022-08-15 NOTE — Progress Notes (Signed)
Notified by nurse regarding of possible passing out spell.  Patient is currently being admitted for acute systolic heart failure.  She has a history of DVT on Coumadin which is being switched to Eliquis, PAD and hypertension.  Left and right heart cath showed mild pulmonary hypertension and severe aortic stenosis.  Patient had a witnessed episode of unresponsiveness that lasted a few minutes.  She apparently had a similar event walking with PT on 2/28.  According to nurse and NT, her body was slumped on the side and was mildly shaking.  Presentation does not sound like a tonic-clonic seizure.  According to patient, she had a flushing sensation and significant diaphoresis immediately prior to the event.  Systolic blood pressure dropped down to the 70s and quickly rebounds back to the 120s prior to examination.  Diuretic is being held.  She is on metoprolol succinate 25 mg daily, current blood pressures is 128.  At the time of interview, she appears to be stable.  Heart rate is regular however does have a loud heart murmur. Will continue observation at this time.  Hemoglobin 10.0 this morning.  Last basic metabolic panel was yesterday which showed potassium 3.9, creatinine 0.96

## 2022-08-15 DEATH — deceased

## 2022-08-16 DIAGNOSIS — R7989 Other specified abnormal findings of blood chemistry: Secondary | ICD-10-CM | POA: Diagnosis not present

## 2022-08-16 DIAGNOSIS — R55 Syncope and collapse: Secondary | ICD-10-CM | POA: Diagnosis not present

## 2022-08-16 DIAGNOSIS — I10 Essential (primary) hypertension: Secondary | ICD-10-CM | POA: Diagnosis not present

## 2022-08-16 DIAGNOSIS — I214 Non-ST elevation (NSTEMI) myocardial infarction: Secondary | ICD-10-CM | POA: Diagnosis not present

## 2022-08-16 DIAGNOSIS — I350 Nonrheumatic aortic (valve) stenosis: Secondary | ICD-10-CM | POA: Diagnosis not present

## 2022-08-16 LAB — CBC
HCT: 27.9 % — ABNORMAL LOW (ref 36.0–46.0)
Hemoglobin: 9.6 g/dL — ABNORMAL LOW (ref 12.0–15.0)
MCH: 33.9 pg (ref 26.0–34.0)
MCHC: 34.4 g/dL (ref 30.0–36.0)
MCV: 98.6 fL (ref 80.0–100.0)
Platelets: 264 10*3/uL (ref 150–400)
RBC: 2.83 MIL/uL — ABNORMAL LOW (ref 3.87–5.11)
RDW: 12.8 % (ref 11.5–15.5)
WBC: 10.2 10*3/uL (ref 4.0–10.5)
nRBC: 0 % (ref 0.0–0.2)

## 2022-08-16 LAB — BASIC METABOLIC PANEL
Anion gap: 7 (ref 5–15)
BUN: 21 mg/dL (ref 8–23)
CO2: 23 mmol/L (ref 22–32)
Calcium: 8 mg/dL — ABNORMAL LOW (ref 8.9–10.3)
Chloride: 101 mmol/L (ref 98–111)
Creatinine, Ser: 0.93 mg/dL (ref 0.44–1.00)
GFR, Estimated: 60 mL/min — ABNORMAL LOW (ref >60)
Glucose, Bld: 101 mg/dL — ABNORMAL HIGH (ref 70–99)
Potassium: 3.2 mmol/L — ABNORMAL LOW (ref 3.5–5.1)
Sodium: 131 mmol/L — ABNORMAL LOW (ref 135–145)

## 2022-08-16 MED ORDER — POTASSIUM CHLORIDE CRYS ER 20 MEQ PO TBCR
40.0000 meq | EXTENDED_RELEASE_TABLET | Freq: Once | ORAL | Status: AC
  Administered 2022-08-16: 40 meq via ORAL
  Filled 2022-08-16: qty 2

## 2022-08-16 MED ORDER — MORPHINE SULFATE (PF) 2 MG/ML IV SOLN
1.0000 mg | INTRAVENOUS | Status: DC | PRN
  Administered 2022-08-16 – 2022-08-18 (×8): 1 mg via INTRAVENOUS
  Filled 2022-08-16 (×8): qty 1

## 2022-08-16 NOTE — Progress Notes (Signed)
CARDIAC REHAB PHASE I   Pt resting in bed with husband at bedside. Will hold on ambulation for now, as pt had another syncopal last evening. Will continue to follow for ambulation and any further educational needs.   [id number]      Vanessa Barbara, RN BSN 08/16/2022 10:29 AM

## 2022-08-16 NOTE — Progress Notes (Signed)
Rounding Note    Patient Name: Tracy Peters Date of Encounter: 08/16/2022  [place] Cardiologist: Werner Lean, MD   Subjective   Reviewed episode from last evening. Overall yesterday, she walked once up and down the hall without issues, and another time she walked the hall but was short of breath and needed to rest. Then yesterday evening, she got up to walk to the bathroom. Her son was with her. She reports feeling nauseated after dinner, and then she felt hot/flushed, diaphoretic, and then she remembers being on the ground. No chest pain, no racing beats. Telemetry reviewed, no significant abnormalities. BP initially 75/52 but within minutes was 128/63. Spoke at length about possible etiologies, see below. Continues to feel mildly nauseated this AM, has not been out of bed.  Inpatient Medications    Scheduled Meds:  apixaban  2.5 mg Oral BID   aspirin EC  81 mg Oral Daily   famotidine  20 mg Oral Daily   lacosamide  50 mg Oral BID   metoprolol succinate  25 mg Oral Daily   rosuvastatin  20 mg Oral Daily   sodium chloride flush  3 mL Intravenous Q12H   Continuous Infusions:  sodium chloride     sodium chloride 50 mL/hr at 08/16/22 1011   PRN Meds: sodium chloride, acetaminophen, morphine injection, ondansetron (ZOFRAN) IV, sodium chloride flush, traMADol, zolpidem   Vital Signs    Vitals:   08/16/22 0409 08/16/22 0433 08/16/22 0810 08/16/22 1220  BP: (!) 140/65  (!) 141/61 (!) 142/60  Pulse:   79 82  Resp: '16  18 20  '$ Temp: 97.9 F (36.6 C)  97.8 F (36.6 C) 97.9 F (36.6 C)  TempSrc: Oral  Oral Oral  SpO2: 98%  99% 100%  Weight:  52.7 kg    Height:        Intake/Output Summary (Last 24 hours) at 08/16/2022 1325 Last data filed at 08/16/2022 1011 Gross per 24 hour  Intake 1192.88 ml  Output 950 ml  Net 242.88 ml      08/16/2022    4:33 AM 08/15/2022    4:34 AM 08/14/2022    4:17 AM  Last 3 Weights  Weight (lbs) 116 lb 2.9 oz 121  lb 0.5 oz 120 lb 5.9 oz  Weight (kg) 52.7 kg 54.9 kg 54.6 kg      Telemetry    Normal sinus rhythm, HR 70s - Personally Reviewed  ECG    No new tracings since 2/27 - Personally Reviewed  Physical Exam   GEN: Well nourished, well developed in no acute distress NECK: No JVD CARDIAC: regular rhythm, normal S1 and S2, no rubs or gallops. 3/6 systolic ejection murmur. VASCULAR: Radial pulses 2+ bilaterally.  RESPIRATORY:  Clear to auscultation without rales, wheezing or rhonchi  ABDOMEN: Soft, non-tender, non-distended MUSCULOSKELETAL:  Moves all 4 limbs independently SKIN: Warm and dry, no edema NEUROLOGIC:  No focal neuro deficits noted. PSYCHIATRIC:  Normal affect    Labs    High Sensitivity Troponin:   Recent Labs  Lab 07/19/2022 0900 07/26/2022 1056 07/29/2022 1616 07/19/2022 1743  TROPONINIHS 199* 189* 241* 250*     Chemistry Recent Labs  Lab 08/13/2022 1056 08/11/22 0541 08/02/2022 0213 07/30/2022 1410 08/13/22 0732 08/14/22 0203 08/16/22 0325  NA  --    < > 136   < > 138 135 131*  K  --    < > 3.6   < > 3.8 3.9 3.2*  CL  --    < >  99  --  103 99 101  CO2  --    < > 26  --  '23 24 23  '$ GLUCOSE  --    < > 107*  --  106* 134* 101*  BUN  --    < > 20  --  23 30* 21  CREATININE  --    < > 1.23*  --  0.89 0.96 0.93  CALCIUM  --    < > 8.7*  --  8.8* 8.5* 8.0*  MG 1.8  --  1.7  --   --   --   --   PROT  --   --  6.0*  --   --   --   --   ALBUMIN  --   --  3.0*  --   --   --   --   AST  --   --  18  --   --   --   --   ALT  --   --  11  --   --   --   --   ALKPHOS  --   --  39  --   --   --   --   BILITOT  --   --  0.5  --   --   --   --   GFRNONAA  --    < > 43*  --  >60 58* 60*  ANIONGAP  --    < > 11  --  '12 12 7   '$ < > = values in this interval not displayed.    Lipids  Recent Labs  Lab 08/11/22 0541  CHOL 165  TRIG 65  HDL 48  LDLCALC 104*  CHOLHDL 3.4    Hematology Recent Labs  Lab 08/14/22 0203 08/15/22 0431 08/16/22 0325  WBC 7.8 11.3* 10.2  RBC  3.33* 2.99* 2.83*  HGB 11.1* 10.0* 9.6*  HCT 33.0* 30.1* 27.9*  MCV 99.1 100.7* 98.6  MCH 33.3 33.4 33.9  MCHC 33.6 33.2 34.4  RDW 12.9 12.9 12.8  PLT 271 257 264   Thyroid No results for input(s): "TSH", "FREET4" in the last 168 hours.  BNP Recent Labs  Lab 07/20/2022 0900  BNP 1,585.6*    DDimer  Recent Labs  Lab 07/26/2022 0900  DDIMER 3.00*     Radiology    No results found.  Cardiac Studies   Echocardiogram 08/11/22  1. Left ventricular ejection fraction, by estimation, is 55 to 60%. The  left ventricle has normal function. The left ventricle has no regional  wall motion abnormalities. Left ventricular diastolic function could not  be evaluated. There is severe  hypokinesis of the left ventricular, basal-mid inferoseptal wall and  inferior wall.   2. Right ventricular systolic function is normal. The right ventricular  size is normal. There is normal pulmonary artery systolic pressure. The  estimated right ventricular systolic pressure is [id number] mmHg.   3. The mitral valve is degenerative. Mild mitral valve regurgitation. No  evidence of mitral stenosis. Moderate mitral annular calcification.   4. The aortic valve is calcified. There is severe calcifcation of the  aortic valve. There is severe thickening of the aortic valve. Aortic valve  regurgitation is not visualized. Severe aortic valve stenosis. Aortic  valve area, by VTI measures 0.46 cm.  Aortic valve mean gradient measures 18.0 mmHg. Aortic valve Vmax measures  3.02 m/s. Although the mean AVG is mild Vmax is not in the severe range,  the SVI is low at 15 and DVI 0.18. These findings are consistent with  paradoxical low flow low gradient  severe AS.   5. The inferior vena cava is normal in size with greater than 50%  respiratory variability, suggesting right atrial pressure of 3 mmHg.   Right/Left Heart Catheterization 08/04/2022 1.  Mild aorto ostial disease involving the ostial RCA and the ostial left main  with less than 50% stenosis at both lesion sites 2.  Patent LAD, left circumflex, and RCA without any high-grade stenoses 3.  Mild pulmonary hypertension with mean PA pressure 26 mmHg, mildly increased wedge pressure of 19 mmHg, and otherwise normal right heart hemodynamics.  Preserved cardiac output and index of 4.6 L/min and 2.99 L/min/m 4.  Known severe calcific aortic stenosis with heavy calcification and restriction of the aortic valve leaflets seen on plain fluoroscopy   Recommendations: Resume heparin in 8 hours.  Continue TAVR evaluation with CT angiogram studies and formal cardiac surgical consultation as previously outlined  Patient Profile     89 y.o. female with a past medical history of HTN, HLD, PAD, prior DVT, mild carotid disease. Patient presented with worsening chest pain and shortness of breath. Found to have acute heart failure exacerbation in the setting of severe AS. Ongoing TAVR workup   Assessment & Plan    Syncope -this was her third episode in recent days -I am concerned that this may have been more vagal in etiology. She had been diuresed (now held), was nauseated, had prodrome of hot flash/diaphoresis and lightheadedness. No arrhythmia on telemetry. Initial BP low, then improved. -if this were only AS, would expect it to be a consistent problem, but she has been able to ambulate as recently as yesterday without issues -agree with gentle fluids, nausea control -if she feels better after lunch, would trial orthostatic vitals -will get compression socks -she was lightheaded even sitting up in the bed -episode 2/28 was associated with chest pain and SVT -also has history of seizures  Severe Paradoxical Aortic Stenosis  - Echocardiogram this admission showed severe calcification of the aortic valve with severe stenosis. Mean gradient 18.0 mmHg. Vmax 3.02 m/s. Findings were consistent with paradoxical low flow low gradient severe AS - R/L heart catheterization this  admission showed severe calcific aortic stenosis with heavy calcification and restriction of the aortic valve leaflets  - Patient evaluated by the structural heart team with plans for TAVR on 3/12. Underwent CT scan, cath this admission - Patient did have an episode of chest pain on 2/26 and was given SL nitro. Asking if she can have SL nitro for home, but she should not be given a prescription given concerns for hypotension with severe AS.   SVT  - On 2/28, patient was working with PT when she became tachycardic on telemetry. She went back to her room and became minimally responsive, HR decreased to the 40s. Then, she had an episode of SVT with HR to the 150s  - Per telemetry, patient has not had recurrence of SVT and is maintaining NSR   Acute on Chronic Diastolic Heart Failure  - Decompensation driven by severe AS  - Echo this admission with EF 55-60%, severe AS as above  - RHC 2/27 with wedge 19 - hold diuretics, agree with gentle fluids today - Continue metoprolol succinate 25 mg daily   NSTEMI  - hsTn [id number]  - Cardiac catheterization on 2/27 showed mild aorto ostial disease involving the ostial RCA and the ostial left  main with less than 50% stenosis at both lesion sites. LAD, left circumflex, and RCA were without high-grade stenoses.  - Suspect troponin elevation is demand ischemia in the setting of CHF and AS  - Continue daily ASA, crestor 20 mg daily, metoprolol succinate 25 mg daily   HTN  - BP well controlled on metoprolol - Holding losartan, amlodipine, clonidine to prevent hypotension given severe AS   History of DVT  - Patient was on coumadin PTA  - While admitted, patient was on IV heparin.  - Started on [place] for ease of holding prior to TAVR  For questions or updates, please contact [place] Please consult [email or web address] for contact info under     Signed, Buford Dresser, MD  08/16/2022, 1:25 PM

## 2022-08-16 NOTE — Progress Notes (Signed)
Mobility Specialist Progress Note:   08/16/22 1214  Mobility  Activity Refused mobility   Pt in bed stating she isn't getting up because she is scared to have another vasovagal response. Will follow-up as time allows.   Gareth Eagle Jeanene Mena Mobility Specialist Please contact via [employer] or  Rehab Office at [phone]

## 2022-08-16 NOTE — Progress Notes (Signed)
PROGRESS NOTE    Tracy Peters  [id number] DOB: 10/30/1933 DOA: 08/03/2022 PCP: Cassandria Anger, MD  Chief Complaint  Patient presents with   Chest Pain   Wheezing    Brief Narrative:   Tracy Peters is a 89 y.o. female past medical history of DVT on Coumadin, peripheral vascular disease & hypertension who presented to the ED on 2/25 for shortness of breath x 2 weeks, progressively worsening as well as 2 days of chest pain at rest.  The emergency room, patient found to have new onset heart failure with a BNP of 1500 and chest x-ray noting pulmonary edema.  Patient admitted for treatment of new onset acute congestive heart failure.  Cardiology consulted.  Workup revealed severe aortic stenosis.  Patient underwent left and right heart catheterization revealing mild pulmonary hypertension and confirming severe calcific aortic stenosis.  CT surgery consulted and are recommending TAVR.  Unfortunately, this cannot be done until 3/12.  Initial plan had been to discharge patient on 3/2 after monitoring.  On evening of 3/1, patient started having episodes of what appear to be orthostatic hypotension with drops in blood pressure (systolic in the Q000111Q) and elevated heart rates increased to 15-20 beats with sitting or standing upright.  Patient did not pass out, but was near syncopal.  This was markedly different from earlier in the day when she was walking around with walker.  Lasix stopped and patient started on gentle fluid.  Symptoms persisting into 3/2.  Assessment & Plan:   Principal Problem:   Elevated troponin I level Active Problems:   Essential hypertension   NSTEMI (non-ST elevated myocardial infarction) (HCC)   Elevated d-dimer   Elevated troponin   Severe aortic stenosis   Vasovagal near syncope  Orthostatic hypotension Appreciate cardiology help.  Not clear if this is solely just from her advanced aortic valve, given change and sudden presentation starting on  evening of 3/1.  Lasix held and patient given gentle fluids.  Cardiology following, consideration for repeat echocardiogram.  Adding compression hose.  Plans to discharge put on hold  Shortness of Breath HFpEF  secondary to severe aortic Stenosis Appreciate cardiology help.  IV Lasix changed to p.o.  Status post left and right heart catheterization.  Due to surgery recommending TAVR, which cannot be done until 3/12.   Chest Pain  NSTEMI  Elevated Troponin Appreciate cardiology recommendations.  Elevated troponins initially thought to be demand ischemia in the setting of heart failure and aortic stenosis, but wall motion abnormalities on 2D echo concerning for underlying CAD.  Status post left and right heart catheterization with no significant coronary lesions noted.  Continue heparin infusion plus aspirin.  Continue Crestor, LDL at 104 with target below 70.  Continue antihypertensives   Essential hypertension Metoprolol, losartan  Left hip pain Appears to be below left lower quadrant of abdomen, above the groin.  Tender to deep palpation.  Patient able to do straight leg raising and externally obturated.  As patient relates, patient had weight belt done by PT and when she started to fall, belt was used to help hold her and prevent her from falling.  Pain control for now.  Continue to monitor, expect this should improve on its own  Hx DVT Previously was on Coumadin and then Coumadin on hold and changed over to heparin.  Cardiology has since changed to Eliquis for better management  Abnormal CXR No infectious symptoms, monitor - suspect related to HF above Repeat as indicated   DVT  prophylaxis: Eliquis Code Status: full Family Communication: Will call family Disposition:   Status is: Inpatient Remains inpatient appropriate because:  -Treatment of new onset orthostatic hypotension   Consultants:  Cardiology Cardiothoracic surgery  Procedures:  Status post left and right heart  catheterization  Echo IMPRESSIONS     1. Left ventricular ejection fraction, by estimation, is 55 to 60%. The  left ventricle has normal function. The left ventricle has no regional  wall motion abnormalities. Left ventricular diastolic function could not  be evaluated. There is severe  hypokinesis of the left ventricular, basal-mid inferoseptal wall and  inferior wall.   2. Right ventricular systolic function is normal. The right ventricular  size is normal. There is normal pulmonary artery systolic pressure. The  estimated right ventricular systolic pressure is Q000111Q mmHg.   3. The mitral valve is degenerative. Mild mitral valve regurgitation. No  evidence of mitral stenosis. Moderate mitral annular calcification.   4. The aortic valve is calcified. There is severe calcifcation of the  aortic valve. There is severe thickening of the aortic valve. Aortic valve  regurgitation is not visualized. Severe aortic valve stenosis. Aortic  valve area, by VTI measures 0.46 cm.  Aortic valve mean gradient measures 18.0 mmHg. Aortic valve Vmax measures  3.02 m/s. Although the mean AVG is mild Vmax is not in the severe range,  the SVI is low at 15 and DVI 0.18. These findings are consistent with  paradoxical low flow low gradient  severe AS.   5. The inferior vena cava is normal in size with greater than 50%  respiratory variability, suggesting right atrial pressure of 3 mmHg.   Antimicrobials:  Anti-infectives (From admission, onward)    Start     Dose/Rate Route Frequency Ordered Stop   07/18/2022 1145  cefTRIAXone (ROCEPHIN) 1 g in sodium chloride 0.9 % 100 mL IVPB        1 g 200 mL/hr over 30 Minutes Intravenous  Once 07/22/2022 1132 07/22/2022 1313   07/28/2022 1145  doxycycline (VIBRA-TABS) tablet 100 mg        100 mg Oral  Once 07/29/2022 1132 07/22/2022 1236       Subjective: Tired, complains of some left lower quadrant abdominal pain right above the groin.  Objective: Vitals:    08/16/22 0409 08/16/22 0433 08/16/22 0810 08/16/22 1220  BP: (!) 140/65  (!) 141/61 (!) 142/60  Pulse:   79 82  Resp: '16  18 20  '$ Temp: 97.9 F (36.6 C)  97.8 F (36.6 C) 97.9 F (36.6 C)  TempSrc: Oral  Oral Oral  SpO2: 98%  99% 100%  Weight:  52.7 kg    Height:        Intake/Output Summary (Last 24 hours) at 08/16/2022 1422 Last data filed at 08/16/2022 1011 Gross per 24 hour  Intake 1192.88 ml  Output 950 ml  Net 242.88 ml    Filed Weights   08/14/22 0417 08/15/22 0434 08/16/22 0433  Weight: 54.6 kg 54.9 kg 52.7 kg    Examination:  General: Alert and oriented x 3, no acute distress.  Lightheaded with some diaphoresis when sitting upright Cardiovascular: Regular rate and rhythm, S1-S2, 3 out of 6 systolic ejection murmur, mildly tachycardic when sitting upright Lungs: Poor inspiratory effort, otherwise clear to auscultation bilaterally Abdomen: Soft, tenderness right below her left lower quadrant, above the groin (appears to be more musculoskeletal), nondistended, positive bowel sounds Neurological: No focal deficits Extremities: No clubbing or cyanosis.  Trace pitting edema.  Data Reviewed: I have personally reviewed following labs and imaging studies  CBC: Recent Labs  Lab 07/22/2022 0213 07/27/2022 1410 07/23/2022 1415 08/13/22 0219 08/14/22 0203 08/15/22 0431 08/16/22 0325  WBC 8.2  --   --  4.1 7.8 11.3* 10.2  NEUTROABS 4.8  --   --   --   --   --   --   HGB 10.9*   < > 11.6* 11.5* 11.1* 10.0* 9.6*  HCT 32.7*   < > 34.0* 33.7* 33.0* 30.1* 27.9*  MCV 100.3*  --   --  98.8 99.1 100.7* 98.6  PLT 264  --   --  276 271 257 264   < > = values in this interval not displayed.     Basic Metabolic Panel: Recent Labs  Lab 07/19/2022 1056 08/11/22 0541 07/18/2022 0213 07/21/2022 1410 07/18/2022 1411 08/09/2022 1415 08/13/22 0732 08/14/22 0203 08/16/22 0325  NA  --  135 136   < > 139 137 138 135 131*  K  --  3.6 3.6   < > 3.6 3.5 3.8 3.9 3.2*  CL  --  100 99  --   --    --  103 99 101  CO2  --  24 26  --   --   --  '23 24 23  '$ GLUCOSE  --  100* 107*  --   --   --  106* 134* 101*  BUN  --  15 20  --   --   --  23 30* 21  CREATININE  --  1.08* 1.23*  --   --   --  0.89 0.96 0.93  CALCIUM  --  8.5* 8.7*  --   --   --  8.8* 8.5* 8.0*  MG 1.8  --  1.7  --   --   --   --   --   --   PHOS  --   --  4.1  --   --   --   --   --   --    < > = values in this interval not displayed.     GFR: Estimated Creatinine Clearance: 36.1 mL/min (by C-G formula based on SCr of 0.93 mg/dL).  Liver Function Tests: Recent Labs  Lab 08/03/2022 0213  AST 18  ALT 11  ALKPHOS 39  BILITOT 0.5  PROT 6.0*  ALBUMIN 3.0*     CBG: Recent Labs  Lab 07/29/2022 2146  GLUCAP 122*      Recent Results (from the past 240 hour(s))  Resp panel by RT-PCR (RSV, Flu A&B, Covid) Anterior Nasal Swab     Status: None   Collection Time: 08/08/2022  9:12 AM   Specimen: Anterior Nasal Swab  Result Value Ref Range Status   SARS Coronavirus 2 by RT PCR NEGATIVE NEGATIVE Final    Comment: (NOTE) SARS-CoV-2 target nucleic acids are NOT DETECTED.  The SARS-CoV-2 RNA is generally detectable in upper respiratory specimens during the acute phase of infection. The lowest concentration of SARS-CoV-2 viral copies this assay can detect is 138 copies/mL. A negative result does not preclude SARS-Cov-2 infection and should not be used as the sole basis for treatment or other patient management decisions. A negative result may occur with  improper specimen collection/handling, submission of specimen other than nasopharyngeal swab, presence of viral mutation(s) within the areas targeted by this assay, and inadequate number of viral copies(<138 copies/mL). A negative result must be combined with clinical observations, patient history,  and epidemiological information. The expected result is Negative.  Fact Sheet for Patients:  EntrepreneurPulse.com.au  Fact Sheet for Healthcare  Providers:  IncredibleEmployment.be  This test is no t yet approved or cleared by the [place] FDA and  has been authorized for detection and/or diagnosis of SARS-CoV-2 by FDA under an Emergency Use Authorization (EUA). This EUA will remain  in effect (meaning this test can be used) for the duration of the COVID-19 declaration under Section 564(b)(1) of the Act, 21 U.S.C.section 360bbb-3(b)(1), unless the authorization is terminated  or revoked sooner.       Influenza A by PCR NEGATIVE NEGATIVE Final   Influenza B by PCR NEGATIVE NEGATIVE Final    Comment: (NOTE) The Xpert Xpress SARS-CoV-2/FLU/RSV plus assay is intended as an aid in the diagnosis of influenza from Nasopharyngeal swab specimens and should not be used as a sole basis for treatment. Nasal washings and aspirates are unacceptable for Xpert Xpress SARS-CoV-2/FLU/RSV testing.  Fact Sheet for Patients: EntrepreneurPulse.com.au  Fact Sheet for Healthcare Providers: IncredibleEmployment.be  This test is not yet approved or cleared by the [place] FDA and has been authorized for detection and/or diagnosis of SARS-CoV-2 by FDA under an Emergency Use Authorization (EUA). This EUA will remain in effect (meaning this test can be used) for the duration of the COVID-19 declaration under Section 564(b)(1) of the Act, 21 U.S.C. section 360bbb-3(b)(1), unless the authorization is terminated or revoked.     Resp Syncytial Virus by PCR NEGATIVE NEGATIVE Final    Comment: (NOTE) Fact Sheet for Patients: EntrepreneurPulse.com.au  Fact Sheet for Healthcare Providers: IncredibleEmployment.be  This test is not yet approved or cleared by the [place] FDA and has been authorized for detection and/or diagnosis of SARS-CoV-2 by FDA under an Emergency Use Authorization (EUA). This EUA will remain in effect (meaning this test can be  used) for the duration of the COVID-19 declaration under Section 564(b)(1) of the Act, 21 U.S.C. section 360bbb-3(b)(1), unless the authorization is terminated or revoked.  Performed at [facility], [address] [place], [place] [address]   MRSA Next Gen by PCR, Nasal     Status: None   Collection Time: 08/11/22 11:57 PM   Specimen: Nasal Mucosa; Nasal Swab  Result Value Ref Range Status   MRSA by PCR Next Gen NOT DETECTED NOT DETECTED Final    Comment: (NOTE) The GeneXpert MRSA Assay (FDA approved for NASAL specimens only), is one component of a comprehensive MRSA colonization surveillance program. It is not intended to diagnose MRSA infection nor to guide or monitor treatment for MRSA infections. Test performance is not FDA approved in patients less than 46 years old. Performed at [facility], [place] [address] [place], [place] [address]          Radiology Studies: No results found.      Scheduled Meds:  apixaban  2.5 mg Oral BID   aspirin EC  81 mg Oral Daily   famotidine  20 mg Oral Daily   lacosamide  50 mg Oral BID   metoprolol succinate  25 mg Oral Daily   rosuvastatin  20 mg Oral Daily   sodium chloride flush  3 mL Intravenous Q12H   Continuous Infusions:  sodium chloride     sodium chloride 50 mL/hr at 08/16/22 1416     LOS: 6 days      Annita Brod, MD [facility]   To contact the attending provider between 7A-7P or the covering provider  during after hours 7P-7A, please log into the web site [email or web address] and access using universal [place] password for that web site. If you do not have the password, please call the hospital operator.  08/16/2022, 2:22 PM

## 2022-08-16 NOTE — Progress Notes (Signed)
Attempted to get orthostatic VS.  Patient tolerated sitting on side of the bed but BP dropped to 81/52 with standing and patient felt like she was passing out.  Returned to sitting position patient pale and sweaty.  Patient placed in supine position and BP 142/71, HR 80s.  Dr. Maryland Pink notified.

## 2022-08-17 ENCOUNTER — Inpatient Hospital Stay (HOSPITAL_COMMUNITY): Payer: Medicare Other

## 2022-08-17 DIAGNOSIS — D649 Anemia, unspecified: Secondary | ICD-10-CM

## 2022-08-17 DIAGNOSIS — I10 Essential (primary) hypertension: Secondary | ICD-10-CM | POA: Diagnosis not present

## 2022-08-17 DIAGNOSIS — I5033 Acute on chronic diastolic (congestive) heart failure: Secondary | ICD-10-CM | POA: Diagnosis not present

## 2022-08-17 DIAGNOSIS — R7989 Other specified abnormal findings of blood chemistry: Secondary | ICD-10-CM | POA: Diagnosis not present

## 2022-08-17 DIAGNOSIS — I350 Nonrheumatic aortic (valve) stenosis: Secondary | ICD-10-CM | POA: Diagnosis not present

## 2022-08-17 DIAGNOSIS — I110 Hypertensive heart disease with heart failure: Secondary | ICD-10-CM | POA: Diagnosis not present

## 2022-08-17 DIAGNOSIS — Z1152 Encounter for screening for COVID-19: Secondary | ICD-10-CM | POA: Diagnosis not present

## 2022-08-17 DIAGNOSIS — R55 Syncope and collapse: Secondary | ICD-10-CM | POA: Diagnosis not present

## 2022-08-17 DIAGNOSIS — I21A1 Myocardial infarction type 2: Secondary | ICD-10-CM | POA: Diagnosis not present

## 2022-08-17 LAB — BASIC METABOLIC PANEL
Anion gap: 9 (ref 5–15)
Anion gap: 11 (ref 5–15)
BUN: 27 mg/dL — ABNORMAL HIGH (ref 8–23)
BUN: 27 mg/dL — ABNORMAL HIGH (ref 8–23)
CO2: 21 mmol/L — ABNORMAL LOW (ref 22–32)
CO2: 20 mmol/L — ABNORMAL LOW (ref 22–32)
Calcium: 8.2 mg/dL — ABNORMAL LOW (ref 8.9–10.3)
Calcium: 8.3 mg/dL — ABNORMAL LOW (ref 8.9–10.3)
Chloride: 98 mmol/L (ref 98–111)
Chloride: 97 mmol/L — ABNORMAL LOW (ref 98–111)
Creatinine, Ser: 1.34 mg/dL — ABNORMAL HIGH (ref 0.44–1.00)
Creatinine, Ser: 1.44 mg/dL — ABNORMAL HIGH (ref 0.44–1.00)
GFR, Estimated: 39 mL/min — ABNORMAL LOW (ref >60)
GFR, Estimated: 35 mL/min — ABNORMAL LOW (ref >60)
Glucose, Bld: 154 mg/dL — ABNORMAL HIGH (ref 70–99)
Glucose, Bld: 139 mg/dL — ABNORMAL HIGH (ref 70–99)
Potassium: 5.3 mmol/L — ABNORMAL HIGH (ref 3.5–5.1)
Potassium: 4.6 mmol/L (ref 3.5–5.1)
Sodium: 128 mmol/L — ABNORMAL LOW (ref 135–145)
Sodium: 128 mmol/L — ABNORMAL LOW (ref 135–145)

## 2022-08-17 LAB — CBC WITH DIFFERENTIAL/PLATELET
Abs Immature Granulocytes: 0.12 10*3/uL — ABNORMAL HIGH (ref 0.00–0.07)
Basophils Absolute: 0 10*3/uL (ref 0.0–0.1)
Basophils Relative: 0 %
Eosinophils Absolute: 0 10*3/uL (ref 0.0–0.5)
Eosinophils Relative: 0 %
HCT: 22 % — ABNORMAL LOW (ref 36.0–46.0)
Hemoglobin: 7.6 g/dL — ABNORMAL LOW (ref 12.0–15.0)
Immature Granulocytes: 1 %
Lymphocytes Relative: 8 %
Lymphs Abs: 1.6 10*3/uL (ref 0.7–4.0)
MCH: 34.2 pg — ABNORMAL HIGH (ref 26.0–34.0)
MCHC: 34.5 g/dL (ref 30.0–36.0)
MCV: 99.1 fL (ref 80.0–100.0)
Monocytes Absolute: 1.8 10*3/uL — ABNORMAL HIGH (ref 0.1–1.0)
Monocytes Relative: 10 %
Neutro Abs: 15.4 10*3/uL — ABNORMAL HIGH (ref 1.7–7.7)
Neutrophils Relative %: 81 %
Platelets: 290 10*3/uL (ref 150–400)
RBC: 2.22 MIL/uL — ABNORMAL LOW (ref 3.87–5.11)
RDW: 13 % (ref 11.5–15.5)
WBC: 19 10*3/uL — ABNORMAL HIGH (ref 4.0–10.5)
nRBC: 0.1 % (ref 0.0–0.2)

## 2022-08-17 LAB — BRAIN NATRIURETIC PEPTIDE: B Natriuretic Peptide: 1127.1 pg/mL — ABNORMAL HIGH (ref 0.0–100.0)

## 2022-08-17 LAB — CBC
HCT: 23.9 % — ABNORMAL LOW (ref 36.0–46.0)
Hemoglobin: 8.1 g/dL — ABNORMAL LOW (ref 12.0–15.0)
MCH: 33.8 pg (ref 26.0–34.0)
MCHC: 33.9 g/dL (ref 30.0–36.0)
MCV: 99.6 fL (ref 80.0–100.0)
Platelets: 300 10*3/uL (ref 150–400)
RBC: 2.4 MIL/uL — ABNORMAL LOW (ref 3.87–5.11)
RDW: 12.9 % (ref 11.5–15.5)
WBC: 18 10*3/uL — ABNORMAL HIGH (ref 4.0–10.5)
nRBC: 0 % (ref 0.0–0.2)

## 2022-08-17 LAB — PREPARE RBC (CROSSMATCH)

## 2022-08-17 LAB — PROCALCITONIN: Procalcitonin: 0.24 ng/mL

## 2022-08-17 LAB — HEMOGLOBIN AND HEMATOCRIT, BLOOD
HCT: 29.9 % — ABNORMAL LOW (ref 36.0–46.0)
Hemoglobin: 10.6 g/dL — ABNORMAL LOW (ref 12.0–15.0)

## 2022-08-17 MED ORDER — CYCLOBENZAPRINE HCL 10 MG PO TABS
5.0000 mg | ORAL_TABLET | Freq: Three times a day (TID) | ORAL | Status: DC | PRN
  Administered 2022-08-17 – 2022-08-18 (×4): 5 mg via ORAL
  Filled 2022-08-17 (×4): qty 1

## 2022-08-17 MED ORDER — FUROSEMIDE 10 MG/ML IJ SOLN
20.0000 mg | Freq: Once | INTRAMUSCULAR | Status: AC
  Administered 2022-08-17: 20 mg via INTRAVENOUS
  Filled 2022-08-17: qty 2

## 2022-08-17 MED ORDER — DIPHENHYDRAMINE HCL 50 MG/ML IJ SOLN: 50.0000 mg | Freq: Once | INTRAMUSCULAR | Status: DC

## 2022-08-17 MED ORDER — DIPHENHYDRAMINE HCL 25 MG PO CAPS: 50.0000 mg | ORAL_CAPSULE | Freq: Once | ORAL | Status: DC

## 2022-08-17 MED ORDER — METHYLPREDNISOLONE SODIUM SUCC 40 MG IJ SOLR: 40.0000 mg | Freq: Once | INTRAMUSCULAR | Status: DC

## 2022-08-17 MED ORDER — SODIUM CHLORIDE 0.9% IV SOLUTION: Freq: Once | INTRAVENOUS | Status: AC

## 2022-08-17 NOTE — Progress Notes (Signed)
Rounding Note    Patient Name: Tracy Peters Date of Encounter: 08/17/2022  [place] Cardiologist: Werner Lean, MD   Subjective   Yesterday attempted orthostatic vital signs. When she stood, BP dropped to 81/52 with near syncope. When laid supine, her BP was 142/71. Normal rhythm during that time.   Today Cr worse at 1.44; received IV fluids yesterday. Hgb down to 7.6 this AM, has been drifting down without clear source of bleeding.  Denies any chest pain or dyspnea.  Reports continues to have lightheadedness with standing.   Inpatient Medications    Scheduled Meds:  apixaban  2.5 mg Oral BID   aspirin EC  81 mg Oral Daily   famotidine  20 mg Oral Daily   furosemide  20 mg Intravenous Once   lacosamide  50 mg Oral BID   metoprolol succinate  25 mg Oral Daily   rosuvastatin  20 mg Oral Daily   sodium chloride flush  3 mL Intravenous Q12H   Continuous Infusions:  sodium chloride     PRN Meds: sodium chloride, acetaminophen, cyclobenzaprine, morphine injection, ondansetron (ZOFRAN) IV, sodium chloride flush, traMADol, zolpidem   Vital Signs    Vitals:   08/17/22 0514 08/17/22 0845 08/17/22 1040 08/17/22 1055  BP: 130/63 (!) 145/63 (!) 146/64 (!) 156/69  Pulse: 98 92 94 92  Resp:   18 16  Temp: 97.7 F (36.5 C)  (!) 97 F (36.1 C) (!) 97.2 F (36.2 C)  TempSrc: Oral  Oral Axillary  SpO2: 96% 96% 99% 93%  Weight:      Height:        Intake/Output Summary (Last 24 hours) at 08/17/2022 1149 Last data filed at 08/17/2022 0600 Gross per 24 hour  Intake 1468.22 ml  Output 600 ml  Net 868.22 ml      08/16/2022    4:33 AM 08/15/2022    4:34 AM 08/14/2022    4:17 AM  Last 3 Weights  Weight (lbs) 116 lb 2.9 oz 121 lb 0.5 oz 120 lb 5.9 oz  Weight (kg) 52.7 kg 54.9 kg 54.6 kg      Telemetry    Normal sinus rhythm - Personally Reviewed  ECG    Nsr at 98 bpm, change from prior with downward deflection in lead 3 and change in aVF QRS,  otherwise largely similar to prior- Personally Reviewed  Physical Exam   GEN: Well nourished, well developed in no acute distress NECK: No JVD CARDIAC: regular rhythm, normal S1 and S2, no rubs or gallops. 3/6 systolic ejection murmur. VASCULAR: Radial pulses 2+ bilaterally.  RESPIRATORY:  Clear to auscultation without rales, wheezing or rhonchi  ABDOMEN: Soft, non-tender, non-distended MUSCULOSKELETAL:  Moves all 4 limbs independently SKIN: Warm and dry, no edema NEUROLOGIC:  No focal neuro deficits noted. PSYCHIATRIC:  Normal affect    Labs    High Sensitivity Troponin:   Recent Labs  Lab 07/23/2022 0900 08/11/2022 1056 07/17/2022 1616 07/30/2022 1743  TROPONINIHS 199* 189* 241* 250*     Chemistry Recent Labs  Lab 07/27/2022 0213 08/09/2022 1410 08/16/22 0325 08/17/22 0311 08/17/22 0612  NA 136   < > 131* 128* 128*  K 3.6   < > 3.2* 5.3* 4.6  CL 99   < > 101 98 97*  CO2 26   < > 23 21* 20*  GLUCOSE 107*   < > 101* 154* 139*  BUN 20   < > 21 27* 27*  CREATININE 1.23*   < >  0.93 1.34* 1.44*  CALCIUM 8.7*   < > 8.0* 8.2* 8.3*  MG 1.7  --   --   --   --   PROT 6.0*  --   --   --   --   ALBUMIN 3.0*  --   --   --   --   AST 18  --   --   --   --   ALT 11  --   --   --   --   ALKPHOS 39  --   --   --   --   BILITOT 0.5  --   --   --   --   GFRNONAA 43*   < > 60* 39* 35*  ANIONGAP 11   < > '7 9 11   '$ < > = values in this interval not displayed.    Lipids  Recent Labs  Lab 08/11/22 0541  CHOL 165  TRIG 65  HDL 48  LDLCALC 104*  CHOLHDL 3.4    Hematology Recent Labs  Lab 08/16/22 0325 08/17/22 0311 08/17/22 0612  WBC 10.2 18.0* 19.0*  RBC 2.83* 2.40* 2.22*  HGB 9.6* 8.1* 7.6*  HCT 27.9* 23.9* 22.0*  MCV 98.6 99.6 99.1  MCH 33.9 33.8 34.2*  MCHC 34.4 33.9 34.5  RDW 12.8 12.9 13.0  PLT 264 300 290   Thyroid No results for input(s): "TSH", "FREET4" in the last 168 hours.  BNP Recent Labs  Lab 08/17/22 0927  BNP 1,127.1*    DDimer  No results for  input(s): "DDIMER" in the last 168 hours.    Radiology    No results found.  Cardiac Studies   Echocardiogram 08/11/22  1. Left ventricular ejection fraction, by estimation, is 55 to 60%. The  left ventricle has normal function. The left ventricle has no regional  wall motion abnormalities. Left ventricular diastolic function could not  be evaluated. There is severe  hypokinesis of the left ventricular, basal-mid inferoseptal wall and  inferior wall.   2. Right ventricular systolic function is normal. The right ventricular  size is normal. There is normal pulmonary artery systolic pressure. The  estimated right ventricular systolic pressure is Q000111Q mmHg.   3. The mitral valve is degenerative. Mild mitral valve regurgitation. No  evidence of mitral stenosis. Moderate mitral annular calcification.   4. The aortic valve is calcified. There is severe calcifcation of the  aortic valve. There is severe thickening of the aortic valve. Aortic valve  regurgitation is not visualized. Severe aortic valve stenosis. Aortic  valve area, by VTI measures 0.46 cm.  Aortic valve mean gradient measures 18.0 mmHg. Aortic valve Vmax measures  3.02 m/s. Although the mean AVG is mild Vmax is not in the severe range,  the SVI is low at 15 and DVI 0.18. These findings are consistent with  paradoxical low flow low gradient  severe AS.   5. The inferior vena cava is normal in size with greater than 50%  respiratory variability, suggesting right atrial pressure of 3 mmHg.   Right/Left Heart Catheterization 07/24/2022 1.  Mild aorto ostial disease involving the ostial RCA and the ostial left main with less than 50% stenosis at both lesion sites 2.  Patent LAD, left circumflex, and RCA without any high-grade stenoses 3.  Mild pulmonary hypertension with mean PA pressure 26 mmHg, mildly increased wedge pressure of 19 mmHg, and otherwise normal right heart hemodynamics.  Preserved cardiac output and index of 4.6  L/min and  2.99 L/min/m 4.  Known severe calcific aortic stenosis with heavy calcification and restriction of the aortic valve leaflets seen on plain fluoroscopy   Recommendations: Resume heparin in 8 hours.  Continue TAVR evaluation with CT angiogram studies and formal cardiac surgical consultation as previously outlined  Patient Profile     89 y.o. female with a past medical history of HTN, HLD, PAD, prior DVT, mild carotid disease. Patient presented with worsening chest pain and shortness of breath. Found to have acute heart failure exacerbation in the setting of severe AS. Ongoing TAVR workup   Assessment & Plan    Syncope -Orthostatics positive -received IV fluids yesterday with some improvement.  Her Hgb has been trending down of unclear source, plan for transfusion today -using compression socks  Anemia Hgb has trended down from 11.1 to 7.6 over last several days.  Unclear source.  She denies any overt bleeding.  Cath last week was radial access.  She did have fall with PT and complaining of left lower abdominal pain.  This was discussed with Dr Vernell Barrier, planning CT ab/pelvis.  If unremarkable, would recommend GI evaluation  Severe Paradoxical Aortic Stenosis  - Echocardiogram this admission showed severe calcification of the aortic valve with severe stenosis. Mean gradient 18.0 mmHg. Vmax 3.02 m/s. Findings were consistent with paradoxical low flow low gradient severe AS - R/L heart catheterization this admission showed severe calcific aortic stenosis with heavy calcification and restriction of the aortic valve leaflets  - Patient evaluated by the structural heart team with plans for TAVR on 3/12. Underwent CT scan, cath this admission - Patient did have an episode of chest pain on 2/26 and was given SL nitro.Would avoid further NTG given hypotension with severe AS.   SVT  - On 2/28, patient was working with PT when she became tachycardic on telemetry. She went back to her room and  became minimally responsive, HR decreased to the 40s. Then, she had an episode of SVT with HR to the 150s  - Per telemetry, patient has not had recurrence of SVT and is maintaining NSR   Acute on Chronic Diastolic Heart Failure  - Decompensation driven by severe AS  - Echo this admission with EF 55-60%, severe AS as above  - RHC 2/27 with wedge 19. Had been about a liter negative.  - held diuretics and gave gentle fluids with orthostasis.  - Continue metoprolol succinate 25 mg daily   NSTEMI  - hsTn [id number]  - Cardiac catheterization on 2/27 showed mild aorto ostial disease involving the ostial RCA and the ostial left main with less than 50% stenosis at both lesion sites. LAD, left circumflex, and RCA were without high-grade stenoses.  - Suspect troponin elevation is demand ischemia in the setting of CHF and AS  - Continue daily ASA, crestor 20 mg daily, metoprolol succinate 25 mg daily   HTN  - BP well controlled on metoprolol - Holding losartan, amlodipine, clonidine to prevent hypotension given severe AS   History of DVT  - Patient was on coumadin PTA  - While admitted, patient was on IV heparin.  - Started on Doac for ease of holding prior to TAVR  For questions or updates, please contact [place] Please consult [email or web address] for contact info under     Signed, Donato Heinz, MD  08/17/2022, 11:49 AM

## 2022-08-17 NOTE — Plan of Care (Signed)
  Problem: Education: Goal: Knowledge of General Education information will improve Description: Including pain rating scale, medication(s)/side effects and non-pharmacologic comfort measures Outcome: Progressing   Problem: Health Behavior/Discharge Planning: Goal: Ability to manage health-related needs will improve Outcome: Progressing   Problem: Clinical Measurements: Goal: Cardiovascular complication will be avoided Outcome: Progressing   Problem: Activity: Goal: Risk for activity intolerance will decrease Outcome: Progressing   Problem: Coping: Goal: Level of anxiety will decrease Outcome: Progressing   Problem: Coping: Goal: Level of anxiety will decrease Outcome: Progressing   Problem: Pain Managment: Goal: General experience of comfort will improve Outcome: Progressing   Problem: Safety: Goal: Ability to remain free from injury will improve Outcome: Progressing   Problem: Cardiovascular: Goal: Ability to achieve and maintain adequate cardiovascular perfusion will improve Outcome: Progressing Goal: Vascular access site(s) Level 0-1 will be maintained Outcome: Progressing

## 2022-08-17 NOTE — Progress Notes (Signed)
PROGRESS NOTE    Tracy Peters  [id number] DOB: 08/27/1933 DOA: 07/19/2022 PCP: Cassandria Anger, MD  Chief Complaint  Patient presents with   Chest Pain   Wheezing    Brief Narrative:   Tracy Peters is a 89 y.o. female past medical history of DVT on Coumadin, peripheral vascular disease & hypertension who presented to the ED on 2/25 for shortness of breath x 2 weeks, progressively worsening as well as 2 days of chest pain at rest.  The emergency room, patient found to have new onset heart failure with a BNP of 1500 and chest x-ray noting pulmonary edema.  Patient admitted for treatment of new onset acute congestive heart failure.  Cardiology consulted.  Workup revealed severe aortic stenosis.  Patient underwent left and right heart catheterization revealing mild pulmonary hypertension and confirming severe calcific aortic stenosis.  CT surgery consulted and are recommending TAVR.  Unfortunately, this cannot be done until 3/12.  Initial plan had been to discharge patient on 3/2 after monitoring.  On evening of 3/1, patient started having episodes of what appear to be orthostatic hypotension with drops in blood pressure (systolic in the Q000111Q) and elevated heart rates increased to 15-20 beats with sitting or standing upright.  Patient did not pass out, but was near syncopal.  This was markedly different from earlier in the day when she was walking around with walker.  Orthostatic symptoms have continued to persist.  On 3/3, patient's hemoglobin down to 7.6, down from 9.6 one day previous.  Patient also with increasing creatinine.  Concerns for possi significant drop in hemoglobin plus AKI, concerns for bleed.  Transfuse 1 unit packed red blood cells and CT ordered.  Assessment & Plan:   Principal Problem:   Elevated troponin I level Active Problems:   Essential hypertension   NSTEMI (non-ST elevated myocardial infarction) (HCC)   Elevated d-dimer   Elevated troponin    Severe aortic stenosis   Syncope and collapse  Suspected bleed Possibly retroperitoneal.  Several days ago, patient complaining of left pelvic pain after near fall.  Initially she had attributed this to being pulled up by weight belt from physical therapy, however pain has not improved at all.  And given now AKI and drop in hemoglobin, checking CT scan.  Transfusing 1 unit packed red blood cells.  Eliquis on hold.  Orthostatic hypotension See above, felt to be secondary to bleed  Shortness of Breath HFpEF  secondary to severe aortic Stenosis Appreciate cardiology help.  IV Lasix changed to p.o.  Status post left and right heart catheterization.  Due to surgery recommending TAVR, which cannot be done until 3/12.   Chest Pain  NSTEMI  Elevated Troponin Appreciate cardiology recommendations.  Elevated troponins initially thought to be demand ischemia in the setting of heart failure and aortic stenosis, but wall motion abnormalities on 2D echo concerning for underlying CAD.  Status post left and right heart catheterization with no significant coronary lesions noted.  Continue heparin infusion plus aspirin.  Continue Crestor, LDL at 104 with target below 70.  Continue antihypertensives   Essential hypertension Metoprolol, losartan  Left hip pain Appears to be below left lower quadrant of abdomen, above the groin.  Tender to deep palpation.  As above.  Pain going on for several days.  Could potentially be from retroperitoneal bleed  Hx DVT Previously was on Coumadin and then Coumadin on hold and changed over to heparin.  Cardiology has since changed to Eliquis for better management  Abnormal CXR No infectious symptoms, monitor - suspect related to HF above Repeat as indicated   DVT prophylaxis: Eliquis on hold, starting SCDs Code Status: full Family Communication: Husband at bedside Disposition:   Status is: Inpatient Remains inpatient appropriate because:  -Stabilization and  etiology of anemia/orthostatic hypotension   Consultants:  Cardiology Cardiothoracic surgery  Procedures:  Status post left and right heart catheterization  Echo IMPRESSIONS     1. Left ventricular ejection fraction, by estimation, is 55 to 60%. The  left ventricle has normal function. The left ventricle has no regional  wall motion abnormalities. Left ventricular diastolic function could not  be evaluated. There is severe  hypokinesis of the left ventricular, basal-mid inferoseptal wall and  inferior wall.   2. Right ventricular systolic function is normal. The right ventricular  size is normal. There is normal pulmonary artery systolic pressure. The  estimated right ventricular systolic pressure is Q000111Q mmHg.   3. The mitral valve is degenerative. Mild mitral valve regurgitation. No  evidence of mitral stenosis. Moderate mitral annular calcification.   4. The aortic valve is calcified. There is severe calcifcation of the  aortic valve. There is severe thickening of the aortic valve. Aortic valve  regurgitation is not visualized. Severe aortic valve stenosis. Aortic  valve area, by VTI measures 0.46 cm.  Aortic valve mean gradient measures 18.0 mmHg. Aortic valve Vmax measures  3.02 m/s. Although the mean AVG is mild Vmax is not in the severe range,  the SVI is low at 15 and DVI 0.18. These findings are consistent with  paradoxical low flow low gradient  severe AS.   5. The inferior vena cava is normal in size with greater than 50%  respiratory variability, suggesting right atrial pressure of 3 mmHg.   Antimicrobials:  Anti-infectives (From admission, onward)    Start     Dose/Rate Route Frequency Ordered Stop   08/06/2022 1145  cefTRIAXone (ROCEPHIN) 1 g in sodium chloride 0.9 % 100 mL IVPB        1 g 200 mL/hr over 30 Minutes Intravenous  Once 08/09/2022 1132 07/19/2022 1313   07/30/2022 1145  doxycycline (VIBRA-TABS) tablet 100 mg        100 mg Oral  Once 07/26/2022 1132  07/20/2022 1236       Subjective: Feels weak, continues to have pain in left lower abdominal quadrant down to left pelvis  Objective: Vitals:   08/17/22 1040 08/17/22 1055 08/17/22 1125 08/17/22 1155  BP: (!) 146/64 (!) 156/69 (!) 162/72 (!) 177/87  Pulse: 94 92 97 94  Resp: 18 16    Temp: (!) 97 F (36.1 C) (!) 97.2 F (36.2 C) (!) 97.3 F (36.3 C) (!) 96.9 F (36.1 C)  TempSrc: Oral Axillary    SpO2: 99% 93% 96% 94%  Weight:      Height:        Intake/Output Summary (Last 24 hours) at 08/17/2022 1348 Last data filed at 08/17/2022 0800 Gross per 24 hour  Intake 1548.22 ml  Output 600 ml  Net 948.22 ml    Filed Weights   08/14/22 0417 08/15/22 0434 08/16/22 0433  Weight: 54.6 kg 54.9 kg 52.7 kg    Examination:  General: Alert and oriented x 3, no acute distress.  Lightheaded with some diaphoresis when sitting upright Cardiovascular: Regular rate and rhythm, S1-S2, 3 out of 6 systolic ejection murmur, mildly tachycardic when sitting upright Lungs: Poor inspiratory effort, otherwise clear to auscultation bilaterally Abdomen: Soft, tenderness right  below her left lower quadrant, above the groin (appears to be more musculoskeletal), nondistended, positive bowel sounds Neurological: No focal deficits Extremities: No clubbing or cyanosis.  Trace pitting edema.   Data Reviewed: I have personally reviewed following labs and imaging studies  CBC: Recent Labs  Lab 07/18/2022 0213 08/09/2022 1410 08/14/22 0203 08/15/22 0431 08/16/22 0325 08/17/22 0311 08/17/22 0612  WBC 8.2   < > 7.8 11.3* 10.2 18.0* 19.0*  NEUTROABS 4.8  --   --   --   --   --  15.4*  HGB 10.9*   < > 11.1* 10.0* 9.6* 8.1* 7.6*  HCT 32.7*   < > 33.0* 30.1* 27.9* 23.9* 22.0*  MCV 100.3*   < > 99.1 100.7* 98.6 99.6 99.1  PLT 264   < > 271 257 264 300 290   < > = values in this interval not displayed.     Basic Metabolic Panel: Recent Labs  Lab 07/17/2022 0213 08/07/2022 1410 08/13/22 0732 08/14/22 0203  08/16/22 0325 08/17/22 0311 08/17/22 0612  NA 136   < > 138 135 131* 128* 128*  K 3.6   < > 3.8 3.9 3.2* 5.3* 4.6  CL 99  --  103 99 101 98 97*  CO2 26  --  '23 24 23 '$ 21* 20*  GLUCOSE 107*  --  106* 134* 101* 154* 139*  BUN 20  --  23 30* 21 27* 27*  CREATININE 1.23*  --  0.89 0.96 0.93 1.34* 1.44*  CALCIUM 8.7*  --  8.8* 8.5* 8.0* 8.2* 8.3*  MG 1.7  --   --   --   --   --   --   PHOS 4.1  --   --   --   --   --   --    < > = values in this interval not displayed.     GFR: Estimated Creatinine Clearance: 23.3 mL/min (A) (by C-G formula based on SCr of 1.44 mg/dL (H)).  Liver Function Tests: Recent Labs  Lab 07/27/2022 0213  AST 18  ALT 11  ALKPHOS 39  BILITOT 0.5  PROT 6.0*  ALBUMIN 3.0*     CBG: Recent Labs  Lab 07/22/2022 2146  GLUCAP 122*      Recent Results (from the past 240 hour(s))  Resp panel by RT-PCR (RSV, Flu A&B, Covid) Anterior Nasal Swab     Status: None   Collection Time: 08/11/2022  9:12 AM   Specimen: Anterior Nasal Swab  Result Value Ref Range Status   SARS Coronavirus 2 by RT PCR NEGATIVE NEGATIVE Final    Comment: (NOTE) SARS-CoV-2 target nucleic acids are NOT DETECTED.  The SARS-CoV-2 RNA is generally detectable in upper respiratory specimens during the acute phase of infection. The lowest concentration of SARS-CoV-2 viral copies this assay can detect is 138 copies/mL. A negative result does not preclude SARS-Cov-2 infection and should not be used as the sole basis for treatment or other patient management decisions. A negative result may occur with  improper specimen collection/handling, submission of specimen other than nasopharyngeal swab, presence of viral mutation(s) within the areas targeted by this assay, and inadequate number of viral copies(<138 copies/mL). A negative result must be combined with clinical observations, patient history, and epidemiological information. The expected result is Negative.  Fact Sheet for Patients:   EntrepreneurPulse.com.au  Fact Sheet for Healthcare Providers:  IncredibleEmployment.be  This test is no t yet approved or cleared by the [place] and  has been authorized for detection and/or diagnosis of SARS-CoV-2 by FDA under an Emergency Use Authorization (EUA). This EUA will remain  in effect (meaning this test can be used) for the duration of the COVID-19 declaration under Section 564(b)(1) of the Act, 21 U.S.C.section 360bbb-3(b)(1), unless the authorization is terminated  or revoked sooner.       Influenza A by PCR NEGATIVE NEGATIVE Final   Influenza B by PCR NEGATIVE NEGATIVE Final    Comment: (NOTE) The Xpert Xpress SARS-CoV-2/FLU/RSV plus assay is intended as an aid in the diagnosis of influenza from Nasopharyngeal swab specimens and should not be used as a sole basis for treatment. Nasal washings and aspirates are unacceptable for Xpert Xpress SARS-CoV-2/FLU/RSV testing.  Fact Sheet for Patients: EntrepreneurPulse.com.au  Fact Sheet for Healthcare Providers: IncredibleEmployment.be  This test is not yet approved or cleared by the [place] FDA and has been authorized for detection and/or diagnosis of SARS-CoV-2 by FDA under an Emergency Use Authorization (EUA). This EUA will remain in effect (meaning this test can be used) for the duration of the COVID-19 declaration under Section 564(b)(1) of the Act, 21 U.S.C. section 360bbb-3(b)(1), unless the authorization is terminated or revoked.     Resp Syncytial Virus by PCR NEGATIVE NEGATIVE Final    Comment: (NOTE) Fact Sheet for Patients: EntrepreneurPulse.com.au  Fact Sheet for Healthcare Providers: IncredibleEmployment.be  This test is not yet approved or cleared by the [place] FDA and has been authorized for detection and/or diagnosis of SARS-CoV-2 by FDA under an Emergency Use  Authorization (EUA). This EUA will remain in effect (meaning this test can be used) for the duration of the COVID-19 declaration under Section 564(b)(1) of the Act, 21 U.S.C. section 360bbb-3(b)(1), unless the authorization is terminated or revoked.  Performed at [facility], [address], [place], [place] [address]   MRSA Next Gen by PCR, Nasal     Status: None   Collection Time: 08/11/22 11:57 PM   Specimen: Nasal Mucosa; Nasal Swab  Result Value Ref Range Status   MRSA by PCR Next Gen NOT DETECTED NOT DETECTED Final    Comment: (NOTE) The GeneXpert MRSA Assay (FDA approved for NASAL specimens only), is one component of a comprehensive MRSA colonization surveillance program. It is not intended to diagnose MRSA infection nor to guide or monitor treatment for MRSA infections. Test performance is not FDA approved in patients less than 62 years old. Performed at [facility], [place] [address], [place], [place] [address]          Radiology Studies: No results found.      Scheduled Meds:  apixaban  2.5 mg Oral BID   aspirin EC  81 mg Oral Daily   diphenhydrAMINE  50 mg Oral Once   Or   diphenhydrAMINE  50 mg Intravenous Once   famotidine  20 mg Oral Daily   lacosamide  50 mg Oral BID   methylPREDNISolone (SOLU-MEDROL) injection  40 mg Intravenous Once   metoprolol succinate  25 mg Oral Daily   rosuvastatin  20 mg Oral Daily   sodium chloride flush  3 mL Intravenous Q12H   Continuous Infusions:  sodium chloride       LOS: 7 days      Annita Brod, MD [facility]   To contact the attending provider between 7A-7P or the covering provider during after hours 7P-7A, please log into the web site [email or web address] and access using universal [place] password for that web  site. If you do not have the password, please call the hospital operator.  08/17/2022, 1:48 PM

## 2022-08-18 DIAGNOSIS — D62 Acute posthemorrhagic anemia: Secondary | ICD-10-CM | POA: Diagnosis not present

## 2022-08-18 DIAGNOSIS — R58 Hemorrhage, not elsewhere classified: Secondary | ICD-10-CM | POA: Diagnosis not present

## 2022-08-18 DIAGNOSIS — I110 Hypertensive heart disease with heart failure: Secondary | ICD-10-CM | POA: Diagnosis not present

## 2022-08-18 DIAGNOSIS — R7989 Other specified abnormal findings of blood chemistry: Secondary | ICD-10-CM | POA: Diagnosis not present

## 2022-08-18 DIAGNOSIS — I21A1 Myocardial infarction type 2: Secondary | ICD-10-CM | POA: Diagnosis not present

## 2022-08-18 DIAGNOSIS — I5033 Acute on chronic diastolic (congestive) heart failure: Secondary | ICD-10-CM | POA: Diagnosis not present

## 2022-08-18 DIAGNOSIS — I350 Nonrheumatic aortic (valve) stenosis: Secondary | ICD-10-CM | POA: Diagnosis not present

## 2022-08-18 DIAGNOSIS — Z1152 Encounter for screening for COVID-19: Secondary | ICD-10-CM | POA: Diagnosis not present

## 2022-08-18 LAB — BASIC METABOLIC PANEL
Anion gap: 12 (ref 5–15)
Anion gap: 11 (ref 5–15)
BUN: 33 mg/dL — ABNORMAL HIGH (ref 8–23)
BUN: 32 mg/dL — ABNORMAL HIGH (ref 8–23)
CO2: 19 mmol/L — ABNORMAL LOW (ref 22–32)
CO2: 21 mmol/L — ABNORMAL LOW (ref 22–32)
Calcium: 8.3 mg/dL — ABNORMAL LOW (ref 8.9–10.3)
Calcium: 8.5 mg/dL — ABNORMAL LOW (ref 8.9–10.3)
Chloride: 94 mmol/L — ABNORMAL LOW (ref 98–111)
Chloride: 92 mmol/L — ABNORMAL LOW (ref 98–111)
Creatinine, Ser: 1.54 mg/dL — ABNORMAL HIGH (ref 0.44–1.00)
Creatinine, Ser: 1.61 mg/dL — ABNORMAL HIGH (ref 0.44–1.00)
GFR, Estimated: 33 mL/min — ABNORMAL LOW (ref >60)
GFR, Estimated: 31 mL/min — ABNORMAL LOW (ref >60)
Glucose, Bld: 139 mg/dL — ABNORMAL HIGH (ref 70–99)
Glucose, Bld: 157 mg/dL — ABNORMAL HIGH (ref 70–99)
Potassium: 4.7 mmol/L (ref 3.5–5.1)
Potassium: 4.6 mmol/L (ref 3.5–5.1)
Sodium: 125 mmol/L — ABNORMAL LOW (ref 135–145)
Sodium: 124 mmol/L — ABNORMAL LOW (ref 135–145)

## 2022-08-18 LAB — CBC
HCT: 27.2 % — ABNORMAL LOW (ref 36.0–46.0)
HCT: 25.8 % — ABNORMAL LOW (ref 36.0–46.0)
Hemoglobin: 9.4 g/dL — ABNORMAL LOW (ref 12.0–15.0)
Hemoglobin: 9.4 g/dL — ABNORMAL LOW (ref 12.0–15.0)
MCH: 31.5 pg (ref 26.0–34.0)
MCH: 32.8 pg (ref 26.0–34.0)
MCHC: 34.6 g/dL (ref 30.0–36.0)
MCHC: 36.4 g/dL — ABNORMAL HIGH (ref 30.0–36.0)
MCV: 91.3 fL (ref 80.0–100.0)
MCV: 89.9 fL (ref 80.0–100.0)
Platelets: 271 10*3/uL (ref 150–400)
Platelets: 268 10*3/uL (ref 150–400)
RBC: 2.98 MIL/uL — ABNORMAL LOW (ref 3.87–5.11)
RBC: 2.87 MIL/uL — ABNORMAL LOW (ref 3.87–5.11)
RDW: 17.9 % — ABNORMAL HIGH (ref 11.5–15.5)
RDW: 17.8 % — ABNORMAL HIGH (ref 11.5–15.5)
WBC: 22.1 10*3/uL — ABNORMAL HIGH (ref 4.0–10.5)
WBC: 25.4 10*3/uL — ABNORMAL HIGH (ref 4.0–10.5)
nRBC: 0.2 % (ref 0.0–0.2)
nRBC: 0.2 % (ref 0.0–0.2)

## 2022-08-18 LAB — BPAM RBC
Blood Product Expiration Date: 202403252359
ISSUE DATE / TIME: 202403031030
Unit Type and Rh: 6200

## 2022-08-18 LAB — TYPE AND SCREEN
ABO/RH(D): AB POS
Antibody Screen: NEGATIVE
Unit division: 0

## 2022-08-18 LAB — TROPONIN I (HIGH SENSITIVITY): Troponin I (High Sensitivity): 54 ng/L — ABNORMAL HIGH (ref <18)

## 2022-08-18 LAB — PROCALCITONIN: Procalcitonin: 0.3 ng/mL

## 2022-08-18 LAB — BRAIN NATRIURETIC PEPTIDE: B Natriuretic Peptide: 2123.8 pg/mL — ABNORMAL HIGH (ref 0.0–100.0)

## 2022-08-18 MED ORDER — PREDNISONE 20 MG PO TABS: 50.0000 mg | ORAL_TABLET | Freq: Once | ORAL | Status: DC

## 2022-08-18 MED ORDER — FUROSEMIDE 10 MG/ML IJ SOLN: 40.0000 mg | Freq: Two times a day (BID) | INTRAMUSCULAR | Status: DC

## 2022-08-18 MED ORDER — DIPHENHYDRAMINE HCL 25 MG PO CAPS: 50.0000 mg | ORAL_CAPSULE | Freq: Once | ORAL | Status: DC

## 2022-08-18 MED ORDER — PHENOL 1.4 % MT LIQD: 1.0000 | OROMUCOSAL | Status: DC | PRN

## 2022-08-18 MED ORDER — NITROGLYCERIN 0.4 MG SL SUBL
SUBLINGUAL_TABLET | SUBLINGUAL | Status: AC
  Filled 2022-08-18: qty 3

## 2022-08-18 MED ORDER — NITROGLYCERIN 0.4 MG SL SUBL: 0.4000 mg | SUBLINGUAL_TABLET | SUBLINGUAL | Status: DC | PRN

## 2022-08-18 MED ORDER — PREDNISONE 20 MG PO TABS
50.0000 mg | ORAL_TABLET | Freq: Once | ORAL | Status: AC
  Administered 2022-08-18: 50 mg via ORAL
  Filled 2022-08-18: qty 1

## 2022-08-18 MED ORDER — PREDNISONE 20 MG PO TABS
50.0000 mg | ORAL_TABLET | Freq: Once | ORAL | Status: AC
  Administered 2022-08-19: 50 mg via ORAL
  Filled 2022-08-18: qty 1

## 2022-08-18 NOTE — Progress Notes (Addendum)
Rounding Note    Patient Name: Tracy Peters Date of Encounter: 08/18/2022  [place] Cardiologist: Werner Lean, MD   Subjective   Patient appears quite fatigued and sick in bed. She reports significant upper abdominal/epigastric pain. Continues with dizziness today, reports room spinning sensation. She has not felt well enough to get out of bed today so unknown if she's still orthostatic.   Inpatient Medications    Scheduled Meds:  famotidine  20 mg Oral Daily   lacosamide  50 mg Oral BID   methylPREDNISolone (SOLU-MEDROL) injection  40 mg Intravenous Once   metoprolol succinate  25 mg Oral Daily   rosuvastatin  20 mg Oral Daily   sodium chloride flush  3 mL Intravenous Q12H   Continuous Infusions:  sodium chloride     PRN Meds: sodium chloride, acetaminophen, cyclobenzaprine, morphine injection, ondansetron (ZOFRAN) IV, sodium chloride flush, traMADol, zolpidem   Vital Signs    Vitals:   08/18/22 0446 08/18/22 0449 08/18/22 0806 08/18/22 0921  BP: (!) 160/76  (!) 158/70 (!) 156/73  Pulse: 98  (!) 101 99  Resp: 19  20   Temp: 98.1 F (36.7 C)  97.7 F (36.5 C)   TempSrc: Oral  Axillary   SpO2: 100%  98%   Weight:  56.5 kg    Height:        Intake/Output Summary (Last 24 hours) at 08/18/2022 0932 Last data filed at 08/18/2022 0449 Gross per 24 hour  Intake 412.5 ml  Output 650 ml  Net -237.5 ml      08/18/2022    4:49 AM 08/17/2022   12:14 PM 08/16/2022    4:33 AM  Last 3 Weights  Weight (lbs) 124 lb 9 oz 119 lb 0.8 oz 116 lb 2.9 oz  Weight (kg) 56.5 kg 54 kg 52.7 kg      Telemetry    Sinus rhythm, sinus tachycardia - Personally Reviewed  ECG    No new tracing - Personally Reviewed  Physical Exam   GEN: frail, appears to be in pain. HEENT: conjunctival pallor Neck: No JVD Cardiac: RRR, loud systolic murmur heard in all positions. Respiratory: Clear to auscultation bilaterally. GI: diffuse abdominal tenderness MS: No  edema; No deformity. Neuro:  Nonfocal  Psych: Normal affect   Labs    High Sensitivity Troponin:   Recent Labs  Lab 07/30/2022 0900 07/25/2022 1056 08/09/2022 1616 08/09/2022 1743  TROPONINIHS 199* 189* 241* 250*     Chemistry Recent Labs  Lab 08/06/2022 0213 08/08/2022 1410 08/17/22 0311 08/17/22 0612 08/18/22 0231  NA 136   < > 128* 128* 125*  K 3.6   < > 5.3* 4.6 4.7  CL 99   < > 98 97* 94*  CO2 26   < > 21* 20* 19*  GLUCOSE 107*   < > 154* 139* 139*  BUN 20   < > 27* 27* 33*  CREATININE 1.23*   < > 1.34* 1.44* 1.54*  CALCIUM 8.7*   < > 8.2* 8.3* 8.3*  MG 1.7  --   --   --   --   PROT 6.0*  --   --   --   --   ALBUMIN 3.0*  --   --   --   --   AST 18  --   --   --   --   ALT 11  --   --   --   --   ALKPHOS 39  --   --   --   --  BILITOT 0.5  --   --   --   --   GFRNONAA 43*   < > 39* 35* 33*  ANIONGAP 11   < > '9 11 12   '$ < > = values in this interval not displayed.    Lipids No results for input(s): "CHOL", "TRIG", "HDL", "LABVLDL", "LDLCALC", "CHOLHDL" in the last 168 hours.  Hematology Recent Labs  Lab 08/17/22 0311 08/17/22 0612 08/17/22 1615 08/18/22 0231  WBC 18.0* 19.0*  --  22.1*  RBC 2.40* 2.22*  --  2.98*  HGB 8.1* 7.6* 10.6* 9.4*  HCT 23.9* 22.0* 29.9* 27.2*  MCV 99.6 99.1  --  91.3  MCH 33.8 34.2*  --  31.5  MCHC 33.9 34.5  --  34.6  RDW 12.9 13.0  --  17.9*  PLT 300 290  --  271   Thyroid No results for input(s): "TSH", "FREET4" in the last 168 hours.  BNP Recent Labs  Lab 08/17/22 0927 08/18/22 0231  BNP 1,127.1* 2,123.8*    DDimer No results for input(s): "DDIMER" in the last 168 hours.   Radiology    CT ABDOMEN PELVIS WO CONTRAST  Result Date: 08/17/2022 CLINICAL DATA:  Evaluate for retroperitoneal hematoma. Status post cardiac catheterization on 07/20/2022. EXAM: CT ABDOMEN AND PELVIS WITHOUT CONTRAST TECHNIQUE: Multidetector CT imaging of the abdomen and pelvis was performed following the standard protocol without IV contrast. RADIATION  DOSE REDUCTION: This exam was performed according to the departmental dose-optimization program which includes automated exposure control, adjustment of the mA and/or kV according to patient size and/or use of iterative reconstruction technique. COMPARISON:  04/07/2011 FINDINGS: Lower chest: Calcifications of the aortic and mitral valve. Heart size normal. No pericardial effusion. Small bilateral pleural effusions with overlying compressive type atelectasis. Hepatobiliary: No focal liver abnormality. Status post cholecystectomy. Increase caliber of the CBD measures up to 1.1 cm. Pancreas: Unremarkable. No pancreatic ductal dilatation or surrounding inflammatory changes. Spleen: Normal in size without focal abnormality. Adrenals/Urinary Tract: Normal adrenal glands. Asymmetric left renal atrophy. Left hydronephrosis and hydroureter identified. I suspect this reflects mass effect from left pelvic hematoma. Right hydronephrosis and hydroureter is noted. No obstructing stone identified. Left pelvic hematoma has mass effect upon the bladder which is displaced towards the right pelvic sidewall. Stomach/Bowel: Stomach appears normal. No bowel wall thickening, inflammation, or distension. Vascular/Lymphatic: Aortic atherosclerosis. No aneurysm. No abdominopelvic adenopathy. Reproductive: The uterus is either atrophic or surgically absent. No suspicious adnexal mass. Other: Small volume of hemoperitoneum is identified with blood extending along bilateral paracolic gutters. There is a large hyperdense mass within the left hemipelvis which extends across the midline compatible with a hematoma. This measures 12.8 x 10.2 by 10.5 cm (volume = 718 cm^3). Diffuse body wall edema is identified compatible with anasarca. Musculoskeletal: No acute or suspicious osseous findings. Right hip arthroplasty. Lumbar degenerative disc disease. IMPRESSION: 1. Large left pelvic hematoma is identified with mass effect upon the bladder. This has  an approximate volume of 718 cc. 2. Small volume of hemoperitoneum is identified with blood extending along bilateral paracolic gutters. 3. Bilateral hydronephrosis and hydroureter. I suspect this reflects mass effect from left pelvic hematoma. 4. Small bilateral pleural effusions with overlying compressive type atelectasis. 5. Diffuse body wall edema compatible with anasarca. 6. Increase caliber of the CBD measures up to 1.1 cm status post cholecystectomy. 7.  Aortic Atherosclerosis (ICD10-I70.0). Electronically Signed   By: Kerby Moors M.D.   On: 08/17/2022 15:20    Cardiac Studies  Echocardiogram 08/11/22  1. Left ventricular ejection fraction, by estimation, is 55 to 60%. The  left ventricle has normal function. The left ventricle has no regional  wall motion abnormalities. Left ventricular diastolic function could not  be evaluated. There is severe  hypokinesis of the left ventricular, basal-mid inferoseptal wall and  inferior wall.   2. Right ventricular systolic function is normal. The right ventricular  size is normal. There is normal pulmonary artery systolic pressure. The  estimated right ventricular systolic pressure is [id number] mmHg.   3. The mitral valve is degenerative. Mild mitral valve regurgitation. No  evidence of mitral stenosis. Moderate mitral annular calcification.   4. The aortic valve is calcified. There is severe calcifcation of the  aortic valve. There is severe thickening of the aortic valve. Aortic valve  regurgitation is not visualized. Severe aortic valve stenosis. Aortic  valve area, by VTI measures 0.46 cm.  Aortic valve mean gradient measures 18.0 mmHg. Aortic valve Vmax measures  3.02 m/s. Although the mean AVG is mild Vmax is not in the severe range,  the SVI is low at 15 and DVI 0.18. These findings are consistent with  paradoxical low flow low gradient  severe AS.   5. The inferior vena cava is normal in size with greater than 50%  respiratory  variability, suggesting right atrial pressure of 3 mmHg.    Right/Left Heart Catheterization 07/27/2022 1.  Mild aorto ostial disease involving the ostial RCA and the ostial left main with less than 50% stenosis at both lesion sites 2.  Patent LAD, left circumflex, and RCA without any high-grade stenoses 3.  Mild pulmonary hypertension with mean PA pressure 26 mmHg, mildly increased wedge pressure of 19 mmHg, and otherwise normal right heart hemodynamics.  Preserved cardiac output and index of 4.6 L/min and 2.99 L/min/m 4.  Known severe calcific aortic stenosis with heavy calcification and restriction of the aortic valve leaflets seen on plain fluoroscopy   Recommendations: Resume heparin in 8 hours.  Continue TAVR evaluation with CT angiogram studies and formal cardiac surgical consultation as previously outlined  Patient Profile     89 y.o. female with a past medical history of HTN, HLD, PAD, prior DVT, mild carotid disease. Patient presented with worsening chest pain and shortness of breath. Found to have acute heart failure exacerbation in the setting of severe AS. Ongoing TAVR workup.  Assessment & Plan    Syncope  Patient with multiple episodes of syncope in the last few days. Suspected to be more orthostatic rather than AS driven as patient previously just had mild baseline dizziness. Orthostatic vital signs have been notably positive.   Ongoing dizziness at rest today. Suspect that this may now be anemia driven with large left pelvic hematoma.  Permissive tachycardia/hypertension with notable orthostatic vital signs.   Severe Aortic Stenosis Acute on chronic HFpEF  TTE this admission with LVEF 55-60% with severe calcification of the aortic valve with severe stenosis. Mean gradient 18.0 mmHg. Vmax 3.02 m/s. Heart catheterization confirmed severe calcific aortic stenosis with restriction of leaflets.   Patient tentatively planned for TAVR on 3/12 following structural team evaluation.   Given syncope/orthostasis as above, would permit slightly higher BP. For now, continue holding losartan, amlodipine, clonidine.  With ongoing dizziness and elevated creatinine, will hold IV lasix. If patient requires transfusion for anemia, would carefully dose lasix to avoid volume overload.   NSTEMI  Troponin elevated this admission: 199->189->241->250. LHC/RHC with mild aorto ostial disease involving the ostial RCA and  the ostial left main with less than 50% stenosis at both lesion sites. LAD, left circumflex, and RCA without high grade stenoses.   Continue Metoprolol Hold ASA with acute anemia, large pelvic hematoma  SVT  Patient with SVT following apparent syncopal episode on 2/28. Maintaining sinus/sinus tachy on monitor. No recurrence of rapid HR.   History of DVT  Patient with history of DVT was taking coumadin prior to this admission. Was switched to Eliquis in order to facilitate TAVR. Now with acute pelvic hematoma.  Continue holding Eliquis  Pelvic hematoma  Patient noted with decreasing hemoglobin this admission. Found with large left pelvic hematoma with mass effect on bladder. Eliquis held.   Patient s/p transfusion yesterday with hbg<8. At this time, hematoma appears to be patient's primary issue. Today patient with notable conjunctival pallor and weakness on exam. Will order stat H&H, concern for ongoing bleed and worsening anemia.      For questions or updates, please contact [place] Please consult [email or web address] for contact info under        Signed, Lily Kocher, PA-C  08/18/2022, 9:32 AM     Personally seen and examined. Agree with APP above with the following comments:  Briefly 89 yo F with severe AS bilateral pleural effusions with new AS She was a R radial access.  On Wednesday she had a fall with PT.  She has a new large RP bleed. She has had orthostatis that has limited diuretics (she still have bilateral pleural effusions    Patient  is weak and feels poorly She has new pallor from admission. She has a harsh systolic murmur  Tele sinus tachycardia  Would recommend  - she will eventually need return of diuresis when she is HD stable - State HGB Pending; goal < 8 transusion - discussed with primary who is reaching out for IR eval - given risks and benefits, reasonable to proceed for IR intervention despite severe AS given change in clinical syndrome  Rudean Haskell, MD [place]  [address] [place], [place] [address] [phone]  11:21 AM

## 2022-08-18 NOTE — Plan of Care (Signed)
  Problem: Education: Goal: Knowledge of General Education information will improve Description: Including pain rating scale, medication(s)/side effects and non-pharmacologic comfort measures Outcome: Progressing   Problem: Health Behavior/Discharge Planning: Goal: Ability to manage health-related needs will improve Outcome: Progressing   Problem: Clinical Measurements: Goal: Cardiovascular complication will be avoided Outcome: Progressing   Problem: Activity: Goal: Risk for activity intolerance will decrease Outcome: Progressing   Problem: Nutrition: Goal: Adequate nutrition will be maintained Outcome: Progressing   Problem: Pain Managment: Goal: General experience of comfort will improve Outcome: Progressing   Problem: Safety: Goal: Ability to remain free from injury will improve Outcome: Progressing   Problem: Cardiovascular: Goal: Ability to achieve and maintain adequate cardiovascular perfusion will improve Outcome: Progressing Goal: Vascular access site(s) Level 0-1 will be maintained Outcome: Progressing

## 2022-08-18 NOTE — Progress Notes (Addendum)
PROGRESS NOTE    Tracy Peters  [id number] DOB: 12-01-1933 DOA: 07/29/2022 PCP: Cassandria Anger, MD  Chief Complaint  Patient presents with   Chest Pain   Wheezing    Brief Narrative:   Tracy Peters is a 89 y.o. female past medical history of DVT on Coumadin, peripheral vascular disease & hypertension who presented to the ED on 2/25 for shortness of breath x 2 weeks, progressively worsening as well as 2 days of chest pain at rest.  The emergency room, patient found to have new onset heart failure with a BNP of 1500 and chest x-ray noting pulmonary edema.  Patient admitted for treatment of new onset acute congestive heart failure.  Cardiology consulted.  Workup revealed severe aortic stenosis.  Patient underwent left and right heart catheterization revealing mild pulmonary hypertension and confirming severe calcific aortic stenosis.  CT surgery consulted and are recommending TAVR.  Unfortunately, this cannot be done until 3/12.  Initial plan had been to discharge patient on 3/2 after monitoring.  On evening of 3/1, patient started having episodes of orthostatic hypotension causing near syncope.  On 3/3, patient's hemoglobin down to 7.6, down from 9.6 one day previous.  Patient also with increasing creatinine.  Patient had also been complaining of some left sided pelvic pain and CT scan done 3/3 revealed large pelvic hematoma.  Aspirin and Eliquis put on hold.  Assessment & Plan:   Principal Problem:   Elevated troponin I level Active Problems:   Essential hypertension   NSTEMI (non-ST elevated myocardial infarction) (HCC)   Elevated d-dimer   Elevated troponin   Severe aortic stenosis   Syncope and collapse  Pelvic bleed causing acute blood loss anemia Eliquis on aspirin on hold.  Transfuse 1 unit packed red blood cells.  Although hemoglobin itself has not changed, creatinine continues to trend upward as does white blood cell count (likely from stress  margination from bleeding) and patient has become more tachycardic.  Discussed with cardiology and IR consulted for CT angiogram and possible embolization.  Orthostatic hypotension See above, felt to be secondary to bleed   Shortness of Breath HFpEF  secondary to severe aortic Stenosis Appreciate cardiology help.  IV Lasix changed to p.o.  Status post left and right heart catheterization.  Due to surgery recommending TAVR, which cannot be done until 3/12.  Now back in heart failure secondary to IV fluids and then blood transfusion with minimal amount of Lasix able to be given due to ongoing orthostasis and blood loss.  Chest Pain  NSTEMI  Elevated Troponin Appreciate cardiology recommendations.  Elevated troponins initially thought to be demand ischemia in the setting of heart failure and aortic stenosis, but wall motion abnormalities on 2D echo concerning for underlying CAD.  Status post left and right heart catheterization with no significant coronary lesions noted.  Continue heparin infusion plus aspirin.  Continue Crestor, LDL at 104 with target below 70.  Continue antihypertensives  Hyponatremia Secondary to acute heart failure from fluids and blood transfused.  Unable to diurese at this time until bleeding/hypotension has resolved.   Essential hypertension Metoprolol, losartan  Left hip pain Appears to be below left lower quadrant of abdomen, above the groin.  Tender to deep palpation.  As above.  Pain going on for several days.  Could potentially be from retroperitoneal bleed  Hx DVT Previously was on Coumadin and then Coumadin on hold and changed over to heparin.  Cardiology has since changed to Eliquis for better management  Abnormal CXR No infectious symptoms, monitor - suspect related to HF above Repeat as indicated   DVT prophylaxis: Eliquis on hold, starting SCDs Code Status: full Family Communication: Husband at bedside Disposition:   Status is: Inpatient Remains  inpatient appropriate because:  -Stabilization of bleed   Consultants:  Cardiology Cardiothoracic surgery Interventional radiology  Procedures:  Status post left and right heart catheterization Planned CT angiogram with possible embolization intervention Echocardiogram with severe aortic stenosis  Antimicrobials:  Anti-infectives (From admission, onward)    Start     Dose/Rate Route Frequency Ordered Stop   08/09/2022 1145  cefTRIAXone (ROCEPHIN) 1 g in sodium chloride 0.9 % 100 mL IVPB        1 g 200 mL/hr over 30 Minutes Intravenous  Once 07/31/2022 1132 07/23/2022 1313   08/11/2022 1145  doxycycline (VIBRA-TABS) tablet 100 mg        100 mg Oral  Once 07/27/2022 1132 08/04/2022 1236       Subjective: Patient feels very weak, somnolent  Objective: Vitals:   08/18/22 0449 08/18/22 0806 08/18/22 0921 08/18/22 1241  BP:  (!) 158/70 (!) 156/73 (!) 161/80  Pulse:  (!) 101 99 (!) 107  Resp:  20  16  Temp:  97.7 F (36.5 C)  97.9 F (36.6 C)  TempSrc:  Axillary  Axillary  SpO2:  98% 99% 99%  Weight: 56.5 kg     Height:        Intake/Output Summary (Last 24 hours) at 08/18/2022 1504 Last data filed at 08/18/2022 0449 Gross per 24 hour  Intake --  Output 650 ml  Net -650 ml    Filed Weights   08/16/22 0433 08/17/22 1214 08/18/22 0449  Weight: 52.7 kg 54 kg 56.5 kg    Examination:  General: somnolent, dizzy Cardiovascular: Increasing tachycardia, 3 out of 6 systolic ejection murmur Lungs: Poor inspiratory effort, otherwise clear to auscultation bilaterally Abdomen: Soft, tenderness right below her left lower quadrant, above the groin (appears to be more musculoskeletal), nondistended, positive bowel sounds Neurological: No focal deficits Extremities: No clubbing or cyanosis.  Trace pitting edema.   Data Reviewed: I have personally reviewed following labs and imaging studies  CBC: Recent Labs  Lab 08/11/2022 0213 08/03/2022 1410 08/16/22 0325 08/17/22 0311 08/17/22 0612  08/17/22 1615 08/18/22 0231 08/18/22 1114  WBC 8.2   < > 10.2 18.0* 19.0*  --  22.1* 25.4*  NEUTROABS 4.8  --   --   --  15.4*  --   --   --   HGB 10.9*   < > 9.6* 8.1* 7.6* 10.6* 9.4* 9.4*  HCT 32.7*   < > 27.9* 23.9* 22.0* 29.9* 27.2* 25.8*  MCV 100.3*   < > 98.6 99.6 99.1  --  91.3 89.9  PLT 264   < > 264 300 290  --  271 268   < > = values in this interval not displayed.     Basic Metabolic Panel: Recent Labs  Lab 07/28/2022 0213 07/30/2022 1410 08/16/22 0325 08/17/22 0311 08/17/22 0612 08/18/22 0231 08/18/22 1237  NA 136   < > 131* 128* 128* 125* 124*  K 3.6   < > 3.2* 5.3* 4.6 4.7 4.6  CL 99   < > 101 98 97* 94* 92*  CO2 26   < > 23 21* 20* 19* 21*  GLUCOSE 107*   < > 101* 154* 139* 139* 157*  BUN 20   < > 21 27* 27* 33* 32*  CREATININE  1.23*   < > 0.93 1.34* 1.44* 1.54* 1.61*  CALCIUM 8.7*   < > 8.0* 8.2* 8.3* 8.3* 8.5*  MG 1.7  --   --   --   --   --   --   PHOS 4.1  --   --   --   --   --   --    < > = values in this interval not displayed.     GFR: Estimated Creatinine Clearance: 21.7 mL/min (A) (by C-G formula based on SCr of 1.61 mg/dL (H)).  Liver Function Tests: Recent Labs  Lab 08/08/2022 0213  AST 18  ALT 11  ALKPHOS 39  BILITOT 0.5  PROT 6.0*  ALBUMIN 3.0*     CBG: No results for input(s): "GLUCAP" in the last 168 hours.    Recent Results (from the past 240 hour(s))  Resp panel by RT-PCR (RSV, Flu A&B, Covid) Anterior Nasal Swab     Status: None   Collection Time: 07/21/2022  9:12 AM   Specimen: Anterior Nasal Swab  Result Value Ref Range Status   SARS Coronavirus 2 by RT PCR NEGATIVE NEGATIVE Final    Comment: (NOTE) SARS-CoV-2 target nucleic acids are NOT DETECTED.  The SARS-CoV-2 RNA is generally detectable in upper respiratory specimens during the acute phase of infection. The lowest concentration of SARS-CoV-2 viral copies this assay can detect is 138 copies/mL. A negative result does not preclude SARS-Cov-2 infection and should  not be used as the sole basis for treatment or other patient management decisions. A negative result may occur with  improper specimen collection/handling, submission of specimen other than nasopharyngeal swab, presence of viral mutation(s) within the areas targeted by this assay, and inadequate number of viral copies(<138 copies/mL). A negative result must be combined with clinical observations, patient history, and epidemiological information. The expected result is Negative.  Fact Sheet for Patients:  EntrepreneurPulse.com.au  Fact Sheet for Healthcare Providers:  IncredibleEmployment.be  This test is no t yet approved or cleared by the [place] FDA and  has been authorized for detection and/or diagnosis of SARS-CoV-2 by FDA under an Emergency Use Authorization (EUA). This EUA will remain  in effect (meaning this test can be used) for the duration of the COVID-19 declaration under Section 564(b)(1) of the Act, 21 U.S.C.section 360bbb-3(b)(1), unless the authorization is terminated  or revoked sooner.       Influenza A by PCR NEGATIVE NEGATIVE Final   Influenza B by PCR NEGATIVE NEGATIVE Final    Comment: (NOTE) The Xpert Xpress SARS-CoV-2/FLU/RSV plus assay is intended as an aid in the diagnosis of influenza from Nasopharyngeal swab specimens and should not be used as a sole basis for treatment. Nasal washings and aspirates are unacceptable for Xpert Xpress SARS-CoV-2/FLU/RSV testing.  Fact Sheet for Patients: EntrepreneurPulse.com.au  Fact Sheet for Healthcare Providers: IncredibleEmployment.be  This test is not yet approved or cleared by the [place] FDA and has been authorized for detection and/or diagnosis of SARS-CoV-2 by FDA under an Emergency Use Authorization (EUA). This EUA will remain in effect (meaning this test can be used) for the duration of the COVID-19 declaration under  Section 564(b)(1) of the Act, 21 U.S.C. section 360bbb-3(b)(1), unless the authorization is terminated or revoked.     Resp Syncytial Virus by PCR NEGATIVE NEGATIVE Final    Comment: (NOTE) Fact Sheet for Patients: EntrepreneurPulse.com.au  Fact Sheet for Healthcare Providers: IncredibleEmployment.be  This test is not yet approved or cleared by the [place]  FDA and has been authorized for detection and/or diagnosis of SARS-CoV-2 by FDA under an Emergency Use Authorization (EUA). This EUA will remain in effect (meaning this test can be used) for the duration of the COVID-19 declaration under Section 564(b)(1) of the Act, 21 U.S.C. section 360bbb-3(b)(1), unless the authorization is terminated or revoked.  Performed at [facility], [address], [place], [place] [address]   MRSA Next Gen by PCR, Nasal     Status: None   Collection Time: 08/11/22 11:57 PM   Specimen: Nasal Mucosa; Nasal Swab  Result Value Ref Range Status   MRSA by PCR Next Gen NOT DETECTED NOT DETECTED Final    Comment: (NOTE) The GeneXpert MRSA Assay (FDA approved for NASAL specimens only), is one component of a comprehensive MRSA colonization surveillance program. It is not intended to diagnose MRSA infection nor to guide or monitor treatment for MRSA infections. Test performance is not FDA approved in patients less than 3 years old. Performed at [facility], [place] [address] [place], [place] [address]          Radiology Studies: CT ABDOMEN PELVIS WO CONTRAST  Result Date: 08/17/2022 CLINICAL DATA:  Evaluate for retroperitoneal hematoma. Status post cardiac catheterization on 07/22/2022. EXAM: CT ABDOMEN AND PELVIS WITHOUT CONTRAST TECHNIQUE: Multidetector CT imaging of the abdomen and pelvis was performed following the standard protocol without IV contrast. RADIATION DOSE REDUCTION: This exam was performed according to the  departmental dose-optimization program which includes automated exposure control, adjustment of the mA and/or kV according to patient size and/or use of iterative reconstruction technique. COMPARISON:  04/07/2011 FINDINGS: Lower chest: Calcifications of the aortic and mitral valve. Heart size normal. No pericardial effusion. Small bilateral pleural effusions with overlying compressive type atelectasis. Hepatobiliary: No focal liver abnormality. Status post cholecystectomy. Increase caliber of the CBD measures up to 1.1 cm. Pancreas: Unremarkable. No pancreatic ductal dilatation or surrounding inflammatory changes. Spleen: Normal in size without focal abnormality. Adrenals/Urinary Tract: Normal adrenal glands. Asymmetric left renal atrophy. Left hydronephrosis and hydroureter identified. I suspect this reflects mass effect from left pelvic hematoma. Right hydronephrosis and hydroureter is noted. No obstructing stone identified. Left pelvic hematoma has mass effect upon the bladder which is displaced towards the right pelvic sidewall. Stomach/Bowel: Stomach appears normal. No bowel wall thickening, inflammation, or distension. Vascular/Lymphatic: Aortic atherosclerosis. No aneurysm. No abdominopelvic adenopathy. Reproductive: The uterus is either atrophic or surgically absent. No suspicious adnexal mass. Other: Small volume of hemoperitoneum is identified with blood extending along bilateral paracolic gutters. There is a large hyperdense mass within the left hemipelvis which extends across the midline compatible with a hematoma. This measures 12.8 x 10.2 by 10.5 cm (volume = 718 cm^3). Diffuse body wall edema is identified compatible with anasarca. Musculoskeletal: No acute or suspicious osseous findings. Right hip arthroplasty. Lumbar degenerative disc disease. IMPRESSION: 1. Large left pelvic hematoma is identified with mass effect upon the bladder. This has an approximate volume of 718 cc. 2. Small volume of  hemoperitoneum is identified with blood extending along bilateral paracolic gutters. 3. Bilateral hydronephrosis and hydroureter. I suspect this reflects mass effect from left pelvic hematoma. 4. Small bilateral pleural effusions with overlying compressive type atelectasis. 5. Diffuse body wall edema compatible with anasarca. 6. Increase caliber of the CBD measures up to 1.1 cm status post cholecystectomy. 7.  Aortic Atherosclerosis (ICD10-I70.0). Electronically Signed   By: Kerby Moors M.D.   On: 08/17/2022 15:20        Scheduled  Meds:  [START ON 15-Sep-2022] diphenhydrAMINE  50 mg Oral Once   famotidine  20 mg Oral Daily   lacosamide  50 mg Oral BID   methylPREDNISolone (SOLU-MEDROL) injection  40 mg Intravenous Once   metoprolol succinate  25 mg Oral Daily   predniSONE  50 mg Oral Once   [START ON 09/15/22] predniSONE  50 mg Oral Once   [START ON 2022-09-15] predniSONE  50 mg Oral Once   rosuvastatin  20 mg Oral Daily   sodium chloride flush  3 mL Intravenous Q12H   Continuous Infusions:  sodium chloride       LOS: 8 days   45 minutes spent in the care of this critically ill patient including medical decision making, review of labs and studies, discussion of care with specialists and physical examination at the bedside   Annita Brod, MD [facility]   To contact the attending provider between 7A-7P or the covering provider during after hours 7P-7A, please log into the web site [email or web address] and access using universal [place] password for that web site. If you do not have the password, please call the hospital operator.  08/18/2022, 3:04 PM

## 2022-08-18 NOTE — TOC Transition Note (Signed)
Discharge medications (1) have been retrieved from [facility] weekend lockup and are now being stored in the Transitions of Care [facility]acy on the second floor until patient is ready for discharge.

## 2022-08-18 NOTE — Progress Notes (Signed)
PT Cancellation Note  Patient Details Name: Tracy Peters MRN: [id number] DOB: December 09, 1933   Cancelled Treatment:    Reason Eval/Treat Not Completed: (P) Medical issues which prohibited therapy (pt dizzy in supine, noted low Na, RN defer.) Plan for CTA next date per chart review. Will continue efforts per PT plan of care as schedule permits once medically appropriate.   Kara Pacer Briannon Boggio 08/18/2022, 3:35 PM

## 2022-08-18 NOTE — Progress Notes (Signed)
IR asked to evaluate patient for possible intervention for RP bleed. Case reviewed by Dr. Pascal Lux who recommends continuing to hold anticoagulation and continue monitoring H&H. If patient shows signs of clinical worsening by tomorrow morning CTA is recommended.   Patient has an allergy to contrast. 13 hour contrast  prep has been ordered and is scheduled to start tonight at 8 pm. CTA (GI bleed protocol) is tentatively scheduled for tomorrow morning at 0900. If patient is clinically stable IR recommends not performing CTA and to just continue conservative management.   Dr. Maryland Pink made aware and is in agreement with this plan. IR will continue to monitor.   Soyla Dryer, [place] [phone] 08/18/2022, 12:59 PM

## 2022-08-18 NOTE — Progress Notes (Signed)
CARDIAC REHAB PHASE I   Stopped by to check on pt and offer ambulation. However pt is feeling poorly this morning. She appears pale and weak. MD has just placed stat H/H. Husband at bedside, emotional encouragement and support given to pt and husband. Will continue to follow.    [id number]   Vanessa Barbara, RN BSN 08/18/2022 10:30 AM

## 2022-08-19 LAB — BASIC METABOLIC PANEL
Anion gap: 6 (ref 5–15)
BUN: 29 mg/dL — ABNORMAL HIGH (ref 8–23)
CO2: 23 mmol/L (ref 22–32)
Calcium: 8.1 mg/dL — ABNORMAL LOW (ref 8.9–10.3)
Chloride: 92 mmol/L — ABNORMAL LOW (ref 98–111)
Creatinine, Ser: 1.36 mg/dL — ABNORMAL HIGH (ref 0.44–1.00)
GFR, Estimated: 38 mL/min — ABNORMAL LOW (ref >60)
Glucose, Bld: 122 mg/dL — ABNORMAL HIGH (ref 70–99)
Potassium: 4.6 mmol/L (ref 3.5–5.1)
Sodium: 121 mmol/L — ABNORMAL LOW (ref 135–145)

## 2022-08-19 LAB — CBC
HCT: 23.4 % — ABNORMAL LOW (ref 36.0–46.0)
Hemoglobin: 8 g/dL — ABNORMAL LOW (ref 12.0–15.0)
MCH: 31.4 pg (ref 26.0–34.0)
MCHC: 34.2 g/dL (ref 30.0–36.0)
MCV: 91.8 fL (ref 80.0–100.0)
Platelets: 278 10*3/uL (ref 150–400)
RBC: 2.55 MIL/uL — ABNORMAL LOW (ref 3.87–5.11)
RDW: 16.9 % — ABNORMAL HIGH (ref 11.5–15.5)
WBC: 22.9 10*3/uL — ABNORMAL HIGH (ref 4.0–10.5)
nRBC: 0.1 % (ref 0.0–0.2)

## 2022-08-19 LAB — TROPONIN I (HIGH SENSITIVITY): Troponin I (High Sensitivity): 56 ng/L — ABNORMAL HIGH (ref <18)

## 2022-08-19 LAB — GLUCOSE, CAPILLARY: Glucose-Capillary: 121 mg/dL — ABNORMAL HIGH (ref 70–99)

## 2022-08-22 ENCOUNTER — Other Ambulatory Visit (HOSPITAL_COMMUNITY): Payer: Self-pay

## 2022-08-26 ENCOUNTER — Ambulatory Visit: Payer: Medicare Other

## 2022-09-02 ENCOUNTER — Ambulatory Visit: Payer: Medicare Other | Admitting: Internal Medicine

## 2022-09-09 MED FILL — Medication: Qty: 1 | Status: AC

## 2022-09-15 NOTE — Plan of Care (Signed)
Called Husband to offer support during this difficult time.  Rudean Haskell, MD [place], [address] [place], [place] [address] [phone]  7:55 AM

## 2022-09-15 NOTE — Death Summary Note (Signed)
DEATH SUMMARY   Patient Details  Name: Tracy Peters MRN: [id number] DOB: 1934/02/05 [id number], Tracy Lacks, MD Admission/Discharge Information   Admit Date:  08-21-22  Date of Death: Date of Death: 2022/08/30  Time of Death: Time of Death: 0257  Length of Stay: 9   Principle Cause of death: Retroperitoneal bleed causing cardiac arrest  Hospital Diagnoses: Principal Problem:   Elevated troponin I level Active Problems:   Essential hypertension   NSTEMI (non-ST elevated myocardial infarction) (HCC)   Elevated d-dimer   Elevated troponin   Severe aortic stenosis   Syncope and collapse   Hospital Course: Tracy Peters is a 89 y.o. female past medical history of DVT on Coumadin, peripheral vascular disease & hypertension who presented to the ED on 21-Aug-2022 for shortness of breath x 2 weeks, progressively worsening as well as 2 days of chest pain at rest.  The emergency room, patient found to have new onset heart failure with a BNP of 1500 and chest x-ray noting pulmonary edema.  Patient admitted for treatment of new onset acute congestive heart failure.  Cardiology consulted.  Workup revealed severe aortic stenosis.  Patient underwent left and right heart catheterization revealing mild pulmonary hypertension and confirming severe calcific aortic stenosis.  CT surgery consulted and are recommending TAVR.  Unfortunately, this cannot be done until 3/12.   Initial plan had been to discharge patient on 3/2 after monitoring.  On evening of 3/1, patient started having episodes of orthostatic hypotension causing near syncope.  On 3/3, patient's hemoglobin down to 7.6, down from 9.6 one day previous.  Patient also with increasing creatinine.  Patient had also been complaining of some left sided pelvic pain and CT scan done 3/3 revealed large pelvic hematoma.  Aspirin and Eliquis put on hold.  By 3/4, hemoglobin had improved with blood transfusion however showing other signs of  continued bleeding including worsening renal function and leukocytosis.  At rest, patient becoming tachycardic although blood pressure stable, although still orthostatic.  Unable to give additional volume with fluids or blood given stenosis and volume overload, but unable to diurese due to orthostasis and blood loss.  Interventional radiology consulted with plans for CT angiogram and embolization if possible.  Unable to do until 3/5 due to need for contrast protocol as patient had severe contrast allergy listed.  Unfortunately, patient arrested on early morning hours of 3/5 and expired.  Assessment and Plan: Principal Problem:   Elevated troponin I level Active Problems:   Essential hypertension   NSTEMI (non-ST elevated myocardial infarction) (HCC)   Elevated d-dimer   Elevated troponin   Severe aortic stenosis   Syncope and collapse   Pelvic bleed causing acute blood loss anemia Eliquis on aspirin on hold.  Transfuse 1 unit packed red blood cells.  Although hemoglobin itself has not changed, creatinine continues to trend upward as does white blood cell count (likely from stress margination from bleeding) and patient has become more tachycardic.  Discussed with cardiology and IR consulted for CT angiogram and possible embolization.   Orthostatic hypotension See above, felt to be secondary to bleed     Shortness of Breath HFpEF  secondary to severe aortic Stenosis Appreciate cardiology help.  IV Lasix changed to p.o.  Status post left and right heart catheterization.  Due to surgery recommending TAVR, which could not be done until 3/12.  Then went back into heart failure secondary to IV fluids and then blood transfusion with minimal amount of Lasix able to be  given due to ongoing orthostasis and blood loss.   Chest Pain  NSTEMI  Elevated Troponin Appreciate cardiology recommendations.  Elevated troponins initially thought to be demand ischemia in the setting of heart failure and aortic  stenosis, but wall motion abnormalities on 2D echo concerning for underlying CAD.  Status post left and right heart catheterization with no significant coronary lesions noted.  Continue heparin infusion plus aspirin.  Continue Crestor, LDL at 104 with target below 70.  When bleeding became apparent, aspirin heparin discontinued.  Antihypertensives on hold.   Hyponatremia Secondary to acute heart failure from fluids and blood transfused.  Unable to diurese at this time until bleeding/hypotension has resolved.   Essential hypertension Metoprolol, losartan   Left hip pain Appeared to be below left lower quadrant of abdomen, above the groin.  Tender to deep palpation.  As above.  Pain going on for several days.  Felt to be secondary to retroperitoneal bleed.  Hx DVT Previously was on Coumadin and then Coumadin on hold and changed over to heparin.  Cardiology has since changed to Eliquis for better management, but this was discontinued after bleed        Consultants:  Cardiology Cardiothoracic surgery Interventional radiology   Procedures:  Status post left and right heart catheterization Planned CT angiogram with possible embolization intervention Echocardiogram with severe aortic stenosis 1 unit packed red blood cell transfusion  The results of significant diagnostics from this hospitalization (including imaging, microbiology, ancillary and laboratory) are listed below for reference.   Significant Diagnostic Studies: CT ABDOMEN PELVIS WO CONTRAST  Result Date: 08/17/2022 CLINICAL DATA:  Evaluate for retroperitoneal hematoma. Status post cardiac catheterization on 07/24/2022. EXAM: CT ABDOMEN AND PELVIS WITHOUT CONTRAST TECHNIQUE: Multidetector CT imaging of the abdomen and pelvis was performed following the standard protocol without IV contrast. RADIATION DOSE REDUCTION: This exam was performed according to the departmental dose-optimization program which includes automated exposure  control, adjustment of the mA and/or kV according to patient size and/or use of iterative reconstruction technique. COMPARISON:  04/07/2011 FINDINGS: Lower chest: Calcifications of the aortic and mitral valve. Heart size normal. No pericardial effusion. Small bilateral pleural effusions with overlying compressive type atelectasis. Hepatobiliary: No focal liver abnormality. Status post cholecystectomy. Increase caliber of the CBD measures up to 1.1 cm. Pancreas: Unremarkable. No pancreatic ductal dilatation or surrounding inflammatory changes. Spleen: Normal in size without focal abnormality. Adrenals/Urinary Tract: Normal adrenal glands. Asymmetric left renal atrophy. Left hydronephrosis and hydroureter identified. I suspect this reflects mass effect from left pelvic hematoma. Right hydronephrosis and hydroureter is noted. No obstructing stone identified. Left pelvic hematoma has mass effect upon the bladder which is displaced towards the right pelvic sidewall. Stomach/Bowel: Stomach appears normal. No bowel wall thickening, inflammation, or distension. Vascular/Lymphatic: Aortic atherosclerosis. No aneurysm. No abdominopelvic adenopathy. Reproductive: The uterus is either atrophic or surgically absent. No suspicious adnexal mass. Other: Small volume of hemoperitoneum is identified with blood extending along bilateral paracolic gutters. There is a large hyperdense mass within the left hemipelvis which extends across the midline compatible with a hematoma. This measures 12.8 x 10.2 by 10.5 cm (volume = 718 cm^3). Diffuse body wall edema is identified compatible with anasarca. Musculoskeletal: No acute or suspicious osseous findings. Right hip arthroplasty. Lumbar degenerative disc disease. IMPRESSION: 1. Large left pelvic hematoma is identified with mass effect upon the bladder. This has an approximate volume of 718 cc. 2. Small volume of hemoperitoneum is identified with blood extending along bilateral paracolic  gutters. 3. Bilateral hydronephrosis  and hydroureter. I suspect this reflects mass effect from left pelvic hematoma. 4. Small bilateral pleural effusions with overlying compressive type atelectasis. 5. Diffuse body wall edema compatible with anasarca. 6. Increase caliber of the CBD measures up to 1.1 cm status post cholecystectomy. 7.  Aortic Atherosclerosis (ICD10-I70.0). Electronically Signed   By: Kerby Moors M.D.   On: 08/17/2022 15:20   CT ANGIO ABDOMEN PELVIS  W &/OR WO CONTRAST  Result Date: 08/15/2022 CLINICAL DATA:  89 year old female with history of severe aortic stenosis. Preprocedural study prior to potential transcatheter aortic valve replacement (TAVR) procedure. EXAM: CT ANGIOGRAPHY CHEST, ABDOMEN AND PELVIS TECHNIQUE: Multidetector CT imaging through the chest, abdomen and pelvis was performed using the standard protocol during bolus administration of intravenous contrast. Multiplanar reconstructed images and MIPs were obtained and reviewed to evaluate the vascular anatomy. RADIATION DOSE REDUCTION: This exam was performed according to the departmental dose-optimization program which includes automated exposure control, adjustment of the mA and/or kV according to patient size and/or use of iterative reconstruction technique. CONTRAST:  153m OMNIPAQUE IOHEXOL 350 MG/ML SOLN COMPARISON:  None Available. FINDINGS: CTA CHEST FINDINGS Cardiovascular: Heart size is normal. There is no significant pericardial fluid, thickening or pericardial calcification. There is aortic atherosclerosis, as well as atherosclerosis of the great vessels of the mediastinum and the coronary arteries, including calcified atherosclerotic plaque in the left main coronary artery. Severe thickening and calcification of the aortic valve. Mediastinum/Lymph Nodes: No pathologically enlarged mediastinal or hilar lymph nodes. Esophagus is unremarkable in appearance. No axillary lymphadenopathy. Lungs/Pleura: Small to moderate  bilateral pleural effusions lying dependently with passive areas of subsegmental atelectasis in the lungs bilaterally. No confluent consolidative airspace disease. No definite suspicious appearing pulmonary nodules or masses are noted. Musculoskeletal/Soft Tissues: Orthopedic fixation hardware in the cervical spine incidentally noted. There are no aggressive appearing lytic or blastic lesions noted in the visualized portions of the skeleton. CTA ABDOMEN AND PELVIS FINDINGS Hepatobiliary: No suspicious cystic or solid hepatic lesions. No intra or extrahepatic biliary ductal dilatation. Status post cholecystectomy. Pancreas: No pancreatic mass. No pancreatic ductal dilatation. No pancreatic or peripancreatic fluid collections or inflammatory changes. Spleen: Unremarkable. Adrenals/Urinary Tract: Severe atrophy of the left kidney. Multifocal cortical scarring in both kidneys. Right kidney and bilateral adrenal glands are otherwise unremarkable in appearance. No hydroureteronephrosis. Urinary bladder is normal in appearance. Stomach/Bowel: The appearance of the stomach is normal. There is no pathologic dilatation of small bowel or colon. The appendix is not confidently identified and may be surgically absent. Regardless, there are no inflammatory changes noted adjacent to the cecum to suggest the presence of an acute appendicitis at this time. Vascular/Lymphatic: Atherosclerosis throughout the abdominal and pelvic vasculature, with vascular findings and measurements pertinent to potential TAVR procedure, as detailed below. No lymphadenopathy noted in the abdomen or pelvis. Reproductive: Status post hysterectomy.  Ovaries are atrophic. Other: No significant volume of ascites.  No pneumoperitoneum. Musculoskeletal: Status post right hip arthroplasty. There are no aggressive appearing lytic or blastic lesions noted in the visualized portions of the skeleton. VASCULAR MEASUREMENTS PERTINENT TO TAVR: AORTA: Minimal Aortic  Diameter-13 x 12 mm Severity of Aortic Calcification-moderate to severe RIGHT PELVIS: Right Common Iliac Artery - Minimal Diameter-5.5 x 6.7 mm Tortuosity-moderate Calcification-moderate Right External Iliac Artery - Minimal Diameter-5.6 x 6.0 mm Tortuosity-mild Calcification-none Right Common Femoral Artery - Minimal Diameter-4.8 x 5.8 mm Tortuosity-mild Calcification-mild LEFT PELVIS: Left Common Iliac Artery - Minimal Diameter-5.5 x 6.3 mm Tortuosity-severe Calcification-moderate Left External Iliac Artery - Minimal Diameter-6.2 x  5.7 mm Tortuosity-mild Calcification-mild Left Common Femoral Artery - Minimal Diameter-5.1 x 5.1 mm Tortuosity-mild Calcification-mild Review of the MIP images confirms the above findings. IMPRESSION: 1. Vascular findings and measurements pertinent to potential TAVR procedure, as detailed above. 2. Severe thickening and calcification of the aortic valve, compatible with reported clinical history of severe aortic stenosis. 3. Small to moderate bilateral pleural effusions with areas of passive subsegmental atelectasis throughout the lungs bilaterally. 4. Aortic atherosclerosis, in addition to left main coronary artery disease. 5. Additional incidental findings, as above. Electronically Signed   By: Vinnie Langton M.D.   On: 08/15/2022 07:53   CT ANGIO CHEST AORTA W/CM & OR WO/CM  Result Date: 08/15/2022 CLINICAL DATA:  89 year old female with history of severe aortic stenosis. Preprocedural study prior to potential transcatheter aortic valve replacement (TAVR) procedure. EXAM: CT ANGIOGRAPHY CHEST, ABDOMEN AND PELVIS TECHNIQUE: Multidetector CT imaging through the chest, abdomen and pelvis was performed using the standard protocol during bolus administration of intravenous contrast. Multiplanar reconstructed images and MIPs were obtained and reviewed to evaluate the vascular anatomy. RADIATION DOSE REDUCTION: This exam was performed according to the departmental dose-optimization  program which includes automated exposure control, adjustment of the mA and/or kV according to patient size and/or use of iterative reconstruction technique. CONTRAST:  135m OMNIPAQUE IOHEXOL 350 MG/ML SOLN COMPARISON:  None Available. FINDINGS: CTA CHEST FINDINGS Cardiovascular: Heart size is normal. There is no significant pericardial fluid, thickening or pericardial calcification. There is aortic atherosclerosis, as well as atherosclerosis of the great vessels of the mediastinum and the coronary arteries, including calcified atherosclerotic plaque in the left main coronary artery. Severe thickening and calcification of the aortic valve. Mediastinum/Lymph Nodes: No pathologically enlarged mediastinal or hilar lymph nodes. Esophagus is unremarkable in appearance. No axillary lymphadenopathy. Lungs/Pleura: Small to moderate bilateral pleural effusions lying dependently with passive areas of subsegmental atelectasis in the lungs bilaterally. No confluent consolidative airspace disease. No definite suspicious appearing pulmonary nodules or masses are noted. Musculoskeletal/Soft Tissues: Orthopedic fixation hardware in the cervical spine incidentally noted. There are no aggressive appearing lytic or blastic lesions noted in the visualized portions of the skeleton. CTA ABDOMEN AND PELVIS FINDINGS Hepatobiliary: No suspicious cystic or solid hepatic lesions. No intra or extrahepatic biliary ductal dilatation. Status post cholecystectomy. Pancreas: No pancreatic mass. No pancreatic ductal dilatation. No pancreatic or peripancreatic fluid collections or inflammatory changes. Spleen: Unremarkable. Adrenals/Urinary Tract: Severe atrophy of the left kidney. Multifocal cortical scarring in both kidneys. Right kidney and bilateral adrenal glands are otherwise unremarkable in appearance. No hydroureteronephrosis. Urinary bladder is normal in appearance. Stomach/Bowel: The appearance of the stomach is normal. There is no  pathologic dilatation of small bowel or colon. The appendix is not confidently identified and may be surgically absent. Regardless, there are no inflammatory changes noted adjacent to the cecum to suggest the presence of an acute appendicitis at this time. Vascular/Lymphatic: Atherosclerosis throughout the abdominal and pelvic vasculature, with vascular findings and measurements pertinent to potential TAVR procedure, as detailed below. No lymphadenopathy noted in the abdomen or pelvis. Reproductive: Status post hysterectomy.  Ovaries are atrophic. Other: No significant volume of ascites.  No pneumoperitoneum. Musculoskeletal: Status post right hip arthroplasty. There are no aggressive appearing lytic or blastic lesions noted in the visualized portions of the skeleton. VASCULAR MEASUREMENTS PERTINENT TO TAVR: AORTA: Minimal Aortic Diameter-13 x 12 mm Severity of Aortic Calcification-moderate to severe RIGHT PELVIS: Right Common Iliac Artery - Minimal Diameter-5.5 x 6.7 mm Tortuosity-moderate Calcification-moderate Right External Iliac Artery -  Minimal Diameter-5.6 x 6.0 mm Tortuosity-mild Calcification-none Right Common Femoral Artery - Minimal Diameter-4.8 x 5.8 mm Tortuosity-mild Calcification-mild LEFT PELVIS: Left Common Iliac Artery - Minimal Diameter-5.5 x 6.3 mm Tortuosity-severe Calcification-moderate Left External Iliac Artery - Minimal Diameter-6.2 x 5.7 mm Tortuosity-mild Calcification-mild Left Common Femoral Artery - Minimal Diameter-5.1 x 5.1 mm Tortuosity-mild Calcification-mild Review of the MIP images confirms the above findings. IMPRESSION: 1. Vascular findings and measurements pertinent to potential TAVR procedure, as detailed above. 2. Severe thickening and calcification of the aortic valve, compatible with reported clinical history of severe aortic stenosis. 3. Small to moderate bilateral pleural effusions with areas of passive subsegmental atelectasis throughout the lungs bilaterally. 4. Aortic  atherosclerosis, in addition to left main coronary artery disease. 5. Additional incidental findings, as above. Electronically Signed   By: Vinnie Langton M.D.   On: 08/15/2022 07:53   CT CORONARY MORPH W/CTA COR W/SCORE W/CA W/CM &/OR WO/CM  Addendum Date: 08/15/2022   ADDENDUM REPORT: 08/15/2022 06:57 ADDENDUM: Extracardiac findings will be described separately under dictation for contemporaneously obtained CTA chest, abdomen and pelvis dated 08/14/2022. Please see that dictation for full description of relevant extracardiac findings. Electronically Signed   By: Vinnie Langton M.D.   On: 08/15/2022 06:57   Result Date: 08/15/2022 CLINICAL DATA:  Aortic Valve pathology with assessment for TAVR EXAM: Cardiac TAVR CT TECHNIQUE: The patient was scanned on a Siemens Force [id number] slice scanner. A 120 kV retrospective scan was triggered in the descending thoracic aorta at 111 HU's. Gantry rotation speed was 270 msecs and collimation was .9 mm. No beta blockade or nitro were given. The 3D data set was reconstructed in 5% intervals of the R-R cycle. Systolic and diastolic phases were analyzed on a dedicated work station using MPR, MIP and VRT modes. The patient received 100 cc of contrast. FINDINGS: Aortic Valve: Severely thickened aortic valve with heavy calcification and reduced excursion the planimeter valve area is 0.741 Sq cm consistent with critical aortic stenosis Number of leaflets: Three LVOT calcification: None Annular calcification: None Aortic Valve Calcium Score: 4115 Perimembranous septal diameter: 5 mm Mitral Valve: No stenosis without significant MAC Aortic Annulus Measurements- 30% Phase Major annulus diameter: 24 mm Minor annulus diameter: 21 mm Annular perimeter: 70 mm Annular area: 3.80 cm2 Aortic Root Measurements Sinotubular Junction: 27 mm Ascending Thoracic Aorta: 35 mm Aortic Arch: 28 mm Descending Thoracic Aorta: 23 mm Aortic atherosclerosis. Sinus of Valsalva Measurements: Right coronary  cusp width: 28 mm Left coronary cusp width: 29 mm Non coronary cusp width: 28 mm Coronary Artery Height above Annulus: Left Main: 15 mm Left SoV height: 21 mm Right Coronary: 15 mm Right SoV height: 21 mm Optimum Fluoroscopic Angle for Delivery: LAO 14, CAU 11 Cusp overlay view angle:RAO 0, CAU 27 Valves for structural team consideration: 23 mm Sapien Valve There are sufficient sinus dimensions to a 26 mm Core Valve Non TAVR Valve Findings: Coronary Arteries: Normal coronary origin. Study not completed with nitroglycerin. Coronary Calcium Score: Left main: 168 Left anterior descending artery: 1 Left circumflex artery: 1 Right coronary artery: 215 Total: 385 Systemic veins: Normal drainage Main Pulmonary artery: Normal dimensions Pulmonary veins: Normal variant anatomy-presence of a right middle pulmonary vein Left atrial appendage: Decreased filling in the distal tip of the left atrium; more consistent delayed filling that true thrombus. Interatrial septum: No communications. Extra Cardiac Findings as per separate reporting. IMPRESSION: 1. Critical Aortic stenosis. Findings pertinent to TAVR procedure are detailed above. RECOMMENDATIONS: The proposed cut-off value of  1,651 AU yielded a 93 % sensitivity and 75 % specificity in grading AS severity in patients with classical low-flow, low-gradient AS. Proposed different cut-off values to define severe AS for men and women as 2,065 AU and 1,274 AU, respectively. The joint European and American recommendations for the assessment of AS consider the aortic valve calcium score as a continuum - a very high calcium score suggests severe AS and a low calcium score suggests severe AS is unlikely. Kerman Passey, et al. 2017 ESC/EACTS Guidelines for the management of valvular heart disease. Eur Heart J [phone] Coronary artery calcium (CAC) score is a strong predictor of incident coronary heart disease (CHD) and provides predictive information beyond  traditional risk factors. CAC scoring is reasonable to use in the decision to withhold, postpone, or initiate statin therapy in intermediate-risk or selected borderline-risk asymptomatic adults (age 47-75 years and LDL-C >=70 to <190 mg/dL) who do not have diabetes or established atherosclerotic cardiovascular disease (ASCVD).* In intermediate-risk (10-year ASCVD risk >=7.5% to <20%) adults or selected borderline-risk (10-year ASCVD risk >=5% to <7.5%) adults in whom a CAC score is measured for the purpose of making a treatment decision the following recommendations have been made: If CAC = 0, it is reasonable to withhold statin therapy and reassess in 5 to 10 years, as long as higher risk conditions are absent (diabetes mellitus, family history of premature CHD in first degree relatives (males <55 years; females <65 years), cigarette smoking, LDL >=190 mg/dL or other independent risk factors). If CAC is 1 to 99, it is reasonable to initiate statin therapy for patients >=54 years of age. If CAC is >=100 or >=75th percentile, it is reasonable to initiate statin therapy at any age. Cardiology referral should be considered for patients with CAC scores >=400 or >=75th percentile. *2018 AHA/ACC/AACVPR/AAPA/ABC/ACPM/ADA/AGS/APhA/ASPC/NLA/PCNA Guideline on the Management of Blood Cholesterol: A Report of the American College of Cardiology/American Heart Association Task Force on Clinical Practice Guidelines. J Am Coll Cardiol. 2019;73(24):3168-3209. Mahesh  Chandrasekhar Electronically Signed: By: Rudean Haskell M.D. On: 08/14/2022 18:03   CARDIAC CATHETERIZATION  Result Date: 08/13/2022 1.  Mild aorto ostial disease involving the ostial RCA and the ostial left main with less than 50% stenosis at both lesion sites 2.  Patent LAD, left circumflex, and RCA without any high-grade stenoses 3.  Mild pulmonary hypertension with mean PA pressure 26 mmHg, mildly increased wedge pressure of 19 mmHg, and otherwise normal  right heart hemodynamics.  Preserved cardiac output and index of 4.6 L/min and 2.99 L/min/m 4.  Known severe calcific aortic stenosis with heavy calcification and restriction of the aortic valve leaflets seen on plain fluoroscopy Recommendations: Resume heparin in 8 hours.  Continue TAVR evaluation with CT angiogram studies and formal cardiac surgical consultation as previously outlined   ECHOCARDIOGRAM COMPLETE  Result Date: 08/11/2022    ECHOCARDIOGRAM REPORT   Patient Name:   INDIYA DARITY [facility] Date of Exam: 08/11/2022 Medical Rec #:  [id number]              Height:       64.0 in Accession #:    [id number]             Weight:       120.0 lb Date of Birth:  [birth year]               BSA:          1.575 m Patient Age:    29 years  BP:           118/68 mmHg Patient Gender: F                      HR:           97 bpm. Exam Location:  Inpatient Procedure: 2D Echo, Cardiac Doppler and Color Doppler Indications:    [id number] Acute diastolic (congestive) heart failure  History:        Patient has no prior history of Echocardiogram examinations.                 CAD, Signs/Symptoms:Shortness of Breath; Risk                 Factors:Non-Smoker, Hypertension and Dyslipidemia.  Sonographer:    Wilkie Aye RVT RCS Referring Phys: [place]  Sonographer Comments: Suboptimal parasternal window. IMPRESSIONS  1. Left ventricular ejection fraction, by estimation, is 55 to 60%. The left ventricle has normal function. The left ventricle has no regional wall motion abnormalities. Left ventricular diastolic function could not be evaluated. There is severe hypokinesis of the left ventricular, basal-mid inferoseptal wall and inferior wall.  2. Right ventricular systolic function is normal. The right ventricular size is normal. There is normal pulmonary artery systolic pressure. The estimated right ventricular systolic pressure is Q000111Q mmHg.  3. The mitral valve is degenerative. Mild mitral valve  regurgitation. No evidence of mitral stenosis. Moderate mitral annular calcification.  4. The aortic valve is calcified. There is severe calcifcation of the aortic valve. There is severe thickening of the aortic valve. Aortic valve regurgitation is not visualized. Severe aortic valve stenosis. Aortic valve area, by VTI measures 0.46 cm. Aortic valve mean gradient measures 18.0 mmHg. Aortic valve Vmax measures 3.02 m/s. Although the mean AVG is mild Vmax is not in the severe range, the SVI is low at 15 and DVI 0.18. These findings are consistent with paradoxical low flow low gradient severe AS.  5. The inferior vena cava is normal in size with greater than 50% respiratory variability, suggesting right atrial pressure of 3 mmHg. FINDINGS  Left Ventricle: Left ventricular ejection fraction, by estimation, is 55 to 60%. The left ventricle has normal function. The left ventricle has no regional wall motion abnormalities. Severe hypokinesis of the left ventricular, basal-mid inferoseptal wall and inferior wall. The left ventricular internal cavity size was normal in size. There is no left ventricular hypertrophy. Left ventricular diastolic function could not be evaluated. Right Ventricle: The right ventricular size is normal. No increase in right ventricular wall thickness. Right ventricular systolic function is normal. There is normal pulmonary artery systolic pressure. The tricuspid regurgitant velocity is 2.31 m/s, and  with an assumed right atrial pressure of 3 mmHg, the estimated right ventricular systolic pressure is Q000111Q mmHg. Left Atrium: Left atrial size was normal in size. Right Atrium: Right atrial size was normal in size. Pericardium: There is no evidence of pericardial effusion. Mitral Valve: The mitral valve is degenerative in appearance. There is moderate thickening of the mitral valve leaflet(s). There is moderate calcification of the mitral valve leaflet(s). Moderate mitral annular calcification. Mild  mitral valve regurgitation. No evidence of mitral valve stenosis. MV peak gradient, 5.5 mmHg. The mean mitral valve gradient is 2.0 mmHg. Tricuspid Valve: The tricuspid valve is normal in structure. Tricuspid valve regurgitation is mild . No evidence of tricuspid stenosis. Aortic Valve: The aortic valve is calcified. There is severe calcifcation of the aortic valve. There  is severe thickening of the aortic valve. Aortic valve regurgitation is not visualized. Severe aortic stenosis is present. Aortic valve mean gradient measures 18.0 mmHg. Aortic valve peak gradient measures 36.5 mmHg. Aortic valve area, by VTI measures 0.46 cm. Pulmonic Valve: The pulmonic valve was normal in structure. Pulmonic valve regurgitation is trivial. No evidence of pulmonic stenosis. Aorta: The aortic root is normal in size and structure. Venous: The inferior vena cava is normal in size with greater than 50% respiratory variability, suggesting right atrial pressure of 3 mmHg. IAS/Shunts: No atrial level shunt detected by color flow Doppler.  LEFT VENTRICLE PLAX 2D LVIDd:         4.00 cm LVIDs:         2.80 cm LV PW:         0.90 cm LV IVS:        1.00 cm LVOT diam:     1.80 cm LV SV:         23 LV SV Index:   15 LVOT Area:     2.54 cm  RIGHT VENTRICLE             IVC RV Basal diam:  2.80 cm     IVC diam: 1.70 cm RV S prime:     15.70 cm/s TAPSE (M-mode): 2.5 cm LEFT ATRIUM             Index        RIGHT ATRIUM          Index LA diam:        3.80 cm 2.41 cm/m   RA Area:     6.82 cm LA Vol (A2C):   30.3 ml 19.24 ml/m  RA Volume:   14.40 ml 9.15 ml/m LA Vol (A4C):   46.3 ml 29.40 ml/m LA Biplane Vol: 38.6 ml 24.51 ml/m  AORTIC VALVE                     PULMONIC VALVE AV Area (Vmax):    0.53 cm      PV Vmax:          1.01 m/s AV Area (Vmean):   0.53 cm      PV Peak grad:     4.1 mmHg AV Area (VTI):     0.46 cm      PR End Diast Vel: 6.55 msec AV Vmax:           302.00 cm/s AV Vmean:          193.000 cm/s AV VTI:            0.501 m  AV Peak Grad:      36.5 mmHg AV Mean Grad:      18.0 mmHg LVOT Vmax:         62.50 cm/s LVOT Vmean:        39.900 cm/s LVOT VTI:          0.090 m LVOT/AV VTI ratio: 0.18  AORTA Ao Root diam: 2.50 cm Ao Asc diam:  3.30 cm Ao Arch diam: 2.3 cm MITRAL VALVE             TRICUSPID VALVE MV Area VTI:  1.23 cm   TR Peak grad:   21.3 mmHg MV Peak grad: 5.5 mmHg   TR Vmax:        231.00 cm/s MV Mean grad: 2.0 mmHg MV Vmax:      1.17 m/s   SHUNTS MV Vmean:  70.7 cm/s  Systemic VTI:  0.09 m                          Systemic Diam: 1.80 cm Fransico Him MD Electronically signed by Fransico Him MD Signature Date/Time: 08/11/2022/9:11:31 AM    Final    NM Pulmonary Perfusion  Result Date: 07/31/2022 CLINICAL DATA:  Pulmonary embolism (PE) suspected, high prob Shortness of breath.  Chest pain. EXAM: NUCLEAR MEDICINE PERFUSION LUNG SCAN TECHNIQUE: Perfusion images were obtained in multiple projections after intravenous injection of radiopharmaceutical. Ventilation scans intentionally deferred if perfusion scan and chest x-ray adequate for interpretation during COVID 19 epidemic. RADIOPHARMACEUTICALS:  4 mCi Tc-87mMAA IV COMPARISON:  Chest radiograph earlier today. FINDINGS: Homogeneous perfusion throughout both lungs. No peripheral or wedge-shaped defects to suggest pulmonary embolus. IMPRESSION: No scintigraphic evidence of pulmonary embolus. Electronically Signed   By: MKeith RakeM.D.   On: 07/21/2022 17:19   DG Knee Complete 4 Views Right  Result Date: 07/17/2022 CLINICAL DATA:  RIGHT knee pain EXAM: RIGHT KNEE - COMPLETE 4+ VIEW COMPARISON:  None Available. FINDINGS: Osseous alignment is within normal limits. No fracture line or displaced fracture fragment. No acute-appearing cortical irregularity or osseous lesion. Tricompartmental degenerative osteoarthritic changes, mild to moderate in degree with joint space narrowing and mild osseous spurring. Faint calcifications within the medial and lateral compartments,  compatible with associated chondrocalcinosis. Probable small joint effusion within the suprapatellar bursa. Extensive atherosclerosis of the superficial femoral artery. Superficial soft tissues about the RIGHT knee are otherwise unremarkable. IMPRESSION: 1. No acute-appearing osseous abnormality. 2. Probable small joint effusion. 3. Tricompartmental degenerative osteoarthritic changes, mild to moderate in degree. 4. Chondrocalcinosis within the medial and lateral compartments, compatible with underlying CPPD. 5. Extensive atherosclerosis of the superficial femoral artery. Electronically Signed   By: SFranki CabotM.D.   On: 07/30/2022 10:41   DG Foot Complete Right  Result Date: 07/30/2022 CLINICAL DATA:  RIGHT foot pain EXAM: RIGHT FOOT COMPLETE - 3+ VIEW COMPARISON:  None Available. FINDINGS: No acute-appearing osseous abnormality. No fracture line or displaced fracture fragment is seen, although osteopenia limits characterization of osseous detail. Soft tissues about the RIGHT w foot are unremarkable. Degenerative osteoarthritic changes at the first MTP joint, at least moderate in degree with joint space loss and articular surface sclerosis. Milder degenerative changes are seen at the TMT, interphalangeal joint spaces and midfoot. IMPRESSION: 1. No acute findings. 2. Degenerative osteoarthritic changes, as detailed above. 3. Osteopenia. Electronically Signed   By: SFranki CabotM.D.   On: 07/29/2022 10:39   DG Chest Port 1 View  Result Date: 07/26/2022 CLINICAL DATA:  Chest pain and wheezing since yesterday. EXAM: PORTABLE CHEST 1 VIEW COMPARISON:  Chest x-rays dated 05/17/2021 and 02/05/2020 FINDINGS: Coarse lung markings are seen bilaterally. Increased interstitial markings are seen throughout both lungs. More confluent opacity at the LEFT lung base. Probable small LEFT pleural effusion. No pneumothorax is seen. Heart size and mediastinal contours are stable. IMPRESSION: 1. Increased interstitial  markings throughout both lungs, most likely pulmonary edema and/or atypical pneumonia superimposed on chronic interstitial lung disease, alternatively a rapidly worsening chronic interstitial lung disease. 2. More confluent opacity at the LEFT lung base, atelectasis versus pneumonia. 3. Probable small LEFT pleural effusion. Electronically Signed   By: SFranki CabotM.D.   On: 07/29/2022 10:36    Microbiology: Recent Results (from the past 240 hour(s))  Resp panel by RT-PCR (RSV, Flu A&B, Covid) Anterior Nasal Swab  Status: None   Collection Time: 07/17/2022  9:12 AM   Specimen: Anterior Nasal Swab  Result Value Ref Range Status   SARS Coronavirus 2 by RT PCR NEGATIVE NEGATIVE Final    Comment: (NOTE) SARS-CoV-2 target nucleic acids are NOT DETECTED.  The SARS-CoV-2 RNA is generally detectable in upper respiratory specimens during the acute phase of infection. The lowest concentration of SARS-CoV-2 viral copies this assay can detect is 138 copies/mL. A negative result does not preclude SARS-Cov-2 infection and should not be used as the sole basis for treatment or other patient management decisions. A negative result may occur with  improper specimen collection/handling, submission of specimen other than nasopharyngeal swab, presence of viral mutation(s) within the areas targeted by this assay, and inadequate number of viral copies(<138 copies/mL). A negative result must be combined with clinical observations, patient history, and epidemiological information. The expected result is Negative.  Fact Sheet for Patients:  EntrepreneurPulse.com.au  Fact Sheet for Healthcare Providers:  IncredibleEmployment.be  This test is no t yet approved or cleared by the [place] FDA and  has been authorized for detection and/or diagnosis of SARS-CoV-2 by FDA under an Emergency Use Authorization (EUA). This EUA will remain  in effect (meaning this test can be  used) for the duration of the COVID-19 declaration under Section 564(b)(1) of the Act, 21 U.S.C.section 360bbb-3(b)(1), unless the authorization is terminated  or revoked sooner.       Influenza A by PCR NEGATIVE NEGATIVE Final   Influenza B by PCR NEGATIVE NEGATIVE Final    Comment: (NOTE) The Xpert Xpress SARS-CoV-2/FLU/RSV plus assay is intended as an aid in the diagnosis of influenza from Nasopharyngeal swab specimens and should not be used as a sole basis for treatment. Nasal washings and aspirates are unacceptable for Xpert Xpress SARS-CoV-2/FLU/RSV testing.  Fact Sheet for Patients: EntrepreneurPulse.com.au  Fact Sheet for Healthcare Providers: IncredibleEmployment.be  This test is not yet approved or cleared by the [place] FDA and has been authorized for detection and/or diagnosis of SARS-CoV-2 by FDA under an Emergency Use Authorization (EUA). This EUA will remain in effect (meaning this test can be used) for the duration of the COVID-19 declaration under Section 564(b)(1) of the Act, 21 U.S.C. section 360bbb-3(b)(1), unless the authorization is terminated or revoked.     Resp Syncytial Virus by PCR NEGATIVE NEGATIVE Final    Comment: (NOTE) Fact Sheet for Patients: EntrepreneurPulse.com.au  Fact Sheet for Healthcare Providers: IncredibleEmployment.be  This test is not yet approved or cleared by the [place] FDA and has been authorized for detection and/or diagnosis of SARS-CoV-2 by FDA under an Emergency Use Authorization (EUA). This EUA will remain in effect (meaning this test can be used) for the duration of the COVID-19 declaration under Section 564(b)(1) of the Act, 21 U.S.C. section 360bbb-3(b)(1), unless the authorization is terminated or revoked.  Performed at [facility], [address] [place], [place] [address]   MRSA Next Gen by PCR, Nasal      Status: None   Collection Time: 08/11/22 11:57 PM   Specimen: Nasal Mucosa; Nasal Swab  Result Value Ref Range Status   MRSA by PCR Next Gen NOT DETECTED NOT DETECTED Final    Comment: (NOTE) The GeneXpert MRSA Assay (FDA approved for NASAL specimens only), is one component of a comprehensive MRSA colonization surveillance program. It is not intended to diagnose MRSA infection nor to guide or monitor treatment for MRSA infections. Test performance is not FDA approved in patients less  than 65 years old. Performed at [facility], [place] [address], [place], [place] [address]     Time spent: 25 minutes  Signed: Annita Brod, MD 09-17-2022

## 2022-09-15 NOTE — Progress Notes (Signed)
Patient complains of chest pain 6/10. EKG is done. Patient is receiving nitroglycerin now. MD notified see new orders

## 2022-09-15 NOTE — Procedures (Signed)
Intubation Procedure Note  Tracy Peters  [id number]  1934/05/05  Date:2022/08/23  Time:2:50 AM   Provider Performing:Vanice Rappa, Belva Chimes A    Procedure: Intubation ([id number])  Indication(s) Respiratory Failure  Consent Unable to obtain consent due to emergent nature of procedure.   Anesthesia None; cardiac arrest   Time Out Verified patient identification, verified procedure, site/side was marked, verified correct patient position, special equipment/implants available, medications/allergies/relevant history reviewed, required imaging and test results available.   Sterile Technique Usual hand hygeine, masks, and gloves were used   Procedure Description Patient positioned in bed supine.  Sedation given as noted above.  Patient was intubated with endotracheal tube using  MAC 3 .  View was Grade 1 full glottis .  Number of attempts was 1.  Colorimetric CO2 detector was consistent with tracheal placement.   Complications/Tolerance None; patient tolerated the procedure well. Chest X-ray is ordered to verify placement.   EBL Minimal   Specimen(s) None

## 2022-09-15 NOTE — Progress Notes (Signed)
Patient is resting with eyes closed. Upon waking patient to assess pain level. Patient denies any chest pain. Son is present at bedside.

## 2022-09-15 NOTE — Progress Notes (Signed)
eLink Physician-Brief Progress Note Patient Name: Jupiter Pigman DOB: [birth year] MRN: [id number]   Date of Service  08-29-2022  HPI/Events of Note  89 year old female that initially presented with acute pelvic bleed in the setting of systemic anticoagulation with Eliquis with aortic stenosis associated heart failure with preserved ejection fraction.   eICU Interventions  Cardiac arrest called at 0241.  Patient was noted to be in asystole, compressions had been started, were replaced, bag mask valve ventilation was initiated.  No new medications, no evidence of hyperkalemia, hypoxemia, or acidosis.  Ground team arrived at bedside and assumed care.  Ground team intubated the patient and called family.     Intervention Category Major Interventions: Code management / supervision  Arianis Bowditch Aug 29, 2022, 2:46 AM

## 2022-09-15 NOTE — Code Documentation (Signed)
  Patient Name: Tracy Peters   MRN: [id number]   Date of Birth/ Sex: [birth year] , female      Admission Date: 08/02/2022  Attending Provider: Annita Brod, MD  Primary Diagnosis: Elevated troponin [R79.89] NSTEMI (non-ST elevated myocardial infarction) ([place]) [I21.4] Elevated d-dimer [R79.89] Pneumonia due to infectious organism, unspecified laterality, unspecified part of lung [J18.9] Acute congestive heart failure, unspecified heart failure type ([place]) [I50.9]   Indication: Pt was in her usual state of health until this AM, when she was noted to be bradycardia/unresponsive then found to be in asystole. Code blue was subsequently called. At the time of arrival on scene, ACLS protocol was underway.   Technical Description:  - CPR performance duration:  18 minutes  - Was defibrillation or cardioversion used? No   - Was external pacer placed? No  - Was patient intubated pre/post CPR? Yes   Medications Administered: Y = Yes; Blank = No Amiodarone    Atropine    Calcium    Epinephrine  Y  Lidocaine    Magnesium    Norepinephrine    Phenylephrine    Sodium bicarbonate  Y  Vasopressin    Other    Post CPR evaluation:  - Final Status - Was patient successfully resuscitated ? No   Miscellaneous Information:  - Time of death:  02:57 AM  - Primary team notified?  Yes  - Family Notified? Yes     Angelique Blonder, DO   08-26-2022, 3:03 AM

## 2022-09-15 NOTE — Progress Notes (Signed)
Chaplain responded to Code Blue.  No family present.  Chaplain not needed at this time.   [place]

## 2022-09-15 DEATH — deceased
